# Patient Record
Sex: Female | Born: 1940
Health system: Southern US, Community
[De-identification: ages and names within clinical notes are randomized; demographics above are authoritative.]

## PROBLEM LIST (undated history)

## (undated) DIAGNOSIS — E785 Hyperlipidemia, unspecified: Secondary | ICD-10-CM

## (undated) DIAGNOSIS — K5790 Diverticulosis of intestine, part unspecified, without perforation or abscess without bleeding: Secondary | ICD-10-CM

## (undated) DIAGNOSIS — J309 Allergic rhinitis, unspecified: Secondary | ICD-10-CM

## (undated) DIAGNOSIS — Z800 Family history of malignant neoplasm of digestive organs: Secondary | ICD-10-CM

## (undated) DIAGNOSIS — M1611 Unilateral primary osteoarthritis, right hip: Secondary | ICD-10-CM

## (undated) DIAGNOSIS — K219 Gastro-esophageal reflux disease without esophagitis: Secondary | ICD-10-CM

## (undated) DIAGNOSIS — K449 Diaphragmatic hernia without obstruction or gangrene: Secondary | ICD-10-CM

## (undated) DIAGNOSIS — K52831 Collagenous colitis: Secondary | ICD-10-CM

## (undated) DIAGNOSIS — M1990 Unspecified osteoarthritis, unspecified site: Secondary | ICD-10-CM

## (undated) DIAGNOSIS — S72001A Fracture of unspecified part of neck of right femur, initial encounter for closed fracture: Secondary | ICD-10-CM

## (undated) HISTORY — PX: LEG SURGERY: SHX1003

## (undated) HISTORY — PX: TONSILLECTOMY: SUR1361

## (undated) HISTORY — DX: Diaphragmatic hernia without obstruction or gangrene: K44.9

## (undated) HISTORY — DX: Family history of malignant neoplasm of digestive organs: Z80.0

## (undated) HISTORY — PX: CHOLECYSTECTOMY: SHX55

## (undated) HISTORY — DX: Allergic rhinitis, unspecified: J30.9

## (undated) HISTORY — DX: Hyperlipidemia, unspecified: E78.5

---

## 1998-10-10 ENCOUNTER — Other Ambulatory Visit: Admission: RE | Admit: 1998-10-10 | Discharge: 1998-10-10 | Payer: Self-pay | Admitting: Gynecology

## 1999-09-04 ENCOUNTER — Ambulatory Visit (HOSPITAL_COMMUNITY): Admission: RE | Admit: 1999-09-04 | Discharge: 1999-09-04 | Payer: Self-pay | Admitting: Gastroenterology

## 1999-10-22 ENCOUNTER — Other Ambulatory Visit: Admission: RE | Admit: 1999-10-22 | Discharge: 1999-10-22 | Payer: Self-pay | Admitting: Gynecology

## 2001-09-30 ENCOUNTER — Other Ambulatory Visit: Admission: RE | Admit: 2001-09-30 | Discharge: 2001-09-30 | Payer: Self-pay | Admitting: Gynecology

## 2002-01-19 ENCOUNTER — Encounter: Payer: Self-pay | Admitting: Orthopedic Surgery

## 2002-01-19 ENCOUNTER — Ambulatory Visit (HOSPITAL_COMMUNITY): Admission: RE | Admit: 2002-01-19 | Discharge: 2002-01-20 | Payer: Self-pay | Admitting: Orthopedic Surgery

## 2004-08-14 ENCOUNTER — Other Ambulatory Visit: Admission: RE | Admit: 2004-08-14 | Discharge: 2004-08-14 | Payer: Self-pay | Admitting: Family Medicine

## 2005-11-24 ENCOUNTER — Encounter: Admission: RE | Admit: 2005-11-24 | Discharge: 2005-11-24 | Payer: Self-pay | Admitting: Orthopedic Surgery

## 2007-08-20 ENCOUNTER — Ambulatory Visit: Payer: Self-pay | Admitting: Vascular Surgery

## 2007-10-05 ENCOUNTER — Other Ambulatory Visit: Admission: RE | Admit: 2007-10-05 | Discharge: 2007-10-05 | Payer: Self-pay | Admitting: Family Medicine

## 2010-06-24 ENCOUNTER — Encounter: Admission: RE | Admit: 2010-06-24 | Discharge: 2010-06-24 | Payer: Self-pay | Admitting: Gastroenterology

## 2010-12-24 NOTE — Procedures (Signed)
CAROTID DUPLEX EXAM   INDICATION:  Left-sided numbness in face 4 days ago as well as about 4  weeks ago.   HISTORY:  Diabetes:  No.  Cardiac:  No.  Hypertension:  No.  Smoking:  Quit.  Previous Surgery:  No.  CV History:  Asymptomatic now.  Amaurosis Fugax No, Paresthesias No, Hemiparesis No                                       RIGHT             LEFT  Brachial systolic pressure:         112               110  Brachial Doppler waveforms:         Triphasic         Triphasic  Vertebral direction of flow:        Antegrade         Antegrade  DUPLEX VELOCITIES (cm/sec)  CCA peak systolic                   110               124  ECA peak systolic                   94                80  ICA peak systolic                   99                89  ICA end diastolic                   34                36  PLAQUE MORPHOLOGY:                  Mixed             None  PLAQUE AMOUNT:                      Mild              None  PLAQUE LOCATION:                    Bifurcation       none   IMPRESSION:  1. The right ICA shows evidence of 1-19% stenosis.  2. Left ICA shows no evidence of stenosis.   ___________________________________________  Quita Skye Hart [place], M.D.   AS/MEDQ  D:  08/20/2007  T:  08/20/2007  Job:  [id number]   cc:   Otilio Connors. Gerri Spore, M.D.

## 2010-12-27 NOTE — Op Note (Signed)
[place]. [facility]  Patient:    Nicole Keith, Nicole Keith Visit Number: [id number] MRN: [id number]          Service Type: DSU Location: [id number] Attending Physician:  Georgena Spurling Dictated by:   Georgena Spurling, M.D. Proc. Date: 01/19/02 Admit Date:  01/19/2002 Discharge Date: 01/20/2002                             Operative Report  4PREOPERATIVE DIAGNOSIS:  Left tibial plateau fracture.  POSTOPERATIVE DIAGNOSIS:  Left tibial plateau fracture.  OPERATION:  Left tibia open reduction and internal fixation.  SURGEON:  Norton Pastel, M.D.  ASSISTANT:  Jamelle Rushing, P.A.  ANESTHESIA:  General  INDICATION FOR PROCEDURE:  The patient is four days status post a fall and she was diagnosed with a tibial plateau fracture in [facility] ER and sent to me in the office yesterday.  She was placed on the elective schedule for today after informed consent was obtained.  DESCRIPTION OF PROCEDURE:  The patient was placed supine, administered general endotracheal anesthesia.  The left lower extremity was prepped and draped in the usual sterile fashion.  A Hockey stick incision was made approaching the lateral proximal tibia.  We developed a thick soft tissue flap going down through the anterior compartment musculature and dissecting posteriorly.  We then laid a six hole plate that was precontoured plate from [employer] along the lateral aspect of the tibial plateau and used a bone reduction clamp to clamp and give [place] compression.  We then placed three screws approximately 1.5 cm below the articular surface after verifying anatomic reduction in the AP and lateral planes.  We then placed a single metaphyseal screw and three screws in the shaft.  We had anatomic reduction.  We were very pleased with our x-rays. We then irrigated the wound and closed with interrupted 0 Vicryl and interrupted 2-0 Vicryl and skin staples.  I then aspirated the knee joint and filled it  with 15 cc of 0.25% Marcaine mixture and then dressed with Adaptic 4 x 4s, sterile Webril and Ace wrap.  The patient tolerated the procedure well. Complications were none.  Drains were none. Dictated by:   Georgena Spurling, M.D. Attending Physician:  Georgena Spurling DD:  01/19/02 TD:  01/21/02 Job: [id number]

## 2013-01-09 ENCOUNTER — Inpatient Hospital Stay (HOSPITAL_COMMUNITY)
Admission: EM | Admit: 2013-01-09 | Discharge: 2013-01-12 | DRG: 470 | Disposition: A | Discharge status: Skilled Nursing Facility | Payer: Medicare Other | Attending: Internal Medicine | Admitting: Internal Medicine

## 2013-01-09 ENCOUNTER — Emergency Department (HOSPITAL_COMMUNITY): Payer: Medicare Other

## 2013-01-09 ENCOUNTER — Encounter (HOSPITAL_COMMUNITY): Payer: Self-pay

## 2013-01-09 DIAGNOSIS — J309 Allergic rhinitis, unspecified: Secondary | ICD-10-CM | POA: Diagnosis present

## 2013-01-09 DIAGNOSIS — E871 Hypo-osmolality and hyponatremia: Secondary | ICD-10-CM | POA: Diagnosis not present

## 2013-01-09 DIAGNOSIS — Z881 Allergy status to other antibiotic agents status: Secondary | ICD-10-CM

## 2013-01-09 DIAGNOSIS — S72001F Fracture of unspecified part of neck of right femur, subsequent encounter for open fracture type IIIA, IIIB, or IIIC with routine healing: Secondary | ICD-10-CM

## 2013-01-09 DIAGNOSIS — Z8249 Family history of ischemic heart disease and other diseases of the circulatory system: Secondary | ICD-10-CM

## 2013-01-09 DIAGNOSIS — D62 Acute posthemorrhagic anemia: Secondary | ICD-10-CM | POA: Diagnosis not present

## 2013-01-09 DIAGNOSIS — Z888 Allergy status to other drugs, medicaments and biological substances status: Secondary | ICD-10-CM

## 2013-01-09 DIAGNOSIS — M1611 Unilateral primary osteoarthritis, right hip: Secondary | ICD-10-CM | POA: Diagnosis present

## 2013-01-09 DIAGNOSIS — D72829 Elevated white blood cell count, unspecified: Secondary | ICD-10-CM | POA: Diagnosis not present

## 2013-01-09 DIAGNOSIS — M1610 Unilateral primary osteoarthritis, unspecified hip: Secondary | ICD-10-CM | POA: Diagnosis present

## 2013-01-09 DIAGNOSIS — S72033A Displaced midcervical fracture of unspecified femur, initial encounter for closed fracture: Principal | ICD-10-CM | POA: Diagnosis present

## 2013-01-09 DIAGNOSIS — W010XXA Fall on same level from slipping, tripping and stumbling without subsequent striking against object, initial encounter: Secondary | ICD-10-CM | POA: Diagnosis present

## 2013-01-09 DIAGNOSIS — S72001A Fracture of unspecified part of neck of right femur, initial encounter for closed fracture: Secondary | ICD-10-CM

## 2013-01-09 DIAGNOSIS — Z880 Allergy status to penicillin: Secondary | ICD-10-CM

## 2013-01-09 DIAGNOSIS — Z01810 Encounter for preprocedural cardiovascular examination: Secondary | ICD-10-CM

## 2013-01-09 DIAGNOSIS — K5900 Constipation, unspecified: Secondary | ICD-10-CM | POA: Diagnosis not present

## 2013-01-09 DIAGNOSIS — Z7901 Long term (current) use of anticoagulants: Secondary | ICD-10-CM

## 2013-01-09 DIAGNOSIS — Z87891 Personal history of nicotine dependence: Secondary | ICD-10-CM

## 2013-01-09 DIAGNOSIS — M169 Osteoarthritis of hip, unspecified: Secondary | ICD-10-CM | POA: Diagnosis present

## 2013-01-09 DIAGNOSIS — Z7982 Long term (current) use of aspirin: Secondary | ICD-10-CM

## 2013-01-09 HISTORY — DX: Fracture of unspecified part of neck of right femur, initial encounter for closed fracture: S72.001A

## 2013-01-09 HISTORY — DX: Collagenous colitis: K52.831

## 2013-01-09 HISTORY — DX: Diverticulosis of intestine, part unspecified, without perforation or abscess without bleeding: K57.90

## 2013-01-09 HISTORY — DX: Gastro-esophageal reflux disease without esophagitis: K21.9

## 2013-01-09 HISTORY — DX: Unspecified osteoarthritis, unspecified site: M19.90

## 2013-01-09 HISTORY — DX: Unilateral primary osteoarthritis, right hip: M16.11

## 2013-01-09 LAB — URINALYSIS, ROUTINE W REFLEX MICROSCOPIC
Bilirubin Urine: NEGATIVE
Glucose, UA: NEGATIVE mg/dL
Hgb urine dipstick: NEGATIVE
Ketones, ur: NEGATIVE mg/dL
Leukocytes, UA: NEGATIVE
Nitrite: NEGATIVE
Protein, ur: NEGATIVE mg/dL
Specific Gravity, Urine: 1.012 (ref 1.005–1.030)
Urobilinogen, UA: 0.2 mg/dL (ref 0.0–1.0)
pH: 7 (ref 5.0–8.0)

## 2013-01-09 LAB — CBC
HCT: 38.8 % (ref 36.0–46.0)
Hemoglobin: 13.1 g/dL (ref 12.0–15.0)
MCH: 31.3 pg (ref 26.0–34.0)
MCHC: 33.8 g/dL (ref 30.0–36.0)
MCV: 92.8 fL (ref 78.0–100.0)
Platelets: 318 10*3/uL (ref 150–400)
RBC: 4.18 MIL/uL (ref 3.87–5.11)
RDW: 13.5 % (ref 11.5–15.5)
WBC: 15.8 10*3/uL — ABNORMAL HIGH (ref 4.0–10.5)

## 2013-01-09 LAB — BASIC METABOLIC PANEL
BUN: 12 mg/dL (ref 6–23)
CO2: 24 mEq/L (ref 19–32)
Calcium: 9 mg/dL (ref 8.4–10.5)
Chloride: 98 mEq/L (ref 96–112)
Creatinine, Ser: 0.63 mg/dL (ref 0.50–1.10)
GFR calc Af Amer: >90 mL/min (ref >90)
GFR calc non Af Amer: 87 mL/min — ABNORMAL LOW (ref >90)
Glucose, Bld: 177 mg/dL — ABNORMAL HIGH (ref 70–99)
Potassium: 4.2 mEq/L (ref 3.5–5.1)
Sodium: 134 mEq/L — ABNORMAL LOW (ref 135–145)

## 2013-01-09 LAB — PROTIME-INR
INR: 0.94 (ref 0.00–1.49)
Prothrombin Time: 12.5 seconds (ref 11.6–15.2)

## 2013-01-09 LAB — TYPE AND SCREEN
ABO/RH(D): A POS
Antibody Screen: NEGATIVE

## 2013-01-09 LAB — TROPONIN I: Troponin I: <0.3 ng/mL (ref <0.30)

## 2013-01-09 LAB — ABO/RH: ABO/RH(D): A POS

## 2013-01-09 MED ORDER — ONDANSETRON HCL 4 MG PO TABS: 4.0000 mg | ORAL_TABLET | Freq: Four times a day (QID) | ORAL | Status: DC | PRN

## 2013-01-09 MED ORDER — ENOXAPARIN SODIUM 40 MG/0.4ML ~~LOC~~ SOLN
40.0000 mg | SUBCUTANEOUS | Status: DC
  Administered 2013-01-09: 40 mg via SUBCUTANEOUS
  Filled 2013-01-09 (×2): qty 0.4

## 2013-01-09 MED ORDER — DOCUSATE SODIUM 100 MG PO CAPS
100.0000 mg | ORAL_CAPSULE | Freq: Two times a day (BID) | ORAL | Status: DC
  Administered 2013-01-09 – 2013-01-12 (×4): 100 mg via ORAL
  Filled 2013-01-09 (×7): qty 1

## 2013-01-09 MED ORDER — SODIUM CHLORIDE 0.9 % IJ SOLN: 3.0000 mL | Freq: Two times a day (BID) | INTRAMUSCULAR | Status: DC

## 2013-01-09 MED ORDER — SODIUM CHLORIDE 0.9 % IV SOLN: 250.0000 mL | INTRAVENOUS | Status: DC | PRN

## 2013-01-09 MED ORDER — ACETAMINOPHEN 650 MG RE SUPP
650.0000 mg | Freq: Four times a day (QID) | RECTAL | Status: DC | PRN
  Filled 2013-01-09: qty 1

## 2013-01-09 MED ORDER — FENTANYL CITRATE 0.05 MG/ML IJ SOLN
25.0000 ug | Freq: Once | INTRAMUSCULAR | Status: AC
  Administered 2013-01-09: 25 ug via INTRAVENOUS
  Filled 2013-01-09: qty 2

## 2013-01-09 MED ORDER — SODIUM CHLORIDE 0.9 % IV SOLN
INTRAVENOUS | Status: AC
  Administered 2013-01-10: 04:00:00 via INTRAVENOUS

## 2013-01-09 MED ORDER — ONDANSETRON HCL 4 MG/2ML IJ SOLN: 4.0000 mg | Freq: Four times a day (QID) | INTRAMUSCULAR | Status: DC | PRN

## 2013-01-09 MED ORDER — ACETAMINOPHEN 325 MG PO TABS: 650.0000 mg | ORAL_TABLET | Freq: Four times a day (QID) | ORAL | Status: DC | PRN

## 2013-01-09 MED ORDER — SODIUM CHLORIDE 0.9 % IJ SOLN: 3.0000 mL | INTRAMUSCULAR | Status: DC | PRN

## 2013-01-09 MED ORDER — HYDROCODONE-ACETAMINOPHEN 5-325 MG PO TABS
1.0000 | ORAL_TABLET | ORAL | Status: DC | PRN
  Administered 2013-01-09 (×2): 1 via ORAL
  Administered 2013-01-10 (×2): 2 via ORAL
  Filled 2013-01-09: qty 2
  Filled 2013-01-09: qty 1
  Filled 2013-01-09: qty 2
  Filled 2013-01-09: qty 1

## 2013-01-09 NOTE — ED Provider Notes (Signed)
History     CSN: [id number]  Arrival date & time 01/09/13  1554   First MD Initiated Contact with Patient 01/09/13 1652      Chief Complaint  Patient presents with  . hip fracture     (Consider location/radiation/quality/duration/timing/severity/associated sxs/prior treatment) The history is provided by the patient.  Nicole Keith is a 72 y.o. female hx of arthritis here with R hip pain s/p fall. She was walking out of her car and then tripped on the sidewalk and landed on the right hip. Denies any syncope or loss of consciousness or head injury. She was unable to walk afterwards and went to urgent care and had an x-ray that showed femoral neck fracture. She says she has arthritis in the hip before. She saw Dr. Sherlean Keith (ortho) previously.    Past Medical History  Diagnosis Date  . Colitis, collagenous     Past Surgical History  Procedure Laterality Date  . Leg surgery Left   . Tonsillectomy    . Cholecystectomy      History reviewed. No pertinent family history.  History  Substance Use Topics  . Smoking status: Former Smoker    Quit date: 01/10/1972  . Smokeless tobacco: Not on file  . Alcohol Use: No    OB History   Grav Para Term Preterm Abortions TAB SAB Ect Mult Living                  Review of Systems  Musculoskeletal:       R hip pain   All other systems reviewed and are negative.    Allergies  Valium; Betadine; Doxycycline; and Penicillins  Home Medications   Current Outpatient Rx  Name  Route  Sig  Dispense  Refill  . aspirin (ASPIR-81) 81 MG EC tablet   Oral   Take 81 mg by mouth daily. Swallow whole.         . Cholecalciferol (VITAMIN D) 2000 UNITS CAPS   Oral   Take 2,000 Units by mouth daily.         [name] Kitchen ibuprofen (ADVIL,MOTRIN) 200 MG tablet   Oral   Take 400 mg by mouth every 6 (six) hours as needed for pain.         . Loratadine (CLARITIN PO)   Oral   Take 10 mg by mouth daily.           BP 158/76  Pulse 82   Temp(Src) 98.6 F (37 C) (Oral)  Resp 18  Ht 5\' 7"  (1.702 m)  Wt 142 lb (64.411 kg)  BMI 22.24 kg/m2  SpO2 97%  Physical Exam  Nursing note and vitals reviewed. Constitutional: She is oriented to person, place, and time. She appears well-developed and well-nourished.  Uncomfortable   HENT:  Head: Normocephalic.  Mouth/Throat: Oropharynx is clear and moist.  Eyes: Conjunctivae are normal. Pupils are equal, round, and reactive to light.  Neck: Normal range of motion. Neck supple.  Cardiovascular: Normal rate, regular rhythm and normal heart sounds.   Pulmonary/Chest: Effort normal and breath sounds normal. No respiratory distress. She has no wheezes. She has no rales.  Abdominal: Soft. Bowel sounds are normal. She exhibits no distension. There is no tenderness. There is no rebound and no guarding.  Musculoskeletal:  Pelvis stable. R leg shortened and externally rotated. Good pulses. Able to plantar flex.   Neurological: She is alert and oriented to person, place, and time.  Skin: Skin is warm and dry.  Psychiatric: She  has a normal mood and affect. Her behavior is normal. Judgment and thought content normal.    ED Course  Procedures (including critical care time)  Labs Reviewed  CBC - Abnormal; Notable for the following:    WBC 15.8 (*)    All other components within normal limits  BASIC METABOLIC PANEL - Abnormal; Notable for the following:    Sodium 134 (*)    Glucose, Bld 177 (*)    GFR calc non Af Amer 87 (*)    All other components within normal limits  PROTIME-INR  URINALYSIS, ROUTINE W REFLEX MICROSCOPIC  TYPE AND SCREEN   Dg Chest 1 View  01/09/2013   *RADIOLOGY REPORT*  Clinical Data: Right hip pain status post fall.  History of osteoarthritis.  CHEST - 1 VIEW  Comparison: None.  Findings: The heart size and mediastinal contours are normal.  The lungs appear clear.  There is a probable EKG snap overlying the upper right chest.  No pleural effusion, pneumothorax or  acute fracture is identified.  Cholecystectomy clips are noted.  IMPRESSION: No active cardiopulmonary process.   Original Report Authenticated By: Carey Bullocks, M.D.   Dg Hip Complete Right  01/09/2013   *RADIOLOGY REPORT*  Clinical Data: Right hip pain and deformity status post fall. History of osteoarthritis.  RIGHT HIP - COMPLETE 2+ VIEW  Comparison: Radiographs 01/09/2013 and MRI 01/27/2006.  Findings: Again demonstrated is a nondisplaced subcapital fracture of the right femoral neck. There is asymmetric right hip osteoarthritis with joint space loss and osteophytes.  Mild left hip degenerative changes are noted.  There is no evidence of acute pelvic fracture or dislocation.  IMPRESSION: Unchanged subcapital fracture of the right femoral neck.   Original Report Authenticated By: Carey Bullocks, M.D.     No diagnosis found.   Date: 01/09/2013  Rate: 83  Rhythm: normal sinus rhythm  QRS Axis: normal  Intervals: normal  ST/T Wave abnormalities: normal  Conduction Disutrbances:none  Narrative Interpretation:   Old EKG Reviewed: unchanged    MDM  Nicole Keith is a 72 y.o. female here with R hip fracture. Xray confirmed R femoral neck fracture. Labs showed WBC 16. CXR nl, will get UA. I called Dr. Pecolia Ades, who wants patient NPO. Will admit to triad under Dr. Susie Cassette.          Richardean Canal, MD 01/09/13 [phone]

## 2013-01-09 NOTE — ED Notes (Addendum)
Pt was getting out of car at a restaurant stepping off a curb and fell onto RT hip.  Also has abrasion to RT palm and soreness to RT shoulder.  Went to [employer] and dx'd w/non-displaced RT neck hip fx.  + CMST to RLE.  Pt states she took 400mg  ibuprofen about 1:30pm today (approx after it happened)

## 2013-01-09 NOTE — H&P (Addendum)
[facility] History and Physical  Nicole Keith ZOX:[id number] DOB: Dec 28, 1940 DOA: 01/09/2013  Referring physician: ED PCP: No primary provider on file.   Chief Complaint: hip fracture    HPI:  72 y.o. female hx of arthritis here with R hip pain s/p fall. She was walking out of her car and then tripped on the sidewalk and landed on the right hip. Denies any syncope or loss of consciousness or head injury. She was unable to walk afterwards and went to urgent care and had an x-ray that showed femoral neck fracture. She says she has arthritis in the hip before. Pt states she took 400mg  ibuprofen about 1:30pm today (approx after it happened) She denies any hx of CAD ,chest pain,chf ,a fib. She has a history of collagenous colitis, which is currently quiencet. She has never had a stress test. She states that she is fairly active and occasionally goes hiking      Review of Systems: negative for the following  Constitutional: Denies fever, chills, diaphoresis, appetite change and fatigue.  HEENT: Denies photophobia, eye pain, redness, hearing loss, ear pain, congestion, sore throat, rhinorrhea, sneezing, mouth sores, trouble swallowing, neck pain, neck stiffness and tinnitus.  Respiratory: Denies SOB, DOE, cough, chest tightness, and wheezing.  Cardiovascular: Denies chest pain, palpitations and leg swelling.  Gastrointestinal: Denies nausea, vomiting, abdominal pain, diarrhea, constipation, blood in stool and abdominal distention.  Genitourinary: Denies dysuria, urgency, frequency, hematuria, flank pain and difficulty urinating.  Musculoskeletal: R hip pain  Denies myalgias, back pain, joint swelling, arthralgias and gait problem.  Skin: Denies pallor, rash and wound.  Neurological: Denies dizziness, seizures, syncope, weakness, light-headedness, numbness and headaches.  Hematological: Denies adenopathy. Easy bruising, personal or family bleeding history   Psychiatric/Behavioral: Denies suicidal ideation, mood changes, confusion, nervousness, sleep disturbance and agitation       Past Medical History  Diagnosis Date  . Colitis, collagenous      Past Surgical History  Procedure Laterality Date  . Leg surgery Left   . Tonsillectomy    . Cholecystectomy        Social History:  reports that she quit smoking about 41 years ago. She does not have any smokeless tobacco history on file. She reports that she does not drink alcohol or use illicit drugs.    Allergies  Allergen Reactions  . Valium (Diazepam) Other (See Comments)  . Betadine (Povidone Iodine) Rash  . Doxycycline Rash  . Penicillins Rash    History reviewed. No pertinent family history.   Prior to Admission medications   Medication Sig Start Date End Date Taking? Authorizing Provider  aspirin (ASPIR-81) 81 MG EC tablet Take 81 mg by mouth daily. Swallow whole.   Yes Historical Provider, MD  Cholecalciferol (VITAMIN D) 2000 UNITS CAPS Take 2,000 Units by mouth daily.   Yes Historical Provider, MD  ibuprofen (ADVIL,MOTRIN) 200 MG tablet Take 400 mg by mouth every 6 (six) hours as needed for pain.   Yes Historical Provider, MD  Loratadine (CLARITIN PO) Take 10 mg by mouth daily.   Yes Historical Provider, MD     Physical Exam: Filed Vitals:   01/09/13 1600  BP: 158/76  Pulse: 82  Temp: 98.6 F (37 C)  TempSrc: Oral  Resp: 18  Height: 5\' 7"  (1.702 m)  Weight: 64.411 kg (142 lb)  SpO2: 97%     Constitutional: Vital signs reviewed. Patient is a well-developed and well-nourished in no acute distress and cooperative with exam. Alert and oriented  x3.  Head: Normocephalic and atraumatic  Ear: TM normal bilaterally  Mouth: no erythema or exudates, MMM  Eyes: PERRL, EOMI, conjunctivae normal, No scleral icterus.  Neck: Supple, Trachea midline normal ROM, No JVD, mass, thyromegaly, or carotid bruit present.  Cardiovascular: RRR, S1 normal, S2 normal, no MRG,  pulses symmetric and intact bilaterally  Pulmonary/Chest: CTAB, no wheezes, rales, or rhonchi  Abdominal: Soft. Non-tender, non-distended, bowel sounds are normal, no masses, organomegaly, or guarding present.  GU: no CVA tenderness Musculoskeletal: No joint deformities, erythema, or stiffness, ROM full and no nontender Pelvis stable. R leg shortened and externally rotated. Good pulses. Able to plantar flex Ext: no edema and no cyanosis, pulses palpable bilaterally (DP and PT)  Hematology: no cervical, inginal, or axillary adenopathy.  Neurological: A&O x3, Strenght is normal and symmetric bilaterally, cranial nerve II-XII are grossly intact, no focal motor deficit, sensory intact to light touch bilaterally.  Skin: Warm, dry and intact. No rash, cyanosis, or clubbing.  Psychiatric: Normal mood and affect. speech and behavior is normal. Judgment and thought content normal. Cognition and memory are normal.       Labs on Admission:    Basic Metabolic Panel:  Recent Labs Lab 01/09/13 1630  NA 134*  K 4.2  CL 98  CO2 24  GLUCOSE 177*  BUN 12  CREATININE 0.63  CALCIUM 9.0   Liver Function Tests: No results found for this basename: AST, ALT, ALKPHOS, BILITOT, PROT, ALBUMIN,  in the last 168 hours No results found for this basename: LIPASE, AMYLASE,  in the last 168 hours No results found for this basename: AMMONIA,  in the last 168 hours CBC:  Recent Labs Lab 01/09/13 1630  WBC 15.8*  HGB 13.1  HCT 38.8  MCV 92.8  PLT 318   Cardiac Enzymes: No results found for this basename: CKTOTAL, CKMB, CKMBINDEX, TROPONINI,  in the last 168 hours  BNP (last 3 results) No results found for this basename: PROBNP,  in the last 8760 hours    CBG: No results found for this basename: GLUCAP,  in the last 168 hours  Radiological Exams on Admission: Dg Chest 1 View  01/09/2013   *RADIOLOGY REPORT*  Clinical Data: Right hip pain status post fall.  History of osteoarthritis.  CHEST - 1  VIEW  Comparison: None.  Findings: The heart size and mediastinal contours are normal.  The lungs appear clear.  There is a probable EKG snap overlying the upper right chest.  No pleural effusion, pneumothorax or acute fracture is identified.  Cholecystectomy clips are noted.  IMPRESSION: No active cardiopulmonary process.   Original Report Authenticated By: Carey Bullocks, M.D.   Dg Hip Complete Right  01/09/2013   *RADIOLOGY REPORT*  Clinical Data: Right hip pain and deformity status post fall. History of osteoarthritis.  RIGHT HIP - COMPLETE 2+ VIEW  Comparison: Radiographs 01/09/2013 and MRI 01/27/2006.  Findings: Again demonstrated is a nondisplaced subcapital fracture of the right femoral neck. There is asymmetric right hip osteoarthritis with joint space loss and osteophytes.  Mild left hip degenerative changes are noted.  There is no evidence of acute pelvic fracture or dislocation.  IMPRESSION: Unchanged subcapital fracture of the right femoral neck.   Original Report Authenticated By: Carey Bullocks, M.D.    EKG: Independently reviewed. Date: 01/09/2013  Rate: 83  Rhythm: normal sinus rhythm  QRS Axis: normal  Intervals: normal  ST/T Wave abnormalities: normal  Conduction Disutrbances:none  Narrative Interpretation:  Old EKG Reviewed: unchanged  Assessment/Plan Principal Problem:   Hip fracture Dr Madelon Lips aware  No cardiopulmonary sx to suggest any contraindications for surgery However given her age she is still at moderate risk of cardiac/ neurologic complications  She understands the risk and wants to proceed Will do a 2 d echo in am to ensure stability at baseline She needs to be transferred to [facility] in the morning per Dr. Candise Bowens request. She wants surgery to be done by Dr. Valentina Gu   Leukocytosis Negative workup in the ER Likely stress margination   Code Status:   full Family Communication: bedside Disposition Plan: admit   Time spent: 70 mins    [facility] Pager [phone]  If 7PM-7AM, please contact night-coverage [email or web address] Password [facility] 01/09/2013, 7:12 PM

## 2013-01-09 NOTE — ED Notes (Signed)
Pt has been voiding in bedpan and would like to hold off on foley catheter insertion until it is absolutely necessary.

## 2013-01-10 ENCOUNTER — Encounter (HOSPITAL_COMMUNITY)
Admission: EM | Disposition: A | Discharge status: Skilled Nursing Facility | Payer: Self-pay | Source: Home / Self Care | Attending: Internal Medicine

## 2013-01-10 ENCOUNTER — Inpatient Hospital Stay (HOSPITAL_COMMUNITY): Payer: Medicare Other

## 2013-01-10 ENCOUNTER — Encounter (HOSPITAL_COMMUNITY): Payer: Self-pay | Admitting: *Deleted

## 2013-01-10 ENCOUNTER — Inpatient Hospital Stay (HOSPITAL_COMMUNITY): Payer: Medicare Other | Admitting: Anesthesiology

## 2013-01-10 ENCOUNTER — Encounter (HOSPITAL_COMMUNITY): Payer: Self-pay | Admitting: Anesthesiology

## 2013-01-10 DIAGNOSIS — M1611 Unilateral primary osteoarthritis, right hip: Secondary | ICD-10-CM

## 2013-01-10 DIAGNOSIS — S72009A Fracture of unspecified part of neck of unspecified femur, initial encounter for closed fracture: Secondary | ICD-10-CM

## 2013-01-10 HISTORY — PX: TOTAL HIP ARTHROPLASTY: SHX124

## 2013-01-10 HISTORY — DX: Unilateral primary osteoarthritis, right hip: M16.11

## 2013-01-10 LAB — CREATININE, SERUM
Creatinine, Ser: 0.54 mg/dL (ref 0.50–1.10)
GFR calc Af Amer: >90 mL/min (ref >90)
GFR calc non Af Amer: >90 mL/min (ref >90)

## 2013-01-10 LAB — SURGICAL PCR SCREEN
MRSA, PCR: NEGATIVE
Staphylococcus aureus: POSITIVE — AB

## 2013-01-10 LAB — COMPREHENSIVE METABOLIC PANEL
ALT: 13 U/L (ref 0–35)
AST: 20 U/L (ref 0–37)
Albumin: 2.8 g/dL — ABNORMAL LOW (ref 3.5–5.2)
Alkaline Phosphatase: 60 U/L (ref 39–117)
BUN: 10 mg/dL (ref 6–23)
CO2: 25 mEq/L (ref 19–32)
Calcium: 8.3 mg/dL — ABNORMAL LOW (ref 8.4–10.5)
Chloride: 103 mEq/L (ref 96–112)
Creatinine, Ser: 0.56 mg/dL (ref 0.50–1.10)
GFR calc Af Amer: >90 mL/min (ref >90)
GFR calc non Af Amer: >90 mL/min (ref >90)
Glucose, Bld: 116 mg/dL — ABNORMAL HIGH (ref 70–99)
Potassium: 3.6 mEq/L (ref 3.5–5.1)
Sodium: 135 mEq/L (ref 135–145)
Total Bilirubin: 0.7 mg/dL (ref 0.3–1.2)
Total Protein: 7.9 g/dL (ref 6.0–8.3)

## 2013-01-10 LAB — CBC
HCT: 36.4 % (ref 36.0–46.0)
HCT: 31.8 % — ABNORMAL LOW (ref 36.0–46.0)
Hemoglobin: 12.1 g/dL (ref 12.0–15.0)
Hemoglobin: 10.7 g/dL — ABNORMAL LOW (ref 12.0–15.0)
MCH: 30.9 pg (ref 26.0–34.0)
MCH: 31.6 pg (ref 26.0–34.0)
MCHC: 33.2 g/dL (ref 30.0–36.0)
MCHC: 33.6 g/dL (ref 30.0–36.0)
MCV: 93.1 fL (ref 78.0–100.0)
MCV: 93.8 fL (ref 78.0–100.0)
Platelets: 258 10*3/uL (ref 150–400)
Platelets: 230 10*3/uL (ref 150–400)
RBC: 3.91 MIL/uL (ref 3.87–5.11)
RBC: 3.39 MIL/uL — ABNORMAL LOW (ref 3.87–5.11)
RDW: 13.6 % (ref 11.5–15.5)
RDW: 13.5 % (ref 11.5–15.5)
WBC: 11.3 10*3/uL — ABNORMAL HIGH (ref 4.0–10.5)
WBC: 14.2 10*3/uL — ABNORMAL HIGH (ref 4.0–10.5)

## 2013-01-10 LAB — TROPONIN I
Troponin I: <0.3 ng/mL (ref <0.30)
Troponin I: <0.3 ng/mL (ref <0.30)

## 2013-01-10 LAB — TYPE AND SCREEN
ABO/RH(D): A POS
Antibody Screen: NEGATIVE

## 2013-01-10 LAB — ABO/RH: ABO/RH(D): A POS

## 2013-01-10 LAB — HEMOGLOBIN A1C
Hgb A1c MFr Bld: 5.6 % (ref <5.7)
Mean Plasma Glucose: 114 mg/dL (ref <117)

## 2013-01-10 SURGERY — HEMIARTHROPLASTY, HIP, DIRECT ANTERIOR APPROACH, FOR FRACTURE
Anesthesia: General | Laterality: Right

## 2013-01-10 SURGERY — ARTHROPLASTY, HIP, TOTAL,POSTERIOR APPROACH
Anesthesia: General | Site: Hip | Laterality: Right | Wound class: Clean

## 2013-01-10 MED ORDER — ONDANSETRON HCL 4 MG/2ML IJ SOLN
INTRAMUSCULAR | Status: AC
  Filled 2013-01-10: qty 2

## 2013-01-10 MED ORDER — LACTATED RINGERS IV SOLN
INTRAVENOUS | Status: DC
  Administered 2013-01-10: 15:00:00 via INTRAVENOUS

## 2013-01-10 MED ORDER — GLYCOPYRROLATE 0.2 MG/ML IJ SOLN
INTRAMUSCULAR | Status: DC | PRN
  Administered 2013-01-10: 0.6 mg via INTRAVENOUS

## 2013-01-10 MED ORDER — ENOXAPARIN SODIUM 40 MG/0.4ML ~~LOC~~ SOLN
40.0000 mg | SUBCUTANEOUS | Status: DC
  Administered 2013-01-11 – 2013-01-12 (×2): 40 mg via SUBCUTANEOUS
  Filled 2013-01-10 (×3): qty 0.4

## 2013-01-10 MED ORDER — PHENYLEPHRINE HCL 10 MG/ML IJ SOLN
INTRAMUSCULAR | Status: DC | PRN
  Administered 2013-01-10 (×4): 80 ug via INTRAVENOUS

## 2013-01-10 MED ORDER — MORPHINE SULFATE 2 MG/ML IJ SOLN
1.0000 mg | INTRAMUSCULAR | Status: DC | PRN
  Administered 2013-01-10 (×2): 1 mg via INTRAVENOUS
  Filled 2013-01-10 (×2): qty 1

## 2013-01-10 MED ORDER — SENNA 8.6 MG PO TABS
1.0000 | ORAL_TABLET | Freq: Two times a day (BID) | ORAL | Status: DC
  Administered 2013-01-11 – 2013-01-12 (×3): 8.6 mg via ORAL
  Filled 2013-01-10 (×4): qty 1

## 2013-01-10 MED ORDER — BISACODYL 10 MG RE SUPP: 10.0000 mg | Freq: Every day | RECTAL | Status: DC | PRN

## 2013-01-10 MED ORDER — ONDANSETRON HCL 4 MG/2ML IJ SOLN
4.0000 mg | Freq: Four times a day (QID) | INTRAMUSCULAR | Status: DC | PRN
  Administered 2013-01-11: 4 mg via INTRAVENOUS
  Filled 2013-01-10: qty 2

## 2013-01-10 MED ORDER — ONDANSETRON HCL 4 MG PO TABS: 4.0000 mg | ORAL_TABLET | Freq: Four times a day (QID) | ORAL | Status: DC | PRN

## 2013-01-10 MED ORDER — LIDOCAINE HCL (CARDIAC) 20 MG/ML IV SOLN
INTRAVENOUS | Status: DC | PRN
  Administered 2013-01-10: 80 mg via INTRAVENOUS

## 2013-01-10 MED ORDER — ONDANSETRON HCL 4 MG/2ML IJ SOLN
4.0000 mg | Freq: Once | INTRAMUSCULAR | Status: AC | PRN
  Administered 2013-01-10: 4 mg via INTRAVENOUS

## 2013-01-10 MED ORDER — MUPIROCIN 2 % EX OINT
TOPICAL_OINTMENT | Freq: Two times a day (BID) | CUTANEOUS | Status: DC
  Administered 2013-01-11: 1 via NASAL
  Administered 2013-01-11 – 2013-01-12 (×2): via NASAL

## 2013-01-10 MED ORDER — SODIUM CHLORIDE 0.9 % IR SOLN
Status: DC | PRN
  Administered 2013-01-10: 2000 mL

## 2013-01-10 MED ORDER — ONDANSETRON HCL 4 MG/2ML IJ SOLN
INTRAMUSCULAR | Status: DC | PRN
  Administered 2013-01-10: 4 mg via INTRAVENOUS

## 2013-01-10 MED ORDER — LACTATED RINGERS IV SOLN
INTRAVENOUS | Status: DC | PRN
  Administered 2013-01-10 (×3): via INTRAVENOUS

## 2013-01-10 MED ORDER — PROPOFOL 10 MG/ML IV BOLUS
INTRAVENOUS | Status: DC | PRN
  Administered 2013-01-10: 100 mg via INTRAVENOUS

## 2013-01-10 MED ORDER — HYDROCODONE-ACETAMINOPHEN 10-325 MG PO TABS: 1.0000 | ORAL_TABLET | Freq: Four times a day (QID) | ORAL | Status: DC | PRN

## 2013-01-10 MED ORDER — HYDROMORPHONE HCL PF 1 MG/ML IJ SOLN
INTRAMUSCULAR | Status: AC
  Filled 2013-01-10: qty 1

## 2013-01-10 MED ORDER — NEOSTIGMINE METHYLSULFATE 1 MG/ML IJ SOLN
INTRAMUSCULAR | Status: DC | PRN
  Administered 2013-01-10: 4 mg via INTRAVENOUS

## 2013-01-10 MED ORDER — POLYETHYLENE GLYCOL 3350 17 G PO PACK: 17.0000 g | PACK | Freq: Every day | ORAL | Status: DC | PRN

## 2013-01-10 MED ORDER — FENTANYL CITRATE 0.05 MG/ML IJ SOLN
100.0000 ug | Freq: Once | INTRAMUSCULAR | Status: AC
  Administered 2013-01-10: 50 ug via INTRAVENOUS

## 2013-01-10 MED ORDER — HYDROCODONE-ACETAMINOPHEN 5-325 MG PO TABS
1.0000 | ORAL_TABLET | Freq: Four times a day (QID) | ORAL | Status: DC | PRN
  Administered 2013-01-11 – 2013-01-12 (×6): 2 via ORAL
  Filled 2013-01-10 (×7): qty 2

## 2013-01-10 MED ORDER — PHENOL 1.4 % MT LIQD: 1.0000 | OROMUCOSAL | Status: DC | PRN

## 2013-01-10 MED ORDER — MORPHINE SULFATE 2 MG/ML IJ SOLN: 0.5000 mg | INTRAMUSCULAR | Status: DC | PRN

## 2013-01-10 MED ORDER — ACETAMINOPHEN 650 MG RE SUPP: 650.0000 mg | Freq: Four times a day (QID) | RECTAL | Status: DC | PRN

## 2013-01-10 MED ORDER — ALUM & MAG HYDROXIDE-SIMETH 200-200-20 MG/5ML PO SUSP: 30.0000 mL | ORAL | Status: DC | PRN

## 2013-01-10 MED ORDER — FENTANYL CITRATE 0.05 MG/ML IJ SOLN
INTRAMUSCULAR | Status: AC
  Filled 2013-01-10: qty 2

## 2013-01-10 MED ORDER — METOCLOPRAMIDE HCL 5 MG/ML IJ SOLN: 5.0000 mg | Freq: Three times a day (TID) | INTRAMUSCULAR | Status: DC | PRN

## 2013-01-10 MED ORDER — MUPIROCIN 2 % EX OINT
TOPICAL_OINTMENT | CUTANEOUS | Status: AC
  Filled 2013-01-10: qty 22

## 2013-01-10 MED ORDER — ACETAMINOPHEN 10 MG/ML IV SOLN
1000.0000 mg | Freq: Once | INTRAVENOUS | Status: AC | PRN
  Administered 2013-01-10: 1000 mg via INTRAVENOUS

## 2013-01-10 MED ORDER — METOCLOPRAMIDE HCL 5 MG/ML IJ SOLN
5.0000 mg | Freq: Once | INTRAMUSCULAR | Status: AC
  Administered 2013-01-10: 5 mg via INTRAVENOUS

## 2013-01-10 MED ORDER — CEFAZOLIN SODIUM-DEXTROSE 2-3 GM-% IV SOLR
2.0000 g | INTRAVENOUS | Status: AC
  Administered 2013-01-10: 2 g via INTRAVENOUS

## 2013-01-10 MED ORDER — ACETAMINOPHEN 325 MG PO TABS: 650.0000 mg | ORAL_TABLET | Freq: Four times a day (QID) | ORAL | Status: DC | PRN

## 2013-01-10 MED ORDER — ROCURONIUM BROMIDE 100 MG/10ML IV SOLN
INTRAVENOUS | Status: DC | PRN
  Administered 2013-01-10: 40 mg via INTRAVENOUS
  Administered 2013-01-10: 10 mg via INTRAVENOUS

## 2013-01-10 MED ORDER — CEFAZOLIN SODIUM-DEXTROSE 2-3 GM-% IV SOLR
2.0000 g | Freq: Four times a day (QID) | INTRAVENOUS | Status: AC
  Administered 2013-01-11 (×2): 2 g via INTRAVENOUS
  Filled 2013-01-10 (×2): qty 50

## 2013-01-10 MED ORDER — MENTHOL 3 MG MT LOZG
1.0000 | LOZENGE | OROMUCOSAL | Status: DC | PRN
  Filled 2013-01-10: qty 9

## 2013-01-10 MED ORDER — SENNA-DOCUSATE SODIUM 8.6-50 MG PO TABS: 2.0000 | ORAL_TABLET | Freq: Every day | ORAL | Status: DC

## 2013-01-10 MED ORDER — FENTANYL CITRATE 0.05 MG/ML IJ SOLN
INTRAMUSCULAR | Status: DC | PRN
  Administered 2013-01-10: 50 ug via INTRAVENOUS
  Administered 2013-01-10 (×2): 100 ug via INTRAVENOUS

## 2013-01-10 MED ORDER — METOCLOPRAMIDE HCL 10 MG PO TABS: 5.0000 mg | ORAL_TABLET | Freq: Three times a day (TID) | ORAL | Status: DC | PRN

## 2013-01-10 MED ORDER — METOCLOPRAMIDE HCL 5 MG/ML IJ SOLN
INTRAMUSCULAR | Status: AC
  Filled 2013-01-10: qty 2

## 2013-01-10 MED ORDER — FLEET ENEMA 7-19 GM/118ML RE ENEM: 1.0000 | ENEMA | Freq: Once | RECTAL | Status: AC | PRN

## 2013-01-10 MED ORDER — ENOXAPARIN SODIUM 40 MG/0.4ML ~~LOC~~ SOLN: 40.0000 mg | SUBCUTANEOUS | Status: DC

## 2013-01-10 MED ORDER — CEFAZOLIN SODIUM 1-5 GM-% IV SOLN
INTRAVENOUS | Status: AC
  Filled 2013-01-10: qty 100

## 2013-01-10 MED ORDER — ACETAMINOPHEN 10 MG/ML IV SOLN
INTRAVENOUS | Status: AC
  Filled 2013-01-10: qty 100

## 2013-01-10 MED ORDER — HYDROMORPHONE HCL PF 1 MG/ML IJ SOLN
0.2500 mg | INTRAMUSCULAR | Status: DC | PRN
Start: 2013-01-10 — End: 2013-01-10
  Administered 2013-01-10: 0.5 mg via INTRAVENOUS

## 2013-01-10 MED ORDER — POTASSIUM CHLORIDE IN NACL 20-0.45 MEQ/L-% IV SOLN
INTRAVENOUS | Status: DC
  Administered 2013-01-11 (×2): via INTRAVENOUS
  Filled 2013-01-10 (×4): qty 1000

## 2013-01-10 MED ORDER — VECURONIUM BROMIDE 10 MG IV SOLR
INTRAVENOUS | Status: DC | PRN
  Administered 2013-01-10: 1 mg via INTRAVENOUS

## 2013-01-10 SURGICAL SUPPLY — 67 items
APL SKNCLS STERI-STRIP NONHPOA (GAUZE/BANDAGES/DRESSINGS)
BENZOIN TINCTURE PRP APPL 2/3 (GAUZE/BANDAGES/DRESSINGS) IMPLANT
BLADE SAW SAG 73X25 THK (BLADE) ×1
BLADE SAW SGTL 73X25 THK (BLADE) ×1 IMPLANT
BRUSH FEMORAL CANAL (MISCELLANEOUS) IMPLANT
CHLORAPREP W/TINT 26ML (MISCELLANEOUS) ×1 IMPLANT
CLOTH BEACON ORANGE TIMEOUT ST (SAFETY) ×2 IMPLANT
CLSR STERI-STRIP ANTIMIC 1/2X4 (GAUZE/BANDAGES/DRESSINGS) ×1 IMPLANT
COVER BACK TABLE 24X17X13 BIG (DRAPES) IMPLANT
COVER SURGICAL LIGHT HANDLE (MISCELLANEOUS) ×2 IMPLANT
DRAPE INCISE 23X17 IOBAN STRL (DRAPES) ×2
DRAPE INCISE 23X17 STRL (DRAPES) IMPLANT
DRAPE INCISE IOBAN 23X17 STRL (DRAPES) ×2 IMPLANT
DRAPE INCISE IOBAN 66X45 STRL (DRAPES) IMPLANT
DRAPE ORTHO SPLIT 77X108 STRL (DRAPES) ×4
DRAPE PROXIMA HALF (DRAPES) ×2 IMPLANT
DRAPE SURG ORHT 6 SPLT 77X108 (DRAPES) ×2 IMPLANT
DRAPE U-SHAPE 47X51 STRL (DRAPES) ×2 IMPLANT
DRILL BIT 5/64 (BIT) ×2 IMPLANT
DRSG MEPILEX BORDER 4X12 (GAUZE/BANDAGES/DRESSINGS) ×1 IMPLANT
DRSG MEPILEX BORDER 4X4 (GAUZE/BANDAGES/DRESSINGS) ×1 IMPLANT
DRSG MEPILEX BORDER 4X8 (GAUZE/BANDAGES/DRESSINGS) IMPLANT
DRSG PAD ABDOMINAL 8X10 ST (GAUZE/BANDAGES/DRESSINGS) ×4 IMPLANT
DURAPREP 26ML APPLICATOR (WOUND CARE) IMPLANT
ELECT CAUTERY BLADE 6.4 (BLADE) ×2 IMPLANT
ELECT REM PT RETURN 9FT ADLT (ELECTROSURGICAL) ×2
ELECTRODE REM PT RTRN 9FT ADLT (ELECTROSURGICAL) ×1 IMPLANT
GLOVE BIOGEL PI ORTHO PRO SZ8 (GLOVE) ×1
GLOVE ORTHO TXT STRL SZ7.5 (GLOVE) ×2 IMPLANT
GLOVE PI ORTHO PRO STRL SZ8 (GLOVE) ×1 IMPLANT
GLOVE SURG ORTHO 8.0 STRL STRW (GLOVE) ×4 IMPLANT
GLOVE SURG SS PI 7.0 STRL IVOR (GLOVE) ×2 IMPLANT
GOWN STRL NON-REIN LRG LVL3 (GOWN DISPOSABLE) IMPLANT
HANDPIECE INTERPULSE COAX TIP (DISPOSABLE)
HOOD PEEL AWAY FACE SHEILD DIS (HOOD) ×4 IMPLANT
KIT BASIN OR (CUSTOM PROCEDURE TRAY) ×2 IMPLANT
KIT ROOM TURNOVER OR (KITS) ×2 IMPLANT
MANIFOLD NEPTUNE II (INSTRUMENTS) ×2 IMPLANT
NDL HYPO 25GX1X1/2 BEV (NEEDLE) ×1 IMPLANT
NDL MAYO TROCAR (NEEDLE) IMPLANT
NEEDLE HYPO 25GX1X1/2 BEV (NEEDLE) ×2 IMPLANT
NEEDLE MAYO TROCAR (NEEDLE) ×2 IMPLANT
NS IRRIG 1000ML POUR BTL (IV SOLUTION) ×2 IMPLANT
PACK TOTAL JOINT (CUSTOM PROCEDURE TRAY) ×2 IMPLANT
PAD ARMBOARD 7.5X6 YLW CONV (MISCELLANEOUS) ×4 IMPLANT
PILLOW ABDUCTION HIP (SOFTGOODS) ×2 IMPLANT
PRESSURIZER FEMORAL UNIV (MISCELLANEOUS) IMPLANT
RETRIEVER SUT HEWSON (MISCELLANEOUS) ×2 IMPLANT
SET HNDPC FAN SPRY TIP SCT (DISPOSABLE) IMPLANT
SPONGE GAUZE 4X4 12PLY (GAUZE/BANDAGES/DRESSINGS) ×2 IMPLANT
SPONGE LAP 4X18 X RAY DECT (DISPOSABLE) IMPLANT
STRIP CLOSURE SKIN 1/2X4 (GAUZE/BANDAGES/DRESSINGS) ×4 IMPLANT
SUCTION FRAZIER TIP 10 FR DISP (SUCTIONS) ×2 IMPLANT
SUT FIBERWIRE #2 38 REV NDL BL (SUTURE) ×10
SUT MNCRL AB 4-0 PS2 18 (SUTURE) IMPLANT
SUT VIC AB 0 CT1 27 (SUTURE) ×2
SUT VIC AB 0 CT1 27XBRD ANBCTR (SUTURE) ×1 IMPLANT
SUT VIC AB 2-0 CT1 27 (SUTURE) ×2
SUT VIC AB 2-0 CT1 TAPERPNT 27 (SUTURE) ×1 IMPLANT
SUT VIC AB 3-0 SH 18 (SUTURE) ×2 IMPLANT
SUTURE FIBERWR#2 38 REV NDL BL (SUTURE) ×3 IMPLANT
SYR CONTROL 10ML LL (SYRINGE) ×2 IMPLANT
TOWEL OR 17X24 6PK STRL BLUE (TOWEL DISPOSABLE) ×2 IMPLANT
TOWEL OR 17X26 10 PK STRL BLUE (TOWEL DISPOSABLE) ×2 IMPLANT
TOWER CARTRIDGE SMART MIX (DISPOSABLE) IMPLANT
TRAY FOLEY CATH 14FR (SET/KITS/TRAYS/PACK) ×2 IMPLANT
WATER STERILE IRR 1000ML POUR (IV SOLUTION) ×8 IMPLANT

## 2013-01-10 NOTE — Op Note (Signed)
01/09/2013 - 01/10/2013  8:29 PM  PATIENT:  Nicole Keith   MRN: [id number]  PRE-OPERATIVE DIAGNOSIS:  right subcapital femoral neck hip fracture with pre-existing osteoarthritis  POST-OPERATIVE DIAGNOSIS:  right subcapital femoral neck hip fracture with pre-existing osteoarthritis  PROCEDURE:  Procedure(s): TOTAL HIP ARTHROPLASTY  PREOPERATIVE INDICATIONS:    [name] is an 72 y.o. female who has a diagnosis of Fracture of femoral neck, right and elected for surgical management.  She had preoperative osteoarthritis, and was told long ago that she would ultimately need hip replacement surgery, but unfortunately fell, and had a valgus femoral neck fracture that was slightly displaced.    The risks benefits and alternatives were discussed with the patient including but not limited to the risks of nonoperative treatment, versus surgical intervention including infection, bleeding, nerve injury, periprosthetic fracture, the need for revision surgery, dislocation, leg length discrepancy, blood clots, cardiopulmonary complications, morbidity, mortality, among others, and they were willing to proceed.     OPERATIVE REPORT     SURGEON:  Teryl Lucy, MD    ASSISTANT:  Janace Litten, OPA-C  (Present throughout the entire procedure,  necessary for completion of procedure in a timely manner, assisting with retraction, instrumentation, and closure)     ANESTHESIA:  General    COMPLICATIONS:  None.     COMPONENTS:  [employer] fit femur size 6 with a 36 mm +5 head ball and a gription acetabular shell size 54 with a 10 degree lipped polyethylene liner    PROCEDURE IN DETAIL:   The patient was met in the holding area and  identified.  The appropriate hip was identified and marked at the operative site.  The patient was then transported to the OR  and  placed under general anesthesia.  At that point, the patient was  placed in the lateral decubitus position with the operative side up  and  secured to the operating room table and all bony prominences padded.     The operative lower extremity was prepped from the iliac crest to the distal leg.  Sterile draping was performed.  Time out was performed prior to incision.      A routine posterolateral approach was utilized via sharp dissection  carried down to the subcutaneous tissue.  Gross bleeders were Bovie coagulated.  The iliotibial band was identified and incised along the length of the skin incision.  Self-retaining retractors were  inserted.  With the hip internally rotated, the short external rotators  were identified. The piriformis and capsule was tagged with FiberWire, and the hip capsule released in a T-type fashion.  The femoral neck was exposed, and I resected the femoral neck using the appropriate jig. This was performed at approximately a thumb's breadth above the lesser trochanter.    I then exposed the deep acetabulum, cleared out any tissue including the ligamentum teres.  A wing retractor was placed.  After adequate visualization, I excised the labrum, and then sequentially reamed.  A portion of the superior capsule was also released/excised to improve visualization. I placed the trial acetabulum, which seated nicely, and then impacted the real cup into place.  Appropriate version and inclination was confirmed clinically matching their bony anatomy, and also with the use of the jig. I did medialize slightly, although not all the way to the medial wall.  A trial polyethylene liner was placed and the wing retractor removed.    I then prepared the proximal femur using the cookie-cutter, the lateralizing reamer, and  then sequentially reamed and broached.  A trial broach, neck, and head was utilized, and I reduced the hip and it was found to have excellent stability with functional range of motion. The trial components were then removed, and the real polyethylene liner was placed with the lip directed posteriorly.  I  then impacted the real femoral prosthesis into place into the appropriate version, slightly anteverted to the normal anatomy, and I impacted the real head ball into place. The hip was then reduced and taken through functional range of motion and found to have excellent stability. Leg lengths were restored.  I then used a 2 mm drill bits to pass the FiberWire suture from the capsule and piriformis through the greater trochanter, and secured this. Excellent posterior capsular repair was achieved. I also closed the T in the capsule.  I then irrigated the hip copiously again with pulse lavage, and repaired the fascia with Vicryl, followed by Vicryl for the subcutaneous tissue, Monocryl for the skin, Steri-Strips and sterile gauze. The wounds were injected. The patient was then awakened and returned to PACU in stable and satisfactory condition. There were no complications.  She will be weightbearing as tolerated, on Lovenox for a period of 3 weeks, using hydrocodone for pain.  Teryl Lucy, MD Orthopedic Surgeon [phone]   01/10/2013 8:29 PM

## 2013-01-10 NOTE — Preoperative (Signed)
Beta Blockers   Reason not to administer Beta Blockers:Not Applicable 

## 2013-01-10 NOTE — Progress Notes (Signed)
PT Cancellation Note  Patient Details Name: Nicole Keith MRN: [id number] DOB: 08/01/1941   Cancelled Treatment:    Reason Eval/Treat Not Completed: Patient not medically ready Pt to have hip surgery [facility] at [facility].  Please re-order therapy with WB status and any precautions after surgery. Thanks!   Andria Head,KATHrine E 01/10/2013, 12:02 PM Zenovia Jarred, PT, DPT 01/10/2013 Pager: [phone]

## 2013-01-10 NOTE — Progress Notes (Signed)
Echocardiogram 2D Echocardiogram has been performed.  Nicole Keith 01/10/2013, 8:54 AM

## 2013-01-10 NOTE — Anesthesia Preprocedure Evaluation (Signed)
Anesthesia Evaluation  Patient identified by MRN, date of birth, ID band Patient awake    Reviewed: Allergy & Precautions, H&P , NPO status , Patient's Chart, lab work & pertinent test results  Airway Mallampati: II      Dental  (+) Teeth Intact and Dental Advisory Given   Pulmonary  breath sounds clear to auscultation        Cardiovascular Rhythm:Regular Rate:Normal     Neuro/Psych    GI/Hepatic   Endo/Other    Renal/GU      Musculoskeletal   Abdominal   Peds  Hematology   Anesthesia Other Findings   Reproductive/Obstetrics                           Anesthesia Physical Anesthesia Plan  ASA: II  Anesthesia Plan: General   Post-op Pain Management:    Induction: Intravenous  Airway Management Planned: Oral ETT  Additional Equipment:   Intra-op Plan:   Post-operative Plan: Extubation in OR  Informed Consent: I have reviewed the patients History and Physical, chart, labs and discussed the procedure including the risks, benefits and alternatives for the proposed anesthesia with the patient or authorized representative who has indicated his/her understanding and acceptance.   Dental advisory given  Plan Discussed with: CRNA, Surgeon and Anesthesiologist  Anesthesia Plan Comments: (R. Femoral Neck Fracture Chronic colitis  Kipp Brood, MD)        Anesthesia Quick Evaluation

## 2013-01-10 NOTE — Consult Note (Signed)
ORTHOPAEDIC CONSULTATION  REQUESTING PHYSICIAN: Marinda Elk, MD  Chief Complaint: Right hip pain  HPI: Nicole Keith is a 72 y.o. female who complains of  right hip pain after a mechanical fall onto concrete. She was getting out of a car, and tripped on the curb. The acute onset right hip pain, difficulty walking, and she was initially admitted [facility] long and then transferred to [facility] cone for surgical management. She was previously able to ambulate without any assistive devices, and lives with her husband. She does have a past history of a left leg fracture, treated surgically in the remote past by Dr. Sherlean Foot. Pain medications improve her symptoms, and activity and walking make it worse.  Past Medical History  Diagnosis Date  . Colitis, collagenous    Past Surgical History  Procedure Laterality Date  . Leg surgery Left   . Tonsillectomy    . Cholecystectomy     History   Social History  . Marital Status: Married    Spouse Name: N/A    Number of Children: N/A  . Years of Education: N/A   Social History Main Topics  . Smoking status: Former Smoker    Quit date: 01/10/1972  . Smokeless tobacco: None  . Alcohol Use: No  . Drug Use: No  . Sexually Active: None   Other Topics Concern  . None   Social History Narrative  . None   History reviewed. No pertinent family history. both mother and father died with heart disease, father died of an myocardial infarction at age 54. Mother had congestive heart failure.   Allergies  Allergen Reactions  . Valium (Diazepam) Other (See Comments)  . Betadine (Povidone Iodine) Rash  . Doxycycline Rash  . Penicillins Rash   Prior to Admission medications   Medication Sig Start Date End Date Taking? Authorizing Provider  aspirin (ASPIR-81) 81 MG EC tablet Take 81 mg by mouth daily. Swallow whole.   Yes Historical Provider, MD  Cholecalciferol (VITAMIN D) 2000 UNITS CAPS Take 2,000 Units by mouth daily.   Yes Historical  Provider, MD  ibuprofen (ADVIL,MOTRIN) 200 MG tablet Take 400 mg by mouth every 6 (six) hours as needed for pain.   Yes Historical Provider, MD  Loratadine (CLARITIN PO) Take 10 mg by mouth daily.   Yes Historical Provider, MD   Dg Chest 1 View  01/09/2013   *RADIOLOGY REPORT*  Clinical Data: Right hip pain status post fall.  History of osteoarthritis.  CHEST - 1 VIEW  Comparison: None.  Findings: The heart size and mediastinal contours are normal.  The lungs appear clear.  There is a probable EKG snap overlying the upper right chest.  No pleural effusion, pneumothorax or acute fracture is identified.  Cholecystectomy clips are noted.  IMPRESSION: No active cardiopulmonary process.   Original Report Authenticated By: Carey Bullocks, M.D.   Dg Hip Complete Right  01/09/2013   *RADIOLOGY REPORT*  Clinical Data: Right hip pain and deformity status post fall. History of osteoarthritis.  RIGHT HIP - COMPLETE 2+ VIEW  Comparison: Radiographs 01/09/2013 and MRI 01/27/2006.  Findings: Again demonstrated is a nondisplaced subcapital fracture of the right femoral neck. There is asymmetric right hip osteoarthritis with joint space loss and osteophytes.  Mild left hip degenerative changes are noted.  There is no evidence of acute pelvic fracture or dislocation.  IMPRESSION: Unchanged subcapital fracture of the right femoral neck.   Original Report Authenticated By: Carey Bullocks, M.D.    Positive ROS: All  other systems have been reviewed and were otherwise negative with the exception of those mentioned in the HPI and as above.  Physical Exam: General: Alert, no acute distress Cardiovascular: No pedal edema Respiratory: No cyanosis, no use of accessory musculature GI: No organomegaly, abdomen is soft and non-tender Skin: No lesions in the area of chief complaint Neurologic: Sensation intact distally Psychiatric: Patient is competent for consent with normal mood and affect Lymphatic: No axillary or cervical  lymphadenopathy  MUSCULOSKELETAL: Right hip has extreme pain with both active and passive motion. I did not test range of motion beyond any significant active function. EHL and FHL are firing.  Assessment: Right hip osteoarthritis with acute femoral neck fracture  Plan: This is an acute severe injury, and has risks both from a morbidity and mortality standpoint. I recommended surgical intervention, and I have recommended total hip arthroplasty due to the pre-existing osteoarthritis.  The risks benefits and alternatives were discussed with the patient including but not limited to the risks of nonoperative treatment, versus surgical intervention including infection, bleeding, nerve injury, periprosthetic fracture, the need for revision surgery, dislocation, leg length discrepancy, blood clots, cardiopulmonary complications, morbidity, mortality, among others, and they were willing to proceed.       Eulas Post, MD Cell [phone]   01/10/2013 2:09 PM

## 2013-01-10 NOTE — Progress Notes (Signed)
[facility] PROGRESS NOTE  Assessment/Plan: Hip fracture: - moderate risk of cardiac/ neurologic complications, She understands the risk and wants to proceed  - per Dr. Candise Bowens request transfer to [facility] cone.. - SW consult for SNF. - PT consult after surgery.     Code Status: full  Family Communication: bedside  Disposition Plan: admit     Consultants:  ortho  Procedures:  Hip arthroplasty  Antibiotics:  none (indicate start date, and stop date if known)  HPI/Subjective: No complains except for Hip pain.  Objective: Filed Vitals:   01/09/13 1600 01/09/13 2017 01/09/13 2208 01/10/13 0514  BP: 158/76 135/62 153/88 111/68  Pulse: 82 91 88 82  Temp: 98.6 F (37 C)  99.2 F (37.3 C) 99.8 F (37.7 C)  TempSrc: Oral  Oral Oral  Resp: 18 16 18 18   Height: 5\' 7"  (1.702 m)     Weight: 64.411 kg (142 lb)     SpO2: 97% 95% 96% 92%   No intake or output data in the 24 hours ending 01/10/13 0852 Filed Weights   01/09/13 1600  Weight: 64.411 kg (142 lb)    Exam:  General: Alert, awake, oriented x3, in no acute distress.  HEENT: No bruits, no goiter.  Heart: Regular rate and rhythm, without murmurs, rubs, gallops.  Lungs: Good air movement, bilateral air movement.  Abdomen: Soft, nontender, nondistended, positive bowel sounds.  Neuro: Grossly intact, nonfocal.   Data Reviewed: Basic Metabolic Panel:  Recent Labs Lab 01/09/13 1630 01/10/13 0629  NA 134* 135  K 4.2 3.6  CL 98 103  CO2 24 25  GLUCOSE 177* 116*  BUN 12 10  CREATININE 0.63 0.56  CALCIUM 9.0 8.3*   Liver Function Tests:  Recent Labs Lab 01/10/13 0629  AST 20  ALT 13  ALKPHOS 60  BILITOT 0.7  PROT 7.9  ALBUMIN 2.8*   No results found for this basename: LIPASE, AMYLASE,  in the last 168 hours No results found for this basename: AMMONIA,  in the last 168 hours CBC:  Recent Labs Lab 01/09/13 1630 01/10/13 0629  WBC 15.8* 11.3*  HGB 13.1 12.1  HCT 38.8 36.4  MCV  92.8 93.1  PLT 318 258   Cardiac Enzymes:  Recent Labs Lab 01/09/13 1950 01/10/13 0050 01/10/13 0629  TROPONINI <0.30 <0.30 <0.30   BNP (last 3 results) No results found for this basename: PROBNP,  in the last 8760 hours CBG: No results found for this basename: GLUCAP,  in the last 168 hours  Recent Results (from the past 240 hour(s))  SURGICAL PCR SCREEN     Status: Abnormal   Collection Time    01/10/13  4:26 AM      Result Value Range Status   MRSA, PCR NEGATIVE  NEGATIVE Final   Staphylococcus aureus POSITIVE (*) NEGATIVE Final   Comment:            The Xpert SA Assay (FDA     approved for NASAL specimens     in patients over 51 years of age),     is one component of     a comprehensive surveillance     program.  Test performance has     been validated by [employer] for patients greater     than or equal to 22 year old.     It is not intended     to diagnose infection nor to     guide or monitor treatment.  Studies: Dg Chest 1 View  01/09/2013   *RADIOLOGY REPORT*  Clinical Data: Right hip pain status post fall.  History of osteoarthritis.  CHEST - 1 VIEW  Comparison: None.  Findings: The heart size and mediastinal contours are normal.  The lungs appear clear.  There is a probable EKG snap overlying the upper right chest.  No pleural effusion, pneumothorax or acute fracture is identified.  Cholecystectomy clips are noted.  IMPRESSION: No active cardiopulmonary process.   Original Report Authenticated By: Carey Bullocks, M.D.   Dg Hip Complete Right  01/09/2013   *RADIOLOGY REPORT*  Clinical Data: Right hip pain and deformity status post fall. History of osteoarthritis.  RIGHT HIP - COMPLETE 2+ VIEW  Comparison: Radiographs 01/09/2013 and MRI 01/27/2006.  Findings: Again demonstrated is a nondisplaced subcapital fracture of the right femoral neck. There is asymmetric right hip osteoarthritis with joint space loss and osteophytes.  Mild left hip degenerative  changes are noted.  There is no evidence of acute pelvic fracture or dislocation.  IMPRESSION: Unchanged subcapital fracture of the right femoral neck.   Original Report Authenticated By: Carey Bullocks, M.D.    Scheduled Meds: . sodium chloride   Intravenous STAT  . docusate sodium  100 mg Oral BID  . enoxaparin (LOVENOX) injection  40 mg Subcutaneous Q24H  . sodium chloride  3 mL Intravenous Q12H  . sodium chloride  3 mL Intravenous Q12H   Continuous Infusions:    Marinda Elk  [facility] Pager [phone]. If 8PM-8AM, please contact night-coverage at [email or web address], password [facility] 01/10/2013, 8:52 AM  LOS: 1 day

## 2013-01-10 NOTE — Anesthesia Postprocedure Evaluation (Signed)
  Anesthesia Post-op Note  Patient: Nicole Keith  Procedure(s) Performed: Procedure(s): TOTAL HIP ARTHROPLASTY (Right)  Patient Location: PACU  Anesthesia Type:General  Level of Consciousness: awake  Airway and Oxygen Therapy: Patient Spontanous Breathing  Post-op Pain: mild  Post-op Assessment: Post-op Vital signs reviewed  Post-op Vital Signs: Reviewed  Complications: No apparent anesthesia complications

## 2013-01-10 NOTE — Progress Notes (Signed)
Dr. Madelon Lips has requested my assistance in the care of Nicole Keith.  She has requested Dr. Sherlean Foot, and I have spoken with him, but he is not available to assist with operative intervention based on scheduling constraints.  I have spoken with Nicole Keith, and recommended total hip arthroplasty, planned for later to day to be performed by myself at [facility], as long as she has been optimized medically.  Will review the R/B/A with her in more detail when she arrives here.  She has again requested that if Dr. Sherlean Foot could possibly do a tomorrow, then this would be her preference, and I will leave this to Dr. Sherlean Foot to discuss with her.  Possibly surgery later today with me, or on a delayed basis with Dr. Sherlean Foot.   Full consult to follow.  Eulas Post, MD Cell: [phone]

## 2013-01-10 NOTE — Transfer of Care (Signed)
Immediate Anesthesia Transfer of Care Note  Patient: Nicole Keith  Procedure(s) Performed: Procedure(s): TOTAL HIP ARTHROPLASTY (Right)  Patient Location: PACU  Anesthesia Type:General  Level of Consciousness: awake and patient cooperative  Airway & Oxygen Therapy: Patient Spontanous Breathing and Patient connected to nasal cannula oxygen  Post-op Assessment: Report given to PACU RN and Post -op Vital signs reviewed and stable  Post vital signs: Reviewed and stable  Complications: No apparent anesthesia complications

## 2013-01-11 ENCOUNTER — Encounter (HOSPITAL_COMMUNITY): Payer: Self-pay | Admitting: Orthopedic Surgery

## 2013-01-11 DIAGNOSIS — E871 Hypo-osmolality and hyponatremia: Secondary | ICD-10-CM

## 2013-01-11 LAB — BASIC METABOLIC PANEL
BUN: 9 mg/dL (ref 6–23)
CO2: 24 mEq/L (ref 19–32)
Calcium: 7.7 mg/dL — ABNORMAL LOW (ref 8.4–10.5)
Chloride: 101 mEq/L (ref 96–112)
Creatinine, Ser: 0.56 mg/dL (ref 0.50–1.10)
GFR calc Af Amer: >90 mL/min (ref >90)
GFR calc non Af Amer: >90 mL/min (ref >90)
Glucose, Bld: 147 mg/dL — ABNORMAL HIGH (ref 70–99)
Potassium: 4.4 mEq/L (ref 3.5–5.1)
Sodium: 132 mEq/L — ABNORMAL LOW (ref 135–145)

## 2013-01-11 LAB — CBC
HCT: 30.3 % — ABNORMAL LOW (ref 36.0–46.0)
Hemoglobin: 10.1 g/dL — ABNORMAL LOW (ref 12.0–15.0)
MCH: 31.2 pg (ref 26.0–34.0)
MCHC: 33.3 g/dL (ref 30.0–36.0)
MCV: 93.5 fL (ref 78.0–100.0)
Platelets: 235 10*3/uL (ref 150–400)
RBC: 3.24 MIL/uL — ABNORMAL LOW (ref 3.87–5.11)
RDW: 13.5 % (ref 11.5–15.5)
WBC: 10 10*3/uL (ref 4.0–10.5)

## 2013-01-11 MED ORDER — SODIUM CHLORIDE 0.9 % IV SOLN
INTRAVENOUS | Status: DC
  Administered 2013-01-12: 02:00:00 via INTRAVENOUS

## 2013-01-11 NOTE — Clinical Social Work Placement (Deleted)
Clinical Social Work Department  CLINICAL SOCIAL WORK PLACEMENT NOTE  01/11/2013  Patient: Nicole Keith Account Number: [id number]  Admit date: 01/11/2013  Clinical Social Worker: Sabino Niemann, MSW Date/time: 01/11/2013 3:30 AM  Clinical Social Work is seeking post-discharge placement for this patient at the following level of care: SKILLED NURSING (*CSW will update this form in Epic as items are completed)  01/11/2013 Patient/family provided with [facility] Department of Clinical Social Work's list of facilities offering this level of care within the geographic area requested by the patient (or if unable, by the patient's family).  01/11/2013 Patient/family informed of their freedom to choose among providers that offer the needed level of care, that participate in Medicare, Medicaid or managed care program needed by the patient, have an available bed and are willing to accept the patient.  6/3/2014Patient/family informed of [facility]' ownership interest in [facility], as well as of the fact that they are under no obligation to receive care at this facility.  PASARR submitted to EDS on   PASARR number received from EDS on  FL2 transmitted to all facilities in geographic area requested by pt/family on 01/11/2013  FL2 transmitted to all facilities within larger geographic area on  Patient informed that his/her managed care company has contracts with or will negotiate with certain facilities, including the following:  Patient/family informed of bed offers received:  Patient chooses bed at   Physician recommends and patient chooses bed at  Patient to be transferred to on  Patient to be transferred to facility by   The following physician request were entered in Epic:  Additional Comments:

## 2013-01-11 NOTE — Progress Notes (Signed)
     Subjective:  Patient reports pain as moderate.  No complaints, doing well otherwise.  Objective:   VITALS:   Filed Vitals:   01/10/13 2229 01/11/13 0000 01/11/13 0225 01/11/13 0636  BP: 132/59  118/75 106/57  Pulse: 79   74  Temp: 98.8 F (37.1 C)  97.8 F (36.6 C) 98.7 F (37.1 C)  TempSrc:   Oral Oral  Resp: 16 18 16 16   Height:      Weight:      SpO2: 95% 96% 98% 98%    Neurologically intact Sensation intact distally Incision: scant drainage   Lab Results  Component Value Date   WBC 10.0 01/11/2013   HGB 10.1* 01/11/2013   HCT 30.3* 01/11/2013   MCV 93.5 01/11/2013   PLT 235 01/11/2013     Assessment/Plan: 1 Day Post-Op   Principal Problem:   Fracture of femoral neck, right Active Problems:   Osteoarthritis of right hip   Advance diet Up with therapy Discharge to SNF  Mild acute blood loss anemia, observe.   Nicole Keith P 01/11/2013, 10:36 AM   Teryl Lucy, MD Cell [phone]

## 2013-01-11 NOTE — Progress Notes (Addendum)
[facility] PROGRESS NOTE  Assessment/Plan: Hip fracture:  - pt was admitted to [facility] on 6/1 s/p fall with hip fracture and per Dr. Candise Bowens request transfer to [facility] 6/2 pm. - moderate risk of cardiac/ neurologic complications per rounding hospitalist on 6/2, She voiced understanding of risk and wanted to proceed  -she was taken to OR on 6/2 per Dr Angelita Ingles - she is awaiting SNF. - PT following. Hyponatremia -change ivf to NS    Code Status: full  Family Communication: bedside  Disposition Plan: admit     Consultants:  ortho  Procedures:  Hip arthroplasty  Antibiotics:  none (indicate start date, and stop date if known)  HPI/Subjective: doin well today, states she was up with PT  Objective: Filed Vitals:   01/10/13 2229 01/11/13 0000 01/11/13 0225 01/11/13 0636  BP: 132/59  118/75 106/57  Pulse: 79   74  Temp: 98.8 F (37.1 C)  97.8 F (36.6 C) 98.7 F (37.1 C)  TempSrc:   Oral Oral  Resp: 16 18 16 16   Height:      Weight:      SpO2: 95% 96% 98% 98%    Intake/Output Summary (Last 24 hours) at 01/11/13 1129 Last data filed at 01/11/13 0900  Gross per 24 hour  Intake   3395 ml  Output   1350 ml  Net   2045 ml   Filed Weights   01/09/13 1600  Weight: 64.411 kg (142 lb)    Exam:  General: Alert, awake, oriented x3, in no acute distress.  HEENT: No bruits, no goiter.  Heart: Regular rate and rhythm, without murmurs, rubs, gallops.  Lungs: Good air movement, bilateral air movement.  Abdomen: Soft, nontender, nondistended, positive bowel sounds.  Neuro: Grossly intact, nonfocal. Extremities: No cyanosis, no edema  Data Reviewed: Basic Metabolic Panel:  Recent Labs Lab 01/09/13 1630 01/10/13 0629 01/10/13 2253 01/11/13 0440  NA 134* 135  --  132*  K 4.2 3.6  --  4.4  CL 98 103  --  101  CO2 24 25  --  24  GLUCOSE 177* 116*  --  147*  BUN 12 10  --  9  CREATININE 0.63 0.56 0.54 0.56  CALCIUM 9.0 8.3*  --  7.7*   Liver  Function Tests:  Recent Labs Lab 01/10/13 0629  AST 20  ALT 13  ALKPHOS 60  BILITOT 0.7  PROT 7.9  ALBUMIN 2.8*   No results found for this basename: LIPASE, AMYLASE,  in the last 168 hours No results found for this basename: AMMONIA,  in the last 168 hours CBC:  Recent Labs Lab 01/09/13 1630 01/10/13 0629 01/10/13 2253 01/11/13 0440  WBC 15.8* 11.3* 14.2* 10.0  HGB 13.1 12.1 10.7* 10.1*  HCT 38.8 36.4 31.8* 30.3*  MCV 92.8 93.1 93.8 93.5  PLT 318 258 230 235   Cardiac Enzymes:  Recent Labs Lab 01/09/13 1950 01/10/13 0050 01/10/13 0629  TROPONINI <0.30 <0.30 <0.30   BNP (last 3 results) No results found for this basename: PROBNP,  in the last 8760 hours CBG: No results found for this basename: GLUCAP,  in the last 168 hours  Recent Results (from the past 240 hour(s))  SURGICAL PCR SCREEN     Status: Abnormal   Collection Time    01/10/13  4:26 AM      Result Value Range Status   MRSA, PCR NEGATIVE  NEGATIVE Final   Staphylococcus aureus POSITIVE (*) NEGATIVE Final   Comment:  The Xpert SA Assay (FDA     approved for NASAL specimens     in patients over 56 years of age),     is one component of     a comprehensive surveillance     program.  Test performance has     been validated by [employer] for patients greater     than or equal to 71 year old.     It is not intended     to diagnose infection nor to     guide or monitor treatment.     Studies: Dg Chest 1 View  01/09/2013   *RADIOLOGY REPORT*  Clinical Data: Right hip pain status post fall.  History of osteoarthritis.  CHEST - 1 VIEW  Comparison: None.  Findings: The heart size and mediastinal contours are normal.  The lungs appear clear.  There is a probable EKG snap overlying the upper right chest.  No pleural effusion, pneumothorax or acute fracture is identified.  Cholecystectomy clips are noted.  IMPRESSION: No active cardiopulmonary process.   Original Report Authenticated By:  Carey Bullocks, M.D.   Dg Hip Complete Right  01/09/2013   *RADIOLOGY REPORT*  Clinical Data: Right hip pain and deformity status post fall. History of osteoarthritis.  RIGHT HIP - COMPLETE 2+ VIEW  Comparison: Radiographs 01/09/2013 and MRI 01/27/2006.  Findings: Again demonstrated is a nondisplaced subcapital fracture of the right femoral neck. There is asymmetric right hip osteoarthritis with joint space loss and osteophytes.  Mild left hip degenerative changes are noted.  There is no evidence of acute pelvic fracture or dislocation.  IMPRESSION: Unchanged subcapital fracture of the right femoral neck.   Original Report Authenticated By: Carey Bullocks, M.D.   Dg Pelvis Portable  01/11/2013   *RADIOLOGY REPORT*  Clinical Data: Post hip arthroplasty  PORTABLE PELVIS,PORTABLE RIGHT HIP - 1 VIEW  Comparison: Preoperative imaging 01/09/2013  Findings: Interval placement of right total hip arthroplasty noted. No evidence for hardware failure.  No displaced pelvic fracture identified.  Normal visualized bowel gas pattern.  IMPRESSION: Expected postoperative appearance after right total hip arthroplasty.   Original Report Authenticated By: Christiana Pellant, M.D.   Dg Hip Portable 1 View Right  01/11/2013   *RADIOLOGY REPORT*  Clinical Data: Post hip arthroplasty  PORTABLE PELVIS,PORTABLE RIGHT HIP - 1 VIEW  Comparison: Preoperative imaging 01/09/2013  Findings: Interval placement of right total hip arthroplasty noted. No evidence for hardware failure.  No displaced pelvic fracture identified.  Normal visualized bowel gas pattern.  IMPRESSION: Expected postoperative appearance after right total hip arthroplasty.   Original Report Authenticated By: Christiana Pellant, M.D.    Scheduled Meds: . docusate sodium  100 mg Oral BID  . enoxaparin (LOVENOX) injection  40 mg Subcutaneous Q24H  . mupirocin ointment   Nasal BID  . senna  1 tablet Oral BID   Continuous Infusions: . 0.45 % NaCl with KCl 20 mEq / L 75 mL/hr  at 01/11/13 0343     [facility]  [facility] Pager [phone]. If 8PM-8AM, please contact night-coverage at [email or web address], password [facility] 01/11/2013, 11:29 AM  LOS: 2 days

## 2013-01-11 NOTE — Clinical Social Work Psychosocial (Signed)
Clinical Social Work Department  BRIEF PSYCHOSOCIAL ASSESSMENT  Patient: Nicole Keith  Account Number: [id number]  Admit date: 01/09/2013 Clinical Social Worker Nicole Keith, MSW Date/Time: 01/11/2013 3:30 PM Referred by: Physician Date Referred: 01/09/2013 Referred for   SNF Placement   Other Referral:  Interview type: Patient and [name]  Other interview type: PSYCHOSOCIAL DATA  Living Status:Husband Admitted from facility:  Level of care:  Primary support name: Nicole Keith,Nicole Keith Primary support relationship to patient: Husband Degree of support available:  Strong and vested  CURRENT CONCERNS  Current Concerns   Post-Acute Placement   Other Concerns:  SOCIAL WORK ASSESSMENT / PLAN  CSW met with pt and [name] re: PT recommendation for SNF.   Pt lives with her husband  CSW explained placement process and answered questions.   Pt reports [facility]  as her preference    CSW completed FL2 and initiated SNF search.     Assessment/plan status: Information/Referral to [facility]  Other assessment/ plan:  Information/referral to community resources:  SNF   PTAR  [name]/FAMILY'Keith RESPONSE TO PLAN OF CARE:  Pt  reports she is agreeable to ST SNF in order to increase strength and independence with mobility prior to returning home  Pt verbalized understanding of placement process and appreciation for CSW assist.   Nicole Keith, MSW [phone]

## 2013-01-11 NOTE — Progress Notes (Signed)
Orthopedic Tech Progress Note Patient Details:  [name] 1941/03/12 [id number] Patient already has OHF Patient ID: Nicole Keith, female   DOB: 11/08/1940, 72 y.o.   MRN: [id number]   Nicole Keith 01/11/2013, 1:08 PM

## 2013-01-11 NOTE — Evaluation (Signed)
Physical Therapy Evaluation Patient Details Name: Nicole Keith MRN: [id number] DOB: [birth year] Today's Date: 01/11/2013 Time: 1131-1210 PT Time Calculation (min): 39 min  PT Assessment / Plan / Recommendation Clinical Impression  Pt s/p R THR presents with decreased R LE strength/ROM, post op pain and post THP limiting functional mobility    PT Assessment  Patient needs continued PT services    Follow Up Recommendations  SNF    Does the patient have the potential to tolerate intense rehabilitation      Barriers to Discharge        Equipment Recommendations  None recommended by PT    Recommendations for Other Services OT consult   Frequency 7X/week    Precautions / Restrictions Precautions Precautions: Posterior Hip;Fall Precaution Booklet Issued: Yes (comment) Restrictions Weight Bearing Restrictions: No RLE Weight Bearing: Weight bearing as tolerated   Pertinent Vitals/Pain 5/10 with activity, premed, ice pack provided      Mobility  Bed Mobility Bed Mobility: Supine to Sit Supine to Sit: 3: Mod assist Details for Bed Mobility Assistance: cues for sequence and use of L LE to self assist Transfers Transfers: Sit to Stand;Stand to Sit Sit to Stand: 4: Min assist;3: Mod assist Stand to Sit: 4: Min assist;3: Mod assist Details for Transfer Assistance: cues for LE management and use of UEs to self assist Ambulation/Gait Ambulation/Gait Assistance: 4: Min assist;3: Mod assist Ambulation Distance (Feet): 8 Feet Assistive device: Rolling walker Ambulation/Gait Assistance Details: cues for sequence, posture, stride length, and position from RW Gait Pattern: Step-to pattern;Decreased stride length;Decreased stance time - right Gait velocity: slow General Gait Details: ltd by onset nausea Stairs: No    Exercises Total Joint Exercises Ankle Circles/Pumps: AROM;15 reps;Both;Supine Quad Sets: AROM;10 reps;Supine;Both Heel Slides: AAROM;15 reps;Supine;Right Hip  ABduction/ADduction: AAROM;Right;10 reps;Supine   PT Diagnosis: Difficulty walking  PT Problem List: Decreased strength;Decreased range of motion;Decreased activity tolerance;Decreased mobility;Pain;Decreased knowledge of use of DME;Decreased knowledge of precautions PT Treatment Interventions: DME instruction;Gait training;Stair training;Functional mobility training;Therapeutic activities;Therapeutic exercise;Patient/family education   PT Goals Acute Rehab PT Goals PT Goal Formulation: With patient Time For Goal Achievement: 01/18/13 Potential to Achieve Goals: Good Pt will go Supine/Side to Sit: with supervision PT Goal: Supine/Side to Sit - Progress: Goal set today Pt will go Sit to Supine/Side: with supervision PT Goal: Sit to Supine/Side - Progress: Goal set today Pt will go Sit to Stand: with supervision PT Goal: Sit to Stand - Progress: Goal set today Pt will go Stand to Sit: with supervision PT Goal: Stand to Sit - Progress: Goal set today Pt will Ambulate: 51 - 150 feet;with supervision;with rolling walker PT Goal: Ambulate - Progress: Goal set today Pt will Go Up / Down Stairs: 1-2 stairs;with min assist;with least restrictive assistive device PT Goal: Up/Down Stairs - Progress: Goal set today  Visit Information  Last PT Received On: 01/11/13 Assistance Needed: +1    Subjective Data  Subjective: I'm glad your here so I can get up Patient Stated Goal: Rehab at [facility] and home with spouse   Prior Functioning  Home Living Lives With: Spouse Available Help at Discharge: Family Type of Home: House Home Access: Stairs to enter Secretary/administrator of Steps: 2 Entrance Stairs-Rails: None Home Layout: One level Home Adaptive Equipment: Walker - rolling Prior Function Level of Independence: Independent Able to Take Stairs?: Yes Driving: Yes Vocation: Retired Musician: No difficulties    Copywriter, advertising Arousal/Alertness:  Awake/alert Behavior During Therapy: WFL for tasks assessed/performed Overall  Cognitive Status: Within Functional Limits for tasks assessed    Extremity/Trunk Assessment Right Upper Extremity Assessment RUE ROM/Strength/Tone: [facility] for tasks assessed Left Upper Extremity Assessment LUE ROM/Strength/Tone: WFL for tasks assessed Right Lower Extremity Assessment RLE ROM/Strength/Tone: Deficits RLE ROM/Strength/Tone Deficits: hip strength 2/5 with AAROM to 70 hip flex and 20 abd Left Lower Extremity Assessment LLE ROM/Strength/Tone: WFL for tasks assessed Trunk Assessment Trunk Assessment: Normal   Balance Balance Balance Assessed: Yes Static Sitting Balance Static Sitting - Balance Support: Feet supported Static Sitting - Level of Assistance: 5: Stand by assistance Static Sitting - Comment/# of Minutes: 2  End of Session PT - End of Session Equipment Utilized During Treatment: Gait belt Activity Tolerance: Patient tolerated treatment well;Other (comment) (ltd by nausea) Patient left: in chair;with call bell/phone within reach;with family/visitor present Nurse Communication: Mobility status  GP     [name] 01/11/2013, 1:12 PM

## 2013-01-11 NOTE — Progress Notes (Signed)
Utilization review complete. Desman Polak RN CCM Case Mgmt phone [phone] 

## 2013-01-11 NOTE — Progress Notes (Signed)
Physical Therapy Treatment Patient Details Name: Nicole Keith MRN: [id number] DOB: Dec 02, 1940 Today's Date: 01/11/2013 Time: 1191-4782 PT Time Calculation (min): 33 min  PT Assessment / Plan / Recommendation Comments on Treatment Session       Follow Up Recommendations  SNF     Does the patient have the potential to tolerate intense rehabilitation     Barriers to Discharge        Equipment Recommendations  None recommended by PT    Recommendations for Other Services OT consult  Frequency 7X/week   Plan Discharge plan remains appropriate    Precautions / Restrictions Precautions Precautions: Posterior Hip;Fall Precaution Booklet Issued: Yes (comment) Precaution Comments: Pt recalls all THP without cues Restrictions Weight Bearing Restrictions: No RLE Weight Bearing: Weight bearing as tolerated   Pertinent Vitals/Pain 3/10; premed    Mobility  Bed Mobility Bed Mobility: Supine to Sit;Sit to Supine Supine to Sit: 4: Min assist Sit to Supine: 4: Min assist Details for Bed Mobility Assistance: cues for sequence and use of L LE to self assist Transfers Transfers: Sit to Stand;Stand to Sit Sit to Stand: 4: Min assist;With upper extremity assist;With armrests;From elevated surface;From bed;From chair/3-in-1 Stand to Sit: 4: Min assist;With upper extremity assist;To elevated surface;To bed;To chair/3-in-1 Details for Transfer Assistance: cues for LE management and use of UEs to self assist Ambulation/Gait Ambulation/Gait Assistance: 4: Min assist Ambulation Distance (Feet): 55 Feet (55' twice, and 20') Assistive device: Rolling walker Ambulation/Gait Assistance Details: cues for posture, sequence, position from RW, stride length and ER on R Gait Pattern: Step-to pattern;Decreased stride length;Decreased stance time - right;Step-through pattern Gait velocity: slow    Exercises     PT Diagnosis:    PT Problem List:   PT Treatment Interventions:     PT Goals Acute  Rehab PT Goals PT Goal Formulation: With patient Time For Goal Achievement: 01/18/13 Potential to Achieve Goals: Good Pt will go Supine/Side to Sit: with supervision PT Goal: Supine/Side to Sit - Progress: Progressing toward goal Pt will go Sit to Supine/Side: with supervision PT Goal: Sit to Supine/Side - Progress: Progressing toward goal Pt will go Sit to Stand: with supervision PT Goal: Sit to Stand - Progress: Progressing toward goal Pt will go Stand to Sit: with supervision PT Goal: Stand to Sit - Progress: Progressing toward goal Pt will Ambulate: 51 - 150 feet;with supervision;with rolling walker PT Goal: Ambulate - Progress: Progressing toward goal Pt will Go Up / Down Stairs: 1-2 stairs;with min assist;with least restrictive assistive device PT Goal: Up/Down Stairs - Progress: Goal set today  Visit Information  Last PT Received On: 01/11/13 Assistance Needed: +1    Subjective Data  Subjective: I am doing so much better than this morning Patient Stated Goal: Rehab at [facility] and home with spouse   Cognition  Cognition Arousal/Alertness: Awake/alert Behavior During Therapy: WFL for tasks assessed/performed Overall Cognitive Status: Within Functional Limits for tasks assessed    Balance     End of Session PT - End of Session Equipment Utilized During Treatment: Gait belt Activity Tolerance: Patient tolerated treatment well Patient left: in bed;with call bell/phone within reach;with family/visitor present Nurse Communication: Mobility status   GP     Kester Stimpson 01/11/2013, 5:16 PM

## 2013-01-11 NOTE — Clinical Social Work Placement (Addendum)
Clinical Social Work Department  CLINICAL SOCIAL WORK PLACEMENT NOTE  01/11/2013  Patient: Nicole Keith Account Number: [id number]  Admit date: 01/11/2013  Clinical Social Worker: Sabino Niemann, MSW Date/time: 01/11/2013 3:30 PM Clinical Social Work is seeking post-discharge placement for this patient at the following level of care: SKILLED NURSING (*CSW will update this form in Epic as items are completed)  01/11/2013 Patient/family provided with [facility] Department of Clinical Social Work's list of facilities offering this level of care within the geographic area requested by the patient (or if unable, by the patient's family).  01/11/2013 Patient/family informed of their freedom to choose among providers that offer the needed level of care, that participate in Medicare, Medicaid or managed care program needed by the patient, have an available bed and are willing to accept the patient.  6/3/2014Patient/family informed of [facility]' ownership interest in [facility], as well as of the fact that they are under no obligation to receive care at this facility.  PASARR submitted to EDS on 01/12/2013 PASARR number received from EDS on 01/12/2013 FL2 transmitted to all facilities in geographic area requested by pt/family on 01/11/2013  FL2 transmitted to all facilities within larger geographic area on  Patient informed that his/her managed care company has contracts with or will negotiate with certain facilities, including the following:  Patient/family informed of bed offers received:  Patient chooses bed at [facility] Physician recommends and patient chooses bed at  Patient to be transferred to on 01/12/2013 Patient to be transferred to facility by [facility] The following physician request were entered in Epic:  Additional Comments:

## 2013-01-12 DIAGNOSIS — D62 Acute posthemorrhagic anemia: Secondary | ICD-10-CM

## 2013-01-12 LAB — CBC
HCT: 26.2 % — ABNORMAL LOW (ref 36.0–46.0)
Hemoglobin: 8.8 g/dL — ABNORMAL LOW (ref 12.0–15.0)
MCH: 31.7 pg (ref 26.0–34.0)
MCHC: 33.6 g/dL (ref 30.0–36.0)
MCV: 94.2 fL (ref 78.0–100.0)
Platelets: 227 10*3/uL (ref 150–400)
RBC: 2.78 MIL/uL — ABNORMAL LOW (ref 3.87–5.11)
RDW: 13.8 % (ref 11.5–15.5)
WBC: 9.1 10*3/uL (ref 4.0–10.5)

## 2013-01-12 LAB — BASIC METABOLIC PANEL
BUN: 6 mg/dL (ref 6–23)
CO2: 27 mEq/L (ref 19–32)
Calcium: 8 mg/dL — ABNORMAL LOW (ref 8.4–10.5)
Chloride: 107 mEq/L (ref 96–112)
Creatinine, Ser: 0.61 mg/dL (ref 0.50–1.10)
GFR calc Af Amer: >90 mL/min (ref >90)
GFR calc non Af Amer: 88 mL/min — ABNORMAL LOW (ref >90)
Glucose, Bld: 116 mg/dL — ABNORMAL HIGH (ref 70–99)
Potassium: 4.1 mEq/L (ref 3.5–5.1)
Sodium: 137 mEq/L (ref 135–145)

## 2013-01-12 MED ORDER — WHITE PETROLATUM GEL
Status: AC
  Administered 2013-01-12: 03:00:00
  Filled 2013-01-12: qty 5

## 2013-01-12 MED ORDER — FERROUS FUMARATE 325 (106 FE) MG PO TABS: 1.0000 | ORAL_TABLET | Freq: Two times a day (BID) | ORAL | Status: DC

## 2013-01-12 MED ORDER — POLYETHYLENE GLYCOL 3350 17 G PO PACK: 17.0000 g | PACK | Freq: Every day | ORAL | Status: DC | PRN

## 2013-01-12 MED ORDER — DSS 100 MG PO CAPS: 100.0000 mg | ORAL_CAPSULE | Freq: Two times a day (BID) | ORAL | Status: DC

## 2013-01-12 NOTE — Discharge Summary (Signed)
Physician Discharge Summary  Nicole Keith EAV:[id number] DOB: February 24, 1941 DOA: 01/09/2013  PCP: No primary provider on file.  Admit date: 01/09/2013 Discharge date: 01/12/2013  Time spent: >30  minutes  Recommendations for Outpatient Follow-up:  CBC in 5-7 days to follow Hgb level  Discharge Diagnoses:  Principal Problem:   Fracture of femoral neck, right Active Problems:   Osteoarthritis of right hip ABLA Constipation Leukocytosis  Discharge Condition: stable and improved. Will be discharge to [facility] for physical rehab.  Diet recommendation: regular diet  Filed Weights   01/09/13 1600  Weight: 64.411 kg (142 lb)    History of present illness:  72 y.o. female hx of arthritis here with R hip pain s/p fall. She was walking out of her car and then tripped on the sidewalk and landed on the right hip. Denies any syncope or loss of consciousness or head injury. She was unable to walk afterwards and went to urgent care and had an x-ray that showed femoral neck fracture. She says she has arthritis in the hip before. Pt states she took 400mg  ibuprofen about 1:30pm today (approx after it happened)  She denies any hx of CAD ,chest pain,chf ,a fib. She has a history of collagenous colitis, which is currently quiencet.   Hospital Course:  1-Femoral neck fracture and right hip osteoarthritis: s/p right hip arthroplasty. Doing well and with moderate pain (controlled with current pain medication regimen) -will be discharge to SNF for physical rehab -Orthopedic service to follow as an outpatient -Weight bearing as tolerated -Lovenox for DVT prophylaxis  2-ABLA: acute blood loss anemia. Patient Hgb level at discharge 8.8; asymptomatic. -will start ferrous sulfate BID -CBC in 5-7 days to follow Hgb level  3-Leukocytosis: due to stress demargination with fracture. Resolved and back to normal at discharge  4-Hyponatremia: resolved with IVF's  5-Constipation: continue bowel  regimen; due to narcotics medications  6-allergic rhinitis: continue claritin   Procedures:  Right total hip arthroplasty  Consultations:  Orthopedic service  Discharge Exam: Filed Vitals:   01/11/13 2000 01/11/13 2013 01/12/13 0000 01/12/13 0400  BP:  110/55    Pulse:  88    Temp:  97.5 F (36.4 C)    TempSrc:      Resp: 16 18 18 18   Height:      Weight:      SpO2: 94% 98%      General: AAOX3, no acute distress, afebrile Cardiovascular: S1 and S2, no rubs or gallops Respiratory: CTA bilaterally Abdomen: Soft, NT, ND; positive BS Extremities:intact sensation, no cyanosis; dressing clean/dry and intact; no drainage  Discharge Instructions  Discharge Orders   Future Orders Complete By Expires     Discharge instructions  As directed     Comments:      Take medications as prescribed Follow with PCP in 15 days Follow rehabilitation and wound instructions as specified by orthopedic service Follow with orthopedic service as instructed    Posterior total hip precautions  As directed     Weight bearing as tolerated  As directed         Medication List    STOP taking these medications       ibuprofen 200 MG tablet  Commonly known as:  ADVIL,MOTRIN      TAKE these medications       ASPIR-81 81 MG EC tablet  Generic drug:  aspirin  Take 81 mg by mouth daily. Swallow whole.     CLARITIN PO  Take 10 mg  by mouth daily.     DSS 100 MG Caps  Take 100 mg by mouth 2 (two) times daily.     enoxaparin 40 MG/0.4ML injection  Commonly known as:  LOVENOX  Inject 0.4 mLs (40 mg total) into the skin daily.     ferrous fumarate 325 (106 FE) MG Tabs  Commonly known as:  HEMOCYTE - 106 mg FE  Take 1 tablet (106 mg of iron total) by mouth 2 (two) times daily.     HYDROcodone-acetaminophen 10-325 MG per tablet  Commonly known as:  NORCO  Take 1-2 tablets by mouth every 6 (six) hours as needed for pain.     polyethylene glycol packet  Commonly known as:  MIRALAX /  GLYCOLAX  Take 17 g by mouth daily as needed (constipation; hold for diarrhea).     sennosides-docusate sodium 8.6-50 MG tablet  Commonly known as:  SENOKOT-S  Take 2 tablets by mouth daily.     Vitamin D 2000 UNITS Caps  Take 2,000 Units by mouth daily.       Allergies  Allergen Reactions  . Valium (Diazepam) Other (See Comments)  . Betadine (Povidone Iodine) Rash  . Doxycycline Rash  . Penicillins Rash    Ancef was OK.       Follow-up Information   Follow up with Eulas Post, MD. Schedule an appointment as soon as possible for a visit in 2 weeks.   Contact information:   [address] [place] [address] [phone]        The results of significant diagnostics from this hospitalization (including imaging, microbiology, ancillary and laboratory) are listed below for reference.    Significant Diagnostic Studies: Dg Chest 1 View  01/09/2013   *RADIOLOGY REPORT*  Clinical Data: Right hip pain status post fall.  History of osteoarthritis.  CHEST - 1 VIEW  Comparison: None.  Findings: The heart size and mediastinal contours are normal.  The lungs appear clear.  There is a probable EKG snap overlying the upper right chest.  No pleural effusion, pneumothorax or acute fracture is identified.  Cholecystectomy clips are noted.  IMPRESSION: No active cardiopulmonary process.   Original Report Authenticated By: Carey Bullocks, M.D.   Dg Hip Complete Right  01/09/2013   *RADIOLOGY REPORT*  Clinical Data: Right hip pain and deformity status post fall. History of osteoarthritis.  RIGHT HIP - COMPLETE 2+ VIEW  Comparison: Radiographs 01/09/2013 and MRI 01/27/2006.  Findings: Again demonstrated is a nondisplaced subcapital fracture of the right femoral neck. There is asymmetric right hip osteoarthritis with joint space loss and osteophytes.  Mild left hip degenerative changes are noted.  There is no evidence of acute pelvic fracture or dislocation.  IMPRESSION:  Unchanged subcapital fracture of the right femoral neck.   Original Report Authenticated By: Carey Bullocks, M.D.   Dg Pelvis Portable  01/11/2013   *RADIOLOGY REPORT*  Clinical Data: Post hip arthroplasty  PORTABLE PELVIS,PORTABLE RIGHT HIP - 1 VIEW  Comparison: Preoperative imaging 01/09/2013  Findings: Interval placement of right total hip arthroplasty noted. No evidence for hardware failure.  No displaced pelvic fracture identified.  Normal visualized bowel gas pattern.  IMPRESSION: Expected postoperative appearance after right total hip arthroplasty.   Original Report Authenticated By: Christiana Pellant, M.D.   Dg Hip Portable 1 View Right  01/11/2013   *RADIOLOGY REPORT*  Clinical Data: Post hip arthroplasty  PORTABLE PELVIS,PORTABLE RIGHT HIP - 1 VIEW  Comparison: Preoperative imaging 01/09/2013  Findings: Interval placement of right  total hip arthroplasty noted. No evidence for hardware failure.  No displaced pelvic fracture identified.  Normal visualized bowel gas pattern.  IMPRESSION: Expected postoperative appearance after right total hip arthroplasty.   Original Report Authenticated By: Christiana Pellant, M.D.    Microbiology: Recent Results (from the past 240 hour(s))  SURGICAL PCR SCREEN     Status: Abnormal   Collection Time    01/10/13  4:26 AM      Result Value Range Status   MRSA, PCR NEGATIVE  NEGATIVE Final   Staphylococcus aureus POSITIVE (*) NEGATIVE Final   Comment:            The Xpert SA Assay (FDA     approved for NASAL specimens     in patients over 51 years of age),     is one component of     a comprehensive surveillance     program.  Test performance has     been validated by [employer] for patients greater     than or equal to 73 year old.     It is not intended     to diagnose infection nor to     guide or monitor treatment.     Labs: Basic Metabolic Panel:  Recent Labs Lab 01/09/13 1630 01/10/13 0629 01/10/13 2253 01/11/13 0440 01/12/13 0454  NA  134* 135  --  132* 137  K 4.2 3.6  --  4.4 4.1  CL 98 103  --  101 107  CO2 24 25  --  24 27  GLUCOSE 177* 116*  --  147* 116*  BUN 12 10  --  9 6  CREATININE 0.63 0.56 0.54 0.56 0.61  CALCIUM 9.0 8.3*  --  7.7* 8.0*   Liver Function Tests:  Recent Labs Lab 01/10/13 0629  AST 20  ALT 13  ALKPHOS 60  BILITOT 0.7  PROT 7.9  ALBUMIN 2.8*   CBC:  Recent Labs Lab 01/09/13 1630 01/10/13 0629 01/10/13 2253 01/11/13 0440 01/12/13 0454  WBC 15.8* 11.3* 14.2* 10.0 9.1  HGB 13.1 12.1 10.7* 10.1* 8.8*  HCT 38.8 36.4 31.8* 30.3* 26.2*  MCV 92.8 93.1 93.8 93.5 94.2  PLT 318 258 230 235 227   Cardiac Enzymes:  Recent Labs Lab 01/09/13 1950 01/10/13 0050 01/10/13 0629  TROPONINI <0.30 <0.30 <0.30    Signed:  Wanda Rideout  [facility] 01/12/2013, 12:51 PM

## 2013-01-12 NOTE — Progress Notes (Signed)
Clinical social worker assisted with patient discharge to skilled nursing facility, [facility].  CSW addressed all family questions and concerns. CSW copied chart and added all important documents. CSW also set up patient transportation with [facility]. Clinical Social Worker will sign off for now as social work intervention is no longer needed.  Ivelise Castillo, MSW, [phone] 

## 2013-01-12 NOTE — Progress Notes (Signed)
Physical Therapy Treatment Patient Details Name: Nicole Keith MRN: [id number] DOB: November 08, 1940 Today's Date: 01/12/2013 Time: 4098-1191 PT Time Calculation (min): 59 min  PT Assessment / Plan / Recommendation Comments on Treatment Session  Pt progressing with ambulation distance, however she has very slow gait speed and tends to look down when ambulating.     Follow Up Recommendations  SNF     Does the patient have the potential to tolerate intense rehabilitation     Barriers to Discharge        Equipment Recommendations  None recommended by PT    Recommendations for Other Services    Frequency 7X/week   Plan Discharge plan remains appropriate    Precautions / Restrictions Precautions Precautions: Posterior Hip;Fall Precaution Booklet Issued: Yes (comment) Precaution Comments: Pt able to recall all but no IR when asked about THP.  Restrictions Weight Bearing Restrictions: Yes RLE Weight Bearing: Weight bearing as tolerated   Pertinent Vitals/Pain 4/10 pain, ice pack applied and end of session.     Mobility  Bed Mobility Bed Mobility: Sit to Supine Sit to Supine: 4: Min assist Details for Bed Mobility Assistance: Assist for RLE into bed with cues for technique and adjusting hips once in bed.  Transfers Transfers: Sit to Stand;Stand to Sit Sit to Stand: 4: Min guard;With upper extremity assist;With armrests;From chair/3-in-1 Stand to Sit: 4: Min guard;With upper extremity assist;To bed Details for Transfer Assistance: Min/guard for safety with cues for hand placement and LE management to maintain THP when sitting/standing.  Ambulation/Gait Ambulation/Gait Assistance: 4: Min guard;4: Min Environmental consultant (Feet): 155 Feet Assistive device: Rolling walker Ambulation/Gait Assistance Details: Cues for sequencing/technique with RW and MAX cues for maintaining upright posture throughout as pt tends to look down at feet when ambulating.  Gait Pattern: Step-to  pattern;Decreased stride length;Decreased stance time - right;Step-through pattern Gait velocity: pt with very slow gait speed.  General Gait Details: ltd by onset nausea    Exercises Total Joint Exercises Ankle Circles/Pumps: AROM;Both;20 reps Quad Sets: AROM;10 reps;Supine;Both Short Arc Quad: AROM;Right;10 reps Heel Slides: AAROM;Supine;Right;10 reps Hip ABduction/ADduction: AAROM;Right;10 reps;Supine   PT Diagnosis:    PT Problem List:   PT Treatment Interventions:     PT Goals Acute Rehab PT Goals PT Goal Formulation: With patient Time For Goal Achievement: 01/18/13 Potential to Achieve Goals: Good Pt will go Sit to Supine/Side: with supervision PT Goal: Sit to Supine/Side - Progress: Progressing toward goal Pt will go Sit to Stand: with supervision PT Goal: Sit to Stand - Progress: Progressing toward goal Pt will go Stand to Sit: with supervision PT Goal: Stand to Sit - Progress: Progressing toward goal Pt will Ambulate: 51 - 150 feet;with supervision;with rolling walker PT Goal: Ambulate - Progress: Progressing toward goal  Visit Information  Last PT Received On: 01/12/13 Assistance Needed: +1    Subjective Data  Subjective: Am I doing alright? Patient Stated Goal: Rehab at [facility] and home with spouse   Cognition  Cognition Arousal/Alertness: Awake/alert Behavior During Therapy: WFL for tasks assessed/performed Overall Cognitive Status: Within Functional Limits for tasks assessed    Balance     End of Session PT - End of Session Activity Tolerance: Patient tolerated treatment well Patient left: in bed;with call bell/phone within reach;with family/visitor present Nurse Communication: Mobility status   GP     Vista Deck 01/12/2013, 12:52 PM

## 2013-01-12 NOTE — Progress Notes (Signed)
Patient ID: MANUEL LAWHEAD, female   DOB: 10-24-1940, 72 y.o.   MRN: [id number]     Subjective:  Patient reports pain as mild.  Patient states that she is doing much better.  Objective:   VITALS:   Filed Vitals:   01/11/13 2000 01/11/13 2013 01/12/13 0000 01/12/13 0400  BP:  110/55    Pulse:  88    Temp:  97.5 F (36.4 C)    TempSrc:      Resp: 16 18 18 18   Height:      Weight:      SpO2: 94% 98%      ABD soft Sensation intact distally Dorsiflexion/Plantar flexion intact Incision: dressing C/D/I and no drainage   Lab Results  Component Value Date   WBC 9.1 01/12/2013   HGB 8.8* 01/12/2013   HCT 26.2* 01/12/2013   MCV 94.2 01/12/2013   PLT 227 01/12/2013     Assessment/Plan: 2 Days Post-Op   Principal Problem:   Fracture of femoral neck, right Active Problems:   Osteoarthritis of right hip   Advance diet Up with therapy Discharge to SNF WBAT Dry dressing PRN Follow up in 2 weeks with Dr Jenell Milliner, [facility] 01/12/2013, 11:57 AM   Teryl Lucy, MD Cell [phone]

## 2013-01-13 ENCOUNTER — Non-Acute Institutional Stay (SKILLED_NURSING_FACILITY): Payer: Medicare Other | Admitting: Internal Medicine

## 2013-01-13 DIAGNOSIS — J309 Allergic rhinitis, unspecified: Secondary | ICD-10-CM

## 2013-01-13 DIAGNOSIS — S72001S Fracture of unspecified part of neck of right femur, sequela: Secondary | ICD-10-CM

## 2013-01-13 DIAGNOSIS — K5900 Constipation, unspecified: Secondary | ICD-10-CM

## 2013-01-13 DIAGNOSIS — D62 Acute posthemorrhagic anemia: Secondary | ICD-10-CM

## 2013-01-13 DIAGNOSIS — S72009S Fracture of unspecified part of neck of unspecified femur, sequela: Secondary | ICD-10-CM

## 2013-01-24 ENCOUNTER — Non-Acute Institutional Stay (SKILLED_NURSING_FACILITY): Payer: Medicare Other | Admitting: Adult Health

## 2013-01-24 DIAGNOSIS — D62 Acute posthemorrhagic anemia: Secondary | ICD-10-CM

## 2013-01-24 DIAGNOSIS — K5900 Constipation, unspecified: Secondary | ICD-10-CM

## 2013-01-24 DIAGNOSIS — S72001D Fracture of unspecified part of neck of right femur, subsequent encounter for closed fracture with routine healing: Secondary | ICD-10-CM

## 2013-01-24 DIAGNOSIS — S72009D Fracture of unspecified part of neck of unspecified femur, subsequent encounter for closed fracture with routine healing: Secondary | ICD-10-CM

## 2013-01-24 DIAGNOSIS — R21 Rash and other nonspecific skin eruption: Secondary | ICD-10-CM

## 2013-01-24 DIAGNOSIS — J309 Allergic rhinitis, unspecified: Secondary | ICD-10-CM

## 2013-02-03 DIAGNOSIS — J309 Allergic rhinitis, unspecified: Secondary | ICD-10-CM | POA: Insufficient documentation

## 2013-02-03 DIAGNOSIS — D62 Acute posthemorrhagic anemia: Secondary | ICD-10-CM | POA: Insufficient documentation

## 2013-02-03 DIAGNOSIS — K5900 Constipation, unspecified: Secondary | ICD-10-CM | POA: Insufficient documentation

## 2013-02-03 NOTE — Progress Notes (Signed)
Patient ID: Nicole Keith, female   DOB: 11-06-1940, 72 y.o.   MRN: [id number]        HISTORY & PHYSICAL  DATE: 01/13/2013   FACILITY: [facility]  LEVEL OF CARE: SNF (31)  ALLERGIES:  Allergies  Allergen Reactions  . Valium (Diazepam) Other (See Comments)  . Betadine (Povidone Iodine) Rash  . Doxycycline Rash  . Penicillins Rash    Ancef was OK.    CHIEF COMPLAINT:  Manage right femoral neck fracture, acute blood loss anemia, and constipation.    HISTORY OF PRESENT ILLNESS:  The patient is a 72 year-old, Caucasian female.    HIP FRACTURE: The patient had a mechanical fall and sustained a femur fracture.  Patient subsequently underwent surgical repair and tolerated the procedure well. Patient is admitted to this facility for short-term rehabilitation. Patient denies hip pain currently. No complications reported from the pain medications currently being used.    ANEMIA: Postoperatively, patient suffered acute blood loss anemia.   The anemia is unstable. The patient denies fatigue, melena or hematochezia. No complications from the medications currently being used.  Last hemoglobins are:  8.8, 10.1, 10.7.  CONSTIPATION: The constipation remains stable. No complications from the medications presently being used. Patient denies ongoing constipation, abdominal pain, nausea or vomiting.   PAST MEDICAL HISTORY :  Past Medical History  Diagnosis Date  . Colitis, collagenous   . Arthritis   . GERD (gastroesophageal reflux disease)   . Diverticular disease   . Fracture of femoral neck, right 01/09/2013  . Osteoarthritis of right hip 01/10/2013    PAST SURGICAL HISTORY: Past Surgical History  Procedure Laterality Date  . Leg surgery Left   . Tonsillectomy    . Cholecystectomy    . Total hip arthroplasty Right 01/10/2013    Procedure: TOTAL HIP ARTHROPLASTY;  Surgeon: Eulas Post, MD;  Location: [facility] OR;  Service: Orthopedics;  Laterality: Right;    SOCIAL  HISTORY:  reports that she quit smoking about 41 years ago. She does not have any smokeless tobacco history on file. She reports that she does not drink alcohol or use illicit drugs.  FAMILY HISTORY:  Family History  Problem Relation Age of Onset  . Congestive Heart Failure Mother   . Arthritis-Osteo Mother   . Colon cancer Mother   . Heart attack Father   . Rheum arthritis Father   . Colon cancer Maternal Aunt   . Colon cancer Maternal Aunt   . Hypothyroidism Sister     CURRENT MEDICATIONS: Reviewed per [facility]  REVIEW OF SYSTEMS:  See HPI otherwise 14 point ROS is negative.  PHYSICAL EXAMINATION  VS:  T 99.3       P 95      RR 20      BP 135/79      POX 98% room air        WT (Lb)  GENERAL: no acute distress, normal body habitus EYES: conjunctivae normal, sclerae normal, normal eye lids MOUTH/THROAT: lips without lesions,no lesions in the mouth,tongue is without lesions,uvula elevates in midline NECK: supple, trachea midline, no neck masses, no thyroid tenderness, no thyromegaly LYMPHATICS: no LAN in the neck, no supraclavicular LAN RESPIRATORY: breathing is even & unlabored, BS CTAB CARDIAC: RRR, no murmur,no extra heart sounds, no edema GI:  ABDOMEN: abdomen soft, normal BS, no masses, no tenderness  LIVER/SPLEEN: no hepatomegaly, no splenomegaly MUSCULOSKELETAL: HEAD: normal to inspection & palpation BACK: no kyphosis, scoliosis or spinal processes tenderness  EXTREMITIES: LEFT UPPER EXTREMITY: full range of motion, normal strength & tone RIGHT UPPER EXTREMITY:  full range of motion, normal strength & tone LEFT LOWER EXTREMITY:  full range of motion, normal strength & tone RIGHT LOWER EXTREMITY: strength intact, range of motion not tested due to surgery  PSYCHIATRIC: the patient is alert & oriented to person, affect & behavior appropriate  LABS/RADIOLOGY: Chest x-ray:  No acute disease.    Right hip x-ray, pelvic x-ray postoperatively showed expected postoperative  appearance after right total hip arthroplasty.    MRSA by PCR negative.    Staph aureus by PCR positive.    Glucose 116, otherwise BMP normal.    Albumin 2.8, otherwise liver profile normal.    Hemoglobin 8.8, MCV 94.2, otherwise CBC normal.    ASSESSMENT/PLAN:  Right femoral neck fracture.  Status post right hip arthroplasty.  Continue rehabilitation.   Acute blood loss anemia.  Continue iron.  Reassess hemoglobin level.   Constipation.  Adequately controlled.  Allergic rhinitis.  Continue Claritin.  Denies ongoing symptoms.   Check CBC and BMP.   I have reviewed patient's medical records received at admission/from hospitalization.  CPT CODE: 40981

## 2013-02-06 ENCOUNTER — Encounter: Payer: Self-pay | Admitting: Adult Health

## 2013-02-06 DIAGNOSIS — R21 Rash and other nonspecific skin eruption: Secondary | ICD-10-CM | POA: Insufficient documentation

## 2013-02-06 DIAGNOSIS — L539 Erythematous condition, unspecified: Secondary | ICD-10-CM | POA: Insufficient documentation

## 2013-02-06 NOTE — Progress Notes (Signed)
  Subjective:    Patient ID: Nicole Keith, female    DOB: Jan 06, 1941, 72 y.o.   MRN: [id number]  HPI This is a 72 year old female who is for discharge home with Home health PT. She has been admitted to [facility] on 01/12/13 from [facility] with Fracture of right femoral neck S/P Right Hip Arthroplasty. She has completed SNF rehabilitation and therapy has cleared the patient for discharge. Noted to have erythematous rashes on bilateral axilla. She reports using a new brand of deodorant recently.   Review of Systems  Constitutional: Negative.  Negative for activity change.  HENT: Negative.   Eyes: Negative.   Respiratory: Negative for chest tightness and shortness of breath.   Cardiovascular: Negative for leg swelling.  Gastrointestinal: Negative.   Endocrine: Negative.   Genitourinary: Negative.   Skin: Positive for rash.  Neurological: Negative.   Hematological: Negative for adenopathy. Does not bruise/bleed easily.  Psychiatric/Behavioral: Negative.        Objective:   Physical Exam  Nursing note and vitals reviewed. Constitutional: She is oriented to person, place, and time. She appears well-developed and well-nourished.  HENT:  Head: Normocephalic and atraumatic.  Right Ear: External ear normal.  Left Ear: External ear normal.  Nose: Nose normal.  Mouth/Throat: Oropharynx is clear and moist.  Eyes: Conjunctivae are normal. Pupils are equal, round, and reactive to light.  Neck: Normal range of motion. Neck supple. No thyromegaly present.  Cardiovascular: Normal rate, regular rhythm, normal heart sounds and intact distal pulses.   Pulmonary/Chest: Effort normal and breath sounds normal. No respiratory distress.  Abdominal: Soft. Bowel sounds are normal. She exhibits no distension. There is no tenderness.  Musculoskeletal: Normal range of motion. She exhibits no edema and no tenderness.  Neurological: She is alert and oriented to person, place, and time.  Skin:  Skin is warm and dry. Rash noted.  Erythematous rashes on bilateral armpits  Psychiatric: She has a normal mood and affect. Her behavior is normal. Judgment and thought content normal.    LABS: 01/13/13  Wbc 10.1  hgb 9.2  hct 27.3  NA 135  K 4.0  Glucose 169  BUN 7  Creatinine 0.55  Calcium 7.6    Medications reviewed per [facility]      Assessment & Plan:   Rash and other nonspecific skin eruption - Apply Hydrocortisone 1% cream to rashes on bilateral armpit BID x 2 weeks  Acute posthemorrhagic anemia - stable  Unspecified constipation - stable  Allergic rhinitis, cause unspecified - stable  Fracture of femoral neck, right S/P Right Total Hip Arthroplasty - for Home health PT   I have filled out discharge papers and written prescriptions. Patient will have Home health PT.    Total discharge time:  >30 minutes Discharge time involved coordination of the discharge process with social worker, nursing staff and therapy department. Medical justification for Home health services verified.    CPT CODE:  40981

## 2013-12-05 ENCOUNTER — Ambulatory Visit
Admission: RE | Admit: 2013-12-05 | Discharge: 2013-12-05 | Disposition: A | Discharge status: Home / Self Care | Payer: Medicare Other | Source: Ambulatory Visit | Attending: Family Medicine | Admitting: Family Medicine

## 2013-12-05 ENCOUNTER — Other Ambulatory Visit: Payer: Self-pay | Admitting: Family Medicine

## 2013-12-05 DIAGNOSIS — M542 Cervicalgia: Secondary | ICD-10-CM

## 2013-12-08 ENCOUNTER — Ambulatory Visit: Payer: Medicare Other | Attending: Family Medicine

## 2013-12-08 DIAGNOSIS — R5381 Other malaise: Secondary | ICD-10-CM | POA: Insufficient documentation

## 2013-12-08 DIAGNOSIS — M542 Cervicalgia: Secondary | ICD-10-CM | POA: Insufficient documentation

## 2013-12-08 DIAGNOSIS — IMO0001 Reserved for inherently not codable concepts without codable children: Secondary | ICD-10-CM | POA: Insufficient documentation

## 2013-12-12 ENCOUNTER — Ambulatory Visit: Payer: Medicare Other | Attending: Family Medicine | Admitting: Physical Therapy

## 2013-12-12 DIAGNOSIS — R5381 Other malaise: Secondary | ICD-10-CM | POA: Insufficient documentation

## 2013-12-12 DIAGNOSIS — M542 Cervicalgia: Secondary | ICD-10-CM | POA: Insufficient documentation

## 2013-12-12 DIAGNOSIS — IMO0001 Reserved for inherently not codable concepts without codable children: Secondary | ICD-10-CM | POA: Insufficient documentation

## 2013-12-13 ENCOUNTER — Ambulatory Visit: Payer: Medicare Other | Admitting: Physical Therapy

## 2014-02-24 ENCOUNTER — Encounter: Payer: Self-pay | Admitting: *Deleted

## 2014-06-29 ENCOUNTER — Other Ambulatory Visit: Payer: Self-pay | Admitting: Family Medicine

## 2014-06-29 DIAGNOSIS — M542 Cervicalgia: Secondary | ICD-10-CM

## 2014-07-04 ENCOUNTER — Ambulatory Visit
Admission: RE | Admit: 2014-07-04 | Discharge: 2014-07-04 | Disposition: A | Discharge status: Home / Self Care | Payer: Medicare Other | Source: Ambulatory Visit | Attending: Family Medicine | Admitting: Family Medicine

## 2014-07-04 DIAGNOSIS — M542 Cervicalgia: Secondary | ICD-10-CM

## 2014-08-01 ENCOUNTER — Ambulatory Visit: Payer: Medicare Other | Attending: Neurosurgery

## 2014-08-01 DIAGNOSIS — M542 Cervicalgia: Secondary | ICD-10-CM | POA: Diagnosis not present

## 2014-08-15 ENCOUNTER — Ambulatory Visit: Payer: Medicare Other | Attending: Neurosurgery

## 2014-08-15 DIAGNOSIS — M542 Cervicalgia: Secondary | ICD-10-CM | POA: Insufficient documentation

## 2014-08-17 ENCOUNTER — Ambulatory Visit: Payer: Medicare Other | Admitting: Physical Therapy

## 2014-08-17 DIAGNOSIS — M542 Cervicalgia: Secondary | ICD-10-CM | POA: Diagnosis not present

## 2014-08-22 ENCOUNTER — Ambulatory Visit: Payer: Medicare Other | Admitting: Physical Therapy

## 2014-08-22 DIAGNOSIS — M542 Cervicalgia: Secondary | ICD-10-CM | POA: Diagnosis not present

## 2014-08-24 ENCOUNTER — Ambulatory Visit: Payer: Medicare Other | Admitting: Physical Therapy

## 2014-08-24 DIAGNOSIS — M542 Cervicalgia: Secondary | ICD-10-CM | POA: Diagnosis not present

## 2014-08-29 ENCOUNTER — Ambulatory Visit: Payer: Medicare Other | Admitting: Physical Therapy

## 2014-08-29 DIAGNOSIS — M542 Cervicalgia: Secondary | ICD-10-CM | POA: Diagnosis not present

## 2014-08-31 ENCOUNTER — Encounter: Payer: Medicare Other | Admitting: Physical Therapy

## 2014-09-05 ENCOUNTER — Ambulatory Visit: Payer: Medicare Other

## 2014-09-05 DIAGNOSIS — M542 Cervicalgia: Secondary | ICD-10-CM | POA: Diagnosis not present

## 2014-09-07 ENCOUNTER — Ambulatory Visit: Payer: Medicare Other

## 2014-09-07 DIAGNOSIS — M542 Cervicalgia: Secondary | ICD-10-CM | POA: Diagnosis not present

## 2014-09-11 HISTORY — PX: EYE SURGERY: SHX253

## 2014-10-16 ENCOUNTER — Telehealth: Payer: Self-pay | Admitting: Oncology

## 2014-10-16 NOTE — Telephone Encounter (Signed)
Chart delivered on 10/16/14.  TG

## 2014-11-03 ENCOUNTER — Telehealth: Payer: Self-pay | Admitting: Oncology

## 2014-11-03 NOTE — Telephone Encounter (Signed)
Called Bobbi at referring office to request SPEP lab result.  left message.

## 2014-11-10 ENCOUNTER — Other Ambulatory Visit (HOSPITAL_BASED_OUTPATIENT_CLINIC_OR_DEPARTMENT_OTHER): Payer: Medicare Other

## 2014-11-10 ENCOUNTER — Telehealth: Payer: Self-pay | Admitting: Oncology

## 2014-11-10 ENCOUNTER — Ambulatory Visit (HOSPITAL_BASED_OUTPATIENT_CLINIC_OR_DEPARTMENT_OTHER): Payer: Medicare Other | Admitting: Oncology

## 2014-11-10 ENCOUNTER — Ambulatory Visit: Payer: Medicare Other

## 2014-11-10 ENCOUNTER — Ambulatory Visit (HOSPITAL_COMMUNITY)
Admission: RE | Admit: 2014-11-10 | Discharge: 2014-11-10 | Disposition: A | Discharge status: Home / Self Care | Payer: Medicare Other | Source: Ambulatory Visit | Attending: Oncology | Admitting: Oncology

## 2014-11-10 VITALS — BP 139/60 | HR 75 | Temp 97.7°F | Resp 19 | Ht 67.0 in | Wt 151.3 lb

## 2014-11-10 DIAGNOSIS — D472 Monoclonal gammopathy: Secondary | ICD-10-CM | POA: Diagnosis not present

## 2014-11-10 DIAGNOSIS — D729 Disorder of white blood cells, unspecified: Secondary | ICD-10-CM

## 2014-11-10 DIAGNOSIS — M5022 Other cervical disc displacement, mid-cervical region: Secondary | ICD-10-CM | POA: Diagnosis not present

## 2014-11-10 DIAGNOSIS — M899 Disorder of bone, unspecified: Secondary | ICD-10-CM | POA: Diagnosis not present

## 2014-11-10 DIAGNOSIS — D691 Qualitative platelet defects: Secondary | ICD-10-CM | POA: Diagnosis present

## 2014-11-10 NOTE — Progress Notes (Signed)
Spoke with patient, gave results of bone survey, per dr Alen Blew.

## 2014-11-10 NOTE — Progress Notes (Signed)
Please see consult note.  

## 2014-11-10 NOTE — Progress Notes (Signed)
Left patient message on am to call me re x-rays.

## 2014-11-10 NOTE — Telephone Encounter (Signed)
Gave avs & calendar for April. °

## 2014-11-10 NOTE — Consult Note (Signed)
Reason for Referral: Plasma cell disorder.   HPI: Nicole Keith is a pleasant 74 year old woman currently of [place] where she lived most of her life. She does have history of osteoarthritis and mild osteopenia. She had a fracture of her femoral neck and had a hip replacement back in June 2014. She also has a diagnosis of a herniated disc on C6 and C7 and currently receiving physical therapy. She has reported some neck pain and what she describes as nerve pain radiating into her right arm. She was noted on a routine laboratory testing by Dr. Orland Mustard to have elevated total protein on 09/19/2014 at 9.5. On January 2015 her total protein was mildly elevated at 8.7. Her chemistries was otherwise normal with a creatinine of 0.73, calcium of 9.4 and a normal albumin of 3.8. She had normal liver function tests as well. She had a normal CBC with white cell count of 9.7, hemoglobin 13.4 and a platelet count of 316. She had a serum protein electrophoresis done on 09/27/2014 and showed an M spike of 3.2 g/dL. Quantitative immunoglobulins showed a elevated IgG level of 4685. IgA and IgM were within normal range. Her free lambda light chain was elevated at 186 and At the lambda ratio was diminished at 0.07. Based on these findings patient referred to me for evaluation of her plasma cell disorder.  Clinically, she has had bone pain as described above predominantly in her neck and her hip. Her hip pain has improved since her operation. She has not reported any osteoporosis but did have osteopenia. She did not have any pathological fractures. She did not report any peripheral neuropathy or recurrent infections. She does not report any headaches, blurry vision, double vision, syncope or seizures. She does not report any fevers, chills, sweats, weight loss or appetite changes. She continues to be very active and has excellent performance status. She does not report any chest pain, palpitation, orthopnea or leg edema. She does  not report any cough, wheezing, hemoptysis or shortness of breath. She does not report any nausea, vomiting, abdominal pain, satiety. She does not report any constipation, diarrhea. She does not report any frequency, urgency, hesitancy. She does not report any lymphadenopathy or petechiae. Remaining review of systems unremarkable.   Past Medical History  Diagnosis Date  . Colitis, collagenous   . Arthritis   . GERD (gastroesophageal reflux disease)     exacerbation of reflux-like symptoms 04/2011  . Diverticular disease     severe sig tics/fixation 2012  . Fracture of femoral neck, right 01/09/2013  . Osteoarthritis of right hip 01/10/2013  . Allergic rhinitis   . Hiatal hernia   . Hyperlipidemia   . Family hx of colon cancer   :  Past Surgical History  Procedure Laterality Date  . Leg surgery Left   . Tonsillectomy    . Cholecystectomy    . Total hip arthroplasty Right 01/10/2013    Procedure: TOTAL HIP ARTHROPLASTY;  Surgeon: Johnny Bridge, MD;  Location: [place];  Service: Orthopedics;  Laterality: Right;  :   Current outpatient prescriptions:  .  Cholecalciferol (VITAMIN D-3) 1000 UNITS CAPS, Take 1 capsule by mouth daily., Disp: , Rfl:  .  famotidine-calcium carbonate-magnesium hydroxide (PEPCID COMPLETE) 10-800-165 MG CHEW chewable tablet, Chew 1 tablet by mouth daily as needed., Disp: , Rfl:  .  aspirin (ASPIR-81) 81 MG EC tablet, Take 81 mg by mouth daily. Swallow whole., Disp: , Rfl: :  Allergies  Allergen Reactions  . Actonel [Risedronate Sodium]  GI distress  . Boniva [Ibandronic Acid]     GI distress  . Flagyl [Metronidazole]     Diarrhea at normal dose  . Fosamax [Alendronate Sodium]     GI distress  . Valium [Diazepam] Other (See Comments)  . Betadine [Povidone Iodine] Rash  . Doxycycline Rash  . Penicillins Rash    Ancef was OK.  :  Family History  Problem Relation Age of Onset  . Congestive Heart Failure Mother   . Arthritis-Osteo Mother   . Colon  cancer Mother   . Heart attack Father   . Rheum arthritis Father   . Colon cancer Maternal Aunt   . Colon cancer Maternal Aunt   . Hypothyroidism Sister   :  History   Social History  . Marital Status: Married    Spouse Name: N/A  . Number of Children: N/A  . Years of Education: N/A   Occupational History  . Not on file.   Social History Main Topics  . Smoking status: Former Smoker    Quit date: 01/10/1972  . Smokeless tobacco: Not on file     Comment: quit 1973  . Alcohol Use: No  . Drug Use: No  . Sexual Activity: Not on file   Other Topics Concern  . Not on file   Social History Narrative  . No narrative on file  :  Pertinent items are noted in HPI.  Exam: ECOG 0 Blood pressure 139/60, pulse 75, temperature 97.7 F (36.5 C), temperature source Oral, resp. rate 19, height '5\' 7"'  (1.702 m), weight 151 lb 4.8 oz (68.629 kg), SpO2 99 %. General appearance: alert and cooperative Head: Normocephalic, without obvious abnormality Throat: lips, mucosa, and tongue normal; teeth and gums normal Neck: no adenopathy Back: negative Resp: clear to auscultation bilaterally Chest wall: no tenderness Cardio: regular rate and rhythm, S1, S2 normal, no murmur, click, rub or gallop GI: soft, non-tender; bowel sounds normal; no masses,  no organomegaly Extremities: extremities normal, atraumatic, no cyanosis or edema Pulses: 2+ and symmetric Skin: Skin color, texture, turgor normal. No rashes or lesions Lymph nodes: Cervical, supraclavicular, and axillary nodes normal.  CBC    Component Value Date/Time   WBC 9.1 01/12/2013 0454   RBC 2.78* 01/12/2013 0454   HGB 8.8* 01/12/2013 0454   HCT 26.2* 01/12/2013 0454   PLT 227 01/12/2013 0454   MCV 94.2 01/12/2013 0454   MCH 31.7 01/12/2013 0454   MCHC 33.6 01/12/2013 0454   RDW 13.8 01/12/2013 0454      Chemistry      Component Value Date/Time   NA 137 01/12/2013 0454   K 4.1 01/12/2013 0454   CL 107 01/12/2013 0454    CO2 27 01/12/2013 0454   BUN 6 01/12/2013 0454   CREATININE 0.61 01/12/2013 0454      Component Value Date/Time   CALCIUM 8.0* 01/12/2013 0454   ALKPHOS 60 01/10/2013 0629   AST 20 01/10/2013 0629   ALT 13 01/10/2013 0629   BILITOT 0.7 01/10/2013 0629     IMPRESSION: 1. Soft disc protrusion into the right neural foramen with right foraminal stenosis at C6-7 which could affect the right C7 nerve. Moderate left C6-7 foraminal stenosis. 2. 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 Adjacent moderate right facet arthritis at C4-5.   Assessment and Plan:   74 year old woman with the following issues:  1. Monoclonal gammopathy presenting with IgG lambda monoclonal protein with an M spike of 3.2 g/dL. Her quantitative immunoglobulin showed  an IgG level of 4685 and a free lambda light chain of 186. She does not have other abnormalities including a normal hemoglobin, normal calcium and clinically normal immune function.  The differential diagnosis was discussed today including condition such as monoclonal gammopathy of undetermined significance (MGUS), smoldering myeloma, multiple myeloma, amyloidosis and reactive findings. To work this up completely, she will need a skeletal survey to rule out any lytic bony lesions and a bone marrow biopsy to quantify her plasma cell percentage in the bone marrow.  She does not appear to be symptomatic or has an end organ damage at this time. Clearly, she has skeletal involvement then she meets the criteria of multiple myeloma. She has no end organ damage it is very possible that we are dealing with smoldering multiple myeloma which put her at a high risk of evolving into active myeloma.   Before we make this determinati will have to wait for the bone marrow biopsy results as well as the skeletal survey.  The risks and benefits of bone marrow biopsy were discussed. Complications include pain, thrombosis, bleeding, infection were discussed and she is  ready to proceed.  2. C6 and C7 herniation and status post hip replacement: I do not think these are related to her plasma cell disorder but certainly chronic inflammatory conditions can cause elevation in her serum protein electrophoresis but I doubt there is a correlation.  3. Neck pain: Likely related to herniated disc rather than plasma cell disorder or involvement of the bone.  4. Bone directed therapy: Depending on her skeletal survey and multiple myeloma status she might benefit from Zometa down the line.  All questions were answered today to her satisfaction. Her follow-up will be determined after her bone marrow biopsy has been completed.

## 2014-11-15 ENCOUNTER — Other Ambulatory Visit: Payer: Self-pay | Admitting: Radiology

## 2014-11-16 ENCOUNTER — Encounter (HOSPITAL_COMMUNITY): Payer: Self-pay

## 2014-11-16 ENCOUNTER — Ambulatory Visit (HOSPITAL_COMMUNITY)
Admission: RE | Admit: 2014-11-16 | Discharge: 2014-11-16 | Disposition: A | Discharge status: Home / Self Care | Payer: Medicare Other | Source: Ambulatory Visit | Attending: Oncology | Admitting: Oncology

## 2014-11-16 ENCOUNTER — Ambulatory Visit (HOSPITAL_COMMUNITY)
Admission: RE | Admit: 2014-11-16 | Discharge: 2014-11-16 | Disposition: A | Discharge status: Home / Self Care | Payer: Medicare Other | Source: Ambulatory Visit | Attending: Interventional Radiology | Admitting: Interventional Radiology

## 2014-11-16 DIAGNOSIS — E785 Hyperlipidemia, unspecified: Secondary | ICD-10-CM | POA: Diagnosis not present

## 2014-11-16 DIAGNOSIS — Z9049 Acquired absence of other specified parts of digestive tract: Secondary | ICD-10-CM | POA: Insufficient documentation

## 2014-11-16 DIAGNOSIS — Z79899 Other long term (current) drug therapy: Secondary | ICD-10-CM | POA: Diagnosis not present

## 2014-11-16 DIAGNOSIS — Z87891 Personal history of nicotine dependence: Secondary | ICD-10-CM | POA: Diagnosis not present

## 2014-11-16 DIAGNOSIS — K5790 Diverticulosis of intestine, part unspecified, without perforation or abscess without bleeding: Secondary | ICD-10-CM | POA: Insufficient documentation

## 2014-11-16 DIAGNOSIS — K449 Diaphragmatic hernia without obstruction or gangrene: Secondary | ICD-10-CM | POA: Insufficient documentation

## 2014-11-16 DIAGNOSIS — Z7982 Long term (current) use of aspirin: Secondary | ICD-10-CM | POA: Diagnosis not present

## 2014-11-16 DIAGNOSIS — D472 Monoclonal gammopathy: Secondary | ICD-10-CM | POA: Insufficient documentation

## 2014-11-16 DIAGNOSIS — Z96641 Presence of right artificial hip joint: Secondary | ICD-10-CM | POA: Diagnosis not present

## 2014-11-16 DIAGNOSIS — D729 Disorder of white blood cells, unspecified: Secondary | ICD-10-CM | POA: Insufficient documentation

## 2014-11-16 DIAGNOSIS — K219 Gastro-esophageal reflux disease without esophagitis: Secondary | ICD-10-CM | POA: Insufficient documentation

## 2014-11-16 LAB — CBC WITH DIFFERENTIAL/PLATELET
Basophils Absolute: 0.1 10*3/uL (ref 0.0–0.1)
Basophils Relative: 1 % (ref 0–1)
Eosinophils Absolute: 0.1 10*3/uL (ref 0.0–0.7)
Eosinophils Relative: 1 % (ref 0–5)
HCT: 40.9 % (ref 36.0–46.0)
Hemoglobin: 13.4 g/dL (ref 12.0–15.0)
Lymphocytes Relative: 27 % (ref 12–46)
Lymphs Abs: 2.3 10*3/uL (ref 0.7–4.0)
MCH: 31.5 pg (ref 26.0–34.0)
MCHC: 32.8 g/dL (ref 30.0–36.0)
MCV: 96.2 fL (ref 78.0–100.0)
Monocytes Absolute: 0.6 10*3/uL (ref 0.1–1.0)
Monocytes Relative: 7 % (ref 3–12)
Neutro Abs: 5.6 10*3/uL (ref 1.7–7.7)
Neutrophils Relative %: 64 % (ref 43–77)
Platelets: 340 10*3/uL (ref 150–400)
RBC: 4.25 MIL/uL (ref 3.87–5.11)
RDW: 13.8 % (ref 11.5–15.5)
WBC: 8.7 10*3/uL (ref 4.0–10.5)

## 2014-11-16 LAB — BONE MARROW EXAM

## 2014-11-16 LAB — PROTIME-INR
INR: 0.97 (ref 0.00–1.49)
Prothrombin Time: 13 seconds (ref 11.6–15.2)

## 2014-11-16 LAB — APTT: aPTT: 26 seconds (ref 24–37)

## 2014-11-16 MED ORDER — MIDAZOLAM HCL 2 MG/2ML IJ SOLN
INTRAMUSCULAR | Status: AC | PRN
  Administered 2014-11-16 (×2): 1 mg via INTRAVENOUS
  Administered 2014-11-16: 0.5 mg via INTRAVENOUS

## 2014-11-16 MED ORDER — SODIUM CHLORIDE 0.9 % IV SOLN
INTRAVENOUS | Status: DC
  Administered 2014-11-16: 08:00:00 via INTRAVENOUS

## 2014-11-16 MED ORDER — FENTANYL CITRATE 0.05 MG/ML IJ SOLN
INTRAMUSCULAR | Status: AC | PRN
  Administered 2014-11-16: 50 ug via INTRAVENOUS
  Administered 2014-11-16: 25 ug via INTRAVENOUS

## 2014-11-16 MED ORDER — DIPHENHYDRAMINE HCL 50 MG/ML IJ SOLN
INTRAMUSCULAR | Status: AC
  Filled 2014-11-16: qty 1

## 2014-11-16 MED ORDER — FENTANYL CITRATE 0.05 MG/ML IJ SOLN
INTRAMUSCULAR | Status: AC
  Filled 2014-11-16: qty 4

## 2014-11-16 MED ORDER — MIDAZOLAM HCL 2 MG/2ML IJ SOLN
INTRAMUSCULAR | Status: AC
  Filled 2014-11-16: qty 6

## 2014-11-16 NOTE — Procedures (Signed)
Interventional Radiology Procedure Note  Procedure: CT guided aspirate and core biopsy of right iliac bone Complications: None [id number]  1 hour recovery.  Venetia Night. Kathlene Cote, M.D. Pager:  [phone]

## 2014-11-16 NOTE — H&P (Signed)
Chief Complaint: "I'm here for a bone marrow biopsy"  Referring Physician(s): Shadad,Firas N  History of Present Illness: Nicole Keith is a 74 y.o. female with history of monoclonal gammopathy and M spike on recent serum protein electrophoresis who presents today for CT guided bone marrow biopsy for further evaluation.  Past Medical History  Diagnosis Date  . Colitis, collagenous   . Arthritis   . GERD (gastroesophageal reflux disease)     exacerbation of reflux-like symptoms 04/2011  . Diverticular disease     severe sig tics/fixation 2012  . Fracture of femoral neck, right 01/09/2013  . Osteoarthritis of right hip 01/10/2013  . Allergic rhinitis   . Hiatal hernia   . Hyperlipidemia   . Family hx of colon cancer     Past Surgical History  Procedure Laterality Date  . Leg surgery Left   . Tonsillectomy    . Cholecystectomy    . Total hip arthroplasty Right 01/10/2013    Procedure: TOTAL HIP ARTHROPLASTY;  Surgeon: Johnny Bridge, MD;  Location: [place];  Service: Orthopedics;  Laterality: Right;  . Eye surgery  feb 2016    cataract right eye    Allergies: Actonel; Boniva; Flagyl; Fosamax; Valium; Betadine; Doxycycline; and Penicillins  Medications: Prior to Admission medications   Medication Sig Start Date End Date Taking? Authorizing Provider  famotidine-calcium carbonate-magnesium hydroxide (PEPCID COMPLETE) 10-800-165 MG CHEW chewable tablet Chew 1 tablet by mouth daily as needed (GERD).    Yes Historical Provider, MD  Polyethyl Glycol-Propyl Glycol (SYSTANE OP) Apply 1-2 drops to eye 2 (two) times daily.   Yes Historical Provider, MD  aspirin (ASPIR-81) 81 MG EC tablet Take 81 mg by mouth every morning. Swallow whole.    Historical Provider, MD  Cholecalciferol (VITAMIN D-3) 1000 UNITS CAPS Take 1 capsule by mouth every morning.     Historical Provider, MD  cyclobenzaprine (FLEXERIL) 10 MG tablet Take 10 mg by mouth at bedtime as needed for muscle spasms.     Historical Provider, MD  ibuprofen (ADVIL,MOTRIN) 200 MG tablet Take 400 mg by mouth at bedtime.    Historical Provider, MD    Family History  Problem Relation Age of Onset  . Congestive Heart Failure Mother   . Arthritis-Osteo Mother   . Colon cancer Mother   . Heart attack Father   . Rheum arthritis Father   . Colon cancer Maternal Aunt   . Colon cancer Maternal Aunt   . Hypothyroidism Sister     History   Social History  . Marital Status: Married    Spouse Name: N/A  . Number of Children: N/A  . Years of Education: N/A   Social History Main Topics  . Smoking status: Former Smoker    Quit date: 01/10/1972  . Smokeless tobacco: Not on file     Comment: quit 1973  . Alcohol Use: No  . Drug Use: No  . Sexual Activity: Not on file   Other Topics Concern  . Not on file   Social History Narrative      Review of Systems  Constitutional: Negative for fever and chills.  Respiratory: Negative for cough and shortness of breath.   Cardiovascular: Negative for chest pain.  Gastrointestinal: Negative for nausea, vomiting and abdominal pain.  Genitourinary: Negative for dysuria and hematuria.  Musculoskeletal: Positive for arthralgias and neck pain.  Neurological: Negative for headaches.  Hematological: Does not bruise/bleed easily.  Psychiatric/Behavioral: The patient is nervous/anxious.     Vital  Signs: BP 149/84 mmHg  Pulse 87  Temp(Src) 98 F (36.7 C) (Oral)  Resp 18  Ht _0  (1.702 m)  Wt 151 lb (68.493 kg)  BMI 23.64 kg/m2  SpO2 98%  Physical Exam  Constitutional: She is oriented to person, place, and time. She appears well-developed and well-nourished.  Cardiovascular: Normal rate and regular rhythm.   Pulmonary/Chest: Effort normal and breath sounds normal.  Abdominal: Soft. Bowel sounds are normal. There is no tenderness.  Musculoskeletal: Normal range of motion. She exhibits no edema.  Neurological: She is alert and oriented to person, place, and  time.    Imaging: Dg Bone Survey Met  11/10/2014   CLINICAL DATA:  Plasma cell disorder.  EXAM: METASTATIC BONE SURVEY  COMPARISON:  None.  FINDINGS: No focal lytic lesions are noted to suggest myeloma or metastatic disease. Senescent changes are present including prominent cervical thoracic and lumbar degenerative change. Prior total right hip replacement. Postsurgical changes proximal left tibia. Sclerotic density noted of proximal right tibia most likely represents a healed benign bone lesion. If the patient is symptomatic in this region MRI of the tib-fib can be obtained.  IMPRESSION: 1. No lytic lesions noted to suggest myeloma. 2. Well-circumscribed density noted in the proximal right tibia, most likely a healed benign bone lesion. If the patient is symptomatic in this region, MRI of the right tib-fib can be obtained to further evaluate.   Electronically Signed   By: Marcello Moores  Register   On: 11/10/2014 12:44    Labs:  CBC:  Recent Labs  11/16/14 0730  WBC 8.7  HGB 13.4  HCT 40.9  PLT 340    COAGS:  Recent Labs  11/16/14 0730  INR 0.97  APTT 26    BMP: No results for input(s): NA, K, CL, CO2, GLUCOSE, BUN, CALCIUM, CREATININE, GFRNONAA, GFRAA in the last 8760 hours.  Invalid input(s): CMP  LIVER FUNCTION TESTS: No results for input(s): BILITOT, AST, ALT, ALKPHOS, PROT, ALBUMIN in the last 8760 hours.  TUMOR MARKERS: No results for input(s): AFPTM, CEA, CA199, CHROMGRNA in the last 8760 hours.  Assessment and Plan: Nicole Keith is a 74 y.o. female with history of monoclonal gammopathy and M spike on recent serum protein electrophoresis who presents today for CT guided bone marrow biopsy for further evaluation.Risks and benefits discussed with the patient including, but not limited to bleeding, infection, damage to adjacent structures or low yield requiring additional tests. All of the patient's questions were answered, patient is agreeable to proceed. Consent signed  and in chart.       Signed: Autumn Messing 11/16/2014, 8:46 AM   I spent a total of 20 minutes face to face in clinical consultation, greater than 50% of which was counseling/coordinating care for CT guided bone marrow biopsy

## 2014-11-16 NOTE — Discharge Instructions (Signed)
Bone Marrow Aspiration, Bone Marrow Biopsy Care After Read the instructions outlined below and refer to this sheet in the next few weeks. These discharge instructions provide you with general information on caring for yourself after you leave the hospital. Your caregiver may also give you specific instructions. While your treatment has been planned according to the most current medical practices available, unavoidable complications occasionally occur. If you have any problems or questions after discharge, call your caregiver. FINDING OUT THE RESULTS OF YOUR TEST Not all test results are available during your visit. If your test results are not back during the visit, make an appointment with your caregiver to find out the results. Do not assume everything is normal if you have not heard from your caregiver or the medical facility. It is important for you to follow up on all of your test results.  HOME CARE INSTRUCTIONS  You have had sedation and may be sleepy or dizzy. Your thinking may not be as clear as usual. For the next 24 hours:  Only take over-the-counter or prescription medicines for pain, discomfort, and or fever as directed by your caregiver.  Do not drink alcohol.  Do not smoke.  Do not drive.  Do not make important legal decisions.  Do not operate heavy machinery.  Do not care for small children by yourself.  Keep your dressing clean and dry. You may replace dressing with a bandage after 24 hours.  You may take a bath or shower after 24 hours.  Use an ice pack for 20 minutes every 2 hours while awake for pain as needed. SEEK MEDICAL CARE IF:   There is redness, swelling, or increasing pain at the biopsy site.  There is pus coming from the biopsy site.  There is drainage from a biopsy site lasting longer than one day.  An unexplained oral temperature above 102 F (38.9 C) develops. SEEK IMMEDIATE MEDICAL CARE IF:   You develop a rash.  You have difficulty  breathing.  You develop any reaction or side effects to medications given. Document Released: 02/14/2005 Document Revised: 10/20/2011 Document Reviewed: 07/25/2008 [facility] Patient Information 2015 [place], [place]. This information is not intended to replace advice given to you by your health care provider. Make sure you discuss any questions you have with your health care provider.  May remove dressing and shower 24 hours post procedure. Keep site clean and dry. Report any signs of infection.  Conscious Sedation Sedation is the use of medicines to promote relaxation and relieve discomfort and anxiety. Conscious sedation is a type of sedation. Under conscious sedation you are less alert than normal but are still able to respond to instructions or stimulation. Conscious sedation is used during short medical and dental procedures. It is milder than deep sedation or general anesthesia and allows you to return to your regular activities sooner.  LET [facility] CARE PROVIDER KNOW ABOUT:   Any allergies you have.  All medicines you are taking, including vitamins, herbs, eye drops, creams, and over-the-counter medicines.  Use of steroids (by mouth or creams).  Previous problems you or members of your family have had with the use of anesthetics.  Any blood disorders you have.  Previous surgeries you have had.  Medical conditions you have.  Possibility of pregnancy, if this applies.  Use of cigarettes, alcohol, or illegal drugs. RISKS AND COMPLICATIONS Generally, this is a safe procedure. However, as with any procedure, problems can occur. Possible problems include:  Oversedation.  Trouble breathing on your  own. You may need to have a breathing tube until you are awake and breathing on your own.  Allergic reaction to any of the medicines used for the procedure. BEFORE THE PROCEDURE  You may have blood tests done. These tests can help show how well your kidneys and liver are working.  They can also show how well your blood clots.  A physical exam will be done.  Only take medicines as directed by your health care provider. You may need to stop taking medicines (such as blood thinners, aspirin, or nonsteroidal anti-inflammatory drugs) before the procedure.   Do not eat or drink at least 6 hours before the procedure or as directed by your health care provider.  Arrange for a responsible adult, family member, or friend to take you home after the procedure. He or she should stay with you for at least 24 hours after the procedure, until the medicine has worn off. PROCEDURE   An intravenous (IV) catheter will be inserted into one of your veins. Medicine will be able to flow directly into your body through this catheter. You may be given medicine through this tube to help prevent pain and help you relax.  The medical or dental procedure will be done. AFTER THE PROCEDURE  You will stay in a recovery area until the medicine has worn off. Your blood pressure and pulse will be checked.   Depending on the procedure you had, you may be allowed to go home when you can tolerate liquids and your pain is under control. Document Released: 04/22/2001 Document Revised: 08/02/2013 Document Reviewed: 04/04/2013 [facility] Patient Information 2015 [place], [place]. This information is not intended to replace advice given to you by your health care provider. Make sure you discuss any questions you have with your health care provider. Conscious Sedation, Adult, Care After Refer to this sheet in the next few weeks. These instructions provide you with information on caring for yourself after your procedure. Your health care provider may also give you more specific instructions. Your treatment has been planned according to current medical practices, but problems sometimes occur. Call your health care provider if you have any problems or questions after your procedure. WHAT TO EXPECT AFTER THE  PROCEDURE  After your procedure:  You may feel sleepy, clumsy, and have poor balance for several hours.  Vomiting may occur if you eat too soon after the procedure. HOME CARE INSTRUCTIONS  Do not participate in any activities where you could become injured for at least 24 hours. Do not:  Drive.  Swim.  Ride a bicycle.  Operate heavy machinery.  Cook.  Use power tools.  Climb ladders.  Work from a high place.  Do not make important decisions or sign legal documents until you are improved.  If you vomit, drink water, juice, or soup when you can drink without vomiting. Make sure you have little or no nausea before eating solid foods.  Only take over-the-counter or prescription medicines for pain, discomfort, or fever as directed by your health care provider.  Make sure you and your family fully understand everything about the medicines given to you, including what side effects may occur.  You should not drink alcohol, take sleeping pills, or take medicines that cause drowsiness for at least 24 hours.  If you smoke, do not smoke without supervision.  If you are feeling better, you may resume normal activities 24 hours after you were sedated.  Keep all appointments with your health care provider. [place]  IF:  Your skin is pale or bluish in color.  You continue to feel nauseous or vomit.  Your pain is getting worse and is not helped by medicine.  You have bleeding or swelling.  You are still sleepy or feeling clumsy after 24 hours. SEEK IMMEDIATE MEDICAL CARE IF:  You develop a rash.  You have difficulty breathing.  You develop any type of allergic problem.  You have a fever. MAKE SURE YOU:  Understand these instructions.  Will watch your condition.  Will get help right away if you are not doing well or get worse. Document Released: 05/18/2013 Document Reviewed: 05/18/2013 [facility] Patient Information 2015 [place], [place]. This information is  not intended to replace advice given to you by your health care provider. Make sure you discuss any questions you have with your health care provider.

## 2014-11-27 LAB — CHROMOSOME ANALYSIS, BONE MARROW

## 2014-11-27 LAB — TISSUE HYBRIDIZATION (BONE MARROW)-NCBH

## 2014-11-28 ENCOUNTER — Telehealth: Payer: Self-pay | Admitting: Oncology

## 2014-11-28 ENCOUNTER — Other Ambulatory Visit: Payer: Medicare Other

## 2014-11-28 ENCOUNTER — Ambulatory Visit (HOSPITAL_BASED_OUTPATIENT_CLINIC_OR_DEPARTMENT_OTHER): Payer: Medicare Other | Admitting: Oncology

## 2014-11-28 VITALS — BP 134/70 | HR 82 | Temp 98.1°F | Resp 20 | Ht 67.0 in | Wt 152.1 lb

## 2014-11-28 DIAGNOSIS — D472 Monoclonal gammopathy: Secondary | ICD-10-CM | POA: Diagnosis not present

## 2014-11-28 DIAGNOSIS — D729 Disorder of white blood cells, unspecified: Secondary | ICD-10-CM

## 2014-11-28 NOTE — Telephone Encounter (Signed)
Error

## 2014-11-28 NOTE — Telephone Encounter (Signed)
Gave avs & calendar for July °

## 2014-11-28 NOTE — Progress Notes (Signed)
Hematology and Oncology Follow Up Visit  Nicole Keith [id number] 14-Apr-1941 74 y.o. 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 She has no active and organ damage or symptomatology. She presented with an M spike of 3.2 g/dL and IgG level of 4685 diagnosed in April 2016. She has 29% plasma cell infiltration in the bone marrow.    Current therapy: Close observation and consideration to start treatment if she develops symptomatic disease.  Interim History:  Nicole Keith presents today for a follow-up visit. Since her last visit, she underwent a skeletal survey that did not show any bone lesions. She also had a bone marrow biopsy that showed increased plasma cell infiltration close to 29%. She continues to be completely asymptomatic at this time. She did not have any bone pain from the the bone marrow biopsy. She still have a bit of anxiety associated with these findings and her recent diagnosis. She still have chronic neck pain and back pain which is have not really changed.   She did not report any peripheral neuropathy or recurrent infections. She does not report any headaches, blurry vision, double vision, syncope or seizures. She does not report any fevers, chills, sweats, weight loss or appetite changes. She continues to be very active and has excellent performance status. She does not report any chest pain, palpitation, orthopnea or leg edema. She does not report any cough, wheezing, hemoptysis or shortness of breath. She does not report any nausea, vomiting, abdominal pain, satiety. She does not report any constipation, diarrhea. She does not report any frequency, urgency, hesitancy. She does not report any lymphadenopathy or petechiae. Remaining review of systems unremarkable.   Medications: I have reviewed the patient's current medications.  Current Outpatient  Prescriptions  Medication Sig Dispense Refill  . aspirin (ASPIR-81) 81 MG EC tablet Take 81 mg by mouth every morning. Swallow whole.    . Cholecalciferol (VITAMIN D-3) 1000 UNITS CAPS Take 1 capsule by mouth every morning.     . cyclobenzaprine (FLEXERIL) 10 MG tablet Take 10 mg by mouth at bedtime as needed for muscle spasms.    . famotidine-calcium carbonate-magnesium hydroxide (PEPCID COMPLETE) 10-800-165 MG CHEW chewable tablet Chew 1 tablet by mouth daily as needed (GERD).     [name] Kitchen ibuprofen (ADVIL,MOTRIN) 200 MG tablet Take 400 mg by mouth at bedtime.    Nicole Keith Glycol-Propyl Glycol (SYSTANE OP) Apply 1-2 drops to eye 2 (two) times daily.     No current facility-administered medications for this visit.     Allergies:  Allergies  Allergen Reactions  . Actonel [Risedronate Sodium]     GI distress  . Boniva [Ibandronic Acid]     GI distress  . Flagyl [Metronidazole]     Diarrhea at normal dose  . Fosamax [Alendronate Sodium]     GI distress  . Valium [Diazepam] Other (See Comments)  . Betadine [Povidone Iodine] Rash  . Doxycycline Rash  . Penicillins Rash    Ancef was OK.    Past Medical History, Surgical history, Social history, and Family History were reviewed and updated.   Physical Exam: Blood pressure 134/70, pulse 82, temperature 98.1 F (36.7 C), temperature source Oral, resp. rate 20, height _0  (1.702 m), weight 152 lb 1.6 oz (68.992 kg), SpO2 97 %. ECOG: 0 General appearance: alert and cooperative Head: Normocephalic, without obvious abnormality Neck: no adenopathy Lymph nodes: Cervical, supraclavicular, and axillary nodes  normal. Heart:regular rate and rhythm, S1, S2 normal, no murmur, click, rub or gallop Lung:chest clear, no wheezing, rales, normal symmetric air entry Abdomin: soft, non-tender, without masses or organomegaly EXT:no erythema, induration, or nodules   Lab Results: Lab Results  Component Value Date   WBC 8.7 11/16/2014   HGB 13.4  11/16/2014   HCT 40.9 11/16/2014   MCV 96.2 11/16/2014   PLT 340 11/16/2014     Chemistry      Component Value Date/Time   NA 137 01/12/2013 0454   K 4.1 01/12/2013 0454   CL 107 01/12/2013 0454   CO2 27 01/12/2013 0454   BUN 6 01/12/2013 0454   CREATININE 0.61 01/12/2013 0454      Component Value Date/Time   CALCIUM 8.0* 01/12/2013 0454   ALKPHOS 60 01/10/2013 0629   AST 20 01/10/2013 0629   ALT 13 01/10/2013 0629   BILITOT 0.7 01/10/2013 0629       Radiological Studies:  EXAM: METASTATIC BONE SURVEY  COMPARISON: None.  FINDINGS: No focal lytic lesions are noted to suggest myeloma or metastatic disease. Senescent changes are present including prominent cervical thoracic and lumbar degenerative change. Prior total right hip replacement. Postsurgical changes proximal left tibia. Sclerotic density noted of proximal right tibia most likely represents a healed benign bone lesion. If the patient is symptomatic in this region MRI of the tib-fib can be obtained.  IMPRESSION: 1. No lytic lesions noted to suggest myeloma. 2. Well-circumscribed density noted in the proximal right tibia, most likely a healed benign bone lesion. If the patient is symptomatic in this region, MRI of the right tib-fib can be obtained to further evaluate.  Impression and Plan:  74 year old woman with the following issues:  1. Monoclonal gammopathy presenting with IgG lambda monoclonal protein with an M spike of 3.2 g/dL. Her quantitative immunoglobulin showed an IgG level of 4685 and a free lambda light chain of 186. She does not have other abnormalities including a normal hemoglobin, normal calcium and clinically normal immune function. Her skeletal survey did not show any lytic bone lesions and her bone marrow biopsy showed 29% plasma cell infiltration.  These findings suggest smoldering myeloma without any evidence of symptomatology or and organ damage. I explained to her that she is a  high risk of developing symptomatic multiple myeloma in the future. That risk could be close to 5-10% a year. I see no indication for starting treatment at this time to the fact that she is completely asymptomatic and does not have and organ damage as I mentioned.  I have recommended close observation and surveillance and repeat protein studies as well as chemistries in 3 months. If she develops signs of symptoms of active disease, we'll initiate therapy at that time.  2. Follow-up: Will be in 3 months to repeat laboratory testing. We'll repeat x-rays in 6-12 months or as needed.   Zola Button, MD 4/19/20163:57 PM

## 2015-02-27 ENCOUNTER — Other Ambulatory Visit (HOSPITAL_BASED_OUTPATIENT_CLINIC_OR_DEPARTMENT_OTHER): Payer: Medicare Other

## 2015-02-27 ENCOUNTER — Telehealth: Payer: Self-pay | Admitting: Oncology

## 2015-02-27 ENCOUNTER — Ambulatory Visit (HOSPITAL_BASED_OUTPATIENT_CLINIC_OR_DEPARTMENT_OTHER): Payer: Medicare Other | Admitting: Oncology

## 2015-02-27 VITALS — BP 147/65 | HR 70 | Temp 98.1°F | Resp 18 | Ht 67.0 in | Wt 149.8 lb

## 2015-02-27 DIAGNOSIS — D472 Monoclonal gammopathy: Secondary | ICD-10-CM

## 2015-02-27 DIAGNOSIS — D729 Disorder of white blood cells, unspecified: Secondary | ICD-10-CM

## 2015-02-27 LAB — CBC WITH DIFFERENTIAL/PLATELET
BASO%: 1 % (ref 0.0–2.0)
Basophils Absolute: 0.1 10*3/uL (ref 0.0–0.1)
EOS%: 1.2 % (ref 0.0–7.0)
Eosinophils Absolute: 0.1 10*3/uL (ref 0.0–0.5)
HCT: 39.4 % (ref 34.8–46.6)
HGB: 13.2 g/dL (ref 11.6–15.9)
LYMPH%: 36.5 % (ref 14.0–49.7)
MCH: 31.6 pg (ref 25.1–34.0)
MCHC: 33.6 g/dL (ref 31.5–36.0)
MCV: 94.1 fL (ref 79.5–101.0)
MONO#: 0.7 10*3/uL (ref 0.1–0.9)
MONO%: 8.3 % (ref 0.0–14.0)
NEUT#: 4.4 10*3/uL (ref 1.5–6.5)
NEUT%: 53 % (ref 38.4–76.8)
Platelets: 348 10*3/uL (ref 145–400)
RBC: 4.19 10*6/uL (ref 3.70–5.45)
RDW: 13.7 % (ref 11.2–14.5)
WBC: 8.4 10*3/uL (ref 3.9–10.3)
lymph#: 3.1 10*3/uL (ref 0.9–3.3)

## 2015-02-27 LAB — COMPREHENSIVE METABOLIC PANEL (CC13)
ALT: 10 U/L (ref 0–55)
AST: 19 U/L (ref 5–34)
Albumin: 3.4 g/dL — ABNORMAL LOW (ref 3.5–5.0)
Alkaline Phosphatase: 75 U/L (ref 40–150)
Anion Gap: 6 mEq/L (ref 3–11)
BUN: 11.6 mg/dL (ref 7.0–26.0)
CO2: 27 mEq/L (ref 22–29)
Calcium: 9.3 mg/dL (ref 8.4–10.4)
Chloride: 103 mEq/L (ref 98–109)
Creatinine: 0.8 mg/dL (ref 0.6–1.1)
EGFR: 76 mL/min/{1.73_m2} — ABNORMAL LOW (ref >90)
Glucose: 93 mg/dl (ref 70–140)
Potassium: 4.2 mEq/L (ref 3.5–5.1)
Sodium: 136 mEq/L (ref 136–145)
Total Bilirubin: 0.34 mg/dL (ref 0.20–1.20)
Total Protein: 9.5 g/dL — ABNORMAL HIGH (ref 6.4–8.3)

## 2015-02-27 NOTE — Progress Notes (Signed)
Hematology and Oncology Follow Up Visit  Nicole Keith [id number] [birth year] 74 y.o. 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 She has no active and organ damage or symptomatology. She presented with an M spike of 3.2 g/dL and IgG level of 4685 diagnosed in April 2016. She has 29% plasma cell infiltration in the bone marrow. Skeletal survey did not any bone lesions in 11/2014.    Current therapy: Close observation and consideration to start treatment if she develops symptomatic disease.  Interim History:  Nicole Keith presents today for a follow-up visit. Since her last visit, she reports no new complaints. She continues to be completely asymptomatic at this time. She still have chronic neck pain and back pain which is have not really changed. She has not reported any fractures or dislocations. She does report occasional headaches associated with her radiculopathy in the neck. She does have cataract that needs to be removed from her eye.  She did not report any peripheral neuropathy or recurrent infections. She does not report any  blurry vision, double vision, syncope or seizures. She does not report any fevers, chills, sweats, weight loss or appetite changes. She continues to be very active and has excellent performance status. She does not report any chest pain, palpitation, orthopnea or leg edema. She does not report any cough, wheezing, hemoptysis or shortness of breath. She does not report any nausea, vomiting, abdominal pain, satiety. She does not report any constipation, diarrhea. She does not report any frequency, urgency, hesitancy. She does not report any lymphadenopathy or petechiae. Remaining review of systems unremarkable.   Medications: I have reviewed the patient's current medications.  Current Outpatient Prescriptions  Medication Sig Dispense Refill  .  famotidine-calcium carbonate-magnesium hydroxide (PEPCID COMPLETE) 10-800-165 MG CHEW chewable tablet Chew 1 tablet by mouth daily as needed (GERD).     [name] Kitchen ibuprofen (ADVIL,MOTRIN) 200 MG tablet Take 400 mg by mouth at bedtime.     No current facility-administered medications for this visit.     Allergies:  Allergies  Allergen Reactions  . Betadine [Povidone Iodine] Rash  . Flagyl [Metronidazole] Diarrhea  . Fosamax [Alendronate Sodium] Other (See Comments)    GI distress  . Valium [Diazepam] Other (See Comments)  . Actonel [Risedronate Sodium] Other (See Comments)    GI distress  . Boniva [Ibandronic Acid] Other (See Comments)    GI distress  . Doxycycline Rash  . Penicillins Rash    Ancef was OK.    Past Medical History, Surgical history, Social history, and Family History were reviewed and updated.   Physical Exam: Blood pressure 147/65, pulse 70, temperature 98.1 F (36.7 C), temperature source Oral, resp. rate 18, height '5\' 7"'  (1.702 m), weight 149 lb 12.8 oz (67.949 kg), SpO2 98 %. ECOG: 0 General appearance: alert and cooperative. Not in any distress.  Head: Normocephalic, without obvious abnormality Neck: no adenopathy Lymph nodes: Cervical, supraclavicular, and axillary nodes normal. Heart:regular rate and rhythm, S1, S2 normal, no murmur, click, rub or gallop Lung:chest clear, no wheezing, rales, normal symmetric air entry Abdomin: soft, non-tender, without masses or organomegaly EXT:no erythema, induration, or nodules   Lab Results: Lab Results  Component Value Date   WBC 8.4 02/27/2015   HGB 13.2 02/27/2015   HCT 39.4 02/27/2015   MCV 94.1 02/27/2015   PLT 348 02/27/2015     Chemistry      Component Value Date/Time  NA 136 02/27/2015 1421   NA 137 01/12/2013 0454   K 4.2 02/27/2015 1421   K 4.1 01/12/2013 0454   CL 107 01/12/2013 0454   CO2 27 02/27/2015 1421   CO2 27 01/12/2013 0454   BUN 11.6 02/27/2015 1421   BUN 6 01/12/2013 0454    CREATININE 0.8 02/27/2015 1421   CREATININE 0.61 01/12/2013 0454      Component Value Date/Time   CALCIUM 9.3 02/27/2015 1421   CALCIUM 8.0* 01/12/2013 0454   ALKPHOS 75 02/27/2015 1421   ALKPHOS 60 01/10/2013 0629   AST 19 02/27/2015 1421   AST 20 01/10/2013 0629   ALT 10 02/27/2015 1421   ALT 13 01/10/2013 0629   BILITOT 0.34 02/27/2015 1421   BILITOT 0.7 01/10/2013 0629       Radiological Studies:  EXAM: METASTATIC BONE SURVEY  COMPARISON: None.  FINDINGS: No focal lytic lesions are noted to suggest myeloma or metastatic disease. Senescent changes are present including prominent cervical thoracic and lumbar degenerative change. Prior total right hip replacement. Postsurgical changes proximal left tibia. Sclerotic density noted of proximal right tibia most likely represents a healed benign bone lesion. If the patient is symptomatic in this region MRI of the tib-fib can be obtained.  IMPRESSION: 1. No lytic lesions noted to suggest myeloma. 2. Well-circumscribed density noted in the proximal right tibia, most likely a healed benign bone lesion. If the patient is symptomatic in this region, MRI of the right tib-fib can be obtained to further evaluate.  Impression and Plan:  74 year old woman with the following issues:  1. Monoclonal gammopathy presenting with IgG lambda monoclonal protein with an M spike of 3.2 g/dL. Her quantitative immunoglobulin showed an IgG level of 4685 and a free lambda light chain of 186. She does not have other abnormalities including a normal hemoglobin, normal calcium and clinically normal immune function. Her skeletal survey did not show any lytic bone lesions and her bone marrow biopsy showed 29% plasma cell infiltration.  These findings suggest smoldering myeloma without any evidence of symptomatology or and organ damage. The plan is to continue active surveillance and institute therapy if she develops symptomatology or signs of end  organ damage.  2. Follow-up: Will be in 4 months to repeat laboratory testing. We willl repeat x-rays in 6 months.   [facility], MD 7/19/20163:03 PM

## 2015-02-27 NOTE — Telephone Encounter (Signed)
Gave and printed appt sched and avs fo rpt; for NOV  °

## 2015-03-01 LAB — SPEP & IFE WITH QIG
Abnormal Protein Band1: 3.5 g/dL
Albumin ELP: 4.2 g/dL (ref 3.8–4.8)
Alpha-1-Globulin: 0.3 g/dL (ref 0.2–0.3)
Alpha-2-Globulin: 0.7 g/dL (ref 0.5–0.9)
Beta 2: 0.3 g/dL (ref 0.2–0.5)
Beta Globulin: 0.4 g/dL (ref 0.4–0.6)
Gamma Globulin: 3.7 g/dL — ABNORMAL HIGH (ref 0.8–1.7)
IgA: 113 mg/dL (ref 69–380)
IgG (Immunoglobin G), Serum: 3570 mg/dL — ABNORMAL HIGH (ref 690–1700)
IgM, Serum: 55 mg/dL (ref 52–322)
Total Protein, Serum Electrophoresis: 9.6 g/dL — ABNORMAL HIGH (ref 6.1–8.1)

## 2015-03-01 LAB — KAPPA/LAMBDA LIGHT CHAINS
Kappa free light chain: 1.03 mg/dL (ref 0.33–1.94)
Kappa:Lambda Ratio: 0.06 — ABNORMAL LOW (ref 0.26–1.65)
Lambda Free Lght Chn: 17.7 mg/dL — ABNORMAL HIGH (ref 0.57–2.63)

## 2015-06-19 ENCOUNTER — Other Ambulatory Visit (HOSPITAL_BASED_OUTPATIENT_CLINIC_OR_DEPARTMENT_OTHER): Payer: Medicare Other

## 2015-06-19 ENCOUNTER — Telehealth: Payer: Self-pay | Admitting: Oncology

## 2015-06-19 ENCOUNTER — Ambulatory Visit (HOSPITAL_BASED_OUTPATIENT_CLINIC_OR_DEPARTMENT_OTHER): Payer: Medicare Other | Admitting: Oncology

## 2015-06-19 VITALS — BP 148/71 | HR 72 | Temp 97.7°F | Resp 16 | Wt 152.2 lb

## 2015-06-19 DIAGNOSIS — D729 Disorder of white blood cells, unspecified: Secondary | ICD-10-CM

## 2015-06-19 DIAGNOSIS — D472 Monoclonal gammopathy: Secondary | ICD-10-CM

## 2015-06-19 LAB — COMPREHENSIVE METABOLIC PANEL (CC13)
ALT: 19 U/L (ref 0–55)
AST: 23 U/L (ref 5–34)
Albumin: 3.2 g/dL — ABNORMAL LOW (ref 3.5–5.0)
Alkaline Phosphatase: 75 U/L (ref 40–150)
Anion Gap: 7 mEq/L (ref 3–11)
BUN: 11.5 mg/dL (ref 7.0–26.0)
CO2: 26 mEq/L (ref 22–29)
Calcium: 9.3 mg/dL (ref 8.4–10.4)
Chloride: 105 mEq/L (ref 98–109)
Creatinine: 0.8 mg/dL (ref 0.6–1.1)
EGFR: 77 mL/min/{1.73_m2} — ABNORMAL LOW (ref >90)
Glucose: 107 mg/dl (ref 70–140)
Potassium: 4.3 mEq/L (ref 3.5–5.1)
Sodium: 137 mEq/L (ref 136–145)
Total Bilirubin: 0.43 mg/dL (ref 0.20–1.20)
Total Protein: 9.4 g/dL — ABNORMAL HIGH (ref 6.4–8.3)

## 2015-06-19 LAB — CBC WITH DIFFERENTIAL/PLATELET
BASO%: 1.3 % (ref 0.0–2.0)
Basophils Absolute: 0.1 10*3/uL (ref 0.0–0.1)
EOS%: 2.9 % (ref 0.0–7.0)
Eosinophils Absolute: 0.2 10*3/uL (ref 0.0–0.5)
HCT: 38.1 % (ref 34.8–46.6)
HGB: 12.8 g/dL (ref 11.6–15.9)
LYMPH%: 34.8 % (ref 14.0–49.7)
MCH: 31.6 pg (ref 25.1–34.0)
MCHC: 33.5 g/dL (ref 31.5–36.0)
MCV: 94.3 fL (ref 79.5–101.0)
MONO#: 0.6 10*3/uL (ref 0.1–0.9)
MONO%: 7.9 % (ref 0.0–14.0)
NEUT#: 4 10*3/uL (ref 1.5–6.5)
NEUT%: 53.1 % (ref 38.4–76.8)
Platelets: 351 10*3/uL (ref 145–400)
RBC: 4.04 10*6/uL (ref 3.70–5.45)
RDW: 13.9 % (ref 11.2–14.5)
WBC: 7.6 10*3/uL (ref 3.9–10.3)
lymph#: 2.6 10*3/uL (ref 0.9–3.3)

## 2015-06-19 NOTE — Telephone Encounter (Signed)
Gave and printed appt sched and avs for pt for March 2017 °

## 2015-06-19 NOTE — Progress Notes (Signed)
Hematology and Oncology Follow Up Visit  Nicole Keith [id number] 1941-01-30 74 y.o. 06/19/2015 10:31 AM [name], [name], [name], MD   Principle Diagnosis: 74 year old woman with IgG lambda plasma cell disorder likely smoldering multiple myeloma. She has no end organ damage or symptomatology. She presented with an M spike of 3.2 g/dL and IgG level of 4685 diagnosed in April 2016. She has 29% plasma cell infiltration in the bone marrow. Skeletal survey did not any bone lesions in 11/2014.    Current therapy: Close observation and consideration to start treatment if she develops symptomatic disease.  Interim History:  Nicole Keith presents today for a follow-up visit. Since her last visit, she reports no new complaints. She continues to have episodic neck pain and tremors associated with a herniated disc. These are not changed since the last evaluation. She still have chronic  back pain which is have not really changed. She has not reported any fractures or dislocations. She does report occasional headaches associated with her radiculopathy in the neck. She has not reported any pathological fractures. Has not reported any peripheral neuropathy. Has not reported any recent hospitalization or illnesses.   She does not report any  blurry vision, double vision, syncope or seizures. She does not report any fevers, chills, sweats, weight loss or appetite changes. She continues to be very active and has excellent performance status. She does not report any chest pain, palpitation, orthopnea or leg edema. She does not report any cough, wheezing, hemoptysis or shortness of breath. She does not report any nausea, vomiting, abdominal pain, satiety. She does not report any constipation, diarrhea. She does not report any frequency, urgency, hesitancy. She does not report any lymphadenopathy or petechiae. Remaining review of systems unremarkable.   Medications: I have reviewed the patient's current  medications.  Current Outpatient Prescriptions  Medication Sig Dispense Refill  . famotidine (PEPCID) 20 MG tablet Take 20 mg by mouth daily.    [name] Kitchen ibuprofen (ADVIL,MOTRIN) 200 MG tablet Take 200 mg by mouth.     No current facility-administered medications for this visit.     Allergies:  Allergies  Allergen Reactions  . Betadine [Povidone Iodine] Rash  . Flagyl [Metronidazole] Diarrhea  . Fosamax [Alendronate Sodium] Other (See Comments)    GI distress  . Valium [Diazepam] Other (See Comments)  . Actonel [Risedronate Sodium] Other (See Comments)    GI distress  . Boniva [Ibandronic Acid] Other (See Comments)    GI distress  . Doxycycline Rash  . Penicillins Rash    Ancef was OK.    Past Medical History, Surgical history, Social history, and Family History were reviewed and updated.   Physical Exam: Her vitals were personally reviewed with a blood pressure 148/71. Pulse 72 with respirations of 16. Temo of 97.7. Her weight is 152.4 which is increased from previous visit. ECOG: 0 General appearance: alert and cooperative. Well-appearing woman without distress. Head: Normocephalic, without obvious abnormality no oral ulcers or lesions. Neck: no adenopathy Lymph nodes: Cervical, supraclavicular, and axillary nodes normal. Heart:regular rate and rhythm, S1, S2 normal, no murmur, click, rub or gallop Lung:chest clear, no wheezing, rales, normal symmetric air entry Abdomin: soft, non-tender, without masses or organomegaly EXT:no erythema, induration, or nodules   Lab Results: Lab Results  Component Value Date   WBC 7.6 06/19/2015   HGB 12.8 06/19/2015   HCT 38.1 06/19/2015   MCV 94.3 06/19/2015   PLT 351 06/19/2015     Chemistry      Component Value  Date/Time   NA 137 06/19/2015 0949   NA 137 01/12/2013 0454   K 4.3 06/19/2015 0949   K 4.1 01/12/2013 0454   CL 107 01/12/2013 0454   CO2 26 06/19/2015 0949   CO2 27 01/12/2013 0454   BUN 11.5 06/19/2015 0949   BUN  6 01/12/2013 0454   CREATININE 0.8 06/19/2015 0949   CREATININE 0.61 01/12/2013 0454      Component Value Date/Time   CALCIUM 9.3 06/19/2015 0949   CALCIUM 8.0* 01/12/2013 0454   ALKPHOS 75 06/19/2015 0949   ALKPHOS 60 01/10/2013 0629   AST 23 06/19/2015 0949   AST 20 01/10/2013 0629   ALT 19 06/19/2015 0949   ALT 13 01/10/2013 0629   BILITOT 0.43 06/19/2015 0949   BILITOT 0.7 01/10/2013 0629     Results for Nicole, Keith (MRN [id number]) as of 06/19/2015 10:34  Ref. Range 02/27/2015 14:21  IgG (Immunoglobin G), Serum Latest Ref Range: [phone] mg/dL 3570 (H)  IgA Latest Ref Range: 69-380 mg/dL 113  IgM, Serum Latest Ref Range: 52-322 mg/dL 55  Total Protein, Serum Electrophoresis Latest Ref Range: 6.1-8.1 g/dL 9.6 (H)  Kappa free light chain Latest Ref Range: 0.33-1.94 mg/dL 1.03  Lambda Free Lght Chn Latest Ref Range: 0.57-2.63 mg/dL 17.70 (H)  Kappa:Lambda Ratio Latest Ref Range: 0.26-1.65  0.06 (L)     Impression and Plan:  74 year old woman with the following issues:  1. Monoclonal gammopathy presenting with IgG lambda monoclonal protein with an M spike of 3.2 g/dL. Her quantitative immunoglobulin showed an IgG level of 4685 and a free lambda light chain of 186. She does not have other abnormalities including a normal hemoglobin, normal calcium and clinically normal immune function. Her skeletal survey did not show any lytic bone lesions and her bone marrow biopsy showed 29% plasma cell infiltration.  Since the last evaluation, she has no clinical signs of symptoms to suggest progression of disease. The plan is to continue with observation and surveillance for presumed smoldering multiple myeloma. Indications for active symptomatic myeloma treatment was reviewed with the patient today. She develops rapid increase in her protein studies, bone lesions, renal dysfunction or constitutional symptoms that active treatment will commence. The plan is to repeat her protein studies  and skeletal survey in 4 months.  2. Follow-up: Will be in 4 months to discuss the results of her testing.   [email or web address],RPZPS, MD 11/8/201610:31 AM

## 2015-06-21 LAB — SPEP & IFE WITH QIG
Abnormal Protein Band1: 3.3 g/dL
Albumin ELP: 4 g/dL (ref 3.8–4.8)
Alpha-1-Globulin: 0.3 g/dL (ref 0.2–0.3)
Alpha-2-Globulin: 0.7 g/dL (ref 0.5–0.9)
Beta 2: 0.3 g/dL (ref 0.2–0.5)
Beta Globulin: 0.4 g/dL (ref 0.4–0.6)
Gamma Globulin: 3.6 g/dL — ABNORMAL HIGH (ref 0.8–1.7)
IgA: 105 mg/dL (ref 69–380)
IgG (Immunoglobin G), Serum: 4030 mg/dL — ABNORMAL HIGH (ref 690–1700)
IgM, Serum: 49 mg/dL — ABNORMAL LOW (ref 52–322)
Total Protein, Serum Electrophoresis: 9.3 g/dL — ABNORMAL HIGH (ref 6.1–8.1)

## 2015-06-21 LAB — KAPPA/LAMBDA LIGHT CHAINS
Kappa free light chain: 0.78 mg/dL (ref 0.33–1.94)
Kappa:Lambda Ratio: 0.04 — ABNORMAL LOW (ref 0.26–1.65)
Lambda Free Lght Chn: 18.7 mg/dL — ABNORMAL HIGH (ref 0.57–2.63)

## 2015-06-27 ENCOUNTER — Telehealth: Payer: Self-pay | Admitting: *Deleted

## 2015-06-27 NOTE — Telephone Encounter (Signed)
Patient called and wanted lab results from 06/19/15. Message to be given to MD.

## 2015-10-17 ENCOUNTER — Ambulatory Visit (HOSPITAL_COMMUNITY)
Admission: RE | Admit: 2015-10-17 | Discharge: 2015-10-17 | Disposition: A | Discharge status: Home / Self Care | Payer: Medicare Other | Source: Ambulatory Visit | Attending: Oncology | Admitting: Oncology

## 2015-10-17 ENCOUNTER — Other Ambulatory Visit (HOSPITAL_BASED_OUTPATIENT_CLINIC_OR_DEPARTMENT_OTHER): Payer: Medicare Other

## 2015-10-17 DIAGNOSIS — D472 Monoclonal gammopathy: Secondary | ICD-10-CM | POA: Diagnosis not present

## 2015-10-17 DIAGNOSIS — D729 Disorder of white blood cells, unspecified: Secondary | ICD-10-CM | POA: Insufficient documentation

## 2015-10-17 DIAGNOSIS — M899 Disorder of bone, unspecified: Secondary | ICD-10-CM | POA: Insufficient documentation

## 2015-10-17 LAB — CBC WITH DIFFERENTIAL/PLATELET
BASO%: 0.2 % (ref 0.0–2.0)
Basophils Absolute: 0 10*3/uL (ref 0.0–0.1)
EOS%: 1.2 % (ref 0.0–7.0)
Eosinophils Absolute: 0.1 10*3/uL (ref 0.0–0.5)
HCT: 38.7 % (ref 34.8–46.6)
HGB: 12.9 g/dL (ref 11.6–15.9)
LYMPH%: 25.3 % (ref 14.0–49.7)
MCH: 31.4 pg (ref 25.1–34.0)
MCHC: 33.4 g/dL (ref 31.5–36.0)
MCV: 94.2 fL (ref 79.5–101.0)
MONO#: 0.7 10*3/uL (ref 0.1–0.9)
MONO%: 8.1 % (ref 0.0–14.0)
NEUT#: 6 10*3/uL (ref 1.5–6.5)
NEUT%: 65.2 % (ref 38.4–76.8)
Platelets: 339 10*3/uL (ref 145–400)
RBC: 4.11 10*6/uL (ref 3.70–5.45)
RDW: 14.3 % (ref 11.2–14.5)
WBC: 9.2 10*3/uL (ref 3.9–10.3)
lymph#: 2.3 10*3/uL (ref 0.9–3.3)

## 2015-10-17 LAB — COMPREHENSIVE METABOLIC PANEL
ALT: 13 U/L (ref 0–55)
AST: 18 U/L (ref 5–34)
Albumin: 3.2 g/dL — ABNORMAL LOW (ref 3.5–5.0)
Alkaline Phosphatase: 68 U/L (ref 40–150)
Anion Gap: 7 mEq/L (ref 3–11)
BUN: 12 mg/dL (ref 7.0–26.0)
CO2: 26 mEq/L (ref 22–29)
Calcium: 8.8 mg/dL (ref 8.4–10.4)
Chloride: 104 mEq/L (ref 98–109)
Creatinine: 0.8 mg/dL (ref 0.6–1.1)
EGFR: 71 mL/min/{1.73_m2} — ABNORMAL LOW (ref >90)
Glucose: 106 mg/dl (ref 70–140)
Potassium: 4.2 mEq/L (ref 3.5–5.1)
Sodium: 136 mEq/L (ref 136–145)
Total Bilirubin: 0.39 mg/dL (ref 0.20–1.20)
Total Protein: 10.2 g/dL — ABNORMAL HIGH (ref 6.4–8.3)

## 2015-10-18 LAB — KAPPA/LAMBDA LIGHT CHAINS
Ig Kappa Free Light Chain: 11.83 mg/L (ref 3.30–19.40)
Ig Lambda Free Light Chain: 228.34 mg/L — ABNORMAL HIGH (ref 5.71–26.30)
Kappa/Lambda FluidC Ratio: 0.05 — ABNORMAL LOW (ref 0.26–1.65)

## 2015-10-18 LAB — MULTIPLE MYELOMA PANEL, SERUM
Albumin SerPl Elph-Mcnc: 3.5 g/dL (ref 2.9–4.4)
Albumin/Glob SerPl: 0.6 — ABNORMAL LOW (ref 0.7–1.7)
Alpha 1: 0.2 g/dL (ref 0.0–0.4)
Alpha2 Glob SerPl Elph-Mcnc: 0.8 g/dL (ref 0.4–1.0)
B-Globulin SerPl Elph-Mcnc: 1 g/dL (ref 0.7–1.3)
Gamma Glob SerPl Elph-Mcnc: 3.9 g/dL — ABNORMAL HIGH (ref 0.4–1.8)
Globulin, Total: 5.9 g/dL — ABNORMAL HIGH (ref 2.2–3.9)
IgA, Qn, Serum: 103 mg/dL (ref 64–422)
IgG, Qn, Serum: 4537 mg/dL — ABNORMAL HIGH (ref 700–1600)
IgM, Qn, Serum: 50 mg/dL (ref 26–217)
M Protein SerPl Elph-Mcnc: 3.7 g/dL — ABNORMAL HIGH
Total Protein: 9.4 g/dL — ABNORMAL HIGH (ref 6.0–8.5)

## 2015-10-24 ENCOUNTER — Ambulatory Visit (HOSPITAL_BASED_OUTPATIENT_CLINIC_OR_DEPARTMENT_OTHER): Payer: Medicare Other | Admitting: Oncology

## 2015-10-24 ENCOUNTER — Telehealth: Payer: Self-pay | Admitting: Oncology

## 2015-10-24 VITALS — BP 138/67 | HR 84 | Temp 98.7°F | Resp 18 | Wt 154.4 lb

## 2015-10-24 DIAGNOSIS — R21 Rash and other nonspecific skin eruption: Secondary | ICD-10-CM

## 2015-10-24 DIAGNOSIS — D472 Monoclonal gammopathy: Secondary | ICD-10-CM | POA: Diagnosis not present

## 2015-10-24 NOTE — Telephone Encounter (Signed)
per pof to sch pt appt-gave pt copy of avs °

## 2015-10-24 NOTE — Progress Notes (Signed)
Hematology and Oncology Follow Up Visit  Nicole Keith [id number] [birth year] 76 y.o. 10/24/2015 10:35 AM [name], [name], [name], MD   Principle Diagnosis: 76 year old woman with IgG lambda plasma cell disorder likely smoldering multiple myeloma. She has no end organ damage or symptomatology. She presented with an M spike of 3.2 g/dL and IgG level of 4685 diagnosed in April 2016. She has 29% plasma cell infiltration in the bone marrow. Skeletal survey did not any bone lesions in 11/2014.    Current therapy: Close observation and consideration to start treatment if she develops symptomatic disease.  Interim History:  Nicole Keith presents today for a follow-up visit. Since her last visit, she continues to do very well. She did report a rash that was evaluated by dermatology and was treated for contact dermatitis. She has not reported any constitutional symptoms. She denied any fevers or chills or weight loss. She has not reported any fractures or dislocations. She does report occasional headaches associated with her radiculopathy in the neck. She has not reported any pathological fractures. Has not reported any peripheral neuropathy.   She continues to have excellent quality of life and performance status. She is able to attend activities of daily living without any decline. She is planning to spend most of the summer camping.   She does not report any  blurry vision, double vision, syncope or seizures. She does not report any fevers, chills, sweats, weight loss or appetite changes. She continues to be very active and has excellent performance status. She does not report any chest pain, palpitation, orthopnea or leg edema. She does not report any cough, wheezing, hemoptysis or shortness of breath. She does not report any nausea, vomiting, abdominal pain, satiety. She does not report any constipation, diarrhea. She does not report any frequency, urgency, hesitancy. She does not report any  lymphadenopathy or petechiae. Remaining review of systems unremarkable.   Medications: I have reviewed the patient's current medications.  Current Outpatient Prescriptions  Medication Sig Dispense Refill  . famotidine (PEPCID) 20 MG tablet Take 20 mg by mouth daily.    [name] Kitchen ibuprofen (ADVIL,MOTRIN) 200 MG tablet Take 200 mg by mouth.     No current facility-administered medications for this visit.     Allergies:  Allergies  Allergen Reactions  . Betadine [Povidone Iodine] Rash  . Flagyl [Metronidazole] Diarrhea  . Fosamax [Alendronate Sodium] Other (See Comments)    GI distress  . Valium [Diazepam] Other (See Comments)  . Actonel [Risedronate Sodium] Other (See Comments)    GI distress  . Boniva [Ibandronic Acid] Other (See Comments)    GI distress  . Doxycycline Rash  . Penicillins Rash    Ancef was OK.    Past Medical History, Surgical history, Social history, and Family History were reviewed and updated.   Physical Exam: Her vitals were personally reviewed with a blood pressure 148/71. Pulse 72 with respirations of 16. Temo of 97.7. Her weight is 152.4 which is increased from previous visit. ECOG: 0 General appearance: Well-appearing woman without distress. Head: Normocephalic, without obvious abnormality no oral thrush noted. Neck: no adenopathy Lymph nodes: Cervical, supraclavicular, and axillary nodes normal. Heart:regular rate and rhythm, S1, S2 normal, no murmur, click, rub or gallop Lung:chest clear, no wheezing, rales, normal symmetric air entry Abdomin: soft, non-tender, without masses or organomegaly no rebound or guarding. EXT:no erythema, induration, or nodules Skin: Slight erythema noted under her right axilla. Raised lesions noted on her elbows.  Lab Results: Lab Results  Component Value  Date   WBC 9.2 10/17/2015   HGB 12.9 10/17/2015   HCT 38.7 10/17/2015   MCV 94.2 10/17/2015   PLT 339 10/17/2015     Chemistry      Component Value Date/Time   NA  136 10/17/2015 0901   NA 137 01/12/2013 0454   K 4.2 10/17/2015 0901   K 4.1 01/12/2013 0454   CL 107 01/12/2013 0454   CO2 26 10/17/2015 0901   CO2 27 01/12/2013 0454   BUN 12.0 10/17/2015 0901   BUN 6 01/12/2013 0454   CREATININE 0.8 10/17/2015 0901   CREATININE 0.61 01/12/2013 0454      Component Value Date/Time   CALCIUM 8.8 10/17/2015 0901   CALCIUM 8.0* 01/12/2013 0454   ALKPHOS 68 10/17/2015 0901   ALKPHOS 60 01/10/2013 0629   AST 18 10/17/2015 0901   AST 20 01/10/2013 0629   ALT 13 10/17/2015 0901   ALT 13 01/10/2013 0629   BILITOT 0.39 10/17/2015 0901   BILITOT 0.7 01/10/2013 0629     Results for Nicole Keith, Nicole Keith (MRN [id number]) as of 10/24/2015 09:43  Ref. Range 02/27/2015 14:21 06/19/2015 09:49 10/17/2015 09:01  IgG (Immunoglobin G), Serum Latest Ref Range: [phone] mg/dL 3570 (H) 4030 (H) 4,537 (H)    Results for Nicole Keith, Nicole Keith (MRN [id number]) as of 10/24/2015 09:43  Ref. Range 10/17/2015 09:01  M Protein SerPl Elph-Mcnc Latest Ref Range: Not Observed g/dL 3.7 (H)     EXAM: METASTATIC BONE SURVEY  COMPARISON: November 10, 2014.  FINDINGS: There is noted elliptical sclerotic density seen in the proximal right tibial shaft which is stable compared to prior exam. Status post surgical internal fixation of old proximal left tibial fracture. Degenerative changes seen involving both shoulder joints and the spine. Status post right hip arthroplasty. No definite lytic defects are noted throughout the visualized skeleton.  IMPRESSION: No significant change compared to prior exam. Stable sclerotic density seen in proximal right tibial shaft most consistent with benign bone lesion. As noted previously, if patient is symptomatic in this area, MRI would be recommended for further evaluation. No definite evidence of lytic destruction is noted.   Impression and Plan:  75 year old woman with the following issues:  1. Monoclonal gammopathy presenting with IgG  lambda monoclonal protein presented in April 2016 with an M spike of 3.2 g/dL. Her quantitative immunoglobulin showed an IgG level of 4685 and a free lambda light chain of 186. She does not have other abnormalities including a normal hemoglobin, normal calcium and clinically normal immune function. Her skeletal survey did not show any lytic bone lesions and her bone marrow biopsy showed 29% plasma cell infiltration.  Repeat protein studies and skeletal survey from October 26, 2015 was reviewed today. Her M spike is slightly up to 3.7 and her IgG level slightly elevated up to 4500. She continues to have no evidence of end organ damage including normal hemoglobin, kidney function, electrolytes and calcium. Her skeletal survey continue show no evidence of bone lesions. These findings support the diagnosis of smoldering myeloma although she had increase risk of active myeloma. I see no indication to treat her at this time given the fact that she is completely asymptomatic.  I have recommended continued active surveillance and repeat protein studies in 3-6 months. She prefers to do this in November as she is very active this summer and would like to defer that to that time. I counseled her about signs or symptoms of disease progression and certainly will be happy to see  her sooner than scheduled appointment if the symptoms recur.  2. Rash: Likely related to a plasma cell disorder. I recommended follow up with dermatology regarding this issue.  3. Follow-up: Will be in November 1761 and certainly sooner if she needed to.   [facility], MD 3/15/201710:35 AM

## 2016-06-11 ENCOUNTER — Other Ambulatory Visit (HOSPITAL_BASED_OUTPATIENT_CLINIC_OR_DEPARTMENT_OTHER): Payer: Medicare Other

## 2016-06-11 DIAGNOSIS — D472 Monoclonal gammopathy: Secondary | ICD-10-CM

## 2016-06-11 LAB — CBC WITH DIFFERENTIAL/PLATELET
BASO%: 1.1 % (ref 0.0–2.0)
Basophils Absolute: 0.1 10*3/uL (ref 0.0–0.1)
EOS%: 1.4 % (ref 0.0–7.0)
Eosinophils Absolute: 0.1 10*3/uL (ref 0.0–0.5)
HCT: 36.5 % (ref 34.8–46.6)
HGB: 12.1 g/dL (ref 11.6–15.9)
LYMPH%: 33.9 % (ref 14.0–49.7)
MCH: 31.3 pg (ref 25.1–34.0)
MCHC: 33.2 g/dL (ref 31.5–36.0)
MCV: 94.1 fL (ref 79.5–101.0)
MONO#: 0.9 10*3/uL (ref 0.1–0.9)
MONO%: 9.3 % (ref 0.0–14.0)
NEUT#: 5 10*3/uL (ref 1.5–6.5)
NEUT%: 54.3 % (ref 38.4–76.8)
Platelets: 332 10*3/uL (ref 145–400)
RBC: 3.87 10*6/uL (ref 3.70–5.45)
RDW: 14.6 % — ABNORMAL HIGH (ref 11.2–14.5)
WBC: 9.2 10*3/uL (ref 3.9–10.3)
lymph#: 3.1 10*3/uL (ref 0.9–3.3)

## 2016-06-11 LAB — COMPREHENSIVE METABOLIC PANEL
ALT: 19 U/L (ref 0–55)
AST: 23 U/L (ref 5–34)
Albumin: 3.2 g/dL — ABNORMAL LOW (ref 3.5–5.0)
Alkaline Phosphatase: 74 U/L (ref 40–150)
Anion Gap: 5 mEq/L (ref 3–11)
BUN: 10.6 mg/dL (ref 7.0–26.0)
CO2: 27 mEq/L (ref 22–29)
Calcium: 8.8 mg/dL (ref 8.4–10.4)
Chloride: 102 mEq/L (ref 98–109)
Creatinine: 0.8 mg/dL (ref 0.6–1.1)
EGFR: 76 mL/min/{1.73_m2} — ABNORMAL LOW (ref >90)
Glucose: 105 mg/dl (ref 70–140)
Potassium: 3.9 mEq/L (ref 3.5–5.1)
Sodium: 134 mEq/L — ABNORMAL LOW (ref 136–145)
Total Bilirubin: 0.52 mg/dL (ref 0.20–1.20)
Total Protein: 9.9 g/dL — ABNORMAL HIGH (ref 6.4–8.3)

## 2016-06-12 LAB — KAPPA/LAMBDA LIGHT CHAINS
Ig Kappa Free Light Chain: 7.9 mg/L (ref 3.3–19.4)
Ig Lambda Free Light Chain: 193.7 mg/L — ABNORMAL HIGH (ref 5.7–26.3)
Kappa/Lambda FluidC Ratio: 0.04 — ABNORMAL LOW (ref 0.26–1.65)

## 2016-06-13 LAB — MULTIPLE MYELOMA PANEL, SERUM
Albumin SerPl Elph-Mcnc: 3.9 g/dL (ref 2.9–4.4)
Albumin/Glob SerPl: 0.7 (ref 0.7–1.7)
Alpha 1: 0.2 g/dL (ref 0.0–0.4)
Alpha2 Glob SerPl Elph-Mcnc: 0.7 g/dL (ref 0.4–1.0)
B-Globulin SerPl Elph-Mcnc: 1 g/dL (ref 0.7–1.3)
Gamma Glob SerPl Elph-Mcnc: 4.2 g/dL — ABNORMAL HIGH (ref 0.4–1.8)
Globulin, Total: 6.2 g/dL — ABNORMAL HIGH (ref 2.2–3.9)
IgA, Qn, Serum: 79 mg/dL (ref 64–422)
IgG, Qn, Serum: 4903 mg/dL — ABNORMAL HIGH (ref 700–1600)
IgM, Qn, Serum: 48 mg/dL (ref 26–217)
M Protein SerPl Elph-Mcnc: 3.8 g/dL — ABNORMAL HIGH
Total Protein: 10.1 g/dL — ABNORMAL HIGH (ref 6.0–8.5)

## 2016-06-18 ENCOUNTER — Telehealth: Payer: Self-pay | Admitting: Oncology

## 2016-06-18 ENCOUNTER — Ambulatory Visit (HOSPITAL_BASED_OUTPATIENT_CLINIC_OR_DEPARTMENT_OTHER): Payer: Medicare Other | Admitting: Oncology

## 2016-06-18 VITALS — BP 147/68 | HR 87 | Temp 97.8°F | Resp 18 | Ht 67.0 in | Wt 155.6 lb

## 2016-06-18 DIAGNOSIS — D472 Monoclonal gammopathy: Secondary | ICD-10-CM

## 2016-06-18 NOTE — Telephone Encounter (Signed)
Appointments scheduled per 06/18/16 los. AVS report and appointment schedule was given to patient, per 06/18/16 los. °

## 2016-06-18 NOTE — Progress Notes (Signed)
Hematology and Oncology Follow Up Visit  Nicole Keith [id number] [birth year] 75 y.o. 06/18/2016 10:41 AM [name], [name], [name], MD   Principle Diagnosis: 75 year old woman with IgG lambda plasma cell disorder representing smoldering multiple myeloma. She has no end organ damage or symptomatology. She presented with an M spike of 3.2 g/dL and IgG level of 4685 diagnosed in April 2016. She has 29% plasma cell infiltration in the bone marrow. Skeletal survey did not any bone lesions in 11/2014.    Current therapy: Close observation and consideration to start treatment if she develops symptomatic disease.  Interim History:  Nicole Keith presents today for a follow-up visit. Since her last visit, she continues to be asymptomatic at this time. She did have an injury to her right ribs while she is cleaning the bathroom and imaging studies did not show any fractures. This injury is healed at this time and does not report any other bone pain.  She has not reported any constitutional symptoms. She denied any fevers or chills or weight loss. She has not reported any fractures or dislocations.  She continues to have excellent quality of life and performance status. She is able to attend activities of daily living without any decline. She continues to golf regularly.   She does not report any  blurry vision, double vision, syncope or seizures. She does not report any fevers, chills, sweats, weight loss or appetite changes. She does not report any chest pain, palpitation, orthopnea or leg edema. She does not report any cough, wheezing, hemoptysis or shortness of breath. She does not report any nausea, vomiting, abdominal pain, satiety. She does not report any constipation, diarrhea. She does not report any frequency, urgency, hesitancy. She does not report any lymphadenopathy or petechiae. Remaining review of systems unremarkable.   Medications: I have reviewed the patient's current medications.   Current Outpatient Prescriptions  Medication Sig Dispense Refill  . calcium acetate (PHOSLO) 667 MG capsule Take 600 mg by mouth daily.    . famotidine (PEPCID) 20 MG tablet Take 20 mg by mouth daily.    [name] Kitchen ibuprofen (ADVIL,MOTRIN) 200 MG tablet Take 200 mg by mouth.     No current facility-administered medications for this visit.      Allergies:  Allergies  Allergen Reactions  . Betadine [Povidone Iodine] Rash  . Flagyl [Metronidazole] Diarrhea  . Fosamax [Alendronate Sodium] Other (See Comments)    GI distress  . Valium [Diazepam] Other (See Comments)  . Actonel [Risedronate Sodium] Other (See Comments)    GI distress  . Boniva [Ibandronic Acid] Other (See Comments)    GI distress  . Doxycycline Rash  . Penicillins Rash    Ancef was OK.    Past Medical History, Surgical history, Social history, and Family History were reviewed and updated.   Physical Exam: Blood pressure (!) 147/68, pulse 87, temperature 97.8 F (36.6 C), temperature source Oral, resp. rate 18, height _0  (1.702 m), weight 155 lb 9.6 oz (70.6 kg), SpO2 98 %.   ECOG: 0 General appearance: Alert, awake woman without distress. Head: Normocephalic, without obvious abnormality no oral ulcers or lesions. Neck: no adenopathy Lymph nodes: Cervical, supraclavicular, and axillary nodes normal. Heart:regular rate and rhythm, S1, S2 normal, no murmur, click, rub or gallop Lung:chest clear, no wheezing, rales, normal symmetric air entry Abdomin: soft, non-tender, without masses or organomegaly no rebound or guarding. EXT:no erythema, induration, or nodules Skin: No erythema or induration. No rashes or ecchymosis.  Lab Results: Lab Results  Component Value Date   WBC 9.2 06/11/2016   HGB 12.1 06/11/2016   HCT 36.5 06/11/2016   MCV 94.1 06/11/2016   PLT 332 06/11/2016     Chemistry      Component Value Date/Time   NA 134 (L) 06/11/2016 1006   K 3.9 06/11/2016 1006   CL 107 01/12/2013 0454   CO2 27  06/11/2016 1006   BUN 10.6 06/11/2016 1006   CREATININE 0.8 06/11/2016 1006      Component Value Date/Time   CALCIUM 8.8 06/11/2016 1006   ALKPHOS 74 06/11/2016 1006   AST 23 06/11/2016 1006   ALT 19 06/11/2016 1006   BILITOT 0.52 06/11/2016 1006      Results for TEMICA, RIGHETTI (MRN [id number]) as of 06/18/2016 10:21  Ref. Range 10/17/2015 09:01 06/11/2016 10:06  IgG (Immunoglobin G), Serum Latest Ref Range: 700 - 1600 mg/dL 4,537 (H) 4,903 (H)   Results for YAFFA, SECKMAN (MRN [id number]) as of 06/18/2016 10:21  Ref. Range 10/17/2015 09:01 06/11/2016 10:06  M Protein SerPl Elph-Mcnc Latest Ref Range: Not Observed g/dL 3.7 (H) 3.8 (H)    Impression and Plan:  75 year old woman with the following issues:  1. Monoclonal gammopathy presenting with IgG lambda monoclonal protein presented in April 2016 with an M spike of 3.2 g/dL. Her quantitative immunoglobulin showed an IgG level of 4685 and a free lambda light chain of 186. Her initial staging workup did not reveal any end organ damage and 29% plasma cell infiltration. This appears to be smoldering multiple myeloma and currently on observation and surveillance.  Repeat protein studies obtained on 06/11/2016 were reviewed today and showed no major changes at this time. Her M spike is around 3.8 g/dL and IgG level continues to be about the same.  The natural course of this disease was discussed with the patient again today. She has high risk of developing symptomatic multiple myeloma although this has not happened at this time. Active treatment will be warranted if she develops any evidence of end organ damage, symptomatology or bone lesions.  The plan is to continue with close monitoring and repeat protein studies and skeletal survey in April 2018.   2. Rib injury: Appears to be healing at this time.  3. Follow-up: Will be in April 2018.  AKLTYV,DPBAQ, MD 11/8/201710:41 AM

## 2016-09-15 ENCOUNTER — Ambulatory Visit (INDEPENDENT_AMBULATORY_CARE_PROVIDER_SITE_OTHER): Payer: Medicare Other | Admitting: Internal Medicine

## 2016-09-15 VITALS — BP 122/80 | HR 81 | Ht 66.5 in | Wt 158.0 lb

## 2016-09-15 DIAGNOSIS — M810 Age-related osteoporosis without current pathological fracture: Secondary | ICD-10-CM | POA: Insufficient documentation

## 2016-09-15 DIAGNOSIS — M8000XD Age-related osteoporosis with current pathological fracture, unspecified site, subsequent encounter for fracture with routine healing: Secondary | ICD-10-CM | POA: Diagnosis not present

## 2016-09-15 DIAGNOSIS — E559 Vitamin D deficiency, unspecified: Secondary | ICD-10-CM

## 2016-09-15 LAB — BASIC METABOLIC PANEL WITH GFR
BUN: 12 mg/dL (ref 7–25)
CO2: 31 mmol/L (ref 20–31)
Calcium: 9.1 mg/dL (ref 8.6–10.4)
Chloride: 100 mmol/L (ref 98–110)
Creat: 0.75 mg/dL (ref 0.60–0.93)
GFR, Est African American: >89 mL/min (ref >=60)
GFR, Est Non African American: 78 mL/min (ref >=60)
Glucose, Bld: 102 mg/dL — ABNORMAL HIGH (ref 65–99)
Potassium: 5.1 mmol/L (ref 3.5–5.3)
Sodium: 134 mmol/L — ABNORMAL LOW (ref 135–146)

## 2016-09-15 LAB — VITAMIN D 25 HYDROXY (VIT D DEFICIENCY, FRACTURES): VITD: 19.79 ng/mL — ABNORMAL LOW (ref 30.00–100.00)

## 2016-09-15 NOTE — Progress Notes (Signed)
Patient ID: Nicole Keith, female   DOB: October 28, 1940, 76 y.o.   MRN: [id number]    HPI  Nicole Keith is a 76 y.o.-year-old female, referred by her PCP, Dr. Orland Mustard, for management of osteoporosis.  Pt was dx with OP in 2017.  I reviewed pt's DEXA scans: Date L1-L4 T score FN T score R radius VFA  07/21/2016 ([facility]) -2.5 (-2.5%)  LFN: -2.4 (-5.2%*) UD: -3.0 33% D: -2.1 Neg. For fx.  10/18/2012 (same) -2.3  LFN: -2.0 RFN: -1.5 UD: -3.9 33% D: -1.9   10/02/2008 (same)  -2.0       No dizziness/vertigo/orthostasis/poor vision. No recent falls.  Fractures: - L tibial plateau 2004 - fell off deck - R hip >> s/p THR 01/2013 - fell off curb in parking lot - L wrist 2014 - fell in driveway  She lost 1 in from 5.7'.  Previous OP treatments: - Bisphosphonates (Fosamax, Boniva, Actonel)  in the 1990s >> GI intolerance - Evista - long time ago  No h/o vitamin D deficiency. No available vit D levels.  Pt is on [place] calcium 600 mg, vitamin D 1200 a day. She also eats dairy and green, leafy, vegetables.   No weight bearing exercises. She walks 4 times a week 1-2 miles, weather permitting. She also does bed exercises.  She does not take high vitamin A doses.  Menopause was at 77 y/o.   Pt does have a FH of osteoporosis (sister with wrist fracture, also mother).  No h/o hyper/hypocalcemia or hyperparathyroidism. No h/o kidney stones. Lab Results  Component Value Date   CALCIUM 8.8 06/11/2016   CALCIUM 8.8 10/17/2015   CALCIUM 9.3 06/19/2015   CALCIUM 9.3 02/27/2015   CALCIUM 8.0 (L) 01/12/2013   CALCIUM 7.7 (L) 01/11/2013   CALCIUM 8.3 (L) 01/10/2013   CALCIUM 9.0 01/09/2013   No h/o thyrotoxicosis. Reviewed TSH recent levels:  09/24/2015: TSH 1.7 No results found for: TSH  No h/o CKD. Last BUN/Cr: Lab Results  Component Value Date   BUN 10.6 06/11/2016   CREATININE 0.8 06/11/2016  11/27/2015: Aphos 68 (39-117).   Of note, pt has a h/o Es reflux,  hiatal hernia, Schatzki ring. She also has a history of diverticulosis and microscopic colitis. She also has a h/o MGUS. She has yearly bone scans. She has HL, anxiety, basal skin CA>  ROS: Constitutional: no weight gain/loss, no fatigue, no subjective hyperthermia/hypothermia Eyes: no blurry vision, no xerophthalmia ENT: no sore throat, no nodules palpated in throat, no dysphagia/odynophagia, no hoarseness Cardiovascular: no CP/SOB/palpitations/leg swelling Respiratory: no cough/SOB Gastrointestinal: no N/V/D/C Musculoskeletal: no muscle/+ joint aches Skin: no rashes Neurological: no tremors/numbness/tingling/dizziness Psychiatric: no depression/+ anxiety  Past Medical History:  Diagnosis Date  . Allergic rhinitis   . Arthritis   . Colitis, collagenous   . Diverticular disease    severe sig tics/fixation 2012  . Family hx of colon cancer   . Fracture of femoral neck, right 01/09/2013  . GERD (gastroesophageal reflux disease)    exacerbation of reflux-like symptoms 04/2011  . Hiatal hernia   . Hyperlipidemia   . Osteoarthritis of right hip 01/10/2013   Past Surgical History:  Procedure Laterality Date  . CHOLECYSTECTOMY    . EYE SURGERY  feb 2016   cataract right eye  . LEG SURGERY Left   . TONSILLECTOMY    . TOTAL HIP ARTHROPLASTY Right 01/10/2013   Procedure: TOTAL HIP ARTHROPLASTY;  Surgeon: Johnny Bridge, MD;  Location: [facility];  Service: Orthopedics;  Laterality: Right;   Social History   Social History  . Marital status: Married    Spouse name: N/A  . Number of children: 0   Occupational History  . retired   Social History Main Topics  . Smoking status: Former Smoker    Quit date: 01/10/1972  . Smokeless tobacco: Not on file     Comment: quit 1973  . Alcohol use No  . Drug use: No   Current Outpatient Prescriptions on File Prior to Visit  Medication Sig Dispense Refill  . famotidine (PEPCID) 20 MG tablet Take 20 mg by mouth daily.    [name] Kitchen ibuprofen  (ADVIL,MOTRIN) 200 MG tablet Take 200 mg by mouth.     No current facility-administered medications on file prior to visit.    Allergies  Allergen Reactions  . Betadine [Povidone Iodine] Rash  . Flagyl [Metronidazole] Diarrhea  . Fosamax [Alendronate Sodium] Other (See Comments)    GI distress  . Valium [Diazepam] Other (See Comments)  . Actonel [Risedronate Sodium] Other (See Comments)    GI distress  . Boniva [Ibandronic Acid] Other (See Comments)    GI distress  . Doxycycline Rash  . Penicillins Rash    Ancef was OK.   Family History  Problem Relation Age of Onset  . Congestive Heart Failure Mother   . Arthritis-Osteo Mother   . Colon cancer Mother   . Heart attack Father   . Rheum arthritis Father   . Colon cancer Maternal Aunt   . Colon cancer Maternal Aunt   . Hypothyroidism Sister    PE: BP 122/80 (BP Location: Left Arm, Patient Position: Sitting)   Pulse 81   Ht 5' 6.5" (1.689 m)   Wt 158 lb (71.7 kg)   SpO2 94%   BMI 25.12 kg/m  Wt Readings from Last 3 Encounters:  09/15/16 158 lb (71.7 kg)  06/18/16 155 lb 9.6 oz (70.6 kg)  10/24/15 154 lb 6.4 oz (70 kg)   Constitutional: normal weight, in NAD. + kyphosis. Eyes: PERRLA, EOMI, no exophthalmos ENT: moist mucous membranes, no thyromegaly, no cervical lymphadenopathy Cardiovascular: RRR, No MRG Respiratory: CTA B Gastrointestinal: abdomen soft, NT, ND, BS+ Musculoskeletal: no deformities, strength intact in all 4, no pain at percussion of spine Skin: moist, warm, no rashes Neurological: no tremor with outstretched hands, DTR normal in all 4  Assessment: 1. Osteoporosis  Plan: 1. Osteoporosis - likely postmenopausal, she has FH of OP - Discussed about increased risk of fracture, depending on the T score, greatly increased when the T score is lower than -2.5, but it is actually a continuum and -2.5 should not be regarded as an absolute threshold. We reviewed her DEXA scan report together, and I explained  that based on the T scores, she has an increased risk for fractures. I pinpointed that her T score at R hip was actually well in the osteopenia range when she had her hp fx. We also reviewed her radius scores and I explained that her UD radius score is a surrogate T score for the spine (trabecular bone) as the spine BMDs can be affected by OA with subsequent Calcium deposits >> her BMD of 3.0 at the UD radius reflects a much worse BMD situation that inferred from the spine T scores. - we reviewed her dietary and supplemental calcium and vitamin D intake. I recommended to make sure she gets 1000-1200 mg of calcium daily (ideally most from diet) and I will check vit D today  to see if she needs supplementation - given her specific instructions about food sources for Calcium and Vitamin D - see pt instructions  - discussed fall precautions   - given handout from [facility]: weight bearing exercises - advised to do this every day or at least 5/7 days - we discussed about maintaining a good amount of protein in her diet. The recommended daily protein intake is ~0.8 g per kilogram per day (~60 g per day for her). I advised her to try to aim for this amount, since a diet low in proteins can exacerbate osteoporosis. She eats out [facility]) every day for lunch >> salmon alternating with chicken. She does not smoke or drink >2 drinks of alcohol a day. - We discussed about the different medication classes, benefits and side effects (including atypical fractures and ONJ)  - I explained that, since she has GERD, hiatal hernia, microscopic colitis, I would not use oral bisphosphonates, so my first choice would be sq denosumab (Prolia) for 3-6 years, then zoledronic acid (iv Reclast) for 1-2 years. I would use Teriparatide as a last resort. Pt was given reading information about Prolia, and I explained the mechanism of action and expected benefits. She is reticent to start any meds, but I tried to  explain that the SEs that she heard about on TV are in fact very rare, and the risk-benefit ratio is very favorable. - today we will check: Orders Placed This Encounter  Procedures  . BASIC METABOLIC PANEL WITH GFR  . VITAMIN D 25 Hydroxy (Vit-D Deficiency, Fractures)  - will check a new DEXA scan in 2 years after starting Prolia or in 1 year if she decides against it-  I explained that the first indication that the treatment is working is her not having anymore fractures. DEXA scan changes are secondary: unchanged or slightly higher T-scores are desirable - will see pt back in a year  - time spent with the patient: 1 hour, of which >50% was spent in obtaining information about her symptoms, reviewing her previous labs, evaluations, and treatments, counseling her about her condition (please see the discussed topics above), and developing a plan to further investigate and treat it; she had a number of questions which I addressed.  Component     Latest Ref Rng & Units 09/15/2016          Sodium     135 - 146 mmol/L 134 (L)  Potassium     3.5 - 5.3 mmol/L 5.1  Chloride     98 - 110 mmol/L 100  CO2     20 - 31 mmol/L 31  Glucose     65 - 99 mg/dL 102 (H)  BUN     7 - 25 mg/dL 12  Creatinine     0.60 - 0.93 mg/dL 0.75  Calcium     8.6 - 10.4 mg/dL 9.1  GFR, Est African American     >=60 mL/min >89  GFR, Est Non African American     >=60 mL/min 78  VITD     30.00 - 100.00 ng/mL 19.79 (L)   Slightly low sodium and slightly high glucose (nonfasting labs). Her vitamin D is very low. I will suggest to increase her vitamin D supplement to 5000 units daily. We'll need another repeat a vitamin D level in 2-3 months.  Patient called [place] after the visit that she would like to forego Prolia for now.  Philemon Kingdom, MD PhD [facility] Endocrinology

## 2016-09-15 NOTE — Patient Instructions (Signed)
Please stop at the lab.  Decrease calcium to 600 mg daily.  Please think about Prolia.  Please return in 1 year.  How Can I Prevent Falls? Men and women with osteoporosis need to take care not to fall down. Falls can break bones. Some reasons people fall are: Poor vision  Poor balance  Certain diseases that affect how you walk  Some types of medicine, such as sleeping pills.  Some tips to help prevent falls outdoors are: Use a cane or walker  Wear rubber-soled shoes so you don't slip  Walk on grass when sidewalks are slippery  In winter, put salt or kitty litter on icy sidewalks.  Some ways to help prevent falls indoors are: Keep rooms free of clutter, especially on floors  Use plastic or carpet runners on slippery floors  Wear low-heeled shoes that provide good support  Do not walk in socks, stockings, or slippers  Be sure carpets and area rugs have skid-proof backs or are tacked to the floor  Be sure stairs are well lit and have rails on both sides  Put grab bars on bathroom walls near tub, shower, and toilet  Use a rubber bath mat in the shower or tub  Keep a flashlight next to your bed  Use a sturdy step stool with a handrail and wide steps  Add more lights in rooms (and night lights) Buy a cordless phone to keep with you so that you don't have to rush to the phone       when it rings and so that you can call for help if you fall.   (adapted from http://www.niams.NightlifePreviews.se)  Dietary sources of calcium and vitamin D:  Calcium content (mg) - http://www.niams.MoviePins.co.za  Fortified oatmeal, 1 packet 350  Sardines, canned in oil, with edible bones, 3 oz. 324  Cheddar cheese, 1 oz. shredded 306  Milk, nonfat, 1 cup 302  Milkshake, 1 cup 300  Yogurt, plain, low-fat, 1 cup 300  Soybeans, cooked, 1 cup 261  Tofu, firm, with calcium,  cup 204  Orange juice, fortified with calcium, 6 oz. 200-260  (varies)  Salmon, canned, with edible bones, 3 oz. 181  Pudding, instant, made with 2% milk,  cup 153  Baked beans, 1 cup Ronald, 1% milk fat, 1 cup 138  Spaghetti, lasagna, 1 cup 125  Frozen yogurt, vanilla, soft-serve,  cup 103  Ready-to-eat cereal, fortified with calcium, 1 cup 100-1,000 (varies)  Cheese pizza, 1 slice 009  Fortified waffles, 2 100  Turnip greens, boiled,  cup 99  Broccoli, raw, 1 cup 90  Ice cream, vanilla,  cup 85  Soy or rice milk, fortified with calcium, 1 cup 80-500 (varies)   Vitamin D content (International Units, IU) - https://www.ars.usda.gov Cod liver oil, 1 tablespoon 1,360  Swordfish, cooked, 3 oz 566  Salmon (sockeye), cooked, 3 oz 447  Tuna fish, canned in water, drained, 3 oz 154  Orange juice fortified with vitamin D, 1 cup (check product labels, as amount of added vitamin D varies) 137  Milk, nonfat, reduced fat, and whole, vitamin D-fortified, 1 cup 115-124  Yogurt, fortified with 20% of the daily value for vitamin D, 6 oz 80  Margarine, fortified, 1 tablespoon 60  Sardines, canned in oil, drained, 2 sardines 46  Liver, beef, cooked, 3 oz 42  Egg, 1 large (vitamin D is found in yolk) 41  Ready-to-eat cereal, fortified with 10% of the daily value for vitamin D, 0.75-1 cup  40  Cheese, Swiss, 1 oz 6    Exercise for Strong Bones (from [place]) There are two types of exercises that are important for building and maintaining bone density:  weight-bearing and muscle-strengthening exercises. Weight-bearing Exercises These exercises include activities that make you move against gravity while staying upright. Weight-bearing exercises can be high-impact or low-impact. High-impact weight-bearing exercises help build bones and keep them strong. If you have broken a bone due to osteoporosis or are at risk of breaking a bone, you may need to avoid high-impact exercises. If youre not sure, you should check with your  healthcare provider. Examples of high-impact weight-bearing exercises are:  Dancing  Doing high-impact aerobics  Hiking  Jogging/running  Jumping Rope  Stair climbing  Tennis Low-impact weight-bearing exercises can also help keep bones strong and are a safe alternative if you cannot do high-impact exercises. Examples of low-impact weight-bearing exercises are:  Using elliptical training machines  Doing low-impact aerobics  Using stair-step machines  Fast walking on a treadmill or outside Muscle-Strengthening Exercises These exercises include activities where you move your body, a weight or some other resistance against gravity. They are also known as resistance exercises and include:  Lifting weights  Using elastic exercise bands  Using weight machines  Lifting your own body weight  Functional movements, such as standing and rising up on your toes Yoga and Pilates can also improve strength, balance and flexibility. However, certain positions may not be safe for people with osteoporosis or those at increased risk of broken bones. For example, exercises that have you bend forward may increase the chance of breaking a bone in the spine. A physical therapist should be able to help you learn which exercises are safe and appropriate for you. Non-Impact Exercises Non-impact exercises can help you to improve balance, posture and how well you move in everyday activities. These exercises can also help to increase muscle strength and decrease the risk of falls and broken bones. Some of these exercises include:  Balance exercises that strengthen your legs and test your balance, such as Tai Chi, can decrease your risk of falls.  Posture exercises that improve your posture and reduce rounded or sloping shoulders can help you decrease the chance of breaking a bone, especially in the spine.  Functional exercises that improve how well you move can help you with everyday activities and  decrease your chance of falling and breaking a bone. For example, if you have trouble getting up from a chair or climbing stairs, you should do these activities as exercises. A physical therapist can teach you balance, posture and functional exercises. Starting a New Exercise Program If you havent exercised regularly for a while, check with your healthcare provider before beginning a new exercise program--particularly if you have health problems such as heart disease, diabetes or high blood pressure. If youre at high risk of breaking a bone, you should work with a physical therapist to develop a safe exercise program. Once you have your healthcare providers approval, start slowly. If youve already broken bones in the spine because of osteoporosis, be very careful to avoid activities that require reaching down, bending forward, rapid twisting motions, heavy lifting and those that increase your chance of a fall. As you get started, your muscles may feel sore for a day or two after you exercise. If soreness lasts longer, you may be working too hard and need to ease up. Exercises should be done in a pain-free range of motion. How Much Exercise Do You  Need? Weight-bearing exercises 30 minutes on most days of the week. Do a 30-minutesession or multiple sessions spread out throughout the day. The benefits to your bones are the same.   Muscle-strengthening exercises Two to three days per week. If you dont have much time for strengthening/resistance training, do small amounts at a time. You can do just one body part each day. For example do arms one day, legs the next and trunk the next. You can also spread these exercises out during your normal day.  Balance, posture and functional exercises Every day or as often as needed. You may want to focus on one area more than the others. If you have fallen or lose your balance, spend time doing balance exercises. If you are getting rounded shoulders, work more on  posture exercises. If you have trouble climbing stairs or getting up from the couch, do more functional exercises. You can also perform these exercises at one time or spread them during your day. Work with a phyiscal therapist to learn the right exercises for you.    Denosumab: Patient drug information (Up-to-date) Copyright [phone] [place] rights reserved.  Brand Names: [place]  ProliaDelton See What is this drug used for?  It is used to treat soft, brittle bones (osteoporosis).  It is used for bone growth.  It is used when treating some cancers.  It may be given to you for other reasons. Talk with the doctor. What do I need to tell my doctor BEFORE I take this drug?  All products:  If you have an allergy to denosumab or any other part of this drug.  If you are allergic to any drugs like this one, any other drugs, foods, or other substances. Tell your doctor about the allergy and what signs you had, like rash; hives; itching; shortness of breath; wheezing; cough; swelling of face, lips, tongue, or throat; or any other signs.  If you have low calcium levels.  Prolia:  If you are pregnant or may be pregnant. Do not take this drug if you are pregnant.  This is not a list of all drugs or health problems that interact with this drug.  Tell your doctor and pharmacist about all of your drugs (prescription or OTC, natural products, vitamins) and health problems. You must check to make sure that it is safe for you to take this drug with all of your drugs and health problems. Do not start, stop, or change the dose of any drug without checking with your doctor. What are some things I need to know or do while I take this drug?  All products:  Tell dentists, surgeons, and other doctors that you use this drug.  This drug may raise the chance of a broken leg. Talk with your doctor.  Have your blood work checked. Talk with your doctor.  Have a bone density test. Talk with your  doctor.  Take calcium and vitamin D as you were told by your doctor.  Have a dental exam before starting this drug.  Take good care of your teeth. See a dentist often.  If you smoke, talk with your doctor.  Do not give to a child. Talk with your doctor.  Tell your doctor if you are breast-feeding. You will need to talk about any risks to your baby.  Delton See:  This drug may cause harm to the unborn baby if you take it while you are pregnant. If you get pregnant while taking this drug, call your  doctor right away.  Prolia:  Very bad infections have been reported with use of this drug. If you have any infection, are taking antibiotics now or in the recent past, or have many infections, talk with your doctor.  You may have more chance of getting an infection. Wash hands often. Stay away from people with infections, colds, or flu.  Use birth control that you can trust to prevent pregnancy while taking this drug.  If you are a man and your sex partner is pregnant or gets pregnant at any time while you are being treated, talk with your doctor. What are some side effects that I need to call my doctor about right away?  WARNING/CAUTION: Even though it may be rare, some people may have very bad and sometimes deadly side effects when taking a drug. Tell your doctor or get medical help right away if you have any of the following signs or symptoms that may be related to a very bad side effect:  All products:  Signs of an allergic reaction, like rash; hives; itching; red, swollen, blistered, or peeling skin with or without fever; wheezing; tightness in the chest or throat; trouble breathing or talking; unusual hoarseness; or swelling of the mouth, face, lips, tongue, or throat.  Signs of low calcium levels like muscle cramps or spasms, numbness and tingling, or seizures.  Mouth sores.  Any new or strange groin, hip, or thigh pain.  This drug may cause jawbone problems. The chance may be higher  the longer you take this drug. The chance may be higher if you have cancer, dental problems, dentures that do not fit well, anemia, blood clotting problems, or an infection. The chance may also be higher if you are having dental work or if you are getting chemo, some steroid drugs, or radiation. Call your doctor right away if you have jaw swelling or pain.  Xgeva:  Not hungry.  Muscle pain or weakness.  Seizures.  Shortness of breath.  Prolia:  Signs of infection. These include a fever of 100.1F (38C) or higher, chills, very bad sore throat, ear or sinus pain, cough, more sputum or change in color of sputum, pain with passing urine, mouth sores, wound that will not heal, or anal itching or pain.  Signs of a pancreas problem (pancreatitis) like very bad stomach pain, very bad back pain, or very bad upset stomach or throwing up.  Chest pain.  A heartbeat that does not feel normal.  Very bad skin irritation.  Feeling very tired or weak.  Bladder pain or pain when passing urine or change in how much urine is passed.  Passing urine often.  Swelling in the arms or legs. What are some other side effects of this drug?  All drugs may cause side effects. However, many people have no side effects or only have minor side effects. Call your doctor or get medical help if any of these side effects or any other side effects bother you or do not go away:  Xgeva:  Feeling tired or weak.  Headache.  Upset stomach or throwing up.  Loose stools (diarrhea).  Cough.  Prolia:  Back pain.  Muscle or joint pain.  Sore throat.  Runny nose.  Pain in arms or legs.  These are not all of the side effects that may occur. If you have questions about side effects, call your doctor. Call your doctor for medical advice about side effects.  You may report side effects to your national health agency.  How is this drug best taken?  Use this drug as ordered by your doctor. Read and follow the  dosing on the label closely.  It is given as a shot into the fatty part of the skin. What do I do if I miss a dose?  Call the doctor to find out what to do. How do I store and/or throw out this drug?  This drug will be given to you in a hospital or doctor's office. You will not store it at home.  Keep all drugs out of the reach of children and pets.  Check with your pharmacist about how to throw out unused drugs.  General drug facts  If your symptoms or health problems do not get better or if they become worse, call your doctor.  Do not share your drugs with others and do not take anyone else's drugs.  Keep a list of all your drugs (prescription, natural products, vitamins, OTC) with you. Give this list to your doctor.  Talk with the doctor before starting any new drug, including prescription or OTC, natural products, or vitamins.  Some drugs may have another patient information leaflet. If you have any questions about this drug, please talk with your doctor, pharmacist, or other health care provider.  If you think there has been an overdose, call your poison control center or get medical care right away. Be ready to tell or show what was taken, how much, and when it happened.

## 2016-09-16 ENCOUNTER — Encounter: Payer: Self-pay | Admitting: Internal Medicine

## 2016-09-16 ENCOUNTER — Telehealth: Payer: Self-pay | Admitting: Internal Medicine

## 2016-09-16 ENCOUNTER — Telehealth: Payer: Self-pay

## 2016-09-16 NOTE — Telephone Encounter (Signed)
Called and LVM advising patient to call back to receive her lab work, advised her to make lab appointment when she calls back and gave call back number.

## 2016-09-16 NOTE — Telephone Encounter (Signed)
Patient is calling with question about vit d and Calcium test. She stated she is not interested in taking the Prolia shot at this time. Please advise

## 2016-09-16 NOTE — Telephone Encounter (Signed)
-----   Message from Philemon Kingdom, MD sent at 09/16/2016 11:42 AM EST ----- Almyra Free, can you please call pt: Her vitamin D is very low. I will suggest to increase her vitamin D supplement to 5000 units daily. We'll need another repeat a vitamin D level in 2-3 months. Lab is in. Calcium and kidney tests are normal.

## 2016-09-17 NOTE — Telephone Encounter (Signed)
Yes, noted.

## 2016-09-17 NOTE — Telephone Encounter (Signed)
Patient called [facility] stated she will not be taking the prolia shot today.

## 2016-09-19 NOTE — Telephone Encounter (Signed)
Patient called on 09/16/2016 and stated she would not be receiving the prolia injections.

## 2016-09-19 NOTE — Telephone Encounter (Signed)
Benefits received regarding pts prolia injection. Should pt change her mind; Verification of benefits have been processed and an approval has been received for pts prolia injection. Pts estimated cost are appx $260. This is only an estimate and cannot be confirmed until benefits are paid. Please advise pt and schedule if needed. If scheduled, once the injection is received, pls contact me back with the date it was received so that I am able to update prolia folder. thanks

## 2016-11-10 ENCOUNTER — Other Ambulatory Visit: Payer: Medicare Other

## 2016-11-18 ENCOUNTER — Ambulatory Visit (HOSPITAL_COMMUNITY)
Admission: RE | Admit: 2016-11-18 | Discharge: 2016-11-18 | Disposition: A | Discharge status: Home / Self Care | Payer: Medicare Other | Source: Ambulatory Visit | Attending: Oncology | Admitting: Oncology

## 2016-11-18 ENCOUNTER — Other Ambulatory Visit (HOSPITAL_BASED_OUTPATIENT_CLINIC_OR_DEPARTMENT_OTHER): Payer: Medicare Other

## 2016-11-18 DIAGNOSIS — M5135 Other intervertebral disc degeneration, thoracolumbar region: Secondary | ICD-10-CM | POA: Diagnosis not present

## 2016-11-18 DIAGNOSIS — Z967 Presence of other bone and tendon implants: Secondary | ICD-10-CM | POA: Insufficient documentation

## 2016-11-18 DIAGNOSIS — D472 Monoclonal gammopathy: Secondary | ICD-10-CM | POA: Insufficient documentation

## 2016-11-18 LAB — CBC WITH DIFFERENTIAL/PLATELET
BASO%: 0.5 % (ref 0.0–2.0)
Basophils Absolute: 0 10*3/uL (ref 0.0–0.1)
EOS%: 0.8 % (ref 0.0–7.0)
Eosinophils Absolute: 0.1 10*3/uL (ref 0.0–0.5)
HCT: 36.1 % (ref 34.8–46.6)
HGB: 11.8 g/dL (ref 11.6–15.9)
LYMPH%: 32.9 % (ref 14.0–49.7)
MCH: 31.1 pg (ref 25.1–34.0)
MCHC: 32.7 g/dL (ref 31.5–36.0)
MCV: 95 fL (ref 79.5–101.0)
MONO#: 0.6 10*3/uL (ref 0.1–0.9)
MONO%: 8.1 % (ref 0.0–14.0)
NEUT#: 4.3 10*3/uL (ref 1.5–6.5)
NEUT%: 57.7 % (ref 38.4–76.8)
Platelets: 297 10*3/uL (ref 145–400)
RBC: 3.8 10*6/uL (ref 3.70–5.45)
RDW: 15.2 % — ABNORMAL HIGH (ref 11.2–14.5)
WBC: 7.4 10*3/uL (ref 3.9–10.3)
lymph#: 2.4 10*3/uL (ref 0.9–3.3)

## 2016-11-18 LAB — COMPREHENSIVE METABOLIC PANEL
ALT: 13 U/L (ref 0–55)
AST: 19 U/L (ref 5–34)
Albumin: 3.3 g/dL — ABNORMAL LOW (ref 3.5–5.0)
Alkaline Phosphatase: 60 U/L (ref 40–150)
Anion Gap: 8 mEq/L (ref 3–11)
BUN: 9.9 mg/dL (ref 7.0–26.0)
CO2: 25 mEq/L (ref 22–29)
Calcium: 9.3 mg/dL (ref 8.4–10.4)
Chloride: 102 mEq/L (ref 98–109)
Creatinine: 0.9 mg/dL (ref 0.6–1.1)
EGFR: 66 mL/min/{1.73_m2} — ABNORMAL LOW (ref >90)
Glucose: 111 mg/dl (ref 70–140)
Potassium: 4.3 mEq/L (ref 3.5–5.1)
Sodium: 136 mEq/L (ref 136–145)
Total Bilirubin: 0.54 mg/dL (ref 0.20–1.20)
Total Protein: 10.9 g/dL — ABNORMAL HIGH (ref 6.4–8.3)

## 2016-11-19 LAB — KAPPA/LAMBDA LIGHT CHAINS
Ig Kappa Free Light Chain: 8.5 mg/L (ref 3.3–19.4)
Ig Lambda Free Light Chain: 163.5 mg/L — ABNORMAL HIGH (ref 5.7–26.3)
Kappa/Lambda FluidC Ratio: 0.05 — ABNORMAL LOW (ref 0.26–1.65)

## 2016-11-20 LAB — MULTIPLE MYELOMA PANEL, SERUM
Albumin SerPl Elph-Mcnc: 3.8 g/dL (ref 2.9–4.4)
Albumin/Glob SerPl: 0.7 (ref 0.7–1.7)
Alpha 1: 0.3 g/dL (ref 0.0–0.4)
Alpha2 Glob SerPl Elph-Mcnc: 0.8 g/dL (ref 0.4–1.0)
B-Globulin SerPl Elph-Mcnc: 0.9 g/dL (ref 0.7–1.3)
Gamma Glob SerPl Elph-Mcnc: 4.3 g/dL — ABNORMAL HIGH (ref 0.4–1.8)
Globulin, Total: 6.2 g/dL — ABNORMAL HIGH (ref 2.2–3.9)
IgA, Qn, Serum: 60 mg/dL — ABNORMAL LOW (ref 64–422)
IgG, Qn, Serum: 5507 mg/dL — ABNORMAL HIGH (ref 700–1600)
IgM, Qn, Serum: 38 mg/dL (ref 26–217)
M Protein SerPl Elph-Mcnc: 4.1 g/dL — ABNORMAL HIGH
Total Protein: 10 g/dL — ABNORMAL HIGH (ref 6.0–8.5)

## 2016-11-25 ENCOUNTER — Telehealth: Payer: Self-pay | Admitting: Oncology

## 2016-11-25 ENCOUNTER — Ambulatory Visit (HOSPITAL_BASED_OUTPATIENT_CLINIC_OR_DEPARTMENT_OTHER): Payer: Medicare Other | Admitting: Oncology

## 2016-11-25 VITALS — BP 142/62 | HR 79 | Temp 98.4°F | Resp 18 | Ht 66.5 in | Wt 150.5 lb

## 2016-11-25 DIAGNOSIS — D472 Monoclonal gammopathy: Secondary | ICD-10-CM

## 2016-11-25 DIAGNOSIS — M810 Age-related osteoporosis without current pathological fracture: Secondary | ICD-10-CM

## 2016-11-25 NOTE — Progress Notes (Signed)
Hematology and Oncology Follow Up Visit  Nicole Keith [id number] [birth year] 76 y.o. 11/25/2016 11:07 AM Nicole Keith, [name], [name], MD   Principle Diagnosis: 76 year old woman with IgG lambda plasma cell disorder representing smoldering multiple myeloma. She has no end organ damage or symptomatology. She presented with an M spike of 3.2 g/dL and IgG level of 4685 diagnosed in April 2016. She has 29% plasma cell infiltration in the bone marrow. Skeletal survey did not any bone lesions in 11/2014.    Current therapy: Close observation and consideration to start treatment if she develops symptomatic disease.  Interim History:  Nicole Keith presents today for a follow-up visit. Since her last visit, she reports no major changes in her health. She remains in excellent shape and has not had any recent hospitalization or illnesses. She remains active including playing golf and attending to her activities of daily living.  She has not reported any constitutional symptoms. She denied any fevers or chills or weight loss. She has not reported any fractures or dislocations.    She does not report any  blurry vision, double vision, syncope or seizures. She does not report any fevers, chills, sweats, weight loss or appetite changes. She does not report any chest pain, palpitation, orthopnea or leg edema. She does not report any cough, wheezing, hemoptysis or shortness of breath. She does not report any nausea, vomiting, abdominal pain, satiety. She does not report any constipation, diarrhea. She does not report any frequency, urgency, hesitancy. She does not report any lymphadenopathy or petechiae. Remaining review of systems unremarkable.   Medications: I have reviewed the patient's current medications.  Current Outpatient Prescriptions  Medication Sig Dispense Refill  . Acetaminophen (TYLENOL PO) Take by mouth as needed.    [name] Kitchen CALCIUM CARBONATE-VITAMIN D PO Take by mouth. Calcium- 667m; vitamin d3- 800  units.    . famotidine (PEPCID) 20 MG tablet Take 20 mg by mouth daily.    .[name] Kitchenibuprofen (ADVIL,MOTRIN) 200 MG tablet Take 200 mg by mouth.    . loratadine (CLARITIN) 10 MG tablet Take 10 mg by mouth daily.     No current facility-administered medications for this visit.      Allergies:  Allergies  Allergen Reactions  . Betadine [Povidone Iodine] Rash  . Flagyl [Metronidazole] Diarrhea  . Fosamax [Alendronate Sodium] Other (See Comments)    GI distress  . Valium [Diazepam] Other (See Comments)  . Actonel [Risedronate Sodium] Other (See Comments)    GI distress  . Boniva [Ibandronic Acid] Other (See Comments)    GI distress  . Doxycycline Rash  . Penicillins Rash    Ancef was OK.    Past Medical History, Surgical history, Social history, and Family History were reviewed and updated.   Physical Exam: Blood pressure (!) 142/62, pulse 79, temperature 98.4 F (36.9 C), temperature source Oral, resp. rate 18, height 5' 6.5" (1.689 m), weight 150 lb 8 oz (68.3 kg), SpO2 97 %.   ECOG: 0 General appearance: Well-appearing woman without distress. Head: Normocephalic, without obvious abnormality no oral thrush or ulcers. Neck: no adenopathy Lymph nodes: Cervical, supraclavicular, and axillary nodes normal. Heart:regular rate and rhythm, S1, S2 normal, no murmur, click, rub or gallop Lung:chest clear, no wheezing, rales, normal symmetric air entry Abdomin: soft, non-tender, without masses or organomegaly no shifting dullness or ascites. EXT:no erythema, induration, or nodules Skin: No erythema or induration. No rashes or ecchymosis.  Lab Results: Lab Results  Component Value Date   WBC 7.4 11/18/2016  HGB 11.8 11/18/2016   HCT 36.1 11/18/2016   MCV 95.0 11/18/2016   PLT 297 11/18/2016     Chemistry      Component Value Date/Time   NA 136 11/18/2016 0947   K 4.3 11/18/2016 0947   CL 100 09/15/2016 1155   CO2 25 11/18/2016 0947   BUN 9.9 11/18/2016 0947   CREATININE 0.9  11/18/2016 0947      Component Value Date/Time   CALCIUM 9.3 11/18/2016 0947   ALKPHOS 60 11/18/2016 0947   AST 19 11/18/2016 0947   ALT 13 11/18/2016 0947   BILITOT 0.54 11/18/2016 0947      Results for Nicole Keith, Nicole Keith (MRN [id number]) as of 11/25/2016 10:39  Ref. Range 10/17/2015 09:01 06/11/2016 10:06 11/18/2016 09:47  M Protein SerPl Elph-Mcnc Latest Ref Range: Not Observed g/dL 3.7 (H) 3.8 (H) 4.1 (H)   EXAM: METASTATIC BONE SURVEY  COMPARISON:  10/17/2015  FINDINGS: Frontal view of the chest shows no lytic or blastic lesions. Bilateral forearm and bilateral shoulder shows no lytic or blastic lesions. There is mild spurring of right humeral head. Bilateral humerus shows no lytic or blastic lesions. Lateral view of the scalp demonstrates no lytic or blastic lesions. Mild degenerative changes cervical spine. No lytic or blastic lesions. Lateral view of the thoracic spine shows no lytic or blastic lesions. Diffuse osteopenia. Degenerative changes mid and lower thoracic spine. Lateral view of the lumbar spine shows no lytic or blastic lesions. There is disc space flattening with anterior spurring at L4-L5 level. Mild disc space flattening with anterior spurring at L1-L2 and L3-L4 level. Facet degenerative changes L4 and L5 level. Frontal view of the lumbar spine shows mild upper lumbar levoscoliosis. Frontal view of the pelvis shows no lytic or blastic lesions. There is right hip prosthesis with anatomic alignment. Mild degenerative changes left hip joint. Frontal view bilateral femur shows no lytic or blastic lesions. Again noted metallic fixation plate in proximal left tibia with alignment preserved. No lytic or blastic lesions. Stable benign sclerosis in proximal right tibia. No lytic or blastic lesions.  IMPRESSION: 1. No evidence of lytic or blastic lesions. No significant change. Multilevel degenerative changes as described above. Right hip prosthesis with anatomic  alignment. Stable fixation material proximal left tibia with anatomic alignment. Stable probable benign sclerosis proximal right tibia.   Impression and Plan:  76 year old woman with the following issues:  1. Monoclonal gammopathy presenting with IgG lambda monoclonal protein presented in April 2016 with an M spike of 3.2 g/dL. Her quantitative immunoglobulin showed an IgG level of 4685 and a free lambda light chain of 186. Her initial staging workup did not reveal any end organ damage and 29% plasma cell infiltration. This appears to be smoldering multiple myeloma and currently on observation and surveillance.  Repeat protein studies obtained on 11/18/2016 showed no dramatic changes. Her M spike remains close to 5 g/dL without end organ damage. Skeletal survey did not show any lytic bone lesions.  The natural course of this disease was discussed today and indication for treatment were reviewed. She does have early indication of osteoporosis and possible peripheral neuropathy which may be considered as symptoms related to a plasma cell disorder. These could also be totally unrelated findings. After discussing the risks and benefits of and time myeloma treatment, we have opted to continue active surveillance and institute treatment or if she develops symptomatic disease. At this time we do not feel the symptoms are related to her plasma cell disorder.  If she develops anemia, renal insufficiency, hypercalcemia or lytic bone lesions we will proceed with active treatment.  2. Osteoporosis: She is following with her primary care provider regarding this issue.  3. Follow-up: Will be in November 2018.  [facility], MD 4/17/201811:07 AM

## 2016-11-25 NOTE — Telephone Encounter (Signed)
Gave patient AVS and calender per 4/17 los. Per Dr. Alen Blew said okay for November appt- per patient.

## 2017-03-05 ENCOUNTER — Telehealth: Payer: Self-pay | Admitting: Internal Medicine

## 2017-03-05 NOTE — Telephone Encounter (Signed)
Prolia authorization.  Please call back.  -LL

## 2017-03-06 NOTE — Telephone Encounter (Signed)
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 Thanks!

## 2017-03-17 NOTE — Telephone Encounter (Signed)
Left a vm requesting patient call back to schedule Prolia injection

## 2017-03-17 NOTE — Telephone Encounter (Signed)
Patient scheduled 03/18/17 for Prolia injection- she will be contacting [place] to get assistance with co-pay I gave her the number

## 2017-03-23 NOTE — Telephone Encounter (Signed)
This has been noted thank you

## 2017-03-23 NOTE — Telephone Encounter (Signed)
Patient calling to inform that she is not open to taking Prolia injection. She wants to be "taken off the list" for Prolia.  Thank you,  -LL

## 2017-03-23 NOTE — Telephone Encounter (Signed)
Just an FYI patient is not interested in receiving Prolia

## 2017-06-11 ENCOUNTER — Other Ambulatory Visit (HOSPITAL_BASED_OUTPATIENT_CLINIC_OR_DEPARTMENT_OTHER): Payer: Medicare Other

## 2017-06-11 DIAGNOSIS — D472 Monoclonal gammopathy: Secondary | ICD-10-CM | POA: Diagnosis not present

## 2017-06-11 LAB — CBC WITH DIFFERENTIAL/PLATELET
BASO%: 1 % (ref 0.0–2.0)
Basophils Absolute: 0.1 10*3/uL (ref 0.0–0.1)
EOS%: 1.7 % (ref 0.0–7.0)
Eosinophils Absolute: 0.1 10*3/uL (ref 0.0–0.5)
HCT: 32.2 % — ABNORMAL LOW (ref 34.8–46.6)
HGB: 10.8 g/dL — ABNORMAL LOW (ref 11.6–15.9)
LYMPH%: 28.7 % (ref 14.0–49.7)
MCH: 32.1 pg (ref 25.1–34.0)
MCHC: 33.6 g/dL (ref 31.5–36.0)
MCV: 95.4 fL (ref 79.5–101.0)
MONO#: 0.5 10*3/uL (ref 0.1–0.9)
MONO%: 6.5 % (ref 0.0–14.0)
NEUT#: 4.4 10*3/uL (ref 1.5–6.5)
NEUT%: 62.1 % (ref 38.4–76.8)
Platelets: 321 10*3/uL (ref 145–400)
RBC: 3.37 10*6/uL — ABNORMAL LOW (ref 3.70–5.45)
RDW: 15.6 % — ABNORMAL HIGH (ref 11.2–14.5)
WBC: 7.2 10*3/uL (ref 3.9–10.3)
lymph#: 2.1 10*3/uL (ref 0.9–3.3)

## 2017-06-11 LAB — COMPREHENSIVE METABOLIC PANEL
ALT: 13 U/L (ref 0–55)
AST: 21 U/L (ref 5–34)
Albumin: 2.9 g/dL — ABNORMAL LOW (ref 3.5–5.0)
Alkaline Phosphatase: 72 U/L (ref 40–150)
Anion Gap: 5 mEq/L (ref 3–11)
BUN: 12.8 mg/dL (ref 7.0–26.0)
CO2: 26 mEq/L (ref 22–29)
Calcium: 8.8 mg/dL (ref 8.4–10.4)
Chloride: 104 mEq/L (ref 98–109)
Creatinine: 0.8 mg/dL (ref 0.6–1.1)
EGFR: >60 mL/min/{1.73_m2} (ref >60)
Glucose: 103 mg/dl (ref 70–140)
Potassium: 4 mEq/L (ref 3.5–5.1)
Sodium: 135 mEq/L — ABNORMAL LOW (ref 136–145)
Total Bilirubin: 0.35 mg/dL (ref 0.20–1.20)
Total Protein: 11.2 g/dL — ABNORMAL HIGH (ref 6.4–8.3)

## 2017-06-12 LAB — KAPPA/LAMBDA LIGHT CHAINS
Ig Kappa Free Light Chain: 6.8 mg/L (ref 3.3–19.4)
Ig Lambda Free Light Chain: 299.2 mg/L — ABNORMAL HIGH (ref 5.7–26.3)
Kappa/Lambda FluidC Ratio: 0.02 — ABNORMAL LOW (ref 0.26–1.65)

## 2017-06-15 LAB — MULTIPLE MYELOMA PANEL, SERUM
Albumin SerPl Elph-Mcnc: 3.6 g/dL (ref 2.9–4.4)
Albumin/Glob SerPl: 0.6 — ABNORMAL LOW (ref 0.7–1.7)
Alpha 1: 0.2 g/dL (ref 0.0–0.4)
Alpha2 Glob SerPl Elph-Mcnc: 0.7 g/dL (ref 0.4–1.0)
B-Globulin SerPl Elph-Mcnc: 0.9 g/dL (ref 0.7–1.3)
Gamma Glob SerPl Elph-Mcnc: 5.1 g/dL — ABNORMAL HIGH (ref 0.4–1.8)
Globulin, Total: 6.9 g/dL — ABNORMAL HIGH (ref 2.2–3.9)
IgA, Qn, Serum: 53 mg/dL — ABNORMAL LOW (ref 64–422)
IgG, Qn, Serum: 6342 mg/dL — ABNORMAL HIGH (ref 700–1600)
IgM, Qn, Serum: 31 mg/dL (ref 26–217)
M Protein SerPl Elph-Mcnc: 4.7 g/dL — ABNORMAL HIGH
Total Protein: 10.5 g/dL — ABNORMAL HIGH (ref 6.0–8.5)

## 2017-06-18 ENCOUNTER — Ambulatory Visit (HOSPITAL_BASED_OUTPATIENT_CLINIC_OR_DEPARTMENT_OTHER): Payer: Medicare Other | Admitting: Oncology

## 2017-06-18 ENCOUNTER — Telehealth: Payer: Self-pay

## 2017-06-18 VITALS — BP 147/70 | HR 77 | Temp 98.7°F | Resp 16 | Wt 143.0 lb

## 2017-06-18 DIAGNOSIS — D472 Monoclonal gammopathy: Secondary | ICD-10-CM

## 2017-06-18 DIAGNOSIS — D729 Disorder of white blood cells, unspecified: Secondary | ICD-10-CM

## 2017-06-18 DIAGNOSIS — F419 Anxiety disorder, unspecified: Secondary | ICD-10-CM

## 2017-06-18 DIAGNOSIS — M810 Age-related osteoporosis without current pathological fracture: Secondary | ICD-10-CM

## 2017-06-18 NOTE — Progress Notes (Signed)
Hematology and Oncology Follow Up Visit  Nicole Keith [id number] 22-Jun-1941 76 y.o. 06/18/2017 8:46 AM [name], [name], [name], MD   Principle Diagnosis: 76 year old woman with IgG lambda plasma cell disorder representing smoldering multiple myeloma. She has no end organ damage or symptomatology. She presented with an M spike of 3.2 g/dL and IgG level of 4685 diagnosed in April 2016. She has 29% plasma cell infiltration in the bone marrow. Skeletal survey did not any bone lesions in 11/2014.    Current therapy: Close observation and consideration to start treatment if she develops symptomatic disease.  Interim History:  Nicole Keith presents today for a follow-up visit. Since her last visit, she reports no recent complaints.  He remains reasonably active and golfs regularly with her husband.  She denies any pathological fractures or bone pain.  She does report periodic chest discomfort when swinging her golf club.  She does report anxiety issues which has not changed. She has not had any recent hospitalization or illnesses. She has not reported any constitutional symptoms. She denied any fevers or chills or weight loss. She has not reported any fractures or dislocations.  She continues to have excellent appetite and weight is stable.   She does not report any  blurry vision, double vision, syncope or seizures. She does not report any fevers, chills, sweats, weight loss or appetite changes. She does not report any chest pain, palpitation, orthopnea or leg edema. She does not report any cough, wheezing, hemoptysis or shortness of breath. She does not report any nausea, vomiting, abdominal pain, satiety. She does not report any constipation, diarrhea. She does not report any frequency, urgency, hesitancy. She does not report any lymphadenopathy or petechiae. Remaining review of systems unremarkable.   Medications: I have reviewed the patient's current medications.  Current Outpatient  Medications  Medication Sig Dispense Refill  . famotidine (PEPCID) 20 MG tablet Take 20 mg by mouth daily.    [name] Kitchen ibuprofen (ADVIL,MOTRIN) 200 MG tablet Take 400 mg 3 (three) times daily by mouth.      No current facility-administered medications for this visit.      Allergies:  Allergies  Allergen Reactions  . Betadine [Povidone Iodine] Rash  . Flagyl [Metronidazole] Diarrhea  . Fosamax [Alendronate Sodium] Other (See Comments)    GI distress  . Valium [Diazepam] Other (See Comments)  . Actonel [Risedronate Sodium] Other (See Comments)    GI distress  . Boniva [Ibandronic Acid] Other (See Comments)    GI distress  . Doxycycline Rash  . Penicillins Rash    Ancef was OK.    Past Medical History, Surgical history, Social history, and Family History were reviewed and updated.   Physical Exam: Blood pressure (!) 147/70, pulse 77, temperature 98.7 F (37.1 C), temperature source Oral, resp. rate 16, weight 143 lb (64.9 kg), SpO2 97 %.   ECOG: 0 General appearance: Alert, awake, and without distress. Head: Normocephalic, without obvious abnormality no oral ulcers or lesions. Neck: no adenopathy Lymph nodes: Cervical, supraclavicular, and axillary nodes normal. Heart:regular rate and rhythm, S1, S2 normal, no murmur, click, rub or gallop Lung:chest clear, no wheezing, rales, normal symmetric air entry Abdomin: soft, non-tender, without masses or organomegaly no rebound or guarding. EXT:no erythema, induration, or nodules Skin: No erythema or induration. No rashes or ecchymosis.  Lab Results: Lab Results  Component Value Date   WBC 7.2 06/11/2017   HGB 10.8 (L) 06/11/2017   HCT 32.2 (L) 06/11/2017   MCV 95.4 06/11/2017  PLT 321 06/11/2017     Chemistry      Component Value Date/Time   NA 135 (L) 06/11/2017 0753   K 4.0 06/11/2017 0753   CL 100 09/15/2016 1155   CO2 26 06/11/2017 0753   BUN 12.8 06/11/2017 0753   CREATININE 0.8 06/11/2017 0753      Component  Value Date/Time   CALCIUM 8.8 06/11/2017 0753   ALKPHOS 72 06/11/2017 0753   AST 21 06/11/2017 0753   ALT 13 06/11/2017 0753   BILITOT 0.35 06/11/2017 0753     Results for Nicole Keith, Nicole Keith (MRN [id number]) as of 06/18/2017 08:05  Ref. Range 06/11/2016 10:06 11/18/2016 09:47 06/11/2017 07:53  M Protein SerPl Elph-Mcnc Latest Ref Range: Not Observed g/dL 3.8 (H) 4.1 (H) 4.7 (H)     Impression and Plan:  76 year old woman with the following issues:  1. Monoclonal gammopathy presenting with IgG lambda monoclonal protein presented in April 2016 with an M spike of 3.2 g/dL. Her quantitative immunoglobulin showed an IgG level of 4685 and a free lambda light chain of 186. Her initial staging workup did not reveal any end organ damage and 29% plasma cell infiltration. This appears to be smoldering multiple myeloma and currently on observation and surveillance.  Repeat protein studies obtained on June 11, 2017 were reviewed and showed slight increase in her protein studies.  She does not have any evidence of endorgan damage or symptoms at this time.  The natural course of this disease was discussed today and indication for treatment were reviewed.  She does report neurological symptoms that could be attributed to a plasma cell disorder as well as mild osteoporosis.  Her hemoglobin has drifted slightly but remains close to baseline.  Risks and benefits of starting antibiotic, therapy was reviewed again and we opted to defer that option and continue to observe for the time being.  She understands she is she has high risk disease and she will likely require active myeloma therapy.  We will continue to follow her closely and repeat staging workup in 4-5 months.    2. Osteoporosis: She is following with her primary care provider regarding this issue.  3. Follow-up: Will be April 2018.  Zola Button, MD 11/8/20188:46 AM

## 2017-06-18 NOTE — Telephone Encounter (Signed)
Printed avs and calender for upcoming appointment. Per 11/8 los 

## 2017-06-30 ENCOUNTER — Other Ambulatory Visit: Payer: Self-pay | Admitting: Family Medicine

## 2017-06-30 DIAGNOSIS — R0989 Other specified symptoms and signs involving the circulatory and respiratory systems: Secondary | ICD-10-CM

## 2017-07-07 ENCOUNTER — Encounter: Payer: Self-pay | Admitting: Neurology

## 2017-07-07 ENCOUNTER — Ambulatory Visit: Payer: Medicare Other | Admitting: Neurology

## 2017-07-07 VITALS — BP 130/64 | HR 82 | Ht 66.5 in | Wt 147.2 lb

## 2017-07-07 DIAGNOSIS — R202 Paresthesia of skin: Secondary | ICD-10-CM | POA: Diagnosis not present

## 2017-07-07 DIAGNOSIS — R200 Anesthesia of skin: Secondary | ICD-10-CM | POA: Diagnosis not present

## 2017-07-07 DIAGNOSIS — D472 Monoclonal gammopathy: Secondary | ICD-10-CM

## 2017-07-07 NOTE — Progress Notes (Signed)
NEUROLOGY CONSULTATION NOTE  CANDI PROFIT MRN: [id number] DOB: 09-08-1940  Referring provider: Dr. Orland Mustard Primary care provider: Dr. Orland Mustard  Reason for consult:  Numbness and tingling  HISTORY OF PRESENT ILLNESS: Nicole Keith is a 76 year old right-handed female with MGUS who presents with numbness and tingling.  History supplemented by PCP note.  She was diagnosed with MGUS about 2 years ago.  She has been monitored by Heme/Onc and has thus far been negative for multiple myeloma.  About 8 months ago, she began experiencing an "internal tremor" in her chest.  She also reports sensation of numbness and tingling down both arms, particularly involving the right hand.  It involves all fingers. She only really notices it at night in bed when laying on her right side or on her back.  She denies neck pain but has history of bulging discs at C6 and C7.  She denies radicular pain down the arms or weakness.  She denies symptoms in the legs and feet.  She also has a pruritic rash on her arms and legs, treated by topic Kenalog but her dermatologist.  She reports associated anxiety.  06/11/17 CMP: Na 135, K 4, Cl 104, CO2 26, glucose 103, BUN 12.8, Cr 0.8, t bili 0.35, ALP 72, AST 21 and ALT 13.  PAST MEDICAL HISTORY: Past Medical History:  Diagnosis Date  . Allergic rhinitis   . Arthritis   . Colitis, collagenous   . Diverticular disease    severe sig tics/fixation 2012  . Family hx of colon cancer   . Fracture of femoral neck, right ([place]) 01/09/2013  . GERD (gastroesophageal reflux disease)    exacerbation of reflux-like symptoms 04/2011  . Hiatal hernia   . Hyperlipidemia   . Osteoarthritis of right hip 01/10/2013    PAST SURGICAL HISTORY: Past Surgical History:  Procedure Laterality Date  . CHOLECYSTECTOMY    . EYE SURGERY  feb 2016   cataract right eye  . LEG SURGERY Left   . TONSILLECTOMY    . TOTAL HIP ARTHROPLASTY Right 01/10/2013   Procedure: TOTAL HIP ARTHROPLASTY;  Surgeon:  Johnny Bridge, MD;  Location: [place];  Service: Orthopedics;  Laterality: Right;    MEDICATIONS: Current Outpatient Medications on File Prior to Visit  Medication Sig Dispense Refill  . calcium acetate (PHOSLO) 667 MG capsule Take by mouth 3 (three) times daily with meals.    . cholecalciferol (VITAMIN D) 1000 units tablet Take 1,000 Units by mouth daily.    . Probiotic Product (ALIGN PO) Take by mouth.    . famotidine (PEPCID) 20 MG tablet Take 20 mg by mouth daily.    [name] Kitchen ibuprofen (ADVIL,MOTRIN) 200 MG tablet Take 400 mg 3 (three) times daily by mouth.      No current facility-administered medications on file prior to visit.     ALLERGIES: Allergies  Allergen Reactions  . Betadine [Povidone Iodine] Rash  . Flagyl [Metronidazole] Diarrhea  . Fosamax [Alendronate Sodium] Other (See Comments)    GI distress  . Bactrim [Sulfamethoxazole-Trimethoprim]     nausea  . Doxepin Hcl     Irritable, hallucinations  . Hydroxyzine Hcl     hallucinations  . Valium [Diazepam] Other (See Comments)  . Actonel [Risedronate Sodium] Other (See Comments)    GI distress  . Boniva [Ibandronic Acid] Other (See Comments)    GI distress  . Doxycycline Rash  . Penicillins Rash    Ancef was OK.    FAMILY HISTORY: Family History  Problem Relation Age of Onset  . Congestive Heart Failure Mother   . Arthritis-Osteo Mother   . Colon cancer Mother   . Heart attack Father   . Rheum arthritis Father   . Colon cancer Maternal Aunt   . Colon cancer Maternal Aunt   . Hypothyroidism Sister     SOCIAL HISTORY: Social History   Socioeconomic History  . Marital status: Married    Spouse name: Not on file  . Number of children: 0  . Years of education: Not on file  . Highest education level: Some college, no degree  Social Needs  . Financial resource strain: Not on file  . Food insecurity - worry: Not on file  . Food insecurity - inability: Not on file  . Transportation needs - medical: Not on  file  . Transportation needs - non-medical: Not on file  Occupational History  . Occupation: retired  Tobacco Use  . Smoking status: Former Smoker    Last attempt to quit: 01/10/1972    Years since quitting: 45.5  . Smokeless tobacco: Never Used  . Tobacco comment: quit 1973  Substance and Sexual Activity  . Alcohol use: No  . Drug use: No  . Sexual activity: Not on file  Other Topics Concern  . Not on file  Social History Narrative   Married, no children. Drinks 2 cups decaf coffee a day. Walks daily. Is active, plays golf, she and her husband go hiking frequently. 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    REVIEW OF SYSTEMS: Constitutional: No fevers, chills, or sweats, no generalized fatigue, change in appetite Eyes: No visual changes, double vision, eye pain Ear, nose and throat: No hearing loss, ear pain, nasal congestion, sore throat Cardiovascular: No chest pain, palpitations Respiratory:  No shortness of breath at rest or with exertion, wheezes GastrointestinaI: No nausea, vomiting, diarrhea, abdominal pain, fecal incontinence Genitourinary:  No dysuria, urinary retention or frequency Musculoskeletal:  No neck pain, back pain Integumentary: No rash, pruritus, skin lesions Neurological: as above Psychiatric: anxiety Endocrine: No palpitations, fatigue, diaphoresis, mood swings, change in appetite, change in weight, increased thirst Hematologic/Lymphatic:  No purpura, petechiae. Allergic/Immunologic: no itchy/runny eyes, nasal congestion, recent allergic reactions, rashes  PHYSICAL EXAM: Vitals:   07/07/17 0954  BP: 130/64  Pulse: 82  SpO2: 96%   General: No acute distress.  Patient appears well-groomed.  Head:  Normocephalic/atraumatic Eyes:  fundi examined but not visualized Neck: supple, no paraspinal tenderness, full range of motion Back: No paraspinal tenderness Heart: regular rate and rhythm Lungs: Clear to auscultation  bilaterally. Vascular: No carotid bruits. Neurological Exam: Mental status: alert and oriented to person, place, and time, recent and remote memory intact, fund of knowledge intact, attention and concentration intact, speech fluent and not dysarthric, language intact. Cranial nerves: CN I: not tested CN II: pupils equal, round and reactive to light, visual fields intact CN III, IV, VI:  full range of motion, no nystagmus, no ptosis CN V: facial sensation intact CN VII: upper and lower face symmetric CN VIII: hearing intact CN IX, X: gag intact, uvula midline CN XI: sternocleidomastoid and trapezius muscles intact CN XII: tongue midline Bulk & Tone: normal, no fasciculations. Motor:  5/5 throughout  Sensation:  Pinprick and vibration sensation intact. Deep Tendon Reflexes:  2+ throughout, toes downgoing.  Finger to nose testing:  Without dysmetria.  Heel to shin:  Without dysmetria.  Gait:  Normal station and stride.  Able to turn and tandem walk. Romberg  negative. Positive Tinel's sign on right wrist.  IMPRESSION: 1.  Upper extremity numbness in arms and hands may be carpal tunnel syndrome 2.  The "internal tremor" in her chest and upper arms is not likely neurologic  PLAN: 1.  We will check NCV-EMG of upper extremities 2.  We will also check B12 level 3.  In meantime, recommend wearing wrist splints at night to see if it helps with numbness in hands. 4.  Further recommendations pending results.  Thank you for allowing me to take part in the care of this patient.  Metta Clines, DO  CC:  [name], MD

## 2017-07-07 NOTE — Patient Instructions (Signed)
1.  We will check B12 level 2.  We will order a nerve test of the upper extremities 3.  In the meantime, try wearing wrist splints on each arm at night time (you can buy them at the pharmacy) 4.  Further recommendations pending results.

## 2017-07-08 ENCOUNTER — Telehealth: Payer: Self-pay | Admitting: Neurology

## 2017-07-08 NOTE — Telephone Encounter (Signed)
Pt called and said the blood work Dr Tomi Likens ordered she said she has already had it done by Dr Orland Mustard on 06/29/2017 and pt said it can be pulled up, pt would like a call back regarding this

## 2017-07-08 NOTE — Telephone Encounter (Signed)
Pt called to question if we were able to access labs from PCP at [facility], advsd her we are unable to access that group unfortunately. Pt will have labs faxed to my attention from PCP.

## 2017-07-09 ENCOUNTER — Ambulatory Visit
Admission: RE | Admit: 2017-07-09 | Discharge: 2017-07-09 | Disposition: A | Discharge status: Home / Self Care | Payer: Medicare Other | Source: Ambulatory Visit | Attending: Family Medicine | Admitting: Family Medicine

## 2017-07-09 DIAGNOSIS — R0989 Other specified symptoms and signs involving the circulatory and respiratory systems: Secondary | ICD-10-CM

## 2017-07-09 NOTE — Telephone Encounter (Signed)
Rcvd labs from PCP

## 2017-07-16 ENCOUNTER — Other Ambulatory Visit: Payer: Self-pay | Admitting: Family Medicine

## 2017-07-16 ENCOUNTER — Ambulatory Visit
Admission: RE | Admit: 2017-07-16 | Discharge: 2017-07-16 | Disposition: A | Discharge status: Home / Self Care | Payer: Medicare Other | Source: Ambulatory Visit | Attending: Family Medicine | Admitting: Family Medicine

## 2017-07-16 DIAGNOSIS — R0781 Pleurodynia: Secondary | ICD-10-CM

## 2017-07-23 ENCOUNTER — Encounter: Payer: Medicare Other | Admitting: Neurology

## 2017-07-28 ENCOUNTER — Ambulatory Visit: Payer: Medicare Other | Admitting: Neurology

## 2017-07-28 DIAGNOSIS — G5601 Carpal tunnel syndrome, right upper limb: Secondary | ICD-10-CM

## 2017-07-28 NOTE — Procedures (Signed)
[facility]  [address], [address]  [place], [place] [address] Tel: [phone] Fax:  [phone] Test Date:  07/28/2017  Patient: Nicole Keith DOB: Oct 29, 1940 Physician: Narda Amber, DO  Sex: Female Height: 5\' 6"  Ref Phys: Metta Clines, D.O.  ID#: [id number] Temp: 36.0C Technician:    Patient Complaints: This is a 76 year-old female with history of MGUS referred for evaluation of bilateral hand numbness.  NCV & EMG Findings: Extensive electrodiagnostic testing of the right upper extremity and additional studies of the left shows:  1. Right median sensory response shows prolonged distal peak latency (3.9 ms) and reduced amplitude (8.4 V).  Left median, mixed palmer, and bilateral ulnar sensory responses are within normal limits.  2. Bilateral median and right ulnar motor responses are within normal limits. The left ulnar motor response shows an absolute conduction velocity slowing across the elbow.  3. There is no evidence of active or chronic motor axon loss changes affecting any of the tested muscles. Motor unit configuration and recruitment pattern is within normal limits.   Impression: 1. Right median neuropathy at or distal to the wrist, consistent with the clinical diagnosis of carpal tunnel syndrome. Overall, these findings are moderate in degree electrically. 2. Left ulnar neuropathy with slowing across the elbow, purely demyelinating in type and mild in degree electrically.     ___________________________ Narda Amber, DO    Nerve Conduction Studies Anti Sensory Summary Table   Stim Site NR Peak (ms) Norm Peak (ms) P-T Amp (V) Norm P-T Amp  Left Median Anti Sensory (2nd Digit)  Wrist    3.1 <3.8 23.0 >10  Right Median Anti Sensory (2nd Digit)  37C  Wrist    3.9 <3.8 8.4 >10  Left Ulnar Anti Sensory (5th Digit)  37C  Wrist    2.8 <3.2 16.6 >5  Right Ulnar Anti Sensory (5th Digit)  37C  Wrist    2.5 <3.2 22.0 >5   Motor Summary Table   Stim Site NR Onset (ms) Norm Onset (ms) O-P Amp (mV) Norm O-P Amp Site1 Site2 Delta-0 (ms) Dist (cm) Vel (m/s) Norm Vel (m/s)  Left Median Motor (Abd Poll Brev)  37C  Wrist    3.0 <4.0 8.3 >5 Elbow Wrist 4.5 28.0 62 >50  Elbow    7.5  8.0         Right Median Motor (Abd Poll Brev)  37C  Wrist    3.6 <4.0 5.1 >5 Elbow Wrist 4.8 30.0 62 >50  Elbow    8.4  4.5         Left Ulnar Motor (Abd Dig Minimi)  37C  Wrist    2.2 <3.1 8.6 >7 B Elbow Wrist 3.3 23.0 70 >50  B Elbow    5.5  8.6  A Elbow B Elbow 1.9 10.0 53 >50  A Elbow    7.4  8.5         Right Ulnar Motor (Abd Dig Minimi)  37C  Wrist    2.3 <3.1 8.4 >7 B Elbow Wrist 3.4 22.0 65 >50  B Elbow    5.7  8.0  A Elbow B Elbow 1.7 10.0 59 >50  A Elbow    7.4  7.5          Comparison Summary Table   Stim Site NR Peak (ms) Norm Peak (ms) P-T Amp (V) Site1 Site2 Delta-P (ms) Norm Delta (ms)  Left Median/Ulnar Palm Comparison (Wrist - 8cm)  37C  Median [employer]  1.7 <2.2 60.8 Median Palm Ulnar Palm 0.1   Ulnar Palm    1.6 <2.2 10.0       EMG   Side Muscle Ins Act Fibs Psw Fasc Number Recrt Dur Dur. Amp Amp. Poly Poly. Comment  Left 1stDorInt Nml Nml Nml Nml Nml Nml Nml Nml Nml Nml Nml Nml N/A  Left PronatorTeres Nml Nml Nml Nml Nml Nml Nml Nml Nml Nml Nml Nml N/A  Left Biceps Nml Nml Nml Nml Nml Nml Nml Nml Nml Nml Nml Nml N/A  Left Triceps Nml Nml Nml Nml Nml Nml Nml Nml Nml Nml Nml Nml N/A  Left Deltoid Nml Nml Nml Nml Nml Nml Nml Nml Nml Nml Nml Nml N/A  Left FlexCarpiUln Nml Nml Nml Nml Nml Nml Nml Nml Nml Nml Nml Nml N/A  Right 1stDorInt Nml Nml Nml Nml Nml Nml Nml Nml Nml Nml Nml Nml N/A  Right Abd Poll Brev Nml Nml Nml Nml Nml Nml Nml Nml Nml Nml Nml Nml N/A  Right PronatorTeres Nml Nml Nml Nml Nml Nml Nml Nml Nml Nml Nml Nml N/A  Right Biceps Nml Nml Nml Nml Nml Nml Nml Nml Nml Nml Nml Nml N/A  Right Triceps Nml Nml Nml Nml Nml Nml Nml Nml Nml Nml Nml Nml N/A  Right Deltoid Nml Nml Nml Nml Nml Nml Nml Nml Nml Nml Nml Nml N/A       Waveforms:

## 2017-07-29 ENCOUNTER — Telehealth: Payer: Self-pay | Admitting: Neurology

## 2017-07-29 ENCOUNTER — Telehealth: Payer: Self-pay

## 2017-07-29 NOTE — Telephone Encounter (Signed)
Patient lmom returning your call. Thanks  °

## 2017-07-29 NOTE — Telephone Encounter (Signed)
-----   Message from Pieter Partridge, DO sent at 07/29/2017 11:44 AM EST ----- Nerve test does confirm carpal tunnel syndrome in the right hand.  The left hand reveals some mild pinching of the ulnar nerve across the elbow, but I don't think that is the cause of the numbness.  She probably has very mild carpal tunnel syndrome in the left hand which is not picked up by the test.  I would continue using wrist splints at night for both hands.  If that is ineffective, then I would refer to a hand surgeon/specialist for further evaluation and treatment.

## 2017-07-29 NOTE — Telephone Encounter (Signed)
LM for Pt to rtrn call concerning EMG results

## 2017-07-30 NOTE — Telephone Encounter (Signed)
Patient called back

## 2017-07-30 NOTE — Telephone Encounter (Signed)
Spoke with Pt, advsd of EMG results. Pt will call back if wants surgical referral

## 2017-11-11 ENCOUNTER — Ambulatory Visit (HOSPITAL_COMMUNITY)
Admission: RE | Admit: 2017-11-11 | Discharge: 2017-11-11 | Disposition: A | Discharge status: Home / Self Care | Payer: Medicare Other | Source: Ambulatory Visit | Attending: Oncology | Admitting: Oncology

## 2017-11-11 ENCOUNTER — Inpatient Hospital Stay: Payer: Medicare Other | Attending: Oncology

## 2017-11-11 DIAGNOSIS — M810 Age-related osteoporosis without current pathological fracture: Secondary | ICD-10-CM | POA: Diagnosis not present

## 2017-11-11 DIAGNOSIS — D649 Anemia, unspecified: Secondary | ICD-10-CM | POA: Diagnosis not present

## 2017-11-11 DIAGNOSIS — D472 Monoclonal gammopathy: Secondary | ICD-10-CM | POA: Insufficient documentation

## 2017-11-11 DIAGNOSIS — D729 Disorder of white blood cells, unspecified: Secondary | ICD-10-CM

## 2017-11-11 DIAGNOSIS — M8588 Other specified disorders of bone density and structure, other site: Secondary | ICD-10-CM | POA: Diagnosis not present

## 2017-11-11 LAB — COMPREHENSIVE METABOLIC PANEL
ALT: 18 U/L (ref 0–55)
AST: 21 U/L (ref 5–34)
Albumin: 2.9 g/dL — ABNORMAL LOW (ref 3.5–5.0)
Alkaline Phosphatase: 64 U/L (ref 40–150)
Anion gap: 6 (ref 3–11)
BUN: 12 mg/dL (ref 7–26)
CO2: 25 mmol/L (ref 22–29)
Calcium: 8.9 mg/dL (ref 8.4–10.4)
Chloride: 104 mmol/L (ref 98–109)
Creatinine, Ser: 0.87 mg/dL (ref 0.60–1.10)
GFR calc Af Amer: >60 mL/min (ref >60)
GFR calc non Af Amer: >60 mL/min (ref >60)
Glucose, Bld: 98 mg/dL (ref 70–140)
Potassium: 3.9 mmol/L (ref 3.5–5.1)
Sodium: 135 mmol/L — ABNORMAL LOW (ref 136–145)
Total Bilirubin: 0.4 mg/dL (ref 0.2–1.2)
Total Protein: 12.1 g/dL — ABNORMAL HIGH (ref 6.4–8.3)

## 2017-11-11 LAB — CBC WITH DIFFERENTIAL/PLATELET
Basophils Absolute: 0 10*3/uL (ref 0.0–0.1)
Basophils Relative: 0 %
Eosinophils Absolute: 0.1 10*3/uL (ref 0.0–0.5)
Eosinophils Relative: 1 %
HCT: 32.8 % — ABNORMAL LOW (ref 34.8–46.6)
Hemoglobin: 10.3 g/dL — ABNORMAL LOW (ref 11.6–15.9)
Lymphocytes Relative: 24 %
Lymphs Abs: 1.9 10*3/uL (ref 0.9–3.3)
MCH: 30.6 pg (ref 25.1–34.0)
MCHC: 31.4 g/dL — ABNORMAL LOW (ref 31.5–36.0)
MCV: 97.3 fL (ref 79.5–101.0)
Monocytes Absolute: 0.7 10*3/uL (ref 0.1–0.9)
Monocytes Relative: 9 %
Neutro Abs: 5.2 10*3/uL (ref 1.5–6.5)
Neutrophils Relative %: 66 %
Platelets: 381 10*3/uL (ref 145–400)
RBC: 3.37 MIL/uL — ABNORMAL LOW (ref 3.70–5.45)
RDW: 17.2 % — ABNORMAL HIGH (ref 11.2–14.5)
WBC: 7.9 10*3/uL (ref 3.9–10.3)

## 2017-11-12 LAB — KAPPA/LAMBDA LIGHT CHAINS
Kappa free light chain: 10.3 mg/L (ref 3.3–19.4)
Kappa, lambda light chain ratio: 0.04 — ABNORMAL LOW (ref 0.26–1.65)
Lambda free light chains: 287.2 mg/L — ABNORMAL HIGH (ref 5.7–26.3)

## 2017-11-16 LAB — MULTIPLE MYELOMA PANEL, SERUM
Albumin SerPl Elph-Mcnc: 4 g/dL (ref 2.9–4.4)
Albumin/Glob SerPl: 0.6 — ABNORMAL LOW (ref 0.7–1.7)
Alpha 1: 0.4 g/dL (ref 0.0–0.4)
Alpha2 Glob SerPl Elph-Mcnc: 0.8 g/dL (ref 0.4–1.0)
B-Globulin SerPl Elph-Mcnc: 1 g/dL (ref 0.7–1.3)
Gamma Glob SerPl Elph-Mcnc: 5 g/dL — ABNORMAL HIGH (ref 0.4–1.8)
Globulin, Total: 7.2 g/dL — ABNORMAL HIGH (ref 2.2–3.9)
IgA: 36 mg/dL — ABNORMAL LOW (ref 64–422)
IgG (Immunoglobin G), Serum: 7575 mg/dL — ABNORMAL HIGH (ref 700–1600)
IgM (Immunoglobulin M), Srm: 30 mg/dL (ref 26–217)
M Protein SerPl Elph-Mcnc: 4.7 g/dL — ABNORMAL HIGH
Total Protein ELP: 11.2 g/dL — ABNORMAL HIGH (ref 6.0–8.5)

## 2017-11-18 ENCOUNTER — Inpatient Hospital Stay: Payer: Medicare Other

## 2017-11-18 ENCOUNTER — Telehealth: Payer: Self-pay

## 2017-11-18 ENCOUNTER — Inpatient Hospital Stay (HOSPITAL_BASED_OUTPATIENT_CLINIC_OR_DEPARTMENT_OTHER): Payer: Medicare Other | Admitting: Oncology

## 2017-11-18 VITALS — BP 147/66 | HR 73 | Temp 98.8°F | Resp 16 | Ht 66.5 in | Wt 140.1 lb

## 2017-11-18 DIAGNOSIS — M810 Age-related osteoporosis without current pathological fracture: Secondary | ICD-10-CM

## 2017-11-18 DIAGNOSIS — D649 Anemia, unspecified: Secondary | ICD-10-CM

## 2017-11-18 DIAGNOSIS — D472 Monoclonal gammopathy: Secondary | ICD-10-CM

## 2017-11-18 LAB — IRON AND TIBC
Iron: 46 ug/dL (ref 41–142)
Saturation Ratios: 22 % (ref 21–57)
TIBC: 211 ug/dL — ABNORMAL LOW (ref 236–444)
UIBC: 165 ug/dL

## 2017-11-18 LAB — FERRITIN: Ferritin: 68 ng/mL (ref 9–269)

## 2017-11-18 LAB — VITAMIN B12: Vitamin B-12: 425 pg/mL (ref 180–914)

## 2017-11-18 MED ORDER — FERROUS SULFATE 325 (65 FE) MG PO TBEC: 325.0000 mg | DELAYED_RELEASE_TABLET | Freq: Every day | ORAL | 3 refills | Status: DC

## 2017-11-18 NOTE — Progress Notes (Signed)
Hematology and Oncology Follow Up Visit  Nicole Keith [id number] 08-07-1941 77 y.o. 11/18/2017 9:00 AM Nicole Keith, [name], Nicole Lies, MD   Principle Diagnosis: 77 year old woman with smoldering multiple myeloma, IgG lambda subtype diagnosed in April 2016. She presented with an M spike of 3.2 g/dL and IgG level of 4685. She has 29% plasma cell infiltration in the bone marrow without end organ damage.   Current therapy: Active surveillance and consideration to start treatment.  Interim History:  Nicole Keith is here for a follow-up.  Since her last visit, she developed a flare of her colitis and developed episodic diarrhea.  She took Imodium which helped her symptoms at this time.  Her diarrhea has subsided.  She denies any currently or complaints including excessive fatigue, tiredness or neuropathy.  Her appetite and performance status remain excellent.  She was diagnosed with carpal tunnel syndrome but still manageable without intervention.  She denies any bone pain or pathological fractures.   She does not report any  blurry vision, double vision, syncope or seizures. She does not report any fevers, chills, sweats, weight loss or appetite changes. She does not report any chest pain, palpitation, orthopnea or leg edema. She does not report any cough, wheezing, hemoptysis or shortness of breath. She does not report any nausea, vomiting, abdominal pain, satiety.  She does not report any frequency, urgency, hesitancy. She does not report any lymphadenopathy or petechiae.  She denies any skin rashes or lesions.  She denies any anxiety or depression.  Remaining review of systems is negative.  Medications: I have reviewed the patient's current medications.  Current Outpatient Medications  Medication Sig Dispense Refill  . calcium acetate (PHOSLO) 667 MG capsule Take by mouth 3 (three) times daily with meals.    . cholecalciferol (VITAMIN D) 1000 units tablet Take 1,000 Units by mouth daily.    .  famotidine (PEPCID) 20 MG tablet Take 20 mg by mouth daily.    . ferrous sulfate 325 (65 FE) MG EC tablet Take 1 tablet (325 mg total) by mouth daily with breakfast. 90 tablet 3  . ibuprofen (ADVIL,MOTRIN) 200 MG tablet Take 400 mg 3 (three) times daily by mouth.     . Probiotic Product (ALIGN PO) Take by mouth.     No current facility-administered medications for this visit.      Allergies:  Allergies  Allergen Reactions  . Betadine [Povidone Iodine] Rash  . Flagyl [Metronidazole] Diarrhea  . Fosamax [Alendronate Sodium] Other (See Comments)    GI distress  . Bactrim [Sulfamethoxazole-Trimethoprim]     nausea  . Doxepin Hcl     Irritable, hallucinations  . Hydroxyzine Hcl     hallucinations  . Valium [Diazepam] Other (See Comments)  . Actonel [Risedronate Sodium] Other (See Comments)    GI distress  . Boniva [Ibandronic Acid] Other (See Comments)    GI distress  . Doxycycline Rash  . Penicillins Rash    Ancef was OK.    Past Medical History, Surgical history, Social history, and Family History were reviewed and updated.   Physical Exam: Blood pressure (!) 147/66, pulse 73, temperature 98.8 F (37.1 C), temperature source Oral, resp. rate 16, height 5' 6.5" (1.689 m), weight 140 lb 1.6 oz (63.5 kg), SpO2 97 %.   ECOG: 0 General appearance: Well-appearing woman without distress. Head: Atraumatic without abnormalities. Eyes: No scleral icterus. Lymph nodes: Cervical, supraclavicular, and axillary nodes normal. Heart:regular rate and rhythm, S1, S2 normal, no murmur, click, rub or gallop Lung:  Clear to auscultation without any rhonchi, wheezes or dullness to percussion. Abdomin: Soft, nontender without any rebound or guarding. Musculoskeletal: No joint deformity or effusion. Skin: No petechia or ecchymosis. Neurological: No motor, sensory deficits.  Lab Results: Lab Results  Component Value Date   WBC 7.9 11/11/2017   HGB 10.3 (L) 11/11/2017   HCT 32.8 (L)  11/11/2017   MCV 97.3 11/11/2017   PLT 381 11/11/2017     Chemistry      Component Value Date/Time   NA 135 (L) 11/11/2017 0949   NA 135 (L) 06/11/2017 0753   K 3.9 11/11/2017 0949   K 4.0 06/11/2017 0753   CL 104 11/11/2017 0949   CO2 25 11/11/2017 0949   CO2 26 06/11/2017 0753   BUN 12 11/11/2017 0949   BUN 12.8 06/11/2017 0753   CREATININE 0.87 11/11/2017 0949   CREATININE 0.8 06/11/2017 0753      Component Value Date/Time   CALCIUM 8.9 11/11/2017 0949   CALCIUM 8.8 06/11/2017 0753   ALKPHOS 64 11/11/2017 0949   ALKPHOS 72 06/11/2017 0753   AST 21 11/11/2017 0949   AST 21 06/11/2017 0753   ALT 18 11/11/2017 0949   ALT 13 06/11/2017 0753   BILITOT 0.4 11/11/2017 0949   BILITOT 0.35 06/11/2017 0753     Results for Nicole Keith (MRN [id number]) as of 11/18/2017 09:03  Ref. Range 11/18/2016 09:47 06/11/2017 07:53 11/11/2017 09:48  M Protein SerPl Elph-Mcnc Latest Ref Range: Not Observed g/dL 4.1 (H) 4.7 (H) 4.7 (H)      Impression and Plan:  77 year old woman with the following issues:  1.  Smoldering multiple myeloma, IgG subtype diagnosed in April 2016.  She is currently on active surveillance with consideration to start treatment she developed symptoms.  Protein studies obtained on November 11, 2017 were personally reviewed and did not show any dramatic changes compared to previous labs.  Her M spike remains at 4.7 g/dL with an elevated IgG level.  She does not have any signs or symptoms of endorgan damage except for mild anemia.  Risks and benefits of starting therapy was reviewed again along with the natural course of this disease.  She would like to defer anti-myeloma therapy as long as possible.  She remains asymptomatic at this time and will continue to follow her closely.  We will repeat protein studies in 3 months.  2.  Anemia: We will obtain iron studies as well as B12 levels to ensure that her anemia is not related to other findings.  Iron supplements will be  prescribed to the patient accordingly.  Her RDW is elevated which could indicate iron deficiency.  3. Osteoporosis: Likely unrelated to plasma cell disorder.  No pathological fractures or bone pain.  She remains on calcium and vitamin D supplements.  4. Follow-up: Will be July 2019.  15  minutes was spent with the patient face-to-face today.  More than 50% of time was dedicated to patient counseling, education and coordination of her care.   Zola Button, MD 4/10/20199:00 AM

## 2017-11-18 NOTE — Telephone Encounter (Signed)
Printed avs and calender of upcoming appointments, and labs today. Per 4/10 los

## 2017-12-22 ENCOUNTER — Ambulatory Visit: Payer: Medicare Other | Admitting: Neurology

## 2017-12-22 ENCOUNTER — Encounter: Payer: Self-pay | Admitting: Neurology

## 2017-12-22 VITALS — BP 126/64 | HR 83 | Ht 66.5 in | Wt 137.0 lb

## 2017-12-22 DIAGNOSIS — G5601 Carpal tunnel syndrome, right upper limb: Secondary | ICD-10-CM | POA: Diagnosis not present

## 2017-12-22 NOTE — Progress Notes (Signed)
NEUROLOGY FOLLOW UP OFFICE NOTE  Nicole Keith [id number]  HISTORY OF PRESENT ILLNESS: Nicole Keith is a 77 year old right-handed female with MGUS who follows up for carpal tunnel syndrome.    UPDATE: NCV-EMG from 07/28/17 demonstrated right median neuropathy at the wrist consistent with moderate carpal tunnel syndrome, as well as mild ulnar neuropathy with slowing across the elbow.  She was advised to wear wrist splints.  The left hand doesn't really bother her.  The wrist splint helps with the right hand, but symptoms have gotten worse.  She reports increased numbness in the first 3 digits with associated pain.  She has increased trouble with grip.     HISTORY: She was diagnosed with MGUS about 2 years ago.  She has been monitored by Heme/Onc and has thus far been negative for multiple myeloma.  About 8 months ago, she began experiencing an "internal tremor" in her chest.  She also reports sensation of numbness and tingling down both arms, particularly involving the right hand.  It involves all fingers. She only really notices it at night in bed when laying on her right side or on her back.  She denies neck pain but has history of bulging discs at C6 and C7.  She denies radicular pain down the arms or weakness.  She denies symptoms in the legs and feet.  She also has a pruritic rash on her arms and legs, treated by topic [facility] but her dermatologist.  She plays rhythm guitar.  PAST MEDICAL HISTORY: Past Medical History:  Diagnosis Date  . Allergic rhinitis   . Arthritis   . Colitis, collagenous   . Diverticular disease    severe sig tics/fixation 2012  . Family hx of colon cancer   . Fracture of femoral neck, right ([place]) 01/09/2013  . GERD (gastroesophageal reflux disease)    exacerbation of reflux-like symptoms 04/2011  . Hiatal hernia   . Hyperlipidemia   . Osteoarthritis of right hip 01/10/2013    MEDICATIONS: Current Outpatient Medications on File Prior to Visit  Medication  Sig Dispense Refill  . acetaminophen (TYLENOL) 500 MG tablet Take 500 mg by mouth every 6 (six) hours as needed.    . diphenhydrAMINE (BENADRYL) 25 mg capsule Take 25 mg by mouth at bedtime as needed.    . loratadine (CLARITIN) 10 MG tablet Take 10 mg by mouth daily.    . calcium acetate (PHOSLO) 667 MG capsule Take by mouth 3 (three) times daily with meals.    . cholecalciferol (VITAMIN D) 1000 units tablet Take 1,000 Units by mouth daily.    . famotidine (PEPCID) 20 MG tablet Take 20 mg by mouth daily.    . ferrous sulfate 325 (65 FE) MG EC tablet Take 1 tablet (325 mg total) by mouth daily with breakfast. 90 tablet 3  . ibuprofen (ADVIL,MOTRIN) 200 MG tablet Take 400 mg 3 (three) times daily by mouth.     . Probiotic Product (ALIGN PO) Take by mouth.     No current facility-administered medications on file prior to visit.     ALLERGIES: Allergies  Allergen Reactions  . Betadine [Povidone Iodine] Rash  . Flagyl [Metronidazole] Diarrhea  . Fosamax [Alendronate Sodium] Other (See Comments)    GI distress  . Bactrim [Sulfamethoxazole-Trimethoprim]     nausea  . Doxepin Hcl     Irritable, hallucinations  . Hydroxyzine Hcl     hallucinations  . Valium [Diazepam] Other (See Comments)  . Actonel [Risedronate Sodium] Other (  See Comments)    GI distress  . Boniva [Ibandronic Acid] Other (See Comments)    GI distress  . Doxycycline Rash  . Penicillins Rash    Ancef was OK.    FAMILY HISTORY: Family History  Problem Relation Age of Onset  . Congestive Heart Failure Mother   . Arthritis-Osteo Mother   . Colon cancer Mother   . Heart attack Father   . Rheum arthritis Father   . Colon cancer Maternal Aunt   . Colon cancer Maternal Aunt   . Hypothyroidism Sister     SOCIAL HISTORY: Social History   Socioeconomic History  . Marital status: Married    Spouse name: Not on file  . Number of children: 0  . Years of education: Not on file  . Highest education level: Some  college, no degree  Occupational History  . Occupation: retired  Scientific laboratory technician  . Financial resource strain: Not on file  . Food insecurity:    Worry: Not on file    Inability: Not on file  . Transportation needs:    Medical: Not on file    Non-medical: Not on file  Tobacco Use  . Smoking status: Former Smoker    Last attempt to quit: 01/10/1972    Years since quitting: 45.9  . Smokeless tobacco: Never Used  . Tobacco comment: quit 1973  Substance and Sexual Activity  . Alcohol use: No  . Drug use: No  . Sexual activity: Not on file  Lifestyle  . Physical activity:    Days per week: Not on file    Minutes per session: Not on file  . Stress: Not on file  Relationships  . Social connections:    Talks on phone: Not on file    Gets together: Not on file    Attends religious service: Not on file    Active member of club or organization: Not on file    Attends meetings of clubs or organizations: Not on file    Relationship status: Not on file  . Intimate partner violence:    Fear of current or ex partner: Not on file    Emotionally abused: Not on file    Physically abused: Not on file    Forced sexual activity: Not on file  Other Topics Concern  . Not on file  Social History Narrative   Married, no children. Drinks 2 cups decaf coffee a day. Walks daily. Is active, plays golf, she and her husband go hiking frequently. 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    REVIEW OF SYSTEMS: Constitutional: No fevers, chills, or sweats, no generalized fatigue, change in appetite Eyes: No visual changes, double vision, eye pain Ear, nose and throat: No hearing loss, ear pain, nasal congestion, sore throat Cardiovascular: No chest pain, palpitations Respiratory:  No shortness of breath at rest or with exertion, wheezes GastrointestinaI: No nausea, vomiting, diarrhea, abdominal pain, fecal incontinence Genitourinary:  No dysuria, urinary retention or  frequency Musculoskeletal:  No neck pain, back pain Integumentary: No rash, pruritus, skin lesions Neurological: as above Psychiatric: No depression, insomnia, anxiety Endocrine: No palpitations, fatigue, diaphoresis, mood swings, change in appetite, change in weight, increased thirst Hematologic/Lymphatic:  No purpura, petechiae. Allergic/Immunologic: no itchy/runny eyes, nasal congestion, recent allergic reactions, rashes  PHYSICAL EXAM: Vitals:   12/22/17 1057  BP: 126/64  Pulse: 83  SpO2: 97%   General: No acute distress.  Patient appears well-groomed.  normal body habitus. Head:  Normocephalic/atraumatic Eyes:  Fundi  examined but not visualized Neck: supple, no paraspinal tenderness, full range of motion Heart:  Regular rate and rhythm Lungs:  Clear to auscultation bilaterally Back: No paraspinal tenderness Neurological Exam: alert and oriented to person, place, and time. Attention span and concentration intact, recent and remote memory intact, fund of knowledge intact.  Speech fluent and not dysarthric, language intact.  CN II-XII intact. Bulk and tone normal, muscle strength 4+/5 right APB.  Otherwise, 5/5 throughout.  Sensation to pinprick and vibration intact.  Deep tendon reflexes 2+ throughout, toes downgoing.  Finger to nose testing intact.  Gait normal.  Positive Tinel's sign.  IMPRESSION: Right carpal tunnel syndrome  PLAN: I will refer her to a hand specialist.  At this point, she is not interested in surgery but is open to steroid injections.  She will hear from the specialist for his opinion.  In the meantime, she will continue with the wrist splint and applying heat and cold.  15 minutes spent face to face with patient, over 50% spent discussing management.  Metta Clines, DO  CC: Dr. Orland Mustard

## 2017-12-22 NOTE — Patient Instructions (Signed)
We will send you to a hand specialist at [facility], Dr. Amedeo Plenty

## 2017-12-31 ENCOUNTER — Telehealth: Payer: Self-pay | Admitting: Neurology

## 2017-12-31 NOTE — Telephone Encounter (Signed)
Patient wanted you to have both her contact #'s [phone] Home and cell [phone] for the Referral. Thanks

## 2018-01-06 NOTE — Telephone Encounter (Signed)
Pt is scheduled for 01/25/18 at 1:45

## 2018-02-15 ENCOUNTER — Inpatient Hospital Stay: Payer: Medicare Other | Attending: Oncology

## 2018-02-15 DIAGNOSIS — D649 Anemia, unspecified: Secondary | ICD-10-CM | POA: Diagnosis not present

## 2018-02-15 DIAGNOSIS — M810 Age-related osteoporosis without current pathological fracture: Secondary | ICD-10-CM | POA: Insufficient documentation

## 2018-02-15 DIAGNOSIS — D472 Monoclonal gammopathy: Secondary | ICD-10-CM | POA: Insufficient documentation

## 2018-02-15 LAB — CBC WITH DIFFERENTIAL (CANCER CENTER ONLY)
Basophils Absolute: 0.1 10*3/uL (ref 0.0–0.1)
Basophils Relative: 1 %
Eosinophils Absolute: 0.1 10*3/uL (ref 0.0–0.5)
Eosinophils Relative: 2 %
HCT: 31 % — ABNORMAL LOW (ref 34.8–46.6)
Hemoglobin: 10.4 g/dL — ABNORMAL LOW (ref 11.6–15.9)
Lymphocytes Relative: 30 %
Lymphs Abs: 2 10*3/uL (ref 0.9–3.3)
MCH: 31.4 pg (ref 25.1–34.0)
MCHC: 33.6 g/dL (ref 31.5–36.0)
MCV: 93.3 fL (ref 79.5–101.0)
Monocytes Absolute: 0.6 10*3/uL (ref 0.1–0.9)
Monocytes Relative: 9 %
Neutro Abs: 4 10*3/uL (ref 1.5–6.5)
Neutrophils Relative %: 58 %
Platelet Count: 276 10*3/uL (ref 145–400)
RBC: 3.32 MIL/uL — ABNORMAL LOW (ref 3.70–5.45)
RDW: 17.9 % — ABNORMAL HIGH (ref 11.2–14.5)
WBC Count: 6.8 10*3/uL (ref 3.9–10.3)

## 2018-02-15 LAB — IRON AND TIBC
Iron: 68 ug/dL (ref 41–142)
Saturation Ratios: 28 % (ref 21–57)
TIBC: 243 ug/dL (ref 236–444)
UIBC: 174 ug/dL

## 2018-02-15 LAB — CMP (CANCER CENTER ONLY)
ALT: 16 U/L (ref 0–44)
AST: 19 U/L (ref 15–41)
Albumin: 2.8 g/dL — ABNORMAL LOW (ref 3.5–5.0)
Alkaline Phosphatase: 68 U/L (ref 38–126)
Anion gap: 1 — ABNORMAL LOW (ref 5–15)
BUN: 10 mg/dL (ref 8–23)
CO2: 31 mmol/L (ref 22–32)
Calcium: 9 mg/dL (ref 8.9–10.3)
Chloride: 101 mmol/L (ref 98–111)
Creatinine: 0.89 mg/dL (ref 0.44–1.00)
GFR, Est AFR Am: >60 mL/min (ref >60)
GFR, Estimated: >60 mL/min (ref >60)
Glucose, Bld: 110 mg/dL — ABNORMAL HIGH (ref 70–99)
Potassium: 4 mmol/L (ref 3.5–5.1)
Sodium: 133 mmol/L — ABNORMAL LOW (ref 135–145)
Total Bilirubin: 0.3 mg/dL (ref 0.3–1.2)
Total Protein: 12.2 g/dL — ABNORMAL HIGH (ref 6.5–8.1)

## 2018-02-15 LAB — FERRITIN: Ferritin: 85 ng/mL (ref 11–307)

## 2018-02-16 LAB — MULTIPLE MYELOMA PANEL, SERUM
Albumin SerPl Elph-Mcnc: 3.6 g/dL (ref 2.9–4.4)
Albumin/Glob SerPl: 0.5 — ABNORMAL LOW (ref 0.7–1.7)
Alpha 1: 0.3 g/dL (ref 0.0–0.4)
Alpha2 Glob SerPl Elph-Mcnc: 0.7 g/dL (ref 0.4–1.0)
B-Globulin SerPl Elph-Mcnc: 1 g/dL (ref 0.7–1.3)
Gamma Glob SerPl Elph-Mcnc: 5.5 g/dL — ABNORMAL HIGH (ref 0.4–1.8)
Globulin, Total: 7.5 g/dL — ABNORMAL HIGH (ref 2.2–3.9)
IgA: 31 mg/dL — ABNORMAL LOW (ref 64–422)
IgG (Immunoglobin G), Serum: 7247 mg/dL — ABNORMAL HIGH (ref 700–1600)
IgM (Immunoglobulin M), Srm: 30 mg/dL (ref 26–217)
M Protein SerPl Elph-Mcnc: 5.3 g/dL — ABNORMAL HIGH
Total Protein ELP: 11.1 g/dL — ABNORMAL HIGH (ref 6.0–8.5)

## 2018-02-16 LAB — KAPPA/LAMBDA LIGHT CHAINS
Kappa free light chain: 8.5 mg/L (ref 3.3–19.4)
Kappa, lambda light chain ratio: 0.03 — ABNORMAL LOW (ref 0.26–1.65)
Lambda free light chains: 277.9 mg/L — ABNORMAL HIGH (ref 5.7–26.3)

## 2018-02-17 ENCOUNTER — Inpatient Hospital Stay (HOSPITAL_BASED_OUTPATIENT_CLINIC_OR_DEPARTMENT_OTHER): Payer: Medicare Other | Admitting: Oncology

## 2018-02-17 ENCOUNTER — Telehealth: Payer: Self-pay | Admitting: Oncology

## 2018-02-17 ENCOUNTER — Encounter: Payer: Self-pay | Admitting: Oncology

## 2018-02-17 VITALS — BP 144/71 | HR 78 | Temp 98.7°F | Resp 18 | Ht 66.5 in | Wt 138.3 lb

## 2018-02-17 DIAGNOSIS — D472 Monoclonal gammopathy: Secondary | ICD-10-CM

## 2018-02-17 DIAGNOSIS — D649 Anemia, unspecified: Secondary | ICD-10-CM | POA: Diagnosis not present

## 2018-02-17 DIAGNOSIS — M810 Age-related osteoporosis without current pathological fracture: Secondary | ICD-10-CM | POA: Diagnosis not present

## 2018-02-17 NOTE — Progress Notes (Signed)
Hematology and Oncology Follow Up Visit  Nicole Keith [id number] 07-16-1941 77 y.o. 02/17/2018 8:29 AM [name], [name], [name], MD   Principle Diagnosis: 77 year old woman with IgG lambda plasma cell disorder diagnosed in April 2016. She presented with an M spike of 3.2 g/dL and IgG level of 4685. She has 29% plasma cell infiltration which represents smoldering multiple myeloma.   Current therapy: Active surveillance   Interim History:  Nicole Keith is today for a follow-up visit.  Since last visit, she reports no major changes in her health.  She continues to be asymptomatic without any excessive fatigue or tiredness.  She does report mild rash that she follows with dermatology regarding this issue.  She denies any bone pain or pathological fractures.  She denies any excessive fatigue or tiredness.  She denies any recurrent infections or neurological deficits.  She does not report any  blurry vision, double vision, syncope or seizures.  She denies any alteration in mental status or confusion.  She does not report any fevers, chills, sweats, or excessive fatigue. She does not report any chest pain, palpitation, orthopnea or leg edema. She does not report any cough, wheezing, hemoptysis or shortness of breath. She does not report any nausea, vomiting, abdominal pain, satiety.  She denies any constipation or diarrhea.  She does not report any frequency, urgency, hesitancy. She does not report any lymphadenopathy or petechiae.  She denies any skin rashes or lesions.  She denies any changes in her mood.  She denies any bleeding or clotting tendencies.  Remaining review of systems is negative.  Medications: I have reviewed the patient's current medications.  Current Outpatient Medications  Medication Sig Dispense Refill  . acetaminophen (TYLENOL) 500 MG tablet Take 500 mg by mouth every 6 (six) hours as needed.    . calcium acetate (PHOSLO) 667 MG capsule Take by mouth 3 (three) times daily  with meals.    . cholecalciferol (VITAMIN D) 1000 units tablet Take 1,000 Units by mouth daily.    . diphenhydrAMINE (BENADRYL) 25 mg capsule Take 25 mg by mouth at bedtime as needed.    . famotidine (PEPCID) 20 MG tablet Take 20 mg by mouth daily.    . ferrous sulfate 325 (65 FE) MG EC tablet Take 1 tablet (325 mg total) by mouth daily with breakfast. 90 tablet 3  . ibuprofen (ADVIL,MOTRIN) 200 MG tablet Take 400 mg 3 (three) times daily by mouth.     . loratadine (CLARITIN) 10 MG tablet Take 10 mg by mouth daily.    . Probiotic Product (ALIGN PO) Take by mouth.     No current facility-administered medications for this visit.      Allergies:  Allergies  Allergen Reactions  . Betadine [Povidone Iodine] Rash  . Flagyl [Metronidazole] Diarrhea  . Fosamax [Alendronate Sodium] Other (See Comments)    GI distress  . Bactrim [Sulfamethoxazole-Trimethoprim]     nausea  . Doxepin Hcl     Irritable, hallucinations  . Hydroxyzine Hcl     hallucinations  . Valium [Diazepam] Other (See Comments)  . Actonel [Risedronate Sodium] Other (See Comments)    GI distress  . Boniva [Ibandronic Acid] Other (See Comments)    GI distress  . Doxycycline Rash  . Penicillins Rash    Ancef was OK.    Past Medical History, Surgical history, Social history, and Family History were reviewed and updated.   Physical Exam:  Blood pressure (!) 144/71, pulse 78, temperature 98.7 F (37.1 C), temperature  source Oral, resp. rate 18, height 5' 6.5" (1.689 m), weight 138 lb 4.8 oz (62.7 kg), SpO2 98 %.   ECOG: 0 General appearance: Alert, awake woman without distress. Head: Normocephalic without abnormalities. Eyes: Pupils are equal and round reactive to light. Lymph nodes: No lymphadenopathy noted in the cervical, supraclavicular, or axillary nodes  Heart:regular rate and rhythm, without murmurs or gallops.  No leg edema. Lung: Clear all lung fields without any rhonchi, wheezes or dullness to  percussion. Abdomin: Soft, nontender, nondistended with good bowel sounds. Musculoskeletal: No clubbing or cyanosis. Skin: No ecchymosis or petechiae. Neurological: No deficits noted and her exam.  Intact motor sensory and deep tendon reflexes.  Lab Results: Lab Results  Component Value Date   WBC 6.8 02/15/2018   HGB 10.4 (L) 02/15/2018   HCT 31.0 (L) 02/15/2018   MCV 93.3 02/15/2018   PLT 276 02/15/2018     Chemistry      Component Value Date/Time   NA 133 (L) 02/15/2018 1107   NA 135 (L) 06/11/2017 0753   K 4.0 02/15/2018 1107   K 4.0 06/11/2017 0753   CL 101 02/15/2018 1107   CO2 31 02/15/2018 1107   CO2 26 06/11/2017 0753   BUN 10 02/15/2018 1107   BUN 12.8 06/11/2017 0753   CREATININE 0.89 02/15/2018 1107   CREATININE 0.8 06/11/2017 0753      Component Value Date/Time   CALCIUM 9.0 02/15/2018 1107   CALCIUM 8.8 06/11/2017 0753   ALKPHOS 68 02/15/2018 1107   ALKPHOS 72 06/11/2017 0753   AST 19 02/15/2018 1107   AST 21 06/11/2017 0753   ALT 16 02/15/2018 1107   ALT 13 06/11/2017 0753   BILITOT 0.3 02/15/2018 1107   BILITOT 0.35 06/11/2017 0753      Results for Nicole, Keith (MRN [id number]) as of 02/17/2018 07:51  Ref. Range 06/11/2017 07:53 11/11/2017 09:48 02/15/2018 11:08  M Protein SerPl Elph-Mcnc Latest Ref Range: Not Observed g/dL 4.7 (H) 4.7 (H) 5.3 (H)    Results for Nicole, Keith (MRN [id number]) as of 02/17/2018 07:51  Ref. Range 06/11/2017 07:53 11/11/2017 09:48 02/15/2018 11:08  IgG (Immunoglobin G), Serum Latest Ref Range: 700 - 1,600 mg/dL 6,342 (H) 7,575 (H) 7,247 (H)    Impression and Plan:  77 year old woman with:  1.  IgG lambda smoldering myeloma diagnosed in April 2016.  She has been on active surveillance since the time of diagnosis and she remains asymptomatic.   Protein studies obtained on 02/15/2018 were personally reviewed and they are not dramatically change from previous testing.  Her IgG level has decreased to 7200 and her M spike  around 5 g/dL.  The natural course of this disease was reviewed again including treatment options.  Her choices include continued active surveillance versus proceeding with anti-myeloma therapy.  These options would include Velcade-based chemotherapy, Revlimid based therapy along other regimens.  After discussion today, she elected to defer anticancer treatment until she is symptomatic.  I have recommended close observation and surveillance at this time.  2.  Anemia: Iron studies obtained in July 2019 were reviewed and all within normal range.  Her anemia is likely related to plasma cell disorder and her hemoglobin remains stable.  3. Osteoporosis: Currently on calcium and vitamin D.  Consideration for bone directed therapy form of Zometa will be considered.  4. Follow-up: Will be in December 2019.  15  minutes was spent with the patient face-to-face today.  More than 50% of time was dedicated to  patient counseling, education and answering questions regarding future plan of care.  Zola Button, MD 7/10/20198:29 AM

## 2018-02-17 NOTE — Telephone Encounter (Signed)
Scheduled appt per 7/10 los - gave patient AVS and calender per los.  

## 2018-07-05 ENCOUNTER — Other Ambulatory Visit: Payer: Self-pay | Admitting: Oncology

## 2018-07-05 ENCOUNTER — Telehealth: Payer: Self-pay

## 2018-07-05 DIAGNOSIS — D472 Monoclonal gammopathy: Secondary | ICD-10-CM

## 2018-07-05 NOTE — Telephone Encounter (Signed)
Contacted patient for follow up and left message for bone survey to be scheduled prior to MD follow up appointment on 12/10, and to call back with any questions or concerns.

## 2018-07-05 NOTE — Telephone Encounter (Signed)
-----   Message from Wyatt Portela, MD sent at 07/05/2018  9:20 AM EST ----- Regarding: RE: follow up Yes. Will add xray to her lab appointment.  ----- Message ----- From: Scot Dock, RN Sent: 07/05/2018   9:18 AM EST To: Wyatt Portela, MD Subject: follow up                                      Patient stated that she has been having increasing bone pain along her rib cage and her back. She wears a brace while up during the day and takes Ibuprofen. She has lab appt 12/3 and F/u 12/10 and is wondering if she needs a scan at this time as well. Please advise.

## 2018-07-06 ENCOUNTER — Telehealth: Payer: Self-pay

## 2018-07-06 NOTE — Telephone Encounter (Signed)
Return call to patient to state that Dr. Alen Blew will discuss pain management interventions during the next scheduled appointment. Patient stated that she is taking Ibuprofen 3-4 times a day and takes frequent breaks from activity to rest for pain management. She was appreciative of the return call and had no other questions or concerns at this time.

## 2018-07-12 ENCOUNTER — Ambulatory Visit (HOSPITAL_COMMUNITY)
Admission: RE | Admit: 2018-07-12 | Discharge: 2018-07-12 | Disposition: A | Discharge status: Home / Self Care | Payer: Medicare Other | Source: Ambulatory Visit | Attending: Oncology | Admitting: Oncology

## 2018-07-12 ENCOUNTER — Inpatient Hospital Stay: Payer: Medicare Other | Attending: Oncology

## 2018-07-12 ENCOUNTER — Telehealth: Payer: Self-pay

## 2018-07-12 ENCOUNTER — Telehealth: Payer: Self-pay | Admitting: *Deleted

## 2018-07-12 DIAGNOSIS — C9000 Multiple myeloma not having achieved remission: Secondary | ICD-10-CM | POA: Insufficient documentation

## 2018-07-12 DIAGNOSIS — D630 Anemia in neoplastic disease: Secondary | ICD-10-CM | POA: Insufficient documentation

## 2018-07-12 DIAGNOSIS — Z5112 Encounter for antineoplastic immunotherapy: Secondary | ICD-10-CM | POA: Insufficient documentation

## 2018-07-12 DIAGNOSIS — M549 Dorsalgia, unspecified: Secondary | ICD-10-CM | POA: Insufficient documentation

## 2018-07-12 DIAGNOSIS — R0789 Other chest pain: Secondary | ICD-10-CM | POA: Diagnosis not present

## 2018-07-12 DIAGNOSIS — D472 Monoclonal gammopathy: Secondary | ICD-10-CM

## 2018-07-12 LAB — CBC WITH DIFFERENTIAL (CANCER CENTER ONLY)
Abs Immature Granulocytes: 0.05 10*3/uL (ref 0.00–0.07)
Basophils Absolute: 0 10*3/uL (ref 0.0–0.1)
Basophils Relative: 1 %
Eosinophils Absolute: 0.1 10*3/uL (ref 0.0–0.5)
Eosinophils Relative: 1 %
HCT: 26.9 % — ABNORMAL LOW (ref 36.0–46.0)
Hemoglobin: 8.7 g/dL — ABNORMAL LOW (ref 12.0–15.0)
Immature Granulocytes: 1 %
Lymphocytes Relative: 27 %
Lymphs Abs: 1.7 10*3/uL (ref 0.7–4.0)
MCH: 30.4 pg (ref 26.0–34.0)
MCHC: 32.3 g/dL (ref 30.0–36.0)
MCV: 94.1 fL (ref 80.0–100.0)
Monocytes Absolute: 0.7 10*3/uL (ref 0.1–1.0)
Monocytes Relative: 10 %
Neutro Abs: 3.9 10*3/uL (ref 1.7–7.7)
Neutrophils Relative %: 60 %
Platelet Count: 269 10*3/uL (ref 150–400)
RBC: 2.86 MIL/uL — ABNORMAL LOW (ref 3.87–5.11)
RDW: 17.1 % — ABNORMAL HIGH (ref 11.5–15.5)
WBC Count: 6.5 10*3/uL (ref 4.0–10.5)
nRBC: 0 % (ref 0.0–0.2)

## 2018-07-12 LAB — CMP (CANCER CENTER ONLY)
ALT: 33 U/L (ref 0–44)
AST: 29 U/L (ref 15–41)
Albumin: 2.7 g/dL — ABNORMAL LOW (ref 3.5–5.0)
Alkaline Phosphatase: 60 U/L (ref 38–126)
Anion gap: 5 (ref 5–15)
BUN: 14 mg/dL (ref 8–23)
CO2: 27 mmol/L (ref 22–32)
Calcium: 9 mg/dL (ref 8.9–10.3)
Chloride: 100 mmol/L (ref 98–111)
Creatinine: 0.89 mg/dL (ref 0.44–1.00)
GFR, Est AFR Am: >60 mL/min (ref >60)
GFR, Estimated: >60 mL/min (ref >60)
Glucose, Bld: 94 mg/dL (ref 70–99)
Potassium: 4.1 mmol/L (ref 3.5–5.1)
Sodium: 132 mmol/L — ABNORMAL LOW (ref 135–145)
Total Bilirubin: 0.5 mg/dL (ref 0.3–1.2)
Total Protein: 12.9 g/dL — ABNORMAL HIGH (ref 6.5–8.1)

## 2018-07-12 NOTE — Telephone Encounter (Signed)
Patient requested lab be changed to labs today. [facility] RN.) spoke with patient at sch. Desk. and patient will be going to registration to sign in for lab. Per 12/2 walk in

## 2018-07-12 NOTE — Telephone Encounter (Signed)
Called to speak with pt in scheduling. Pt states she is having increased rib pain over the last 4 weeks and asking for a refill on her pain meds. Original pain meds were prescribed by her hand surgeon. Informed pt that Dr. Alen Blew would not refill these meds. She has an appt to see him next Tuesday scheduled appt.. Pt states that she will call the hand surgeon for refill. Pt states that her rib pain makes it difficult to get in and out of bed but that she has managed to figure out a way to do that. Pt had a bone scan today and will have labs done today in preparation for next week's appt with Dr. Alen Blew.   Advised pt that if her pain gets worse before next Tuesday to call here and perhaps be seen in [facility] or go to ED. Pt voiced understanding.

## 2018-07-13 ENCOUNTER — Inpatient Hospital Stay: Payer: Medicare Other

## 2018-07-13 LAB — KAPPA/LAMBDA LIGHT CHAINS
Kappa free light chain: 6.9 mg/L (ref 3.3–19.4)
Kappa, lambda light chain ratio: 0.02 — ABNORMAL LOW (ref 0.26–1.65)
Lambda free light chains: 427.9 mg/L — ABNORMAL HIGH (ref 5.7–26.3)

## 2018-07-14 LAB — MULTIPLE MYELOMA PANEL, SERUM
Albumin SerPl Elph-Mcnc: 3.9 g/dL (ref 2.9–4.4)
Albumin/Glob SerPl: 0.5 — ABNORMAL LOW (ref 0.7–1.7)
Alpha 1: 0.4 g/dL (ref 0.0–0.4)
Alpha2 Glob SerPl Elph-Mcnc: 0.9 g/dL (ref 0.4–1.0)
B-Globulin SerPl Elph-Mcnc: 1.1 g/dL (ref 0.7–1.3)
Gamma Glob SerPl Elph-Mcnc: 6.1 g/dL — ABNORMAL HIGH (ref 0.4–1.8)
Globulin, Total: 8.6 g/dL — ABNORMAL HIGH (ref 2.2–3.9)
IgA: 25 mg/dL — ABNORMAL LOW (ref 64–422)
IgG (Immunoglobin G), Serum: 8977 mg/dL — ABNORMAL HIGH (ref 700–1600)
IgM (Immunoglobulin M), Srm: 26 mg/dL (ref 26–217)
M Protein SerPl Elph-Mcnc: 5.9 g/dL — ABNORMAL HIGH
Total Protein ELP: 12.5 g/dL — ABNORMAL HIGH (ref 6.0–8.5)

## 2018-07-20 ENCOUNTER — Telehealth: Payer: Self-pay | Admitting: Oncology

## 2018-07-20 ENCOUNTER — Other Ambulatory Visit: Payer: Self-pay

## 2018-07-20 ENCOUNTER — Inpatient Hospital Stay: Payer: Medicare Other | Admitting: Oncology

## 2018-07-20 ENCOUNTER — Other Ambulatory Visit: Payer: Self-pay | Admitting: Oncology

## 2018-07-20 VITALS — BP 158/68 | HR 81 | Temp 98.0°F | Resp 17 | Ht 66.0 in | Wt 145.4 lb

## 2018-07-20 DIAGNOSIS — M549 Dorsalgia, unspecified: Secondary | ICD-10-CM

## 2018-07-20 DIAGNOSIS — R0781 Pleurodynia: Secondary | ICD-10-CM | POA: Diagnosis not present

## 2018-07-20 DIAGNOSIS — C9000 Multiple myeloma not having achieved remission: Secondary | ICD-10-CM | POA: Insufficient documentation

## 2018-07-20 DIAGNOSIS — Z7189 Other specified counseling: Secondary | ICD-10-CM

## 2018-07-20 DIAGNOSIS — D630 Anemia in neoplastic disease: Secondary | ICD-10-CM

## 2018-07-20 DIAGNOSIS — D472 Monoclonal gammopathy: Secondary | ICD-10-CM

## 2018-07-20 DIAGNOSIS — Z5112 Encounter for antineoplastic immunotherapy: Secondary | ICD-10-CM | POA: Diagnosis not present

## 2018-07-20 MED ORDER — PROCHLORPERAZINE MALEATE 10 MG PO TABS: 10.0000 mg | ORAL_TABLET | Freq: Four times a day (QID) | ORAL | 0 refills | Status: DC | PRN

## 2018-07-20 MED ORDER — DEXAMETHASONE 4 MG PO TABS: ORAL_TABLET | ORAL | 3 refills | Status: DC

## 2018-07-20 MED ORDER — OXYCODONE HCL 5 MG PO TABS: 5.0000 mg | ORAL_TABLET | ORAL | 0 refills | Status: DC | PRN

## 2018-07-20 MED ORDER — ACYCLOVIR 400 MG PO TABS: 400.0000 mg | ORAL_TABLET | Freq: Two times a day (BID) | ORAL | 3 refills | Status: DC

## 2018-07-20 NOTE — Progress Notes (Signed)
START ON PATHWAY REGIMEN - Multiple Myeloma and Other Plasma Cell Dyscrasias     A cycle is every 21 days:     Bortezomib      Lenalidomide      Dexamethasone   **Always confirm dose/schedule in your pharmacy ordering system**  Patient Characteristics: Newly Diagnosed, Transplant Ineligible or Refused, Standard Risk R-ISS Staging: III Disease Classification: Newly Diagnosed Is Patient Eligible for Transplant<= Transplant Ineligible or Refused Risk Status: Standard Risk Intent of Therapy: Non-Curative / Palliative Intent, Discussed with Patient

## 2018-07-20 NOTE — Progress Notes (Addendum)
Hematology and Oncology Follow Up Visit  Nicole Keith [id number] [birth year] 77 y.o. 07/20/2018 10:18 AM Nicole Keith, [name], [name], MD   Principle Diagnosis: 77 year old woman with IgG lambda multiple myeloma initially presented with plasma cell disorder diagnosed in April 2016.  She had asymptomatic smoldering myeloma at that time but has evolved into symptomatic anemia indicating active myeloma.   Current therapy: Under consideration to start therapy.  Interim History:  Nicole Keith returns today for a repeat evaluation.  Since her last visit, she has few complaints predominantly back and rib cage pain.  Her pain has been lingering for the last few months and despite using hydrocodone has persisted.  Her pain is described as nagging dull pain without any neurological deficits.  She has had difficulty taking a deep breath but no chest pain or palpitation.  She does report some occasional pruritus although no documented skin rash.  She does report some fatigue and tiredness but no dyspnea on exertion.  She denies any recurrent infections or peripheral neuropathy.  She does not report any  blurry vision, double vision, syncope or seizures.  She denies any confusion or lethargy.  She does not report any fevers, chills, sweats, or excessive fatigue. She does not report any chest pain, palpitation, orthopnea or leg edema. She does not report any cough, wheezing, hemoptysis or dyspnea on exertion.  She does not report any nausea, vomiting, or distention.  She denies any change in bowel habits.  She does not report any frequency, urgency, hesitancy. She does not report any ecchymosis or petechiae.  Does report some occasional anxiety.  She denies any heat or cold intolerance.  Remaining review of systems is negative.  Medications: I have reviewed the patient's current medications.  Current Outpatient Medications  Medication Sig Dispense Refill  . diphenhydrAMINE (BENADRYL) 25 mg capsule Take 25 mg  by mouth at bedtime as needed.    . famotidine (PEPCID) 20 MG tablet Take 20 mg by mouth daily.    . ferrous sulfate 325 (65 FE) MG EC tablet Take 1 tablet (325 mg total) by mouth daily with breakfast. 90 tablet 3  . ibuprofen (ADVIL,MOTRIN) 200 MG tablet Take 400 mg 3 (three) times daily by mouth.     . loratadine (CLARITIN) 10 MG tablet Take 10 mg by mouth daily.    . Probiotic Product (ALIGN PO) Take by mouth.     No current facility-administered medications for this visit.      Allergies:  Allergies  Allergen Reactions  . Betadine [Povidone Iodine] Rash  . Flagyl [Metronidazole] Diarrhea  . Fosamax [Alendronate Sodium] Other (See Comments)    GI distress  . Bactrim [Sulfamethoxazole-Trimethoprim]     nausea  . Doxepin Hcl     Irritable, hallucinations  . Hydroxyzine Hcl     hallucinations  . Valium [Diazepam] Other (See Comments)  . Actonel [Risedronate Sodium] Other (See Comments)    GI distress  . Boniva [Ibandronic Acid] Other (See Comments)    GI distress  . Doxycycline Rash  . Penicillins Rash    Ancef was OK.    Past Medical History, Surgical history, Social history, and Family History were reviewed and updated.   Physical Exam:  Blood pressure (!) 158/68, pulse 81, temperature 98 F (36.7 C), temperature source Oral, resp. rate 17, height '5\' 6"'  (1.676 m), weight 145 lb 6.4 oz (66 kg), SpO2 96 %.    ECOG: 1     General appearance: Comfortable appearing without any discomfort Head:  Normocephalic without any trauma Oropharynx: Mucous membranes are moist and pink without any thrush or ulcers. Eyes: Pupils are equal and round reactive to light. Lymph nodes: No cervical, supraclavicular, inguinal or axillary lymphadenopathy.   Heart:regular rate and rhythm.  S1 and S2 without leg edema. Lung: Clear without any rhonchi or wheezes.  No dullness to percussion. Abdomin: Soft, nontender, nondistended with good bowel sounds.  No  hepatosplenomegaly. Musculoskeletal: No joint deformity or effusion.  Full range of motion noted. Neurological: No deficits noted on motor, sensory and deep tendon reflex exam. Skin: No petechial rash or dryness.  Appeared moist.   Lab Results: Lab Results  Component Value Date   WBC 6.5 07/12/2018   HGB 8.7 (L) 07/12/2018   HCT 26.9 (L) 07/12/2018   MCV 94.1 07/12/2018   PLT 269 07/12/2018     Chemistry      Component Value Date/Time   NA 132 (L) 07/12/2018 1203   NA 135 (L) 06/11/2017 0753   K 4.1 07/12/2018 1203   K 4.0 06/11/2017 0753   CL 100 07/12/2018 1203   CO2 27 07/12/2018 1203   CO2 26 06/11/2017 0753   BUN 14 07/12/2018 1203   BUN 12.8 06/11/2017 0753   CREATININE 0.89 07/12/2018 1203   CREATININE 0.8 06/11/2017 0753      Component Value Date/Time   CALCIUM 9.0 07/12/2018 1203   CALCIUM 8.8 06/11/2017 0753   ALKPHOS 60 07/12/2018 1203   ALKPHOS 72 06/11/2017 0753   AST 29 07/12/2018 1203   AST 21 06/11/2017 0753   ALT 33 07/12/2018 1203   ALT 13 06/11/2017 0753   BILITOT 0.5 07/12/2018 1203   BILITOT 0.35 06/11/2017 0753      Results for Nicole Keith (MRN [id number]) as of 07/20/2018 10:18  Ref. Range 11/18/2016 09:47 06/11/2017 07:53 11/11/2017 09:48 02/15/2018 11:08 07/12/2018 12:04  IgG (Immunoglobin G), Serum Latest Ref Range: 700 - 1,600 mg/dL 5,507 (H) 6,342 (H) 7,575 (H) 7,247 (H) 8,977 (H)   Results for Nicole Keith (MRN [id number]) as of 07/20/2018 10:18  Ref. Range 11/11/2017 09:48 02/15/2018 11:08 07/12/2018 12:04  M Protein SerPl Elph-Mcnc Latest Ref Range: Not Observed g/dL 4.7 (H) 5.3 (H) 5.9 (H)   EXAM: METASTATIC BONE SURVEY  COMPARISON:  [place]seous survey 11/11/2017 and 11/18/2016.  FINDINGS: No lytic or sclerotic lesion is identified in the axial or appendicular skeleton. No acute bony or joint abnormality is seen. Scattered degenerative disease throughout the spine and mild thoracolumbar scoliosis are noted. There is also  bilateral glenohumeral osteoarthritis and osteoarthritis about the left hip and lateral compartment of the left knee. The patient is status post fixation of a remote healed left tibial plateau fracture and replacement of the right hip. Soft tissue structures demonstrate no acute or focal abnormality. Atherosclerosis noted.  IMPRESSION: Negative for lytic or sclerotic lesion.  No change in spondylosis scattered throughout the spine.  No change in degenerative disease about the left hip, glenohumeral joints and left knee.  Atherosclerosis.     Impression and Plan:  77 year old woman with:  1.  IgG lambda plasma cell disorder diagnosed in April 2016.  She initially presented with smoldering myeloma and currently evolving into symptomatic myeloma.  Laboratory data obtained on July 12, 2018 were personally reviewed and continues to show a rise in her M spike that is currently at 5.9 g/dL.  Her IgG level is 8977.  She is also reporting worsening anemia with hemoglobin has decreased despite normal iron studies.  The  ramification of these findings were reviewed today in detail with the patient and treatment options were discussed.  My recommendation is to proceed with anti-myeloma therapy and she would be a good candidate for Velcade, Cytoxan and dexamethasone based regimen.  Complication associated with this therapy to include nausea, vomiting, myelosuppression, neutropenia and neutropenic sepsis and zoster reactivation were reviewed.  After discussion today she is agreeable to proceed with Velcade and dexamethasone.  Cytoxan will be added at a later date depending on her tolerance.  2.  Anemia: Related to plasma cell disorder.  She does not require transfusion and treating her plasma cell disorder should her help hemoglobin at this time.  Packed red cell transfusion may be needed.  3. Osteoporosis: Skeletal survey obtained in December 2019 did not show any bone lesions.  Will defer  Zometa option for the time being.  4.  Herpes zoster reactivation: She will require acyclovir prophylaxis if she did start on Velcade.  5.  Back\Rib cage pain: Skeletal survey did not show any pathological fractures or malignancy.  I will obtain MRI of the lumbar and thoracic spine urgently given these complaints.  We have talked about radiation therapy as a possible modality if myeloma is detected.  Also give her prescription of oxycodone to use as needed.  6.  Antiemetics: Prescription for Compazine was given to the patient to use as needed.  7. Follow-up: Will be in 1 week to start therapy after chemo education class.  30  minutes was spent with the patient face-to-face today.  More than 50% of time was dedicated to reviewing laboratory data, differential diagnosis of these findings, treatment options and addressing complications related to therapy.  Zola Button, MD 12/10/201910:18 AM

## 2018-07-20 NOTE — Telephone Encounter (Signed)
Printed calendar and avs. °

## 2018-07-21 ENCOUNTER — Encounter: Payer: Self-pay | Admitting: Oncology

## 2018-07-21 ENCOUNTER — Inpatient Hospital Stay: Payer: Medicare Other

## 2018-07-21 NOTE — Progress Notes (Signed)
Met with patient and family to introduce myself as Arboriculturist and to offer available resources.  Discussed one-time $700 Engineer, drilling. Patient and spouse states they are financially stable and declined assistance. Also advised of copay assistance through PAF if needed. They thanked me for the information and declined as well.  Gave them my card for any additional financial questions or concerns.

## 2018-07-24 ENCOUNTER — Ambulatory Visit (HOSPITAL_COMMUNITY)
Admission: RE | Admit: 2018-07-24 | Discharge: 2018-07-24 | Disposition: A | Discharge status: Home / Self Care | Payer: Medicare Other | Source: Ambulatory Visit | Attending: Oncology | Admitting: Oncology

## 2018-07-24 ENCOUNTER — Other Ambulatory Visit: Payer: Self-pay | Admitting: Oncology

## 2018-07-24 DIAGNOSIS — M549 Dorsalgia, unspecified: Secondary | ICD-10-CM | POA: Diagnosis present

## 2018-07-24 DIAGNOSIS — C9000 Multiple myeloma not having achieved remission: Secondary | ICD-10-CM | POA: Diagnosis not present

## 2018-07-24 DIAGNOSIS — J90 Pleural effusion, not elsewhere classified: Secondary | ICD-10-CM | POA: Diagnosis not present

## 2018-07-24 DIAGNOSIS — M4855XA Collapsed vertebra, not elsewhere classified, thoracolumbar region, initial encounter for fracture: Secondary | ICD-10-CM | POA: Insufficient documentation

## 2018-07-24 MED ORDER — GADOBUTROL 1 MMOL/ML IV SOLN
7.0000 mL | Freq: Once | INTRAVENOUS | Status: AC | PRN
  Administered 2018-07-24: 7 mL via INTRAVENOUS

## 2018-07-26 ENCOUNTER — Other Ambulatory Visit: Payer: Self-pay

## 2018-07-26 MED ORDER — OXYCODONE HCL 5 MG PO TABS: 5.0000 mg | ORAL_TABLET | ORAL | 0 refills | Status: DC | PRN

## 2018-07-27 ENCOUNTER — Inpatient Hospital Stay: Payer: Medicare Other

## 2018-07-27 VITALS — BP 153/71 | Temp 97.8°F | Resp 16

## 2018-07-27 DIAGNOSIS — C9000 Multiple myeloma not having achieved remission: Secondary | ICD-10-CM

## 2018-07-27 DIAGNOSIS — Z5112 Encounter for antineoplastic immunotherapy: Secondary | ICD-10-CM | POA: Diagnosis not present

## 2018-07-27 DIAGNOSIS — D472 Monoclonal gammopathy: Secondary | ICD-10-CM

## 2018-07-27 LAB — CBC WITH DIFFERENTIAL (CANCER CENTER ONLY)
Abs Immature Granulocytes: 0.12 10*3/uL — ABNORMAL HIGH (ref 0.00–0.07)
Basophils Absolute: 0 10*3/uL (ref 0.0–0.1)
Basophils Relative: 1 %
Eosinophils Absolute: 0.1 10*3/uL (ref 0.0–0.5)
Eosinophils Relative: 1 %
HCT: 26.9 % — ABNORMAL LOW (ref 36.0–46.0)
Hemoglobin: 8.6 g/dL — ABNORMAL LOW (ref 12.0–15.0)
Immature Granulocytes: 2 %
Lymphocytes Relative: 15 %
Lymphs Abs: 1.2 10*3/uL (ref 0.7–4.0)
MCH: 30.6 pg (ref 26.0–34.0)
MCHC: 32 g/dL (ref 30.0–36.0)
MCV: 95.7 fL (ref 80.0–100.0)
Monocytes Absolute: 0.3 10*3/uL (ref 0.1–1.0)
Monocytes Relative: 3 %
Neutro Abs: 6.5 10*3/uL (ref 1.7–7.7)
Neutrophils Relative %: 78 %
Platelet Count: 353 10*3/uL (ref 150–400)
RBC: 2.81 MIL/uL — ABNORMAL LOW (ref 3.87–5.11)
RDW: 18.1 % — ABNORMAL HIGH (ref 11.5–15.5)
WBC Count: 8.3 10*3/uL (ref 4.0–10.5)
nRBC: 0.4 % — ABNORMAL HIGH (ref 0.0–0.2)

## 2018-07-27 LAB — CMP (CANCER CENTER ONLY)
ALT: 31 U/L (ref 0–44)
AST: 36 U/L (ref 15–41)
Albumin: 2.6 g/dL — ABNORMAL LOW (ref 3.5–5.0)
Alkaline Phosphatase: 72 U/L (ref 38–126)
Anion gap: 7 (ref 5–15)
BUN: 14 mg/dL (ref 8–23)
CO2: 23 mmol/L (ref 22–32)
Calcium: 8.7 mg/dL — ABNORMAL LOW (ref 8.9–10.3)
Chloride: 101 mmol/L (ref 98–111)
Creatinine: 0.83 mg/dL (ref 0.44–1.00)
GFR, Est AFR Am: >60 mL/min (ref >60)
GFR, Estimated: >60 mL/min (ref >60)
Glucose, Bld: 163 mg/dL — ABNORMAL HIGH (ref 70–99)
Potassium: 4.3 mmol/L (ref 3.5–5.1)
Sodium: 131 mmol/L — ABNORMAL LOW (ref 135–145)
Total Bilirubin: 0.3 mg/dL (ref 0.3–1.2)
Total Protein: 13.2 g/dL — ABNORMAL HIGH (ref 6.5–8.1)

## 2018-07-27 MED ORDER — PROCHLORPERAZINE MALEATE 10 MG PO TABS
ORAL_TABLET | ORAL | Status: AC
  Filled 2018-07-27: qty 1

## 2018-07-27 MED ORDER — PROCHLORPERAZINE MALEATE 10 MG PO TABS
10.0000 mg | ORAL_TABLET | Freq: Once | ORAL | Status: AC
  Administered 2018-07-27: 10 mg via ORAL

## 2018-07-27 MED ORDER — BORTEZOMIB CHEMO SQ INJECTION 3.5 MG (2.5MG/ML)
1.5000 mg/m2 | Freq: Once | INTRAMUSCULAR | Status: AC
  Administered 2018-07-27: 2.75 mg via SUBCUTANEOUS
  Filled 2018-07-27: qty 1.1

## 2018-07-27 NOTE — Patient Instructions (Addendum)
[place] Discharge Instructions for Patients Receiving Chemotherapy  Today you received the following chemotherapy agents velcade  To help prevent nausea and vomiting after your treatment, we encourage you to take your nausea medication   If you develop nausea and vomiting that is not controlled by your nausea medication, call the clinic.   BELOW ARE SYMPTOMS THAT SHOULD BE REPORTED IMMEDIATELY:  *FEVER GREATER THAN 100.5 F  *CHILLS WITH OR WITHOUT FEVER  NAUSEA AND VOMITING THAT IS NOT CONTROLLED WITH YOUR NAUSEA MEDICATION  *UNUSUAL SHORTNESS OF BREATH  *UNUSUAL BRUISING OR BLEEDING  TENDERNESS IN MOUTH AND THROAT WITH OR WITHOUT PRESENCE OF ULCERS  *URINARY PROBLEMS  *BOWEL PROBLEMS  UNUSUAL RASH Items with * indicate a potential emergency and should be followed up as soon as possible.  Feel free to call the clinic should you have any questions or concerns. The clinic phone number is [phone].  Please show the [facility] at check-in to the Emergency Department and triage nurse.    Bortezomib injection What is this medicine? BORTEZOMIB (bor TEZ oh mib) is a medicine that targets proteins in cancer cells and stops the cancer cells from growing. It is used to treat multiple myeloma and mantle-cell lymphoma. This medicine may be used for other purposes; ask your health care provider or pharmacist if you have questions. COMMON BRAND NAME(S): Velcade What should I tell my health care provider before I take this medicine? They need to know if you have any of these conditions: -diabetes -heart disease -irregular heartbeat -liver disease -on hemodialysis -low blood counts, like low white blood cells, platelets, or hemoglobin -peripheral neuropathy -taking medicine for blood pressure -an unusual or allergic reaction to bortezomib, mannitol, boron, other medicines, foods, dyes, or preservatives -pregnant or trying to get  pregnant -breast-feeding How should I use this medicine? This medicine is for injection into a vein or for injection under the skin. It is given by a health care professional in a hospital or clinic setting. Talk to your pediatrician regarding the use of this medicine in children. Special care may be needed. Overdosage: If you think you have taken too much of this medicine contact a poison control center or emergency room at once. NOTE: This medicine is only for you. Do not share this medicine with others. What if I miss a dose? It is important not to miss your dose. Call your doctor or health care professional if you are unable to keep an appointment. What may interact with this medicine? This medicine may interact with the following medications: -ketoconazole -rifampin -ritonavir -St. John's Wort This list may not describe all possible interactions. Give your health care provider a list of all the medicines, herbs, non-prescription drugs, or dietary supplements you use. Also tell them if you smoke, drink alcohol, or use illegal drugs. Some items may interact with your medicine. What should I watch for while using this medicine? You may get drowsy or dizzy. Do not drive, use machinery, or do anything that needs mental alertness until you know how this medicine affects you. Do not stand or sit up quickly, especially if you are an older patient. This reduces the risk of dizzy or fainting spells. In some cases, you may be given additional medicines to help with side effects. Follow all directions for their use. Call your doctor or health care professional for advice if you get a fever, chills or sore throat, or other symptoms of a cold or flu. Do not treat  treat yourself. This drug decreases your body's ability to fight infections. Try to avoid being around people who are sick. This medicine may increase your risk to bruise or bleed. Call your doctor or health care professional if you notice any unusual  bleeding. You may need blood work done while you are taking this medicine. In some patients, this medicine may cause a serious brain infection that may cause death. If you have any problems seeing, thinking, speaking, walking, or standing, tell your doctor right away. If you cannot reach your doctor, urgently seek other source of medical care. Check with your doctor or health care professional if you get an attack of severe diarrhea, nausea and vomiting, or if you sweat a lot. The loss of too much body fluid can make it dangerous for you to take this medicine. Do not become pregnant while taking this medicine or for at least 2 months after stopping it. Women should inform their doctor if they wish to become pregnant or think they might be pregnant. Men should not father a child while taking this medicine and for at least 2 months after stopping it. There is a potential for serious side effects to an unborn child. Talk to your health care professional or pharmacist for more information. Do not breast-feed an infant while taking this medicine or for 2 months after stopping it. This medicine may interfere with the ability to have a child. You should talk with your doctor or health care professional if you are concerned about your fertility. What side effects may I notice from receiving this medicine? Side effects that you should report to your doctor or health care professional as soon as possible: -allergic reactions like skin rash, itching or hives, swelling of the face, lips, or tongue -breathing problems -changes in hearing -changes in vision -fast, irregular heartbeat -feeling faint or lightheaded, falls -pain, tingling, numbness in the hands or feet -right upper belly pain -seizures -swelling of the ankles, feet, hands -unusual bleeding or bruising -unusually weak or tired -vomiting -yellowing of the eyes or skin Side effects that usually do not require medical attention (report to your  doctor or health care professional if they continue or are bothersome): -changes in emotions or moods -constipation -diarrhea -loss of appetite -headache -irritation at site where injected -nausea This list may not describe all possible side effects. Call your doctor for medical advice about side effects. You may report side effects to FDA at 1-800-FDA-1088. Where should I keep my medicine? This drug is given in a hospital or clinic and will not be stored at home. NOTE: This sheet is a summary. It may not cover all possible information. If you have questions about this medicine, talk to your doctor, pharmacist, or health care provider.  2018 Elsevier/Gold Standard (2016-06-26 15:53:51)   

## 2018-07-28 ENCOUNTER — Telehealth: Payer: Self-pay | Admitting: *Deleted

## 2018-07-28 NOTE — Telephone Encounter (Signed)
Spoke with patient. States she is eating, drinking and sleeping well. Denies n/v or diarrhea. Knows to call this office if any issues arise.

## 2018-08-03 ENCOUNTER — Inpatient Hospital Stay (HOSPITAL_BASED_OUTPATIENT_CLINIC_OR_DEPARTMENT_OTHER): Payer: Medicare Other | Admitting: Oncology

## 2018-08-03 ENCOUNTER — Telehealth: Payer: Self-pay

## 2018-08-03 ENCOUNTER — Inpatient Hospital Stay: Payer: Medicare Other

## 2018-08-03 VITALS — BP 143/71 | HR 102 | Temp 98.3°F | Resp 18 | Ht 66.0 in | Wt 138.1 lb

## 2018-08-03 DIAGNOSIS — D472 Monoclonal gammopathy: Secondary | ICD-10-CM

## 2018-08-03 DIAGNOSIS — M810 Age-related osteoporosis without current pathological fracture: Secondary | ICD-10-CM

## 2018-08-03 DIAGNOSIS — M8448XA Pathological fracture, other site, initial encounter for fracture: Secondary | ICD-10-CM | POA: Diagnosis not present

## 2018-08-03 DIAGNOSIS — C9000 Multiple myeloma not having achieved remission: Secondary | ICD-10-CM

## 2018-08-03 DIAGNOSIS — J90 Pleural effusion, not elsewhere classified: Secondary | ICD-10-CM

## 2018-08-03 DIAGNOSIS — Z5112 Encounter for antineoplastic immunotherapy: Secondary | ICD-10-CM | POA: Diagnosis not present

## 2018-08-03 DIAGNOSIS — M549 Dorsalgia, unspecified: Secondary | ICD-10-CM

## 2018-08-03 LAB — CBC WITH DIFFERENTIAL (CANCER CENTER ONLY)
Abs Immature Granulocytes: 0.13 10*3/uL — ABNORMAL HIGH (ref 0.00–0.07)
Basophils Absolute: 0 10*3/uL (ref 0.0–0.1)
Basophils Relative: 1 %
Eosinophils Absolute: 0.1 10*3/uL (ref 0.0–0.5)
Eosinophils Relative: 1 %
HCT: 27.9 % — ABNORMAL LOW (ref 36.0–46.0)
Hemoglobin: 8.9 g/dL — ABNORMAL LOW (ref 12.0–15.0)
Immature Granulocytes: 2 %
Lymphocytes Relative: 16 %
Lymphs Abs: 1.3 10*3/uL (ref 0.7–4.0)
MCH: 30.7 pg (ref 26.0–34.0)
MCHC: 31.9 g/dL (ref 30.0–36.0)
MCV: 96.2 fL (ref 80.0–100.0)
Monocytes Absolute: 0.4 10*3/uL (ref 0.1–1.0)
Monocytes Relative: 5 %
Neutro Abs: 6 10*3/uL (ref 1.7–7.7)
Neutrophils Relative %: 75 %
Platelet Count: 321 10*3/uL (ref 150–400)
RBC: 2.9 MIL/uL — ABNORMAL LOW (ref 3.87–5.11)
RDW: 18.2 % — ABNORMAL HIGH (ref 11.5–15.5)
WBC Count: 7.9 10*3/uL (ref 4.0–10.5)
nRBC: 0 % (ref 0.0–0.2)

## 2018-08-03 LAB — CMP (CANCER CENTER ONLY)
ALT: 22 U/L (ref 0–44)
AST: 22 U/L (ref 15–41)
Albumin: 2.6 g/dL — ABNORMAL LOW (ref 3.5–5.0)
Alkaline Phosphatase: 62 U/L (ref 38–126)
Anion gap: 4 — ABNORMAL LOW (ref 5–15)
BUN: 13 mg/dL (ref 8–23)
CO2: 24 mmol/L (ref 22–32)
Calcium: 8.6 mg/dL — ABNORMAL LOW (ref 8.9–10.3)
Chloride: 100 mmol/L (ref 98–111)
Creatinine: 0.97 mg/dL (ref 0.44–1.00)
GFR, Est AFR Am: >60 mL/min (ref >60)
GFR, Estimated: 56 mL/min — ABNORMAL LOW (ref >60)
Glucose, Bld: 191 mg/dL — ABNORMAL HIGH (ref 70–99)
Potassium: 4.2 mmol/L (ref 3.5–5.1)
Sodium: 128 mmol/L — ABNORMAL LOW (ref 135–145)
Total Bilirubin: 0.2 mg/dL — ABNORMAL LOW (ref 0.3–1.2)
Total Protein: 13.1 g/dL — ABNORMAL HIGH (ref 6.5–8.1)

## 2018-08-03 MED ORDER — PROCHLORPERAZINE MALEATE 10 MG PO TABS
10.0000 mg | ORAL_TABLET | Freq: Once | ORAL | Status: AC
  Administered 2018-08-03: 10 mg via ORAL

## 2018-08-03 MED ORDER — FENTANYL 25 MCG/HR TD PT72: 25.0000 ug | MEDICATED_PATCH | TRANSDERMAL | 0 refills | Status: DC

## 2018-08-03 MED ORDER — OXYCODONE HCL 5 MG PO TABS: 5.0000 mg | ORAL_TABLET | ORAL | 0 refills | Status: DC | PRN

## 2018-08-03 MED ORDER — BORTEZOMIB CHEMO SQ INJECTION 3.5 MG (2.5MG/ML)
1.5000 mg/m2 | Freq: Once | INTRAMUSCULAR | Status: AC
  Administered 2018-08-03: 2.75 mg via SUBCUTANEOUS
  Filled 2018-08-03: qty 1.1

## 2018-08-03 MED ORDER — PROCHLORPERAZINE MALEATE 10 MG PO TABS
ORAL_TABLET | ORAL | Status: AC
  Filled 2018-08-03: qty 1

## 2018-08-03 NOTE — Telephone Encounter (Signed)
Per 12/24 added inf for 12/31 in the Infusion aproval BOOK. Printed avs and calender of upcoming appointment.

## 2018-08-03 NOTE — Patient Instructions (Signed)
[facility] Discharge Instructions for Patients Receiving Chemotherapy  Today you received the following chemotherapy agents Bortezomib (VELCADE).  To help prevent nausea and vomiting after your treatment, we encourage you to take your nausea medication as prescribed.   If you develop nausea and vomiting that is not controlled by your nausea medication, call the clinic.   BELOW ARE SYMPTOMS THAT SHOULD BE REPORTED IMMEDIATELY:  *FEVER GREATER THAN 100.5 F  *CHILLS WITH OR WITHOUT FEVER  NAUSEA AND VOMITING THAT IS NOT CONTROLLED WITH YOUR NAUSEA MEDICATION  *UNUSUAL SHORTNESS OF BREATH  *UNUSUAL BRUISING OR BLEEDING  TENDERNESS IN MOUTH AND THROAT WITH OR WITHOUT PRESENCE OF ULCERS  *URINARY PROBLEMS  *BOWEL PROBLEMS  UNUSUAL RASH Items with * indicate a potential emergency and should be followed up as soon as possible.  Feel free to call the clinic should you have any questions or concerns. The clinic phone number is [phone].  Please show the CHEMO ALERT CARD at check-in to the Emergency Department and triage nurse.   

## 2018-08-03 NOTE — Progress Notes (Signed)
Hematology and Oncology Follow Up Visit  Nicole Keith [id number] 03/11/1941 77 y.o. 08/03/2018 12:02 PM Nicole Keith, [name], Nicole Lies, MD   Principle Diagnosis: 77 year old woman with IgG lambda multiple myeloma became symptomatic in December 2019 after diagnosed with MGUS in 2016.  Initially presented with plasma cell disorder diagnosed in April 2016.  .   Current therapy: Velcade and dexamethasone started weekly on 07/27/2018.  Interim History:  [name] is here for a follow-up visit.  Since the last visit, she received the first day weekly Velcade and dexamethasone without any complications.  She denies any nausea, fatigue or neuropathy.  She continues to have issues with back pain associated with her compression fracture and oxycodone has helped her at nighttime but she has to use it every 4 hours around-the-clock.  She does report decline in her appetite but no other complications.  She remains ambulatory without any recent falls or syncope.  She denies any neurological deficits.  She does not report any  blurry vision, double vision, syncope or seizures.  She denies any alteration mental status or dizziness.  She does not report any fevers, chills, sweats. She does not report any chest pain, palpitation, orthopnea or leg edema. She does not report any cough, wheezing, hemoptysis or dyspnea on exertion.  She does not report any nausea, vomiting, or early satiety.  She denies any constipation or diarrhea.  She does not report any frequency, urgency, hesitancy. She does not report any bleeding tendency.  Denies any changes in her mood.  Remaining review of systems is negative.  Medications: I have reviewed the patient's current medications.  Current Outpatient Medications  Medication Sig Dispense Refill  . acyclovir (ZOVIRAX) 400 MG tablet Take 1 tablet (400 mg total) by mouth 2 (two) times daily. 90 tablet 3  . dexamethasone (DECADRON) 4 MG tablet 5 tablets weekly with chemotherapy 100  tablet 3  . diphenhydrAMINE (BENADRYL) 25 mg capsule Take 25 mg by mouth at bedtime as needed.    . famotidine (PEPCID) 20 MG tablet Take 20 mg by mouth daily.    . fentaNYL (DURAGESIC - DOSED MCG/HR) 25 MCG/HR patch Place 1 patch (25 mcg total) onto the skin every 3 (three) days. 10 patch 0  . ferrous sulfate 325 (65 FE) MG EC tablet Take 1 tablet (325 mg total) by mouth daily with breakfast. 90 tablet 3  . ibuprofen (ADVIL,MOTRIN) 200 MG tablet Take 400 mg 3 (three) times daily by mouth.     . loratadine (CLARITIN) 10 MG tablet Take 10 mg by mouth daily.    [name] Kitchen oxyCODONE (OXY IR/ROXICODONE) 5 MG immediate release tablet Take 1 tablet (5 mg total) by mouth every 4 (four) hours as needed for severe pain. 20 tablet 0  . oxyCODONE (OXY IR/ROXICODONE) 5 MG immediate release tablet Take 1 tablet (5 mg total) by mouth every 4 (four) hours as needed for severe pain. 90 tablet 0  . Probiotic Product (ALIGN PO) Take by mouth.    . prochlorperazine (COMPAZINE) 10 MG tablet Take 1 tablet (10 mg total) by mouth every 6 (six) hours as needed for nausea or vomiting. 30 tablet 0   No current facility-administered medications for this visit.      Allergies:  Allergies  Allergen Reactions  . Betadine [Povidone Iodine] Rash  . Flagyl [Metronidazole] Diarrhea  . Fosamax [Alendronate Sodium] Other (See Comments)    GI distress  . Bactrim [Sulfamethoxazole-Trimethoprim]     nausea  . Doxepin Hcl  Irritable, hallucinations  . Hydroxyzine Hcl     hallucinations  . Valium [Diazepam] Other (See Comments)  . Actonel [Risedronate Sodium] Other (See Comments)    GI distress  . Boniva [Ibandronic Acid] Other (See Comments)    GI distress  . Doxycycline Rash  . Penicillins Rash    Ancef was OK.    Past Medical History, Surgical history, Social history, and Family History were reviewed and updated.   Physical Exam:  Blood pressure (!) 143/71, pulse (!) 102, temperature 98.3 F (36.8 C), temperature  source Oral, resp. rate 18, height '5\' 6"'  (1.676 m), weight 138 lb 1.6 oz (62.6 kg), SpO2 97 %.    ECOG: 1     General appearance: Alert, awake without any distress. Head: Atraumatic without abnormalities Oropharynx: Without any thrush or ulcers. Eyes: No scleral icterus. Lymph nodes: No lymphadenopathy noted in the cervical, supraclavicular, or axillary nodes Heart:regular rate and rhythm, without any murmurs or gallops.   Lung: Clear to auscultation without any rhonchi, wheezes or dullness to percussion. Abdomin: Soft, nontender without any shifting dullness or ascites. Musculoskeletal: No clubbing or cyanosis. Neurological: No motor or sensory deficits. Skin: No rashes or lesions. Psychiatric: Mood and affect appeared normal.  Lab Results: Lab Results  Component Value Date   WBC 7.9 08/03/2018   HGB 8.9 (L) 08/03/2018   HCT 27.9 (L) 08/03/2018   MCV 96.2 08/03/2018   PLT 321 08/03/2018     Chemistry      Component Value Date/Time   NA 128 (L) 08/03/2018 1106   NA 135 (L) 06/11/2017 0753   K 4.2 08/03/2018 1106   K 4.0 06/11/2017 0753   CL 100 08/03/2018 1106   CO2 24 08/03/2018 1106   CO2 26 06/11/2017 0753   BUN 13 08/03/2018 1106   BUN 12.8 06/11/2017 0753   CREATININE 0.97 08/03/2018 1106   CREATININE 0.8 06/11/2017 0753      Component Value Date/Time   CALCIUM 8.6 (L) 08/03/2018 1106   CALCIUM 8.8 06/11/2017 0753   ALKPHOS 62 08/03/2018 1106   ALKPHOS 72 06/11/2017 0753   AST 22 08/03/2018 1106   AST 21 06/11/2017 0753   ALT 22 08/03/2018 1106   ALT 13 06/11/2017 0753   BILITOT 0.2 (L) 08/03/2018 1106   BILITOT 0.35 06/11/2017 0753      IMPRESSION: 1. Widespread bone marrow lesions in a pattern consistent with multiple myeloma. The largest lesions are located at T7, T8, T9, L4 and S1. 2. Pathologic compression deformities of indeterminate age at T8, T9 and L4. L4 height loss appears slightly worsened from the 07/12/2018 radiograph. The T8 and  T9 lesions are unchanged. 3. No high-grade spinal canal stenosis or contrast enhancing epidural disease. 4. Bilateral pleural effusions.    Impression and Plan:  77 year old woman with:  1.  IgG lambda multiple myeloma that became symptomatic in December 2019.    She started Velcade and dexamethasone without any complications at this time.  Consideration to add Cytoxan or Revlimid in the future depending on her tolerance.  Risks and benefits of continuing this therapy for the time being were assessed and she is agreeable to continue.  2.  Anemia: Hemoglobin relatively stable at this time without any need for any transfusion.  Her anemia is related to plasma cell disorder.  3. Osteoporosis and compression fracture: Risks and benefits of adding Zometa to her treatment were reviewed today.  Complications including osteonecrosis of the jaw among others were discussed.  Plan to  start Zometa in the next week or so.  This will be repeated every 4 to 6 weeks.  4.  Herpes zoster reactivation: She is on acyclovir without any reactivation at this time.  5.  Back pain: Related to pathological fracture new compression fracture noted on her MRI.  MRI of the spine thoracic and lumbar obtained on 07/24/2018 was personally reviewed and confirm these findings.  I will start her on long acting pain medication at this time in the form of fentanyl with instructions how to use it.  She will continue to use oxycodone for breakthrough.  We have also discussed the possibility for interventional radiology referral for possible kyphoplasty.  6.  Antiemetics: No nausea or vomiting reported at this time.  It is available to her.  7. Follow-up: Will be weekly for Velcade and dexamethasone and in 3 weeks for an MD follow-up.  25  minutes was spent with the patient face-to-face today.  More than 50% of time was dedicated to discussing imaging studies, managing complications related to her diagnosis and  treatment.   Zola Button, MD 12/24/201912:02 PM

## 2018-08-06 ENCOUNTER — Telehealth: Payer: Self-pay

## 2018-08-06 NOTE — Telephone Encounter (Signed)
Per 12/24 los already scheduled waiting for infusion for 12/31. Patient aware of approval needed

## 2018-08-09 NOTE — Telephone Encounter (Signed)
Lab/infusion added for 12/27 (infusion log). Spoke with patient confirming lab/infusion 12/27 @ 8 am. Patient will get updated schedule tomorrow ([facility]).

## 2018-08-10 ENCOUNTER — Inpatient Hospital Stay: Payer: Medicare Other

## 2018-08-10 ENCOUNTER — Other Ambulatory Visit: Payer: Self-pay | Admitting: Oncology

## 2018-08-10 ENCOUNTER — Other Ambulatory Visit: Payer: Medicare Other

## 2018-08-10 VITALS — BP 146/73 | HR 94 | Temp 98.9°F | Resp 16

## 2018-08-10 DIAGNOSIS — Z5112 Encounter for antineoplastic immunotherapy: Secondary | ICD-10-CM | POA: Diagnosis not present

## 2018-08-10 DIAGNOSIS — M8000XD Age-related osteoporosis with current pathological fracture, unspecified site, subsequent encounter for fracture with routine healing: Secondary | ICD-10-CM

## 2018-08-10 DIAGNOSIS — C9000 Multiple myeloma not having achieved remission: Secondary | ICD-10-CM

## 2018-08-10 DIAGNOSIS — D472 Monoclonal gammopathy: Secondary | ICD-10-CM

## 2018-08-10 LAB — CBC WITH DIFFERENTIAL (CANCER CENTER ONLY)
Abs Immature Granulocytes: 0.06 10*3/uL (ref 0.00–0.07)
Basophils Absolute: 0.1 10*3/uL (ref 0.0–0.1)
Basophils Relative: 1 %
Eosinophils Absolute: 0.2 10*3/uL (ref 0.0–0.5)
Eosinophils Relative: 2 %
HCT: 27.5 % — ABNORMAL LOW (ref 36.0–46.0)
Hemoglobin: 8.5 g/dL — ABNORMAL LOW (ref 12.0–15.0)
Immature Granulocytes: 1 %
Lymphocytes Relative: 17 %
Lymphs Abs: 1.4 10*3/uL (ref 0.7–4.0)
MCH: 30.5 pg (ref 26.0–34.0)
MCHC: 30.9 g/dL (ref 30.0–36.0)
MCV: 98.6 fL (ref 80.0–100.0)
Monocytes Absolute: 0.6 10*3/uL (ref 0.1–1.0)
Monocytes Relative: 7 %
Neutro Abs: 6.2 10*3/uL (ref 1.7–7.7)
Neutrophils Relative %: 72 %
Platelet Count: 262 10*3/uL (ref 150–400)
RBC: 2.79 MIL/uL — ABNORMAL LOW (ref 3.87–5.11)
RDW: 18.4 % — ABNORMAL HIGH (ref 11.5–15.5)
WBC Count: 8.5 10*3/uL (ref 4.0–10.5)
nRBC: 0 % (ref 0.0–0.2)

## 2018-08-10 LAB — CMP (CANCER CENTER ONLY)
ALT: 25 U/L (ref 0–44)
AST: 26 U/L (ref 15–41)
Albumin: 2.5 g/dL — ABNORMAL LOW (ref 3.5–5.0)
Alkaline Phosphatase: 66 U/L (ref 38–126)
Anion gap: 7 (ref 5–15)
BUN: 12 mg/dL (ref 8–23)
CO2: 24 mmol/L (ref 22–32)
Calcium: 8.7 mg/dL — ABNORMAL LOW (ref 8.9–10.3)
Chloride: 99 mmol/L (ref 98–111)
Creatinine: 0.87 mg/dL (ref 0.44–1.00)
GFR, Est AFR Am: >60 mL/min (ref >60)
GFR, Estimated: >60 mL/min (ref >60)
Glucose, Bld: 183 mg/dL — ABNORMAL HIGH (ref 70–99)
Potassium: 4.3 mmol/L (ref 3.5–5.1)
Sodium: 130 mmol/L — ABNORMAL LOW (ref 135–145)
Total Bilirubin: 0.4 mg/dL (ref 0.3–1.2)
Total Protein: 12.4 g/dL — ABNORMAL HIGH (ref 6.5–8.1)

## 2018-08-10 MED ORDER — PROCHLORPERAZINE MALEATE 10 MG PO TABS
10.0000 mg | ORAL_TABLET | Freq: Once | ORAL | Status: AC
  Administered 2018-08-10: 10 mg via ORAL

## 2018-08-10 MED ORDER — ZOLEDRONIC ACID 4 MG/100ML IV SOLN
4.0000 mg | Freq: Once | INTRAVENOUS | Status: AC
  Administered 2018-08-10: 4 mg via INTRAVENOUS
  Filled 2018-08-10: qty 100

## 2018-08-10 MED ORDER — PROCHLORPERAZINE MALEATE 10 MG PO TABS
ORAL_TABLET | ORAL | Status: AC
  Filled 2018-08-10: qty 1

## 2018-08-10 MED ORDER — SODIUM CHLORIDE 0.9 % IV SOLN
Freq: Once | INTRAVENOUS | Status: DC
  Filled 2018-08-10: qty 250

## 2018-08-10 MED ORDER — BORTEZOMIB CHEMO SQ INJECTION 3.5 MG (2.5MG/ML)
1.5000 mg/m2 | Freq: Once | INTRAMUSCULAR | Status: AC
  Administered 2018-08-10: 2.75 mg via SUBCUTANEOUS
  Filled 2018-08-10: qty 1.1

## 2018-08-10 NOTE — Patient Instructions (Signed)
[facility] Discharge Instructions for Patients Receiving Chemotherapy  Today you received the following chemotherapy agents Bortezomib (VELCADE).  To help prevent nausea and vomiting after your treatment, we encourage you to take your nausea medication as prescribed.   If you develop nausea and vomiting that is not controlled by your nausea medication, call the clinic.   BELOW ARE SYMPTOMS THAT SHOULD BE REPORTED IMMEDIATELY:  *FEVER GREATER THAN 100.5 F  *CHILLS WITH OR WITHOUT FEVER  NAUSEA AND VOMITING THAT IS NOT CONTROLLED WITH YOUR NAUSEA MEDICATION  *UNUSUAL SHORTNESS OF BREATH  *UNUSUAL BRUISING OR BLEEDING  TENDERNESS IN MOUTH AND THROAT WITH OR WITHOUT PRESENCE OF ULCERS  *URINARY PROBLEMS  *BOWEL PROBLEMS  UNUSUAL RASH Items with * indicate a potential emergency and should be followed up as soon as possible.  Feel free to call the clinic should you have any questions or concerns. The clinic phone number is [phone].  Please show the CHEMO ALERT CARD at check-in to the Emergency Department and triage nurse.   

## 2018-08-11 DIAGNOSIS — C801 Malignant (primary) neoplasm, unspecified: Secondary | ICD-10-CM

## 2018-08-11 HISTORY — DX: Malignant (primary) neoplasm, unspecified: C80.1

## 2018-08-17 ENCOUNTER — Inpatient Hospital Stay: Payer: Medicare Other

## 2018-08-17 ENCOUNTER — Inpatient Hospital Stay: Payer: Medicare Other | Attending: Oncology

## 2018-08-17 VITALS — BP 138/70 | HR 100 | Temp 98.7°F | Resp 18 | Wt 140.8 lb

## 2018-08-17 DIAGNOSIS — Z5112 Encounter for antineoplastic immunotherapy: Secondary | ICD-10-CM | POA: Insufficient documentation

## 2018-08-17 DIAGNOSIS — C9000 Multiple myeloma not having achieved remission: Secondary | ICD-10-CM | POA: Insufficient documentation

## 2018-08-17 DIAGNOSIS — D472 Monoclonal gammopathy: Secondary | ICD-10-CM

## 2018-08-17 LAB — CBC WITH DIFFERENTIAL (CANCER CENTER ONLY)
Abs Immature Granulocytes: 0.07 10*3/uL (ref 0.00–0.07)
Basophils Absolute: 0 10*3/uL (ref 0.0–0.1)
Basophils Relative: 1 %
Eosinophils Absolute: 0.1 10*3/uL (ref 0.0–0.5)
Eosinophils Relative: 2 %
HCT: 26 % — ABNORMAL LOW (ref 36.0–46.0)
Hemoglobin: 8 g/dL — ABNORMAL LOW (ref 12.0–15.0)
Immature Granulocytes: 1 %
Lymphocytes Relative: 13 %
Lymphs Abs: 1 10*3/uL (ref 0.7–4.0)
MCH: 30.5 pg (ref 26.0–34.0)
MCHC: 30.8 g/dL (ref 30.0–36.0)
MCV: 99.2 fL (ref 80.0–100.0)
Monocytes Absolute: 0.4 10*3/uL (ref 0.1–1.0)
Monocytes Relative: 5 %
Neutro Abs: 6.4 10*3/uL (ref 1.7–7.7)
Neutrophils Relative %: 78 %
Platelet Count: 303 10*3/uL (ref 150–400)
RBC: 2.62 MIL/uL — ABNORMAL LOW (ref 3.87–5.11)
RDW: 18.8 % — ABNORMAL HIGH (ref 11.5–15.5)
WBC Count: 8.1 10*3/uL (ref 4.0–10.5)
nRBC: 0.2 % (ref 0.0–0.2)

## 2018-08-17 LAB — CMP (CANCER CENTER ONLY)
ALT: 40 U/L (ref 0–44)
AST: 27 U/L (ref 15–41)
Albumin: 2.5 g/dL — ABNORMAL LOW (ref 3.5–5.0)
Alkaline Phosphatase: 72 U/L (ref 38–126)
Anion gap: 5 (ref 5–15)
BUN: 10 mg/dL (ref 8–23)
CO2: 21 mmol/L — ABNORMAL LOW (ref 22–32)
Calcium: 8 mg/dL — ABNORMAL LOW (ref 8.9–10.3)
Chloride: 103 mmol/L (ref 98–111)
Creatinine: 0.82 mg/dL (ref 0.44–1.00)
GFR, Est AFR Am: >60 mL/min (ref >60)
GFR, Estimated: >60 mL/min (ref >60)
Glucose, Bld: 188 mg/dL — ABNORMAL HIGH (ref 70–99)
Potassium: 4.7 mmol/L (ref 3.5–5.1)
Sodium: 129 mmol/L — ABNORMAL LOW (ref 135–145)
Total Bilirubin: 0.4 mg/dL (ref 0.3–1.2)
Total Protein: 12 g/dL — ABNORMAL HIGH (ref 6.5–8.1)

## 2018-08-17 MED ORDER — PROCHLORPERAZINE MALEATE 10 MG PO TABS
10.0000 mg | ORAL_TABLET | Freq: Once | ORAL | Status: AC
  Administered 2018-08-17: 10 mg via ORAL

## 2018-08-17 MED ORDER — BORTEZOMIB CHEMO SQ INJECTION 3.5 MG (2.5MG/ML)
1.5000 mg/m2 | Freq: Once | INTRAMUSCULAR | Status: AC
  Administered 2018-08-17: 2.75 mg via SUBCUTANEOUS
  Filled 2018-08-17: qty 1.1

## 2018-08-17 MED ORDER — PROCHLORPERAZINE MALEATE 10 MG PO TABS
ORAL_TABLET | ORAL | Status: AC
  Filled 2018-08-17: qty 1

## 2018-08-17 NOTE — Patient Instructions (Signed)
[facility] Discharge Instructions for Patients Receiving Chemotherapy  Today you received the following chemotherapy agents Velcade.  To help prevent nausea and vomiting after your treatment, we encourage you to take your nausea medication as directed.  If you develop nausea and vomiting that is not controlled by your nausea medication, call the clinic.   BELOW ARE SYMPTOMS THAT SHOULD BE REPORTED IMMEDIATELY:  *FEVER GREATER THAN 100.5 F  *CHILLS WITH OR WITHOUT FEVER  NAUSEA AND VOMITING THAT IS NOT CONTROLLED WITH YOUR NAUSEA MEDICATION  *UNUSUAL SHORTNESS OF BREATH  *UNUSUAL BRUISING OR BLEEDING  TENDERNESS IN MOUTH AND THROAT WITH OR WITHOUT PRESENCE OF ULCERS  *URINARY PROBLEMS  *BOWEL PROBLEMS  UNUSUAL RASH Items with * indicate a potential emergency and should be followed up as soon as possible.  Feel free to call the clinic should you have any questions or concerns. The clinic phone number is [phone].  Please show the CHEMO ALERT CARD at check-in to the Emergency Department and triage nurse.   

## 2018-08-18 LAB — KAPPA/LAMBDA LIGHT CHAINS
Kappa free light chain: 6.9 mg/L (ref 3.3–19.4)
Kappa, lambda light chain ratio: 0.03 — ABNORMAL LOW (ref 0.26–1.65)
Lambda free light chains: 255.2 mg/L — ABNORMAL HIGH (ref 5.7–26.3)

## 2018-08-19 LAB — MULTIPLE MYELOMA PANEL, SERUM
Albumin SerPl Elph-Mcnc: 3.6 g/dL (ref 2.9–4.4)
Albumin/Glob SerPl: 0.5 — ABNORMAL LOW (ref 0.7–1.7)
Alpha 1: 0.3 g/dL (ref 0.0–0.4)
Alpha2 Glob SerPl Elph-Mcnc: 0.8 g/dL (ref 0.4–1.0)
B-Globulin SerPl Elph-Mcnc: 0.8 g/dL (ref 0.7–1.3)
Gamma Glob SerPl Elph-Mcnc: 5.8 g/dL — ABNORMAL HIGH (ref 0.4–1.8)
Globulin, Total: 7.7 g/dL — ABNORMAL HIGH (ref 2.2–3.9)
IgA: 21 mg/dL — ABNORMAL LOW (ref 64–422)
IgG (Immunoglobin G), Serum: 8129 mg/dL — ABNORMAL HIGH (ref 700–1600)
IgM (Immunoglobulin M), Srm: 26 mg/dL (ref 26–217)
M Protein SerPl Elph-Mcnc: 5.6 g/dL — ABNORMAL HIGH
Total Protein ELP: 11.3 g/dL — ABNORMAL HIGH (ref 6.0–8.5)

## 2018-08-23 ENCOUNTER — Telehealth: Payer: Self-pay

## 2018-08-23 NOTE — Telephone Encounter (Signed)
Received a call from the patient stating that she feels like she may need iron infusions for the low hemoglobin. Provided education regarding iron vs hemoglobin and that Dr. Alen Blew is monitoring her labs for any necessary interventions. Patient verbalized understanding and stated that she did not feel that she needed the infusions but a friend told her to ask. She stated that she feels great except for the continued back pain but the pain medications seem to be managing it for right now. She verbalized understanding and had no other questions or concerns.

## 2018-08-24 ENCOUNTER — Inpatient Hospital Stay: Payer: Medicare Other

## 2018-08-24 ENCOUNTER — Other Ambulatory Visit: Payer: Self-pay | Admitting: Oncology

## 2018-08-24 VITALS — BP 135/69 | HR 100 | Temp 98.3°F | Resp 16

## 2018-08-24 DIAGNOSIS — D472 Monoclonal gammopathy: Secondary | ICD-10-CM

## 2018-08-24 DIAGNOSIS — Z5112 Encounter for antineoplastic immunotherapy: Secondary | ICD-10-CM | POA: Diagnosis not present

## 2018-08-24 DIAGNOSIS — C9000 Multiple myeloma not having achieved remission: Secondary | ICD-10-CM

## 2018-08-24 LAB — CMP (CANCER CENTER ONLY)
ALT: 27 U/L (ref 0–44)
AST: 26 U/L (ref 15–41)
Albumin: 2.5 g/dL — ABNORMAL LOW (ref 3.5–5.0)
Alkaline Phosphatase: 72 U/L (ref 38–126)
Anion gap: 4 — ABNORMAL LOW (ref 5–15)
BUN: 13 mg/dL (ref 8–23)
CO2: 23 mmol/L (ref 22–32)
Calcium: 8.1 mg/dL — ABNORMAL LOW (ref 8.9–10.3)
Chloride: 102 mmol/L (ref 98–111)
Creatinine: 0.78 mg/dL (ref 0.44–1.00)
GFR, Est AFR Am: >60 mL/min (ref >60)
GFR, Estimated: >60 mL/min (ref >60)
Glucose, Bld: 163 mg/dL — ABNORMAL HIGH (ref 70–99)
Potassium: 4.6 mmol/L (ref 3.5–5.1)
Sodium: 129 mmol/L — ABNORMAL LOW (ref 135–145)
Total Bilirubin: 0.4 mg/dL (ref 0.3–1.2)
Total Protein: 11.8 g/dL — ABNORMAL HIGH (ref 6.5–8.1)

## 2018-08-24 LAB — CBC WITH DIFFERENTIAL (CANCER CENTER ONLY)
Abs Immature Granulocytes: 0.05 10*3/uL (ref 0.00–0.07)
Basophils Absolute: 0 10*3/uL (ref 0.0–0.1)
Basophils Relative: 0 %
Eosinophils Absolute: 0.1 10*3/uL (ref 0.0–0.5)
Eosinophils Relative: 1 %
HCT: 26.8 % — ABNORMAL LOW (ref 36.0–46.0)
Hemoglobin: 8.3 g/dL — ABNORMAL LOW (ref 12.0–15.0)
Immature Granulocytes: 1 %
Lymphocytes Relative: 14 %
Lymphs Abs: 1 10*3/uL (ref 0.7–4.0)
MCH: 30.7 pg (ref 26.0–34.0)
MCHC: 31 g/dL (ref 30.0–36.0)
MCV: 99.3 fL (ref 80.0–100.0)
Monocytes Absolute: 0.3 10*3/uL (ref 0.1–1.0)
Monocytes Relative: 4 %
Neutro Abs: 5.8 10*3/uL (ref 1.7–7.7)
Neutrophils Relative %: 80 %
Platelet Count: 282 10*3/uL (ref 150–400)
RBC: 2.7 MIL/uL — ABNORMAL LOW (ref 3.87–5.11)
RDW: 19.1 % — ABNORMAL HIGH (ref 11.5–15.5)
WBC Count: 7.1 10*3/uL (ref 4.0–10.5)
nRBC: 0 % (ref 0.0–0.2)

## 2018-08-24 MED ORDER — OXYCODONE HCL 5 MG PO TABS: 5.0000 mg | ORAL_TABLET | ORAL | 0 refills | Status: DC | PRN

## 2018-08-24 MED ORDER — FENTANYL 25 MCG/HR TD PT72: 25.0000 ug | MEDICATED_PATCH | TRANSDERMAL | 0 refills | Status: DC

## 2018-08-24 MED ORDER — PROCHLORPERAZINE MALEATE 10 MG PO TABS
10.0000 mg | ORAL_TABLET | Freq: Once | ORAL | Status: AC
  Administered 2018-08-24: 10 mg via ORAL

## 2018-08-24 MED ORDER — BORTEZOMIB CHEMO SQ INJECTION 3.5 MG (2.5MG/ML)
1.5000 mg/m2 | Freq: Once | INTRAMUSCULAR | Status: AC
  Administered 2018-08-24: 2.75 mg via SUBCUTANEOUS
  Filled 2018-08-24: qty 1.1

## 2018-08-24 MED ORDER — PROCHLORPERAZINE MALEATE 10 MG PO TABS
ORAL_TABLET | ORAL | Status: AC
  Filled 2018-08-24: qty 1

## 2018-08-24 NOTE — Patient Instructions (Signed)

## 2018-08-31 ENCOUNTER — Inpatient Hospital Stay: Payer: Medicare Other

## 2018-08-31 VITALS — BP 134/65 | HR 83 | Temp 98.4°F | Resp 18

## 2018-08-31 DIAGNOSIS — Z5112 Encounter for antineoplastic immunotherapy: Secondary | ICD-10-CM | POA: Diagnosis not present

## 2018-08-31 DIAGNOSIS — C9000 Multiple myeloma not having achieved remission: Secondary | ICD-10-CM

## 2018-08-31 DIAGNOSIS — D472 Monoclonal gammopathy: Secondary | ICD-10-CM

## 2018-08-31 LAB — CMP (CANCER CENTER ONLY)
ALT: 20 U/L (ref 0–44)
AST: 25 U/L (ref 15–41)
Albumin: 2.7 g/dL — ABNORMAL LOW (ref 3.5–5.0)
Alkaline Phosphatase: 70 U/L (ref 38–126)
Anion gap: 4 — ABNORMAL LOW (ref 5–15)
BUN: 10 mg/dL (ref 8–23)
CO2: 24 mmol/L (ref 22–32)
Calcium: 8.4 mg/dL — ABNORMAL LOW (ref 8.9–10.3)
Chloride: 100 mmol/L (ref 98–111)
Creatinine: 0.77 mg/dL (ref 0.44–1.00)
GFR, Est AFR Am: >60 mL/min (ref >60)
GFR, Estimated: >60 mL/min (ref >60)
Glucose, Bld: 132 mg/dL — ABNORMAL HIGH (ref 70–99)
Potassium: 5 mmol/L (ref 3.5–5.1)
Sodium: 128 mmol/L — ABNORMAL LOW (ref 135–145)
Total Bilirubin: 0.4 mg/dL (ref 0.3–1.2)
Total Protein: 11.9 g/dL — ABNORMAL HIGH (ref 6.5–8.1)

## 2018-08-31 LAB — CBC WITH DIFFERENTIAL (CANCER CENTER ONLY)
Abs Immature Granulocytes: 0.03 10*3/uL (ref 0.00–0.07)
Basophils Absolute: 0 10*3/uL (ref 0.0–0.1)
Basophils Relative: 0 %
Eosinophils Absolute: 0 10*3/uL (ref 0.0–0.5)
Eosinophils Relative: 0 %
HCT: 26.9 % — ABNORMAL LOW (ref 36.0–46.0)
Hemoglobin: 8.3 g/dL — ABNORMAL LOW (ref 12.0–15.0)
Immature Granulocytes: 0 %
Lymphocytes Relative: 16 %
Lymphs Abs: 1.1 10*3/uL (ref 0.7–4.0)
MCH: 30.5 pg (ref 26.0–34.0)
MCHC: 30.9 g/dL (ref 30.0–36.0)
MCV: 98.9 fL (ref 80.0–100.0)
Monocytes Absolute: 0.2 10*3/uL (ref 0.1–1.0)
Monocytes Relative: 3 %
Neutro Abs: 5.5 10*3/uL (ref 1.7–7.7)
Neutrophils Relative %: 81 %
Platelet Count: 276 10*3/uL (ref 150–400)
RBC: 2.72 MIL/uL — ABNORMAL LOW (ref 3.87–5.11)
RDW: 19 % — ABNORMAL HIGH (ref 11.5–15.5)
WBC Count: 6.9 10*3/uL (ref 4.0–10.5)
nRBC: 0 % (ref 0.0–0.2)

## 2018-08-31 MED ORDER — BORTEZOMIB CHEMO SQ INJECTION 3.5 MG (2.5MG/ML)
1.5000 mg/m2 | Freq: Once | INTRAMUSCULAR | Status: AC
  Administered 2018-08-31: 2.75 mg via SUBCUTANEOUS
  Filled 2018-08-31: qty 1.1

## 2018-08-31 MED ORDER — PROCHLORPERAZINE MALEATE 10 MG PO TABS
10.0000 mg | ORAL_TABLET | Freq: Once | ORAL | Status: AC
  Administered 2018-08-31: 10 mg via ORAL

## 2018-08-31 MED ORDER — PROCHLORPERAZINE MALEATE 10 MG PO TABS
ORAL_TABLET | ORAL | Status: AC
  Filled 2018-08-31: qty 1

## 2018-08-31 NOTE — Patient Instructions (Signed)
[facility] Discharge Instructions for Patients Receiving Chemotherapy  Today you received the following chemotherapy agents Velcade.  To help prevent nausea and vomiting after your treatment, we encourage you to take your nausea medication as directed.  If you develop nausea and vomiting that is not controlled by your nausea medication, call the clinic.   BELOW ARE SYMPTOMS THAT SHOULD BE REPORTED IMMEDIATELY:  *FEVER GREATER THAN 100.5 F  *CHILLS WITH OR WITHOUT FEVER  NAUSEA AND VOMITING THAT IS NOT CONTROLLED WITH YOUR NAUSEA MEDICATION  *UNUSUAL SHORTNESS OF BREATH  *UNUSUAL BRUISING OR BLEEDING  TENDERNESS IN MOUTH AND THROAT WITH OR WITHOUT PRESENCE OF ULCERS  *URINARY PROBLEMS  *BOWEL PROBLEMS  UNUSUAL RASH Items with * indicate a potential emergency and should be followed up as soon as possible.  Feel free to call the clinic should you have any questions or concerns. The clinic phone number is [phone].  Please show the CHEMO ALERT CARD at check-in to the Emergency Department and triage nurse.   

## 2018-09-07 ENCOUNTER — Inpatient Hospital Stay: Payer: Medicare Other

## 2018-09-07 VITALS — BP 121/64 | HR 84 | Temp 98.4°F | Resp 16

## 2018-09-07 DIAGNOSIS — D472 Monoclonal gammopathy: Secondary | ICD-10-CM

## 2018-09-07 DIAGNOSIS — C9000 Multiple myeloma not having achieved remission: Secondary | ICD-10-CM

## 2018-09-07 DIAGNOSIS — Z5112 Encounter for antineoplastic immunotherapy: Secondary | ICD-10-CM | POA: Diagnosis not present

## 2018-09-07 LAB — CMP (CANCER CENTER ONLY)
ALT: 22 U/L (ref 0–44)
AST: 27 U/L (ref 15–41)
Albumin: 2.7 g/dL — ABNORMAL LOW (ref 3.5–5.0)
Alkaline Phosphatase: 65 U/L (ref 38–126)
Anion gap: 4 — ABNORMAL LOW (ref 5–15)
BUN: 9 mg/dL (ref 8–23)
CO2: 25 mmol/L (ref 22–32)
Calcium: 8.1 mg/dL — ABNORMAL LOW (ref 8.9–10.3)
Chloride: 102 mmol/L (ref 98–111)
Creatinine: 0.8 mg/dL (ref 0.44–1.00)
GFR, Est AFR Am: >60 mL/min (ref >60)
GFR, Estimated: >60 mL/min (ref >60)
Glucose, Bld: 154 mg/dL — ABNORMAL HIGH (ref 70–99)
Potassium: 4.4 mmol/L (ref 3.5–5.1)
Sodium: 131 mmol/L — ABNORMAL LOW (ref 135–145)
Total Bilirubin: 0.3 mg/dL (ref 0.3–1.2)
Total Protein: 11.5 g/dL — ABNORMAL HIGH (ref 6.5–8.1)

## 2018-09-07 LAB — CBC WITH DIFFERENTIAL (CANCER CENTER ONLY)
Abs Immature Granulocytes: 0.04 10*3/uL (ref 0.00–0.07)
Basophils Absolute: 0 10*3/uL (ref 0.0–0.1)
Basophils Relative: 0 %
Eosinophils Absolute: 0.1 10*3/uL (ref 0.0–0.5)
Eosinophils Relative: 2 %
HCT: 26.6 % — ABNORMAL LOW (ref 36.0–46.0)
Hemoglobin: 8.3 g/dL — ABNORMAL LOW (ref 12.0–15.0)
Immature Granulocytes: 1 %
Lymphocytes Relative: 23 %
Lymphs Abs: 1.6 10*3/uL (ref 0.7–4.0)
MCH: 30.9 pg (ref 26.0–34.0)
MCHC: 31.2 g/dL (ref 30.0–36.0)
MCV: 98.9 fL (ref 80.0–100.0)
Monocytes Absolute: 0.4 10*3/uL (ref 0.1–1.0)
Monocytes Relative: 6 %
Neutro Abs: 4.6 10*3/uL (ref 1.7–7.7)
Neutrophils Relative %: 68 %
Platelet Count: 274 10*3/uL (ref 150–400)
RBC: 2.69 MIL/uL — ABNORMAL LOW (ref 3.87–5.11)
RDW: 19.1 % — ABNORMAL HIGH (ref 11.5–15.5)
WBC Count: 6.8 10*3/uL (ref 4.0–10.5)
nRBC: 0 % (ref 0.0–0.2)

## 2018-09-07 MED ORDER — PROCHLORPERAZINE MALEATE 10 MG PO TABS
ORAL_TABLET | ORAL | Status: AC
  Filled 2018-09-07: qty 1

## 2018-09-07 MED ORDER — PROCHLORPERAZINE MALEATE 10 MG PO TABS
10.0000 mg | ORAL_TABLET | Freq: Once | ORAL | Status: AC
  Administered 2018-09-07: 10 mg via ORAL

## 2018-09-07 MED ORDER — BORTEZOMIB CHEMO SQ INJECTION 3.5 MG (2.5MG/ML)
1.5000 mg/m2 | Freq: Once | INTRAMUSCULAR | Status: AC
  Administered 2018-09-07: 2.75 mg via SUBCUTANEOUS
  Filled 2018-09-07: qty 1.1

## 2018-09-07 NOTE — Patient Instructions (Signed)
[facility] Discharge Instructions for Patients Receiving Chemotherapy  Today you received the following chemotherapy agents Velcade.  To help prevent nausea and vomiting after your treatment, we encourage you to take your nausea medication as directed.  If you develop nausea and vomiting that is not controlled by your nausea medication, call the clinic.   BELOW ARE SYMPTOMS THAT SHOULD BE REPORTED IMMEDIATELY:  *FEVER GREATER THAN 100.5 F  *CHILLS WITH OR WITHOUT FEVER  NAUSEA AND VOMITING THAT IS NOT CONTROLLED WITH YOUR NAUSEA MEDICATION  *UNUSUAL SHORTNESS OF BREATH  *UNUSUAL BRUISING OR BLEEDING  TENDERNESS IN MOUTH AND THROAT WITH OR WITHOUT PRESENCE OF ULCERS  *URINARY PROBLEMS  *BOWEL PROBLEMS  UNUSUAL RASH Items with * indicate a potential emergency and should be followed up as soon as possible.  Feel free to call the clinic should you have any questions or concerns. The clinic phone number is [phone].  Please show the CHEMO ALERT CARD at check-in to the Emergency Department and triage nurse.   

## 2018-09-15 ENCOUNTER — Inpatient Hospital Stay: Payer: Medicare Other | Admitting: Oncology

## 2018-09-15 ENCOUNTER — Inpatient Hospital Stay: Payer: Medicare Other

## 2018-09-15 ENCOUNTER — Inpatient Hospital Stay: Payer: Medicare Other | Attending: Oncology

## 2018-09-15 ENCOUNTER — Telehealth: Payer: Self-pay | Admitting: Oncology

## 2018-09-15 VITALS — BP 133/66 | HR 87 | Temp 98.4°F | Resp 18 | Ht 66.0 in | Wt 140.1 lb

## 2018-09-15 DIAGNOSIS — M810 Age-related osteoporosis without current pathological fracture: Secondary | ICD-10-CM | POA: Diagnosis not present

## 2018-09-15 DIAGNOSIS — Z791 Long term (current) use of non-steroidal anti-inflammatories (NSAID): Secondary | ICD-10-CM | POA: Insufficient documentation

## 2018-09-15 DIAGNOSIS — Z79899 Other long term (current) drug therapy: Secondary | ICD-10-CM | POA: Diagnosis not present

## 2018-09-15 DIAGNOSIS — C9000 Multiple myeloma not having achieved remission: Secondary | ICD-10-CM | POA: Insufficient documentation

## 2018-09-15 DIAGNOSIS — B029 Zoster without complications: Secondary | ICD-10-CM | POA: Insufficient documentation

## 2018-09-15 DIAGNOSIS — M549 Dorsalgia, unspecified: Secondary | ICD-10-CM | POA: Insufficient documentation

## 2018-09-15 DIAGNOSIS — D630 Anemia in neoplastic disease: Secondary | ICD-10-CM

## 2018-09-15 DIAGNOSIS — D649 Anemia, unspecified: Secondary | ICD-10-CM | POA: Diagnosis not present

## 2018-09-15 DIAGNOSIS — Z5112 Encounter for antineoplastic immunotherapy: Secondary | ICD-10-CM | POA: Diagnosis present

## 2018-09-15 DIAGNOSIS — M8000XD Age-related osteoporosis with current pathological fracture, unspecified site, subsequent encounter for fracture with routine healing: Secondary | ICD-10-CM

## 2018-09-15 DIAGNOSIS — D472 Monoclonal gammopathy: Secondary | ICD-10-CM

## 2018-09-15 LAB — CBC WITH DIFFERENTIAL (CANCER CENTER ONLY)
Abs Immature Granulocytes: 0.03 10*3/uL (ref 0.00–0.07)
Basophils Absolute: 0 10*3/uL (ref 0.0–0.1)
Basophils Relative: 1 %
Eosinophils Absolute: 0.1 10*3/uL (ref 0.0–0.5)
Eosinophils Relative: 1 %
HCT: 27.2 % — ABNORMAL LOW (ref 36.0–46.0)
Hemoglobin: 8.4 g/dL — ABNORMAL LOW (ref 12.0–15.0)
Immature Granulocytes: 1 %
Lymphocytes Relative: 16 %
Lymphs Abs: 1 10*3/uL (ref 0.7–4.0)
MCH: 30.3 pg (ref 26.0–34.0)
MCHC: 30.9 g/dL (ref 30.0–36.0)
MCV: 98.2 fL (ref 80.0–100.0)
Monocytes Absolute: 0.3 10*3/uL (ref 0.1–1.0)
Monocytes Relative: 4 %
Neutro Abs: 5.2 10*3/uL (ref 1.7–7.7)
Neutrophils Relative %: 77 %
Platelet Count: 304 10*3/uL (ref 150–400)
RBC: 2.77 MIL/uL — ABNORMAL LOW (ref 3.87–5.11)
RDW: 18.6 % — ABNORMAL HIGH (ref 11.5–15.5)
WBC Count: 6.6 10*3/uL (ref 4.0–10.5)
nRBC: 0 % (ref 0.0–0.2)

## 2018-09-15 LAB — CMP (CANCER CENTER ONLY)
ALT: 22 U/L (ref 0–44)
AST: 25 U/L (ref 15–41)
Albumin: 2.6 g/dL — ABNORMAL LOW (ref 3.5–5.0)
Alkaline Phosphatase: 63 U/L (ref 38–126)
Anion gap: 3 — ABNORMAL LOW (ref 5–15)
BUN: 7 mg/dL — ABNORMAL LOW (ref 8–23)
CO2: 24 mmol/L (ref 22–32)
Calcium: 8.2 mg/dL — ABNORMAL LOW (ref 8.9–10.3)
Chloride: 104 mmol/L (ref 98–111)
Creatinine: 0.77 mg/dL (ref 0.44–1.00)
GFR, Est AFR Am: >60 mL/min (ref >60)
GFR, Estimated: >60 mL/min (ref >60)
Glucose, Bld: 162 mg/dL — ABNORMAL HIGH (ref 70–99)
Potassium: 4.5 mmol/L (ref 3.5–5.1)
Sodium: 131 mmol/L — ABNORMAL LOW (ref 135–145)
Total Bilirubin: 0.3 mg/dL (ref 0.3–1.2)
Total Protein: 10.9 g/dL — ABNORMAL HIGH (ref 6.5–8.1)

## 2018-09-15 MED ORDER — PROCHLORPERAZINE MALEATE 10 MG PO TABS
ORAL_TABLET | ORAL | Status: AC
  Filled 2018-09-15: qty 1

## 2018-09-15 MED ORDER — ZOLEDRONIC ACID 4 MG/100ML IV SOLN
4.0000 mg | Freq: Once | INTRAVENOUS | Status: AC
  Administered 2018-09-15: 4 mg via INTRAVENOUS
  Filled 2018-09-15: qty 100

## 2018-09-15 MED ORDER — SODIUM CHLORIDE 0.9 % IV SOLN
Freq: Once | INTRAVENOUS | Status: AC
  Administered 2018-09-15: 11:00:00 via INTRAVENOUS
  Filled 2018-09-15: qty 250

## 2018-09-15 MED ORDER — PROCHLORPERAZINE MALEATE 10 MG PO TABS
10.0000 mg | ORAL_TABLET | Freq: Once | ORAL | Status: AC
  Administered 2018-09-15: 10 mg via ORAL

## 2018-09-15 MED ORDER — BORTEZOMIB CHEMO SQ INJECTION 3.5 MG (2.5MG/ML)
1.5000 mg/m2 | Freq: Once | INTRAMUSCULAR | Status: AC
  Administered 2018-09-15: 2.75 mg via SUBCUTANEOUS
  Filled 2018-09-15: qty 1.1

## 2018-09-15 MED ORDER — LENALIDOMIDE 25 MG PO CAPS: 25.0000 mg | ORAL_CAPSULE | Freq: Every day | ORAL | 0 refills | Status: DC

## 2018-09-15 NOTE — Progress Notes (Signed)
Hematology and Oncology Follow Up Visit  Nicole Keith [id number] 12/21/1940 77 y.o. 09/15/2018 9:52 AM [name], Aaron, [name], Aaron, MD   Principle Diagnosis: 77-year-old woman with multiple myeloma diagnosed in December 2019.  She was found to have IgG lambda MGUS diagnosed in 2016.    Current therapy: Velcade and dexamethasone started weekly on 07/27/2018.  She is under consideration to start different salvage therapy.  Interim History:  Nicole Keith returns today for a follow-up visit.  Since last visit, she noted slight improvement in her overall health and reports ambulating short distances and was able to be on a golf course although not able to play.  He is ambulating short distances without any falls or syncope.  She tolerated Velcade without any complications.  She denies any nausea or injection related issues.  Her performance status and quality of life remained reasonable.  She continues to have pain issues in her ribs although overall improved and currently on fentanyl patch and uses oxycodone infrequently.  Patient denied any alteration mental status, neuropathy, confusion or dizziness.  Denies any headaches or lethargy.  Denies any night sweats, weight loss or changes in appetite.  Denied orthopnea, dyspnea on exertion or chest discomfort.  Denies shortness of breath, difficulty breathing hemoptysis or cough.  Denies any abdominal distention, nausea, early satiety or dyspepsia.  Denies any hematuria, frequency, dysuria or nocturia.  Denies any skin irritation, dryness or rash.  Denies any ecchymosis or petechiae.  Denies any lymphadenopathy or clotting.  Denies any heat or cold intolerance.  Denies any anxiety or depression.  Remaining review of system is negative.     Medications: I have reviewed the patient's current medications.  Current Outpatient Medications  Medication Sig Dispense Refill  . acyclovir (ZOVIRAX) 400 MG tablet Take 1 tablet (400 mg total) by mouth 2 (two)  times daily. 90 tablet 3  . dexamethasone (DECADRON) 4 MG tablet 5 tablets weekly with chemotherapy 100 tablet 3  . diphenhydrAMINE (BENADRYL) 25 mg capsule Take 25 mg by mouth at bedtime as needed.    . famotidine (PEPCID) 20 MG tablet Take 20 mg by mouth daily.    . fentaNYL (DURAGESIC - DOSED MCG/HR) 25 MCG/HR patch Place 1 patch (25 mcg total) onto the skin every 3 (three) days. 10 patch 0  . ferrous sulfate 325 (65 FE) MG EC tablet Take 1 tablet (325 mg total) by mouth daily with breakfast. 90 tablet 3  . ibuprofen (ADVIL,MOTRIN) 200 MG tablet Take 400 mg 3 (three) times daily by mouth.     . lenalidomide (REVLIMID) 25 MG capsule Take 1 capsule (25 mg total) by mouth daily. 21 capsule 0  . loratadine (CLARITIN) 10 MG tablet Take 10 mg by mouth daily.    . oxyCODONE (OXY IR/ROXICODONE) 5 MG immediate release tablet Take 1 tablet (5 mg total) by mouth every 4 (four) hours as needed for severe pain. 20 tablet 0  . oxyCODONE (OXY IR/ROXICODONE) 5 MG immediate release tablet Take 1 tablet (5 mg total) by mouth every 4 (four) hours as needed for severe pain. 90 tablet 0  . Probiotic Product (ALIGN PO) Take by mouth.    . prochlorperazine (COMPAZINE) 10 MG tablet Take 1 tablet (10 mg total) by mouth every 6 (six) hours as needed for nausea or vomiting. 30 tablet 0   No current facility-administered medications for this visit.      Allergies:  Allergies  Allergen Reactions  . Betadine [Povidone Iodine] Rash  . Flagyl [Metronidazole]   Diarrhea  . Fosamax [Alendronate Sodium] Other (See Comments)    GI distress  . Bactrim [Sulfamethoxazole-Trimethoprim]     nausea  . Doxepin Hcl     Irritable, hallucinations  . Hydroxyzine Hcl     hallucinations  . Valium [Diazepam] Other (See Comments)  . Actonel [Risedronate Sodium] Other (See Comments)    GI distress  . Boniva [Ibandronic Acid] Other (See Comments)    GI distress  . Doxycycline Rash  . Penicillins Rash    Ancef was OK.    Past  Medical History, Surgical history, Social history, and Family History were reviewed and updated.   Physical Exam:  Blood pressure 133/66, pulse 87, temperature 98.4 F (36.9 C), temperature source Oral, resp. rate 18, height 5' 6" (1.676 m), weight 140 lb 1.6 oz (63.5 kg), SpO2 100 %.    ECOG: 1     General appearance: Comfortable appearing without any discomfort Head: Normocephalic without any trauma Oropharynx: Mucous membranes are moist and pink without any thrush or ulcers. Eyes: Pupils are equal and round reactive to light. Lymph nodes: No cervical, supraclavicular, inguinal or axillary lymphadenopathy.   Heart:regular rate and rhythm.  S1 and S2 1+ edema noted at the ankles. Lung: Clear without any rhonchi or wheezes.  No dullness to percussion. Abdomin: Soft, nontender, nondistended with good bowel sounds.  No hepatosplenomegaly. Musculoskeletal: No joint deformity or effusion.  Full range of motion noted. Neurological: No deficits noted on motor, sensory and deep tendon reflex exam. Skin: No petechial rash or dryness.  Appeared moist.    Lab Results: Lab Results  Component Value Date   WBC 6.6 09/15/2018   HGB 8.4 (L) 09/15/2018   HCT 27.2 (L) 09/15/2018   MCV 98.2 09/15/2018   PLT 304 09/15/2018     Chemistry      Component Value Date/Time   NA 131 (L) 09/15/2018 0909   NA 135 (L) 06/11/2017 0753   K 4.5 09/15/2018 0909   K 4.0 06/11/2017 0753   CL 104 09/15/2018 0909   CO2 24 09/15/2018 0909   CO2 26 06/11/2017 0753   BUN 7 (L) 09/15/2018 0909   BUN 12.8 06/11/2017 0753   CREATININE 0.77 09/15/2018 0909   CREATININE 0.8 06/11/2017 0753      Component Value Date/Time   CALCIUM 8.2 (L) 09/15/2018 0909   CALCIUM 8.8 06/11/2017 0753   ALKPHOS 63 09/15/2018 0909   ALKPHOS 72 06/11/2017 0753   AST 25 09/15/2018 0909   AST 21 06/11/2017 0753   ALT 22 09/15/2018 0909   ALT 13 06/11/2017 0753   BILITOT 0.3 09/15/2018 0909   BILITOT 0.35 06/11/2017 0753        Results for ONEKA, PARADA (MRN [id number]) as of 09/15/2018 09:51  Ref. Range 02/15/2018 11:08 07/12/2018 12:04 08/17/2018 10:33  M Protein SerPl Elph-Mcnc Latest Ref Range: Not Observed g/dL 5.3 (H) 5.9 (H) 5.6 (H)   Results for ALICYA, BENA (MRN [id number]) as of 09/15/2018 09:51  Ref. Range 02/15/2018 11:08 07/12/2018 12:04 08/17/2018 10:33  IgG (Immunoglobin G), Serum Latest Ref Range: 700 - 1,600 mg/dL 7,247 (H) 8,977 (H) 8,129 (H)    Impression and Plan:  78 year old woman with:  1.  Multiple myeloma diagnosed in December 2019.  She was found to have IgG lambda subtype.   She has been receiving Velcade and dexamethasone with less than adequate response at this time.  Her IgG level did decrease slightly although M spike has not dramatically changed despite 2  months of therapy.  Risks and benefits of continuing this approach versus switching to a different regimen was discussed today.  After discussion today, I am in favor of switching her to a Revlimid based regimen.  Complication associated with this medication include nausea, fatigue, myelosuppression and thrombosis.  She will start on 25 mg daily for 21 days out of a 28-day cycle.  She will continue on dexamethasone 20 mg weekly.  We will continue to monitor her protein studies at the time.  2.  Anemia: Anemia is related due to plasma cell disorder.  Hemoglobin remains stable at this time.  No need for transfusion  3. Osteoporosis and compression fracture: She will continue to receive Zometa on a monthly basis given her hypercalcemia and aggressive disease.  4.  Herpes zoster reactivation: She remains on acyclovir at this time.  5.  Back pain: Manageable at this time with fentanyl patch and hydrocodone.  He remains ambulatory that any worsening bony fractures.  6.  Antiemetics: No issues reported with nausea and vomiting.  7. Follow-up: In 3 to 4 weeks to follow her progress.  25  minutes was spent with the patient  face-to-face today.  More than 50% of time was dedicated to reviewing laboratory data, updating her disease status and discussing treatment options.   Zola Button, MD 2/5/20209:52 AM

## 2018-09-15 NOTE — Telephone Encounter (Signed)
Scheduled appt oer 02/05 los.  Printed calendar and avs.

## 2018-09-15 NOTE — Patient Instructions (Signed)
[facility] Discharge Instructions for Patients Receiving Chemotherapy  Today you received the following chemotherapy agents Velcade  To help prevent nausea and vomiting after your treatment, we encourage you to take your nausea medication as directed   If you develop nausea and vomiting that is not controlled by your nausea medication, call the clinic.   BELOW ARE SYMPTOMS THAT SHOULD BE REPORTED IMMEDIATELY:  *FEVER GREATER THAN 100.5 F  *CHILLS WITH OR WITHOUT FEVER  NAUSEA AND VOMITING THAT IS NOT CONTROLLED WITH YOUR NAUSEA MEDICATION  *UNUSUAL SHORTNESS OF BREATH  *UNUSUAL BRUISING OR BLEEDING  TENDERNESS IN MOUTH AND THROAT WITH OR WITHOUT PRESENCE OF ULCERS  *URINARY PROBLEMS  *BOWEL PROBLEMS  UNUSUAL RASH Items with * indicate a potential emergency and should be followed up as soon as possible.  Feel free to call the clinic should you have any questions or concerns. The clinic phone number is [phone].  Please show the [facility] at check-in to the Emergency Department and triage nurse.  Zoledronic Acid injection (Hypercalcemia, Oncology) What is this medicine? ZOLEDRONIC ACID (ZOE le dron ik AS id) lowers the amount of calcium loss from bone. It is used to treat too much calcium in your blood from cancer. It is also used to prevent complications of cancer that has spread to the bone. This medicine may be used for other purposes; ask your health care provider or pharmacist if you have questions. COMMON BRAND NAME(S): Zometa What should I tell my health care provider before I take this medicine? They need to know if you have any of these conditions: -aspirin-sensitive asthma -cancer, especially if you are receiving medicines used to treat cancer -dental disease or wear dentures -infection -kidney disease -receiving corticosteroids like dexamethasone or prednisone -an unusual or allergic reaction to zoledronic acid, other medicines,  foods, dyes, or preservatives -pregnant or trying to get pregnant -breast-feeding How should I use this medicine? This medicine is for infusion into a vein. It is given by a health care professional in a hospital or clinic setting. Talk to your pediatrician regarding the use of this medicine in children. Special care may be needed. Overdosage: If you think you have taken too much of this medicine contact a poison control center or emergency room at once. NOTE: This medicine is only for you. Do not share this medicine with others. What if I miss a dose? It is important not to miss your dose. Call your doctor or health care professional if you are unable to keep an appointment. What may interact with this medicine? -certain antibiotics given by injection -NSAIDs, medicines for pain and inflammation, like ibuprofen or naproxen -some diuretics like bumetanide, furosemide -teriparatide -thalidomide This list may not describe all possible interactions. Give your health care provider a list of all the medicines, herbs, non-prescription drugs, or dietary supplements you use. Also tell them if you smoke, drink alcohol, or use illegal drugs. Some items may interact with your medicine. What should I watch for while using this medicine? Visit your doctor or health care professional for regular checkups. It may be some time before you see the benefit from this medicine. Do not stop taking your medicine unless your doctor tells you to. Your doctor may order blood tests or other tests to see how you are doing. Women should inform their doctor if they wish to become pregnant or think they might be pregnant. There is a potential for serious side effects to an unborn child. Talk to  your health care professional or pharmacist for more information. You should make sure that you get enough calcium and vitamin D while you are taking this medicine. Discuss the foods you eat and the vitamins you take with your health  care professional. Some people who take this medicine have severe bone, joint, and/or muscle pain. This medicine may also increase your risk for jaw problems or a broken thigh bone. Tell your doctor right away if you have severe pain in your jaw, bones, joints, or muscles. Tell your doctor if you have any pain that does not go away or that gets worse. Tell your dentist and dental surgeon that you are taking this medicine. You should not have major dental surgery while on this medicine. See your dentist to have a dental exam and fix any dental problems before starting this medicine. Take good care of your teeth while on this medicine. Make sure you see your dentist for regular follow-up appointments. What side effects may I notice from receiving this medicine? Side effects that you should report to your doctor or health care professional as soon as possible: -allergic reactions like skin rash, itching or hives, swelling of the face, lips, or tongue -anxiety, confusion, or depression -breathing problems -changes in vision -eye pain -feeling faint or lightheaded, falls -jaw pain, especially after dental work -mouth sores -muscle cramps, stiffness, or weakness -redness, blistering, peeling or loosening of the skin, including inside the mouth -trouble passing urine or change in the amount of urine Side effects that usually do not require medical attention (report to your doctor or health care professional if they continue or are bothersome): -bone, joint, or muscle pain -constipation -diarrhea -fever -hair loss -irritation at site where injected -loss of appetite -nausea, vomiting -stomach upset -trouble sleeping -trouble swallowing -weak or tired This list may not describe all possible side effects. Call your doctor for medical advice about side effects. You may report side effects to FDA at 1-800-FDA-1088. Where should I keep my medicine? This drug is given in a hospital or clinic and  will not be stored at home. NOTE: This sheet is a summary. It may not cover all possible information. If you have questions about this medicine, talk to your doctor, pharmacist, or health care provider.  2019 Elsevier/Gold Standard (2013-12-24 14:19:39)

## 2018-09-16 ENCOUNTER — Telehealth: Payer: Self-pay | Admitting: Pharmacist

## 2018-09-16 DIAGNOSIS — C9000 Multiple myeloma not having achieved remission: Secondary | ICD-10-CM

## 2018-09-16 NOTE — Telephone Encounter (Signed)
Oral Oncology Pharmacist Encounter  Received new prescription for Revlimid (lenalidomide) for the treatment of multiple myeloma not having achieved remission in conjunction with Revlimid, planned duration until disease progression or unacceptable toxicity.  Labs from 09/15/2018 assessed, OK for treatment. SCr=0.77, round to 1.0 for age > 39, est CrCl ~ 45 mL/min  Revlimid has been prescribed at 25 mg once daily for 21 days on, 7 days off, repeat every 28 days Manufacturer recommends initial reduced dosage of Revlimid for CrCl 30-60 mL/min to 10 mg once daily with a possible increase to 15 mg once daily after 2 cycles if patient is tolerating treatment well We will discuss dose adjustment with MD  Current medication list in Epic reviewed, no DDIs with Revlimid identified.  Did not note an agent for thromboprophylaxis on patient's medication list. We will discuss addition of aspirin 81 mg once daily with MD, and alert patient to start this agent as soon as possible if approved by MD.  Revlimid prescription will be E scribed to appropriate specialty pharmacy for dispensing once insurance authorization is approved. Revlimid is a limited distribution medication and is not available for dispensing from the [facility] long outpatient pharmacy.  Oral Oncology Clinic will continue to follow for insurance authorization, copayment issues, initial counseling and start date.  [name], PharmD, BCPS, BCOP  09/16/2018 11:56 AM Oral Oncology Clinic [phone]

## 2018-09-17 ENCOUNTER — Telehealth: Payer: Self-pay | Admitting: Pharmacist

## 2018-09-17 NOTE — Telephone Encounter (Signed)
Oral Oncology Pharmacist Encounter  Discussed Revlimid dosing with MD. Revlimid dose will be decreased to 15 mg once daily for 21 days on, 7 days off, repeat every 21 days, for cycle 1. Dose to be increased in the future based on toleration and CBC.  Discussed above with Visual merchandiser. She will obtain new [employer] authorization number for dose change of prescription and alert this pharmacist when new [employer] authorization number is obtained.  Will inform patient to start on aspirin 81 mg once daily when I speak to her for initial counseling.  [name], PharmD, BCPS, BCOP  09/17/2018 8:53 AM Oral Oncology Clinic [phone]

## 2018-09-17 NOTE — Telephone Encounter (Signed)
Oral Chemotherapy Pharmacist Encounter   I spoke with patient for overview of: Revlimid (lenalidomide) for the treatment of multiple myeloma not having achieved remission in conjunction with Revlimid, planned duration until disease progression or unacceptable toxicity.   Counseled patient on administration, dosing, side effects, monitoring, drug-food interactions, safe handling, storage, and disposal.  Patient will take Revlimid 50m capsules, 1 capsule by mouth once daily, without regard to food, with a full glass of water.  Revlimid will be given 21 days on, 7 days off, repeat every 28 days.  Patient will take dexamethasone 411mtablets, 5 tablets (2022mby mouth once weekly with breakfast.  Revlimid start date: TBD, pending medication acquisition  Adverse effects of Revlimid include but are not limited to: nausea, constipation, diarrhea, abdominal pain, rash, fatigue, drug fever, and decreased blood counts.    Reviewed with patient importance of keeping a medication schedule and plan for any missed doses.  Mrs. Kincannon voiced understanding and appreciation.   All questions answered. Medication reconciliation performed and medication/allergy list updated.  Patient remains on acyclovir. Patient counseled on importance of daily aspirin 28m3mr VTE prophylaxis and will start on day 1 of Revlimid.  [place] [facility] [place] [facility] once I am able to secure copayment grant to cover out-of-pocket expenses for Revlimid.  Patient knows to call the office with questions or concerns. Oral Oncology Clinic will continue to follow.  JessJohny DrillingarmD, BCPS, BCOP  09/17/2018    4:23 PM Oral Oncology Clinic [phone]

## 2018-09-17 NOTE — Telephone Encounter (Signed)
Oral Oncology Pharmacist Encounter  Insurance authorization for Revlimid has been submitted to [employer] through [email or web address]  Key: [id number] Status is pending  This encounter will continue to be updated until final determination.  [name], PharmD, BCPS, BCOP  09/17/2018 9:02 AM Oral Oncology Clinic [phone]

## 2018-09-20 ENCOUNTER — Telehealth: Payer: Self-pay

## 2018-09-20 MED ORDER — LENALIDOMIDE 15 MG PO CAPS: 15.0000 mg | ORAL_CAPSULE | Freq: Every day | ORAL | 0 refills | Status: DC

## 2018-09-20 NOTE — Telephone Encounter (Signed)
Oral Oncology Pharmacist Encounter  Revlimid e-scribed to [facility] in [place], [place]. Faxed supporting information including demographics, insurance information, prior authorization, and grant information to [employer] at [phone]. Pharmacy contact at [facility] has been emailed to alert of incoming prescription.  [name], PharmD, BCPS, BCOP  09/20/2018 8:44 AM Oral Oncology Clinic [phone]

## 2018-09-20 NOTE — Telephone Encounter (Addendum)
Oral Oncology Patient Advocate Encounter  I was successful at securing a grant with [facility] for $10,000. This will keep the out of pocket expense for Revlimid at $0. The grant information is as follows and will be shared with dispensing pharmacy.  Approval dates: 08/21/18-08/21/19 ID: [id number] Group: [id number] BIN: [id number] PCN: [id number]  I called the patient to give her the good news and had to leave a [facility] Patient [place] Phone [phone] Fax [phone]

## 2018-09-20 NOTE — Telephone Encounter (Signed)
Oral Oncology Pharmacist Encounter  Insurance authorization for Revlimid has been approved by [employer]. [id number] Effective dates: 09/17/2018-08/11/2019  [name], PharmD, BCPS, BCOP  09/20/2018 8:06 AM Oral Oncology Clinic [phone]

## 2018-09-21 NOTE — Addendum Note (Signed)
Addended by: Jolayne Haines on: 09/21/2018 09:07 AM   Modules accepted: Orders

## 2018-09-23 MED ORDER — LENALIDOMIDE 15 MG PO CAPS: 15.0000 mg | ORAL_CAPSULE | Freq: Every day | ORAL | 0 refills | Status: DC

## 2018-09-23 NOTE — Telephone Encounter (Signed)
Oral Oncology Pharmacist Encounter  Received notification from [facility] that they are unable to ship Revlimid prescriptions to a PO Box since signature is required by the manufacturer guidelines for delivery of Revlimid. Discussed with patient, she does not want medication to be delivered to her home.  Revlimid prescription has now been E scribed to [facility] in [place], [place].  I have also faxed supporting information such as demographics, insurance card, insurance authorization, medication list, and health well [name] information to dispensing pharmacy at [phone].  I will follow-up with dispensing pharmacy later today to ensure that they have prescription and will be contacting patient in a timely manner.  I discussed with patient again administration of her Revlimid, dexamethasone, acyclovir, and aspirin. Patient informed that she should be receiving [facility] to schedule [facility] of her first fill of the Revlimid by early next week. Patient informed that [facility] her [facility] a local [facility] for pickup so that it does not have to be delivered to her house.  All questions answered. Patient request to be transferred to Dr. Hazeline Junker collaborative practice RN so that she can request refills on her pain medication prescriptions.  Patient knows to call the office with any additional questions or concerns.  [name], PharmD, BCPS, BCOP  09/23/2018 10:50 AM Oral Oncology Clinic [phone]

## 2018-09-23 NOTE — Telephone Encounter (Signed)
Oral Oncology Pharmacist Encounter  I called [facility] ([facility] located in [place], [place], to follow-up on the status of patient's Revlimid prescription. Prescription has been processed and they have already contacted patient to schedule delivery. They are using [name] information that was faxed over earlier today to cover any out-of-pocket cost for patient's Revlimid. Representative stated that patient will call dispensing pharmacy back later this evening or tomorrow to instruct pharmacy which local [facility] she would like her Revlimid to be shipped to.  I called patient and verified above information. She states she plans to call [facility] back tomorrow morning with local [facility] location.  She plans to start her Revlimid and dexamethasone on 09/28/2018  All questions answered. Patient expressed understanding and is in agreement with above plan. She knows to call the office with any additional questions or concerns.  [name], PharmD, BCPS, BCOP  09/23/2018 3:08 PM Oral Oncology Clinic [phone]

## 2018-09-27 ENCOUNTER — Telehealth: Payer: Self-pay

## 2018-09-27 NOTE — Telephone Encounter (Signed)
Patient left message that she received the Revlemid today and will start taking tomorrow along with the dexamethasone, acyclovir, and ASA.

## 2018-09-27 NOTE — Telephone Encounter (Signed)
Oral Oncology Patient Advocate Encounter  I followed up with [facility] on the status of her Revlimid prescription, it is currently at the local [facility] waiting for the patient to pick up.  [facility] Patient [place] Phone [phone] Fax [phone]

## 2018-09-28 ENCOUNTER — Encounter: Payer: Self-pay | Admitting: Oncology

## 2018-09-28 ENCOUNTER — Other Ambulatory Visit: Payer: Self-pay | Admitting: Oncology

## 2018-09-28 MED ORDER — OXYCODONE HCL 5 MG PO TABS: 5.0000 mg | ORAL_TABLET | ORAL | 0 refills | Status: DC | PRN

## 2018-09-28 MED ORDER — FENTANYL 25 MCG/HR TD PT72: 1.0000 | MEDICATED_PATCH | TRANSDERMAL | 0 refills | Status: DC

## 2018-09-28 NOTE — Progress Notes (Signed)
Patient reached out referred by pharmacy for possible copay assistance.  Reviewed my notes and patient previously declined assistance. She states at the time she didn't need it. Advised I would review assistance and give her a call back to advise if anything is available and what is needed from her. She verbalized understanding.  LLS and PAF both have assistance available for her diagnosis and annual gross household income is needed. Called patient and left voicemail on what is needed as she gave me permission to do so.  Left my contact name and number for any additional questions or concerns and she could leave the information on my confidential voicemail.

## 2018-09-28 NOTE — Progress Notes (Signed)
Patient returned my call with information to apply for copay assistance. Based on verbal amount, she was over the income for PAF. Patient will call me back when she has exact income for the most recent year to apply.   She has my name and number for any additional financial questions or concerns.

## 2018-09-28 NOTE — Telephone Encounter (Signed)
Oral Oncology Pharmacist Encounter  Received call from patient with a few additional questions about prescribed medication regimen. She states she started her dexamethasone once weekly this morning. She plans to take her first dose of Revlimid 15 mg capsules this evening before bed with a glass of water. She states she also continues on her acyclovir daily and will add aspirin with her Revlimid dose this evening.  Patient states that she is also in need of refills for her acyclovir, dexamethasone, and fentanyl patches. She states she would like these medications to be refilled at her local [facility] on [address] in [place].  I transferred patient to Dr. Hazeline Junker collaborative practice RN for refills on these medications.  Patient also with questions about a bill that she had received from [facility] and whether her foundation copayment grant secured for Revlimid would be able to cover these cost. Patient instructed to save that grant for all of her cost of Revlimid so that it does not become depleted too quickly. Provided phone number to Stefanie Libel, financial advocate, to help patient understand her bill from [facility] and also discuss alternate grants that may be available to cover expenses of the patient from the clinic.  Prior to transfer all questions answered. Patient knows to call the office with any additional questions or concerns.  [name], PharmD, BCPS, BCOP  09/28/2018 2:24 PM Oral Oncology Clinic [phone]

## 2018-10-05 ENCOUNTER — Encounter: Payer: Self-pay | Admitting: Oncology

## 2018-10-05 NOTE — Progress Notes (Signed)
Patient called me back yesterday with specific income amount to apply for other copay assistance.  Applied for copay assistance via [employer] online. Obtained physician signature. Uploaded to portal and faxed to [employer]. Both were successful.  Will check the portal to monitor status.

## 2018-10-07 ENCOUNTER — Encounter: Payer: Self-pay | Admitting: Oncology

## 2018-10-07 NOTE — Progress Notes (Signed)
Patient approved for copay assistance through [employer] for up to $11,000 to assist with insurance premiums and qualifying expenses.  Copy of award letter will be mailed to patient for her records only. Notifying her today of approval.  Copy of award letter and POE given to [facility] for billing/copay monitoring.

## 2018-10-20 ENCOUNTER — Inpatient Hospital Stay: Payer: Medicare Other

## 2018-10-20 ENCOUNTER — Other Ambulatory Visit: Payer: Self-pay

## 2018-10-20 ENCOUNTER — Inpatient Hospital Stay: Payer: Medicare Other | Attending: Oncology

## 2018-10-20 ENCOUNTER — Telehealth: Payer: Self-pay | Admitting: Oncology

## 2018-10-20 ENCOUNTER — Other Ambulatory Visit: Payer: Self-pay | Admitting: *Deleted

## 2018-10-20 ENCOUNTER — Inpatient Hospital Stay: Payer: Medicare Other | Admitting: Oncology

## 2018-10-20 VITALS — BP 136/64 | HR 66 | Temp 98.1°F | Resp 17 | Ht 66.0 in | Wt 132.6 lb

## 2018-10-20 DIAGNOSIS — B029 Zoster without complications: Secondary | ICD-10-CM

## 2018-10-20 DIAGNOSIS — D472 Monoclonal gammopathy: Secondary | ICD-10-CM

## 2018-10-20 DIAGNOSIS — M810 Age-related osteoporosis without current pathological fracture: Secondary | ICD-10-CM | POA: Insufficient documentation

## 2018-10-20 DIAGNOSIS — C9000 Multiple myeloma not having achieved remission: Secondary | ICD-10-CM

## 2018-10-20 DIAGNOSIS — D649 Anemia, unspecified: Secondary | ICD-10-CM | POA: Diagnosis not present

## 2018-10-20 DIAGNOSIS — M8000XD Age-related osteoporosis with current pathological fracture, unspecified site, subsequent encounter for fracture with routine healing: Secondary | ICD-10-CM

## 2018-10-20 LAB — CBC WITH DIFFERENTIAL (CANCER CENTER ONLY)
Abs Immature Granulocytes: 0.04 10*3/uL (ref 0.00–0.07)
Basophils Absolute: 0 10*3/uL (ref 0.0–0.1)
Basophils Relative: 0 %
Eosinophils Absolute: 0.1 10*3/uL (ref 0.0–0.5)
Eosinophils Relative: 1 %
HCT: 30.2 % — ABNORMAL LOW (ref 36.0–46.0)
Hemoglobin: 9.5 g/dL — ABNORMAL LOW (ref 12.0–15.0)
Immature Granulocytes: 0 %
Lymphocytes Relative: 17 %
Lymphs Abs: 1.5 10*3/uL (ref 0.7–4.0)
MCH: 30.5 pg (ref 26.0–34.0)
MCHC: 31.5 g/dL (ref 30.0–36.0)
MCV: 97.1 fL (ref 80.0–100.0)
Monocytes Absolute: 1 10*3/uL (ref 0.1–1.0)
Monocytes Relative: 11 %
Neutro Abs: 6.3 10*3/uL (ref 1.7–7.7)
Neutrophils Relative %: 71 %
Platelet Count: 285 10*3/uL (ref 150–400)
RBC: 3.11 MIL/uL — ABNORMAL LOW (ref 3.87–5.11)
RDW: 16.4 % — ABNORMAL HIGH (ref 11.5–15.5)
WBC Count: 9 10*3/uL (ref 4.0–10.5)
nRBC: 0 % (ref 0.0–0.2)

## 2018-10-20 LAB — CMP (CANCER CENTER ONLY)
ALT: 21 U/L (ref 0–44)
AST: 21 U/L (ref 15–41)
Albumin: 2.8 g/dL — ABNORMAL LOW (ref 3.5–5.0)
Alkaline Phosphatase: 54 U/L (ref 38–126)
Anion gap: 6 (ref 5–15)
BUN: 17 mg/dL (ref 8–23)
CO2: 24 mmol/L (ref 22–32)
Calcium: 7.8 mg/dL — ABNORMAL LOW (ref 8.9–10.3)
Chloride: 102 mmol/L (ref 98–111)
Creatinine: 0.76 mg/dL (ref 0.44–1.00)
GFR, Est AFR Am: >60 mL/min (ref >60)
GFR, Estimated: >60 mL/min (ref >60)
Glucose, Bld: 119 mg/dL — ABNORMAL HIGH (ref 70–99)
Potassium: 3.8 mmol/L (ref 3.5–5.1)
Sodium: 132 mmol/L — ABNORMAL LOW (ref 135–145)
Total Bilirubin: 0.4 mg/dL (ref 0.3–1.2)
Total Protein: 10 g/dL — ABNORMAL HIGH (ref 6.5–8.1)

## 2018-10-20 MED ORDER — SODIUM CHLORIDE 0.9 % IV SOLN
Freq: Once | INTRAVENOUS | Status: AC
  Administered 2018-10-20: 12:00:00 via INTRAVENOUS
  Filled 2018-10-20: qty 250

## 2018-10-20 MED ORDER — ZOLEDRONIC ACID 4 MG/100ML IV SOLN
4.0000 mg | Freq: Once | INTRAVENOUS | Status: AC
  Administered 2018-10-20: 4 mg via INTRAVENOUS
  Filled 2018-10-20: qty 100

## 2018-10-20 MED ORDER — LENALIDOMIDE 15 MG PO CAPS: 15.0000 mg | ORAL_CAPSULE | Freq: Every day | ORAL | 0 refills | Status: DC

## 2018-10-20 NOTE — Patient Instructions (Signed)

## 2018-10-20 NOTE — Telephone Encounter (Signed)
Gave avs and calendar ° °

## 2018-10-20 NOTE — Progress Notes (Signed)
Hematology and Oncology Follow Up Visit  Nicole Keith [id number] Jul 01, 1941 78 y.o. 10/20/2018 11:30 AM Nicole Keith, Nicole Keith, Nicole Keith, Nicole Keith   Principle Diagnosis: 78 year old woman with IgG lambda multiple myeloma diagnosed in December 2019.  She presented with plasma cell disorder in 2016 that evolved into multiple myeloma.  Past therapy:  Velcade and dexamethasone started weekly on 07/27/2018.  Therapy changed in February 2020.  Current therapy: Revlimid 25 mg for 21 days out of a 28-day cycle with dexamethasone started in February 2020.  Interim History:  Nicole Keith is here for a follow-up visit.  Since the last visit, she started Revlimid and completed the first cycle of therapy without any complaints.  She denies any nausea, fatigue or excessive tiredness.  She denies any worsening neuropathy or constipation.  Her performance status is slightly improving and overall quality of life continues to improve.  She still ambulating with the help of a cane without any falls or syncope.  She denies any worsening pain at this time and has not required any breakthrough pain medication.   Patient denied headaches, blurry vision, syncope or seizures.  Denies any fevers, chills or sweats.  Denied chest pain, palpitation, orthopnea or leg edema.  Denied cough, wheezing or hemoptysis.  Denied nausea, vomiting or abdominal pain.  Denies any constipation or diarrhea.  Denies any frequency urgency or hesitancy.  Denies any arthralgias or myalgias.  Denies any skin rashes or lesions.  Denies any bleeding or clotting tendency.  Denies any easy bruising.  Denies any hair or nail changes.  Denies any anxiety or depression.  Remaining review of system is negative.       Medications: I have reviewed the patient's current medications.  Current Outpatient Medications  Medication Sig Dispense Refill  . acetaminophen (TYLENOL) 500 MG tablet Take 500 mg by mouth every 6 (six) hours as needed for moderate pain.     [name] Kitchen acyclovir (ZOVIRAX) 400 MG tablet Take 1 tablet (400 mg total) by mouth 2 (two) times daily. 90 tablet 3  . clobetasol cream (TEMOVATE) 1.95 % Apply 1 application topically as needed (for itching).    [name] Kitchen dexamethasone (DECADRON) 4 MG tablet 5 tablets weekly with chemotherapy 100 tablet 3  . fentaNYL (DURAGESIC) 25 MCG/HR Place 1 patch onto the skin every 3 (three) days. 10 patch 0  . lenalidomide (REVLIMID) 15 MG capsule Take 1 capsule (15 mg total) by mouth daily. Take for 21 days on, 7 days off. Repeat every 28 days 21 capsule 0  . oxyCODONE (OXY IR/ROXICODONE) 5 MG immediate release tablet Take 1 tablet (5 mg total) by mouth every 4 (four) hours as needed for severe pain. 90 tablet 0  . sodium chloride (OCEAN) 0.65 % SOLN nasal spray Place 1 spray into both nostrils as needed for congestion.    . triamcinolone cream (KENALOG) 0.5 % Apply 1 application topically as needed (for itching).     No current facility-administered medications for this visit.      Allergies:  Allergies  Allergen Reactions  . Betadine [Povidone Iodine] Rash  . Flagyl [Metronidazole] Diarrhea  . Fosamax [Alendronate Sodium] Other (See Comments)    GI distress  . Bactrim [Sulfamethoxazole-Trimethoprim]     nausea  . Doxepin Hcl     Irritable, hallucinations  . Hydroxyzine Hcl     hallucinations  . Valium [Diazepam] Other (See Comments)  . Actonel [Risedronate Sodium] Other (See Comments)    GI distress  . Boniva [Ibandronic Acid] Other (See Comments)  GI distress  . Doxycycline Rash  . Penicillins Rash    Ancef was OK.    Past Medical History, Surgical history, Social history, and Family History were reviewed and updated.   Physical Exam:  Blood pressure 136/64, pulse 66, temperature 98.1 F (36.7 C), temperature source Oral, resp. rate 17, height '5\' 6"'  (1.676 m), weight 132 lb 9.6 oz (60.1 kg), SpO2 99 %.     ECOG: 1     General appearance: Alert, awake without any distress. Head:  Atraumatic without abnormalities Oropharynx: Without any thrush or ulcers. Eyes: No scleral icterus. Lymph nodes: No lymphadenopathy noted in the cervical, supraclavicular, or axillary nodes Heart:regular rate and rhythm, without any murmurs or gallops.   Lung: Clear to auscultation without any rhonchi, wheezes or dullness to percussion. Abdomin: Soft, nontender without any shifting dullness or ascites. Musculoskeletal: No clubbing or cyanosis. Neurological: No motor or sensory deficits. Skin: No rashes or lesions. Psychiatric: Mood and affect appeared normal.     Lab Results: Lab Results  Component Value Date   WBC 6.6 09/15/2018   HGB 8.4 (L) 09/15/2018   HCT 27.2 (L) 09/15/2018   MCV 98.2 09/15/2018   PLT 304 09/15/2018     Chemistry      Component Value Date/Time   NA 131 (L) 09/15/2018 0909   NA 135 (L) 06/11/2017 0753   K 4.5 09/15/2018 0909   K 4.0 06/11/2017 0753   CL 104 09/15/2018 0909   CO2 24 09/15/2018 0909   CO2 26 06/11/2017 0753   BUN 7 (L) 09/15/2018 0909   BUN 12.8 06/11/2017 0753   CREATININE 0.77 09/15/2018 0909   CREATININE 0.8 06/11/2017 0753      Component Value Date/Time   CALCIUM 8.2 (L) 09/15/2018 0909   CALCIUM 8.8 06/11/2017 0753   ALKPHOS 63 09/15/2018 0909   ALKPHOS 72 06/11/2017 0753   AST 25 09/15/2018 0909   AST 21 06/11/2017 0753   ALT 22 09/15/2018 0909   ALT 13 06/11/2017 0753   BILITOT 0.3 09/15/2018 0909   BILITOT 0.35 06/11/2017 0753      Results for Nicole Keith (MRN [id number]) as of 10/20/2018 11:32  Ref. Range 11/11/2017 09:48 02/15/2018 11:08 07/12/2018 12:04 08/17/2018 10:33  IgG (Immunoglobin G), Serum Latest Ref Range: 700 - 1,600 mg/dL 7,575 (H) 7,247 (H) 8,977 (H) 8,129 (H)      Impression and Plan:  78 year old woman with:  1.  IgG lambda multiple myeloma multiple myeloma diagnosed in December 2019.    She was treated with Velcade and dexamethasone without any adequate response.  She was switched to  Revlimid and dexamethasone started in February 2020.  She has tolerated the first cycle without any complications.  Risks and benefits of continuing this treatment was discussed today and she is agreeable to continue.  We will continue to monitor her protein studies on a monthly basis.  2.  Anemia: Hemoglobin improved at this time.  Her anemia is related to plasma cell disorder without any need for transfusion.  3. Osteoporosis and compression fracture: She will continue to receive Zometa on a ongoing basis.  She will receive it today and will be repeated every 3 months moving forward.  4.  Herpes zoster reactivation: No reactivation noted at this time.  I recommended continuing acyclovir.  5.  Back pain: Manageable at this time and has not required any breakthrough pain medication.  I have recommended titrating down her pain need and stopping her fentanyl patch to see  whether she needs it moving forward.  Recommended using oxycodone only for a period of time to assess whether she needs to continue fentanyl patch.  I also gave her clear instructions to resume the patch if she is requiring oxycodone multiple times a day.  6.  Antiemetics: No nausea or vomiting reported at this time.  Demadex are available to her.  7. Follow-up: In 4 weeks to assess her tolerance of the second cycle of Revlimid.  25  minutes was spent with the patient face-to-face today.  More than 50% of time was spent on discussing the natural course of her disease, treatment options, dealing with complications related to her cancer and cancer therapy.   Zola Button, Nicole Keith 3/11/202011:30 AM

## 2018-10-20 NOTE — Progress Notes (Signed)
10/20/18  Proceed with Zometa today with corrected calcium 8.76   V.Jenetta Downer Dr Creta Levin, PharmD

## 2018-10-21 ENCOUNTER — Telehealth: Payer: Self-pay | Admitting: *Deleted

## 2018-10-21 LAB — MULTIPLE MYELOMA PANEL, SERUM
Albumin SerPl Elph-Mcnc: 3.4 g/dL (ref 2.9–4.4)
Albumin/Glob SerPl: 0.6 — ABNORMAL LOW (ref 0.7–1.7)
Alpha 1: 0.3 g/dL (ref 0.0–0.4)
Alpha2 Glob SerPl Elph-Mcnc: 0.8 g/dL (ref 0.4–1.0)
B-Globulin SerPl Elph-Mcnc: 0.7 g/dL (ref 0.7–1.3)
Gamma Glob SerPl Elph-Mcnc: 4 g/dL — ABNORMAL HIGH (ref 0.4–1.8)
Globulin, Total: 5.8 g/dL — ABNORMAL HIGH (ref 2.2–3.9)
IgA: 26 mg/dL — ABNORMAL LOW (ref 64–422)
IgG (Immunoglobin G), Serum: 5751 mg/dL — ABNORMAL HIGH (ref 700–1600)
IgM (Immunoglobulin M), Srm: 33 mg/dL (ref 26–217)
M Protein SerPl Elph-Mcnc: 3.9 g/dL — ABNORMAL HIGH
Total Protein ELP: 9.2 g/dL — ABNORMAL HIGH (ref 6.0–8.5)

## 2018-10-21 LAB — KAPPA/LAMBDA LIGHT CHAINS
Kappa free light chain: 9.1 mg/L (ref 3.3–19.4)
Kappa, lambda light chain ratio: 0.24 — ABNORMAL LOW (ref 0.26–1.65)
Lambda free light chains: 37.6 mg/L — ABNORMAL HIGH (ref 5.7–26.3)

## 2018-10-21 NOTE — Telephone Encounter (Signed)
Spoke with patient. Her revlimid script is always sent to [facility] in [place] and they fill it and [facility] [address] [facility] p[facility]u.

## 2018-10-26 ENCOUNTER — Other Ambulatory Visit: Payer: Self-pay | Admitting: Oncology

## 2018-10-26 MED ORDER — FENTANYL 25 MCG/HR TD PT72: 1.0000 | MEDICATED_PATCH | TRANSDERMAL | 0 refills | Status: DC

## 2018-11-01 ENCOUNTER — Encounter: Payer: Self-pay | Admitting: Oncology

## 2018-11-01 NOTE — Progress Notes (Signed)
Patient called about approval letter received from [facility].  Advised her this was for Revlimid and was applied for by [place] in the pharamcy and this is a copy for her records only and she does not need to do anything. She verbalized understanding.  She has my contact name and number for any additional financial questions or concerns.

## 2018-11-04 ENCOUNTER — Telehealth: Payer: Self-pay | Admitting: *Deleted

## 2018-11-04 NOTE — Telephone Encounter (Signed)
Per dr Alen Blew, rash is d/t revlimid. She is to apply hydrocortisone cream twice daily and take benadryl for itching.

## 2018-11-04 NOTE — Telephone Encounter (Signed)
This is related to Revlimid. She needs to apply hydrocortisone cream (over the counter) twice a day and use benadryl for itching.

## 2018-11-04 NOTE — Telephone Encounter (Signed)
Patient calling to say she has a red, itchy, measles type rash all over her body. She is in her first week of second 21 day cycle of revlimid. States she had some itching with the first round, but not as bad, now it is all over. Denies fever, c/o mental lag in the afternoon, her mind is not as sharpe. Her next appt with dr Alen Blew is 11/10/2018

## 2018-11-05 ENCOUNTER — Telehealth: Payer: Self-pay | Admitting: *Deleted

## 2018-11-05 NOTE — Telephone Encounter (Signed)
spoke with patient. She is to report fever over 100.0, take imodium for diarrhea, compazine for nausea. Patient verbalized understanding.

## 2018-11-05 NOTE — Telephone Encounter (Signed)
Patient and husband roger calling. States patient's rash has eased off, but she is running a fever of 99.3, nauseated, took compazine, and has diarrhea. Denies cough.

## 2018-11-05 NOTE — Telephone Encounter (Signed)
Nothing to do at this point. Just watch her temperature and report over 100.0.

## 2018-11-10 ENCOUNTER — Inpatient Hospital Stay: Payer: Medicare Other | Attending: Oncology | Admitting: Oncology

## 2018-11-10 ENCOUNTER — Telehealth: Payer: Self-pay

## 2018-11-10 ENCOUNTER — Telehealth: Payer: Self-pay | Admitting: Oncology

## 2018-11-10 ENCOUNTER — Inpatient Hospital Stay: Payer: Medicare Other

## 2018-11-10 ENCOUNTER — Other Ambulatory Visit: Payer: Self-pay

## 2018-11-10 VITALS — BP 132/67 | HR 69 | Temp 98.2°F | Resp 17 | Ht 66.0 in | Wt 128.9 lb

## 2018-11-10 DIAGNOSIS — Z79899 Other long term (current) drug therapy: Secondary | ICD-10-CM | POA: Insufficient documentation

## 2018-11-10 DIAGNOSIS — Z800 Family history of malignant neoplasm of digestive organs: Secondary | ICD-10-CM | POA: Diagnosis not present

## 2018-11-10 DIAGNOSIS — C9000 Multiple myeloma not having achieved remission: Secondary | ICD-10-CM

## 2018-11-10 DIAGNOSIS — M810 Age-related osteoporosis without current pathological fracture: Secondary | ICD-10-CM | POA: Diagnosis not present

## 2018-11-10 DIAGNOSIS — B029 Zoster without complications: Secondary | ICD-10-CM | POA: Diagnosis not present

## 2018-11-10 DIAGNOSIS — D649 Anemia, unspecified: Secondary | ICD-10-CM | POA: Insufficient documentation

## 2018-11-10 DIAGNOSIS — L270 Generalized skin eruption due to drugs and medicaments taken internally: Secondary | ICD-10-CM | POA: Diagnosis not present

## 2018-11-10 DIAGNOSIS — D472 Monoclonal gammopathy: Secondary | ICD-10-CM

## 2018-11-10 LAB — CBC WITH DIFFERENTIAL (CANCER CENTER ONLY)
Abs Immature Granulocytes: 0.15 10*3/uL — ABNORMAL HIGH (ref 0.00–0.07)
Basophils Absolute: 0.1 10*3/uL (ref 0.0–0.1)
Basophils Relative: 0 %
Eosinophils Absolute: 1.5 10*3/uL — ABNORMAL HIGH (ref 0.0–0.5)
Eosinophils Relative: 9 %
HCT: 35.6 % — ABNORMAL LOW (ref 36.0–46.0)
Hemoglobin: 11.7 g/dL — ABNORMAL LOW (ref 12.0–15.0)
Immature Granulocytes: 1 %
Lymphocytes Relative: 12 %
Lymphs Abs: 2 10*3/uL (ref 0.7–4.0)
MCH: 31 pg (ref 26.0–34.0)
MCHC: 32.9 g/dL (ref 30.0–36.0)
MCV: 94.4 fL (ref 80.0–100.0)
Monocytes Absolute: 1.3 10*3/uL — ABNORMAL HIGH (ref 0.1–1.0)
Monocytes Relative: 8 %
Neutro Abs: 10.9 10*3/uL — ABNORMAL HIGH (ref 1.7–7.7)
Neutrophils Relative %: 70 %
Platelet Count: 323 10*3/uL (ref 150–400)
RBC: 3.77 MIL/uL — ABNORMAL LOW (ref 3.87–5.11)
RDW: 15.6 % — ABNORMAL HIGH (ref 11.5–15.5)
WBC Count: 15.8 10*3/uL — ABNORMAL HIGH (ref 4.0–10.5)
nRBC: 0 % (ref 0.0–0.2)

## 2018-11-10 LAB — CMP (CANCER CENTER ONLY)
ALT: 22 U/L (ref 0–44)
AST: 14 U/L — ABNORMAL LOW (ref 15–41)
Albumin: 2.6 g/dL — ABNORMAL LOW (ref 3.5–5.0)
Alkaline Phosphatase: 61 U/L (ref 38–126)
Anion gap: 9 (ref 5–15)
BUN: 17 mg/dL (ref 8–23)
CO2: 25 mmol/L (ref 22–32)
Calcium: 8.1 mg/dL — ABNORMAL LOW (ref 8.9–10.3)
Chloride: 97 mmol/L — ABNORMAL LOW (ref 98–111)
Creatinine: 0.83 mg/dL (ref 0.44–1.00)
GFR, Est AFR Am: >60 mL/min (ref >60)
GFR, Estimated: >60 mL/min (ref >60)
Glucose, Bld: 123 mg/dL — ABNORMAL HIGH (ref 70–99)
Potassium: 4.1 mmol/L (ref 3.5–5.1)
Sodium: 131 mmol/L — ABNORMAL LOW (ref 135–145)
Total Bilirubin: 0.4 mg/dL (ref 0.3–1.2)
Total Protein: 7.6 g/dL (ref 6.5–8.1)

## 2018-11-10 NOTE — Telephone Encounter (Signed)
Contacted patient and reviewed COVID 19 screening questions and patient answered no to all questions. She does have an intermittent cough that she has had for a while and is not new. She stated that she has a rash that has worsened and is red and angry from head to toe and painful. She stated that her husband will attend the appointment with her. Made her aware that we are not allowing any visitors and out staff will assist her in the wheelchair. Patient verbalized understanding.

## 2018-11-10 NOTE — Progress Notes (Signed)
Hematology and Oncology Follow Up Visit  Nicole Keith [id number] 01/21/1941 78 y.o. 11/10/2018 11:29 AM [name], [name], [name], MD   Principle Diagnosis: 78 year old woman with multiple myeloma after presenting with MGUS in 2016.  She developed IgG lambda multiple myeloma in December 2019.    Past therapy:  Velcade and dexamethasone started weekly on 07/27/2018.  Therapy changed in February 2020.  Current therapy: Revlimid 25 mg for 21 days out of a 28-day cycle with dexamethasone started in February 2020.  Interim History:  [name] returns today for a repeat evaluation.  Since her last visit, she has been taking Revlimid and developed diffuse rash noted in the last few weeks.  Started as localized area and evolved into diffuse rash involving more than 50% of her body surface.  She denies any other issues at this time including no nausea, vomiting or other complications.  She is no longer reporting any pain or discomfort.  Her appetite has been reasonable and has not had any hospitalization or illnesses.  Patient denied any alteration mental status, neuropathy, confusion or dizziness.  Denies any headaches or lethargy.  Denies any night sweats, weight loss or changes in appetite.  Denied orthopnea, dyspnea on exertion or chest discomfort.  Denies shortness of breath, difficulty breathing hemoptysis or cough.  Denies any abdominal distention, nausea, early satiety or dyspepsia.  Denies any hematuria, frequency, dysuria or nocturia.  Denies any ecchymosis or petechiae.  Denies any lymphadenopathy or clotting.  Denies any heat or cold intolerance.  Denies any anxiety or depression.  Remaining review of system is negative.           Medications: I have reviewed the patient's current medications.  Current Outpatient Medications  Medication Sig Dispense Refill  . acetaminophen (TYLENOL) 500 MG tablet Take 500 mg by mouth every 6 (six) hours as needed for moderate pain.    [name] Kitchen  acyclovir (ZOVIRAX) 400 MG tablet Take 1 tablet (400 mg total) by mouth 2 (two) times daily. 90 tablet 3  . clobetasol cream (TEMOVATE) 3.26 % Apply 1 application topically as needed (for itching).    [name] Kitchen dexamethasone (DECADRON) 4 MG tablet 5 tablets weekly with chemotherapy 100 tablet 3  . fentaNYL (DURAGESIC) 25 MCG/HR Place 1 patch onto the skin every 3 (three) days. 10 patch 0  . lenalidomide (REVLIMID) 15 MG capsule Take 1 capsule (15 mg total) by mouth daily. Take for 21 days on, 7 days off. Repeat every 28 days  auth # [id number] 21 capsule 0  . Olopatadine HCl 0.2 % SOLN INSTILL 1 DROP INTO EACH EYE ONCE DAILY    . oxyCODONE (OXY IR/ROXICODONE) 5 MG immediate release tablet Take 1 tablet (5 mg total) by mouth every 4 (four) hours as needed for severe pain. 90 tablet 0  . sodium chloride (OCEAN) 0.65 % SOLN nasal spray Place 1 spray into both nostrils as needed for congestion.    . triamcinolone cream (KENALOG) 0.5 % Apply 1 application topically as needed (for itching).     No current facility-administered medications for this visit.      Allergies:  Allergies  Allergen Reactions  . Betadine [Povidone Iodine] Rash  . Flagyl [Metronidazole] Diarrhea  . Fosamax [Alendronate Sodium] Other (See Comments)    GI distress  . Bactrim [Sulfamethoxazole-Trimethoprim]     nausea  . Doxepin Hcl     Irritable, hallucinations  . Hydroxyzine Hcl     hallucinations  . Valium [Diazepam] Other (See Comments)  . Actonel [  Risedronate Sodium] Other (See Comments)    GI distress  . Boniva [Ibandronic Acid] Other (See Comments)    GI distress  . Doxycycline Rash  . Penicillins Rash    Ancef was OK.    Past Medical History, Surgical history, Social history, and Family History were reviewed and updated.   Physical Exam:  Blood pressure 132/67, pulse 69, temperature 98.2 F (36.8 C), temperature source Oral, resp. rate 17, height '5\' 6"'  (1.676 m), weight 128 lb 14.4 oz (58.5 kg), SpO2 100  %.     ECOG: 1     General appearance: Comfortable appearing without any discomfort Head: Normocephalic without any trauma Oropharynx: Mucous membranes are moist and pink without any thrush or ulcers. Eyes: Pupils are equal and round reactive to light. Lymph nodes: No cervical, supraclavicular, inguinal or axillary lymphadenopathy.   Heart:regular rate and rhythm.  S1 and S2 without leg edema. Lung: Clear without any rhonchi or wheezes.  No dullness to percussion. Abdomin: Soft, nontender, nondistended with good bowel sounds.  No hepatosplenomegaly. Musculoskeletal: No joint deformity or effusion.  Full range of motion noted. Neurological: No deficits noted on motor, sensory and deep tendon reflex exam. Skin: Diffuse erythroderma involving the chest, back arms and lower extremities.     Lab Results: Lab Results  Component Value Date   WBC 15.8 (H) 11/10/2018   HGB 11.7 (L) 11/10/2018   HCT 35.6 (L) 11/10/2018   MCV 94.4 11/10/2018   PLT 323 11/10/2018     Chemistry      Component Value Date/Time   NA 132 (L) 10/20/2018 1115   NA 135 (L) 06/11/2017 0753   K 3.8 10/20/2018 1115   K 4.0 06/11/2017 0753   CL 102 10/20/2018 1115   CO2 24 10/20/2018 1115   CO2 26 06/11/2017 0753   BUN 17 10/20/2018 1115   BUN 12.8 06/11/2017 0753   CREATININE 0.76 10/20/2018 1115   CREATININE 0.8 06/11/2017 0753      Component Value Date/Time   CALCIUM 7.8 (L) 10/20/2018 1115   CALCIUM 8.8 06/11/2017 0753   ALKPHOS 54 10/20/2018 1115   ALKPHOS 72 06/11/2017 0753   AST 21 10/20/2018 1115   AST 21 06/11/2017 0753   ALT 21 10/20/2018 1115   ALT 13 06/11/2017 0753   BILITOT 0.4 10/20/2018 1115   BILITOT 0.35 06/11/2017 0753      Results for Nicole Keith (MRN [id number]) as of 11/10/2018 10:36  Ref. Range 08/17/2018 10:33 10/20/2018 11:16  IgG (Immunoglobin G), Serum Latest Ref Range: 700 - 1,600 mg/dL 8,129 (H) 5,751 (H)       Impression and Plan:  78 year old woman  with:  1.  Multiple myeloma diagnosed in December 2019.  She was found to have IgG lambda with increased plasma cell infiltration of the bone marrow.  She has been on Revlimid and completed post 2 cycles of therapy with excellent response in her protein studies.  Her IgG level decreased to 5751.  Her M spike is down to 3.9 from 5.6.  She developed a diffuse erythematous rash related to Revlimid.  I have instructed her to hold Revlimid treatment for 1 week and report to me any improvement or decline.  Anticipate restarting Revlimid in 1 to 2 weeks and potentially reduce the dose if needed.  She is agreeable to proceed with this plan.  2.  Anemia: Hemoglobin continues to improve at this time.  Her anemia is related to plasma cell disorder and appears to be improving.  3. Osteoporosis and compression fracture: She is currently on Zometa which she will receive every 3 months.  Last Zometa given in March 2020.  4.  Herpes zoster reactivation: I recommended continuing acyclovir for the time being.  5.  Back pain: Resolved at this time and is no longer taking any pain medication.  6.  Antiemetics: Compazine is available to her at this time.  7.  Rash: Related to Revlimid.  I have asked her to discontinue Revlimid for the time being and potentially resume it after rash has resolved with potential dose reduction and delays anticipated.  In the meantime she continue to use hydrocortisone cream as well as antihistamine.  8. Follow-up: In 3 weeks to follow her progress.  25  minutes was spent with the patient face-to-face today.  More than 50% of time was spent on reviewing her disease status, laboratory data, assessing complications related to therapy and addressing future plan of care.   Zola Button, MD 4/1/202011:29 AM

## 2018-11-10 NOTE — Telephone Encounter (Signed)
Scheduled appt per 4/1 sch message.  Patient aware of appt date and time.

## 2018-11-11 LAB — MULTIPLE MYELOMA PANEL, SERUM
Albumin SerPl Elph-Mcnc: 2.9 g/dL (ref 2.9–4.4)
Albumin/Glob SerPl: 0.7 (ref 0.7–1.7)
Alpha 1: 0.4 g/dL (ref 0.0–0.4)
Alpha2 Glob SerPl Elph-Mcnc: 0.8 g/dL (ref 0.4–1.0)
B-Globulin SerPl Elph-Mcnc: 0.6 g/dL — ABNORMAL LOW (ref 0.7–1.3)
Gamma Glob SerPl Elph-Mcnc: 2.5 g/dL — ABNORMAL HIGH (ref 0.4–1.8)
Globulin, Total: 4.2 g/dL — ABNORMAL HIGH (ref 2.2–3.9)
IgA: 31 mg/dL — ABNORMAL LOW (ref 64–422)
IgG (Immunoglobin G), Serum: 3183 mg/dL — ABNORMAL HIGH (ref 586–1602)
IgM (Immunoglobulin M), Srm: 30 mg/dL (ref 26–217)
M Protein SerPl Elph-Mcnc: 2.2 g/dL — ABNORMAL HIGH
Total Protein ELP: 7.1 g/dL (ref 6.0–8.5)

## 2018-11-11 LAB — KAPPA/LAMBDA LIGHT CHAINS
Kappa free light chain: 13.1 mg/L (ref 3.3–19.4)
Kappa, lambda light chain ratio: 0.56 (ref 0.26–1.65)
Lambda free light chains: 23.3 mg/L (ref 5.7–26.3)

## 2018-11-16 ENCOUNTER — Inpatient Hospital Stay (HOSPITAL_BASED_OUTPATIENT_CLINIC_OR_DEPARTMENT_OTHER): Payer: Medicare Other | Admitting: Medical

## 2018-11-16 ENCOUNTER — Other Ambulatory Visit: Payer: Self-pay

## 2018-11-16 ENCOUNTER — Telehealth: Payer: Self-pay

## 2018-11-16 VITALS — BP 114/72 | HR 66 | Temp 98.5°F | Resp 18 | Ht 66.0 in | Wt 126.3 lb

## 2018-11-16 DIAGNOSIS — C9000 Multiple myeloma not having achieved remission: Secondary | ICD-10-CM | POA: Diagnosis not present

## 2018-11-16 DIAGNOSIS — L270 Generalized skin eruption due to drugs and medicaments taken internally: Secondary | ICD-10-CM

## 2018-11-16 DIAGNOSIS — Z800 Family history of malignant neoplasm of digestive organs: Secondary | ICD-10-CM | POA: Diagnosis not present

## 2018-11-16 DIAGNOSIS — Z79899 Other long term (current) drug therapy: Secondary | ICD-10-CM

## 2018-11-16 NOTE — Telephone Encounter (Signed)
Spoke with patient and she is agreeable to be seen in [facility] today. She understands that she will receive a call from schedulers for the specific appointment time for this afternoon. No other questions or concerns at this time.

## 2018-11-16 NOTE — Telephone Encounter (Signed)
-----   Message from Wyatt Portela, MD sent at 11/16/2018  9:47 AM EDT ----- Regarding: RE: Follow up report since off Revlimid 4/1 Please let her know this:  1. We need to see her at symptom management clinic today if she can come. We need to see her rash.  2. Hold dexamethasone for now.  3. Her labs continue to show improvement in cancer and approaching remission. She will NOT go back on revlimid.   Thanks.     ----- Message ----- From: Scot Dock, RN Sent: 11/16/2018   9:29 AM EDT To: Wyatt Portela, MD Subject: Follow up report since off Revlimid 4/1        Stated that she has been using hydrocortisone cream and taking Benadryl daily since 4/1. The rash has turned into blisters from head to toe and her skin has been peeling as well. -States has generalized tremors since this has all occurred. -ankles, head, and neck swollen every morning and then resolves during day -constipation in which she is taking Miralax and drinking plenty of fluids. -Am and PM nausea and she is taking prn compazine which is effective  She would like to know if she can take 3, 4mg , Dex tabs today and then 2 on Friday. She would also like to know if her lab work showed any improvement in her cancer as she is unwilling to go back on revlimid. Next appt  4/22 lab and MD.

## 2018-11-16 NOTE — Progress Notes (Signed)
Pt presents with full body red spotted rash that has begun to peel large amounts of skin.  No pus or drainage or bleeding found.  Afebrile.  Denies CP or SOB or trouble swallowing.  Hx Revlimid use, taken off per MD [facility] once rash started and decadron prescribed.  Pt advised to stop taking decadron per [facility] MD recently.  Reports aching and tenderness on skin.

## 2018-11-16 NOTE — Patient Instructions (Signed)
Constipation Management  Magnesium Citrate, drink 1/2 bottle, drink remainder if no bowel movement with 30 to 60 minutes  Or  30 mg (1 tablespoon) of Milk of Magnesia in 8 ounces of prune juice, warm in microwave for 20 seconds    Begin the following after you have had a bowel movement:  Senna-S, 1 to 2 tablets twice daily  MiraLAX 17 grams in 8 ounces of liquids 1 to 2 times daily as needed   Remember to remain well hydrated. Drink, Drink, Drink non-caffeinated beverages.   Adjust these medications based on your response. If your bowel movements become too loose then decrease the amount of Senna-S and/or MiraLAX that you are using. If your bowel movements become too firm or are difficult to pass, the increase the amount of Senna-S and/or MiraLAX that you are using and increase your intake of water.  

## 2018-11-18 ENCOUNTER — Telehealth: Payer: Self-pay | Admitting: *Deleted

## 2018-11-18 NOTE — Progress Notes (Signed)
Symptoms Management Clinic Progress Note   [name] [id number] [birth year] 78 y.o.  Nicole Keith is managed by Dr. Zola Button  Actively treated with chemotherapy/immunotherapy/hormonal therapy: yes  Current therapy: Revlimid  Next scheduled appointment with provider: 12/01/2018  Assessment: Plan:    Dermatitis due to drug  Multiple myeloma, remission status unspecified ([place])   Dermatitis due to Revlimid: The patient continues off of Revlimid.  She was instructed to continue using hydrocortisone cream, Benadryl as needed, and moisturizing creams.  Multiple myeloma: The patient continues to be followed by Dr. Zola Button.  She has most recently been treated with Revlimid.  She will follow-up with Dr. Alen Blew as scheduled on 12/01/2018 and will remain off of Revlimid.  Please see After Visit Summary for patient specific instructions.  Future Appointments  Date Time Provider [place]  12/01/2018 11:15 AM [facility] 2 [facility] None  12/01/2018 11:45 AM Shadad, Mathis Dad, MD [facility] None    No orders of the defined types were placed in this encounter.      Subjective:   Patient ID:  Nicole Keith is a 78 y.o. (DOB 11-Oct-1940) female.  Chief Complaint:  Chief Complaint  Patient presents with   Rash    HPI Nicole Keith   is a 78 year old female with a history of multiple myeloma who is managed by Dr. Alen Blew and is recently been treated with Revlimid.  She reports that she had erythema of her skin when she saw Dr. Alen Blew on 11/10/2018.  She states that she developed blisters on her skin which coalesced into one large blister and then peeled.  She has had itching.  She has been using hydrocortisone cream and oral Benadryl.  She had stopped her steroids but had taken them this morning.  She reports that her rash was painful but is now improving.  She has extensive scaling of her skin.  She denies fevers, chills, or sweats.  She is scheduled  to see Dr. Alen Blew in follow-up on 12/01/2018.  Medications: I have reviewed the patient's current medications.  Allergies:  Allergies  Allergen Reactions   Revlimid [Lenalidomide] Rash   Betadine [Povidone Iodine] Rash   Flagyl [Metronidazole] Diarrhea   Fosamax [Alendronate Sodium] Other (See Comments)    GI distress   Bactrim [Sulfamethoxazole-Trimethoprim]     nausea   Doxepin Hcl     Irritable, hallucinations   Hydroxyzine Hcl     hallucinations   Valium [Diazepam] Other (See Comments)   Actonel [Risedronate Sodium] Other (See Comments)    GI distress   Boniva [Ibandronic Acid] Other (See Comments)    GI distress   Doxycycline Rash   Penicillins Rash    Ancef was OK.    Past Medical History:  Diagnosis Date   Allergic rhinitis    Arthritis    Colitis, collagenous    Diverticular disease    severe sig tics/fixation 2012   Family hx of colon cancer    Fracture of femoral neck, right ([place]) 01/09/2013   GERD (gastroesophageal reflux disease)    exacerbation of reflux-like symptoms 04/2011   Hiatal hernia    Hyperlipidemia    Osteoarthritis of right hip 01/10/2013    Past Surgical History:  Procedure Laterality Date   CHOLECYSTECTOMY     EYE SURGERY  feb 2016   cataract right eye   LEG SURGERY Left    TONSILLECTOMY     TOTAL HIP ARTHROPLASTY Right 01/10/2013   Procedure: TOTAL HIP ARTHROPLASTY;  Surgeon: Johnny Bridge, MD;  Location: [facility];  Service: Orthopedics;  Laterality: Right;    Family History  Problem Relation Age of Onset   Congestive Heart Failure Mother    Arthritis-Osteo Mother    Colon cancer Mother    Heart attack Father    Rheum arthritis Father    Colon cancer Maternal Aunt    Colon cancer Maternal Aunt    Hypothyroidism Sister     Social History   Socioeconomic History   Marital status: Married    Spouse name: Not on file   Number of children: 0   Years of education: Not on file   Highest  education level: Some college, no degree  Occupational History   Occupation: retired  Scientist, product/process development strain: Not on file   Food insecurity:    Worry: Not on file    Inability: Not on file   Transportation needs:    Medical: Not on file    Non-medical: Not on file  Tobacco Use   Smoking status: Former Smoker    Last attempt to quit: 01/10/1972    Years since quitting: 46.8   Smokeless tobacco: Never Used   Tobacco comment: quit 1973  Substance and Sexual Activity   Alcohol use: No   Drug use: No   Sexual activity: Not on file  Lifestyle   Physical activity:    Days per week: Not on file    Minutes per session: Not on file   Stress: Not on file  Relationships   Social connections:    Talks on phone: Not on file    Gets together: Not on file    Attends religious service: Not on file    Active member of club or organization: Not on file    Attends meetings of clubs or organizations: Not on file    Relationship status: Not on file   Intimate partner violence:    Fear of current or ex partner: Not on file    Emotionally abused: Not on file    Physically abused: Not on file    Forced sexual activity: Not on file  Other Topics Concern   Not on file  Social History Narrative   Married, no children. Drinks 2 cups decaf coffee a day. Walks daily. Is active, plays golf, she and her husband go hiking frequently. 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    Past Medical History, Surgical history, Social history, and Family history were reviewed and updated as appropriate.   Please see review of systems for further details on the patient's review from today.   Review of Systems:  Review of Systems  Constitutional: Negative for chills, diaphoresis and fever.  HENT: Negative for facial swelling and trouble swallowing.   Respiratory: Negative for cough, chest tightness and shortness of breath.   Cardiovascular: Negative for chest pain.   Skin: Positive for rash.    Objective:   Physical Exam:  BP 114/72 (BP Location: Left Arm, Patient Position: Sitting)    Pulse 66    Temp 98.5 F (36.9 C) (Oral)    Resp 18    Ht 5' 6" (1.676 m)    Wt 126 lb 4.8 oz (57.3 kg)    SpO2 100%    BMI 20.39 kg/m  ECOG: 1  Physical Exam Constitutional:      General: She is not in acute distress.    Appearance: She is not ill-appearing or diaphoretic.  HENT:  Head: Normocephalic.  Eyes:     General: No scleral icterus.       Right eye: No discharge.        Left eye: No discharge.     Conjunctiva/sclera: Conjunctivae normal.  Skin:    General: Skin is warm and dry.     Coloration: Skin is not jaundiced.     Findings: Erythema and rash present.     Comments: Extensive skin scaling of the trunk, abdomen, and upper and lower extremities.  The lower extremity showed multiple erythematous macular lesions.  Neurological:     Gait: Gait normal.  Psychiatric:        Mood and Affect: Mood normal.        Behavior: Behavior normal.        Thought Content: Thought content normal.        Judgment: Judgment normal.     Lab Review:     Component Value Date/Time   NA 131 (L) 11/10/2018 1054   NA 135 (L) 06/11/2017 0753   K 4.1 11/10/2018 1054   K 4.0 06/11/2017 0753   CL 97 (L) 11/10/2018 1054   CO2 25 11/10/2018 1054   CO2 26 06/11/2017 0753   GLUCOSE 123 (H) 11/10/2018 1054   GLUCOSE 103 06/11/2017 0753   BUN 17 11/10/2018 1054   BUN 12.8 06/11/2017 0753   CREATININE 0.83 11/10/2018 1054   CREATININE 0.8 06/11/2017 0753   CALCIUM 8.1 (L) 11/10/2018 1054   CALCIUM 8.8 06/11/2017 0753   PROT 7.6 11/10/2018 1054   PROT 11.2 (H) 06/11/2017 0753   PROT 10.5 (H) 06/11/2017 0753   ALBUMIN 2.6 (L) 11/10/2018 1054   ALBUMIN 2.9 (L) 06/11/2017 0753   AST 14 (L) 11/10/2018 1054   AST 21 06/11/2017 0753   ALT 22 11/10/2018 1054   ALT 13 06/11/2017 0753   ALKPHOS 61 11/10/2018 1054   ALKPHOS 72 06/11/2017 0753   BILITOT 0.4  11/10/2018 1054   BILITOT 0.35 06/11/2017 0753   GFRNONAA >60 11/10/2018 1054   GFRNONAA 78 09/15/2016 1155   GFRAA >60 11/10/2018 1054   GFRAA >89 09/15/2016 1155       Component Value Date/Time   WBC 15.8 (H) 11/10/2018 1054   WBC 7.9 11/11/2017 0949   RBC 3.77 (L) 11/10/2018 1054   HGB 11.7 (L) 11/10/2018 1054   HGB 10.8 (L) 06/11/2017 0753   HCT 35.6 (L) 11/10/2018 1054   HCT 32.2 (L) 06/11/2017 0753   PLT 323 11/10/2018 1054   PLT 321 06/11/2017 0753   MCV 94.4 11/10/2018 1054   MCV 95.4 06/11/2017 0753   MCH 31.0 11/10/2018 1054   MCHC 32.9 11/10/2018 1054   RDW 15.6 (H) 11/10/2018 1054   RDW 15.6 (H) 06/11/2017 0753   LYMPHSABS 2.0 11/10/2018 1054   LYMPHSABS 2.1 06/11/2017 0753   MONOABS 1.3 (H) 11/10/2018 1054   MONOABS 0.5 06/11/2017 0753   EOSABS 1.5 (H) 11/10/2018 1054   EOSABS 0.1 06/11/2017 0753   BASOSABS 0.1 11/10/2018 1054   BASOSABS 0.1 06/11/2017 0753   -------------------------------  Imaging from last 24 hours (if applicable):  Radiology interpretation: No results found.      This patient was seen with Dr. Alen Blew with my treatment plan reviewed with him. He expressed agreement with my medical management of this patient.  Attending addendum:  Patient seen and examined personally today and agree with the assessment and plan outlined above.  Patient reports diffuse erythematous rash that has improved and currently peeling.  On her exam I do not see any evidence of blisters or any residual erythema.  No evidence of any desquamation or bullae.  Remaining exam did not show any evidence of abnormalities.  No worsening edema.  Her appearance appeared comfortable and nontoxic.  I recommended continued supportive management and continue to hold Revlimid for likely to be discontinued permanently given the severity of her dermatological manifestation.   Zola Button MD 11/18/18

## 2018-11-18 NOTE — Telephone Encounter (Signed)
Patient called to give update in regards to post Revelmid reaction.  She has stopped all medications except for Acyclovir and has been taking Benadryl 50 mg BID and Tylenol PM.  She reports that the blistering rash and peeling has improved but her scalp and neck is still swollen and she is experiencing a jitteriness/anxiety.  She wants to know if she can stop the Acyclovir and if Motrin PM would be better since the Tylenol PM isn't very effective or if you had a better recommendation for the jitteriness.

## 2018-11-18 NOTE — Telephone Encounter (Signed)
Relayed instructions to patient.

## 2018-11-18 NOTE — Telephone Encounter (Signed)
Ok to use Motrin. She needs to cut down on Benadryl (25 twice a day) which can contribute to jitteriness.She needs to keep Acyclovir for shingles prophylaxis.

## 2018-12-01 ENCOUNTER — Telehealth: Payer: Self-pay

## 2018-12-01 ENCOUNTER — Inpatient Hospital Stay (HOSPITAL_BASED_OUTPATIENT_CLINIC_OR_DEPARTMENT_OTHER): Payer: Medicare Other | Admitting: Oncology

## 2018-12-01 ENCOUNTER — Other Ambulatory Visit: Payer: Self-pay

## 2018-12-01 ENCOUNTER — Inpatient Hospital Stay: Payer: Medicare Other

## 2018-12-01 ENCOUNTER — Telehealth: Payer: Self-pay | Admitting: Pharmacist

## 2018-12-01 VITALS — BP 138/82 | HR 79 | Temp 98.2°F | Resp 18 | Ht 66.0 in | Wt 130.1 lb

## 2018-12-01 DIAGNOSIS — C9000 Multiple myeloma not having achieved remission: Secondary | ICD-10-CM

## 2018-12-01 DIAGNOSIS — L270 Generalized skin eruption due to drugs and medicaments taken internally: Secondary | ICD-10-CM

## 2018-12-01 DIAGNOSIS — B029 Zoster without complications: Secondary | ICD-10-CM

## 2018-12-01 DIAGNOSIS — M810 Age-related osteoporosis without current pathological fracture: Secondary | ICD-10-CM | POA: Diagnosis not present

## 2018-12-01 DIAGNOSIS — D472 Monoclonal gammopathy: Secondary | ICD-10-CM

## 2018-12-01 DIAGNOSIS — D649 Anemia, unspecified: Secondary | ICD-10-CM

## 2018-12-01 DIAGNOSIS — M4850XA Collapsed vertebra, not elsewhere classified, site unspecified, initial encounter for fracture: Secondary | ICD-10-CM

## 2018-12-01 LAB — CBC WITH DIFFERENTIAL (CANCER CENTER ONLY)
Abs Immature Granulocytes: 0.02 10*3/uL (ref 0.00–0.07)
Basophils Absolute: 0.1 10*3/uL (ref 0.0–0.1)
Basophils Relative: 2 %
Eosinophils Absolute: 0.7 10*3/uL — ABNORMAL HIGH (ref 0.0–0.5)
Eosinophils Relative: 8 %
HCT: 36.8 % (ref 36.0–46.0)
Hemoglobin: 11.6 g/dL — ABNORMAL LOW (ref 12.0–15.0)
Immature Granulocytes: 0 %
Lymphocytes Relative: 28 %
Lymphs Abs: 2.3 10*3/uL (ref 0.7–4.0)
MCH: 30.7 pg (ref 26.0–34.0)
MCHC: 31.5 g/dL (ref 30.0–36.0)
MCV: 97.4 fL (ref 80.0–100.0)
Monocytes Absolute: 0.7 10*3/uL (ref 0.1–1.0)
Monocytes Relative: 9 %
Neutro Abs: 4.4 10*3/uL (ref 1.7–7.7)
Neutrophils Relative %: 53 %
Platelet Count: 396 10*3/uL (ref 150–400)
RBC: 3.78 MIL/uL — ABNORMAL LOW (ref 3.87–5.11)
RDW: 16.1 % — ABNORMAL HIGH (ref 11.5–15.5)
WBC Count: 8.2 10*3/uL (ref 4.0–10.5)
nRBC: 0 % (ref 0.0–0.2)

## 2018-12-01 LAB — CMP (CANCER CENTER ONLY)
ALT: 28 U/L (ref 0–44)
AST: 26 U/L (ref 15–41)
Albumin: 2.9 g/dL — ABNORMAL LOW (ref 3.5–5.0)
Alkaline Phosphatase: 60 U/L (ref 38–126)
Anion gap: 6 (ref 5–15)
BUN: 13 mg/dL (ref 8–23)
CO2: 27 mmol/L (ref 22–32)
Calcium: 8.1 mg/dL — ABNORMAL LOW (ref 8.9–10.3)
Chloride: 104 mmol/L (ref 98–111)
Creatinine: 0.79 mg/dL (ref 0.44–1.00)
GFR, Est AFR Am: >60 mL/min (ref >60)
GFR, Estimated: >60 mL/min (ref >60)
Glucose, Bld: 106 mg/dL — ABNORMAL HIGH (ref 70–99)
Potassium: 4.1 mmol/L (ref 3.5–5.1)
Sodium: 137 mmol/L (ref 135–145)
Total Bilirubin: 0.4 mg/dL (ref 0.3–1.2)
Total Protein: 7.6 g/dL (ref 6.5–8.1)

## 2018-12-01 MED ORDER — POMALIDOMIDE 1 MG PO CAPS: 1.0000 mg | ORAL_CAPSULE | Freq: Every day | ORAL | 0 refills | Status: DC

## 2018-12-01 NOTE — Progress Notes (Signed)
Hematology and Oncology Follow Up Visit  Nicole Keith [id number] [birth year] 78 y.o. 12/01/2018 11:32 AM Nicole Keith, [name], [name], MD   Principle Diagnosis: 78 year old woman with IgG lambda multiple myeloma diagnosed in December 2019.  She had MGUS in 2016.    Past therapy:    Velcade and dexamethasone started weekly on 07/27/2018.  Therapy changed in February 2020.  Revlimid 25 mg for 21 days out of a 28-day cycle with dexamethasone started in February 2020.  Therapy was held after 2 cycles because of severe dermatological toxicity.  Current therapy: Under consideration for restarting of systemic therapy.  Interim History:  Nicole Keith is here for a follow-up.  Since last visit, she reports much improved skin rash that has nearly resolved although she still has some itching.  She denies any blisters or desquamation although she does have some erythema predominantly on her chest and back.  She has been using hydrocortisone cream as well as Benadryl as needed.  She has reported very little pain at this time and overall appetite quality of life is improving.  She denied headaches, blurry vision, syncope or seizures.  Denies any fevers, chills or sweats.  Denied chest pain, palpitation, orthopnea or leg edema.  Denied cough, wheezing or hemoptysis.  Denied nausea, vomiting or abdominal pain.  Denies any constipation or diarrhea.  Denies any frequency urgency or hesitancy.  Denies any arthralgias or myalgias.   Denies any bleeding or clotting tendency.  Denies any easy bruising.  Denies any hair or nail changes.  Denies any anxiety or depression.  Remaining review of system is negative.            Medications: I have reviewed the patient's current medications.  Current Outpatient Medications  Medication Sig Dispense Refill  . acetaminophen (TYLENOL) 500 MG tablet Take 500 mg by mouth every 6 (six) hours as needed for moderate pain.    [name] Kitchen acyclovir (ZOVIRAX) 400 MG tablet Take 1  tablet (400 mg total) by mouth 2 (two) times daily. 90 tablet 3  . dexamethasone (DECADRON) 4 MG tablet 5 tablets weekly with chemotherapy (Patient not taking: Reported on 11/10/2018) 100 tablet 3  . fentaNYL (DURAGESIC) 25 MCG/HR Place 1 patch onto the skin every 3 (three) days. (Patient not taking: Reported on 11/16/2018) 10 patch 0  . Olopatadine HCl 0.2 % SOLN INSTILL 1 DROP INTO EACH EYE ONCE DAILY    . oxyCODONE (OXY IR/ROXICODONE) 5 MG immediate release tablet Take 1 tablet (5 mg total) by mouth every 4 (four) hours as needed for severe pain. (Patient not taking: Reported on 11/10/2018) 90 tablet 0  . sodium chloride (OCEAN) 0.65 % SOLN nasal spray Place 1 spray into both nostrils as needed for congestion.     No current facility-administered medications for this visit.      Allergies:  Allergies  Allergen Reactions  . Revlimid [Lenalidomide] Rash  . Betadine [Povidone Iodine] Rash  . Flagyl [Metronidazole] Diarrhea  . Fosamax [Alendronate Sodium] Other (See Comments)    GI distress  . Bactrim [Sulfamethoxazole-Trimethoprim]     nausea  . Doxepin Hcl     Irritable, hallucinations  . Hydroxyzine Hcl     hallucinations  . Valium [Diazepam] Other (See Comments)  . Actonel [Risedronate Sodium] Other (See Comments)    GI distress  . Boniva [Ibandronic Acid] Other (See Comments)    GI distress  . Doxycycline Rash  . Penicillins Rash    Ancef was OK.    Past Medical History,  Surgical history, Social history, and Family History were reviewed and updated.   Physical Exam:   Blood pressure 138/82, pulse 79, temperature 98.2 F (36.8 C), temperature source Oral, resp. rate 18, height _0  (1.676 m), weight 130 lb 1.6 oz (59 kg), SpO2 100 %.     ECOG: 1     General appearance: Alert, awake without any distress. Head: Atraumatic without abnormalities Oropharynx: Without any thrush or ulcers. Eyes: No scleral icterus. Lymph nodes: No lymphadenopathy noted in the cervical,  supraclavicular, or axillary nodes Heart:regular rate and rhythm, without any murmurs or gallops.   Lung: Clear to auscultation without any rhonchi, wheezes or dullness to percussion. Abdomin: Soft, nontender without any shifting dullness or ascites. Musculoskeletal: No clubbing or cyanosis. Neurological: No motor or sensory deficits. Skin: Mild erythema still noted on chest and back.     Lab Results: Lab Results  Component Value Date   WBC 15.8 (H) 11/10/2018   HGB 11.7 (L) 11/10/2018   HCT 35.6 (L) 11/10/2018   MCV 94.4 11/10/2018   PLT 323 11/10/2018     Chemistry      Component Value Date/Time   NA 131 (L) 11/10/2018 1054   NA 135 (L) 06/11/2017 0753   K 4.1 11/10/2018 1054   K 4.0 06/11/2017 0753   CL 97 (L) 11/10/2018 1054   CO2 25 11/10/2018 1054   CO2 26 06/11/2017 0753   BUN 17 11/10/2018 1054   BUN 12.8 06/11/2017 0753   CREATININE 0.83 11/10/2018 1054   CREATININE 0.8 06/11/2017 0753      Component Value Date/Time   CALCIUM 8.1 (L) 11/10/2018 1054   CALCIUM 8.8 06/11/2017 0753   ALKPHOS 61 11/10/2018 1054   ALKPHOS 72 06/11/2017 0753   AST 14 (L) 11/10/2018 1054   AST 21 06/11/2017 0753   ALT 22 11/10/2018 1054   ALT 13 06/11/2017 0753   BILITOT 0.4 11/10/2018 1054   BILITOT 0.35 06/11/2017 0753      Results for Nicole Keith (MRN [id number]) as of 12/01/2018 11:34  Ref. Range 02/15/2018 11:08 07/12/2018 12:04 08/17/2018 10:33 10/20/2018 11:16 11/10/2018 10:55  IgG (Immunoglobin G), Serum Latest Ref Range: 586 - 1,602 mg/dL 7,247 (H) 8,977 (H) 8,129 (H) 5,751 (H) 3,183 (H)   Results for Nicole Keith (MRN [id number]) as of 12/01/2018 11:34  Ref. Range 07/12/2018 12:04 08/17/2018 10:33 10/20/2018 11:16 11/10/2018 10:55  M Protein SerPl Elph-Mcnc Latest Ref Range: Not Observed g/dL 5.9 (H) 5.6 (H) 3.9 (H) 2.2 (H)     Impression and Plan:  78 year old woman with:  1.  IgG lambda multiple myeloma diagnosed in December 2019.    She completed 2 cycles of  Revlimid with therapy discontinued because of severe dermatological toxicity.  Protein studies obtained after 2 cycles showed excellent response with M spike down to 2.2 g/dL and IgG level decreased down to 3100 approaching 50% response on both fronts.  The natural course of this disease and risks and benefits of resuming Revlimid at a lower dose was discussed.  Alternative therapy including Pomalyst was also discussed.  After discussion, she is adamant not to take Revlimid and would prefer to proceed with Pomalyst.  Complication associated with the stroke was discussed today which include nausea, fatigue, dermatological toxicity as well as myelosuppression.  We will start a lower dose at 1 mg daily for 21 days out of a 28-day cycle.  Anticipate the start of treatment a week from Friday.  2.  Anemia: His hemoglobin continues  to improve with treating her plasma cell disorder.  3. Osteoporosis and compression fracture: She remains on Zometa now and she will receive it every 3 months.  4.  Herpes zoster reactivation: She is currently on acyclovir will be discontinued in the future.  5.  Back pain: Resolved at this time.  6.  Antiemetics: Antiemetics are available to her without any issue of nausea and vomiting.  7.  Rash: Improving and related to Revlimid.  I have recommended continue hydrocortisone cream and Benadryl.  8. Follow-up: In 4 weeks to follow her progress.  25  minutes was spent with the patient face-to-face today.  More than 50% of time was dedicated to reviewing her disease status, treatment options and managing complications related to therapy.  Zola Button, MD 4/22/202011:32 AM

## 2018-12-01 NOTE — Telephone Encounter (Signed)
Pomalyst prescription and patient-physician agreement form faxed to [facility] at [phone]. Confirmation of fax received.

## 2018-12-01 NOTE — Telephone Encounter (Signed)
Fax received from [facility] that patient has successfully been enrolled in the [facility] program. Pomalyst auth# [id number]. Copy of prescription with auth# placed in oral chemo folder.

## 2018-12-01 NOTE — Telephone Encounter (Signed)
Oral Oncology Pharmacist Encounter  Received new prescription for Pomalyst (pomalidomide) for the treatment of multiple myeloma not having achieved remission in conjunction with dexamethasone, planned duration until disease progression or unacceptable toxicity.  Patient received treatment with Velcade and dexamethasone Dec 2019-Feb 2020 without adequate response. Treatment was changed to Revlimid plus dexamethasone in Feb 2020, which was continued until March 2020 when Revlimid was discontinued due to severe dermatologic reaction and GI adverse events. Rash is now healing well and patient is under evaluation to initiate therapy with low dose Pomlayst (26m once daily for 21 days on, 7 days off, repeat every 28 days) and dexamethasone  There is ~25% cross-reactivity for hypersensitivity reactions with Revlimid and Pomalyst. Patient will be counseled to continue hydrocortisone cream and systemic diphenhydramine.  Labs from 12/01/2018 assessed, OK for treatment initiation. Eosiniphils remain elevated  Current medication list in Epic reviewed, no significant DDIs with Pomalyst identified.  Prescription for Pomalyst will be e-scribed to appropriate specialty pharmacy for dispensing once insurance authorization is approved. [facility].  [facility] [place]  Oral Oncology Clinic will continue to follow for insurance authorization, copayment issues, initial counseling and start date.  JJohny Drilling PharmD, BCPS, BCOP  12/01/2018 4:09 PM Oral Oncology Clinic [phone]

## 2018-12-02 ENCOUNTER — Telehealth: Payer: Self-pay

## 2018-12-02 ENCOUNTER — Telehealth: Payer: Self-pay | Admitting: Oncology

## 2018-12-02 LAB — KAPPA/LAMBDA LIGHT CHAINS
Kappa free light chain: 8.9 mg/L (ref 3.3–19.4)
Kappa, lambda light chain ratio: 0.1 — ABNORMAL LOW (ref 0.26–1.65)
Lambda free light chains: 91.8 mg/L — ABNORMAL HIGH (ref 5.7–26.3)

## 2018-12-02 NOTE — Telephone Encounter (Signed)
Scheduled 5/26 appt per sch msg. Called and spoke with patient. Patient does not want early appt. Will advise MD to see if there is another specified time he would like due to the date and time originally being requested by the MD.

## 2018-12-02 NOTE — Telephone Encounter (Signed)
Oral Oncology Patient Advocate Encounter  Received notification from [employer] that prior authorization for Pomalyst is required.  PA submitted on CoverMyMeds Key [id number] Status is pending  Oral Oncology Clinic will continue to follow.  [place] Patient [place] Phone [phone] Fax [phone] 12/02/2018    10:15 AM

## 2018-12-02 NOTE — Telephone Encounter (Signed)
Oral Oncology Patient Advocate Encounter  Prior Authorization for Pomalyst has been approved.    PA# [id number] Effective dates: 12/02/18 through 08/11/19  Oral Oncology Clinic will continue to follow.   [place] Patient [place] Phone [phone] Fax [phone] 12/02/2018    11:00 AM

## 2018-12-03 MED ORDER — POMALIDOMIDE 1 MG PO CAPS: 1.0000 mg | ORAL_CAPSULE | Freq: Every day | ORAL | 0 refills | Status: DC

## 2018-12-03 NOTE — Telephone Encounter (Signed)
Oral Oncology Pharmacist Encounter  Received notification the insurance authorization for Pomalyst has been approved. Pomalyst prescription has been e-scribed to [facility] in [place], [place] as this is where patient had been receiving her Revlimid. I will follow-up with pharmacy later today to conform reciept of prescription. I will then plan to update patient and perform initial counseling. Planned Pomalyst start date 12/10/18.  [name], PharmD, BCPS, BCOP  12/03/2018 10:33 AM Oral Oncology Clinic [phone]

## 2018-12-03 NOTE — Telephone Encounter (Signed)
Oral Chemotherapy Pharmacist Encounter  I called [facility] to follow-up on new Pomalyst prescription. They have received the Rx. I clarified this was a therapy change with planned start date on 12/10/18. I informed them insurance authorization and new [employer] authorizations have been obtained. I reminded them that they have foundation copayment grant information on file. They will reach out to oral oncology clinic if they have any issues processing patient's prescription.  I spoke with patient for overview of: Pomalyst (pomalidomide) for the treatment of multiple myeloma not having achieved remission in conjunction with dexamethasone, planned duration until disease progression or unacceptable toxicity.   Counseled patient on administration, dosing, side effects, monitoring, drug-food interactions, safe handling, storage, and disposal.  Patient will take Pomalyst 45m capsules, 1 capsule by mouth once daily, without regard to food, with a full glass of water.  Pomalyst will be given 21 days on, 7 days off, repeat every 28 days.  Patient will take dexamethasone 437mtablets, 5 tablets (2049mby mouth once weekly with breakfast. This is currently on hold, but will be restarted once toleration to Pomlayst is confirmed.  Pomalyst start date: 12/10/18  Adverse effects of Pomalyst include but are not limited to: nausea, constipation, diarrhea, abdominal pain, rash, fatigue, drug fever, peripheral edema, and decreased blood counts.    Patient states her previous skin reactions still persists, although it is improving slowly every day. Her blisters are healing. Patient informed that there is some cross-reactivity for rash between Revlimid and Pomalyst. She knows to stop Pomalyst and call the office at the first sign of dermatologic toxicity.  Reviewed with patient importance of keeping a medication schedule and plan for any missed doses.  Mrs. Otten voiced understanding and appreciation.    All questions answered. Medication reconciliation performed and medication/allergy list updated.  Patient counseled on importance of daily aspirin 31m23mr VTE prophylaxis. This medication also remains on hold but will be restarted when she restarts her dexamethasone in conjunction with Pomalyst.  She will be on the lookout for call from [facility] her 1st fill of Pomalyst.  Patient knows to call the office with questions or concerns. Oral Oncology Clinic will continue to follow.  JessJohny DrillingarmD, BCPS, BCOP  12/03/2018   1:18 PM Oral Oncology Clinic [phone]

## 2018-12-06 LAB — MULTIPLE MYELOMA PANEL, SERUM
Albumin SerPl Elph-Mcnc: 3.1 g/dL (ref 2.9–4.4)
Albumin/Glob SerPl: 0.8 (ref 0.7–1.7)
Alpha 1: 0.3 g/dL (ref 0.0–0.4)
Alpha2 Glob SerPl Elph-Mcnc: 0.6 g/dL (ref 0.4–1.0)
B-Globulin SerPl Elph-Mcnc: 0.8 g/dL (ref 0.7–1.3)
Gamma Glob SerPl Elph-Mcnc: 2.3 g/dL — ABNORMAL HIGH (ref 0.4–1.8)
Globulin, Total: 4 g/dL — ABNORMAL HIGH (ref 2.2–3.9)
IgA: 37 mg/dL — ABNORMAL LOW (ref 64–422)
IgG (Immunoglobin G), Serum: 2893 mg/dL — ABNORMAL HIGH (ref 586–1602)
IgM (Immunoglobulin M), Srm: 37 mg/dL (ref 26–217)
M Protein SerPl Elph-Mcnc: 2.1 g/dL — ABNORMAL HIGH
Total Protein ELP: 7.1 g/dL (ref 6.0–8.5)

## 2018-12-06 NOTE — Telephone Encounter (Signed)
Oral Oncology Patient Advocate Encounter  I followed up with [facility] on the Pomalyst prescription. The copay is $0 and they will soon be contacting the patient to schedule shipment.  Will continue to update.  [place] Patient [place] Phone [phone] Fax [phone] 12/06/2018   4:03 PM

## 2018-12-08 NOTE — Telephone Encounter (Signed)
Oral Oncology Patient Advocate Encounter  Confirmed with [facility] that [facility] the [facility] on [address] today for the patient to pick up. The Pomlayst copay is $0.  [place] Patient [place] Phone [phone] Fax [phone] 12/08/2018   11:15 AM

## 2018-12-09 ENCOUNTER — Telehealth: Payer: Self-pay

## 2018-12-09 NOTE — Telephone Encounter (Signed)
Contacted patient and made her aware that she can start the Pomalyst per Dr. Alen Blew. Patient verbalized understanding and had no other questions or concerns.

## 2018-12-09 NOTE — Telephone Encounter (Signed)
-----   Message from Wyatt Portela, MD sent at 12/09/2018 11:28 AM EDT ----- Regarding: RE: patient update and question She can start Pomalyst.  ----- Message ----- From: Scot Dock, RN Sent: 12/09/2018  11:22 AM EDT To: Wyatt Portela, MD Subject: patient update and question                    -She is still having tremors Q am that resolve during the day. -Minor rashes that will pop up, itch, and w/ use of Curel will go away. -Up at night Q 2hrs to urinate  The only med she is taking at this time is prn Ibuprofen. She would like to know if she should start the Pomalyst or wait a few days?

## 2018-12-23 ENCOUNTER — Other Ambulatory Visit: Payer: Self-pay

## 2018-12-23 DIAGNOSIS — C9000 Multiple myeloma not having achieved remission: Secondary | ICD-10-CM

## 2018-12-23 MED ORDER — POMALIDOMIDE 1 MG PO CAPS: 1.0000 mg | ORAL_CAPSULE | Freq: Every day | ORAL | 0 refills | Status: DC

## 2019-01-04 ENCOUNTER — Ambulatory Visit: Payer: Medicare Other | Admitting: Oncology

## 2019-01-04 ENCOUNTER — Other Ambulatory Visit: Payer: Medicare Other

## 2019-01-05 ENCOUNTER — Inpatient Hospital Stay: Payer: Medicare Other | Attending: Oncology

## 2019-01-05 ENCOUNTER — Inpatient Hospital Stay (HOSPITAL_BASED_OUTPATIENT_CLINIC_OR_DEPARTMENT_OTHER): Payer: Medicare Other | Admitting: Oncology

## 2019-01-05 ENCOUNTER — Other Ambulatory Visit: Payer: Self-pay

## 2019-01-05 VITALS — BP 151/83 | HR 72 | Temp 99.1°F | Resp 17 | Ht 66.0 in | Wt 127.2 lb

## 2019-01-05 DIAGNOSIS — D649 Anemia, unspecified: Secondary | ICD-10-CM

## 2019-01-05 DIAGNOSIS — M818 Other osteoporosis without current pathological fracture: Secondary | ICD-10-CM | POA: Diagnosis not present

## 2019-01-05 DIAGNOSIS — C9000 Multiple myeloma not having achieved remission: Secondary | ICD-10-CM

## 2019-01-05 DIAGNOSIS — B029 Zoster without complications: Secondary | ICD-10-CM

## 2019-01-05 DIAGNOSIS — D472 Monoclonal gammopathy: Secondary | ICD-10-CM

## 2019-01-05 LAB — CMP (CANCER CENTER ONLY)
ALT: 24 U/L (ref 0–44)
AST: 24 U/L (ref 15–41)
Albumin: 3.5 g/dL (ref 3.5–5.0)
Alkaline Phosphatase: 48 U/L (ref 38–126)
Anion gap: 5 (ref 5–15)
BUN: 10 mg/dL (ref 8–23)
CO2: 29 mmol/L (ref 22–32)
Calcium: 8.6 mg/dL — ABNORMAL LOW (ref 8.9–10.3)
Chloride: 102 mmol/L (ref 98–111)
Creatinine: 0.8 mg/dL (ref 0.44–1.00)
GFR, Est AFR Am: >60 mL/min (ref >60)
GFR, Estimated: >60 mL/min (ref >60)
Glucose, Bld: 89 mg/dL (ref 70–99)
Potassium: 4.1 mmol/L (ref 3.5–5.1)
Sodium: 136 mmol/L (ref 135–145)
Total Bilirubin: 0.4 mg/dL (ref 0.3–1.2)
Total Protein: 8.8 g/dL — ABNORMAL HIGH (ref 6.5–8.1)

## 2019-01-05 LAB — CBC WITH DIFFERENTIAL (CANCER CENTER ONLY)
Abs Immature Granulocytes: 0.02 10*3/uL (ref 0.00–0.07)
Basophils Absolute: 0.2 10*3/uL — ABNORMAL HIGH (ref 0.0–0.1)
Basophils Relative: 3 %
Eosinophils Absolute: 0.2 10*3/uL (ref 0.0–0.5)
Eosinophils Relative: 3 %
HCT: 38.2 % (ref 36.0–46.0)
Hemoglobin: 12.1 g/dL (ref 12.0–15.0)
Immature Granulocytes: 0 %
Lymphocytes Relative: 36 %
Lymphs Abs: 2.7 10*3/uL (ref 0.7–4.0)
MCH: 30.6 pg (ref 26.0–34.0)
MCHC: 31.7 g/dL (ref 30.0–36.0)
MCV: 96.7 fL (ref 80.0–100.0)
Monocytes Absolute: 0.9 10*3/uL (ref 0.1–1.0)
Monocytes Relative: 12 %
Neutro Abs: 3.5 10*3/uL (ref 1.7–7.7)
Neutrophils Relative %: 46 %
Platelet Count: 380 10*3/uL (ref 150–400)
RBC: 3.95 MIL/uL (ref 3.87–5.11)
RDW: 15.3 % (ref 11.5–15.5)
WBC Count: 7.5 10*3/uL (ref 4.0–10.5)
nRBC: 0 % (ref 0.0–0.2)

## 2019-01-05 NOTE — Progress Notes (Signed)
Hematology and Oncology Follow Up Visit  Nicole Nicole Keith [id number] 1941-03-19 78 y.o. 01/05/2019 12:40 PM Nicole Nicole Keith, [name], [name], MD   Principle Diagnosis: Nicole Nicole Keith with multiple myeloma arising from MGUS diagnosed in December 2019.  He was found to have IgG lambda with anemia.  Past therapy:    Velcade and dexamethasone started weekly on 07/27/2018.  Therapy changed in February 2020.  Revlimid 25 mg for 21 days out of a 28-day cycle with dexamethasone started in February 2020.  Therapy was held after 2 cycles because of severe dermatological toxicity.  Current therapy: Pomalyst 1 mg daily for 21 days started on May 1 of 2020.  She completed her first cycle of therapy.  Interim History:  Nicole Nicole Keith is here for a repeat evaluation.  Since the last visit she started the first cycle of Pomalyst without dexamethasone without any major complications.  She denies any nausea, fatigue or any worsening rash.  She has reported slight decline in her appetite but overall feels reasonably well.  She denies any recent hospitalization or illnesses.   Patient denied any alteration mental status, neuropathy, confusion or dizziness.  Denies any headaches or lethargy.  Denies any night sweats, weight loss or changes in appetite.  Denied orthopnea, dyspnea on exertion or chest discomfort.  Denies shortness of breath, difficulty breathing hemoptysis or cough.  Denies any abdominal distention, nausea, early satiety or dyspepsia.  Denies any hematuria, frequency, dysuria or nocturia.  Denies any skin irritation, dryness or rash.  Denies any ecchymosis or petechiae.  Denies any lymphadenopathy or clotting.  Denies any heat or cold intolerance.  Denies any anxiety or depression.  Remaining review of system is negative.              Medications: I have reviewed the patient's current medications.  Current Outpatient Medications  Medication Sig Dispense Refill  . acetaminophen (TYLENOL)  500 MG tablet Take 500 mg by mouth every 6 (six) hours as needed for moderate pain.    [name] Kitchen acyclovir (ZOVIRAX) 400 MG tablet Take 1 tablet (400 mg total) by mouth 2 (two) times daily. 90 tablet 3  . dexamethasone (DECADRON) 4 MG tablet 5 tablets weekly with chemotherapy (Patient not taking: Reported on 11/10/2018) 100 tablet 3  . fentaNYL (DURAGESIC) 25 MCG/HR Place 1 patch onto the skin every 3 (three) days. (Patient not taking: Reported on 11/16/2018) 10 patch 0  . Olopatadine HCl 0.2 % SOLN INSTILL 1 DROP INTO EACH EYE ONCE DAILY    . oxyCODONE (OXY IR/ROXICODONE) 5 MG immediate release tablet Take 1 tablet (5 mg total) by mouth every 4 (four) hours as needed for severe pain. (Patient not taking: Reported on 11/10/2018) 90 tablet 0  . pomalidomide (POMALYST) 1 MG capsule Take 1 capsule (1 mg total) by mouth daily. Take with water on days 1-21. Repeat every 28 days. 21 capsule 0  . sodium chloride (OCEAN) 0.65 % SOLN nasal spray Place 1 spray into both nostrils as needed for congestion.     No current facility-administered medications for this visit.      Allergies:  Allergies  Allergen Reactions  . Revlimid [Lenalidomide] Rash  . Betadine [Povidone Iodine] Rash  . Flagyl [Metronidazole] Diarrhea  . Fosamax [Alendronate Sodium] Other (See Comments)    GI distress  . Bactrim [Sulfamethoxazole-Trimethoprim]     nausea  . Doxepin Hcl     Irritable, hallucinations  . Hydroxyzine Hcl     hallucinations  . Valium [Diazepam] Other (See Comments)  .  Actonel [Risedronate Sodium] Other (See Comments)    GI distress  . Boniva [Ibandronic Acid] Other (See Comments)    GI distress  . Doxycycline Rash  . Penicillins Rash    Ancef was OK.    Past Medical History, Surgical history, Social history, and Family History were reviewed and updated.   Physical Exam:   Blood pressure (!) 151/83, pulse 72, temperature 99.1 F (37.3 C), temperature source Oral, resp. rate 17, height '5\' 6"'  (1.676 m),  weight 127 lb 3.2 oz (57.7 kg), SpO2 97 %.      ECOG: 1    General appearance: Comfortable appearing without any discomfort Head: Normocephalic without any trauma Oropharynx: Mucous membranes are moist and pink without any thrush or ulcers. Eyes: Pupils are equal and round reactive to light. Lymph nodes: No cervical, supraclavicular, inguinal or axillary lymphadenopathy.   Heart:regular rate and rhythm.  S1 and S2 without leg edema. Lung: Clear without any rhonchi or wheezes.  No dullness to percussion. Abdomin: Soft, nontender, nondistended with good bowel sounds.  No hepatosplenomegaly. Musculoskeletal: No joint deformity or effusion.  Full range of motion noted. Neurological: No deficits noted on motor, sensory and deep tendon reflex exam. Skin: Mild erythema noted on her back and arms.  A large mole noted on right mid axillary line.      Lab Results: Lab Results  Component Value Date   WBC 8.2 12/01/2018   HGB 11.6 (L) 12/01/2018   HCT 36.8 12/01/2018   MCV 97.4 12/01/2018   PLT 396 12/01/2018     Chemistry      Component Value Date/Time   NA 137 12/01/2018 1123   NA 135 (L) 06/11/2017 0753   K 4.1 12/01/2018 1123   K 4.0 06/11/2017 0753   CL 104 12/01/2018 1123   CO2 27 12/01/2018 1123   CO2 26 06/11/2017 0753   BUN 13 12/01/2018 1123   BUN 12.8 06/11/2017 0753   CREATININE 0.79 12/01/2018 1123   CREATININE 0.8 06/11/2017 0753      Component Value Date/Time   CALCIUM 8.1 (L) 12/01/2018 1123   CALCIUM 8.8 06/11/2017 0753   ALKPHOS 60 12/01/2018 1123   ALKPHOS 72 06/11/2017 0753   AST 26 12/01/2018 1123   AST 21 06/11/2017 0753   ALT 28 12/01/2018 1123   ALT 13 06/11/2017 0753   BILITOT 0.4 12/01/2018 1123   BILITOT 0.35 06/11/2017 0753      Results for Nicole, Keith (MRN [id number]) as of 01/05/2019 12:25  Ref. Range 11/10/2018 10:55 12/01/2018 11:23  IgG (Immunoglobin G), Serum Latest Ref Range: 586 - 1,602 mg/dL 3,183 (H) 2,893 (H)   Results  for Nicole, Nicole Keith (MRN [id number]) as of 01/05/2019 12:25  Ref. Range 11/10/2018 10:55 12/01/2018 11:23  M Protein SerPl Elph-Mcnc Latest Ref Range: Not Observed g/dL 2.2 (H) 2.1 (H)     Impression and Plan:  79 year old Nicole Keith with:  1.  Multiple myeloma diagnosed in December 2019.  She was found to have IgG lambda arising from MGUS.  She is currently on Pomalyst June 2 Revlimid rash despite excellent response.  Protein studies in April 2020 were reviewed and showed excellent response to previous treatment with Revlimid with M spike down to 2.1 g/dL and IgG level of 2893.  Risks and benefits of continuing this therapy was discussed today and future complications were reiterated.  2.  Anemia: Related to plasma cell disorder.  Her hemoglobin is back to normal at this time after treatment.  3.  Osteoporosis and compression fracture: She is currently on Zometa which she has been receiving every 3 months.  Next infusion will be in June 2020.  This will be discussed at that time.  She is hesitant to receive it as she suspect that could be contributing to her rash.  4.  Herpes zoster reactivation: I recommended continuing acyclovir at this time.  5.  Back pain: Manageable at this time without any major pain medication.  6.  Antiemetics: No issues with nausea or vomiting reported at this time.  Antiemetics are available to her..  7.  Rash: Resolving at this time.  Likely related to Revlimid.  I recommended evaluation by dermatology for an enlarging mole.  8. Follow-up: On June 17 of 2020 for repeat evaluation.  25  minutes was spent with the patient face-to-face today.  More than 50% of time was spent on reviewing her disease status, treatment options and complications related therapy.  Zola Button, MD 5/27/202012:40 PM

## 2019-01-06 ENCOUNTER — Telehealth: Payer: Self-pay | Admitting: Oncology

## 2019-01-06 LAB — KAPPA/LAMBDA LIGHT CHAINS
Kappa free light chain: 13.2 mg/L (ref 3.3–19.4)
Kappa, lambda light chain ratio: 0.16 — ABNORMAL LOW (ref 0.26–1.65)
Lambda free light chains: 81.7 mg/L — ABNORMAL HIGH (ref 5.7–26.3)

## 2019-01-06 NOTE — Telephone Encounter (Signed)
Tried to reach regarding schedule °

## 2019-01-07 LAB — MULTIPLE MYELOMA PANEL, SERUM
Albumin SerPl Elph-Mcnc: 3.5 g/dL (ref 2.9–4.4)
Albumin/Glob SerPl: 0.8 (ref 0.7–1.7)
Alpha 1: 0.3 g/dL (ref 0.0–0.4)
Alpha2 Glob SerPl Elph-Mcnc: 0.7 g/dL (ref 0.4–1.0)
B-Globulin SerPl Elph-Mcnc: 0.9 g/dL (ref 0.7–1.3)
Gamma Glob SerPl Elph-Mcnc: 2.9 g/dL — ABNORMAL HIGH (ref 0.4–1.8)
Globulin, Total: 4.8 g/dL — ABNORMAL HIGH (ref 2.2–3.9)
IgA: 51 mg/dL — ABNORMAL LOW (ref 64–422)
IgG (Immunoglobin G), Serum: 3358 mg/dL — ABNORMAL HIGH (ref 586–1602)
IgM (Immunoglobulin M), Srm: 53 mg/dL (ref 26–217)
M Protein SerPl Elph-Mcnc: 2.3 g/dL — ABNORMAL HIGH
Total Protein ELP: 8.3 g/dL (ref 6.0–8.5)

## 2019-01-12 ENCOUNTER — Ambulatory Visit: Payer: Medicare Other

## 2019-01-12 ENCOUNTER — Other Ambulatory Visit: Payer: Medicare Other

## 2019-01-18 ENCOUNTER — Other Ambulatory Visit: Payer: Self-pay

## 2019-01-18 DIAGNOSIS — C9000 Multiple myeloma not having achieved remission: Secondary | ICD-10-CM

## 2019-01-18 MED ORDER — POMALIDOMIDE 1 MG PO CAPS: 1.0000 mg | ORAL_CAPSULE | Freq: Every day | ORAL | 0 refills | Status: DC

## 2019-01-26 ENCOUNTER — Other Ambulatory Visit: Payer: Self-pay

## 2019-01-26 ENCOUNTER — Inpatient Hospital Stay (HOSPITAL_BASED_OUTPATIENT_CLINIC_OR_DEPARTMENT_OTHER): Payer: Medicare Other | Admitting: Oncology

## 2019-01-26 ENCOUNTER — Inpatient Hospital Stay: Payer: Medicare Other | Attending: Oncology

## 2019-01-26 ENCOUNTER — Inpatient Hospital Stay: Payer: Medicare Other

## 2019-01-26 VITALS — BP 141/73 | HR 77 | Temp 98.7°F | Resp 17 | Ht 66.0 in | Wt 130.3 lb

## 2019-01-26 DIAGNOSIS — M4850XA Collapsed vertebra, not elsewhere classified, site unspecified, initial encounter for fracture: Secondary | ICD-10-CM | POA: Diagnosis not present

## 2019-01-26 DIAGNOSIS — Z79899 Other long term (current) drug therapy: Secondary | ICD-10-CM | POA: Diagnosis not present

## 2019-01-26 DIAGNOSIS — D649 Anemia, unspecified: Secondary | ICD-10-CM | POA: Insufficient documentation

## 2019-01-26 DIAGNOSIS — Z85820 Personal history of malignant melanoma of skin: Secondary | ICD-10-CM | POA: Insufficient documentation

## 2019-01-26 DIAGNOSIS — M549 Dorsalgia, unspecified: Secondary | ICD-10-CM

## 2019-01-26 DIAGNOSIS — C9000 Multiple myeloma not having achieved remission: Secondary | ICD-10-CM

## 2019-01-26 DIAGNOSIS — M810 Age-related osteoporosis without current pathological fracture: Secondary | ICD-10-CM

## 2019-01-26 DIAGNOSIS — D472 Monoclonal gammopathy: Secondary | ICD-10-CM

## 2019-01-26 LAB — CMP (CANCER CENTER ONLY)
ALT: 14 U/L (ref 0–44)
AST: 17 U/L (ref 15–41)
Albumin: 3.3 g/dL — ABNORMAL LOW (ref 3.5–5.0)
Alkaline Phosphatase: 49 U/L (ref 38–126)
Anion gap: 8 (ref 5–15)
BUN: 14 mg/dL (ref 8–23)
CO2: 24 mmol/L (ref 22–32)
Calcium: 8.4 mg/dL — ABNORMAL LOW (ref 8.9–10.3)
Chloride: 105 mmol/L (ref 98–111)
Creatinine: 0.84 mg/dL (ref 0.44–1.00)
GFR, Est AFR Am: >60 mL/min (ref >60)
GFR, Estimated: >60 mL/min (ref >60)
Glucose, Bld: 153 mg/dL — ABNORMAL HIGH (ref 70–99)
Potassium: 4 mmol/L (ref 3.5–5.1)
Sodium: 137 mmol/L (ref 135–145)
Total Bilirubin: 0.5 mg/dL (ref 0.3–1.2)
Total Protein: 8.4 g/dL — ABNORMAL HIGH (ref 6.5–8.1)

## 2019-01-26 LAB — CBC WITH DIFFERENTIAL (CANCER CENTER ONLY)
Abs Immature Granulocytes: 0.06 10*3/uL (ref 0.00–0.07)
Basophils Absolute: 0.2 10*3/uL — ABNORMAL HIGH (ref 0.0–0.1)
Basophils Relative: 2 %
Eosinophils Absolute: 0.4 10*3/uL (ref 0.0–0.5)
Eosinophils Relative: 5 %
HCT: 36.8 % (ref 36.0–46.0)
Hemoglobin: 11.7 g/dL — ABNORMAL LOW (ref 12.0–15.0)
Immature Granulocytes: 1 %
Lymphocytes Relative: 23 %
Lymphs Abs: 1.9 10*3/uL (ref 0.7–4.0)
MCH: 30.7 pg (ref 26.0–34.0)
MCHC: 31.8 g/dL (ref 30.0–36.0)
MCV: 96.6 fL (ref 80.0–100.0)
Monocytes Absolute: 0.9 10*3/uL (ref 0.1–1.0)
Monocytes Relative: 11 %
Neutro Abs: 5 10*3/uL (ref 1.7–7.7)
Neutrophils Relative %: 58 %
Platelet Count: 257 10*3/uL (ref 150–400)
RBC: 3.81 MIL/uL — ABNORMAL LOW (ref 3.87–5.11)
RDW: 15.2 % (ref 11.5–15.5)
WBC Count: 8.5 10*3/uL (ref 4.0–10.5)
nRBC: 0 % (ref 0.0–0.2)

## 2019-01-26 NOTE — Progress Notes (Signed)
Hematology and Oncology Follow Up Visit  Nicole Keith [id number] [birth year] 78 y.o. 01/26/2019 11:32 AM Nicole Keith, [name], Nicole Lies, MD   Principle Diagnosis: 78 year old woman with IgG lambda multiple myeloma diagnosed in December 2019.  She was found to have MGUS since 2016.  Past therapy:    Velcade and dexamethasone started weekly on 07/27/2018.  Therapy changed in February 2020.  Revlimid 25 mg for 21 days out of a 28-day cycle with dexamethasone started in February 2020.  Therapy was held after 2 cycles because of severe dermatological toxicity.  Current therapy: Pomalyst 1 mg daily for 21 days started on May 1 of 2020.  She is status post 2 cycles of therapy.  Interim History:  Nicole Keith returns today for a repeat evaluation.  Since the last visit, she completed the second cycle of Pomalyst without complications.  She denies any nausea, fatigue or tiredness.  She denies any skin rashes or irritation.  She did have a squamous cell carcinoma removed from her right mid axillary line.  She denies any recent hospitalization or illnesses.  Her pain has been very limited without any recent exacerbation.   She denied headaches, blurry vision, syncope or seizures.  Denies any fevers, chills or sweats.  Denied chest pain, palpitation, orthopnea or leg edema.  Denied cough, wheezing or hemoptysis.  Denied nausea, vomiting or abdominal pain.  Denies any constipation or diarrhea.  Denies any frequency urgency or hesitancy.  Denies any arthralgias or myalgias.  Denies any skin rashes or lesions.  Denies any bleeding or clotting tendency.  Denies any easy bruising.  Denies any hair or nail changes.  Denies any anxiety or depression.  Remaining review of system is negative.                Medications: I have reviewed the patient's current medications.  Current Outpatient Medications  Medication Sig Dispense Refill  . acetaminophen (TYLENOL) 500 MG tablet Take 500 mg by mouth  every 6 (six) hours as needed for moderate pain.    [name] Kitchen acyclovir (ZOVIRAX) 400 MG tablet Take 1 tablet (400 mg total) by mouth 2 (two) times daily. 90 tablet 3  . dexamethasone (DECADRON) 4 MG tablet 5 tablets weekly with chemotherapy (Patient not taking: Reported on 11/10/2018) 100 tablet 3  . fentaNYL (DURAGESIC) 25 MCG/HR Place 1 patch onto the skin every 3 (three) days. (Patient not taking: Reported on 11/16/2018) 10 patch 0  . Olopatadine HCl 0.2 % SOLN INSTILL 1 DROP INTO EACH EYE ONCE DAILY    . oxyCODONE (OXY IR/ROXICODONE) 5 MG immediate release tablet Take 1 tablet (5 mg total) by mouth every 4 (four) hours as needed for severe pain. (Patient not taking: Reported on 11/10/2018) 90 tablet 0  . pomalidomide (POMALYST) 1 MG capsule Take 1 capsule (1 mg total) by mouth daily. Take with water on days 1-21. Repeat every 28 days. 21 capsule 0  . sodium chloride (OCEAN) 0.65 % SOLN nasal spray Place 1 spray into both nostrils as needed for congestion.     No current facility-administered medications for this visit.      Allergies:  Allergies  Allergen Reactions  . Revlimid [Lenalidomide] Rash  . Betadine [Povidone Iodine] Rash  . Flagyl [Metronidazole] Diarrhea  . Fosamax [Alendronate Sodium] Other (See Comments)    GI distress  . Bactrim [Sulfamethoxazole-Trimethoprim]     nausea  . Doxepin Hcl     Irritable, hallucinations  . Hydroxyzine Hcl     hallucinations  .  Valium [Diazepam] Other (See Comments)  . Actonel [Risedronate Sodium] Other (See Comments)    GI distress  . Boniva [Ibandronic Acid] Other (See Comments)    GI distress  . Doxycycline Rash  . Penicillins Rash    Ancef was OK.    Past Medical History, Surgical history, Social history, and Family History were reviewed and updated.   Physical Exam:   Blood pressure (!) 141/73, pulse 77, temperature 98.7 F (37.1 C), temperature source Oral, resp. rate 17, height _0  (1.676 m), weight 130 lb 4.8 oz (59.1 kg), SpO2 99  %.      ECOG: 1    General appearance: Alert, awake without any distress. Head: Atraumatic without abnormalities Oropharynx: Without any thrush or ulcers. Eyes: No scleral icterus. Lymph nodes: No lymphadenopathy noted in the cervical, supraclavicular, or axillary nodes Heart:regular rate and rhythm, without any murmurs or gallops.   Lung: Clear to auscultation without any rhonchi, wheezes or dullness to percussion. Abdomin: Soft, nontender without any shifting dullness or ascites. Musculoskeletal: No clubbing or cyanosis. Neurological: No motor or sensory deficits. Skin: Mild erythema noted on her back.  Well-healed lesion noted from her dermatological surgery in the right mid axillary line.       Lab Results: Lab Results  Component Value Date   WBC 8.5 01/26/2019   HGB 11.7 (L) 01/26/2019   HCT 36.8 01/26/2019   MCV 96.6 01/26/2019   PLT 257 01/26/2019     Chemistry      Component Value Date/Time   NA 136 01/05/2019 1227   NA 135 (L) 06/11/2017 0753   K 4.1 01/05/2019 1227   K 4.0 06/11/2017 0753   CL 102 01/05/2019 1227   CO2 29 01/05/2019 1227   CO2 26 06/11/2017 0753   BUN 10 01/05/2019 1227   BUN 12.8 06/11/2017 0753   CREATININE 0.80 01/05/2019 1227   CREATININE 0.8 06/11/2017 0753      Component Value Date/Time   CALCIUM 8.6 (L) 01/05/2019 1227   CALCIUM 8.8 06/11/2017 0753   ALKPHOS 48 01/05/2019 1227   ALKPHOS 72 06/11/2017 0753   AST 24 01/05/2019 1227   AST 21 06/11/2017 0753   ALT 24 01/05/2019 1227   ALT 13 06/11/2017 0753   BILITOT 0.4 01/05/2019 1227   BILITOT 0.35 06/11/2017 0753      Results for [name], [name] (MRN [id number]) as of 01/26/2019 11:20  Ref. Range 12/01/2018 11:23 01/05/2019 12:28  M Protein SerPl Elph-Mcnc Latest Ref Range: Not Observed g/dL 2.1 (H) 2.3 (H)      Impression and Plan:  78 year old woman with:  1.  IgG lambda multiple myeloma diagnosed in December 2019 arising from MGUS.  She continues to  tolerate Pomalyst at 1 mg daily without any complications.  Risks and benefits of continuing this treatment versus escalating the dose and adding dexamethasone was discussed.  For the time being she would like to keep it at a lower dose for better tolerance.  Her M protein on Jan 05, 2019 showed overall stable disease.  For the time being we will continue to keep her in the same dose and schedule and monitor protein studies periodically.  2.  Anemia: Hemoglobin close to normal range.  Her anemia is related to plasma cell disorder.  3. Osteoporosis and compression fracture: She has been on Zometa in the past however this therapy will be on hold based on her recommendation.  4.  Herpes zoster reactivation: Continues to be on acyclovir prophylaxis.  No evidence of reactivation noted.  5.  Back pain: Very little pain noted at this time.  6.  Antiemetics: No nausea or vomiting reported on Pomalyst.  7.  Rash: Resolved at this time related to Revlimid.  8.  Squamous cell carcinoma of the skin: Status post surgical resection without any additional treatment needed.  She has follow-up with dermatology.  9. Follow-up: We will be in 4 weeks for repeat evaluation.  25  minutes was spent with the patient face-to-face today.  More than 50% of time was spent on discussing her disease status, treatment options and complications related therapy.  Zola Button, MD 6/17/202011:32 AM

## 2019-01-27 ENCOUNTER — Telehealth: Payer: Self-pay | Admitting: Oncology

## 2019-01-27 LAB — KAPPA/LAMBDA LIGHT CHAINS
Kappa free light chain: 16.8 mg/L (ref 3.3–19.4)
Kappa, lambda light chain ratio: 0.24 — ABNORMAL LOW (ref 0.26–1.65)
Lambda free light chains: 69.5 mg/L — ABNORMAL HIGH (ref 5.7–26.3)

## 2019-01-27 NOTE — Telephone Encounter (Signed)
No 6/17 los  °

## 2019-01-28 ENCOUNTER — Telehealth: Payer: Self-pay

## 2019-01-28 LAB — MULTIPLE MYELOMA PANEL, SERUM
Albumin SerPl Elph-Mcnc: 3.1 g/dL (ref 2.9–4.4)
Albumin/Glob SerPl: 0.8 (ref 0.7–1.7)
Alpha 1: 0.3 g/dL (ref 0.0–0.4)
Alpha2 Glob SerPl Elph-Mcnc: 0.7 g/dL (ref 0.4–1.0)
B-Globulin SerPl Elph-Mcnc: 0.9 g/dL (ref 0.7–1.3)
Gamma Glob SerPl Elph-Mcnc: 2.5 g/dL — ABNORMAL HIGH (ref 0.4–1.8)
Globulin, Total: 4.4 g/dL — ABNORMAL HIGH (ref 2.2–3.9)
IgA: 56 mg/dL — ABNORMAL LOW (ref 64–422)
IgG (Immunoglobin G), Serum: 3003 mg/dL — ABNORMAL HIGH (ref 586–1602)
IgM (Immunoglobulin M), Srm: 51 mg/dL (ref 26–217)
M Protein SerPl Elph-Mcnc: 2.2 g/dL — ABNORMAL HIGH
Total Protein ELP: 7.5 g/dL (ref 6.0–8.5)

## 2019-01-28 NOTE — Telephone Encounter (Signed)
-----   Message from Wyatt Portela, MD sent at 01/28/2019  9:52 AM EDT ----- Please let her know her protein studies are improving. IgG level and M protein are examples.

## 2019-01-28 NOTE — Telephone Encounter (Signed)
Contacted patient and left message with results and to call back with any questions or concerns. 

## 2019-02-16 ENCOUNTER — Other Ambulatory Visit: Payer: Self-pay

## 2019-02-16 DIAGNOSIS — C9000 Multiple myeloma not having achieved remission: Secondary | ICD-10-CM

## 2019-02-16 MED ORDER — POMALIDOMIDE 1 MG PO CAPS: 1.0000 mg | ORAL_CAPSULE | Freq: Every day | ORAL | 0 refills | Status: DC

## 2019-02-24 ENCOUNTER — Inpatient Hospital Stay (HOSPITAL_BASED_OUTPATIENT_CLINIC_OR_DEPARTMENT_OTHER): Payer: Medicare Other | Admitting: Oncology

## 2019-02-24 ENCOUNTER — Other Ambulatory Visit: Payer: Self-pay

## 2019-02-24 ENCOUNTER — Inpatient Hospital Stay: Payer: Medicare Other | Attending: Oncology

## 2019-02-24 VITALS — BP 140/70 | HR 71 | Temp 98.7°F | Resp 17 | Ht 66.0 in | Wt 132.3 lb

## 2019-02-24 DIAGNOSIS — R21 Rash and other nonspecific skin eruption: Secondary | ICD-10-CM | POA: Diagnosis not present

## 2019-02-24 DIAGNOSIS — C9000 Multiple myeloma not having achieved remission: Secondary | ICD-10-CM | POA: Insufficient documentation

## 2019-02-24 DIAGNOSIS — Z85820 Personal history of malignant melanoma of skin: Secondary | ICD-10-CM | POA: Diagnosis not present

## 2019-02-24 DIAGNOSIS — Z79899 Other long term (current) drug therapy: Secondary | ICD-10-CM

## 2019-02-24 DIAGNOSIS — D472 Monoclonal gammopathy: Secondary | ICD-10-CM

## 2019-02-24 DIAGNOSIS — D649 Anemia, unspecified: Secondary | ICD-10-CM

## 2019-02-24 DIAGNOSIS — M4850XA Collapsed vertebra, not elsewhere classified, site unspecified, initial encounter for fracture: Secondary | ICD-10-CM | POA: Diagnosis not present

## 2019-02-24 DIAGNOSIS — M810 Age-related osteoporosis without current pathological fracture: Secondary | ICD-10-CM | POA: Diagnosis not present

## 2019-02-24 LAB — CBC WITH DIFFERENTIAL (CANCER CENTER ONLY)
Abs Immature Granulocytes: 0.03 K/uL (ref 0.00–0.07)
Basophils Absolute: 0.2 K/uL — ABNORMAL HIGH (ref 0.0–0.1)
Basophils Relative: 2 %
Eosinophils Absolute: 0.5 K/uL (ref 0.0–0.5)
Eosinophils Relative: 8 %
HCT: 36.4 % (ref 36.0–46.0)
Hemoglobin: 11.9 g/dL — ABNORMAL LOW (ref 12.0–15.0)
Immature Granulocytes: 0 %
Lymphocytes Relative: 28 %
Lymphs Abs: 1.9 K/uL (ref 0.7–4.0)
MCH: 31.2 pg (ref 26.0–34.0)
MCHC: 32.7 g/dL (ref 30.0–36.0)
MCV: 95.3 fL (ref 80.0–100.0)
Monocytes Absolute: 0.9 K/uL (ref 0.1–1.0)
Monocytes Relative: 13 %
Neutro Abs: 3.4 K/uL (ref 1.7–7.7)
Neutrophils Relative %: 49 %
Platelet Count: 214 K/uL (ref 150–400)
RBC: 3.82 MIL/uL — ABNORMAL LOW (ref 3.87–5.11)
RDW: 14.6 % (ref 11.5–15.5)
WBC Count: 6.8 K/uL (ref 4.0–10.5)
nRBC: 0 % (ref 0.0–0.2)

## 2019-02-24 LAB — CMP (CANCER CENTER ONLY)
ALT: 16 U/L (ref 0–44)
AST: 19 U/L (ref 15–41)
Albumin: 3.2 g/dL — ABNORMAL LOW (ref 3.5–5.0)
Alkaline Phosphatase: 44 U/L (ref 38–126)
Anion gap: 8 (ref 5–15)
BUN: 12 mg/dL (ref 8–23)
CO2: 23 mmol/L (ref 22–32)
Calcium: 8.2 mg/dL — ABNORMAL LOW (ref 8.9–10.3)
Chloride: 106 mmol/L (ref 98–111)
Creatinine: 0.77 mg/dL (ref 0.44–1.00)
GFR, Est AFR Am: >60 mL/min (ref >60)
GFR, Estimated: >60 mL/min (ref >60)
Glucose, Bld: 115 mg/dL — ABNORMAL HIGH (ref 70–99)
Potassium: 4.3 mmol/L (ref 3.5–5.1)
Sodium: 137 mmol/L (ref 135–145)
Total Bilirubin: 0.5 mg/dL (ref 0.3–1.2)
Total Protein: 8.1 g/dL (ref 6.5–8.1)

## 2019-02-24 NOTE — Progress Notes (Signed)
Hematology and Oncology Follow Up Visit  Nicole Keith [id number] 03-25-1941 78 y.o. 02/24/2019 12:14 PM [name], [name], [name], MD   Principle Diagnosis: 78 year old woman with multiple myeloma diagnosed in December 2019.  She was found to have IgG lambda subtype arising from MGUS that was diagnosed in 2016.  Past therapy:    Velcade and dexamethasone started weekly on 07/27/2018.  Therapy changed in February 2020.  Revlimid 25 mg for 21 days out of a 28-day cycle with dexamethasone started in February 2020.  Therapy was held after 2 cycles because of severe dermatological toxicity.  Current therapy: Pomalyst 1 mg daily for 21 days started on May 1 of 2020.  She is status post 3 cycles of therapy.  Interim History:  Mrs. Nicole Keith is here for a repeat evaluation.  Since the last visit, she continues to notice improvement in her overall health and no complications related to Pomalyst.  She is eating better and has gained weight and her rash has improved at this time.  She denies any recent bone pain or pathological fractures.  She denies any recent hospitalization or illnesses.  Her mobility continues to improve slowly at this time.    She denied any alteration mental status, neuropathy, confusion or dizziness.  Denies any headaches or lethargy.  Denies any night sweats, weight loss or changes in appetite.  Denied orthopnea, dyspnea on exertion or chest discomfort.  Denies shortness of breath, difficulty breathing hemoptysis or cough.  Denies any abdominal distention, nausea, early satiety or dyspepsia.  Denies any hematuria, frequency, dysuria or nocturia.  Denies any skin irritation, dryness or rash.  Denies any ecchymosis or petechiae.  Denies any lymphadenopathy or clotting.  Denies any heat or cold intolerance.  Denies any anxiety or depression.  Remaining review of system is negative.                     Medications: Unchanged by my review. Current Outpatient  Medications  Medication Sig Dispense Refill  . acetaminophen (TYLENOL) 500 MG tablet Take 500 mg by mouth every 6 (six) hours as needed for moderate pain.    [name] Kitchen acyclovir (ZOVIRAX) 400 MG tablet Take 1 tablet (400 mg total) by mouth 2 (two) times daily. 90 tablet 3  . dexamethasone (DECADRON) 4 MG tablet 5 tablets weekly with chemotherapy (Patient not taking: Reported on 11/10/2018) 100 tablet 3  . fentaNYL (DURAGESIC) 25 MCG/HR Place 1 patch onto the skin every 3 (three) days. (Patient not taking: Reported on 11/16/2018) 10 patch 0  . Olopatadine HCl 0.2 % SOLN INSTILL 1 DROP INTO EACH EYE ONCE DAILY    . oxyCODONE (OXY IR/ROXICODONE) 5 MG immediate release tablet Take 1 tablet (5 mg total) by mouth every 4 (four) hours as needed for severe pain. (Patient not taking: Reported on 11/10/2018) 90 tablet 0  . pomalidomide (POMALYST) 1 MG capsule Take 1 capsule (1 mg total) by mouth daily. Take with water on days 1-21. Repeat every 28 days. 21 capsule 0  . sodium chloride (OCEAN) 0.65 % SOLN nasal spray Place 1 spray into both nostrils as needed for congestion.     No current facility-administered medications for this visit.      Allergies:  Allergies  Allergen Reactions  . Revlimid [Lenalidomide] Rash  . Betadine [Povidone Iodine] Rash  . Flagyl [Metronidazole] Diarrhea  . Fosamax [Alendronate Sodium] Other (See Comments)    GI distress  . Bactrim [Sulfamethoxazole-Trimethoprim]     nausea  . Doxepin  Hcl     Irritable, hallucinations  . Hydroxyzine Hcl     hallucinations  . Valium [Diazepam] Other (See Comments)  . Actonel [Risedronate Sodium] Other (See Comments)    GI distress  . Boniva [Ibandronic Acid] Other (See Comments)    GI distress  . Doxycycline Rash  . Penicillins Rash    Ancef was OK.    Past Medical History, Surgical history, Social history, and Family History remains unchanged by my review.   Physical Exam:   Blood pressure 140/70, pulse 71, temperature 98.7 F (37.1  C), temperature source Oral, resp. rate 17, height '5\' 6"'  (1.676 m), weight 132 lb 4.8 oz (60 kg), SpO2 99 %.      ECOG: 1    General appearance: Comfortable appearing without any discomfort Head: Normocephalic without any trauma Oropharynx: Mucous membranes are moist and pink without any thrush or ulcers. Eyes: Pupils are equal and round reactive to light. Lymph nodes: No cervical, supraclavicular, inguinal or axillary lymphadenopathy.   Heart:regular rate and rhythm.  S1 and S2 without leg edema. Lung: Clear without any rhonchi or wheezes.  No dullness to percussion. Abdomin: Soft, nontender, nondistended with good bowel sounds.  No hepatosplenomegaly. Musculoskeletal: No joint deformity or effusion.  Full range of motion noted. Neurological: No deficits noted on motor, sensory and deep tendon reflex exam. Skin: Mild erythema noted on her arms and back.      Lab Results: Lab Results  Component Value Date   WBC 6.8 02/24/2019   HGB 11.9 (L) 02/24/2019   HCT 36.4 02/24/2019   MCV 95.3 02/24/2019   PLT 214 02/24/2019     Chemistry      Component Value Date/Time   NA 137 02/24/2019 1107   NA 135 (L) 06/11/2017 0753   K 4.3 02/24/2019 1107   K 4.0 06/11/2017 0753   CL 106 02/24/2019 1107   CO2 23 02/24/2019 1107   CO2 26 06/11/2017 0753   BUN 12 02/24/2019 1107   BUN 12.8 06/11/2017 0753   CREATININE 0.77 02/24/2019 1107   CREATININE 0.8 06/11/2017 0753      Component Value Date/Time   CALCIUM 8.2 (L) 02/24/2019 1107   CALCIUM 8.8 06/11/2017 0753   ALKPHOS 44 02/24/2019 1107   ALKPHOS 72 06/11/2017 0753   AST 19 02/24/2019 1107   AST 21 06/11/2017 0753   ALT 16 02/24/2019 1107   ALT 13 06/11/2017 0753   BILITOT 0.5 02/24/2019 1107   BILITOT 0.35 06/11/2017 0753       Results for KAMBER, VIGNOLA (MRN [id number]) as of 02/24/2019 12:17  Ref. Range 12/01/2018 11:23 01/05/2019 12:28 01/26/2019 10:57  M Protein SerPl Elph-Mcnc Latest Ref Range: Not Observed  g/dL 2.1 (H) 2.3 (H) 2.2 (H)   Results for RHAELYN, GIRON (MRN [id number]) as of 02/24/2019 12:17  Ref. Range 12/01/2018 11:23 01/05/2019 12:28 01/26/2019 10:57  IgG (Immunoglobin G), Serum Latest Ref Range: 586 - 1,602 mg/dL 2,893 (H) 3,358 (H) 3,003 (H)     Impression and Plan:  78 year old woman with:  1.  Multiple myeloma diagnosed in 2019 arising from IgG lambda MGUS.    She has tolerated Pomalyst at this time without any major complications.  Protein studies on 01/26/2019 were reviewed and continues to show positive response to therapy with decline in her IgG level and M spike.  Risks and benefits of continuing this approach was discussed.  Potential complication associated with this medication as well as increased dose was also reviewed.  At this time she is willing to continue at the current dose without any dose escalation.  We will continue to monitor protein studies accordingly.  2.  Anemia: Related to plasma cell disorder and hemoglobin continues to improve at this time.  3. Osteoporosis and compression fracture: She has received Zometa although declines to repeated at this time.  We will continue to address that with her in the future.  4.  Herpes zoster reactivation: He remains on acyclovir for prophylaxis.  This will be discontinued future.  5.  Back pain: Resolved at this time without any issues.  6.  Rash: Continues to improve after stopping Revlimid.  7.  Squamous cell carcinoma of the skin: She is status post resection by dermatology.  I urged her to continue to follow with dermatology regularly.  8. Follow-up: In 1 month after completing the next cycle of therapy for repeat evaluation.  25  minutes was spent with the patient face-to-face today.  More than 50% of time was dedicated to reviewing laboratory data, discussing her disease status, treatment options and answering questions regarding future plan of care. Zola Button, MD 7/16/202012:14 PM

## 2019-02-25 LAB — KAPPA/LAMBDA LIGHT CHAINS
Kappa free light chain: 15.2 mg/L (ref 3.3–19.4)
Kappa, lambda light chain ratio: 0.25 — ABNORMAL LOW (ref 0.26–1.65)
Lambda free light chains: 60 mg/L — ABNORMAL HIGH (ref 5.7–26.3)

## 2019-02-27 LAB — MULTIPLE MYELOMA PANEL, SERUM
Albumin SerPl Elph-Mcnc: 3.4 g/dL (ref 2.9–4.4)
Albumin/Glob SerPl: 0.9 (ref 0.7–1.7)
Alpha 1: 0.2 g/dL (ref 0.0–0.4)
Alpha2 Glob SerPl Elph-Mcnc: 0.5 g/dL (ref 0.4–1.0)
B-Globulin SerPl Elph-Mcnc: 0.8 g/dL (ref 0.7–1.3)
Gamma Glob SerPl Elph-Mcnc: 2.6 g/dL — ABNORMAL HIGH (ref 0.4–1.8)
Globulin, Total: 4.2 g/dL — ABNORMAL HIGH (ref 2.2–3.9)
IgA: 62 mg/dL — ABNORMAL LOW (ref 64–422)
IgG (Immunoglobin G), Serum: 3253 mg/dL — ABNORMAL HIGH (ref 586–1602)
IgM (Immunoglobulin M), Srm: 52 mg/dL (ref 26–217)
M Protein SerPl Elph-Mcnc: 2.3 g/dL — ABNORMAL HIGH
Total Protein ELP: 7.6 g/dL (ref 6.0–8.5)

## 2019-02-28 ENCOUNTER — Telehealth: Payer: Self-pay | Admitting: Oncology

## 2019-02-28 NOTE — Telephone Encounter (Signed)
Called and left msg. Mailed printout  °

## 2019-03-23 ENCOUNTER — Other Ambulatory Visit: Payer: Self-pay

## 2019-03-23 ENCOUNTER — Inpatient Hospital Stay: Payer: Medicare Other | Attending: Oncology | Admitting: Oncology

## 2019-03-23 ENCOUNTER — Inpatient Hospital Stay: Payer: Medicare Other

## 2019-03-23 VITALS — BP 139/72 | HR 80 | Temp 98.2°F | Resp 18 | Ht 66.0 in | Wt 132.4 lb

## 2019-03-23 DIAGNOSIS — Z79899 Other long term (current) drug therapy: Secondary | ICD-10-CM | POA: Insufficient documentation

## 2019-03-23 DIAGNOSIS — D649 Anemia, unspecified: Secondary | ICD-10-CM | POA: Insufficient documentation

## 2019-03-23 DIAGNOSIS — C9000 Multiple myeloma not having achieved remission: Secondary | ICD-10-CM

## 2019-03-23 DIAGNOSIS — D472 Monoclonal gammopathy: Secondary | ICD-10-CM

## 2019-03-23 LAB — CBC WITH DIFFERENTIAL (CANCER CENTER ONLY)
Abs Immature Granulocytes: 0.05 10*3/uL (ref 0.00–0.07)
Basophils Absolute: 0.2 10*3/uL — ABNORMAL HIGH (ref 0.0–0.1)
Basophils Relative: 2 %
Eosinophils Absolute: 0.5 10*3/uL (ref 0.0–0.5)
Eosinophils Relative: 6 %
HCT: 37.5 % (ref 36.0–46.0)
Hemoglobin: 12.2 g/dL (ref 12.0–15.0)
Immature Granulocytes: 1 %
Lymphocytes Relative: 22 %
Lymphs Abs: 1.8 10*3/uL (ref 0.7–4.0)
MCH: 31.4 pg (ref 26.0–34.0)
MCHC: 32.5 g/dL (ref 30.0–36.0)
MCV: 96.6 fL (ref 80.0–100.0)
Monocytes Absolute: 1.1 10*3/uL — ABNORMAL HIGH (ref 0.1–1.0)
Monocytes Relative: 14 %
Neutro Abs: 4.4 10*3/uL (ref 1.7–7.7)
Neutrophils Relative %: 55 %
Platelet Count: 251 10*3/uL (ref 150–400)
RBC: 3.88 MIL/uL (ref 3.87–5.11)
RDW: 14.4 % (ref 11.5–15.5)
WBC Count: 8 10*3/uL (ref 4.0–10.5)
nRBC: 0 % (ref 0.0–0.2)

## 2019-03-23 LAB — CMP (CANCER CENTER ONLY)
ALT: 20 U/L (ref 0–44)
AST: 23 U/L (ref 15–41)
Albumin: 3.3 g/dL — ABNORMAL LOW (ref 3.5–5.0)
Alkaline Phosphatase: 50 U/L (ref 38–126)
Anion gap: 7 (ref 5–15)
BUN: 13 mg/dL (ref 8–23)
CO2: 27 mmol/L (ref 22–32)
Calcium: 8.9 mg/dL (ref 8.9–10.3)
Chloride: 102 mmol/L (ref 98–111)
Creatinine: 0.84 mg/dL (ref 0.44–1.00)
GFR, Est AFR Am: >60 mL/min (ref >60)
GFR, Estimated: >60 mL/min (ref >60)
Glucose, Bld: 111 mg/dL — ABNORMAL HIGH (ref 70–99)
Potassium: 4.2 mmol/L (ref 3.5–5.1)
Sodium: 136 mmol/L (ref 135–145)
Total Bilirubin: 0.4 mg/dL (ref 0.3–1.2)
Total Protein: 8.2 g/dL — ABNORMAL HIGH (ref 6.5–8.1)

## 2019-03-23 MED ORDER — METHYLPREDNISOLONE 4 MG PO TBPK: ORAL_TABLET | ORAL | 0 refills | Status: DC

## 2019-03-23 NOTE — Progress Notes (Signed)
Hematology and Oncology Follow Up Visit  Nicole Keith [id number] October 03, 1940 78 y.o. 03/23/2019 11:52 AM [name], [name], [name], MD   Principle Diagnosis: 78 year old woman with IgG lambda multiple myeloma diagnosed in December 2019.  She was diagnosed initially MGUS.  Past therapy:    Velcade and dexamethasone started weekly on 07/27/2018.  Therapy changed in February 2020.  Revlimid 25 mg for 21 days out of a 28-day cycle with dexamethasone started in February 2020.  Therapy was held after 2 cycles because of severe dermatological toxicity.  Current therapy: Pomalyst 1 mg daily for 21 days started on May 1 of 2020.  She completed 4 cycles of therapy.  Interim History:  Mrs. Nicole Keith presents today for a follow-up.  Since the last visit, she has continues to have issues with worsening skin rash.  She describes a diffuse erythematous rash on her back and arms without any blisters.  This has not improved after stopping Revlimid but appears to be recurring with Pomalyst.  She does report itching despite using topical steroid creams as well as oral antihistamines.  She denies any constitutional symptoms of weight loss.  She denies any bone pain or pathological fractures.  Performance status and quality of life remains unchanged.   Patient denied headaches, blurry vision, syncope or seizures.  Denies any fevers, chills or sweats.  Denied chest pain, palpitation, orthopnea or leg edema.  Denied cough, wheezing or hemoptysis.  Denied nausea, vomiting or abdominal pain.  Denies any constipation or diarrhea.  Denies any frequency urgency or hesitancy.  Denies any arthralgias or myalgias.  Denies any skin rashes or lesions.  Denies any bleeding or clotting tendency.  Denies any easy bruising.  Denies any hair or nail changes.  Denies any anxiety or depression.  Remaining review of system is negative.                         Medications: Remain without any changes on  review. Current Outpatient Medications  Medication Sig Dispense Refill  . acetaminophen (TYLENOL) 500 MG tablet Take 500 mg by mouth every 6 (six) hours as needed for moderate pain.    [name] Kitchen acyclovir (ZOVIRAX) 400 MG tablet Take 1 tablet (400 mg total) by mouth 2 (two) times daily. 90 tablet 3  . dexamethasone (DECADRON) 4 MG tablet 5 tablets weekly with chemotherapy (Patient not taking: Reported on 11/10/2018) 100 tablet 3  . fentaNYL (DURAGESIC) 25 MCG/HR Place 1 patch onto the skin every 3 (three) days. (Patient not taking: Reported on 11/16/2018) 10 patch 0  . methylPREDNISolone (MEDROL DOSEPAK) 4 MG TBPK tablet Take 6 tablets for a day. Take 5 tablets for a day Take 4 tablets for a day Take 3 tablets for a day Take 3 tablets for a day Take 2 tablets for a day Take one tablet for day then stop. 21 tablet 0  . Olopatadine HCl 0.2 % SOLN INSTILL 1 DROP INTO EACH EYE ONCE DAILY    . oxyCODONE (OXY IR/ROXICODONE) 5 MG immediate release tablet Take 1 tablet (5 mg total) by mouth every 4 (four) hours as needed for severe pain. (Patient not taking: Reported on 11/10/2018) 90 tablet 0  . pomalidomide (POMALYST) 1 MG capsule Take 1 capsule (1 mg total) by mouth daily. Take with water on days 1-21. Repeat every 28 days. 21 capsule 0  . sodium chloride (OCEAN) 0.65 % SOLN nasal spray Place 1 spray into both nostrils as needed for congestion.  No current facility-administered medications for this visit.      Allergies:  Allergies  Allergen Reactions  . Revlimid [Lenalidomide] Rash  . Betadine [Povidone Iodine] Rash  . Flagyl [Metronidazole] Diarrhea  . Fosamax [Alendronate Sodium] Other (See Comments)    GI distress  . Bactrim [Sulfamethoxazole-Trimethoprim]     nausea  . Doxepin Hcl     Irritable, hallucinations  . Hydroxyzine Hcl     hallucinations  . Valium [Diazepam] Other (See Comments)  . Actonel [Risedronate Sodium] Other (See Comments)    GI distress  . Boniva [Ibandronic Acid] Other (See  Comments)    GI distress  . Doxycycline Rash  . Penicillins Rash    Ancef was OK.    Past Medical History, Surgical history, Social history, and Family History remains unchanged by my review.   Physical Exam:   Blood pressure 139/72, pulse 80, temperature 98.2 F (36.8 C), temperature source Oral, resp. rate 18, height _0  (1.676 m), weight 132 lb 6.4 oz (60.1 kg), SpO2 100 %.      ECOG: 1   General appearance: Alert, awake without any distress. Head: Atraumatic without abnormalities Oropharynx: Without any thrush or ulcers. Eyes: No scleral icterus. Lymph nodes: No lymphadenopathy noted in the cervical, supraclavicular, or axillary nodes Heart:regular rate and rhythm, without any murmurs or gallops.   Lung: Clear to auscultation without any rhonchi, wheezes or dullness to percussion. Abdomin: Soft, nontender without any shifting dullness or ascites. Musculoskeletal: No clubbing or cyanosis. Neurological: No motor or sensory deficits. Skin: Diffuse erythematous rash noted on her chest back and arms.  Dry skin noted without any blistering or desquamation.       Lab Results: Lab Results  Component Value Date   WBC 8.0 03/23/2019   HGB 12.2 03/23/2019   HCT 37.5 03/23/2019   MCV 96.6 03/23/2019   PLT 251 03/23/2019     Chemistry      Component Value Date/Time   NA 136 03/23/2019 1055   NA 135 (L) 06/11/2017 0753   K 4.2 03/23/2019 1055   K 4.0 06/11/2017 0753   CL 102 03/23/2019 1055   CO2 27 03/23/2019 1055   CO2 26 06/11/2017 0753   BUN 13 03/23/2019 1055   BUN 12.8 06/11/2017 0753   CREATININE 0.84 03/23/2019 1055   CREATININE 0.8 06/11/2017 0753      Component Value Date/Time   CALCIUM 8.9 03/23/2019 1055   CALCIUM 8.8 06/11/2017 0753   ALKPHOS 50 03/23/2019 1055   ALKPHOS 72 06/11/2017 0753   AST 23 03/23/2019 1055   AST 21 06/11/2017 0753   ALT 20 03/23/2019 1055   ALT 13 06/11/2017 0753   BILITOT 0.4 03/23/2019 1055   BILITOT 0.35  06/11/2017 0753     Results for FAYETTA, SORENSON (MRN [id number]) as of 03/23/2019 11:55  Ref. Range 12/01/2018 11:23 01/05/2019 12:28 01/26/2019 10:57 02/24/2019 11:07  M Protein SerPl Elph-Mcnc Latest Ref Range: Not Observed g/dL 2.1 (H) 2.3 (H) 2.2 (H) 2.3 (H)        Impression and Plan:  78 year old woman with:  1.  IgG lambda multiple myeloma diagnosed in 2019.  She had presented initially with MGUS.    She is status post 4 cycles of Pomalyst with overall stable disease and M spike indicating about partial response.  She is experiencing dermatological toxicity including diffuse rash without any blistering.  These findings are most likely consistent with drug rash and given the modest response associated with Pomalyst compared  to Revlimid I recommended discontinuation of this therapy.  The natural course of this disease and different salvage therapy options were discussed.  I anticipate using different salvage therapy in any future after her dermatological toxicity resolved.  Daratumumab based therapy may be an option given her intolerance to Revlimid on Pomalyst and lack of response to Velcade.  Keiper list could also be an option as well.  2.  Anemia: Resolved at this time indicating positive response to multiple myeloma.  3. Osteoporosis and compression fracture: He is status post Zometa infusion and has declined further therapy for the time being.  4.  Herpes zoster reactivation: He is off acyclovir with any recent activation.  5.  Rash: Likely related to drug rash although other etiologies to be considered.  She has follow-up with dermatology and I have recommended a potential biopsy if needed.  In the meantime I will prescribe Medrol Dosepak to help with alleviating her symptoms.  6. Follow-up: Will be in 1 month for repeat evaluation.  25  minutes was spent with the patient face-to-face today.  More than 50% of time was spent on reviewing her disease status, treatment options  and managing complications related to therapy. Zola Button, MD 8/12/202011:52 AM

## 2019-03-24 ENCOUNTER — Telehealth: Payer: Self-pay | Admitting: Oncology

## 2019-03-24 LAB — KAPPA/LAMBDA LIGHT CHAINS
Kappa free light chain: 14.9 mg/L (ref 3.3–19.4)
Kappa, lambda light chain ratio: 0.23 — ABNORMAL LOW (ref 0.26–1.65)
Lambda free light chains: 65.8 mg/L — ABNORMAL HIGH (ref 5.7–26.3)

## 2019-03-24 NOTE — Telephone Encounter (Signed)
Called and left msg. Mailed printout  °

## 2019-03-25 ENCOUNTER — Telehealth: Payer: Self-pay

## 2019-03-25 LAB — MULTIPLE MYELOMA PANEL, SERUM
Albumin SerPl Elph-Mcnc: 3.3 g/dL (ref 2.9–4.4)
Albumin/Glob SerPl: 0.8 (ref 0.7–1.7)
Alpha 1: 0.2 g/dL (ref 0.0–0.4)
Alpha2 Glob SerPl Elph-Mcnc: 0.6 g/dL (ref 0.4–1.0)
B-Globulin SerPl Elph-Mcnc: 0.8 g/dL (ref 0.7–1.3)
Gamma Glob SerPl Elph-Mcnc: 2.5 g/dL — ABNORMAL HIGH (ref 0.4–1.8)
Globulin, Total: 4.2 g/dL — ABNORMAL HIGH (ref 2.2–3.9)
IgA: 60 mg/dL — ABNORMAL LOW (ref 64–422)
IgG (Immunoglobin G), Serum: 2800 mg/dL — ABNORMAL HIGH (ref 586–1602)
IgM (Immunoglobulin M), Srm: 51 mg/dL (ref 26–217)
M Protein SerPl Elph-Mcnc: 2.4 g/dL — ABNORMAL HIGH
Total Protein ELP: 7.5 g/dL (ref 6.0–8.5)

## 2019-03-25 NOTE — Telephone Encounter (Signed)
Contacted patient and left message with results and to call back if a copy of labs is desired or if there are any other questions or concerns.

## 2019-03-25 NOTE — Telephone Encounter (Signed)
-----   Message from Wyatt Portela, MD sent at 03/25/2019  1:52 PM EDT ----- Please let her know that her M protein is stable. Ok to send her a copy if she wants.

## 2019-04-22 ENCOUNTER — Inpatient Hospital Stay (HOSPITAL_BASED_OUTPATIENT_CLINIC_OR_DEPARTMENT_OTHER): Payer: Medicare Other | Admitting: Oncology

## 2019-04-22 ENCOUNTER — Other Ambulatory Visit: Payer: Self-pay

## 2019-04-22 ENCOUNTER — Inpatient Hospital Stay: Payer: Medicare Other | Attending: Oncology

## 2019-04-22 VITALS — BP 140/71 | HR 84 | Temp 98.9°F | Resp 18 | Ht 66.0 in | Wt 133.2 lb

## 2019-04-22 DIAGNOSIS — C9000 Multiple myeloma not having achieved remission: Secondary | ICD-10-CM | POA: Insufficient documentation

## 2019-04-22 DIAGNOSIS — B029 Zoster without complications: Secondary | ICD-10-CM | POA: Insufficient documentation

## 2019-04-22 DIAGNOSIS — D649 Anemia, unspecified: Secondary | ICD-10-CM | POA: Insufficient documentation

## 2019-04-22 DIAGNOSIS — D472 Monoclonal gammopathy: Secondary | ICD-10-CM

## 2019-04-22 LAB — CBC WITH DIFFERENTIAL (CANCER CENTER ONLY)
Abs Immature Granulocytes: 0.03 10*3/uL (ref 0.00–0.07)
Basophils Absolute: 0.1 10*3/uL (ref 0.0–0.1)
Basophils Relative: 1 %
Eosinophils Absolute: 0.2 10*3/uL (ref 0.0–0.5)
Eosinophils Relative: 3 %
HCT: 37.9 % (ref 36.0–46.0)
Hemoglobin: 12.4 g/dL (ref 12.0–15.0)
Immature Granulocytes: 0 %
Lymphocytes Relative: 29 %
Lymphs Abs: 2 10*3/uL (ref 0.7–4.0)
MCH: 31.4 pg (ref 26.0–34.0)
MCHC: 32.7 g/dL (ref 30.0–36.0)
MCV: 95.9 fL (ref 80.0–100.0)
Monocytes Absolute: 0.5 10*3/uL (ref 0.1–1.0)
Monocytes Relative: 8 %
Neutro Abs: 3.9 10*3/uL (ref 1.7–7.7)
Neutrophils Relative %: 59 %
Platelet Count: 318 10*3/uL (ref 150–400)
RBC: 3.95 MIL/uL (ref 3.87–5.11)
RDW: 14.4 % (ref 11.5–15.5)
WBC Count: 6.7 10*3/uL (ref 4.0–10.5)
nRBC: 0 % (ref 0.0–0.2)

## 2019-04-22 LAB — CMP (CANCER CENTER ONLY)
ALT: 15 U/L (ref 0–44)
AST: 20 U/L (ref 15–41)
Albumin: 3.4 g/dL — ABNORMAL LOW (ref 3.5–5.0)
Alkaline Phosphatase: 56 U/L (ref 38–126)
Anion gap: 6 (ref 5–15)
BUN: 11 mg/dL (ref 8–23)
CO2: 28 mmol/L (ref 22–32)
Calcium: 8.8 mg/dL — ABNORMAL LOW (ref 8.9–10.3)
Chloride: 103 mmol/L (ref 98–111)
Creatinine: 0.88 mg/dL (ref 0.44–1.00)
GFR, Est AFR Am: >60 mL/min (ref >60)
GFR, Estimated: >60 mL/min (ref >60)
Glucose, Bld: 131 mg/dL — ABNORMAL HIGH (ref 70–99)
Potassium: 4.3 mmol/L (ref 3.5–5.1)
Sodium: 137 mmol/L (ref 135–145)
Total Bilirubin: 0.3 mg/dL (ref 0.3–1.2)
Total Protein: 8.5 g/dL — ABNORMAL HIGH (ref 6.5–8.1)

## 2019-04-22 NOTE — Progress Notes (Signed)
Hematology and Oncology Follow Up Visit  Nicole Keith [id number] 26-Nov-1940 78 y.o. 04/22/2019 10:38 AM Nicole Keith, [name], [name], MD   Principle Diagnosis: 78 year old woman with multiple myeloma arising from MGUS diagnosed in 2019.  She has IgG lambda subtype.  Bone marrow biopsy showed 30% plasma cell infiltration.  Past therapy:    Velcade and dexamethasone started weekly on 07/27/2018.  Therapy changed in February 2020.  Revlimid 25 mg for 21 days out of a 28-day cycle with dexamethasone started in February 2020.  Therapy was held after 2 cycles because of severe dermatological toxicity.  Pomalyst 1 mg daily for 21 days started on May 1 of 2020.  She completed 4 cycles of therapy.  Current therapy: Active surveillance for the time being.  Interim History:  Nicole Keith returns today for a follow-up.  Since the last visit, she reports no changes in her health.  She continues to feel reasonably well and ambulates with the help of a cane for the most part.  She denies any worsening back pain bone pain.  She denies any pathological fractures.  She denies any worsening fatigue.  She continues to have skin rash that has continuously improved although not completely resolved.  She denied any alteration mental status, neuropathy, confusion or dizziness.  Denies any headaches or lethargy.  Denies any night sweats, weight loss or changes in appetite.  Denied orthopnea, dyspnea on exertion or chest discomfort.  Denies shortness of breath, difficulty breathing hemoptysis or cough.  Denies any abdominal distention, nausea, early satiety or dyspepsia.  Denies any hematuria, frequency, dysuria or nocturia.  Denies any skin irritation, dryness or rash.  Denies any ecchymosis or petechiae.  Denies any lymphadenopathy or clotting.  Denies any heat or cold intolerance.  Denies any anxiety or depression.  Remaining review of system is negative.                              Medications: Without any changes on review. Current Outpatient Medications  Medication Sig Dispense Refill  . acetaminophen (TYLENOL) 500 MG tablet Take 500 mg by mouth every 6 (six) hours as needed for moderate pain.    [name] Kitchen acyclovir (ZOVIRAX) 400 MG tablet Take 1 tablet (400 mg total) by mouth 2 (two) times daily. 90 tablet 3  . dexamethasone (DECADRON) 4 MG tablet 5 tablets weekly with chemotherapy (Patient not taking: Reported on 11/10/2018) 100 tablet 3  . fentaNYL (DURAGESIC) 25 MCG/HR Place 1 patch onto the skin every 3 (three) days. (Patient not taking: Reported on 11/16/2018) 10 patch 0  . methylPREDNISolone (MEDROL DOSEPAK) 4 MG TBPK tablet Take 6 tablets for a day. Take 5 tablets for a day Take 4 tablets for a day Take 3 tablets for a day Take 3 tablets for a day Take 2 tablets for a day Take one tablet for day then stop. 21 tablet 0  . Olopatadine HCl 0.2 % SOLN INSTILL 1 DROP INTO EACH EYE ONCE DAILY    . oxyCODONE (OXY IR/ROXICODONE) 5 MG immediate release tablet Take 1 tablet (5 mg total) by mouth every 4 (four) hours as needed for severe pain. (Patient not taking: Reported on 11/10/2018) 90 tablet 0  . pomalidomide (POMALYST) 1 MG capsule Take 1 capsule (1 mg total) by mouth daily. Take with water on days 1-21. Repeat every 28 days. 21 capsule 0  . sodium chloride (OCEAN) 0.65 % SOLN nasal spray Place 1 spray into both  nostrils as needed for congestion.     No current facility-administered medications for this visit.      Allergies:  Allergies  Allergen Reactions  . Revlimid [Lenalidomide] Rash  . Betadine [Povidone Iodine] Rash  . Flagyl [Metronidazole] Diarrhea  . Fosamax [Alendronate Sodium] Other (See Comments)    GI distress  . Bactrim [Sulfamethoxazole-Trimethoprim]     nausea  . Doxepin Hcl     Irritable, hallucinations  . Hydroxyzine Hcl     hallucinations  . Valium [Diazepam] Other (See Comments)  . Actonel [Risedronate Sodium] Other (See Comments)    GI distress   . Boniva [Ibandronic Acid] Other (See Comments)    GI distress  . Doxycycline Rash  . Penicillins Rash    Ancef was OK.    Past Medical History, Surgical history, Social history, and Family History unchanged on review.   Physical Exam:   Blood pressure 140/71, pulse 84, temperature 98.9 F (37.2 C), temperature source Oral, resp. rate 18, height '5\' 6"'  (1.676 m), weight 133 lb 3.2 oz (60.4 kg), SpO2 98 %.      ECOG: 1    General appearance: Comfortable appearing without any discomfort Head: Normocephalic without any trauma Oropharynx: Mucous membranes are moist and pink without any thrush or ulcers. Eyes: Pupils are equal and round reactive to light. Lymph nodes: No cervical, supraclavicular, inguinal or axillary lymphadenopathy.   Heart:regular rate and rhythm.  S1 and S2 without leg edema. Lung: Clear without any rhonchi or wheezes.  No dullness to percussion. Abdomin: Soft, nontender, nondistended with good bowel sounds.  No hepatosplenomegaly. Musculoskeletal: No joint deformity or effusion.  Full range of motion noted. Neurological: No deficits noted on motor, sensory and deep tendon reflex exam. Skin: Mild patchy raised erythematous lesions noted on her arms and back.        Lab Results: Lab Results  Component Value Date   WBC 8.0 03/23/2019   HGB 12.2 03/23/2019   HCT 37.5 03/23/2019   MCV 96.6 03/23/2019   PLT 251 03/23/2019     Chemistry      Component Value Date/Time   NA 136 03/23/2019 1055   NA 135 (L) 06/11/2017 0753   K 4.2 03/23/2019 1055   K 4.0 06/11/2017 0753   CL 102 03/23/2019 1055   CO2 27 03/23/2019 1055   CO2 26 06/11/2017 0753   BUN 13 03/23/2019 1055   BUN 12.8 06/11/2017 0753   CREATININE 0.84 03/23/2019 1055   CREATININE 0.8 06/11/2017 0753      Component Value Date/Time   CALCIUM 8.9 03/23/2019 1055   CALCIUM 8.8 06/11/2017 0753   ALKPHOS 50 03/23/2019 1055   ALKPHOS 72 06/11/2017 0753   AST 23 03/23/2019 1055   AST  21 06/11/2017 0753   ALT 20 03/23/2019 1055   ALT 13 06/11/2017 0753   BILITOT 0.4 03/23/2019 1055   BILITOT 0.35 06/11/2017 0753      Results for Nicole Keith (MRN [id number]) as of 04/22/2019 10:14  Ref. Range 01/26/2019 10:57 02/24/2019 11:07 03/23/2019 10:55  M Protein SerPl Elph-Mcnc Latest Ref Range: Not Observed g/dL 2.2 (H) 2.3 (H) 2.4 (H)        Impression and Plan:  78 year old woman with:  1.  Multiple myeloma diagnosed in 2019 after presenting with IgG lambda MGUS in 2016.  She developed worsening anemia at that time.  She is status post therapy outlined above with poor tolerance both to Revlimid and Pomalyst with dermatological toxicity despite dose reduction  and interruption.  She has also no benefit to Velcade based therapy.  Protein studies obtained on March 23, 2019 showed an M spike of about 2.4 g/dL and IgG level at 2800.  Her hemoglobin is normal without any evidence of endorgan damage.  Treatment options were reviewed at this time including using different salvage therapy with daratumumab based therapy versus continued observation and surveillance at this time given the lack of symptoms and multiple complications related to therapy she has experienced.  After discussion today, we have opted to continue to hold treatment unless he has symptomatic progression.  2.  Anemia: Initially related to plasma cell disorder with normalization of her hemoglobin.  3. Osteoporosis and compression fracture: He is status post Zometa infusion and has declined further therapy for the time being.  4.  Herpes zoster reactivation: No zoster reactivation noted at this time.  She is off acyclovir.  5.  Rash: Improved at this time but not completely resolved.  6. Follow-up: She will continue to follow on a monthly basis for strict surveillance for the time being.  25  minutes was spent with the patient face-to-face today.  More than 50% of time was dedicated to updating her  disease status, treatment options and complications related to therapy. Zola Button, MD 9/11/202010:38 AM

## 2019-04-24 LAB — KAPPA/LAMBDA LIGHT CHAINS
Kappa free light chain: 10.3 mg/L (ref 3.3–19.4)
Kappa, lambda light chain ratio: 0.09 — ABNORMAL LOW (ref 0.26–1.65)
Lambda free light chains: 109.8 mg/L — ABNORMAL HIGH (ref 5.7–26.3)

## 2019-04-25 ENCOUNTER — Telehealth: Payer: Self-pay | Admitting: Oncology

## 2019-04-25 LAB — MULTIPLE MYELOMA PANEL, SERUM
Albumin SerPl Elph-Mcnc: 3.4 g/dL (ref 2.9–4.4)
Albumin/Glob SerPl: 0.8 (ref 0.7–1.7)
Alpha 1: 0.2 g/dL (ref 0.0–0.4)
Alpha2 Glob SerPl Elph-Mcnc: 0.8 g/dL (ref 0.4–1.0)
B-Globulin SerPl Elph-Mcnc: 0.7 g/dL (ref 0.7–1.3)
Gamma Glob SerPl Elph-Mcnc: 2.7 g/dL — ABNORMAL HIGH (ref 0.4–1.8)
Globulin, Total: 4.4 g/dL — ABNORMAL HIGH (ref 2.2–3.9)
IgA: 58 mg/dL — ABNORMAL LOW (ref 64–422)
IgG (Immunoglobin G), Serum: 3120 mg/dL — ABNORMAL HIGH (ref 586–1602)
IgM (Immunoglobulin M), Srm: 47 mg/dL (ref 26–217)
M Protein SerPl Elph-Mcnc: 2.4 g/dL — ABNORMAL HIGH
Total Protein ELP: 7.8 g/dL (ref 6.0–8.5)

## 2019-04-25 NOTE — Telephone Encounter (Signed)
Called and spoke with patient.confirmed appt  °

## 2019-05-19 ENCOUNTER — Other Ambulatory Visit: Payer: Self-pay

## 2019-05-19 ENCOUNTER — Inpatient Hospital Stay: Payer: Medicare Other

## 2019-05-19 ENCOUNTER — Inpatient Hospital Stay: Payer: Medicare Other | Attending: Oncology | Admitting: Oncology

## 2019-05-19 VITALS — BP 150/92 | HR 79 | Temp 98.7°F | Resp 18

## 2019-05-19 DIAGNOSIS — C9000 Multiple myeloma not having achieved remission: Secondary | ICD-10-CM | POA: Insufficient documentation

## 2019-05-19 DIAGNOSIS — B029 Zoster without complications: Secondary | ICD-10-CM | POA: Insufficient documentation

## 2019-05-19 DIAGNOSIS — D472 Monoclonal gammopathy: Secondary | ICD-10-CM

## 2019-05-19 LAB — CBC WITH DIFFERENTIAL (CANCER CENTER ONLY)
Abs Immature Granulocytes: 0.03 10*3/uL (ref 0.00–0.07)
Basophils Absolute: 0.1 10*3/uL (ref 0.0–0.1)
Basophils Relative: 1 %
Eosinophils Absolute: 0.1 10*3/uL (ref 0.0–0.5)
Eosinophils Relative: 1 %
HCT: 36.9 % (ref 36.0–46.0)
Hemoglobin: 12.1 g/dL (ref 12.0–15.0)
Immature Granulocytes: 0 %
Lymphocytes Relative: 29 %
Lymphs Abs: 2.4 10*3/uL (ref 0.7–4.0)
MCH: 31.6 pg (ref 26.0–34.0)
MCHC: 32.8 g/dL (ref 30.0–36.0)
MCV: 96.3 fL (ref 80.0–100.0)
Monocytes Absolute: 0.6 10*3/uL (ref 0.1–1.0)
Monocytes Relative: 8 %
Neutro Abs: 5 10*3/uL (ref 1.7–7.7)
Neutrophils Relative %: 61 %
Platelet Count: 340 10*3/uL (ref 150–400)
RBC: 3.83 MIL/uL — ABNORMAL LOW (ref 3.87–5.11)
RDW: 14.5 % (ref 11.5–15.5)
WBC Count: 8.2 10*3/uL (ref 4.0–10.5)
nRBC: 0 % (ref 0.0–0.2)

## 2019-05-19 LAB — CMP (CANCER CENTER ONLY)
ALT: 13 U/L (ref 0–44)
AST: 19 U/L (ref 15–41)
Albumin: 3.2 g/dL — ABNORMAL LOW (ref 3.5–5.0)
Alkaline Phosphatase: 51 U/L (ref 38–126)
Anion gap: 6 (ref 5–15)
BUN: 12 mg/dL (ref 8–23)
CO2: 26 mmol/L (ref 22–32)
Calcium: 8.5 mg/dL — ABNORMAL LOW (ref 8.9–10.3)
Chloride: 104 mmol/L (ref 98–111)
Creatinine: 0.81 mg/dL (ref 0.44–1.00)
GFR, Est AFR Am: >60 mL/min (ref >60)
GFR, Estimated: >60 mL/min (ref >60)
Glucose, Bld: 98 mg/dL (ref 70–99)
Potassium: 4.2 mmol/L (ref 3.5–5.1)
Sodium: 136 mmol/L (ref 135–145)
Total Bilirubin: 0.3 mg/dL (ref 0.3–1.2)
Total Protein: 8.8 g/dL — ABNORMAL HIGH (ref 6.5–8.1)

## 2019-05-19 NOTE — Progress Notes (Signed)
Hematology and Oncology Follow Up Visit  Nicole Keith [id number] 1940/10/08 78 y.o. 05/19/2019 12:42 PM Nicole Keith, [name], Nicole Lies, MD   Principle Diagnosis: 78 year old woman with IgG lambda multiple myeloma diagnosed in 2019.  She presented with MGUS and subsequently found to have 30% plasma cell infiltration in the bone marrow.  Past therapy:    Velcade and dexamethasone started weekly on 07/27/2018.  Therapy changed in February 2020.  Revlimid 25 mg for 21 days out of a 28-day cycle with dexamethasone started in February 2020.  Therapy was held after 2 cycles because of severe dermatological toxicity.  Pomalyst 1 mg daily for 21 days started on May 1 of 2020.  She completed 4 cycles of therapy.  Current therapy: Active surveillance for the time being.  Interim History:  Nicole Keith is here for repeat evaluation.  Since her last visit, she reports of feeling reasonably well without any residual complications related to Revlimid or Pomalyst.  She still has some pruritus and faint rash but for the most part feels well.  She denies any worsening bone pain or pathological fractures.  She denies any recent hospitalization or illnesses.  He is ambulating without any increased pain or discomfort.  She does require wheelchair for an extended ambulation such as clinic visits.  Patient denied headaches, blurry vision, syncope or seizures.  Denies any fevers, chills or sweats.  Denied chest pain, palpitation, orthopnea or leg edema.  Denied cough, wheezing or hemoptysis.  Denied nausea, vomiting or abdominal pain.  Denies any constipation or diarrhea.  Denies any frequency urgency or hesitancy.  Denies any arthralgias or myalgias.  Denies any skin rashes or lesions.  Denies any bleeding or clotting tendency.  Denies any easy bruising.  Denies any hair or nail changes.  Denies any anxiety or depression.  Remaining review of system is  negative.                               Medications: Updated today on review. Current Outpatient Medications  Medication Sig Dispense Refill  . acetaminophen (TYLENOL) 500 MG tablet Take 500 mg by mouth every 6 (six) hours as needed for moderate pain.    [name] Kitchen acyclovir (ZOVIRAX) 400 MG tablet Take 1 tablet (400 mg total) by mouth 2 (two) times daily. 90 tablet 3  . dexamethasone (DECADRON) 4 MG tablet 5 tablets weekly with chemotherapy (Patient not taking: Reported on 11/10/2018) 100 tablet 3  . fentaNYL (DURAGESIC) 25 MCG/HR Place 1 patch onto the skin every 3 (three) days. (Patient not taking: Reported on 11/16/2018) 10 patch 0  . methylPREDNISolone (MEDROL DOSEPAK) 4 MG TBPK tablet Take 6 tablets for a day. Take 5 tablets for a day Take 4 tablets for a day Take 3 tablets for a day Take 3 tablets for a day Take 2 tablets for a day Take one tablet for day then stop. 21 tablet 0  . Olopatadine HCl 0.2 % SOLN INSTILL 1 DROP INTO EACH EYE ONCE DAILY    . oxyCODONE (OXY IR/ROXICODONE) 5 MG immediate release tablet Take 1 tablet (5 mg total) by mouth every 4 (four) hours as needed for severe pain. (Patient not taking: Reported on 11/10/2018) 90 tablet 0  . pomalidomide (POMALYST) 1 MG capsule Take 1 capsule (1 mg total) by mouth daily. Take with water on days 1-21. Repeat every 28 days. 21 capsule 0  . sodium chloride (OCEAN) 0.65 % SOLN nasal spray Place  1 spray into both nostrils as needed for congestion.     No current facility-administered medications for this visit.      Allergies:  Allergies  Allergen Reactions  . Revlimid [Lenalidomide] Rash  . Betadine [Povidone Iodine] Rash  . Flagyl [Metronidazole] Diarrhea  . Fosamax [Alendronate Sodium] Other (See Comments)    GI distress  . Bactrim [Sulfamethoxazole-Trimethoprim]     nausea  . Doxepin Hcl     Irritable, hallucinations  . Hydroxyzine Hcl     hallucinations  . Valium [Diazepam] Other (See Comments)  .  Actonel [Risedronate Sodium] Other (See Comments)    GI distress  . Boniva [Ibandronic Acid] Other (See Comments)    GI distress  . Doxycycline Rash  . Penicillins Rash    Ancef was OK.    Past Medical History, Surgical history, Social history, and Family History without any changes on review.  Physical Exam:   Blood pressure (!) 150/92, pulse 79, temperature 98.7 F (37.1 C), temperature source Tympanic, resp. rate 18, SpO2 99 %.      ECOG: 1    General appearance: Alert, awake without any distress. Head: Atraumatic without abnormalities Oropharynx: Without any thrush or ulcers. Eyes: No scleral icterus. Lymph nodes: No lymphadenopathy noted in the cervical, supraclavicular, or axillary nodes Heart:regular rate and rhythm, without any murmurs or gallops.   Lung: Clear to auscultation without any rhonchi, wheezes or dullness to percussion. Abdomin: Soft, nontender without any shifting dullness or ascites. Musculoskeletal: No clubbing or cyanosis. Neurological: No motor or sensory deficits. Skin: No rashes or lesions. Psychiatric: Mood and affect appeared normal.         Lab Results: Lab Results  Component Value Date   WBC 8.2 05/19/2019   HGB 12.1 05/19/2019   HCT 36.9 05/19/2019   MCV 96.3 05/19/2019   PLT 340 05/19/2019     Chemistry      Component Value Date/Time   NA 137 04/22/2019 1017   NA 135 (L) 06/11/2017 0753   K 4.3 04/22/2019 1017   K 4.0 06/11/2017 0753   CL 103 04/22/2019 1017   CO2 28 04/22/2019 1017   CO2 26 06/11/2017 0753   BUN 11 04/22/2019 1017   BUN 12.8 06/11/2017 0753   CREATININE 0.88 04/22/2019 1017   CREATININE 0.8 06/11/2017 0753      Component Value Date/Time   CALCIUM 8.8 (L) 04/22/2019 1017   CALCIUM 8.8 06/11/2017 0753   ALKPHOS 56 04/22/2019 1017   ALKPHOS 72 06/11/2017 0753   AST 20 04/22/2019 1017   AST 21 06/11/2017 0753   ALT 15 04/22/2019 1017   ALT 13 06/11/2017 0753   BILITOT 0.3 04/22/2019 1017    BILITOT 0.35 06/11/2017 0753        Results for Nicole Keith (MRN [id number]) as of 05/19/2019 12:44  Ref. Range 03/23/2019 10:55 04/22/2019 10:17  M Protein SerPl Elph-Mcnc Latest Ref Range: Not Observed g/dL 2.4 (H) 2.4 (H)       Impression and Plan:  78 year old woman with:  1.  IgG lambda multiple myeloma diagnosed in 2019 after MGUS was noted in 2016.  She is currently on active surveillance after poor tolerance to Revlimid and Pomalyst after developing dermatological toxicities.  He did have an excellent response to Revlimid with M spike remains stable currently at 2.4 g/dL indicating 50% response.  Risks and benefits of restarting Revlimid at a lower dose versus different salvage therapy were reviewed.  At this time she is asymptomatic from  her disease and we will defer starting salvage therapy for the time being.  Daratumumab based therapy could be an option as well as other salvage agents.  2.  Anemia: Resolved at this time a hemoglobin appears to be normal.  Her anemia is related to plasma cell disorder.  3. Osteoporosis and compression fracture: She declined a further Zometa at this time.  4.  Herpes zoster reactivation: No recent reactivation noted.  She is off acyclovir.  5.  Rash: Continues to improve but not resolved at this time.  Suspected related to Revlimid.  6. Follow-up: In 1 month for repeat evaluation.  25  minutes was spent with the patient face-to-face today.  More than 50% of time was spent on reviewing her disease status, treatment options and answering questions regarding future plan of care.  Zola Button, MD 10/8/202012:42 PM

## 2019-05-19 NOTE — Addendum Note (Signed)
Addended by: Scot Dock on: 05/19/2019 02:17 PM   Modules accepted: Orders

## 2019-05-20 LAB — KAPPA/LAMBDA LIGHT CHAINS
Kappa free light chain: 9.6 mg/L (ref 3.3–19.4)
Kappa, lambda light chain ratio: 0.08 — ABNORMAL LOW (ref 0.26–1.65)
Lambda free light chains: 117.9 mg/L — ABNORMAL HIGH (ref 5.7–26.3)

## 2019-05-23 ENCOUNTER — Telehealth: Payer: Self-pay | Admitting: Oncology

## 2019-05-23 LAB — MULTIPLE MYELOMA PANEL, SERUM
Albumin SerPl Elph-Mcnc: 3.6 g/dL (ref 2.9–4.4)
Albumin/Glob SerPl: 0.8 (ref 0.7–1.7)
Alpha 1: 0.2 g/dL (ref 0.0–0.4)
Alpha2 Glob SerPl Elph-Mcnc: 0.6 g/dL (ref 0.4–1.0)
B-Globulin SerPl Elph-Mcnc: 0.8 g/dL (ref 0.7–1.3)
Gamma Glob SerPl Elph-Mcnc: 3.2 g/dL — ABNORMAL HIGH (ref 0.4–1.8)
Globulin, Total: 4.8 g/dL — ABNORMAL HIGH (ref 2.2–3.9)
IgA: 59 mg/dL — ABNORMAL LOW (ref 64–422)
IgG (Immunoglobin G), Serum: 3627 mg/dL — ABNORMAL HIGH (ref 586–1602)
IgM (Immunoglobulin M), Srm: 50 mg/dL (ref 26–217)
M Protein SerPl Elph-Mcnc: 2.9 g/dL — ABNORMAL HIGH
Total Protein ELP: 8.4 g/dL (ref 6.0–8.5)

## 2019-05-23 NOTE — Telephone Encounter (Signed)
Scheduled per sch msg. Called and spoke with patient. Confirmed appt  

## 2019-06-23 ENCOUNTER — Inpatient Hospital Stay: Payer: Medicare Other

## 2019-06-23 ENCOUNTER — Other Ambulatory Visit: Payer: Self-pay

## 2019-06-23 ENCOUNTER — Inpatient Hospital Stay: Payer: Medicare Other | Attending: Oncology | Admitting: Oncology

## 2019-06-23 VITALS — BP 135/77 | HR 71 | Temp 98.5°F | Resp 16 | Ht 66.0 in | Wt 136.8 lb

## 2019-06-23 DIAGNOSIS — B029 Zoster without complications: Secondary | ICD-10-CM | POA: Diagnosis not present

## 2019-06-23 DIAGNOSIS — R21 Rash and other nonspecific skin eruption: Secondary | ICD-10-CM | POA: Insufficient documentation

## 2019-06-23 DIAGNOSIS — D649 Anemia, unspecified: Secondary | ICD-10-CM | POA: Diagnosis not present

## 2019-06-23 DIAGNOSIS — M4850XA Collapsed vertebra, not elsewhere classified, site unspecified, initial encounter for fracture: Secondary | ICD-10-CM | POA: Diagnosis not present

## 2019-06-23 DIAGNOSIS — C9000 Multiple myeloma not having achieved remission: Secondary | ICD-10-CM | POA: Insufficient documentation

## 2019-06-23 DIAGNOSIS — M810 Age-related osteoporosis without current pathological fracture: Secondary | ICD-10-CM | POA: Diagnosis not present

## 2019-06-23 DIAGNOSIS — D472 Monoclonal gammopathy: Secondary | ICD-10-CM

## 2019-06-23 LAB — CBC WITH DIFFERENTIAL (CANCER CENTER ONLY)
Abs Immature Granulocytes: 0.05 10*3/uL (ref 0.00–0.07)
Basophils Absolute: 0.1 10*3/uL (ref 0.0–0.1)
Basophils Relative: 1 %
Eosinophils Absolute: 0.1 10*3/uL (ref 0.0–0.5)
Eosinophils Relative: 1 %
HCT: 36.7 % (ref 36.0–46.0)
Hemoglobin: 11.9 g/dL — ABNORMAL LOW (ref 12.0–15.0)
Immature Granulocytes: 1 %
Lymphocytes Relative: 29 %
Lymphs Abs: 2.3 10*3/uL (ref 0.7–4.0)
MCH: 31.1 pg (ref 26.0–34.0)
MCHC: 32.4 g/dL (ref 30.0–36.0)
MCV: 95.8 fL (ref 80.0–100.0)
Monocytes Absolute: 0.8 10*3/uL (ref 0.1–1.0)
Monocytes Relative: 9 %
Neutro Abs: 4.8 10*3/uL (ref 1.7–7.7)
Neutrophils Relative %: 59 %
Platelet Count: 327 10*3/uL (ref 150–400)
RBC: 3.83 MIL/uL — ABNORMAL LOW (ref 3.87–5.11)
RDW: 14 % (ref 11.5–15.5)
WBC Count: 8.1 10*3/uL (ref 4.0–10.5)
nRBC: 0 % (ref 0.0–0.2)

## 2019-06-23 LAB — CMP (CANCER CENTER ONLY)
ALT: 14 U/L (ref 0–44)
AST: 18 U/L (ref 15–41)
Albumin: 3.3 g/dL — ABNORMAL LOW (ref 3.5–5.0)
Alkaline Phosphatase: 47 U/L (ref 38–126)
Anion gap: 7 (ref 5–15)
BUN: 10 mg/dL (ref 8–23)
CO2: 28 mmol/L (ref 22–32)
Calcium: 8.8 mg/dL — ABNORMAL LOW (ref 8.9–10.3)
Chloride: 101 mmol/L (ref 98–111)
Creatinine: 0.83 mg/dL (ref 0.44–1.00)
GFR, Est AFR Am: >60 mL/min (ref >60)
GFR, Estimated: >60 mL/min (ref >60)
Glucose, Bld: 115 mg/dL — ABNORMAL HIGH (ref 70–99)
Potassium: 3.9 mmol/L (ref 3.5–5.1)
Sodium: 136 mmol/L (ref 135–145)
Total Bilirubin: 0.3 mg/dL (ref 0.3–1.2)
Total Protein: 9.5 g/dL — ABNORMAL HIGH (ref 6.5–8.1)

## 2019-06-23 NOTE — Progress Notes (Signed)
Hematology and Oncology Follow Up Visit  Nicole Keith [id number] [birth year] 78 y.o. 06/23/2019 11:46 AM [name], [name], [name], MD   Principle Diagnosis: 78 year old woman with multiple myeloma diagnosed in 2019.  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  Past therapy:    Velcade and dexamethasone started weekly on 07/27/2018.  Therapy changed in February 2020.  Revlimid 25 mg for 21 days out of a 28-day cycle with dexamethasone started in February 2020.  Therapy was held after 2 cycles because of severe dermatological toxicity.  Pomalyst 1 mg daily for 21 days started on May 1 of 2020.  She completed 4 cycles of therapy.  Current therapy: Under consideration to start salvage therapy..  Interim History:  Nicole Keith returns today for a follow-up.  Since the last visit, she reports no major changes in her health.  She continues to feel well and recover from previous treatments.  She denies any chest pain or difficulty breathing.  She denies any bone pain or pathological fractures.  She continues to have rash on her chest and back which has improved although not resolved.  She does ambulate with the help of a cane but does use a wheelchair for her doctor's visits.  He is able to enjoy reasonable quality of life at this time.   She denied any alteration mental status, neuropathy, confusion or dizziness.  Denies any headaches or lethargy.  Denies any night sweats, weight loss or changes in appetite.  Denied orthopnea, dyspnea on exertion or chest discomfort.  Denies shortness of breath, difficulty breathing hemoptysis or cough.  Denies any abdominal distention, nausea, early satiety or dyspepsia.  Denies any hematuria, frequency, dysuria or nocturia.  Denies any skin irritation, dryness or rash.  Denies any ecchymosis or petechiae.  Denies any lymphadenopathy or clotting.  Denies any heat or cold intolerance.   Denies any anxiety or depression.  Remaining review of system is negative.            Medications: Unchanged on review. Current Outpatient Medications  Medication Sig Dispense Refill  . Olopatadine HCl 0.2 % SOLN INSTILL 1 DROP INTO EACH EYE ONCE DAILY     No current facility-administered medications for this visit.      Allergies:  Allergies  Allergen Reactions  . Revlimid [Lenalidomide] Rash  . Betadine [Povidone Iodine] Rash  . Flagyl [Metronidazole] Diarrhea  . Fosamax [Alendronate Sodium] Other (See Comments)    GI distress  . Bactrim [Sulfamethoxazole-Trimethoprim]     nausea  . Doxepin Hcl     Irritable, hallucinations  . Hydroxyzine Hcl     hallucinations  . Valium [Diazepam] Other (See Comments)  . Actonel [Risedronate Sodium] Other (See Comments)    GI distress  . Boniva [Ibandronic Acid] Other (See Comments)    GI distress  . Doxycycline Rash  . Penicillins Rash    Ancef was OK.    Past Medical History, Surgical history, Social history, and Family History updated without changes.  Physical Exam:    Blood pressure 135/77, pulse 71, temperature 98.5 F (36.9 C), temperature source Temporal, resp. rate 16, height '5\' 6"'  (1.676 m), weight 136 lb 12.8 oz (62.1 kg), SpO2 98 %.     ECOG: 1    General appearance: Comfortable appearing without any discomfort Head: Normocephalic without any trauma Oropharynx: Mucous membranes are moist and pink without any thrush or ulcers. Eyes: Pupils are equal and round reactive to light.  Lymph nodes: No cervical, supraclavicular, inguinal or axillary lymphadenopathy.   Heart:regular rate and rhythm.  S1 and S2 without leg edema. Lung: Clear without any rhonchi or wheezes.  No dullness to percussion. Abdomin: Soft, nontender, nondistended with good bowel sounds.  No hepatosplenomegaly. Musculoskeletal: No joint deformity or effusion.  Full range of motion noted. Neurological: No deficits noted on motor, sensory  and deep tendon reflex exam. Skin: No petechial rash or dryness.  Appeared moist.          Lab Results: Lab Results  Component Value Date   WBC 8.1 06/23/2019   HGB 11.9 (L) 06/23/2019   HCT 36.7 06/23/2019   MCV 95.8 06/23/2019   PLT 327 06/23/2019     Chemistry      Component Value Date/Time   NA 136 05/19/2019 1158   NA 135 (L) 06/11/2017 0753   K 4.2 05/19/2019 1158   K 4.0 06/11/2017 0753   CL 104 05/19/2019 1158   CO2 26 05/19/2019 1158   CO2 26 06/11/2017 0753   BUN 12 05/19/2019 1158   BUN 12.8 06/11/2017 0753   CREATININE 0.81 05/19/2019 1158   CREATININE 0.8 06/11/2017 0753      Component Value Date/Time   CALCIUM 8.5 (L) 05/19/2019 1158   CALCIUM 8.8 06/11/2017 0753   ALKPHOS 51 05/19/2019 1158   ALKPHOS 72 06/11/2017 0753   AST 19 05/19/2019 1158   AST 21 06/11/2017 0753   ALT 13 05/19/2019 1158   ALT 13 06/11/2017 0753   BILITOT 0.3 05/19/2019 1158   BILITOT 0.35 06/11/2017 0753        Results for DOAA, KENDZIERSKI (MRN [id number]) as of 06/23/2019 11:48  Ref. Range 04/22/2019 10:17 05/19/2019 11:58  M Protein SerPl Elph-Mcnc Latest Ref Range: Not Observed g/dL 2.4 (H) 2.9 (H)     Results for THIRZA, PELLICANO (MRN [id number]) as of 06/23/2019 11:48  Ref. Range 04/22/2019 10:17 05/19/2019 11:58  IgG (Immunoglobin G), Serum Latest Ref Range: 586 - 1,602 mg/dL 3,120 (H) 3,627 (H)     Impression and Plan:  78 year old woman with:  1.  Multiple myeloma diagnosed in 2019.  She presented IgG lambda arising from MGUS previously.  She has been off treatment for the time being given dermatological toxicity she experienced from both Revlimid and Pomalyst.  Different salvage therapy options were reviewed including daratumumab based regimens as well as Ninlaro as an oral therapy as an alternative.  Risks and benefits of all these approaches were reviewed at this time.  Given the fact that she is asymptomatic from her disease with stable hematological and  electrolyte parameters I have recommended a short period of observation and restart therapy if she start developing symptomatic disease.  I anticipate starting that in the near future before she develops severe cytopenias.  Complications associated with Ninlaro at 4 mg once a week on day 1, 8 and 15 out of a 28-day cycle with dexamethasone was discussed.  Hematological toxicity including neutropenia, thrombocytopenia as well as dermatological toxicity were reviewed.    2.  Anemia: Hemoglobin close to normal range at this time and will be monitored periodically.  3. Osteoporosis and compression fracture: She has declined Zometa at this time.  Continue to monitor and reinstitute if she develops a worsening bone disease.  4.  Herpes zoster reactivation: No recent reactivation noted at this time and he is off acyclovir.   5.  Rash: Related to plasma cell disorder and previous treatment.  Appears to be  improving.   6. Follow-up: She will return in 4 weeks for repeat evaluation.  25  minutes was spent with the patient face-to-face today.  More than 50% of time was dedicated to reviewing laboratory data, disease status update as well as management options for the future.  Zola Button, MD 11/12/202011:46 AM

## 2019-06-24 ENCOUNTER — Telehealth: Payer: Self-pay | Admitting: Oncology

## 2019-06-24 LAB — KAPPA/LAMBDA LIGHT CHAINS
Kappa free light chain: 11 mg/L (ref 3.3–19.4)
Kappa, lambda light chain ratio: 0.07 — ABNORMAL LOW (ref 0.26–1.65)
Lambda free light chains: 152.3 mg/L — ABNORMAL HIGH (ref 5.7–26.3)

## 2019-06-24 NOTE — Telephone Encounter (Signed)
Scheduled appt per 11/12 los. ° °Spoke with pt and she is aware of her appt date and time. °

## 2019-06-25 LAB — MULTIPLE MYELOMA PANEL, SERUM
Albumin SerPl Elph-Mcnc: 3.7 g/dL (ref 2.9–4.4)
Albumin/Glob SerPl: 0.8 (ref 0.7–1.7)
Alpha 1: 0.2 g/dL (ref 0.0–0.4)
Alpha2 Glob SerPl Elph-Mcnc: 0.6 g/dL (ref 0.4–1.0)
B-Globulin SerPl Elph-Mcnc: 0.8 g/dL (ref 0.7–1.3)
Gamma Glob SerPl Elph-Mcnc: 3.6 g/dL — ABNORMAL HIGH (ref 0.4–1.8)
Globulin, Total: 5.2 g/dL — ABNORMAL HIGH (ref 2.2–3.9)
IgA: 49 mg/dL — ABNORMAL LOW (ref 64–422)
IgG (Immunoglobin G), Serum: 4321 mg/dL — ABNORMAL HIGH (ref 586–1602)
IgM (Immunoglobulin M), Srm: 41 mg/dL (ref 26–217)
M Protein SerPl Elph-Mcnc: 3.4 g/dL — ABNORMAL HIGH
Total Protein ELP: 8.9 g/dL — ABNORMAL HIGH (ref 6.0–8.5)

## 2019-07-20 ENCOUNTER — Other Ambulatory Visit: Payer: Self-pay

## 2019-07-20 ENCOUNTER — Inpatient Hospital Stay: Payer: Medicare Other | Attending: Oncology

## 2019-07-20 ENCOUNTER — Inpatient Hospital Stay (HOSPITAL_BASED_OUTPATIENT_CLINIC_OR_DEPARTMENT_OTHER): Payer: Medicare Other | Admitting: Oncology

## 2019-07-20 VITALS — BP 141/77 | HR 72 | Temp 98.9°F | Resp 18 | Ht 66.0 in | Wt 138.4 lb

## 2019-07-20 DIAGNOSIS — D649 Anemia, unspecified: Secondary | ICD-10-CM | POA: Insufficient documentation

## 2019-07-20 DIAGNOSIS — D472 Monoclonal gammopathy: Secondary | ICD-10-CM

## 2019-07-20 DIAGNOSIS — M4850XA Collapsed vertebra, not elsewhere classified, site unspecified, initial encounter for fracture: Secondary | ICD-10-CM | POA: Insufficient documentation

## 2019-07-20 DIAGNOSIS — R21 Rash and other nonspecific skin eruption: Secondary | ICD-10-CM | POA: Insufficient documentation

## 2019-07-20 DIAGNOSIS — Z79899 Other long term (current) drug therapy: Secondary | ICD-10-CM | POA: Insufficient documentation

## 2019-07-20 DIAGNOSIS — L299 Pruritus, unspecified: Secondary | ICD-10-CM | POA: Diagnosis not present

## 2019-07-20 DIAGNOSIS — C9000 Multiple myeloma not having achieved remission: Secondary | ICD-10-CM

## 2019-07-20 DIAGNOSIS — M810 Age-related osteoporosis without current pathological fracture: Secondary | ICD-10-CM | POA: Diagnosis not present

## 2019-07-20 LAB — CBC WITH DIFFERENTIAL (CANCER CENTER ONLY)
Abs Immature Granulocytes: 0.07 10*3/uL (ref 0.00–0.07)
Basophils Absolute: 0.1 10*3/uL (ref 0.0–0.1)
Basophils Relative: 1 %
Eosinophils Absolute: 0.1 10*3/uL (ref 0.0–0.5)
Eosinophils Relative: 1 %
HCT: 34.8 % — ABNORMAL LOW (ref 36.0–46.0)
Hemoglobin: 11.5 g/dL — ABNORMAL LOW (ref 12.0–15.0)
Immature Granulocytes: 1 %
Lymphocytes Relative: 30 %
Lymphs Abs: 2.4 10*3/uL (ref 0.7–4.0)
MCH: 31.9 pg (ref 26.0–34.0)
MCHC: 33 g/dL (ref 30.0–36.0)
MCV: 96.4 fL (ref 80.0–100.0)
Monocytes Absolute: 0.8 10*3/uL (ref 0.1–1.0)
Monocytes Relative: 11 %
Neutro Abs: 4.3 10*3/uL (ref 1.7–7.7)
Neutrophils Relative %: 56 %
Platelet Count: 359 10*3/uL (ref 150–400)
RBC: 3.61 MIL/uL — ABNORMAL LOW (ref 3.87–5.11)
RDW: 14 % (ref 11.5–15.5)
WBC Count: 7.7 10*3/uL (ref 4.0–10.5)
nRBC: 0 % (ref 0.0–0.2)

## 2019-07-20 LAB — CMP (CANCER CENTER ONLY)
ALT: 14 U/L (ref 0–44)
AST: 19 U/L (ref 15–41)
Albumin: 2.9 g/dL — ABNORMAL LOW (ref 3.5–5.0)
Alkaline Phosphatase: 47 U/L (ref 38–126)
Anion gap: 5 (ref 5–15)
BUN: 10 mg/dL (ref 8–23)
CO2: 28 mmol/L (ref 22–32)
Calcium: 8.7 mg/dL — ABNORMAL LOW (ref 8.9–10.3)
Chloride: 103 mmol/L (ref 98–111)
Creatinine: 0.82 mg/dL (ref 0.44–1.00)
GFR, Est AFR Am: >60 mL/min (ref >60)
GFR, Estimated: >60 mL/min (ref >60)
Glucose, Bld: 95 mg/dL (ref 70–99)
Potassium: 4 mmol/L (ref 3.5–5.1)
Sodium: 136 mmol/L (ref 135–145)
Total Bilirubin: 0.3 mg/dL (ref 0.3–1.2)
Total Protein: 9.5 g/dL — ABNORMAL HIGH (ref 6.5–8.1)

## 2019-07-20 MED ORDER — DEXAMETHASONE 4 MG PO TABS: ORAL_TABLET | ORAL | 3 refills | Status: DC

## 2019-07-20 MED ORDER — ACYCLOVIR 400 MG PO TABS: 400.0000 mg | ORAL_TABLET | Freq: Every day | ORAL | 0 refills | Status: DC

## 2019-07-20 MED ORDER — IXAZOMIB CITRATE 4 MG PO CAPS: ORAL_CAPSULE | ORAL | 1 refills | Status: DC

## 2019-07-20 NOTE — Progress Notes (Signed)
Hematology and Oncology Follow Up Visit  Nicole Keith [id number] May 18, 1941 78 y.o. 07/20/2019 12:28 PM Nicole Keith, [name], [name], MD   Principle Diagnosis: 78 year old woman with IgG lambda multiple myeloma diagnosed in 2019.  She was found to have a 30% plasma cell involvement after diagnosis of MGUS previously.   Past therapy:    Velcade and dexamethasone started weekly on 07/27/2018.  Therapy changed in February 2020.  Revlimid 25 mg for 21 days out of a 28-day cycle with dexamethasone started in February 2020.  Therapy was held after 2 cycles because of severe dermatological toxicity.  Pomalyst 1 mg daily for 21 days started on May 1 of 2020.  She completed 4 cycles of therapy.  Current therapy: Active surveillance with anticipated start therapy.  Interim History:  Nicole Keith is here for return evaluation.  Since the last visit, she reports no major changes in her health.  She continues to get stronger and healthier and eats better since the last visit.  She however continues to have issues with pruritus fine maculopapular rash on her arms and chest wall.  She denies any other constitutional symptoms at this time.  She denies any nausea or vomiting.  Her performance status and quality of life remains unchanged.   Patient denied headaches, blurry vision, syncope or seizures.  Denies any fevers, chills or sweats.  Denied chest pain, palpitation, orthopnea or leg edema.  Denied cough, wheezing or hemoptysis.  Denied nausea, vomiting or abdominal pain.  Denies any constipation or diarrhea.  Denies any frequency urgency or hesitancy.  Denies any arthralgias or myalgias.  Denies any skin rashes or lesions.  Denies any bleeding or clotting tendency.  Denies any easy bruising.  Denies any hair or nail changes.  Denies any anxiety or depression.  Remaining review of system is negative.            Medications: Reviewed without any changes at this time. Current Outpatient  Medications  Medication Sig Dispense Refill  . Olopatadine HCl 0.2 % SOLN INSTILL 1 DROP INTO EACH EYE ONCE DAILY     No current facility-administered medications for this visit.      Allergies:  Allergies  Allergen Reactions  . Revlimid [Lenalidomide] Rash  . Betadine [Povidone Iodine] Rash  . Flagyl [Metronidazole] Diarrhea  . Fosamax [Alendronate Sodium] Other (See Comments)    GI distress  . Bactrim [Sulfamethoxazole-Trimethoprim]     nausea  . Doxepin Hcl     Irritable, hallucinations  . Hydroxyzine Hcl     hallucinations  . Valium [Diazepam] Other (See Comments)  . Actonel [Risedronate Sodium] Other (See Comments)    GI distress  . Boniva [Ibandronic Acid] Other (See Comments)    GI distress  . Doxycycline Rash  . Penicillins Rash    Ancef was OK.    Past Medical History, Surgical history, Social history, and Family History unchanged on review.  Physical Exam:     Blood pressure (!) 141/77, pulse 72, temperature 98.9 F (37.2 C), temperature source Temporal, resp. rate 18, height _0  (1.676 m), weight 138 lb 6.4 oz (62.8 kg), SpO2 97 %.     ECOG: 1   General appearance: Alert, awake without any distress. Head: Atraumatic without abnormalities Oropharynx: Without any thrush or ulcers. Eyes: No scleral icterus. Lymph nodes: No lymphadenopathy noted in the cervical, supraclavicular, or axillary nodes Heart:regular rate and rhythm, without any murmurs or gallops.   Lung: Clear to auscultation without any rhonchi, wheezes or dullness to  percussion. Abdomin: Soft, nontender without any shifting dullness or ascites. Musculoskeletal: No clubbing or cyanosis. Neurological: No motor or sensory deficits. Skin: No rashes or lesions.          Lab Results: Lab Results  Component Value Date   WBC 8.1 06/23/2019   HGB 11.9 (L) 06/23/2019   HCT 36.7 06/23/2019   MCV 95.8 06/23/2019   PLT 327 06/23/2019     Chemistry      Component Value Date/Time    NA 136 06/23/2019 1121   NA 135 (L) 06/11/2017 0753   K 3.9 06/23/2019 1121   K 4.0 06/11/2017 0753   CL 101 06/23/2019 1121   CO2 28 06/23/2019 1121   CO2 26 06/11/2017 0753   BUN 10 06/23/2019 1121   BUN 12.8 06/11/2017 0753   CREATININE 0.83 06/23/2019 1121   CREATININE 0.8 06/11/2017 0753      Component Value Date/Time   CALCIUM 8.8 (L) 06/23/2019 1121   CALCIUM 8.8 06/11/2017 0753   ALKPHOS 47 06/23/2019 1121   ALKPHOS 72 06/11/2017 0753   AST 18 06/23/2019 1121   AST 21 06/11/2017 0753   ALT 14 06/23/2019 1121   ALT 13 06/11/2017 0753   BILITOT 0.3 06/23/2019 1121   BILITOT 0.35 06/11/2017 0753       Results for Nicole Keith (MRN [id number]) as of 07/20/2019 12:29  Ref. Range 05/19/2019 11:58 06/23/2019 11:21 06/23/2019 11:22  M Protein SerPl Elph-Mcnc Latest Ref Range: Not Observed g/dL 2.9 (H)  3.4 (H)   Results for Nicole Keith (MRN [id number]) as of 07/20/2019 12:29  Ref. Range 05/19/2019 11:58 06/23/2019 11:21 06/23/2019 11:22  IgG (Immunoglobin G), Serum Latest Ref Range: 586 - 1,602 mg/dL 3,627 (H)  4,321 (H)     Impression and Plan:  78 year old woman with:  1.  IgG lambda multiple myeloma diagnosed in 2019 arising from MGUS.  She had a poor tolerance to previous therapy as outlined above with dermatological toxicities associated with all Revlimid and Pomalyst.  She also had a poor response to Velcade initially.  Options of therapy were reiterated today again including Ninlaro alone or in combination with daratumumab.  After discussion today, we have elected to proceed with Ninlaro with dexamethasone and defer intravenous treatment for the time being based on her preferences.  Complication associated with this therapy including and neutropenia and thrombocytopenia, neuropathy, rash, edema and rarely hepatic dysfunction among others.  She is agreeable to proceed with 20 mg of weekly dexamethasone.  We will reevaluate her protein studies in 4  weeks.    2.  Anemia: Slight decline indicating possible progression of disease.  3. Osteoporosis and compression fracture: She has received Zometa in the past and has deferred for the time being.  4.  Herpes zoster reactivation: I recommend restarting acyclovir for VZV prophylaxis.   5.  Rash: Unclear etiology.  Could be related to her multiple myeloma or previous treatment.  She has a dermatology follow-up in the near future.   6. Follow-up: Will be in 4 weeks for repeat evaluation.  25  minutes was spent with the patient face-to-face today.  More than 50% of time was dedicated to reviewing laboratory data, disease status update as well as management options for the future.  Zola Button, MD 12/9/202012:28 PM

## 2019-07-20 NOTE — Addendum Note (Signed)
Addended by: Wyatt Portela on: 07/20/2019 01:02 PM   Modules accepted: Orders

## 2019-07-21 ENCOUNTER — Telehealth: Payer: Self-pay | Admitting: Oncology

## 2019-07-21 ENCOUNTER — Telehealth: Payer: Self-pay

## 2019-07-21 ENCOUNTER — Telehealth: Payer: Self-pay | Admitting: Pharmacist

## 2019-07-21 LAB — KAPPA/LAMBDA LIGHT CHAINS
Kappa free light chain: 8.8 mg/L (ref 3.3–19.4)
Kappa, lambda light chain ratio: 0.06 — ABNORMAL LOW (ref 0.26–1.65)
Lambda free light chains: 151.7 mg/L — ABNORMAL HIGH (ref 5.7–26.3)

## 2019-07-21 NOTE — Telephone Encounter (Signed)
Oral Oncology Pharmacist Encounter  Received new prescription for Ninlaro (ixazomib) for the salvage treatment of multiple myeloma in conjunction with dexamethasone, planned duration until disease progression or unacceptable drug toxicity.  CMP from 07/20/2019 assessed, no relevant lab abnormalities. Prescription dose and frequency assessed.   Current medication list in Epic reviewed, no DDIs with Ninlaro identified.  Prescription has been e-scribed to the [facility] for benefits analysis and approval.  Oral Oncology Clinic will continue to follow for insurance authorization, copayment issues, initial counseling and start date.  Darl Pikes, PharmD, BCPS, [facility] Hematology/Oncology Clinical Pharmacist [facility] [place] [facility] [phone]  07/21/2019 9:00 AM

## 2019-07-21 NOTE — Telephone Encounter (Signed)
Erroneous encounter  [place] Patient [place] Phone [phone] Fax [phone] 07/21/2019 9:43 AM

## 2019-07-21 NOTE — Telephone Encounter (Signed)
Scheduled appt per 12/9 los. ° °Spoke with pt and she is aware of the appt date and time °

## 2019-07-21 NOTE — Telephone Encounter (Signed)
Oral Oncology Patient Advocate Encounter  Received notification from [facility] that prior authorization for [name] is required.  PA submitted on CoverMyMeds Key [id number] Status is pending  Oral Oncology Clinic will continue to follow.  [place] Patient [place] Phone [phone] Fax [phone] 07/21/2019 9:45 AM

## 2019-07-21 NOTE — Telephone Encounter (Signed)
Oral Oncology Patient Advocate Encounter  Prior Authorization for Nicole Keith has been approved.    PA# [id number] Effective dates: 07/21/19 through 08/10/20  Patients co-pay is $552.84  Patient also has copay assistance through [employer], which makes copay $0  [facility] will continue to follow.   [place] Patient [place] Phone [phone] Fax [phone] 07/21/2019 10:05 AM

## 2019-07-22 MED FILL — NINLARO 4 MG CAP: 4 | 28 days supply | Qty: 3 | Fill #0

## 2019-07-25 LAB — MULTIPLE MYELOMA PANEL, SERUM
Albumin SerPl Elph-Mcnc: 3.3 g/dL (ref 2.9–4.4)
Albumin/Glob SerPl: 0.7 (ref 0.7–1.7)
Alpha 1: 0.3 g/dL (ref 0.0–0.4)
Alpha2 Glob SerPl Elph-Mcnc: 0.7 g/dL (ref 0.4–1.0)
B-Globulin SerPl Elph-Mcnc: 0.9 g/dL (ref 0.7–1.3)
Gamma Glob SerPl Elph-Mcnc: 3.6 g/dL — ABNORMAL HIGH (ref 0.4–1.8)
Globulin, Total: 5.4 g/dL — ABNORMAL HIGH (ref 2.2–3.9)
IgA: 49 mg/dL — ABNORMAL LOW (ref 64–422)
IgG (Immunoglobin G), Serum: 4715 mg/dL — ABNORMAL HIGH (ref 586–1602)
IgM (Immunoglobulin M), Srm: 42 mg/dL (ref 26–217)
M Protein SerPl Elph-Mcnc: 3.4 g/dL — ABNORMAL HIGH
Total Protein ELP: 8.7 g/dL — ABNORMAL HIGH (ref 6.0–8.5)

## 2019-07-25 NOTE — Telephone Encounter (Signed)
Oral Chemotherapy Pharmacist Encounter  Patient planned on picking up her medication Saturday 12/12 and starting her Ninlaro today 12/14.   Patient Education I spoke with patient Friday 07/22/2019 for overview of new oral chemotherapy medication: Ninlaro (ixazomib) for the treatment of multiple myeloma, planned duration until disease progression or unacceptable drug toxicity.   Counseled patient on administration, dosing, side effects, monitoring, drug-food interactions, safe handling, storage, and disposal. Patient will take Ninlaro 1 capsule (4 mg) by mouth weekly, 3 weeks on, 1 week off, repeat every 4 weeks. Take on an empty stomach 1hr before or 2hr after meals. She will also take dexamethasone 66m weekly.  Side effects include but not limited to: decreased wbc/plt, diarrhea, constipation.    Reviewed with patient importance of keeping a medication schedule and plan for any missed doses.  Ms. HRommelvoiced understanding and appreciation. All questions answered. Medication handout placed in the mail.  Provided [facility] with [facility]. Patient knows to call the office with questions or concerns. Oral Chemotherapy [facility] will continue to follow.  ADarl Pikes PharmD, BCPS, BLakeside Milam Recovery CenterHematology/Oncology Clinical Pharmacist [phone] 07/25/2019 9:47 AM

## 2019-08-04 IMAGING — MR MR THORACIC SPINE WO/W CM
8 of 18 series · 18 of 48 positions shown · IV contrast (Yes)
Comparison: None.

CLINICAL DATA: Acute back pain.  Multiple myeloma.

EXAM:
MRI THORACIC AND LUMBAR SPINE WITHOUT AND WITH CONTRAST
TECHNIQUE: Multiplanar and multiecho pulse sequences of the thoracic and lumbar
spine were obtained without and with intravenous contrast.
CONTRAST:  7 mL Gadavist

[Series 3: T1 · sagittal · 4.0mm · 0.51mm/px · 2 of 12 slices shown (1 of 2)]
[im 1/12]
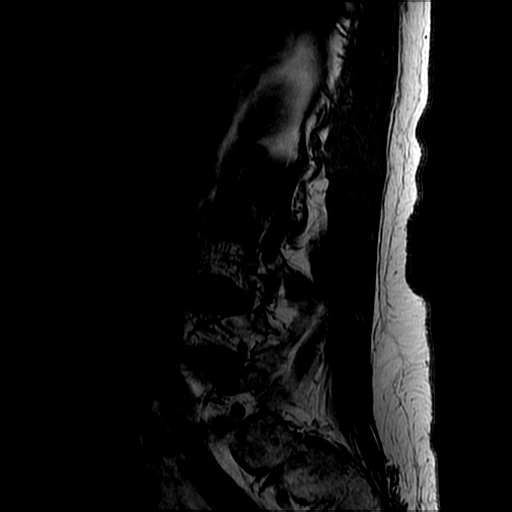
[im 12/12]
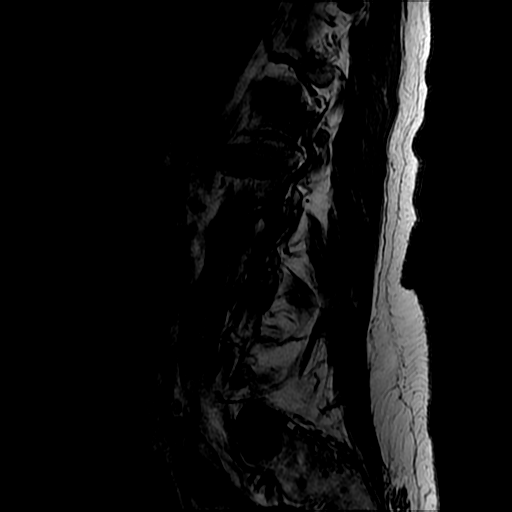

[Series 4: T2 post-contrast · sagittal · 4.0mm · 0.51mm/px · 2 of 12 slices shown (1 of 4)]
[im 1/12]
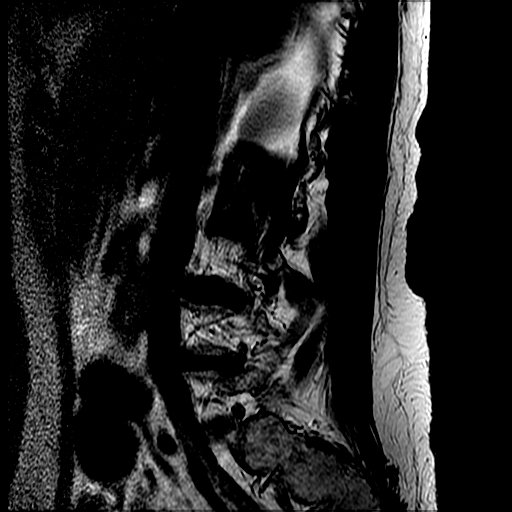
[im 12/12]
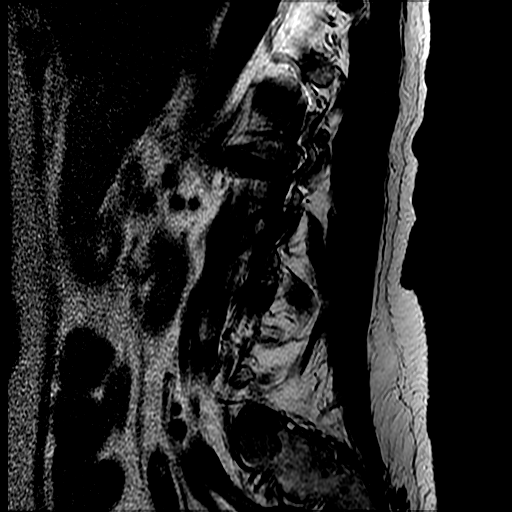

[Series 6: T2 · axial · 4.0mm · 0.39mm/px · z∈[-111,+101]mm · 5 of 38 slices shown (1 of 2)]
[im 1/38]
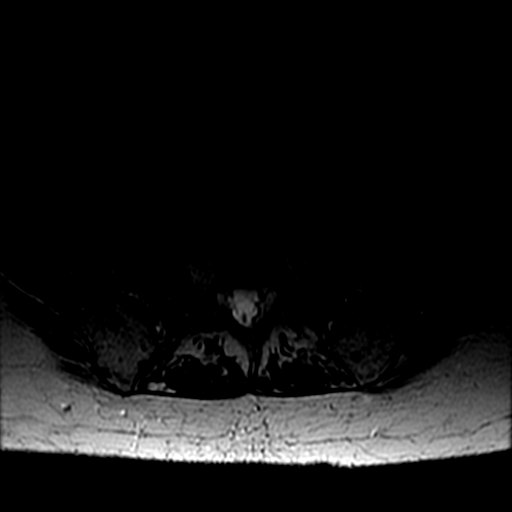
[im 10/38]
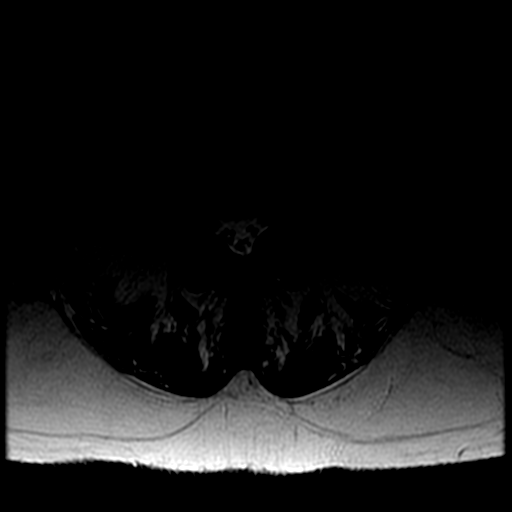
[im 19/38]
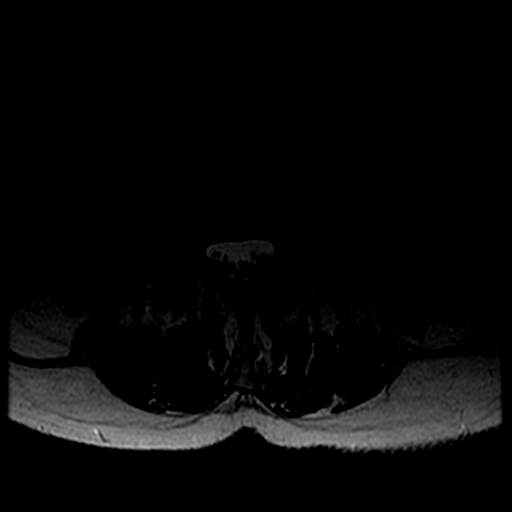
[im 28/38]
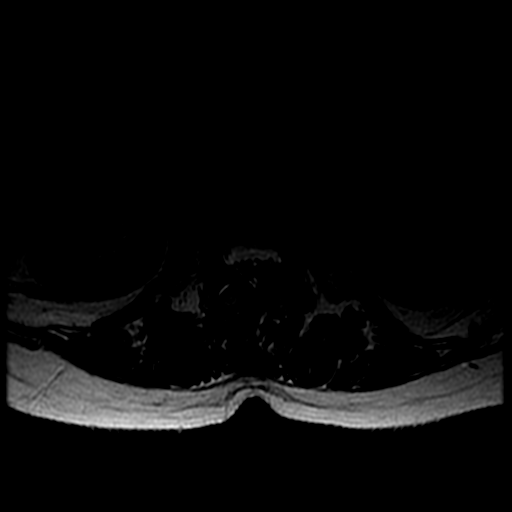
[im 38/38]
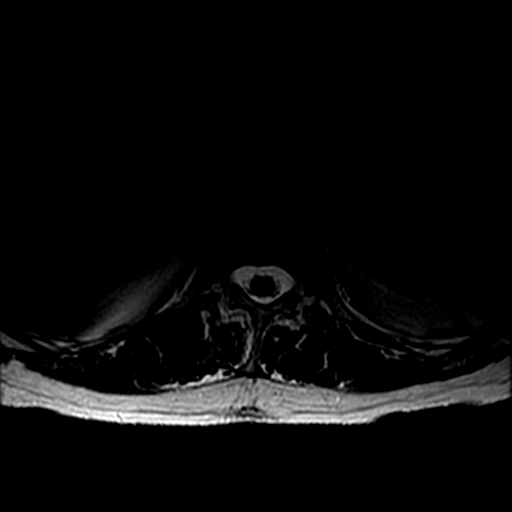

[Series 7: T1 · axial · 4.0mm · 0.39mm/px · 1 of 38 slices shown (2 of 2)]
[im 1/38]
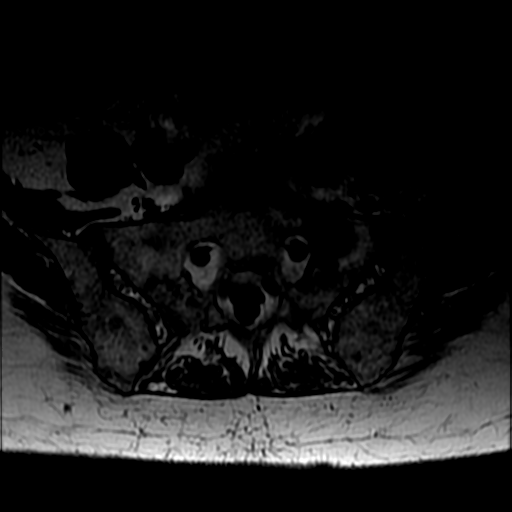

[Series 14: T2 post-contrast · sagittal · 3.0mm · 0.62mm/px · 1 of 14 slices shown (2 of 4)]
[im 1/14]
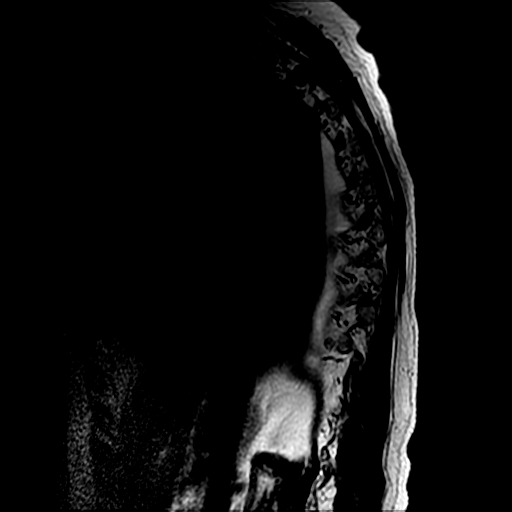

[Series 15: T2 · axial · 4.0mm · 0.39mm/px · z∈[+70,+267]mm · 5 of 51 slices shown (2 of 2)]
[im 1/51]
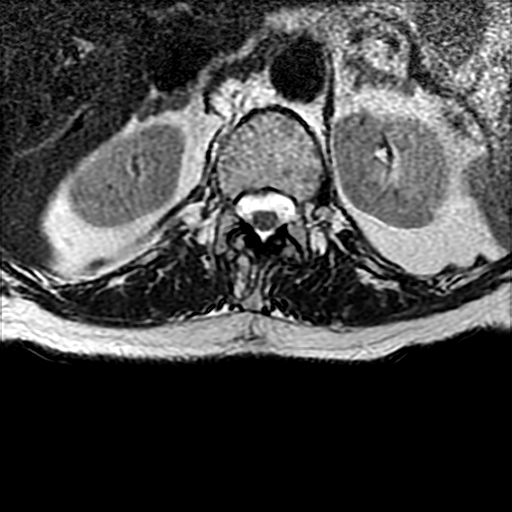
[im 13/51]
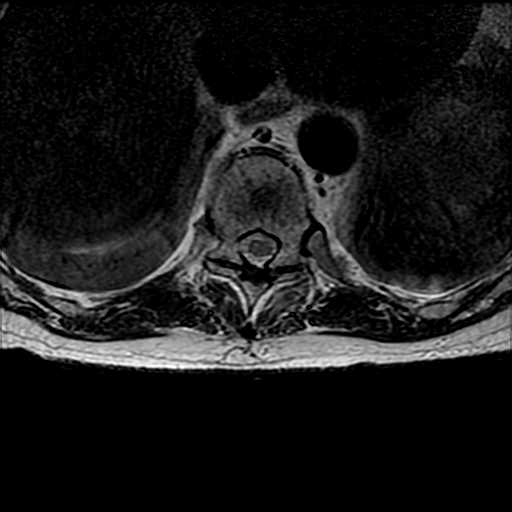
[im 26/51]
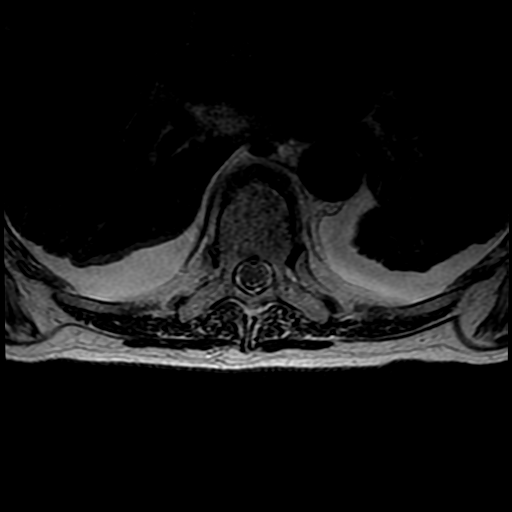
[im 38/51]
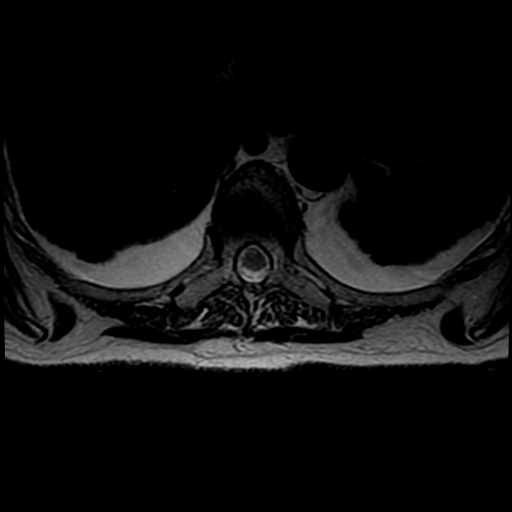
[im 51/51]
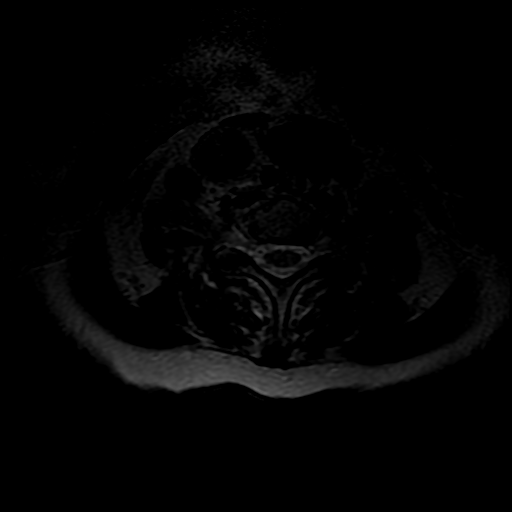

[Series 18: T2 post-contrast · sagittal · 3.0mm · 0.62mm/px · 1 of 14 slices shown (3 of 4)]
[im 1/14]
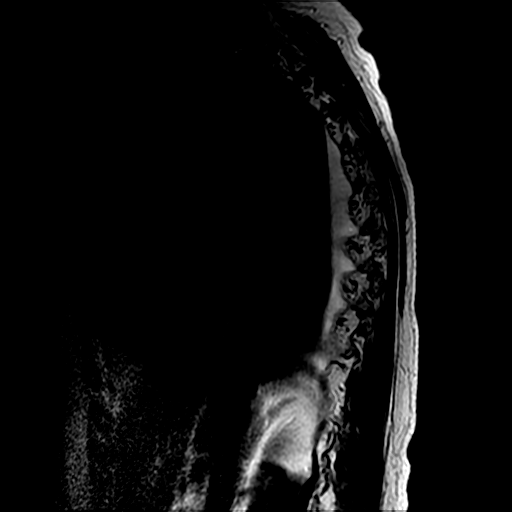

[Series 21: T2 post-contrast · sagittal · 4.0mm · 0.51mm/px · 1 of 12 slices shown (4 of 4)]
[im 1/12]
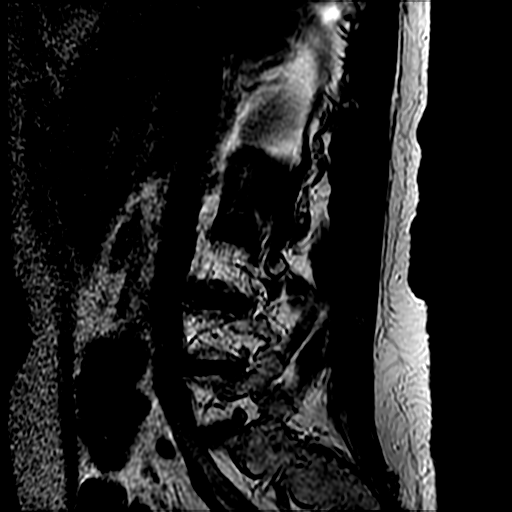

[18 of 48 positions shown; findings below may reference images not displayed]

FINDINGS: MRI THORACIC SPINE FINDINGS

Alignment:  Physiologic.

Vertebrae: There are low T1-weighted signal lesions throughout much
of the thoracic spine, including all levels at T5 and below. The
largest lesions are at T7, T8 and T9. At T8, there is approximately
50% height loss, unchanged from the radiograph of 07/12/2018. the
lesions at T8 and T9 show intermediate contrast-enhancement. There
is faint contrast enhancement at the other lesions, and mild
increase in T2-weighted signal. There is also a lesion of the left
T3 transverse process.

Cord:  Normal

Paraspinal and other soft tissues: There are intermediate sized
pleural effusions.

Disc levels:

There is no spinal canal stenosis. No contrast enhancing epidural
abnormality.

MRI LUMBAR SPINE FINDINGS

Segmentation:  Standard.

Alignment:  Physiologic.

Vertebrae: There are numerous lesions throughout the lumbar spine,
the largest of which is located at L4. There is a large superior
endplate depression at L4 with minimal marrow edema. The degree of
height loss is greater than on the radiograph of 07/12/2018. There
is moderate contrast enhancement at the level. The other, smaller
lesions, show more faint contrast enhancement. There is an
additional large lesion at the left S1 level.

Conus medullaris: Extends to the L2 level and appears normal.

Paraspinal and other soft tissues: Negative

Disc levels:

L1-2: Mild disc bulge and endplate spurring. No stenosis of the
spinal canal.

L2-3: Mild disc bulge without spinal canal stenosis. No neural
impingement.

L3-4: Mild disc bulge without spinal canal stenosis or neural
impingement.

L4-5: Mild spinal canal stenosis and narrowing of both lateral
recesses due to endplate spurring and small disc bulge. No neural
impingement.

L5-S1: Mild disc bulge without spinal canal stenosis or neural
impingement.
IMPRESSION: 1. Widespread bone marrow lesions in a pattern consistent with
multiple myeloma. The largest lesions are located at T7, T8, T9, L4
and S1.
2. Pathologic compression deformities of indeterminate age at T8, T9
and L4. L4 height loss appears slightly worsened from the 07/12/2018
radiograph. The T8 and T9 lesions are unchanged.
3. No high-grade spinal canal stenosis or contrast enhancing
epidural disease.
4. Bilateral pleural effusions.

## 2019-08-18 ENCOUNTER — Other Ambulatory Visit: Payer: Self-pay

## 2019-08-18 DIAGNOSIS — C9000 Multiple myeloma not having achieved remission: Secondary | ICD-10-CM

## 2019-08-19 ENCOUNTER — Inpatient Hospital Stay (HOSPITAL_BASED_OUTPATIENT_CLINIC_OR_DEPARTMENT_OTHER): Payer: Medicare Other | Admitting: Oncology

## 2019-08-19 ENCOUNTER — Telehealth: Payer: Self-pay

## 2019-08-19 ENCOUNTER — Inpatient Hospital Stay: Payer: Medicare Other | Attending: Oncology

## 2019-08-19 ENCOUNTER — Other Ambulatory Visit: Payer: Self-pay

## 2019-08-19 VITALS — BP 146/74 | HR 82 | Temp 98.7°F | Resp 18 | Ht 66.0 in | Wt 140.5 lb

## 2019-08-19 DIAGNOSIS — B029 Zoster without complications: Secondary | ICD-10-CM | POA: Diagnosis not present

## 2019-08-19 DIAGNOSIS — C9000 Multiple myeloma not having achieved remission: Secondary | ICD-10-CM

## 2019-08-19 LAB — CBC WITH DIFFERENTIAL (CANCER CENTER ONLY)
Abs Immature Granulocytes: 0.14 10*3/uL — ABNORMAL HIGH (ref 0.00–0.07)
Basophils Absolute: 0.1 10*3/uL (ref 0.0–0.1)
Basophils Relative: 1 %
Eosinophils Absolute: 0.2 10*3/uL (ref 0.0–0.5)
Eosinophils Relative: 2 %
HCT: 36.4 % (ref 36.0–46.0)
Hemoglobin: 12.2 g/dL (ref 12.0–15.0)
Immature Granulocytes: 2 %
Lymphocytes Relative: 25 %
Lymphs Abs: 2.4 10*3/uL (ref 0.7–4.0)
MCH: 32 pg (ref 26.0–34.0)
MCHC: 33.5 g/dL (ref 30.0–36.0)
MCV: 95.5 fL (ref 80.0–100.0)
Monocytes Absolute: 0.9 10*3/uL (ref 0.1–1.0)
Monocytes Relative: 9 %
Neutro Abs: 5.9 10*3/uL (ref 1.7–7.7)
Neutrophils Relative %: 61 %
Platelet Count: 343 10*3/uL (ref 150–400)
RBC: 3.81 MIL/uL — ABNORMAL LOW (ref 3.87–5.11)
RDW: 14.1 % (ref 11.5–15.5)
WBC Count: 9.6 10*3/uL (ref 4.0–10.5)
nRBC: 0 % (ref 0.0–0.2)

## 2019-08-19 LAB — CMP (CANCER CENTER ONLY)
ALT: 17 U/L (ref 0–44)
AST: 16 U/L (ref 15–41)
Albumin: 3.1 g/dL — ABNORMAL LOW (ref 3.5–5.0)
Alkaline Phosphatase: 48 U/L (ref 38–126)
Anion gap: 8 (ref 5–15)
BUN: 11 mg/dL (ref 8–23)
CO2: 24 mmol/L (ref 22–32)
Calcium: 8.3 mg/dL — ABNORMAL LOW (ref 8.9–10.3)
Chloride: 102 mmol/L (ref 98–111)
Creatinine: 0.89 mg/dL (ref 0.44–1.00)
GFR, Est AFR Am: >60 mL/min (ref >60)
GFR, Estimated: >60 mL/min (ref >60)
Glucose, Bld: 114 mg/dL — ABNORMAL HIGH (ref 70–99)
Potassium: 3.8 mmol/L (ref 3.5–5.1)
Sodium: 134 mmol/L — ABNORMAL LOW (ref 135–145)
Total Bilirubin: 0.3 mg/dL (ref 0.3–1.2)
Total Protein: 9.9 g/dL — ABNORMAL HIGH (ref 6.5–8.1)

## 2019-08-19 NOTE — Progress Notes (Signed)
Hematology and Oncology Follow Up Visit  Nicole Keith [id number] December 07, 1940 79 y.o. 08/19/2019 11:34 AM Nicole Keith, [name], [name], MD   Principle Diagnosis: 79 year old woman with multiple myeloma arising from MGUS diagnosed in 2019.  She was found to have IgG lambda with 30% involvement in the bone marrow. Past therapy:    Velcade and dexamethasone started weekly on 07/27/2018.  Therapy changed in February 2020.  Revlimid 25 mg for 21 days out of a 28-day cycle with dexamethasone started in February 2020.  Therapy was held after 2 cycles because of severe dermatological toxicity.  Pomalyst 1 mg daily for 21 days started on May 1 of 2020.  She completed 4 cycles of therapy.  Current therapy: Ninlaro 4 mg weekly with dexamethasone 20 mg weekly for 3 weeks on and 1 week off started in December 2020.  Interim History:  Nicole Keith is here for return evaluation.  Since the last visit, she completed the first cycle of Ninlaro with dexamethasone without any major complications.  She denies any nausea, vomiting or worsening neuropathy.  Her performance status and activity level remained excellent.  She reports improvement in her rash and has tolerated acyclovir without any issues.           Medications: Without any changes on review. Current Outpatient Medications  Medication Sig Dispense Refill  . acyclovir (ZOVIRAX) 400 MG tablet Take 1 tablet (400 mg total) by mouth daily. 60 tablet 0  . dexamethasone (DECADRON) 4 MG tablet Take 5 tabs weekly. 60 40 tablet 3  . ixazomib citrate (NINLARO) 4 MG capsule Take 1 capsule (4 mg) by mouth weekly, 3 weeks on, 1 week off, repeat every 4 weeks. Take on an empty stomach 1hr before or 2hr after meals. 3 capsule 1  . Olopatadine HCl 0.2 % SOLN INSTILL 1 DROP INTO EACH EYE ONCE DAILY     No current facility-administered medications for this visit.     Allergies:  Allergies  Allergen Reactions  . Revlimid [Lenalidomide] Rash  . Betadine  [Povidone Iodine] Rash  . Flagyl [Metronidazole] Diarrhea  . Fosamax [Alendronate Sodium] Other (See Comments)    GI distress  . Bactrim [Sulfamethoxazole-Trimethoprim]     nausea  . Doxepin Hcl     Irritable, hallucinations  . Hydroxyzine Hcl     hallucinations  . Valium [Diazepam] Other (See Comments)  . Actonel [Risedronate Sodium] Other (See Comments)    GI distress  . Boniva [Ibandronic Acid] Other (See Comments)    GI distress  . Doxycycline Rash  . Penicillins Rash    Ancef was OK.    Past Medical History, Surgical history, Social history, and Family History updated without any changes.  Physical Exam:     Blood pressure (!) 146/74, pulse 82, temperature 98.7 F (37.1 C), temperature source Temporal, resp. rate 18, height '5\' 6"'  (1.676 m), weight 140 lb 8 oz (63.7 kg), SpO2 97 %.      ECOG: 1    General appearance: Comfortable appearing without any discomfort Head: Normocephalic without any trauma Oropharynx: Mucous membranes are moist and pink without any thrush or ulcers. Eyes: Pupils are equal and round reactive to light. Lymph nodes: No cervical, supraclavicular, inguinal or axillary lymphadenopathy.   Heart:regular rate and rhythm.  S1 and S2 without leg edema. Lung: Clear without any rhonchi or wheezes.  No dullness to percussion. Abdomin: Soft, nontender, nondistended with good bowel sounds.  No hepatosplenomegaly. Musculoskeletal: No joint deformity or effusion.  Full range of motion  noted. Neurological: No deficits noted on motor, sensory and deep tendon reflex exam. Skin: No petechial rash or dryness.  Appeared moist.            Lab Results: Lab Results  Component Value Date   WBC 9.6 08/19/2019   HGB 12.2 08/19/2019   HCT 36.4 08/19/2019   MCV 95.5 08/19/2019   PLT 343 08/19/2019     Chemistry      Component Value Date/Time   NA 136 07/20/2019 1215   NA 135 (L) 06/11/2017 0753   K 4.0 07/20/2019 1215   K 4.0 06/11/2017 0753    CL 103 07/20/2019 1215   CO2 28 07/20/2019 1215   CO2 26 06/11/2017 0753   BUN 10 07/20/2019 1215   BUN 12.8 06/11/2017 0753   CREATININE 0.82 07/20/2019 1215   CREATININE 0.8 06/11/2017 0753      Component Value Date/Time   CALCIUM 8.7 (L) 07/20/2019 1215   CALCIUM 8.8 06/11/2017 0753   ALKPHOS 47 07/20/2019 1215   ALKPHOS 72 06/11/2017 0753   AST 19 07/20/2019 1215   AST 21 06/11/2017 0753   ALT 14 07/20/2019 1215   ALT 13 06/11/2017 0753   BILITOT 0.3 07/20/2019 1215   BILITOT 0.35 06/11/2017 0753           Impression and Plan:  79 year old woman with:  1.  Multiple myeloma diagnosed in 2019.  She was found to have IgG lambda with a 30% involvement in the bone marrow and worsening anemia.  She has started salvage Ninlaro and completing the first cycle of therapy.  Risks and benefits of continuing this therapy for the time being was reviewed.  Potential complications that include GI toxicity as well as cytopenias were reviewed.  At this time, she is agreeable to continue with this treatment.  We will continue to monitor her protein studies regularly.  Different salvage therapy will be used if she has less than adequate response.   2.  Anemia: Hemoglobin is normal at this time without any intervention.  3. Osteoporosis and compression fracture: She has deferred the option of resuming Zometa for the time being.  4.  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   5.  Rash: Medication related although it is currently not completely clear.  He did have an dermatological toxicity with Revlimid although this has been discontinued for a while.  Improved at this time.   6. Follow-up: In 1 month for a repeat evaluation.  30  minutes was spent on this encounter.  The time was dedicated to reviewing laboratory data, disease status update and addressing complications related to her cancer and cancer therapy.   Zola Button, MD 1/8/202111:34 AM

## 2019-08-19 NOTE — Telephone Encounter (Signed)
Oral Oncology Patient Advocate Encounter  Was successful in securing patient a $11000 grant from [employer] to provide copayment coverage for [employer].  This will keep the out of pocket expense at $0.     [employer] ID: [id number]  I have spoken with the patient.   The billing information is as follows and has been shared with [place].    RxBin: [id number] PCN: [id number] Member [id number]: [id number]  Group ID: [id number] Dates of Eligibility: 08/22/19 through 08/20/20  [place] Patient [place] Phone [phone] Fax [phone] 08/19/2019 11:10 AM

## 2019-08-22 ENCOUNTER — Telehealth: Payer: Self-pay | Admitting: Oncology

## 2019-08-22 LAB — KAPPA/LAMBDA LIGHT CHAINS
Kappa free light chain: 9.1 mg/L (ref 3.3–19.4)
Kappa, lambda light chain ratio: 0.05 — ABNORMAL LOW (ref 0.26–1.65)
Lambda free light chains: 170.5 mg/L — ABNORMAL HIGH (ref 5.7–26.3)

## 2019-08-22 MED FILL — NINLARO 4 MG CAP: 4 | 28 days supply | Qty: 3 | Fill #1

## 2019-08-22 NOTE — Telephone Encounter (Signed)
Scheduled appt per 1/8 los.  Sent a message to HIM pool to get a calendar mailed out. 

## 2019-08-24 LAB — MULTIPLE MYELOMA PANEL, SERUM
Albumin SerPl Elph-Mcnc: 3.5 g/dL (ref 2.9–4.4)
Albumin/Glob SerPl: 0.7 (ref 0.7–1.7)
Alpha 1: 0.2 g/dL (ref 0.0–0.4)
Alpha2 Glob SerPl Elph-Mcnc: 0.7 g/dL (ref 0.4–1.0)
B-Globulin SerPl Elph-Mcnc: 0.9 g/dL (ref 0.7–1.3)
Gamma Glob SerPl Elph-Mcnc: 3.9 g/dL — ABNORMAL HIGH (ref 0.4–1.8)
Globulin, Total: 5.7 g/dL — ABNORMAL HIGH (ref 2.2–3.9)
IgA: 43 mg/dL — ABNORMAL LOW (ref 64–422)
IgG (Immunoglobin G), Serum: 4982 mg/dL — ABNORMAL HIGH (ref 586–1602)
IgM (Immunoglobulin M), Srm: 39 mg/dL (ref 26–217)
M Protein SerPl Elph-Mcnc: 3.7 g/dL — ABNORMAL HIGH
Total Protein ELP: 9.2 g/dL — ABNORMAL HIGH (ref 6.0–8.5)

## 2019-09-12 ENCOUNTER — Other Ambulatory Visit: Payer: Self-pay | Admitting: Oncology

## 2019-09-16 ENCOUNTER — Telehealth: Payer: Self-pay | Admitting: Oncology

## 2019-09-16 ENCOUNTER — Other Ambulatory Visit: Payer: Self-pay

## 2019-09-16 ENCOUNTER — Inpatient Hospital Stay: Payer: Medicare Other

## 2019-09-16 ENCOUNTER — Inpatient Hospital Stay: Payer: Medicare Other | Attending: Oncology | Admitting: Oncology

## 2019-09-16 VITALS — BP 144/75 | HR 85 | Temp 98.3°F | Resp 18 | Ht 66.0 in | Wt 141.9 lb

## 2019-09-16 DIAGNOSIS — B029 Zoster without complications: Secondary | ICD-10-CM | POA: Insufficient documentation

## 2019-09-16 DIAGNOSIS — Z79899 Other long term (current) drug therapy: Secondary | ICD-10-CM | POA: Diagnosis not present

## 2019-09-16 DIAGNOSIS — C9000 Multiple myeloma not having achieved remission: Secondary | ICD-10-CM | POA: Insufficient documentation

## 2019-09-16 DIAGNOSIS — D649 Anemia, unspecified: Secondary | ICD-10-CM | POA: Insufficient documentation

## 2019-09-16 DIAGNOSIS — M810 Age-related osteoporosis without current pathological fracture: Secondary | ICD-10-CM | POA: Insufficient documentation

## 2019-09-16 DIAGNOSIS — R21 Rash and other nonspecific skin eruption: Secondary | ICD-10-CM | POA: Insufficient documentation

## 2019-09-16 LAB — CBC WITH DIFFERENTIAL (CANCER CENTER ONLY)
Abs Immature Granulocytes: 0.14 10*3/uL — ABNORMAL HIGH (ref 0.00–0.07)
Basophils Absolute: 0.1 10*3/uL (ref 0.0–0.1)
Basophils Relative: 1 %
Eosinophils Absolute: 0.2 10*3/uL (ref 0.0–0.5)
Eosinophils Relative: 2 %
HCT: 35.9 % — ABNORMAL LOW (ref 36.0–46.0)
Hemoglobin: 11.7 g/dL — ABNORMAL LOW (ref 12.0–15.0)
Immature Granulocytes: 2 %
Lymphocytes Relative: 24 %
Lymphs Abs: 2.2 10*3/uL (ref 0.7–4.0)
MCH: 31.9 pg (ref 26.0–34.0)
MCHC: 32.6 g/dL (ref 30.0–36.0)
MCV: 97.8 fL (ref 80.0–100.0)
Monocytes Absolute: 1 10*3/uL (ref 0.1–1.0)
Monocytes Relative: 11 %
Neutro Abs: 5.8 10*3/uL (ref 1.7–7.7)
Neutrophils Relative %: 60 %
Platelet Count: 306 10*3/uL (ref 150–400)
RBC: 3.67 MIL/uL — ABNORMAL LOW (ref 3.87–5.11)
RDW: 14.4 % (ref 11.5–15.5)
WBC Count: 9.4 10*3/uL (ref 4.0–10.5)
nRBC: 0 % (ref 0.0–0.2)

## 2019-09-16 LAB — CMP (CANCER CENTER ONLY)
ALT: 16 U/L (ref 0–44)
AST: 17 U/L (ref 15–41)
Albumin: 2.9 g/dL — ABNORMAL LOW (ref 3.5–5.0)
Alkaline Phosphatase: 44 U/L (ref 38–126)
Anion gap: 5 (ref 5–15)
BUN: 10 mg/dL (ref 8–23)
CO2: 26 mmol/L (ref 22–32)
Calcium: 8.4 mg/dL — ABNORMAL LOW (ref 8.9–10.3)
Chloride: 101 mmol/L (ref 98–111)
Creatinine: 0.9 mg/dL (ref 0.44–1.00)
GFR, Est AFR Am: >60 mL/min (ref >60)
GFR, Estimated: >60 mL/min (ref >60)
Glucose, Bld: 134 mg/dL — ABNORMAL HIGH (ref 70–99)
Potassium: 4.1 mmol/L (ref 3.5–5.1)
Sodium: 132 mmol/L — ABNORMAL LOW (ref 135–145)
Total Bilirubin: 0.4 mg/dL (ref 0.3–1.2)
Total Protein: 10 g/dL — ABNORMAL HIGH (ref 6.5–8.1)

## 2019-09-16 MED FILL — NINLARO 4 MG CAPS: 4 | 28 days supply | Qty: 3 | Fill #0

## 2019-09-16 NOTE — Progress Notes (Signed)
Hematology and Oncology Follow Up Visit  Nicole Keith [id number] February 20, 1941 79 y.o. 09/16/2019 11:48 AM [name], [name], [name], MD   Principle Diagnosis: 79 year old woman with IgG lambda multiple myeloma diagnosed in 2019.  She was diagnosed with MGUS and subsequently at 30% involvement of the bone marrow with plasma cells.   Past therapy:    Velcade and dexamethasone started weekly on 07/27/2018.  Therapy changed in February 2020.  Revlimid 25 mg for 21 days out of a 28-day cycle with dexamethasone started in February 2020.  Therapy was held after 2 cycles because of severe dermatological toxicity.  Pomalyst 1 mg daily for 21 days started on May 1 of 2020.  She completed 4 cycles of therapy.  Current therapy: Ninlaro 4 mg weekly with dexamethasone 20 mg weekly for 3 weeks on and 1 week off started in December 2020.  Interim History:  Mrs. Alesi returns today for a follow-up.  Since the last visit, she reports no major changes in her health.  She continues to tolerate current therapy without any complaints.  She denies nausea, vomiting or abdominal pain.  She denies any worsening rash or lesions.  Appetite remains excellent.  She is ambulating better at this point since spending more time with walks on the park.  Denies any recent falls or syncope.           Medications: Without any changes on review. Current Outpatient Medications  Medication Sig Dispense Refill  . acyclovir (ZOVIRAX) 400 MG tablet Take 1 tablet (400 mg total) by mouth daily. 60 tablet 0  . dexamethasone (DECADRON) 4 MG tablet Take 5 tabs weekly. 60 40 tablet 3  . NINLARO 4 MG capsule TAKE 1 CAPSULE (4 MG) BY MOUTH WEEKLY, 3 WEEKS ON, 1 WEEK OFF, REPEAT EVERY 4 WEEKS. TAKE ON AN EMPTY STOMACH 1HR BEFORE OR 2HR AFTER MEALS 3 capsule 1  . Olopatadine HCl 0.2 % SOLN INSTILL 1 DROP INTO EACH EYE ONCE DAILY     No current facility-administered medications for this visit.     Allergies:  Allergies   Allergen Reactions  . Revlimid [Lenalidomide] Rash  . Betadine [Povidone Iodine] Rash  . Flagyl [Metronidazole] Diarrhea  . Fosamax [Alendronate Sodium] Other (See Comments)    GI distress  . Bactrim [Sulfamethoxazole-Trimethoprim]     nausea  . Doxepin Hcl     Irritable, hallucinations  . Hydroxyzine Hcl     hallucinations  . Valium [Diazepam] Other (See Comments)  . Actonel [Risedronate Sodium] Other (See Comments)    GI distress  . Boniva [Ibandronic Acid] Other (See Comments)    GI distress  . Doxycycline Rash  . Penicillins Rash    Ancef was OK.    Past Medical History, Surgical history, Social history, and Family History updated without any changes.  Physical Exam:     Blood pressure (!) 144/75, pulse 85, temperature 98.3 F (36.8 C), temperature source Temporal, resp. rate 18, height '5\' 6"'  (1.676 m), weight 141 lb 14.4 oz (64.4 kg), SpO2 97 %.       ECOG: 1   General appearance: Alert, awake without any distress. Head: Atraumatic without abnormalities Oropharynx: Without any thrush or ulcers. Eyes: No scleral icterus. Lymph nodes: No lymphadenopathy noted in the cervical, supraclavicular, or axillary nodes Heart:regular rate and rhythm, without any murmurs or gallops.   Lung: Clear to auscultation without any rhonchi, wheezes or dullness to percussion. Abdomin: Soft, nontender without any shifting dullness or ascites. Musculoskeletal: No clubbing or cyanosis.  Neurological: No motor or sensory deficits. Skin: No rashes or lesions.            Lab Results: Lab Results  Component Value Date   WBC 9.6 08/19/2019   HGB 12.2 08/19/2019   HCT 36.4 08/19/2019   MCV 95.5 08/19/2019   PLT 343 08/19/2019     Chemistry      Component Value Date/Time   NA 134 (L) 08/19/2019 1112   NA 135 (L) 06/11/2017 0753   K 3.8 08/19/2019 1112   K 4.0 06/11/2017 0753   CL 102 08/19/2019 1112   CO2 24 08/19/2019 1112   CO2 26 06/11/2017 0753   BUN 11  08/19/2019 1112   BUN 12.8 06/11/2017 0753   CREATININE 0.89 08/19/2019 1112   CREATININE 0.8 06/11/2017 0753      Component Value Date/Time   CALCIUM 8.3 (L) 08/19/2019 1112   CALCIUM 8.8 06/11/2017 0753   ALKPHOS 48 08/19/2019 1112   ALKPHOS 72 06/11/2017 0753   AST 16 08/19/2019 1112   AST 21 06/11/2017 0753   ALT 17 08/19/2019 1112   ALT 13 06/11/2017 0753   BILITOT 0.3 08/19/2019 1112   BILITOT 0.35 06/11/2017 0753       Results for Nicole, Keith (MRN [id number]) as of 09/16/2019 11:50  Ref. Range 07/20/2019 12:14 08/19/2019 11:12  M Protein SerPl Elph-Mcnc Latest Ref Range: Not Observed g/dL 3.4 (H) 3.7 (H)   Results for Nicole, Keith (MRN [id number]) as of 09/16/2019 11:50  Ref. Range 07/20/2019 12:14 08/19/2019 11:12  IgG (Immunoglobin G), Serum Latest Ref Range: 586 - 1,602 mg/dL 4,715 (H) 4,982 (H)     Impression and Plan:  79 year old woman with:  1.  IgG lambda multiple myeloma diagnosed in 2019.  She presented with MGUS and subsequently has increased plasma cell infiltration in the bone marrow.  He status post therapy outlined above and currently on Ninlaro 4 mg weekly with dexamethasone 20 mg weekly for 3 weeks on and 1 week off without complications.  Protein studies obtained after the first cycle of therapy showed an M spike that around 3.7 g/dL IgG level at 4900.  These values do indicate slight progression but overall stable disease.  Risks and benefits of continuing this treatment was discussed today.  He is agreeable to continue at this time.    2.  Anemia: Hemoglobin is adequate without any major changes.  3. Osteoporosis and compression fracture: No evidence of bone disease on the last skeletal survey.  She has been on Zometa in the past.  This was deferred based on her request.  4.  Herpes zoster reactivation: Currently on acyclovir without any reactivation.   5.  Rash: Continues to improve.  Appears to be related to medication and previous exposure  to Revlimid and Pomalyst.   6. Follow-up: In 4 weeks for repeat evaluation.  30  minutes was dedicated to this visit.  The time was spent on reviewing laboratory data, disease status update, treatment options and addressing complications related to therapy.   Zola Button, MD 2/5/202111:48 AM

## 2019-09-16 NOTE — Telephone Encounter (Signed)
Scheduled appt per 2/5 los.  Sent a message to HIM pool to get a calendar mailed out. 

## 2019-09-19 LAB — MULTIPLE MYELOMA PANEL, SERUM
Albumin SerPl Elph-Mcnc: 3.2 g/dL (ref 2.9–4.4)
Albumin/Glob SerPl: 0.6 — ABNORMAL LOW (ref 0.7–1.7)
Alpha 1: 0.2 g/dL (ref 0.0–0.4)
Alpha2 Glob SerPl Elph-Mcnc: 0.7 g/dL (ref 0.4–1.0)
B-Globulin SerPl Elph-Mcnc: 0.9 g/dL (ref 0.7–1.3)
Gamma Glob SerPl Elph-Mcnc: 4 g/dL — ABNORMAL HIGH (ref 0.4–1.8)
Globulin, Total: 5.8 g/dL — ABNORMAL HIGH (ref 2.2–3.9)
IgA: 37 mg/dL — ABNORMAL LOW (ref 64–422)
IgG (Immunoglobin G), Serum: 4833 mg/dL — ABNORMAL HIGH (ref 586–1602)
IgM (Immunoglobulin M), Srm: 35 mg/dL (ref 26–217)
M Protein SerPl Elph-Mcnc: 3.8 g/dL — ABNORMAL HIGH
Total Protein ELP: 9 g/dL — ABNORMAL HIGH (ref 6.0–8.5)

## 2019-09-19 LAB — KAPPA/LAMBDA LIGHT CHAINS
Kappa free light chain: 7.4 mg/L (ref 3.3–19.4)
Kappa, lambda light chain ratio: 0.04 — ABNORMAL LOW (ref 0.26–1.65)
Lambda free light chains: 193.5 mg/L — ABNORMAL HIGH (ref 5.7–26.3)

## 2019-09-20 ENCOUNTER — Other Ambulatory Visit: Payer: Self-pay

## 2019-09-20 MED ORDER — ACYCLOVIR 400 MG PO TABS: 400.0000 mg | ORAL_TABLET | Freq: Every day | ORAL | 0 refills | Status: DC

## 2019-09-20 MED FILL — ACYCLOVIR 400 MG TABLET: 400 | 60 days supply | Qty: 60 | Fill #0

## 2019-10-13 MED FILL — NINLARO 4 MG CAPS: 4 | 28 days supply | Qty: 3 | Fill #1

## 2019-10-21 ENCOUNTER — Inpatient Hospital Stay (HOSPITAL_BASED_OUTPATIENT_CLINIC_OR_DEPARTMENT_OTHER): Payer: Medicare Other | Admitting: Oncology

## 2019-10-21 ENCOUNTER — Other Ambulatory Visit: Payer: Self-pay

## 2019-10-21 ENCOUNTER — Inpatient Hospital Stay: Payer: Medicare Other | Attending: Oncology

## 2019-10-21 VITALS — BP 149/70 | HR 88 | Temp 99.1°F | Resp 20 | Ht 66.0 in | Wt 139.7 lb

## 2019-10-21 DIAGNOSIS — C9000 Multiple myeloma not having achieved remission: Secondary | ICD-10-CM | POA: Insufficient documentation

## 2019-10-21 DIAGNOSIS — D638 Anemia in other chronic diseases classified elsewhere: Secondary | ICD-10-CM | POA: Diagnosis not present

## 2019-10-21 DIAGNOSIS — B029 Zoster without complications: Secondary | ICD-10-CM | POA: Diagnosis not present

## 2019-10-21 LAB — CMP (CANCER CENTER ONLY)
ALT: 10 U/L (ref 0–44)
AST: 17 U/L (ref 15–41)
Albumin: 2.7 g/dL — ABNORMAL LOW (ref 3.5–5.0)
Alkaline Phosphatase: 43 U/L (ref 38–126)
Anion gap: 6 (ref 5–15)
BUN: 12 mg/dL (ref 8–23)
CO2: 26 mmol/L (ref 22–32)
Calcium: 8.3 mg/dL — ABNORMAL LOW (ref 8.9–10.3)
Chloride: 103 mmol/L (ref 98–111)
Creatinine: 0.81 mg/dL (ref 0.44–1.00)
GFR, Est AFR Am: >60 mL/min (ref >60)
GFR, Estimated: >60 mL/min (ref >60)
Glucose, Bld: 104 mg/dL — ABNORMAL HIGH (ref 70–99)
Potassium: 3.9 mmol/L (ref 3.5–5.1)
Sodium: 135 mmol/L (ref 135–145)
Total Bilirubin: 0.4 mg/dL (ref 0.3–1.2)
Total Protein: 10.2 g/dL — ABNORMAL HIGH (ref 6.5–8.1)

## 2019-10-21 LAB — CBC WITH DIFFERENTIAL (CANCER CENTER ONLY)
Abs Immature Granulocytes: 0.14 10*3/uL — ABNORMAL HIGH (ref 0.00–0.07)
Basophils Absolute: 0.1 10*3/uL (ref 0.0–0.1)
Basophils Relative: 1 %
Eosinophils Absolute: 0.2 10*3/uL (ref 0.0–0.5)
Eosinophils Relative: 2 %
HCT: 34.6 % — ABNORMAL LOW (ref 36.0–46.0)
Hemoglobin: 11.3 g/dL — ABNORMAL LOW (ref 12.0–15.0)
Immature Granulocytes: 2 %
Lymphocytes Relative: 24 %
Lymphs Abs: 2.2 10*3/uL (ref 0.7–4.0)
MCH: 31.9 pg (ref 26.0–34.0)
MCHC: 32.7 g/dL (ref 30.0–36.0)
MCV: 97.7 fL (ref 80.0–100.0)
Monocytes Absolute: 0.9 10*3/uL (ref 0.1–1.0)
Monocytes Relative: 10 %
Neutro Abs: 5.5 10*3/uL (ref 1.7–7.7)
Neutrophils Relative %: 61 %
Platelet Count: 312 10*3/uL (ref 150–400)
RBC: 3.54 MIL/uL — ABNORMAL LOW (ref 3.87–5.11)
RDW: 14.6 % (ref 11.5–15.5)
WBC Count: 8.9 10*3/uL (ref 4.0–10.5)
nRBC: 0 % (ref 0.0–0.2)

## 2019-10-21 NOTE — Progress Notes (Signed)
Hematology and Oncology Follow Up Visit  Nicole Keith [id number] 05/17/1941 79 y.o. 10/21/2019 10:59 AM Nicole Keith, [name], [name], MD   Principle Diagnosis: 79 year old woman with multiple myeloma diagnosed in 2019.  She was found to have IgG lambda  MGUS initially and subsequently developed  30% involvement of the bone marrow with plasma cells.   Past therapy:    Velcade and dexamethasone started weekly on 07/27/2018.  Therapy changed in February 2020.  Revlimid 25 mg for 21 days out of a 28-day cycle with dexamethasone started in February 2020.  Therapy was held after 2 cycles because of severe dermatological toxicity.  Pomalyst 1 mg daily for 21 days started on May 1 of 2020.  She completed 4 cycles of therapy.  Current therapy: Ninlaro 4 mg weekly with dexamethasone 20 mg weekly for 3 weeks on and 1 week off started in December 2020.  Interim History:  Nicole Keith is here for return evaluation.  Since the last visit, she continues to tolerate current therapy without any complaints.  She denies any nausea, vomiting or abdominal pain.  She denies any bone pain or pathological fractures.  She denies any recent hospitalization or illnesses.  She continues to enjoy reasonable quality of life and no weakness or tiredness.           Medications: Without any changes on review. Current Outpatient Medications  Medication Sig Dispense Refill  . acetaminophen (TYLENOL) 325 MG tablet Take by mouth.    [name] Kitchen acyclovir (ZOVIRAX) 400 MG tablet Take 1 tablet (400 mg total) by mouth daily. 60 tablet 0  . dexamethasone (DECADRON) 4 MG tablet Take 5 tabs weekly. 60 40 tablet 3  . diphenhydramine-acetaminophen (TYLENOL PM) 25-500 MG TABS tablet Take 2 tablets by mouth at bedtime as needed.    . famotidine-calcium carbonate-magnesium hydroxide (PEPCID COMPLETE) 10-800-165 MG chewable tablet Chew by mouth.    [name] Kitchen ibuprofen (ADVIL) 400 MG tablet Take 400 mg by mouth every 6 (six) hours as needed  (Joint pain).    [name] 4 MG capsule TAKE 1 CAPSULE (4 MG) BY MOUTH WEEKLY, 3 WEEKS ON, 1 WEEK OFF, REPEAT EVERY 4 WEEKS. TAKE ON AN EMPTY STOMACH 1HR BEFORE OR 2HR AFTER MEALS 3 capsule 1  . Olopatadine HCl 0.2 % SOLN INSTILL 1 DROP INTO EACH EYE ONCE DAILY    . Polyethyl Glycol-Propyl Glycol (SYSTANE) 0.4-0.3 % SOLN Place 1 drop into both eyes in the morning and at bedtime.     No current facility-administered medications for this visit.     Allergies:  Allergies  Allergen Reactions  . Revlimid [Lenalidomide] Rash  . Betadine [Povidone Iodine] Rash  . Flagyl [Metronidazole] Diarrhea  . Fosamax [Alendronate Sodium] Other (See Comments)    GI distress  . Bactrim [Sulfamethoxazole-Trimethoprim]     nausea  . Doxepin Hcl     Irritable, hallucinations  . Hydroxyzine Hcl     hallucinations  . Valium [Diazepam] Other (See Comments)  . Actonel [Risedronate Sodium] Other (See Comments)    GI distress  . Boniva [Ibandronic Acid] Other (See Comments)    GI distress  . Doxycycline Rash  . Penicillins Rash    Ancef was OK.    Past Medical History, Surgical history, Social history, and Family History updated without any changes.  Physical Exam:     Blood pressure (!) 149/70, pulse 88, temperature 99.1 F (37.3 C), temperature source Temporal, resp. rate 20, height '5\' 6"'  (1.676 m), weight 139 lb 11.2 oz (63.4  kg), SpO2 98 %.       ECOG: 1     General appearance: Comfortable appearing without any discomfort Head: Normocephalic without any trauma Oropharynx: Mucous membranes are moist and pink without any thrush or ulcers. Eyes: Pupils are equal and round reactive to light. Lymph nodes: No cervical, supraclavicular, inguinal or axillary lymphadenopathy.   Heart:regular rate and rhythm.  S1 and S2 without leg edema. Lung: Clear without any rhonchi or wheezes.  No dullness to percussion. Abdomin: Soft, nontender, nondistended with good bowel sounds.  No  hepatosplenomegaly. Musculoskeletal: No joint deformity or effusion.  Full range of motion noted. Neurological: No deficits noted on motor, sensory and deep tendon reflex exam. Skin: No petechial rash or dryness.  Appeared moist.               Lab Results: Lab Results  Component Value Date   WBC 8.9 10/21/2019   HGB 11.3 (L) 10/21/2019   HCT 34.6 (L) 10/21/2019   MCV 97.7 10/21/2019   PLT 312 10/21/2019     Chemistry      Component Value Date/Time   NA 132 (L) 09/16/2019 1148   NA 135 (L) 06/11/2017 0753   K 4.1 09/16/2019 1148   K 4.0 06/11/2017 0753   CL 101 09/16/2019 1148   CO2 26 09/16/2019 1148   CO2 26 06/11/2017 0753   BUN 10 09/16/2019 1148   BUN 12.8 06/11/2017 0753   CREATININE 0.90 09/16/2019 1148   CREATININE 0.8 06/11/2017 0753      Component Value Date/Time   CALCIUM 8.4 (L) 09/16/2019 1148   CALCIUM 8.8 06/11/2017 0753   ALKPHOS 44 09/16/2019 1148   ALKPHOS 72 06/11/2017 0753   AST 17 09/16/2019 1148   AST 21 06/11/2017 0753   ALT 16 09/16/2019 1148   ALT 13 06/11/2017 0753   BILITOT 0.4 09/16/2019 1148   BILITOT 0.35 06/11/2017 0753       Results for Nicole Keith, Nicole Keith (MRN [id number]) as of 10/21/2019 10:46  Ref. Range 08/19/2019 11:12 09/16/2019 11:48  M Protein SerPl Elph-Mcnc Latest Ref Range: Not Observed g/dL 3.7 (H) 3.8 (H)  IFE 1 Unknown Comment (A) Comment (A)  Globulin, Total Latest Ref Range: 2.2 - 3.9 g/dL 5.7 (H) 5.8 (H)  B-Globulin SerPl Elph-Mcnc Latest Ref Range: 0.7 - 1.3 g/dL 0.9 0.9  IgG (Immunoglobin G), Serum Latest Ref Range: 586 - 1,602 mg/dL 4,982 (H) 4,833 (H)  IgM (Immunoglobulin M), Srm Latest Ref Range: 26 - 217 mg/dL 39 35  IgA Latest Ref Range: 64 - 422 mg/dL 43 (L) 37 (L)      Impression and Plan:  79 year old woman with:  1.  Multiple myeloma diagnosed in 2019.  She was found to have IgG lambda arising from MGUS.  She continues to tolerate the current therapy without any major complications.  Protein  studies obtained in February were reviewed today and continues to show slight decline in her IgG level with M spike that is overall stable.  Risks and benefits of continuing this therapy was reviewed.  The role of additional therapy such as daratumumab was discussed at this time.  The plan is to restage her before the next visit with routine laboratory data as well as skeletal survey and will assess the need of therapy escalation.  For the time being we will continue the same dose and schedule.    2.  Anemia: Hemoglobin unchanged at this time we will continue to monitor.  Anemia is related to  plasma cell disorder.  3. Osteoporosis and compression fracture: She is status post Zometa in the past.  We will repeat skeletal survey for the next visit.  4.  Herpes zoster reactivation: No reactivation noted and remains on acyclovir.   5.  Rash: Related to Pomalyst and Revlimid.  Resolved at this time.   6. Follow-up: In 1 month for follow-up.  30  minutes were spent on this encounter.  Time was spent on reviewing her labs, imaging studies and discussing treatment options for the future.   Zola Button, MD 3/12/202110:59 AM

## 2019-10-24 ENCOUNTER — Telehealth: Payer: Self-pay | Admitting: Oncology

## 2019-10-24 LAB — KAPPA/LAMBDA LIGHT CHAINS
Kappa free light chain: 7 mg/L (ref 3.3–19.4)
Kappa, lambda light chain ratio: 0.03 — ABNORMAL LOW (ref 0.26–1.65)
Lambda free light chains: 221.1 mg/L — ABNORMAL HIGH (ref 5.7–26.3)

## 2019-10-24 NOTE — Telephone Encounter (Signed)
Scheduled appt per 3/12 los.  Sent a message to HIM pool to get a calendar mailed out. 

## 2019-10-25 LAB — MULTIPLE MYELOMA PANEL, SERUM
Albumin SerPl Elph-Mcnc: 3.3 g/dL (ref 2.9–4.4)
Albumin/Glob SerPl: 0.6 — ABNORMAL LOW (ref 0.7–1.7)
Alpha 1: 0.2 g/dL (ref 0.0–0.4)
Alpha2 Glob SerPl Elph-Mcnc: 0.7 g/dL (ref 0.4–1.0)
B-Globulin SerPl Elph-Mcnc: 0.8 g/dL (ref 0.7–1.3)
Gamma Glob SerPl Elph-Mcnc: 4.4 g/dL — ABNORMAL HIGH (ref 0.4–1.8)
Globulin, Total: 6.1 g/dL — ABNORMAL HIGH (ref 2.2–3.9)
IgA: 32 mg/dL — ABNORMAL LOW (ref 64–422)
IgG (Immunoglobin G), Serum: 5829 mg/dL — ABNORMAL HIGH (ref 586–1602)
IgM (Immunoglobulin M), Srm: 32 mg/dL (ref 26–217)
M Protein SerPl Elph-Mcnc: 4.2 g/dL — ABNORMAL HIGH
Total Protein ELP: 9.4 g/dL — ABNORMAL HIGH (ref 6.0–8.5)

## 2019-10-26 ENCOUNTER — Telehealth: Payer: Self-pay

## 2019-10-26 NOTE — Telephone Encounter (Signed)
Received phone call from patient stating she had fallen on Monday (10/24/19) on her right side. Patient denies loss of consciousness before or after fall.  Patient states she "heard something pop" in her shoulder/collarbone area and is unable to move her shoulder or arm. Dr. Alen Blew made aware. Patient advise to go to Urgent Care or ED to be evaluated. Patient verbalized understanding.

## 2019-10-31 ENCOUNTER — Telehealth: Payer: Self-pay

## 2019-10-31 NOTE — Telephone Encounter (Signed)
Called patient and made her aware that Dr. Alen Blew has been given an update about her condition. Patient verbalized understanding.

## 2019-10-31 NOTE — Telephone Encounter (Signed)
-----   Message from Wyatt Portela, MD sent at 10/31/2019  9:33 AM EDT ----- Noted. Thanks ----- Message ----- From: Tami Lin, RN Sent: 10/31/2019   9:06 AM EDT To: Wyatt Portela, MD  Patient called and wanted to make you aware that she went Urgent Care on 3/17 after reporting a fall. She was sent from Urgent Care to [facility]. She wants to let you know she has a cracked collarbone and is now in a brace. Thanks, Lanelle Bal

## 2019-11-07 ENCOUNTER — Other Ambulatory Visit: Payer: Self-pay | Admitting: Oncology

## 2019-11-09 MED FILL — NINLARO 4 MG CAPS: 4 | 28 days supply | Qty: 3 | Fill #0

## 2019-11-15 ENCOUNTER — Ambulatory Visit (HOSPITAL_COMMUNITY)
Admission: RE | Admit: 2019-11-15 | Discharge: 2019-11-15 | Disposition: A | Discharge status: Home / Self Care | Payer: Medicare Other | Source: Ambulatory Visit | Attending: Oncology | Admitting: Oncology

## 2019-11-15 ENCOUNTER — Ambulatory Visit (HOSPITAL_COMMUNITY): Payer: Medicare Other

## 2019-11-15 ENCOUNTER — Other Ambulatory Visit: Payer: Self-pay

## 2019-11-15 ENCOUNTER — Inpatient Hospital Stay: Payer: Medicare Other | Attending: Oncology

## 2019-11-15 DIAGNOSIS — S42032A Displaced fracture of lateral end of left clavicle, initial encounter for closed fracture: Secondary | ICD-10-CM | POA: Diagnosis not present

## 2019-11-15 DIAGNOSIS — W19XXXA Unspecified fall, initial encounter: Secondary | ICD-10-CM | POA: Insufficient documentation

## 2019-11-15 DIAGNOSIS — B029 Zoster without complications: Secondary | ICD-10-CM | POA: Diagnosis not present

## 2019-11-15 DIAGNOSIS — Z79899 Other long term (current) drug therapy: Secondary | ICD-10-CM | POA: Diagnosis not present

## 2019-11-15 DIAGNOSIS — C9000 Multiple myeloma not having achieved remission: Secondary | ICD-10-CM | POA: Diagnosis not present

## 2019-11-15 DIAGNOSIS — D649 Anemia, unspecified: Secondary | ICD-10-CM | POA: Insufficient documentation

## 2019-11-15 DIAGNOSIS — Z5112 Encounter for antineoplastic immunotherapy: Secondary | ICD-10-CM | POA: Insufficient documentation

## 2019-11-15 LAB — CBC WITH DIFFERENTIAL (CANCER CENTER ONLY)
Abs Immature Granulocytes: 0.08 10*3/uL — ABNORMAL HIGH (ref 0.00–0.07)
Basophils Absolute: 0 10*3/uL (ref 0.0–0.1)
Basophils Relative: 0 %
Eosinophils Absolute: 0 10*3/uL (ref 0.0–0.5)
Eosinophils Relative: 0 %
HCT: 31.5 % — ABNORMAL LOW (ref 36.0–46.0)
Hemoglobin: 10.3 g/dL — ABNORMAL LOW (ref 12.0–15.0)
Immature Granulocytes: 1 %
Lymphocytes Relative: 14 %
Lymphs Abs: 1.8 10*3/uL (ref 0.7–4.0)
MCH: 32 pg (ref 26.0–34.0)
MCHC: 32.7 g/dL (ref 30.0–36.0)
MCV: 97.8 fL (ref 80.0–100.0)
Monocytes Absolute: 1.2 10*3/uL — ABNORMAL HIGH (ref 0.1–1.0)
Monocytes Relative: 9 %
Neutro Abs: 9.6 10*3/uL — ABNORMAL HIGH (ref 1.7–7.7)
Neutrophils Relative %: 76 %
Platelet Count: 365 10*3/uL (ref 150–400)
RBC: 3.22 MIL/uL — ABNORMAL LOW (ref 3.87–5.11)
RDW: 14.5 % (ref 11.5–15.5)
WBC Count: 12.7 10*3/uL — ABNORMAL HIGH (ref 4.0–10.5)
nRBC: 0 % (ref 0.0–0.2)

## 2019-11-15 LAB — CMP (CANCER CENTER ONLY)
ALT: 13 U/L (ref 0–44)
AST: 17 U/L (ref 15–41)
Albumin: 2.9 g/dL — ABNORMAL LOW (ref 3.5–5.0)
Alkaline Phosphatase: 46 U/L (ref 38–126)
Anion gap: 8 (ref 5–15)
BUN: 14 mg/dL (ref 8–23)
CO2: 24 mmol/L (ref 22–32)
Calcium: 8.7 mg/dL — ABNORMAL LOW (ref 8.9–10.3)
Chloride: 100 mmol/L (ref 98–111)
Creatinine: 0.81 mg/dL (ref 0.44–1.00)
GFR, Est AFR Am: >60 mL/min (ref >60)
GFR, Estimated: >60 mL/min (ref >60)
Glucose, Bld: 103 mg/dL — ABNORMAL HIGH (ref 70–99)
Potassium: 3.5 mmol/L (ref 3.5–5.1)
Sodium: 132 mmol/L — ABNORMAL LOW (ref 135–145)
Total Bilirubin: 0.4 mg/dL (ref 0.3–1.2)
Total Protein: 11.3 g/dL — ABNORMAL HIGH (ref 6.5–8.1)

## 2019-11-16 LAB — KAPPA/LAMBDA LIGHT CHAINS
Kappa free light chain: 7.1 mg/L (ref 3.3–19.4)
Kappa, lambda light chain ratio: 0.04 — ABNORMAL LOW (ref 0.26–1.65)
Lambda free light chains: 159.3 mg/L — ABNORMAL HIGH (ref 5.7–26.3)

## 2019-11-18 LAB — MULTIPLE MYELOMA PANEL, SERUM
Albumin SerPl Elph-Mcnc: 3.6 g/dL (ref 2.9–4.4)
Albumin/Glob SerPl: 0.6 — ABNORMAL LOW (ref 0.7–1.7)
Alpha 1: 0.3 g/dL (ref 0.0–0.4)
Alpha2 Glob SerPl Elph-Mcnc: 0.8 g/dL (ref 0.4–1.0)
B-Globulin SerPl Elph-Mcnc: 1 g/dL (ref 0.7–1.3)
Gamma Glob SerPl Elph-Mcnc: 4.7 g/dL — ABNORMAL HIGH (ref 0.4–1.8)
Globulin, Total: 6.7 g/dL — ABNORMAL HIGH (ref 2.2–3.9)
IgA: 29 mg/dL — ABNORMAL LOW (ref 64–422)
IgG (Immunoglobin G), Serum: 6029 mg/dL — ABNORMAL HIGH (ref 586–1602)
IgM (Immunoglobulin M), Srm: 28 mg/dL (ref 26–217)
M Protein SerPl Elph-Mcnc: 4.6 g/dL — ABNORMAL HIGH
Total Protein ELP: 10.3 g/dL — ABNORMAL HIGH (ref 6.0–8.5)

## 2019-11-21 ENCOUNTER — Telehealth: Payer: Self-pay | Admitting: Oncology

## 2019-11-21 ENCOUNTER — Other Ambulatory Visit: Payer: Self-pay

## 2019-11-21 ENCOUNTER — Inpatient Hospital Stay: Payer: Medicare Other | Admitting: Oncology

## 2019-11-21 VITALS — BP 141/67 | HR 87 | Temp 99.6°F | Resp 18 | Ht 66.0 in | Wt 140.4 lb

## 2019-11-21 DIAGNOSIS — D472 Monoclonal gammopathy: Secondary | ICD-10-CM

## 2019-11-21 DIAGNOSIS — C9000 Multiple myeloma not having achieved remission: Secondary | ICD-10-CM

## 2019-11-21 DIAGNOSIS — Z5112 Encounter for antineoplastic immunotherapy: Secondary | ICD-10-CM | POA: Diagnosis not present

## 2019-11-21 MED ORDER — ACYCLOVIR 400 MG PO TABS: 400.0000 mg | ORAL_TABLET | Freq: Every day | ORAL | 0 refills | Status: DC

## 2019-11-21 MED ORDER — DEXAMETHASONE 4 MG PO TABS: ORAL_TABLET | ORAL | 3 refills | Status: DC

## 2019-11-21 MED FILL — ACYCLOVIR 400 MG TABLET: 400 | 60 days supply | Qty: 60 | Fill #0

## 2019-11-21 NOTE — Telephone Encounter (Signed)
Scheduled appt per 4/12 los.  Spoke with pt and she is aware of the appt date and time

## 2019-11-21 NOTE — Progress Notes (Signed)
Hematology and Oncology Follow Up Visit  Nicole Keith [id number] [birth year] 79 y.o. 11/21/2019 9:47 AM [name], [name], [name], MD   Principle Diagnosis: 79 year old woman with IgG lambda multiple myeloma arising from MGUS diagnosed in 2019.  She has 30% involvement in the bone marrow and worsening anemia.   Past therapy:    Velcade and dexamethasone started weekly on 07/27/2018.  Therapy changed in February 2020.  Revlimid 25 mg for 21 days out of a 28-day cycle with dexamethasone started in February 2020.  Therapy was held after 2 cycles because of severe dermatological toxicity.  Pomalyst 1 mg daily for 21 days started on May 1 of 2020.  She completed 4 cycles of therapy.  Current therapy: Ninlaro 4 mg weekly with dexamethasone 20 mg weekly for 3 weeks on and 1 week off started in December 2020.  Interim History:  Mrs. Nicole Keith returns today for a follow-up.  Since the last visit, she reports feeling reasonably well without any major complaints.  She did sustain a fall and resulted in a fracture of her left clavicle despite falling on the right side.  Otherwise she does not report any major complications related to her current therapy.  She does report some fatigue and tiredness on the day of Ninlaro and dexamethasone treatment.  She denies any nausea vomiting or abdominal pain.  Her performance status and quality of life remains unchanged.           Medications: Unchanged on review. Current Outpatient Medications  Medication Sig Dispense Refill  . acetaminophen (TYLENOL) 325 MG tablet Take by mouth.    [name] Kitchen acyclovir (ZOVIRAX) 400 MG tablet Take 1 tablet (400 mg total) by mouth daily. 60 tablet 0  . dexamethasone (DECADRON) 4 MG tablet Take 5 tabs weekly. 60 40 tablet 3  . diphenhydramine-acetaminophen (TYLENOL PM) 25-500 MG TABS tablet Take 2 tablets by mouth at bedtime as needed.    . famotidine-calcium carbonate-magnesium hydroxide (PEPCID COMPLETE) 10-800-165 MG  chewable tablet Chew by mouth.    [name] Kitchen ibuprofen (ADVIL) 400 MG tablet Take 400 mg by mouth every 6 (six) hours as needed (Joint pain).    [name] 4 MG capsule TAKE 1 CAPSULE (4 MG) BY MOUTH WEEKLY, 3 WEEKS ON, 1 WEEK OFF, REPEAT EVERY 4 WEEKS. TAKE ON AN EMPTY STOMACH 1HR BEFORE OR 2HR AFTER MEALS 3 capsule 1  . Olopatadine HCl 0.2 % SOLN INSTILL 1 DROP INTO EACH EYE ONCE DAILY    . Polyethyl Glycol-Propyl Glycol (SYSTANE) 0.4-0.3 % SOLN Place 1 drop into both eyes in the morning and at bedtime.     No current facility-administered medications for this visit.     Allergies:  Allergies  Allergen Reactions  . Revlimid [Lenalidomide] Rash  . Betadine [Povidone Iodine] Rash  . Flagyl [Metronidazole] Diarrhea  . Fosamax [Alendronate Sodium] Other (See Comments)    GI distress  . Bactrim [Sulfamethoxazole-Trimethoprim]     nausea  . Doxepin Hcl     Irritable, hallucinations  . Hydroxyzine Hcl     hallucinations  . Valium [Diazepam] Other (See Comments)  . Actonel [Risedronate Sodium] Other (See Comments)    GI distress  . Boniva [Ibandronic Acid] Other (See Comments)    GI distress  . Doxycycline Rash  . Penicillins Rash    Ancef was OK.      Physical Exam:       Blood pressure (!) 141/67, pulse 87, temperature 99.6 F (37.6 C), temperature source Temporal, resp. rate 18, height  '5\' 6"'  (1.676 m), weight 140 lb 6.4 oz (63.7 kg), SpO2 97 %.      ECOG: 1   General appearance: Alert, awake without any distress. Head: Atraumatic without abnormalities Oropharynx: Without any thrush or ulcers. Eyes: No scleral icterus. Lymph nodes: No lymphadenopathy noted in the cervical, supraclavicular, or axillary nodes Heart:regular rate and rhythm, without any murmurs or gallops.   Lung: Clear to auscultation without any rhonchi, wheezes or dullness to percussion. Abdomin: Soft, nontender without any shifting dullness or ascites. Musculoskeletal: No clubbing or  cyanosis. Neurological: No motor or sensory deficits. Skin: No rashes or lesions.               Lab Results: Lab Results  Component Value Date   WBC 12.7 (H) 11/15/2019   HGB 10.3 (L) 11/15/2019   HCT 31.5 (L) 11/15/2019   MCV 97.8 11/15/2019   PLT 365 11/15/2019     Chemistry      Component Value Date/Time   NA 132 (L) 11/15/2019 1036   NA 135 (L) 06/11/2017 0753   K 3.5 11/15/2019 1036   K 4.0 06/11/2017 0753   CL 100 11/15/2019 1036   CO2 24 11/15/2019 1036   CO2 26 06/11/2017 0753   BUN 14 11/15/2019 1036   BUN 12.8 06/11/2017 0753   CREATININE 0.81 11/15/2019 1036   CREATININE 0.8 06/11/2017 0753      Component Value Date/Time   CALCIUM 8.7 (L) 11/15/2019 1036   CALCIUM 8.8 06/11/2017 0753   ALKPHOS 46 11/15/2019 1036   ALKPHOS 72 06/11/2017 0753   AST 17 11/15/2019 1036   AST 21 06/11/2017 0753   ALT 13 11/15/2019 1036   ALT 13 06/11/2017 0753   BILITOT 0.4 11/15/2019 1036   BILITOT 0.35 06/11/2017 0753       Results for KADEISHA, BETSCH (MRN [id number]) as of 11/21/2019 09:48  Ref. Range 10/21/2019 10:16 11/15/2019 10:37  M Protein SerPl Elph-Mcnc Latest Ref Range: Not Observed g/dL 4.2 (H) 4.6 (H)    Results for AUDRIELLA, BLAKELEY (MRN [id number]) as of 11/21/2019 09:48  Ref. Range 10/21/2019 10:16 11/15/2019 10:37  IgG (Immunoglobin G), Serum Latest Ref Range: 586 - 1,602 mg/dL 5,829 (H) 6,029 (H)   Results for AYSIAH, JURADO (MRN [id number]) as of 11/21/2019 09:48  Ref. Range 11/15/2019 10:36  Kappa free light chain Latest Ref Range: 3.3 - 19.4 mg/L 7.1  Lamda free light chains Latest Ref Range: 5.7 - 26.3 mg/L 159.3 (H)  Kappa, lamda light chain ratio Latest Ref Range: 0.26 - 1.65  0.04 (L)   IMPRESSION: 1. New mildly displaced and angulated fracture of the mid left clavicle. This has a subacute appearance and could be pathologic. 2. Progressive lytic lesions throughout the calvarium, suspicious for progressive multiple myeloma. 3. Minimally  progressive T8 compression fracture from 2018. No new spinal fractures.  Impression and Plan:  79 year old woman with:  1.  IgG lambda multiple myeloma diagnosed in 2019.    She is currently on Ninlaro without any major complications.  Protein studies as well as skeletal survey obtained on November 15, 2019 were reviewed today and discussed with the patient.  She has clear progression of disease with M spike currently at 4.6 g/dL despite multiple cycles of therapy.  He does have potential clear progression on her skeletal survey although this could reflect benign findings.    Treatment options at this time were reviewed and I recommended adding daratumumab in addition to her current therapy to escalate  her treatment as well as prevent any potential complications of her disease progression.  Complication associated with this treatment including infusion related issues such as arthralgias, myalgias and rarely anaphylaxis.  We will choose the subcutaneous formulation for better tolerance and avoiding IV access.  She is agreeable to proceed with this treatment.    2.  Anemia: Hemoglobin slightly declined but does not require any intervention at this time.  3. Osteoporosis and compression fracture: I am in favor of restarting Zometa at this time given her potential bone disease and osteoporosis.  This will be used in subsequent treatments after she has received daratumumab.  4.  Herpes zoster reactivation: I recommended continuing acyclovir for the time being.  No reactivation noted.   5.  Clavicular fracture: It is unclear if it is a pathological versus traumatic.   6. Follow-up: She will receive treatment weekly starting in the immediate future and have MD follow-up in 4 weeks.  30  minutes were dedicated to this visit.  Time was spent on reviewing laboratory data, imaging studies discussing treatment options and complications related to therapy.  Zola Button, MD 4/12/20219:47 AM

## 2019-11-21 NOTE — Progress Notes (Signed)
DISCONTINUE ON PATHWAY REGIMEN - Multiple Myeloma and Other Plasma Cell Dyscrasias     A cycle is every 21 days:     Bortezomib      Lenalidomide      Dexamethasone   **Always confirm dose/schedule in your pharmacy ordering system**  REASON: Disease Progression PRIOR TREATMENT: [id number]: VRd (Bortezomib 1.3 mg/m2 Subcut D1, 4, 8, 11 + Lenalidomide 25 mg + Dexamethasone 20 mg) q21 Days x 8 Cycles TREATMENT RESPONSE: Progressive Disease (PD)  START ON PATHWAY REGIMEN - Multiple Myeloma and Other Plasma Cell Dyscrasias     Cycles 1 and 2: A cycle is every 28 days:     Daratumumab    Cycles 3 through 6: A cycle is every 28 days:     Daratumumab    Cycles 7 and beyond: A cycle is every 28 days:     Daratumumab   **Always confirm dose/schedule in your pharmacy ordering system**  Patient Characteristics: Multiple Myeloma, Relapsed / Refractory, Second through Fourth Lines of Therapy Disease Classification: Multiple Myeloma R-ISS Staging: III Therapeutic Status: Relapsed Line of Therapy: Third Line Intent of Therapy: Non-Curative / Palliative Intent, Discussed with Patient

## 2019-11-22 ENCOUNTER — Encounter (HOSPITAL_COMMUNITY): Payer: Self-pay | Admitting: Emergency Medicine

## 2019-11-22 ENCOUNTER — Emergency Department (HOSPITAL_COMMUNITY): Payer: Medicare Other

## 2019-11-22 ENCOUNTER — Other Ambulatory Visit: Payer: Self-pay

## 2019-11-22 ENCOUNTER — Emergency Department (HOSPITAL_COMMUNITY)
Admission: EM | Admit: 2019-11-22 | Discharge: 2019-11-22 | Disposition: A | Discharge status: Home / Self Care | Payer: Medicare Other | Attending: Emergency Medicine | Admitting: Emergency Medicine

## 2019-11-22 DIAGNOSIS — Y92019 Unspecified place in single-family (private) house as the place of occurrence of the external cause: Secondary | ICD-10-CM | POA: Insufficient documentation

## 2019-11-22 DIAGNOSIS — Y999 Unspecified external cause status: Secondary | ICD-10-CM | POA: Insufficient documentation

## 2019-11-22 DIAGNOSIS — W010XXA Fall on same level from slipping, tripping and stumbling without subsequent striking against object, initial encounter: Secondary | ICD-10-CM

## 2019-11-22 DIAGNOSIS — W1830XA Fall on same level, unspecified, initial encounter: Secondary | ICD-10-CM | POA: Diagnosis not present

## 2019-11-22 DIAGNOSIS — Z23 Encounter for immunization: Secondary | ICD-10-CM | POA: Diagnosis not present

## 2019-11-22 DIAGNOSIS — T07XXXA Unspecified multiple injuries, initial encounter: Secondary | ICD-10-CM | POA: Diagnosis present

## 2019-11-22 DIAGNOSIS — S022XXB Fracture of nasal bones, initial encounter for open fracture: Secondary | ICD-10-CM

## 2019-11-22 DIAGNOSIS — Z87891 Personal history of nicotine dependence: Secondary | ICD-10-CM | POA: Diagnosis not present

## 2019-11-22 DIAGNOSIS — S0083XA Contusion of other part of head, initial encounter: Secondary | ICD-10-CM | POA: Diagnosis not present

## 2019-11-22 DIAGNOSIS — Y939 Activity, unspecified: Secondary | ICD-10-CM | POA: Diagnosis not present

## 2019-11-22 DIAGNOSIS — Z8579 Personal history of other malignant neoplasms of lymphoid, hematopoietic and related tissues: Secondary | ICD-10-CM

## 2019-11-22 DIAGNOSIS — R040 Epistaxis: Secondary | ICD-10-CM | POA: Insufficient documentation

## 2019-11-22 MED ORDER — ACETAMINOPHEN 325 MG PO TABS
650.0000 mg | ORAL_TABLET | Freq: Once | ORAL | Status: AC
  Administered 2019-11-22: 21:00:00 650 mg via ORAL
  Filled 2019-11-22: qty 2

## 2019-11-22 MED ORDER — TETANUS-DIPHTH-ACELL PERTUSSIS 5-2.5-18.5 LF-MCG/0.5 IM SUSP
0.5000 mL | Freq: Once | INTRAMUSCULAR | Status: AC
  Administered 2019-11-22: 0.5 mL via INTRAMUSCULAR
  Filled 2019-11-22: qty 0.5

## 2019-11-22 MED ORDER — CEPHALEXIN 250 MG PO CAPS
500.0000 mg | ORAL_CAPSULE | Freq: Once | ORAL | Status: AC
  Administered 2019-11-22: 500 mg via ORAL
  Filled 2019-11-22 (×2): qty 2

## 2019-11-22 MED ORDER — CEPHALEXIN 500 MG PO CAPS: 500.0000 mg | ORAL_CAPSULE | Freq: Three times a day (TID) | ORAL | 0 refills | Status: DC

## 2019-11-22 MED ORDER — LIDOCAINE-EPINEPHRINE (PF) 2 %-1:200000 IJ SOLN
20.0000 mL | Freq: Once | INTRAMUSCULAR | Status: AC
  Administered 2019-11-22: 20 mL
  Filled 2019-11-22: qty 20

## 2019-11-22 MED ORDER — BACITRACIN ZINC 500 UNIT/GM EX OINT
TOPICAL_OINTMENT | Freq: Two times a day (BID) | CUTANEOUS | Status: DC
  Filled 2019-11-22: qty 0.9

## 2019-11-22 NOTE — ED Triage Notes (Signed)
Pt here from home for mechanical fall via [facility]. Pt tripped walking into her front door, landed face first hitting nose, denies LOC, head/neck pain. Pt had nosebleed, now controlled, but has bleeding to lac on bridge of nose. Pt denies taking blood thinners. AOx4, VSS.

## 2019-11-22 NOTE — Discharge Instructions (Addendum)
It was our pleasure to provide your ER care today - we hope that you feel better.  Fall precautions.   Take antibiotic (keflex) as prescribed.   Cold/icepack to sore area. Elevate head. Keep wound very clean. Have sutures removed in 1 week.   For nasal fractures, follow up with ENT (ear/nose/throat) doctor in the coming week - call office tomorrow to arrange appointment.   Take acetaminophen as need for pain.  Return to ER if worse, new symptoms, new or severe pain, severe headache, infection of wound, or other concern.

## 2019-11-22 NOTE — ED Provider Notes (Signed)
[facility] EMERGENCY DEPARTMENT Provider Note   CSN: [id number] Arrival date & time: 11/22/19  1951     History Chief Complaint  Patient presents with  . Fall    Nicole Keith is a 79 y.o. female.  Patient presents s/p fall by EMS. Pt indicates she tripped over threshold of door, fell forward, but couldn't get arms/hands up in time and hit face/head/nose on floor. No loc. +dazed. Dull frontal headache post fall. Contusion and laceration to nose. +noselbeeding. No neck or back pain. No change in vision. No numbness/weakness. Denies other pain or injury. No cp or sob. No abd pain or nv. No extremity pain or injury. Last tetanus unknown. No anticoag use.   The history is provided by the patient.  Fall Pertinent negatives include no chest pain, no abdominal pain and no shortness of breath.       Past Medical History:  Diagnosis Date  . Allergic rhinitis   . Arthritis   . Colitis, collagenous   . Diverticular disease    severe sig tics/fixation 2012  . Family hx of colon cancer   . Fracture of femoral neck, right ([place]) 01/09/2013  . GERD (gastroesophageal reflux disease)    exacerbation of reflux-like symptoms 04/2011  . Hiatal hernia   . Hyperlipidemia   . Osteoarthritis of right hip 01/10/2013    Patient Active Problem List   Diagnosis Date Noted  . Goals of care, counseling/discussion 07/20/2018  . Multiple myeloma ([employer]) 07/20/2018  . Osteoporosis 09/15/2016  . Plasma cell disorder   . Redness of skin 02/06/2013  . Rash and other nonspecific skin eruption 02/06/2013  . Acute posthemorrhagic anemia 02/03/2013  . Unspecified constipation 02/03/2013  . Allergic rhinitis, cause unspecified 02/03/2013  . Osteoarthritis of right hip 01/10/2013  . Fracture of femoral neck, right ([place]) 01/09/2013    Past Surgical History:  Procedure Laterality Date  . CHOLECYSTECTOMY    . EYE SURGERY  feb 2016   cataract right eye  . LEG SURGERY Left   .  TONSILLECTOMY    . TOTAL HIP ARTHROPLASTY Right 01/10/2013   Procedure: TOTAL HIP ARTHROPLASTY;  Surgeon: Johnny Bridge, MD;  Location: [place];  Service: Orthopedics;  Laterality: Right;     OB History   No obstetric history on file.     Family History  Problem Relation Age of Onset  . Congestive Heart Failure Mother   . Arthritis-Osteo Mother   . Colon cancer Mother   . Heart attack Father   . Rheum arthritis Father   . Colon cancer Maternal Aunt   . Colon cancer Maternal Aunt   . Hypothyroidism Sister     Social History   Tobacco Use  . Smoking status: Former Smoker    Quit date: 01/10/1972    Years since quitting: 47.8  . Smokeless tobacco: Never Used  . Tobacco comment: quit 1973  Substance Use Topics  . Alcohol use: No  . Drug use: No    Home Medications Prior to Admission medications   Medication Sig Start Date End Date Taking? Authorizing Provider  acetaminophen (TYLENOL) 325 MG tablet Take by mouth.    [provider]  acyclovir (ZOVIRAX) 400 MG tablet Take 1 tablet (400 mg total) by mouth daily. 11/21/19   Wyatt Portela, MD  dexamethasone (DECADRON) 4 MG tablet Take 5 tabs weekly. 60 11/21/19   Wyatt Portela, MD  diphenhydramine-acetaminophen (TYLENOL PM) 25-500 MG TABS tablet Take 2 tablets by mouth  at bedtime as needed.    [provider]  famotidine-calcium carbonate-magnesium hydroxide (PEPCID COMPLETE) 10-800-165 MG chewable tablet Chew by mouth.    [provider]  ibuprofen (ADVIL) 400 MG tablet Take 400 mg by mouth every 6 (six) hours as needed (Joint pain).    [provider]  NINLARO 4 MG capsule TAKE 1 CAPSULE (4 MG) BY MOUTH WEEKLY, 3 WEEKS ON, 1 WEEK OFF, REPEAT EVERY 4 WEEKS. TAKE ON AN EMPTY STOMACH 1HR BEFORE OR 2HR AFTER MEALS 11/07/19   Wyatt Portela, MD  Olopatadine HCl 0.2 % SOLN INSTILL 1 DROP INTO EACH EYE ONCE DAILY 09/28/18   [provider]  Polyethyl Glycol-Propyl Glycol (SYSTANE) 0.4-0.3 %  SOLN Place 1 drop into both eyes in the morning and at bedtime.    [provider]    Allergies    Revlimid [lenalidomide], Betadine [povidone iodine], Flagyl [metronidazole], Fosamax [alendronate sodium], Bactrim [sulfamethoxazole-trimethoprim], Doxepin hcl, Hydroxyzine hcl, Valium [diazepam], Actonel [risedronate sodium], Boniva [ibandronic acid], Doxycycline, and Penicillins  Review of Systems   Review of Systems  Constitutional: Negative for fever.  HENT: Positive for nosebleeds.   Eyes: Negative for pain and visual disturbance.  Respiratory: Negative for shortness of breath.   Cardiovascular: Negative for chest pain.  Gastrointestinal: Negative for abdominal pain, nausea and vomiting.  Genitourinary: Negative for flank pain.  Musculoskeletal: Negative for back pain and neck pain.  Skin: Positive for wound.  Neurological: Negative for weakness and numbness.  Hematological: Does not bruise/bleed easily.  Psychiatric/Behavioral: Negative for confusion.    Physical Exam Updated Vital Signs Temp 98.2 F (36.8 C) (Oral)   Ht 1.676 m ('5\' 6"' )   Wt 63.7 kg   SpO2 98%   BMI 22.66 kg/m   Physical Exam Vitals and nursing note reviewed.  Constitutional:      Appearance: Normal appearance. She is well-developed.  HENT:     Head:     Comments: Contusion to forehead. Contusion with moderate sts and 2 cm laceration to bridge of nose. Dried blood bil nares, no nasal septal hematoma.     Nose:     Comments: Dried blood bil nares. No septal hematoma.     Mouth/Throat:     Mouth: Mucous membranes are moist.     Comments: Teeth intact. No malocclusion Eyes:     General: No scleral icterus.    Extraocular Movements: Extraocular movements intact.     Conjunctiva/sclera: Conjunctivae normal.     Pupils: Pupils are equal, round, and reactive to light.  Neck:     Trachea: No tracheal deviation.  Cardiovascular:     Rate and Rhythm: Normal rate and regular rhythm.     Pulses:  Normal pulses.     Heart sounds: Normal heart sounds. No murmur. No friction rub. No gallop.   Pulmonary:     Effort: Pulmonary effort is normal. No respiratory distress.     Breath sounds: Normal breath sounds.  Chest:     Chest wall: No tenderness.  Abdominal:     General: Bowel sounds are normal. There is no distension.     Palpations: Abdomen is soft.     Tenderness: There is no abdominal tenderness. There is no guarding.     Comments: No abd contusion, pain or tenderness.   Genitourinary:    Comments: No cva tenderness.  Musculoskeletal:        General: No swelling.     Cervical back: Normal range of motion and neck supple. No  rigidity or tenderness. No muscular tenderness.     Comments: CTLS spine, non tender, aligned, no step off. Good rom bil extremities without pain or focal bony tenderness.   Skin:    General: Skin is warm and dry.     Findings: No rash.  Neurological:     Mental Status: She is alert.     Comments: Alert, speech normal. GCAS 15. Motor/sens grossly intact bil.   Psychiatric:        Mood and Affect: Mood normal.     ED Results / Procedures / Treatments   Labs (all labs ordered are listed, but only abnormal results are displayed) Labs Reviewed - No data to display  EKG None  Radiology CT HEAD WO CONTRAST  Result Date: 11/22/2019 CLINICAL DATA:  Fall with facial trauma EXAM: CT HEAD WITHOUT CONTRAST CT MAXILLOFACIAL WITHOUT CONTRAST TECHNIQUE: Multidetector CT imaging of the head and maxillofacial structures were performed using the standard protocol without intravenous contrast. Multiplanar CT image reconstructions of the maxillofacial structures were also generated. COMPARISON:  None. FINDINGS: CT HEAD FINDINGS Brain: There is no mass, hemorrhage or extra-axial collection. The size and configuration of the ventricles and extra-axial CSF spaces are normal. The brain parenchyma is normal, without evidence of acute or chronic infarction. Vascular: No  hyperdense vessel or unexpected vascular calcification. Skull: No skull fracture. There are numerous lucent lesions throughout the calvarium. The largest is at the left frontal calvarium and extends nearly the full thickness of the skull. CT MAXILLOFACIAL FINDINGS Osseous: --Complex facial fracture types: No LeFort, zygomaticomaxillary complex or nasoorbitoethmoidal fracture. --Simple fracture types: Comminuted fractures of the nasal bones and anterior nasal septum. --Mandible, hard palate and teeth: No acute abnormality. Orbits: The globes and optic nerves are intact. Normal extraocular muscles and intraorbital fat. Sinuses: No acute finding. Soft tissues: Large nasal bridge hematoma. IMPRESSION: 1. No acute intracranial abnormality. 2. Comminuted fractures of the nasal bones and anterior nasal septum with large nasal bridge hematoma. 3. Numerous lucent lesions throughout the calvarium, the largest of which is at the left frontal calvarium and extends nearly the full thickness of the skull. The appearance is most consistent with multiple myeloma. Electronically Signed   By: Ulyses Jarred M.D.   On: 11/22/2019 20:55   CT Maxillofacial WO CM  Result Date: 11/22/2019 CLINICAL DATA:  Fall with facial trauma EXAM: CT HEAD WITHOUT CONTRAST CT MAXILLOFACIAL WITHOUT CONTRAST TECHNIQUE: Multidetector CT imaging of the head and maxillofacial structures were performed using the standard protocol without intravenous contrast. Multiplanar CT image reconstructions of the maxillofacial structures were also generated. COMPARISON:  None. FINDINGS: CT HEAD FINDINGS Brain: There is no mass, hemorrhage or extra-axial collection. The size and configuration of the ventricles and extra-axial CSF spaces are normal. The brain parenchyma is normal, without evidence of acute or chronic infarction. Vascular: No hyperdense vessel or unexpected vascular calcification. Skull: No skull fracture. There are numerous lucent lesions throughout  the calvarium. The largest is at the left frontal calvarium and extends nearly the full thickness of the skull. CT MAXILLOFACIAL FINDINGS Osseous: --Complex facial fracture types: No LeFort, zygomaticomaxillary complex or nasoorbitoethmoidal fracture. --Simple fracture types: Comminuted fractures of the nasal bones and anterior nasal septum. --Mandible, hard palate and teeth: No acute abnormality. Orbits: The globes and optic nerves are intact. Normal extraocular muscles and intraorbital fat. Sinuses: No acute finding. Soft tissues: Large nasal bridge hematoma. IMPRESSION: 1. No acute intracranial abnormality. 2. Comminuted fractures of the nasal bones and anterior nasal septum with  large nasal bridge hematoma. 3. Numerous lucent lesions throughout the calvarium, the largest of which is at the left frontal calvarium and extends nearly the full thickness of the skull. The appearance is most consistent with multiple myeloma. Electronically Signed   By: Ulyses Jarred M.D.   On: 11/22/2019 20:55    Procedures .[name] KitchenLaceration Repair  Date/Time: 11/22/2019 9:07 PM Performed by: Lajean Saver, MD Authorized by: Lajean Saver, MD   Consent:    Consent obtained:  Verbal   Consent given by:  Patient Anesthesia (see MAR for exact dosages):    Anesthesia method:  Local infiltration   Local anesthetic:  Lidocaine 2% WITH epi Laceration details:    Location:  Face   Face location:  Nose   Length (cm):  2 Repair type:    Repair type:  Simple Pre-procedure details:    Preparation:  Patient was prepped and draped in usual sterile fashion and imaging obtained to evaluate for foreign bodies Exploration:    Wound exploration: entire depth of wound probed and visualized     Wound extent: no foreign bodies/material noted     Contaminated: no   Treatment:    Area cleansed with:  Saline   Amount of cleaning:  Standard   Irrigation solution:  Sterile saline   Irrigation method:  Syringe   Visualized foreign  bodies/material removed: no   Skin repair:    Repair method:  Sutures   Suture size:  6-0   Suture material:  Prolene   Suture technique:  Simple interrupted   Number of sutures:  4 Approximation:    Approximation:  Close Post-procedure details:    Dressing:  Antibiotic ointment   Patient tolerance of procedure:  Tolerated well, no immediate complications   (including critical care time)  Medications Ordered in ED Medications  lidocaine-EPINEPHrine (XYLOCAINE W/EPI) 2 %-1:200000 (PF) injection 20 mL (has no administration in time range)  Tdap (BOOSTRIX) injection 0.5 mL (has no administration in time range)    ED Course  I have reviewed the triage vital signs and the nursing notes.  Pertinent labs & imaging results that were available during my care of the patient were reviewed by me and considered in my medical decision making (see chart for details).    MDM Rules/Calculators/A&P                     Imaging studies ordered.   Reviewed nursing notes and prior charts for additional history.  Recent heme/onc workup related to her myeloma.   Wound sutured. Tetanus IM.  Patient declines any medication for pain while in ED - offered x 2.   CTs reviewed/interpreted by me - +nasal bone fractures.   Recheck spine nt.   Pt now states acetaminophen is ok for her - po given. Icepack to sore area.   Patient appears stable for d/c.   ENT f/u re nasal fxs.   Return precautions provided.     Final Clinical Impression(s) / ED Diagnoses Final diagnoses:  None    Rx / DC Orders ED Discharge Orders    None       Lajean Saver, MD 11/22/19 2108

## 2019-11-23 ENCOUNTER — Telehealth: Payer: Self-pay

## 2019-11-23 MED FILL — CEPHALEXIN 500 MG CAPSULE: 500 | 5 days supply | Qty: 15 | Fill #0

## 2019-11-23 NOTE — Telephone Encounter (Signed)
Called patient and made her aware that Dr. Alen Blew has been aware. She verbalized understanding.

## 2019-11-28 NOTE — Progress Notes (Signed)
Pharmacist Chemotherapy Monitoring - Initial Assessment    Anticipated start date: 12/02/19  Regimen:  . Are orders appropriate based on the patient's diagnosis, regimen, and cycle? Yes . Does the plan date match the patient's scheduled date? Yes . Is the sequencing of drugs appropriate? Yes . Are the premedications appropriate for the patient's regimen? Yes . Prior Authorization for treatment is: Not Started o If applicable, is the correct biosimilar selected based on the patient's insurance? not applicable  Organ Function and Labs: [name] Kitchen Are dose adjustments needed based on the patient's renal function, hepatic function, or hematologic function? No . Are appropriate labs ordered prior to the start of patient's treatment? Yes . Other organ system assessment, if indicated: N/A . The following baseline labs, if indicated, have been ordered: daratumumab: blood type and screen  Dose Assessment: . Are the drug doses appropriate? Yes . Are the following correct: o Drug concentrations Yes o IV fluid compatible with drug Yes o Administration routes Yes o Timing of therapy Yes If applicable, does the patient have documented access for treatment and/or plans for port-a-cath placement? not applicable . If applicable, have lifetime cumulative doses been properly documented and assessed? not applicable Lifetime Dose Tracking  No doses have been documented on this patient for the following tracked chemicals: Doxorubicin, Epirubicin, Idarubicin, Daunorubicin, Mitoxantrone, Bleomycin, Oxaliplatin, Carboplatin, Liposomal Doxorubicin  o   Toxicity Monitoring/Prevention: . The patient has the following take home antiemetics prescribed: Dexamethasone . The patient has the following take home medications prescribed: VZV prophylaxis . Medication allergies and previous infusion related reactions, if applicable, have been reviewed and addressed. Yes . The patient's current medication list has been assessed  for drug-drug interactions with their chemotherapy regimen. no significant drug-drug interactions were identified on review.  Order Review: . Are the treatment plan orders signed? No . Is the patient scheduled to see a provider prior to their treatment? No  I verify that I have reviewed each item in the above checklist and answered each question accordingly.  Nicole Keith 11/28/2019 2:32 PM

## 2019-12-01 ENCOUNTER — Other Ambulatory Visit: Payer: Self-pay | Admitting: Oncology

## 2019-12-02 ENCOUNTER — Other Ambulatory Visit: Payer: Medicare Other

## 2019-12-02 ENCOUNTER — Other Ambulatory Visit: Payer: Self-pay

## 2019-12-02 ENCOUNTER — Inpatient Hospital Stay: Payer: Medicare Other

## 2019-12-02 ENCOUNTER — Ambulatory Visit: Payer: Medicare Other

## 2019-12-02 VITALS — BP 130/65 | HR 80 | Temp 99.1°F | Resp 17 | Ht 66.0 in | Wt 140.4 lb

## 2019-12-02 DIAGNOSIS — C9000 Multiple myeloma not having achieved remission: Secondary | ICD-10-CM

## 2019-12-02 DIAGNOSIS — Z5112 Encounter for antineoplastic immunotherapy: Secondary | ICD-10-CM | POA: Diagnosis not present

## 2019-12-02 LAB — CMP (CANCER CENTER ONLY)
ALT: 16 U/L (ref 0–44)
AST: 20 U/L (ref 15–41)
Albumin: 2.5 g/dL — ABNORMAL LOW (ref 3.5–5.0)
Alkaline Phosphatase: 48 U/L (ref 38–126)
Anion gap: 9 (ref 5–15)
BUN: 9 mg/dL (ref 8–23)
CO2: 24 mmol/L (ref 22–32)
Calcium: 9 mg/dL (ref 8.9–10.3)
Chloride: 98 mmol/L (ref 98–111)
Creatinine: 0.82 mg/dL (ref 0.44–1.00)
GFR, Est AFR Am: >60 mL/min (ref >60)
GFR, Estimated: >60 mL/min (ref >60)
Glucose, Bld: 150 mg/dL — ABNORMAL HIGH (ref 70–99)
Potassium: 4 mmol/L (ref 3.5–5.1)
Sodium: 131 mmol/L — ABNORMAL LOW (ref 135–145)
Total Bilirubin: 0.4 mg/dL (ref 0.3–1.2)
Total Protein: 10.8 g/dL — ABNORMAL HIGH (ref 6.5–8.1)

## 2019-12-02 LAB — CBC WITH DIFFERENTIAL (CANCER CENTER ONLY)
Abs Immature Granulocytes: 0.06 10*3/uL (ref 0.00–0.07)
Basophils Absolute: 0 10*3/uL (ref 0.0–0.1)
Basophils Relative: 1 %
Eosinophils Absolute: 0 10*3/uL (ref 0.0–0.5)
Eosinophils Relative: 1 %
HCT: 31.3 % — ABNORMAL LOW (ref 36.0–46.0)
Hemoglobin: 10.2 g/dL — ABNORMAL LOW (ref 12.0–15.0)
Immature Granulocytes: 1 %
Lymphocytes Relative: 23 %
Lymphs Abs: 1.6 10*3/uL (ref 0.7–4.0)
MCH: 32.2 pg (ref 26.0–34.0)
MCHC: 32.6 g/dL (ref 30.0–36.0)
MCV: 98.7 fL (ref 80.0–100.0)
Monocytes Absolute: 0.8 10*3/uL (ref 0.1–1.0)
Monocytes Relative: 12 %
Neutro Abs: 4.3 10*3/uL (ref 1.7–7.7)
Neutrophils Relative %: 62 %
Platelet Count: 251 10*3/uL (ref 150–400)
RBC: 3.17 MIL/uL — ABNORMAL LOW (ref 3.87–5.11)
RDW: 14.5 % (ref 11.5–15.5)
WBC Count: 6.7 10*3/uL (ref 4.0–10.5)
nRBC: 0 % (ref 0.0–0.2)

## 2019-12-02 LAB — ABO/RH: ABO/RH(D): A POS

## 2019-12-02 LAB — TYPE AND SCREEN
ABO/RH(D): A POS
Antibody Screen: NEGATIVE

## 2019-12-02 MED ORDER — MONTELUKAST SODIUM 10 MG PO TABS
10.0000 mg | ORAL_TABLET | Freq: Once | ORAL | Status: AC
  Administered 2019-12-02: 10 mg via ORAL

## 2019-12-02 MED ORDER — MONTELUKAST SODIUM 10 MG PO TABS
ORAL_TABLET | ORAL | Status: AC
  Filled 2019-12-02: qty 1

## 2019-12-02 MED ORDER — DEXAMETHASONE 4 MG PO TABS
20.0000 mg | ORAL_TABLET | Freq: Once | ORAL | Status: AC
  Administered 2019-12-02: 20 mg via ORAL

## 2019-12-02 MED ORDER — DIPHENHYDRAMINE HCL 25 MG PO CAPS
50.0000 mg | ORAL_CAPSULE | Freq: Once | ORAL | Status: AC
  Administered 2019-12-02: 50 mg via ORAL

## 2019-12-02 MED ORDER — DIPHENHYDRAMINE HCL 25 MG PO CAPS
ORAL_CAPSULE | ORAL | Status: AC
  Filled 2019-12-02: qty 2

## 2019-12-02 MED ORDER — DARATUMUMAB-HYALURONIDASE-FIHJ 1800-30000 MG-UT/15ML ~~LOC~~ SOLN
1800.0000 mg | Freq: Once | SUBCUTANEOUS | Status: AC
  Administered 2019-12-02: 1800 mg via SUBCUTANEOUS
  Filled 2019-12-02: qty 15

## 2019-12-02 MED ORDER — ACETAMINOPHEN 325 MG PO TABS
650.0000 mg | ORAL_TABLET | Freq: Once | ORAL | Status: AC
  Administered 2019-12-02: 650 mg via ORAL

## 2019-12-02 MED ORDER — DEXAMETHASONE 4 MG PO TABS
ORAL_TABLET | ORAL | Status: AC
  Filled 2019-12-02: qty 5

## 2019-12-02 MED ORDER — ACETAMINOPHEN 325 MG PO TABS
ORAL_TABLET | ORAL | Status: AC
  Filled 2019-12-02: qty 2

## 2019-12-02 NOTE — Patient Instructions (Addendum)
[facility] Discharge Instructions for Patients Receiving Chemotherapy  Today you received the following chemotherapy agents: Darzalex Faspro   To help prevent nausea and vomiting after your treatment, we encourage you to take your nausea medication as directed.   If you develop nausea and vomiting that is not controlled by your nausea medication, call the clinic.   BELOW ARE SYMPTOMS THAT SHOULD BE REPORTED IMMEDIATELY:  *FEVER GREATER THAN 100.5 F  *CHILLS WITH OR WITHOUT FEVER  NAUSEA AND VOMITING THAT IS NOT CONTROLLED WITH YOUR NAUSEA MEDICATION  *UNUSUAL SHORTNESS OF BREATH  *UNUSUAL BRUISING OR BLEEDING  TENDERNESS IN MOUTH AND THROAT WITH OR WITHOUT PRESENCE OF ULCERS  *URINARY PROBLEMS  *BOWEL PROBLEMS  UNUSUAL RASH Items with * indicate a potential emergency and should be followed up as soon as possible.  Feel free to call the clinic should you have any questions or concerns. The clinic phone number is [phone].  Please show the CHEMO ALERT CARD at check-in to the Emergency Department and triage nurse.  Daratumumab injection What is this medicine? DARATUMUMAB (dar a toom ue mab) is a monoclonal antibody. It is used to treat multiple myeloma. This medicine may be used for other purposes; ask your health care provider or pharmacist if you have questions. COMMON BRAND NAME(S): DARZALEX What should I tell my health care provider before I take this medicine? They need to know if you have any of these conditions:  infection (especially a virus infection such as chickenpox, herpes, or hepatitis B virus)  lung or breathing disease  an unusual or allergic reaction to daratumumab, other medicines, foods, dyes, or preservatives  pregnant or trying to get pregnant  breast-feeding How should I use this medicine? This medicine is for infusion into a vein. It is given by a health care professional in a hospital or clinic setting. Talk to your  pediatrician regarding the use of this medicine in children. Special care may be needed. Overdosage: If you think you have taken too much of this medicine contact a poison control center or emergency room at once. NOTE: This medicine is only for you. Do not share this medicine with others. What if I miss a dose? Keep appointments for follow-up doses as directed. It is important not to miss your dose. Call your doctor or health care professional if you are unable to keep an appointment. What may interact with this medicine? Interactions have not been studied. This list may not describe all possible interactions. Give your health care provider a list of all the medicines, herbs, non-prescription drugs, or dietary supplements you use. Also tell them if you smoke, drink alcohol, or use illegal drugs. Some items may interact with your medicine. What should I watch for while using this medicine? This drug may make you feel generally unwell. Report any side effects. Continue your course of treatment even though you feel ill unless your doctor tells you to stop. This medicine can cause serious allergic reactions. To reduce your risk you may need to take medicine before treatment with this medicine. Take your medicine as directed. This medicine can affect the results of blood tests to match your blood type. These changes can last for up to 6 months after the final dose. Your healthcare provider will do blood tests to match your blood type before you start treatment. Tell all of your healthcare providers that you are being treated with this medicine before receiving a blood transfusion. This medicine can affect the results of   some tests used to determine treatment response; extra tests may be needed to evaluate response. Do not become pregnant while taking this medicine or for 3 months after stopping it. Women should inform their doctor if they wish to become pregnant or think they might be pregnant. There is a  potential for serious side effects to an unborn child. Talk to your health care professional or pharmacist for more information. What side effects may I notice from receiving this medicine? Side effects that you should report to your doctor or health care professional as soon as possible:  allergic reactions like skin rash, itching or hives, swelling of the face, lips, or tongue  breathing problems  chills  cough  dizziness  feeling faint or lightheaded  headache  low blood counts - this medicine may decrease the number of white blood cells, red blood cells and platelets. You may be at increased risk for infections and bleeding.  nausea, vomiting  shortness of breath  signs of decreased platelets or bleeding - bruising, pinpoint red spots on the skin, black, tarry stools, blood in the urine  signs of decreased red blood cells - unusually weak or tired, feeling faint or lightheaded, falls  signs of infection - fever or chills, cough, sore throat, pain or difficulty passing urine  signs and symptoms of liver injury like dark yellow or brown urine; general ill feeling or flu-like symptoms; light-colored stools; loss of appetite; right upper belly pain; unusually weak or tired; yellowing of the eyes or skin Side effects that usually do not require medical attention (report to your doctor or health care professional if they continue or are bothersome):  back pain  constipation  diarrhea  joint pain  muscle cramps  pain, tingling, numbness in the hands or feet  swelling of the ankles, feet, hands  tiredness  trouble sleeping This list may not describe all possible side effects. Call your doctor for medical advice about side effects. You may report side effects to FDA at 1-800-FDA-1088. Where should I keep my medicine? This drug is given in a hospital or clinic and will not be stored at home. NOTE: This sheet is a summary. It may not cover all possible information. If  you have questions about this medicine, talk to your doctor, pharmacist, or health care provider.  2020 Elsevier/Gold Standard (2019-04-05 18:10:54)   

## 2019-12-02 NOTE — Progress Notes (Signed)
Faspro administered in RL abdomen. RN assessed 2 hours post injection. No redness, warmth, or discomfort in area of injection. Patient tolerated well.

## 2019-12-05 ENCOUNTER — Telehealth: Payer: Self-pay | Admitting: Emergency Medicine

## 2019-12-05 MED FILL — NINLARO 4 MG CAPS: 4 | 28 days supply | Qty: 3 | Fill #1

## 2019-12-05 NOTE — Telephone Encounter (Signed)
Chemo f/u call, spoke with patient.  Pt reports increasing fatigue, particularly in the afternoons after taking her medications.  Pt requesting that her next lab values be closely reviewed by MD Shadad to see if she can possibly reduce her dose or consolidate some of her medications to cut back on her fatigue.  Pt states that she is tolerating the medications now but just wanted to let MD [facility] office know.  Pt denies any other questions/concerns or side effects from recent infusion at this time and is aware to f/u as needed before her next appt.  Confirmed next lab/infusion appt date w/pt.

## 2019-12-05 NOTE — Progress Notes (Signed)

## 2019-12-05 NOTE — Telephone Encounter (Signed)
-----   Message from Wylene Men, RN sent at 12/02/2019  3:56 PM EDT ----- Regarding: Export Patient received first time dose of Darzalex Faspro. Tolerated well. No c/o or s/s of discomfort or distress.

## 2019-12-08 ENCOUNTER — Telehealth: Payer: Self-pay

## 2019-12-08 NOTE — Telephone Encounter (Signed)
Called and informed patient of information listed below. Patient verbalized understanding.  All questions were answered during phone call. Patient was advised to call office if she had any additional questions or concerns.

## 2019-12-08 NOTE — Telephone Encounter (Signed)
-----   Message from Wyatt Portela, MD sent at 12/08/2019 10:13 AM EDT ----- Regarding: RE: Patient Question It is natural when starting a new cancer medication she experience more side effects including fatigue and tiredness which should improve with time.  This is not a sign of anything serious going on.  I will address on the next visit in the meantime.  Thanks ----- Message ----- From: Teodoro Spray, RN Sent: 12/08/2019   9:27 AM EDT To: Wyatt Portela, MD Subject: Patient Question                               Patient states she had intense pain in L side ribs that radiated across her lower back last night.  Patient states she took some ibuprofen this morning and pain has subsided. Patient states she has had back pain for the last 10 days and says she feels "very tired" in the afternoon. Patient wants to know if the pain could possibly be from the Darzalex injection she had. Last injection was 11/29/19 and patient is scheduled for next injection on 12/09/19.  Patient also states she feels as if she is "taking too many medications and her body can't absorb them". Patient wants to know if the medications are the cause of her "instability and decreased function". Patient states she has been increasingly tired during the afternoon/evening and has fallen twice in the last 6 weeks.   Please advise.

## 2019-12-09 ENCOUNTER — Ambulatory Visit: Payer: Medicare Other

## 2019-12-09 ENCOUNTER — Inpatient Hospital Stay: Payer: Medicare Other

## 2019-12-09 ENCOUNTER — Other Ambulatory Visit: Payer: Medicare Other

## 2019-12-09 ENCOUNTER — Other Ambulatory Visit: Payer: Self-pay

## 2019-12-09 VITALS — BP 137/61 | HR 72 | Temp 99.1°F | Resp 18 | Wt 145.0 lb

## 2019-12-09 DIAGNOSIS — C9000 Multiple myeloma not having achieved remission: Secondary | ICD-10-CM

## 2019-12-09 DIAGNOSIS — Z5112 Encounter for antineoplastic immunotherapy: Secondary | ICD-10-CM | POA: Diagnosis not present

## 2019-12-09 DIAGNOSIS — M8000XD Age-related osteoporosis with current pathological fracture, unspecified site, subsequent encounter for fracture with routine healing: Secondary | ICD-10-CM

## 2019-12-09 LAB — CMP (CANCER CENTER ONLY)
ALT: 12 U/L (ref 0–44)
AST: 16 U/L (ref 15–41)
Albumin: 2.4 g/dL — ABNORMAL LOW (ref 3.5–5.0)
Alkaline Phosphatase: 56 U/L (ref 38–126)
Anion gap: 5 (ref 5–15)
BUN: 10 mg/dL (ref 8–23)
CO2: 25 mmol/L (ref 22–32)
Calcium: 8.8 mg/dL — ABNORMAL LOW (ref 8.9–10.3)
Chloride: 97 mmol/L — ABNORMAL LOW (ref 98–111)
Creatinine: 0.7 mg/dL (ref 0.44–1.00)
GFR, Est AFR Am: >60 mL/min (ref >60)
GFR, Estimated: >60 mL/min (ref >60)
Glucose, Bld: 99 mg/dL (ref 70–99)
Potassium: 4.2 mmol/L (ref 3.5–5.1)
Sodium: 127 mmol/L — ABNORMAL LOW (ref 135–145)
Total Bilirubin: 0.3 mg/dL (ref 0.3–1.2)
Total Protein: 9.6 g/dL — ABNORMAL HIGH (ref 6.5–8.1)

## 2019-12-09 LAB — CBC WITH DIFFERENTIAL (CANCER CENTER ONLY)
Abs Immature Granulocytes: 0.03 10*3/uL (ref 0.00–0.07)
Basophils Absolute: 0 10*3/uL (ref 0.0–0.1)
Basophils Relative: 0 %
Eosinophils Absolute: 0.1 10*3/uL (ref 0.0–0.5)
Eosinophils Relative: 1 %
HCT: 28.8 % — ABNORMAL LOW (ref 36.0–46.0)
Hemoglobin: 9.6 g/dL — ABNORMAL LOW (ref 12.0–15.0)
Immature Granulocytes: 1 %
Lymphocytes Relative: 20 %
Lymphs Abs: 1.3 10*3/uL (ref 0.7–4.0)
MCH: 32.5 pg (ref 26.0–34.0)
MCHC: 33.3 g/dL (ref 30.0–36.0)
MCV: 97.6 fL (ref 80.0–100.0)
Monocytes Absolute: 0.6 10*3/uL (ref 0.1–1.0)
Monocytes Relative: 9 %
Neutro Abs: 4.4 10*3/uL (ref 1.7–7.7)
Neutrophils Relative %: 69 %
Platelet Count: 373 10*3/uL (ref 150–400)
RBC: 2.95 MIL/uL — ABNORMAL LOW (ref 3.87–5.11)
RDW: 14.5 % (ref 11.5–15.5)
WBC Count: 6.4 10*3/uL (ref 4.0–10.5)
nRBC: 0 % (ref 0.0–0.2)

## 2019-12-09 MED ORDER — ZOLEDRONIC ACID 4 MG/100ML IV SOLN
4.0000 mg | Freq: Once | INTRAVENOUS | Status: AC
  Administered 2019-12-09: 4 mg via INTRAVENOUS

## 2019-12-09 MED ORDER — ACETAMINOPHEN 325 MG PO TABS
ORAL_TABLET | ORAL | Status: AC
Start: 2019-12-09 — End: ?
  Filled 2019-12-09: qty 2

## 2019-12-09 MED ORDER — ZOLEDRONIC ACID 4 MG/100ML IV SOLN
INTRAVENOUS | Status: AC
  Filled 2019-12-09: qty 100

## 2019-12-09 MED ORDER — MONTELUKAST SODIUM 10 MG PO TABS
10.0000 mg | ORAL_TABLET | Freq: Once | ORAL | Status: AC
  Administered 2019-12-09: 10 mg via ORAL

## 2019-12-09 MED ORDER — ACETAMINOPHEN 325 MG PO TABS
650.0000 mg | ORAL_TABLET | Freq: Once | ORAL | Status: AC
  Administered 2019-12-09: 650 mg via ORAL

## 2019-12-09 MED ORDER — DEXAMETHASONE 4 MG PO TABS
20.0000 mg | ORAL_TABLET | Freq: Once | ORAL | Status: AC
  Administered 2019-12-09: 20 mg via ORAL

## 2019-12-09 MED ORDER — SODIUM CHLORIDE 0.9 % IV SOLN
Freq: Once | INTRAVENOUS | Status: AC
  Filled 2019-12-09: qty 250

## 2019-12-09 MED ORDER — DIPHENHYDRAMINE HCL 25 MG PO CAPS
50.0000 mg | ORAL_CAPSULE | Freq: Once | ORAL | Status: AC
  Administered 2019-12-09: 50 mg via ORAL

## 2019-12-09 MED ORDER — DARATUMUMAB-HYALURONIDASE-FIHJ 1800-30000 MG-UT/15ML ~~LOC~~ SOLN
1800.0000 mg | Freq: Once | SUBCUTANEOUS | Status: AC
  Administered 2019-12-09: 1800 mg via SUBCUTANEOUS
  Filled 2019-12-09: qty 15

## 2019-12-09 MED ORDER — MONTELUKAST SODIUM 10 MG PO TABS
ORAL_TABLET | ORAL | Status: AC
  Filled 2019-12-09: qty 1

## 2019-12-09 MED ORDER — DIPHENHYDRAMINE HCL 25 MG PO CAPS
ORAL_CAPSULE | ORAL | Status: AC
  Filled 2019-12-09: qty 2

## 2019-12-09 MED ORDER — SODIUM CHLORIDE 0.9 % IV SOLN
20.0000 mg | Freq: Once | INTRAVENOUS | Status: DC
  Filled 2019-12-09: qty 2

## 2019-12-09 MED ORDER — DEXAMETHASONE 4 MG PO TABS
ORAL_TABLET | ORAL | Status: AC
  Filled 2019-12-09: qty 5

## 2019-12-09 NOTE — Patient Instructions (Signed)
[facility] Discharge Instructions for Patients Receiving Chemotherapy  Today you received the following chemotherapy agents: Darzalex Faspro  To help prevent nausea and vomiting after your treatment, we encourage you to take your nausea medication as directed.   If you develop nausea and vomiting that is not controlled by your nausea medication, call the clinic.   BELOW ARE SYMPTOMS THAT SHOULD BE REPORTED IMMEDIATELY:  *FEVER GREATER THAN 100.5 F  *CHILLS WITH OR WITHOUT FEVER  NAUSEA AND VOMITING THAT IS NOT CONTROLLED WITH YOUR NAUSEA MEDICATION  *UNUSUAL SHORTNESS OF BREATH  *UNUSUAL BRUISING OR BLEEDING  TENDERNESS IN MOUTH AND THROAT WITH OR WITHOUT PRESENCE OF ULCERS  *URINARY PROBLEMS  *BOWEL PROBLEMS  UNUSUAL RASH Items with * indicate a potential emergency and should be followed up as soon as possible.  Feel free to call the clinic should you have any questions or concerns. The clinic phone number is [phone].  Please show the CHEMO ALERT CARD at check-in to the Emergency Department and triage nurse.  Zoledronic Acid injection (Hypercalcemia, Oncology) What is this medicine? ZOLEDRONIC ACID (ZOE le dron ik AS id) lowers the amount of calcium loss from bone. It is used to treat too much calcium in your blood from cancer. It is also used to prevent complications of cancer that has spread to the bone. This medicine may be used for other purposes; ask your health care provider or pharmacist if you have questions. COMMON BRAND NAME(S): Zometa What should I tell my health care provider before I take this medicine? They need to know if you have any of these conditions:  aspirin-sensitive asthma  cancer, especially if you are receiving medicines used to treat cancer  dental disease or wear dentures  infection  kidney disease  receiving corticosteroids like dexamethasone or prednisone  an unusual or allergic reaction to zoledronic acid,  other medicines, foods, dyes, or preservatives  pregnant or trying to get pregnant  breast-feeding How should I use this medicine? This medicine is for infusion into a vein. It is given by a health care professional in a hospital or clinic setting. Talk to your pediatrician regarding the use of this medicine in children. Special care may be needed. Overdosage: If you think you have taken too much of this medicine contact a poison control center or emergency room at once. NOTE: This medicine is only for you. Do not share this medicine with others. What if I miss a dose? It is important not to miss your dose. Call your doctor or health care professional if you are unable to keep an appointment. What may interact with this medicine?  certain antibiotics given by injection  NSAIDs, medicines for pain and inflammation, like ibuprofen or naproxen  some diuretics like bumetanide, furosemide  teriparatide  thalidomide This list may not describe all possible interactions. Give your health care provider a list of all the medicines, herbs, non-prescription drugs, or dietary supplements you use. Also tell them if you smoke, drink alcohol, or use illegal drugs. Some items may interact with your medicine. What should I watch for while using this medicine? Visit your doctor or health care professional for regular checkups. It may be some time before you see the benefit from this medicine. Do not stop taking your medicine unless your doctor tells you to. Your doctor may order blood tests or other tests to see how you are doing. Women should inform their doctor if they wish to become pregnant or think they might be   be pregnant. There is a potential for serious side effects to an unborn child. Talk to your health care professional or pharmacist for more information. You should make sure that you get enough calcium and vitamin D while you are taking this medicine. Discuss the foods you eat and the vitamins you  take with your health care professional. Some people who take this medicine have severe bone, joint, and/or muscle pain. This medicine may also increase your risk for jaw problems or a broken thigh bone. Tell your doctor right away if you have severe pain in your jaw, bones, joints, or muscles. Tell your doctor if you have any pain that does not go away or that gets worse. Tell your dentist and dental surgeon that you are taking this medicine. You should not have major dental surgery while on this medicine. See your dentist to have a dental exam and fix any dental problems before starting this medicine. Take good care of your teeth while on this medicine. Make sure you see your dentist for regular follow-up appointments. What side effects may I notice from receiving this medicine? Side effects that you should report to your doctor or health care professional as soon as possible:  allergic reactions like skin rash, itching or hives, swelling of the face, lips, or tongue  anxiety, confusion, or depression  breathing problems  changes in vision  eye pain  feeling faint or lightheaded, falls  jaw pain, especially after dental work  mouth sores  muscle cramps, stiffness, or weakness  redness, blistering, peeling or loosening of the skin, including inside the mouth  trouble passing urine or change in the amount of urine Side effects that usually do not require medical attention (report to your doctor or health care professional if they continue or are bothersome):  bone, joint, or muscle pain  constipation  diarrhea  fever  hair loss  irritation at site where injected  loss of appetite  nausea, vomiting  stomach upset  trouble sleeping  trouble swallowing  weak or tired This list may not describe all possible side effects. Call your doctor for medical advice about side effects. You may report side effects to FDA at 1-800-FDA-1088. Where should I keep my medicine? This  drug is given in a hospital or clinic and will not be stored at home. NOTE: This sheet is a summary. It may not cover all possible information. If you have questions about this medicine, talk to your doctor, pharmacist, or health care provider.  2020 Elsevier/Gold Standard (2013-12-24 14:19:39)

## 2019-12-09 NOTE — Progress Notes (Signed)
Per Dr. Alen Blew, no post observation period needed post darzalex faspro injection.

## 2019-12-16 ENCOUNTER — Inpatient Hospital Stay: Payer: Medicare Other

## 2019-12-16 ENCOUNTER — Inpatient Hospital Stay: Payer: Medicare Other | Attending: Oncology

## 2019-12-16 ENCOUNTER — Ambulatory Visit: Payer: Medicare Other

## 2019-12-16 ENCOUNTER — Other Ambulatory Visit: Payer: Self-pay

## 2019-12-16 ENCOUNTER — Other Ambulatory Visit: Payer: Medicare Other

## 2019-12-16 VITALS — BP 145/72 | HR 71 | Temp 98.2°F | Resp 17

## 2019-12-16 DIAGNOSIS — Z5112 Encounter for antineoplastic immunotherapy: Secondary | ICD-10-CM | POA: Diagnosis present

## 2019-12-16 DIAGNOSIS — C9001 Multiple myeloma in remission: Secondary | ICD-10-CM | POA: Diagnosis present

## 2019-12-16 DIAGNOSIS — C9000 Multiple myeloma not having achieved remission: Secondary | ICD-10-CM

## 2019-12-16 LAB — CMP (CANCER CENTER ONLY)
ALT: 22 U/L (ref 0–44)
AST: 19 U/L (ref 15–41)
Albumin: 2.6 g/dL — ABNORMAL LOW (ref 3.5–5.0)
Alkaline Phosphatase: 72 U/L (ref 38–126)
Anion gap: 5 (ref 5–15)
BUN: 10 mg/dL (ref 8–23)
CO2: 25 mmol/L (ref 22–32)
Calcium: 8.3 mg/dL — ABNORMAL LOW (ref 8.9–10.3)
Chloride: 98 mmol/L (ref 98–111)
Creatinine: 0.72 mg/dL (ref 0.44–1.00)
GFR, Est AFR Am: >60 mL/min (ref >60)
GFR, Estimated: >60 mL/min (ref >60)
Glucose, Bld: 82 mg/dL (ref 70–99)
Potassium: 4.1 mmol/L (ref 3.5–5.1)
Sodium: 128 mmol/L — ABNORMAL LOW (ref 135–145)
Total Bilirubin: 0.3 mg/dL (ref 0.3–1.2)
Total Protein: 9.8 g/dL — ABNORMAL HIGH (ref 6.5–8.1)

## 2019-12-16 LAB — CBC WITH DIFFERENTIAL (CANCER CENTER ONLY)
Abs Immature Granulocytes: 0.02 10*3/uL (ref 0.00–0.07)
Basophils Absolute: 0 10*3/uL (ref 0.0–0.1)
Basophils Relative: 0 %
Eosinophils Absolute: 0 10*3/uL (ref 0.0–0.5)
Eosinophils Relative: 0 %
HCT: 30.3 % — ABNORMAL LOW (ref 36.0–46.0)
Hemoglobin: 10 g/dL — ABNORMAL LOW (ref 12.0–15.0)
Immature Granulocytes: 0 %
Lymphocytes Relative: 21 %
Lymphs Abs: 1.4 10*3/uL (ref 0.7–4.0)
MCH: 31.6 pg (ref 26.0–34.0)
MCHC: 33 g/dL (ref 30.0–36.0)
MCV: 95.9 fL (ref 80.0–100.0)
Monocytes Absolute: 0.6 10*3/uL (ref 0.1–1.0)
Monocytes Relative: 10 %
Neutro Abs: 4.5 10*3/uL (ref 1.7–7.7)
Neutrophils Relative %: 69 %
Platelet Count: 340 10*3/uL (ref 150–400)
RBC: 3.16 MIL/uL — ABNORMAL LOW (ref 3.87–5.11)
RDW: 14.6 % (ref 11.5–15.5)
WBC Count: 6.5 10*3/uL (ref 4.0–10.5)
nRBC: 0 % (ref 0.0–0.2)

## 2019-12-16 MED ORDER — MONTELUKAST SODIUM 10 MG PO TABS
10.0000 mg | ORAL_TABLET | Freq: Once | ORAL | Status: AC
  Administered 2019-12-16: 10 mg via ORAL

## 2019-12-16 MED ORDER — DEXAMETHASONE 4 MG PO TABS
ORAL_TABLET | ORAL | Status: AC
  Filled 2019-12-16: qty 5

## 2019-12-16 MED ORDER — DEXAMETHASONE 4 MG PO TABS
20.0000 mg | ORAL_TABLET | Freq: Once | ORAL | Status: AC
  Administered 2019-12-16: 20 mg via ORAL

## 2019-12-16 MED ORDER — ACETAMINOPHEN 325 MG PO TABS
650.0000 mg | ORAL_TABLET | Freq: Once | ORAL | Status: AC
  Administered 2019-12-16: 650 mg via ORAL

## 2019-12-16 MED ORDER — DIPHENHYDRAMINE HCL 25 MG PO CAPS
ORAL_CAPSULE | ORAL | Status: AC
  Filled 2019-12-16: qty 2

## 2019-12-16 MED ORDER — DARATUMUMAB-HYALURONIDASE-FIHJ 1800-30000 MG-UT/15ML ~~LOC~~ SOLN
1800.0000 mg | Freq: Once | SUBCUTANEOUS | Status: AC
  Administered 2019-12-16: 1800 mg via SUBCUTANEOUS
  Filled 2019-12-16: qty 15

## 2019-12-16 MED ORDER — MONTELUKAST SODIUM 10 MG PO TABS
ORAL_TABLET | ORAL | Status: AC
  Filled 2019-12-16: qty 1

## 2019-12-16 MED ORDER — DIPHENHYDRAMINE HCL 25 MG PO CAPS
50.0000 mg | ORAL_CAPSULE | Freq: Once | ORAL | Status: AC
  Administered 2019-12-16: 50 mg via ORAL

## 2019-12-16 MED ORDER — ACETAMINOPHEN 325 MG PO TABS
ORAL_TABLET | ORAL | Status: AC
  Filled 2019-12-16: qty 2

## 2019-12-16 NOTE — Patient Instructions (Signed)
[facility] Discharge Instructions for Patients Receiving Chemotherapy  Today you received the following chemotherapy agents: Darzalex Faspro  To help prevent nausea and vomiting after your treatment, we encourage you to take your nausea medication as directed.   If you develop nausea and vomiting that is not controlled by your nausea medication, call the clinic.   BELOW ARE SYMPTOMS THAT SHOULD BE REPORTED IMMEDIATELY:  *FEVER GREATER THAN 100.5 F  *CHILLS WITH OR WITHOUT FEVER  NAUSEA AND VOMITING THAT IS NOT CONTROLLED WITH YOUR NAUSEA MEDICATION  *UNUSUAL SHORTNESS OF BREATH  *UNUSUAL BRUISING OR BLEEDING  TENDERNESS IN MOUTH AND THROAT WITH OR WITHOUT PRESENCE OF ULCERS  *URINARY PROBLEMS  *BOWEL PROBLEMS  UNUSUAL RASH Items with * indicate a potential emergency and should be followed up as soon as possible.  Feel free to call the clinic should you have any questions or concerns. The clinic phone number is [phone].  Please show the CHEMO ALERT CARD at check-in to the Emergency Department and triage nurse.  Zoledronic Acid injection (Hypercalcemia, Oncology) What is this medicine? ZOLEDRONIC ACID (ZOE le dron ik AS id) lowers the amount of calcium loss from bone. It is used to treat too much calcium in your blood from cancer. It is also used to prevent complications of cancer that has spread to the bone. This medicine may be used for other purposes; ask your health care provider or pharmacist if you have questions. COMMON BRAND NAME(S): Zometa What should I tell my health care provider before I take this medicine? They need to know if you have any of these conditions:  aspirin-sensitive asthma  cancer, especially if you are receiving medicines used to treat cancer  dental disease or wear dentures  infection  kidney disease  receiving corticosteroids like dexamethasone or prednisone  an unusual or allergic reaction to zoledronic acid,  other medicines, foods, dyes, or preservatives  pregnant or trying to get pregnant  breast-feeding How should I use this medicine? This medicine is for infusion into a vein. It is given by a health care professional in a hospital or clinic setting. Talk to your pediatrician regarding the use of this medicine in children. Special care may be needed. Overdosage: If you think you have taken too much of this medicine contact a poison control center or emergency room at once. NOTE: This medicine is only for you. Do not share this medicine with others. What if I miss a dose? It is important not to miss your dose. Call your doctor or health care professional if you are unable to keep an appointment. What may interact with this medicine?  certain antibiotics given by injection  NSAIDs, medicines for pain and inflammation, like ibuprofen or naproxen  some diuretics like bumetanide, furosemide  teriparatide  thalidomide This list may not describe all possible interactions. Give your health care provider a list of all the medicines, herbs, non-prescription drugs, or dietary supplements you use. Also tell them if you smoke, drink alcohol, or use illegal drugs. Some items may interact with your medicine. What should I watch for while using this medicine? Visit your doctor or health care professional for regular checkups. It may be some time before you see the benefit from this medicine. Do not stop taking your medicine unless your doctor tells you to. Your doctor may order blood tests or other tests to see how you are doing. Women should inform their doctor if they wish to become pregnant or think they might be   be pregnant. There is a potential for serious side effects to an unborn child. Talk to your health care professional or pharmacist for more information. You should make sure that you get enough calcium and vitamin D while you are taking this medicine. Discuss the foods you eat and the vitamins you  take with your health care professional. Some people who take this medicine have severe bone, joint, and/or muscle pain. This medicine may also increase your risk for jaw problems or a broken thigh bone. Tell your doctor right away if you have severe pain in your jaw, bones, joints, or muscles. Tell your doctor if you have any pain that does not go away or that gets worse. Tell your dentist and dental surgeon that you are taking this medicine. You should not have major dental surgery while on this medicine. See your dentist to have a dental exam and fix any dental problems before starting this medicine. Take good care of your teeth while on this medicine. Make sure you see your dentist for regular follow-up appointments. What side effects may I notice from receiving this medicine? Side effects that you should report to your doctor or health care professional as soon as possible:  allergic reactions like skin rash, itching or hives, swelling of the face, lips, or tongue  anxiety, confusion, or depression  breathing problems  changes in vision  eye pain  feeling faint or lightheaded, falls  jaw pain, especially after dental work  mouth sores  muscle cramps, stiffness, or weakness  redness, blistering, peeling or loosening of the skin, including inside the mouth  trouble passing urine or change in the amount of urine Side effects that usually do not require medical attention (report to your doctor or health care professional if they continue or are bothersome):  bone, joint, or muscle pain  constipation  diarrhea  fever  hair loss  irritation at site where injected  loss of appetite  nausea, vomiting  stomach upset  trouble sleeping  trouble swallowing  weak or tired This list may not describe all possible side effects. Call your doctor for medical advice about side effects. You may report side effects to FDA at 1-800-FDA-1088. Where should I keep my medicine? This  drug is given in a hospital or clinic and will not be stored at home. NOTE: This sheet is a summary. It may not cover all possible information. If you have questions about this medicine, talk to your doctor, pharmacist, or health care provider.  2020 Elsevier/Gold Standard (2013-12-24 14:19:39)

## 2019-12-19 LAB — KAPPA/LAMBDA LIGHT CHAINS
Kappa free light chain: 3.6 mg/L (ref 3.3–19.4)
Kappa, lambda light chain ratio: 0.04 — ABNORMAL LOW (ref 0.26–1.65)
Lambda free light chains: 102.1 mg/L — ABNORMAL HIGH (ref 5.7–26.3)

## 2019-12-19 NOTE — Progress Notes (Signed)
Pharmacist Chemotherapy Monitoring - Follow Up Assessment    I verify that I have reviewed each item in the below checklist:  . Regimen for the patient is scheduled for the appropriate day and plan matches scheduled date. [name] Kitchen Appropriate non-routine labs are ordered dependent on drug ordered. . If applicable, additional medications reviewed and ordered per protocol based on lifetime cumulative doses and/or treatment regimen.   Plan for follow-up and/or issues identified: No . I-vent associated with next due treatment: No . MD and/or nursing notified: No  Javeah Loeza D 12/19/2019 10:39 AM

## 2019-12-20 LAB — MULTIPLE MYELOMA PANEL, SERUM
Albumin SerPl Elph-Mcnc: 3.2 g/dL (ref 2.9–4.4)
Albumin/Glob SerPl: 0.6 — ABNORMAL LOW (ref 0.7–1.7)
Alpha 1: 0.3 g/dL (ref 0.0–0.4)
Alpha2 Glob SerPl Elph-Mcnc: 0.8 g/dL (ref 0.4–1.0)
B-Globulin SerPl Elph-Mcnc: 0.9 g/dL (ref 0.7–1.3)
Gamma Glob SerPl Elph-Mcnc: 4 g/dL — ABNORMAL HIGH (ref 0.4–1.8)
Globulin, Total: 6 g/dL — ABNORMAL HIGH (ref 2.2–3.9)
IgA: 16 mg/dL — ABNORMAL LOW (ref 64–422)
IgG (Immunoglobin G), Serum: 5196 mg/dL — ABNORMAL HIGH (ref 586–1602)
IgM (Immunoglobulin M), Srm: 28 mg/dL (ref 26–217)
M Protein SerPl Elph-Mcnc: 3.8 g/dL — ABNORMAL HIGH
Total Protein ELP: 9.2 g/dL — ABNORMAL HIGH (ref 6.0–8.5)

## 2019-12-23 ENCOUNTER — Inpatient Hospital Stay: Payer: Medicare Other

## 2019-12-23 ENCOUNTER — Inpatient Hospital Stay: Payer: Medicare Other | Admitting: Oncology

## 2019-12-23 ENCOUNTER — Other Ambulatory Visit: Payer: Self-pay

## 2019-12-23 ENCOUNTER — Ambulatory Visit: Payer: Medicare Other

## 2019-12-23 VITALS — BP 137/70 | HR 74 | Temp 98.5°F | Resp 17 | Ht 66.0 in | Wt 137.6 lb

## 2019-12-23 DIAGNOSIS — C9000 Multiple myeloma not having achieved remission: Secondary | ICD-10-CM

## 2019-12-23 DIAGNOSIS — Z5112 Encounter for antineoplastic immunotherapy: Secondary | ICD-10-CM | POA: Diagnosis not present

## 2019-12-23 LAB — CMP (CANCER CENTER ONLY)
ALT: 13 U/L (ref 0–44)
AST: 14 U/L — ABNORMAL LOW (ref 15–41)
Albumin: 2.7 g/dL — ABNORMAL LOW (ref 3.5–5.0)
Alkaline Phosphatase: 84 U/L (ref 38–126)
Anion gap: 4 — ABNORMAL LOW (ref 5–15)
BUN: 10 mg/dL (ref 8–23)
CO2: 25 mmol/L (ref 22–32)
Calcium: 8.2 mg/dL — ABNORMAL LOW (ref 8.9–10.3)
Chloride: 99 mmol/L (ref 98–111)
Creatinine: 0.75 mg/dL (ref 0.44–1.00)
GFR, Est AFR Am: >60 mL/min (ref >60)
GFR, Estimated: >60 mL/min (ref >60)
Glucose, Bld: 138 mg/dL — ABNORMAL HIGH (ref 70–99)
Potassium: 4.1 mmol/L (ref 3.5–5.1)
Sodium: 128 mmol/L — ABNORMAL LOW (ref 135–145)
Total Bilirubin: 0.2 mg/dL — ABNORMAL LOW (ref 0.3–1.2)
Total Protein: 9.4 g/dL — ABNORMAL HIGH (ref 6.5–8.1)

## 2019-12-23 LAB — CBC WITH DIFFERENTIAL (CANCER CENTER ONLY)
Abs Immature Granulocytes: 0.02 10*3/uL (ref 0.00–0.07)
Basophils Absolute: 0 10*3/uL (ref 0.0–0.1)
Basophils Relative: 0 %
Eosinophils Absolute: 0 10*3/uL (ref 0.0–0.5)
Eosinophils Relative: 0 %
HCT: 32.1 % — ABNORMAL LOW (ref 36.0–46.0)
Hemoglobin: 10.4 g/dL — ABNORMAL LOW (ref 12.0–15.0)
Immature Granulocytes: 0 %
Lymphocytes Relative: 22 %
Lymphs Abs: 1.4 10*3/uL (ref 0.7–4.0)
MCH: 31.8 pg (ref 26.0–34.0)
MCHC: 32.4 g/dL (ref 30.0–36.0)
MCV: 98.2 fL (ref 80.0–100.0)
Monocytes Absolute: 0.6 10*3/uL (ref 0.1–1.0)
Monocytes Relative: 10 %
Neutro Abs: 4.1 10*3/uL (ref 1.7–7.7)
Neutrophils Relative %: 68 %
Platelet Count: 278 10*3/uL (ref 150–400)
RBC: 3.27 MIL/uL — ABNORMAL LOW (ref 3.87–5.11)
RDW: 14.5 % (ref 11.5–15.5)
WBC Count: 6.2 10*3/uL (ref 4.0–10.5)
nRBC: 0 % (ref 0.0–0.2)

## 2019-12-23 MED ORDER — DEXAMETHASONE 4 MG PO TABS
ORAL_TABLET | ORAL | Status: AC
  Filled 2019-12-23: qty 5

## 2019-12-23 MED ORDER — ACETAMINOPHEN 325 MG PO TABS
650.0000 mg | ORAL_TABLET | Freq: Once | ORAL | Status: AC
  Administered 2019-12-23: 650 mg via ORAL

## 2019-12-23 MED ORDER — DEXAMETHASONE 4 MG PO TABS
20.0000 mg | ORAL_TABLET | Freq: Once | ORAL | Status: AC
  Administered 2019-12-23: 20 mg via ORAL

## 2019-12-23 MED ORDER — DIPHENHYDRAMINE HCL 25 MG PO CAPS
50.0000 mg | ORAL_CAPSULE | Freq: Once | ORAL | Status: AC
  Administered 2019-12-23: 50 mg via ORAL

## 2019-12-23 MED ORDER — DIPHENHYDRAMINE HCL 25 MG PO CAPS
ORAL_CAPSULE | ORAL | Status: AC
  Filled 2019-12-23: qty 2

## 2019-12-23 MED ORDER — DARATUMUMAB-HYALURONIDASE-FIHJ 1800-30000 MG-UT/15ML ~~LOC~~ SOLN
1800.0000 mg | Freq: Once | SUBCUTANEOUS | Status: AC
  Administered 2019-12-23: 1800 mg via SUBCUTANEOUS
  Filled 2019-12-23: qty 15

## 2019-12-23 MED ORDER — ACETAMINOPHEN 325 MG PO TABS
ORAL_TABLET | ORAL | Status: AC
  Filled 2019-12-23: qty 2

## 2019-12-23 NOTE — Progress Notes (Signed)
Hematology and Oncology Follow Up Visit  Nicole Keith [id number] [birth year] 79 y.o. 12/23/2019 10:55 AM Nicole Keith, [name], [name], MD   Principle Diagnosis: Nicole Keith with relapsed multiple myeloma arising from MGUS diagnosed in 2019.  He was found to have IgG lambda with 30% involvement.   Past therapy:    Velcade and dexamethasone started weekly on 07/27/2018.  Therapy changed in February 2020.  Revlimid Nicole mg for 21 days out of a 28-day cycle with dexamethasone started in February 2020.  Therapy was held after 2 cycles because of severe dermatological toxicity.  Pomalyst 1 mg daily for 21 days started on May 1 of 2020.  She completed 4 cycles of therapy.  Current therapy: Ninlaro 4 mg weekly with dexamethasone 20 mg weekly for 3 weeks on and 1 week off started in December 2020.  Daratumumab was added on December 02, 2019.  Interim History:  Nicole Keith is here for return evaluation.  Since the last visit, she reports no major changes in her health.  She tolerated daratumumab without any major issues.  She does report increased fatigue and tiredness and occasional unsteadiness.  He did have a couple of falls after tripping but some of it is preceding adding daratumumab.  Overall her performance status and quality of life remains maintained.  Denies any nausea, vomiting or abdominal pain.  She denies any bone pain or pathological fractures.  Her quality of life remains maintained.  She denies any skin rashes or lesions.           Medications: Reviewed without any changes. Current Outpatient Medications  Medication Sig Dispense Refill  . acetaminophen (TYLENOL) 500 MG tablet Take 500 mg by mouth in the morning, at noon, and at bedtime.    [name] Kitchen acyclovir (ZOVIRAX) 400 MG tablet Take 1 tablet (400 mg total) by mouth daily. 60 tablet 0  . cephALEXin (KEFLEX) 500 MG capsule Take 1 capsule (500 mg total) by mouth 3 (three) times daily. (Patient not taking: Reported on  12/09/2019) 15 capsule 0  . dexamethasone (DECADRON) 4 MG tablet Take 5 tabs weekly. 60 (Patient taking differently: Take 20 mg by mouth every 7 (seven) days. ) 40 tablet 3  . diphenhydramine-acetaminophen (TYLENOL PM) Nicole-500 MG TABS tablet Take 2 tablets by mouth at bedtime as needed (for sleep).     . famotidine-calcium carbonate-magnesium hydroxide (PEPCID COMPLETE) 10-800-165 MG chewable tablet Chew 1 tablet by mouth daily as needed (for heartburn or indigestion).     [name] Kitchen ibuprofen (ADVIL) 400 MG tablet Take 400 mg by mouth every 6 (six) hours.     . ixazomib citrate (NINLARO) 4 MG capsule     . NINLARO 4 MG capsule TAKE 1 CAPSULE (4 MG) BY MOUTH WEEKLY, 3 WEEKS ON, 1 WEEK OFF, REPEAT EVERY 4 WEEKS. TAKE ON AN EMPTY STOMACH 1HR BEFORE OR 2HR AFTER MEALS (Patient taking differently: Take 4 mg by mouth See admin instructions. Take 4 mg by mouth once a week, 3 weeks on/1 week off, and repeat every 4 weeks. Take on an empty stomach- either 1 hour before OR 2 hours after (meals)) 3 capsule 1  . Polyethyl Glycol-Propyl Glycol (SYSTANE) 0.4-0.3 % SOLN Place 1 drop into both eyes in the morning and at bedtime.      No current facility-administered medications for this visit.     Allergies:  Allergies  Allergen Reactions  . Pomalyst [Pomalidomide] Rash and Other (See Comments)    ENTIRE BODY BROKE OUT IN A RASH  .  Revlimid [Lenalidomide] Rash and Other (See Comments)    WHOLE BODY BLISTERED  . Betadine [Povidone Iodine] Rash  . Flagyl [Metronidazole] Diarrhea  . Fosamax [Alendronate Sodium] Other (See Comments)    GI distress  . Bactrim [Sulfamethoxazole-Trimethoprim] Nausea Only  . Doxepin Hcl Other (See Comments)    Irritable, hallucinations  . Hydroxyzine Other (See Comments)    Hallucinations  . Hydroxyzine Hcl Other (See Comments)    Hallucinations   . Valium [Diazepam] Other (See Comments)    Reaction not recalled  . Actonel [Risedronate Sodium] Other (See Comments)    GI distress   . Boniva [Ibandronic Acid] Other (See Comments)    GI distress  . Doxycycline Rash  . Ninlaro [Ixazomib] Rash and Other (See Comments)    Mild rash  . Penicillins Rash    Ancef was OK      Physical Exam:        Blood pressure 137/70, pulse 74, temperature 98.5 F (36.9 C), temperature source Temporal, resp. rate 17, height _0  (1.676 m), weight 137 lb 9.6 oz (62.4 kg), SpO2 100 %.      ECOG: 1    General appearance: Comfortable appearing without any discomfort Head: Normocephalic without any trauma Oropharynx: Mucous membranes are moist and pink without any thrush or ulcers. Eyes: Pupils are equal and round reactive to light. Lymph nodes: No cervical, supraclavicular, inguinal or axillary lymphadenopathy.   Heart:regular rate and rhythm.  S1 and S2 without leg edema. Lung: Clear without any rhonchi or wheezes.  No dullness to percussion. Abdomin: Soft, nontender, nondistended with good bowel sounds.  No hepatosplenomegaly. Musculoskeletal: No joint deformity or effusion.  Full range of motion noted. Neurological: No deficits noted on motor, sensory and deep tendon reflex exam. Skin: No petechial rash or dryness.  Appeared moist.                 Lab Results: Lab Results  Component Value Date   WBC 6.5 12/16/2019   HGB 10.0 (L) 12/16/2019   HCT 30.3 (L) 12/16/2019   MCV 95.9 12/16/2019   PLT 340 12/16/2019     Chemistry      Component Value Date/Time   NA 128 (L) 12/16/2019 1111   NA 135 (L) 06/11/2017 0753   K 4.1 12/16/2019 1111   K 4.0 06/11/2017 0753   CL 98 12/16/2019 1111   CO2 Nicole 12/16/2019 1111   CO2 26 06/11/2017 0753   BUN 10 12/16/2019 1111   BUN 12.8 06/11/2017 0753   CREATININE 0.72 12/16/2019 1111   CREATININE 0.8 06/11/2017 0753      Component Value Date/Time   CALCIUM 8.3 (L) 12/16/2019 1111   CALCIUM 8.8 06/11/2017 0753   ALKPHOS 72 12/16/2019 1111   ALKPHOS 72 06/11/2017 0753   AST 19 12/16/2019 1111   AST 21  06/11/2017 0753   ALT 22 12/16/2019 1111   ALT 13 06/11/2017 0753   BILITOT 0.3 12/16/2019 1111   BILITOT 0.35 06/11/2017 0753     Results for Nicole Keith (MRN [id number]) as of 12/23/2019 10:32  Ref. Range 11/15/2019 10:37 12/16/2019 11:11  M Protein SerPl Elph-Mcnc Latest Ref Range: Not Observed g/dL 4.6 (H) 3.8 (H)  IFE 1 Unknown Comment (A) Comment (A)  Globulin, Total Latest Ref Range: 2.2 - 3.9 g/dL 6.7 (H) 6.0 (H)  B-Globulin SerPl Elph-Mcnc Latest Ref Range: 0.7 - 1.3 g/dL 1.0 0.9  IgG (Immunoglobin G), Serum Latest Ref Range: 586 - 1,602 mg/dL 6,029 (H) 5,196 (  H)  IgM (Immunoglobulin M), Srm Latest Ref Range: 26 - 217 mg/dL 28 28  IgA Latest Ref Range: 64 - 422 mg/dL 29 (L) 16 (L)     Impression and Plan:  79 year old Keith with:  1.  Relapsed multiple myeloma diagnosed in 2019.  She was found to have IgG lambda with 30% involvement in the bone marrow.   She is currently on daratumumab in addition to her original therapy of Ninlaro and dexamethasone.  After completing 3 cycles of daratumumab, protein studies is showing improvement with decline of her M protein down to 3.8, 4.6 and decrease in her IgG level.  Risks and benefits of continuing this treatment were reviewed at this time.  She will continue daratumumab weekly till June 2021 and she will proceed with every other week per protocol.     2.  Anemia: Hemoglobin is stable at this time which indicates improvement in her plasma cell disorder.  3. Osteoporosis and compression fracture: She will receive Zometa every 3 months.  Last injection was given on April 30.  4.  Herpes zoster reactivation: No reactivation noted at this time.   5.  Clavicular fracture: Healing better at this time with increased mobility in her arm.   6. Follow-up: Weekly for daratumumab and MD follow-up in 4 weeks.  30  minutes were spent on this encounter.  The time was dedicated to updating his status, reviewing laboratory data and  addressing complications noted current therapy and future plan of care discussion.  Zola Button, MD 5/14/202110:55 AM

## 2019-12-26 ENCOUNTER — Telehealth: Payer: Self-pay | Admitting: Oncology

## 2019-12-26 NOTE — Progress Notes (Signed)
Pharmacist Chemotherapy Monitoring - Follow Up Assessment    I verify that I have reviewed each item in the below checklist:  . Regimen for the patient is scheduled for the appropriate day and plan matches scheduled date. [name] Kitchen Appropriate non-routine labs are ordered dependent on drug ordered. . If applicable, additional medications reviewed and ordered per protocol based on lifetime cumulative doses and/or treatment regimen.   Plan for follow-up and/or issues identified: No . I-vent associated with next due treatment: No . MD and/or nursing notified: No  Nicole Keith D 12/26/2019 2:49 PM

## 2019-12-26 NOTE — Telephone Encounter (Signed)
Scheduled appt per 5/14 los.  Spoke with pt and she is aware of the scheduled appt date and time.

## 2019-12-28 ENCOUNTER — Other Ambulatory Visit: Payer: Self-pay | Admitting: Oncology

## 2019-12-29 MED FILL — NINLARO 4 MG CAPS: 4 | 28 days supply | Qty: 3 | Fill #0

## 2019-12-30 ENCOUNTER — Inpatient Hospital Stay: Payer: Medicare Other

## 2019-12-30 ENCOUNTER — Other Ambulatory Visit: Payer: Self-pay

## 2019-12-30 VITALS — BP 139/68 | HR 74 | Temp 98.0°F | Resp 16

## 2019-12-30 DIAGNOSIS — C9000 Multiple myeloma not having achieved remission: Secondary | ICD-10-CM

## 2019-12-30 DIAGNOSIS — Z5112 Encounter for antineoplastic immunotherapy: Secondary | ICD-10-CM | POA: Diagnosis not present

## 2019-12-30 LAB — CBC WITH DIFFERENTIAL (CANCER CENTER ONLY)
Abs Immature Granulocytes: 0.03 10*3/uL (ref 0.00–0.07)
Basophils Absolute: 0 10*3/uL (ref 0.0–0.1)
Basophils Relative: 0 %
Eosinophils Absolute: 0 10*3/uL (ref 0.0–0.5)
Eosinophils Relative: 0 %
HCT: 31.9 % — ABNORMAL LOW (ref 36.0–46.0)
Hemoglobin: 10.7 g/dL — ABNORMAL LOW (ref 12.0–15.0)
Immature Granulocytes: 0 %
Lymphocytes Relative: 23 %
Lymphs Abs: 2 10*3/uL (ref 0.7–4.0)
MCH: 32.2 pg (ref 26.0–34.0)
MCHC: 33.5 g/dL (ref 30.0–36.0)
MCV: 96.1 fL (ref 80.0–100.0)
Monocytes Absolute: 0.8 10*3/uL (ref 0.1–1.0)
Monocytes Relative: 9 %
Neutro Abs: 5.9 10*3/uL (ref 1.7–7.7)
Neutrophils Relative %: 68 %
Platelet Count: 343 10*3/uL (ref 150–400)
RBC: 3.32 MIL/uL — ABNORMAL LOW (ref 3.87–5.11)
RDW: 14.6 % (ref 11.5–15.5)
WBC Count: 8.8 10*3/uL (ref 4.0–10.5)
nRBC: 0 % (ref 0.0–0.2)

## 2019-12-30 LAB — CMP (CANCER CENTER ONLY)
ALT: 16 U/L (ref 0–44)
AST: 16 U/L (ref 15–41)
Albumin: 2.7 g/dL — ABNORMAL LOW (ref 3.5–5.0)
Alkaline Phosphatase: 97 U/L (ref 38–126)
Anion gap: 6 (ref 5–15)
BUN: 11 mg/dL (ref 8–23)
CO2: 26 mmol/L (ref 22–32)
Calcium: 8.2 mg/dL — ABNORMAL LOW (ref 8.9–10.3)
Chloride: 98 mmol/L (ref 98–111)
Creatinine: 0.71 mg/dL (ref 0.44–1.00)
GFR, Est AFR Am: >60 mL/min (ref >60)
GFR, Estimated: >60 mL/min (ref >60)
Glucose, Bld: 103 mg/dL — ABNORMAL HIGH (ref 70–99)
Potassium: 4.6 mmol/L (ref 3.5–5.1)
Sodium: 130 mmol/L — ABNORMAL LOW (ref 135–145)
Total Bilirubin: <0.2 mg/dL — ABNORMAL LOW (ref 0.3–1.2)
Total Protein: 8.8 g/dL — ABNORMAL HIGH (ref 6.5–8.1)

## 2019-12-30 MED ORDER — DEXAMETHASONE 4 MG PO TABS
ORAL_TABLET | ORAL | Status: AC
  Filled 2019-12-30: qty 5

## 2019-12-30 MED ORDER — ACETAMINOPHEN 325 MG PO TABS
ORAL_TABLET | ORAL | Status: AC
  Filled 2019-12-30: qty 2

## 2019-12-30 MED ORDER — DEXAMETHASONE 4 MG PO TABS
20.0000 mg | ORAL_TABLET | Freq: Once | ORAL | Status: AC
  Administered 2019-12-30: 20 mg via ORAL

## 2019-12-30 MED ORDER — DIPHENHYDRAMINE HCL 25 MG PO CAPS
50.0000 mg | ORAL_CAPSULE | Freq: Once | ORAL | Status: AC
  Administered 2019-12-30: 50 mg via ORAL

## 2019-12-30 MED ORDER — ACETAMINOPHEN 325 MG PO TABS
650.0000 mg | ORAL_TABLET | Freq: Once | ORAL | Status: AC
  Administered 2019-12-30: 650 mg via ORAL

## 2019-12-30 MED ORDER — DIPHENHYDRAMINE HCL 25 MG PO CAPS
ORAL_CAPSULE | ORAL | Status: AC
  Filled 2019-12-30: qty 2

## 2019-12-30 MED ORDER — DARATUMUMAB-HYALURONIDASE-FIHJ 1800-30000 MG-UT/15ML ~~LOC~~ SOLN
1800.0000 mg | Freq: Once | SUBCUTANEOUS | Status: AC
  Administered 2019-12-30: 1800 mg via SUBCUTANEOUS
  Filled 2019-12-30: qty 15

## 2019-12-30 NOTE — Patient Instructions (Signed)
[facility] Discharge Instructions for Patients Receiving Chemotherapy  Today you received the following chemotherapy agents: daratumumab.  To help prevent nausea and vomiting after your treatment, we encourage you to take your nausea medication as directed.   If you develop nausea and vomiting that is not controlled by your nausea medication, call the clinic.   BELOW ARE SYMPTOMS THAT SHOULD BE REPORTED IMMEDIATELY:  *FEVER GREATER THAN 100.5 F  *CHILLS WITH OR WITHOUT FEVER  NAUSEA AND VOMITING THAT IS NOT CONTROLLED WITH YOUR NAUSEA MEDICATION  *UNUSUAL SHORTNESS OF BREATH  *UNUSUAL BRUISING OR BLEEDING  TENDERNESS IN MOUTH AND THROAT WITH OR WITHOUT PRESENCE OF ULCERS  *URINARY PROBLEMS  *BOWEL PROBLEMS  UNUSUAL RASH Items with * indicate a potential emergency and should be followed up as soon as possible.  Feel free to call the clinic should you have any questions or concerns. The clinic phone number is [phone].  Please show the CHEMO ALERT CARD at check-in to the Emergency Department and triage nurse.   

## 2020-01-02 NOTE — Progress Notes (Signed)
Pharmacist Chemotherapy Monitoring - Follow Up Assessment    I verify that I have reviewed each item in the below checklist:  . Regimen for the patient is scheduled for the appropriate day and plan matches scheduled date. [name] Kitchen Appropriate non-routine labs are ordered dependent on drug ordered. . If applicable, additional medications reviewed and ordered per protocol based on lifetime cumulative doses and/or treatment regimen.   Plan for follow-up and/or issues identified: No . I-vent associated with next due treatment: No . MD and/or nursing notified: No  Nicole Keith D 01/02/2020 3:16 PM

## 2020-01-06 ENCOUNTER — Inpatient Hospital Stay: Payer: Medicare Other

## 2020-01-06 ENCOUNTER — Other Ambulatory Visit: Payer: Self-pay

## 2020-01-06 VITALS — BP 126/68 | HR 75 | Temp 98.3°F | Resp 16 | Wt 137.2 lb

## 2020-01-06 DIAGNOSIS — C9000 Multiple myeloma not having achieved remission: Secondary | ICD-10-CM

## 2020-01-06 DIAGNOSIS — Z5112 Encounter for antineoplastic immunotherapy: Secondary | ICD-10-CM | POA: Diagnosis not present

## 2020-01-06 LAB — CBC WITH DIFFERENTIAL (CANCER CENTER ONLY)
Abs Immature Granulocytes: 0.02 10*3/uL (ref 0.00–0.07)
Basophils Absolute: 0 10*3/uL (ref 0.0–0.1)
Basophils Relative: 0 %
Eosinophils Absolute: 0 10*3/uL (ref 0.0–0.5)
Eosinophils Relative: 0 %
HCT: 33.5 % — ABNORMAL LOW (ref 36.0–46.0)
Hemoglobin: 11 g/dL — ABNORMAL LOW (ref 12.0–15.0)
Immature Granulocytes: 0 %
Lymphocytes Relative: 24 %
Lymphs Abs: 1.6 10*3/uL (ref 0.7–4.0)
MCH: 32 pg (ref 26.0–34.0)
MCHC: 32.8 g/dL (ref 30.0–36.0)
MCV: 97.4 fL (ref 80.0–100.0)
Monocytes Absolute: 0.6 10*3/uL (ref 0.1–1.0)
Monocytes Relative: 9 %
Neutro Abs: 4.5 10*3/uL (ref 1.7–7.7)
Neutrophils Relative %: 67 %
Platelet Count: 465 10*3/uL — ABNORMAL HIGH (ref 150–400)
RBC: 3.44 MIL/uL — ABNORMAL LOW (ref 3.87–5.11)
RDW: 14.5 % (ref 11.5–15.5)
WBC Count: 6.7 10*3/uL (ref 4.0–10.5)
nRBC: 0 % (ref 0.0–0.2)

## 2020-01-06 LAB — CMP (CANCER CENTER ONLY)
ALT: 15 U/L (ref 0–44)
AST: 16 U/L (ref 15–41)
Albumin: 2.7 g/dL — ABNORMAL LOW (ref 3.5–5.0)
Alkaline Phosphatase: 107 U/L (ref 38–126)
Anion gap: 9 (ref 5–15)
BUN: 10 mg/dL (ref 8–23)
CO2: 22 mmol/L (ref 22–32)
Calcium: 8.5 mg/dL — ABNORMAL LOW (ref 8.9–10.3)
Chloride: 100 mmol/L (ref 98–111)
Creatinine: 0.77 mg/dL (ref 0.44–1.00)
GFR, Est AFR Am: >60 mL/min (ref >60)
GFR, Estimated: >60 mL/min (ref >60)
Glucose, Bld: 122 mg/dL — ABNORMAL HIGH (ref 70–99)
Potassium: 4.4 mmol/L (ref 3.5–5.1)
Sodium: 131 mmol/L — ABNORMAL LOW (ref 135–145)
Total Bilirubin: 0.3 mg/dL (ref 0.3–1.2)
Total Protein: 8.4 g/dL — ABNORMAL HIGH (ref 6.5–8.1)

## 2020-01-06 MED ORDER — DIPHENHYDRAMINE HCL 25 MG PO CAPS
50.0000 mg | ORAL_CAPSULE | Freq: Once | ORAL | Status: AC
  Administered 2020-01-06: 50 mg via ORAL

## 2020-01-06 MED ORDER — DEXAMETHASONE 4 MG PO TABS
ORAL_TABLET | ORAL | Status: AC
  Filled 2020-01-06: qty 5

## 2020-01-06 MED ORDER — ACETAMINOPHEN 325 MG PO TABS
650.0000 mg | ORAL_TABLET | Freq: Once | ORAL | Status: AC
  Administered 2020-01-06: 650 mg via ORAL

## 2020-01-06 MED ORDER — DIPHENHYDRAMINE HCL 25 MG PO CAPS
ORAL_CAPSULE | ORAL | Status: AC
  Filled 2020-01-06: qty 2

## 2020-01-06 MED ORDER — DEXAMETHASONE 4 MG PO TABS
20.0000 mg | ORAL_TABLET | Freq: Once | ORAL | Status: AC
  Administered 2020-01-06: 20 mg via ORAL

## 2020-01-06 MED ORDER — ACETAMINOPHEN 325 MG PO TABS
ORAL_TABLET | ORAL | Status: AC
  Filled 2020-01-06: qty 2

## 2020-01-06 MED ORDER — DARATUMUMAB-HYALURONIDASE-FIHJ 1800-30000 MG-UT/15ML ~~LOC~~ SOLN
1800.0000 mg | Freq: Once | SUBCUTANEOUS | Status: AC
  Administered 2020-01-06: 1800 mg via SUBCUTANEOUS
  Filled 2020-01-06: qty 15

## 2020-01-06 NOTE — Patient Instructions (Signed)
[facility] Discharge Instructions for Patients Receiving Chemotherapy  Today you received the following chemotherapy agents: Darzalex Faspro  To help prevent nausea and vomiting after your treatment, we encourage you to take your nausea medication as directed.   If you develop nausea and vomiting that is not controlled by your nausea medication, call the clinic.   BELOW ARE SYMPTOMS THAT SHOULD BE REPORTED IMMEDIATELY:  *FEVER GREATER THAN 100.5 F  *CHILLS WITH OR WITHOUT FEVER  NAUSEA AND VOMITING THAT IS NOT CONTROLLED WITH YOUR NAUSEA MEDICATION  *UNUSUAL SHORTNESS OF BREATH  *UNUSUAL BRUISING OR BLEEDING  TENDERNESS IN MOUTH AND THROAT WITH OR WITHOUT PRESENCE OF ULCERS  *URINARY PROBLEMS  *BOWEL PROBLEMS  UNUSUAL RASH Items with * indicate a potential emergency and should be followed up as soon as possible.  Feel free to call the clinic should you have any questions or concerns. The clinic phone number is [phone].  Please show the CHEMO ALERT CARD at check-in to the Emergency Department and triage nurse.  Zoledronic Acid injection (Hypercalcemia, Oncology) What is this medicine? ZOLEDRONIC ACID (ZOE le dron ik AS id) lowers the amount of calcium loss from bone. It is used to treat too much calcium in your blood from cancer. It is also used to prevent complications of cancer that has spread to the bone. This medicine may be used for other purposes; ask your health care provider or pharmacist if you have questions. COMMON BRAND NAME(S): Zometa What should I tell my health care provider before I take this medicine? They need to know if you have any of these conditions:  aspirin-sensitive asthma  cancer, especially if you are receiving medicines used to treat cancer  dental disease or wear dentures  infection  kidney disease  receiving corticosteroids like dexamethasone or prednisone  an unusual or allergic reaction to zoledronic acid,  other medicines, foods, dyes, or preservatives  pregnant or trying to get pregnant  breast-feeding How should I use this medicine? This medicine is for infusion into a vein. It is given by a health care professional in a hospital or clinic setting. Talk to your pediatrician regarding the use of this medicine in children. Special care may be needed. Overdosage: If you think you have taken too much of this medicine contact a poison control center or emergency room at once. NOTE: This medicine is only for you. Do not share this medicine with others. What if I miss a dose? It is important not to miss your dose. Call your doctor or health care professional if you are unable to keep an appointment. What may interact with this medicine?  certain antibiotics given by injection  NSAIDs, medicines for pain and inflammation, like ibuprofen or naproxen  some diuretics like bumetanide, furosemide  teriparatide  thalidomide This list may not describe all possible interactions. Give your health care provider a list of all the medicines, herbs, non-prescription drugs, or dietary supplements you use. Also tell them if you smoke, drink alcohol, or use illegal drugs. Some items may interact with your medicine. What should I watch for while using this medicine? Visit your doctor or health care professional for regular checkups. It may be some time before you see the benefit from this medicine. Do not stop taking your medicine unless your doctor tells you to. Your doctor may order blood tests or other tests to see how you are doing. Women should inform their doctor if they wish to become pregnant or think they might be   be pregnant. There is a potential for serious side effects to an unborn child. Talk to your health care professional or pharmacist for more information. You should make sure that you get enough calcium and vitamin D while you are taking this medicine. Discuss the foods you eat and the vitamins you  take with your health care professional. Some people who take this medicine have severe bone, joint, and/or muscle pain. This medicine may also increase your risk for jaw problems or a broken thigh bone. Tell your doctor right away if you have severe pain in your jaw, bones, joints, or muscles. Tell your doctor if you have any pain that does not go away or that gets worse. Tell your dentist and dental surgeon that you are taking this medicine. You should not have major dental surgery while on this medicine. See your dentist to have a dental exam and fix any dental problems before starting this medicine. Take good care of your teeth while on this medicine. Make sure you see your dentist for regular follow-up appointments. What side effects may I notice from receiving this medicine? Side effects that you should report to your doctor or health care professional as soon as possible:  allergic reactions like skin rash, itching or hives, swelling of the face, lips, or tongue  anxiety, confusion, or depression  breathing problems  changes in vision  eye pain  feeling faint or lightheaded, falls  jaw pain, especially after dental work  mouth sores  muscle cramps, stiffness, or weakness  redness, blistering, peeling or loosening of the skin, including inside the mouth  trouble passing urine or change in the amount of urine Side effects that usually do not require medical attention (report to your doctor or health care professional if they continue or are bothersome):  bone, joint, or muscle pain  constipation  diarrhea  fever  hair loss  irritation at site where injected  loss of appetite  nausea, vomiting  stomach upset  trouble sleeping  trouble swallowing  weak or tired This list may not describe all possible side effects. Call your doctor for medical advice about side effects. You may report side effects to FDA at 1-800-FDA-1088. Where should I keep my medicine? This  drug is given in a hospital or clinic and will not be stored at home. NOTE: This sheet is a summary. It may not cover all possible information. If you have questions about this medicine, talk to your doctor, pharmacist, or health care provider.  2020 Elsevier/Gold Standard (2013-12-24 14:19:39)

## 2020-01-06 NOTE — Progress Notes (Signed)
Pharmacist Chemotherapy Monitoring - Follow Up Assessment    I verify that I have reviewed each item in the below checklist:  . Regimen for the patient is scheduled for the appropriate day and plan matches scheduled date. [name] Kitchen Appropriate non-routine labs are ordered dependent on drug ordered. . If applicable, additional medications reviewed and ordered per protocol based on lifetime cumulative doses and/or treatment regimen.   Plan for follow-up and/or issues identified: No . I-vent associated with next due treatment: No . MD and/or nursing notified: No  Evolett Somarriba D 01/06/2020 3:31 PM

## 2020-01-13 ENCOUNTER — Inpatient Hospital Stay: Payer: Medicare Other | Attending: Oncology

## 2020-01-13 ENCOUNTER — Other Ambulatory Visit: Payer: Self-pay

## 2020-01-13 ENCOUNTER — Inpatient Hospital Stay: Payer: Medicare Other

## 2020-01-13 VITALS — BP 114/71 | HR 75 | Temp 98.9°F | Resp 18

## 2020-01-13 DIAGNOSIS — C9000 Multiple myeloma not having achieved remission: Secondary | ICD-10-CM | POA: Insufficient documentation

## 2020-01-13 DIAGNOSIS — Z5112 Encounter for antineoplastic immunotherapy: Secondary | ICD-10-CM | POA: Insufficient documentation

## 2020-01-13 LAB — CBC WITH DIFFERENTIAL (CANCER CENTER ONLY)
Abs Immature Granulocytes: 0.02 10*3/uL (ref 0.00–0.07)
Basophils Absolute: 0 10*3/uL (ref 0.0–0.1)
Basophils Relative: 0 %
Eosinophils Absolute: 0 10*3/uL (ref 0.0–0.5)
Eosinophils Relative: 0 %
HCT: 34.9 % — ABNORMAL LOW (ref 36.0–46.0)
Hemoglobin: 11.6 g/dL — ABNORMAL LOW (ref 12.0–15.0)
Immature Granulocytes: 0 %
Lymphocytes Relative: 27 %
Lymphs Abs: 2.2 10*3/uL (ref 0.7–4.0)
MCH: 32.3 pg (ref 26.0–34.0)
MCHC: 33.2 g/dL (ref 30.0–36.0)
MCV: 97.2 fL (ref 80.0–100.0)
Monocytes Absolute: 0.7 10*3/uL (ref 0.1–1.0)
Monocytes Relative: 9 %
Neutro Abs: 5.1 10*3/uL (ref 1.7–7.7)
Neutrophils Relative %: 64 %
Platelet Count: 441 10*3/uL — ABNORMAL HIGH (ref 150–400)
RBC: 3.59 MIL/uL — ABNORMAL LOW (ref 3.87–5.11)
RDW: 14.4 % (ref 11.5–15.5)
WBC Count: 8.1 10*3/uL (ref 4.0–10.5)
nRBC: 0 % (ref 0.0–0.2)

## 2020-01-13 LAB — CMP (CANCER CENTER ONLY)
ALT: 17 U/L (ref 0–44)
AST: 16 U/L (ref 15–41)
Albumin: 2.9 g/dL — ABNORMAL LOW (ref 3.5–5.0)
Alkaline Phosphatase: 118 U/L (ref 38–126)
Anion gap: 7 (ref 5–15)
BUN: 9 mg/dL (ref 8–23)
CO2: 23 mmol/L (ref 22–32)
Calcium: 8.5 mg/dL — ABNORMAL LOW (ref 8.9–10.3)
Chloride: 99 mmol/L (ref 98–111)
Creatinine: 0.74 mg/dL (ref 0.44–1.00)
GFR, Est AFR Am: >60 mL/min (ref >60)
GFR, Estimated: >60 mL/min (ref >60)
Glucose, Bld: 134 mg/dL — ABNORMAL HIGH (ref 70–99)
Potassium: 4.3 mmol/L (ref 3.5–5.1)
Sodium: 129 mmol/L — ABNORMAL LOW (ref 135–145)
Total Bilirubin: 0.3 mg/dL (ref 0.3–1.2)
Total Protein: 8.5 g/dL — ABNORMAL HIGH (ref 6.5–8.1)

## 2020-01-13 MED ORDER — DIPHENHYDRAMINE HCL 25 MG PO CAPS
50.0000 mg | ORAL_CAPSULE | Freq: Once | ORAL | Status: AC
  Administered 2020-01-13: 50 mg via ORAL

## 2020-01-13 MED ORDER — DARATUMUMAB-HYALURONIDASE-FIHJ 1800-30000 MG-UT/15ML ~~LOC~~ SOLN
1800.0000 mg | Freq: Once | SUBCUTANEOUS | Status: AC
  Administered 2020-01-13: 1800 mg via SUBCUTANEOUS
  Filled 2020-01-13: qty 15

## 2020-01-13 MED ORDER — ACETAMINOPHEN 325 MG PO TABS
650.0000 mg | ORAL_TABLET | Freq: Once | ORAL | Status: AC
  Administered 2020-01-13: 650 mg via ORAL

## 2020-01-13 MED ORDER — DEXAMETHASONE 4 MG PO TABS
20.0000 mg | ORAL_TABLET | Freq: Once | ORAL | Status: AC
  Administered 2020-01-13: 20 mg via ORAL

## 2020-01-13 MED ORDER — DIPHENHYDRAMINE HCL 25 MG PO CAPS
ORAL_CAPSULE | ORAL | Status: AC
  Filled 2020-01-13: qty 2

## 2020-01-13 MED ORDER — DEXAMETHASONE 4 MG PO TABS
ORAL_TABLET | ORAL | Status: AC
  Filled 2020-01-13: qty 5

## 2020-01-13 MED ORDER — ACETAMINOPHEN 325 MG PO TABS
ORAL_TABLET | ORAL | Status: AC
  Filled 2020-01-13: qty 2

## 2020-01-13 NOTE — Patient Instructions (Signed)
[facility] Discharge Instructions for Patients Receiving Chemotherapy  Today you received the following chemotherapy agents: Darzalex  To help prevent nausea and vomiting after your treatment, we encourage you to take your nausea medication as directed.    If you develop nausea and vomiting that is not controlled by your nausea medication, call the clinic.   BELOW ARE SYMPTOMS THAT SHOULD BE REPORTED IMMEDIATELY:  *FEVER GREATER THAN 100.5 F  *CHILLS WITH OR WITHOUT FEVER  NAUSEA AND VOMITING THAT IS NOT CONTROLLED WITH YOUR NAUSEA MEDICATION  *UNUSUAL SHORTNESS OF BREATH  *UNUSUAL BRUISING OR BLEEDING  TENDERNESS IN MOUTH AND THROAT WITH OR WITHOUT PRESENCE OF ULCERS  *URINARY PROBLEMS  *BOWEL PROBLEMS  UNUSUAL RASH Items with * indicate a potential emergency and should be followed up as soon as possible.  Feel free to call the clinic should you have any questions or concerns. The clinic phone number is [phone].  Please show the CHEMO ALERT CARD at check-in to the Emergency Department and triage nurse.   

## 2020-01-16 LAB — KAPPA/LAMBDA LIGHT CHAINS
Kappa free light chain: 4.7 mg/L (ref 3.3–19.4)
Kappa, lambda light chain ratio: 0.1 — ABNORMAL LOW (ref 0.26–1.65)
Lambda free light chains: 46.3 mg/L — ABNORMAL HIGH (ref 5.7–26.3)

## 2020-01-17 LAB — MULTIPLE MYELOMA PANEL, SERUM
Albumin SerPl Elph-Mcnc: 3 g/dL (ref 2.9–4.4)
Albumin/Glob SerPl: 0.7 (ref 0.7–1.7)
Alpha 1: 0.3 g/dL (ref 0.0–0.4)
Alpha2 Glob SerPl Elph-Mcnc: 0.9 g/dL (ref 0.4–1.0)
B-Globulin SerPl Elph-Mcnc: 0.9 g/dL (ref 0.7–1.3)
Gamma Glob SerPl Elph-Mcnc: 2.9 g/dL — ABNORMAL HIGH (ref 0.4–1.8)
Globulin, Total: 4.9 g/dL — ABNORMAL HIGH (ref 2.2–3.9)
IgA: 15 mg/dL — ABNORMAL LOW (ref 64–422)
IgG (Immunoglobin G), Serum: 3700 mg/dL — ABNORMAL HIGH (ref 586–1602)
IgM (Immunoglobulin M), Srm: 21 mg/dL — ABNORMAL LOW (ref 26–217)
M Protein SerPl Elph-Mcnc: 2.8 g/dL — ABNORMAL HIGH
Total Protein ELP: 7.9 g/dL (ref 6.0–8.5)

## 2020-01-20 ENCOUNTER — Inpatient Hospital Stay: Payer: Medicare Other | Admitting: Oncology

## 2020-01-20 ENCOUNTER — Inpatient Hospital Stay: Payer: Medicare Other

## 2020-01-20 ENCOUNTER — Other Ambulatory Visit: Payer: Self-pay

## 2020-01-20 VITALS — BP 131/71 | HR 80 | Temp 99.0°F | Resp 19 | Ht 66.0 in | Wt 132.2 lb

## 2020-01-20 DIAGNOSIS — C9000 Multiple myeloma not having achieved remission: Secondary | ICD-10-CM

## 2020-01-20 DIAGNOSIS — Z5112 Encounter for antineoplastic immunotherapy: Secondary | ICD-10-CM | POA: Diagnosis not present

## 2020-01-20 LAB — CBC WITH DIFFERENTIAL (CANCER CENTER ONLY)
Abs Immature Granulocytes: 0.02 10*3/uL (ref 0.00–0.07)
Basophils Absolute: 0 10*3/uL (ref 0.0–0.1)
Basophils Relative: 1 %
Eosinophils Absolute: 0 10*3/uL (ref 0.0–0.5)
Eosinophils Relative: 0 %
HCT: 36.2 % (ref 36.0–46.0)
Hemoglobin: 11.8 g/dL — ABNORMAL LOW (ref 12.0–15.0)
Immature Granulocytes: 0 %
Lymphocytes Relative: 28 %
Lymphs Abs: 2.4 10*3/uL (ref 0.7–4.0)
MCH: 31.8 pg (ref 26.0–34.0)
MCHC: 32.6 g/dL (ref 30.0–36.0)
MCV: 97.6 fL (ref 80.0–100.0)
Monocytes Absolute: 0.9 10*3/uL (ref 0.1–1.0)
Monocytes Relative: 10 %
Neutro Abs: 5.2 10*3/uL (ref 1.7–7.7)
Neutrophils Relative %: 61 %
Platelet Count: 373 10*3/uL (ref 150–400)
RBC: 3.71 MIL/uL — ABNORMAL LOW (ref 3.87–5.11)
RDW: 14.3 % (ref 11.5–15.5)
WBC Count: 8.6 10*3/uL (ref 4.0–10.5)
nRBC: 0 % (ref 0.0–0.2)

## 2020-01-20 LAB — CMP (CANCER CENTER ONLY)
ALT: 16 U/L (ref 0–44)
AST: 16 U/L (ref 15–41)
Albumin: 3 g/dL — ABNORMAL LOW (ref 3.5–5.0)
Alkaline Phosphatase: 126 U/L (ref 38–126)
Anion gap: 8 (ref 5–15)
BUN: 12 mg/dL (ref 8–23)
CO2: 24 mmol/L (ref 22–32)
Calcium: 8.6 mg/dL — ABNORMAL LOW (ref 8.9–10.3)
Chloride: 102 mmol/L (ref 98–111)
Creatinine: 0.74 mg/dL (ref 0.44–1.00)
GFR, Est AFR Am: >60 mL/min (ref >60)
GFR, Estimated: >60 mL/min (ref >60)
Glucose, Bld: 119 mg/dL — ABNORMAL HIGH (ref 70–99)
Potassium: 3.9 mmol/L (ref 3.5–5.1)
Sodium: 134 mmol/L — ABNORMAL LOW (ref 135–145)
Total Bilirubin: 0.3 mg/dL (ref 0.3–1.2)
Total Protein: 8.4 g/dL — ABNORMAL HIGH (ref 6.5–8.1)

## 2020-01-20 MED ORDER — DEXAMETHASONE 4 MG PO TABS
20.0000 mg | ORAL_TABLET | Freq: Once | ORAL | Status: AC
  Administered 2020-01-20: 20 mg via ORAL

## 2020-01-20 MED ORDER — DEXAMETHASONE 4 MG PO TABS
ORAL_TABLET | ORAL | Status: AC
  Filled 2020-01-20: qty 1

## 2020-01-20 MED ORDER — DIPHENHYDRAMINE HCL 25 MG PO CAPS
50.0000 mg | ORAL_CAPSULE | Freq: Once | ORAL | Status: AC
  Administered 2020-01-20: 50 mg via ORAL

## 2020-01-20 MED ORDER — DARATUMUMAB-HYALURONIDASE-FIHJ 1800-30000 MG-UT/15ML ~~LOC~~ SOLN
1800.0000 mg | Freq: Once | SUBCUTANEOUS | Status: AC
  Administered 2020-01-20: 1800 mg via SUBCUTANEOUS
  Filled 2020-01-20: qty 15

## 2020-01-20 MED ORDER — ACETAMINOPHEN 325 MG PO TABS
650.0000 mg | ORAL_TABLET | Freq: Once | ORAL | Status: AC
  Administered 2020-01-20: 650 mg via ORAL

## 2020-01-20 MED ORDER — DEXAMETHASONE 4 MG PO TABS
ORAL_TABLET | ORAL | Status: AC
  Filled 2020-01-20: qty 5

## 2020-01-20 MED ORDER — ACETAMINOPHEN 325 MG PO TABS
ORAL_TABLET | ORAL | Status: AC
  Filled 2020-01-20: qty 2

## 2020-01-20 MED ORDER — DIPHENHYDRAMINE HCL 25 MG PO CAPS
ORAL_CAPSULE | ORAL | Status: AC
  Filled 2020-01-20: qty 2

## 2020-01-20 MED ORDER — ACETAMINOPHEN 325 MG PO TABS
ORAL_TABLET | ORAL | Status: AC
  Filled 2020-01-20: qty 1

## 2020-01-20 NOTE — Progress Notes (Signed)
Hematology and Oncology Follow Up Visit  Nicole Keith [id number] [birth year] 79 y.o. 01/20/2020 12:02 PM [name], [name], [name], MD   Principle Diagnosis: 79 year old woman with IgG lambda multiple myeloma diagnosed in 2019.  She was diagnosed with MGUS and subsequently developed worsening anemia and 30% involvement of plasma cells in the bone marrow.   Past therapy:    Velcade and dexamethasone started weekly on 07/27/2018.  Therapy changed in February 2020.  Revlimid 25 mg for 21 days out of a 28-day cycle with dexamethasone started in February 2020.  Therapy was held after 2 cycles because of severe dermatological toxicity.  Pomalyst 1 mg daily for 21 days started on May 1 of 2020.  She completed 4 cycles of therapy.  Current therapy: Ninlaro 4 mg weekly with dexamethasone 20 mg weekly for 3 weeks on and 1 week off started in December 2020.  Daratumumab was added on December 02, 2019.  She is currently receiving daratumumab every other week per protocol starting June 2021.  Interim History:  Nicole Keith returns today for a follow-up visit.  Since her last visit, she reports no major changes in her health.  She does report some fatigue and some tiredness associated with her current treatment as well as slight decline in her appetite.  Her mobility has not changed at this time has not reported any recent falls or syncope.  Her performance status remains maintained at this time.           Medications: Unchanged on review. Current Outpatient Medications  Medication Sig Dispense Refill  . acetaminophen (TYLENOL) 500 MG tablet Take 500 mg by mouth in the morning, at noon, and at bedtime.    [name] Kitchen acyclovir (ZOVIRAX) 400 MG tablet Take 1 tablet (400 mg total) by mouth daily. 60 tablet 0  . cephALEXin (KEFLEX) 500 MG capsule Take 1 capsule (500 mg total) by mouth 3 (three) times daily. (Patient not taking: Reported on 12/09/2019) 15 capsule 0  . diphenhydramine-acetaminophen  (TYLENOL PM) 25-500 MG TABS tablet Take 2 tablets by mouth at bedtime as needed (for sleep).     . famotidine-calcium carbonate-magnesium hydroxide (PEPCID COMPLETE) 10-800-165 MG chewable tablet Chew 1 tablet by mouth daily as needed (for heartburn or indigestion).     [name] Kitchen ibuprofen (ADVIL) 400 MG tablet Take 400 mg by mouth every 6 (six) hours.     . ixazomib citrate (NINLARO) 4 MG capsule     . NINLARO 4 MG capsule TAKE 1 CAPSULE (4 MG) BY MOUTH WEEKLY, 3 WEEKS ON, 1 WEEK OFF, REPEAT EVERY 4 WEEKS. TAKE ON AN EMPTY STOMACH 1HR BEFORE OR 2HR AFTER MEALS 3 capsule 1  . Polyethyl Glycol-Propyl Glycol (SYSTANE) 0.4-0.3 % SOLN Place 1 drop into both eyes in the morning and at bedtime.      No current facility-administered medications for this visit.     Allergies:  Allergies  Allergen Reactions  . Pomalyst [Pomalidomide] Rash and Other (See Comments)    ENTIRE BODY BROKE OUT IN A RASH  . Revlimid [Lenalidomide] Rash and Other (See Comments)    WHOLE BODY BLISTERED  . Betadine [Povidone Iodine] Rash  . Flagyl [Metronidazole] Diarrhea  . Fosamax [Alendronate Sodium] Other (See Comments)    GI distress  . Bactrim [Sulfamethoxazole-Trimethoprim] Nausea Only  . Doxepin Hcl Other (See Comments)    Irritable, hallucinations  . Hydroxyzine Other (See Comments)    Hallucinations  . Hydroxyzine Hcl Other (See Comments)    Hallucinations   . Valium [  Diazepam] Other (See Comments)    Reaction not recalled  . Actonel [Risedronate Sodium] Other (See Comments)    GI distress  . Boniva [Ibandronic Acid] Other (See Comments)    GI distress  . Doxycycline Rash  . Ninlaro [Ixazomib] Rash and Other (See Comments)    Mild rash  . Penicillins Rash    Ancef was OK      Physical Exam:        Blood pressure 131/71, pulse 80, temperature 99 F (37.2 C), temperature source Temporal, resp. rate 19, height '5\' 6"'  (1.676 m), weight 132 lb 3.2 oz (60 kg), SpO2 99 %.      ECOG:  1    General appearance: Alert, awake without any distress. Head: Atraumatic without abnormalities Oropharynx: Without any thrush or ulcers. Eyes: No scleral icterus. Lymph nodes: No lymphadenopathy noted in the cervical, supraclavicular, or axillary nodes Heart:regular rate and rhythm, without any murmurs or gallops.   Lung: Clear to auscultation without any rhonchi, wheezes or dullness to percussion. Abdomin: Soft, nontender without any shifting dullness or ascites. Musculoskeletal: No clubbing or cyanosis. Neurological: No motor or sensory deficits. Skin: No rashes or lesions.                  Lab Results: Lab Results  Component Value Date   WBC 8.6 01/20/2020   HGB 11.8 (L) 01/20/2020   HCT 36.2 01/20/2020   MCV 97.6 01/20/2020   PLT 373 01/20/2020     Chemistry      Component Value Date/Time   NA 129 (L) 01/13/2020 1103   NA 135 (L) 06/11/2017 0753   K 4.3 01/13/2020 1103   K 4.0 06/11/2017 0753   CL 99 01/13/2020 1103   CO2 23 01/13/2020 1103   CO2 26 06/11/2017 0753   BUN 9 01/13/2020 1103   BUN 12.8 06/11/2017 0753   CREATININE 0.74 01/13/2020 1103   CREATININE 0.8 06/11/2017 0753      Component Value Date/Time   CALCIUM 8.5 (L) 01/13/2020 1103   CALCIUM 8.8 06/11/2017 0753   ALKPHOS 118 01/13/2020 1103   ALKPHOS 72 06/11/2017 0753   AST 16 01/13/2020 1103   AST 21 06/11/2017 0753   ALT 17 01/13/2020 1103   ALT 13 06/11/2017 0753   BILITOT 0.3 01/13/2020 1103   BILITOT 0.35 06/11/2017 0753     Results for RANIE, CHINCHILLA (MRN [id number]) as of 01/20/2020 11:47  Ref. Range 10/21/2019 10:16 11/15/2019 10:37 12/16/2019 11:11 01/13/2020 11:04  M Protein SerPl Elph-Mcnc Latest Ref Range: Not Observed g/dL 4.2 (H) 4.6 (H) 3.8 (H) 2.8 (H)  IFE 1 Unknown Comment (A) Comment (A) Comment (A) Comment (A)  Globulin, Total Latest Ref Range: 2.2 - 3.9 g/dL 6.1 (H) 6.7 (H) 6.0 (H) 4.9 (H)  B-Globulin SerPl Elph-Mcnc Latest Ref Range: 0.7 - 1.3 g/dL 0.8 1.0  0.9 0.9  IgG (Immunoglobin G), Serum Latest Ref Range: 586 - 1,602 mg/dL 5,829 (H) 6,029 (H) 5,196 (H) 3,700 (H)  IgM (Immunoglobulin M), Srm Latest Ref Range: 26 - 217 mg/dL 32 '28 28 21 ' (L)  IgA Latest Ref Range: 64 - 422 mg/dL 32 (L) 29 (L) 16 (L) 15 (L)      Impression and Plan:  79 year old woman with:  1.  IgG lambda multiple myeloma diagnosed in 2019.     She continues to tolerate the current therapy with daratumumab, Ninlaro with dexamethasone.  Protein studies on 01/13/2020 were personally reviewed and showed improvement in her IgG level  but declined by 50%.  Her M spike is also declined currently at 2.8 g/dL.  Risks and benefits of continuing this treatment moving forward were reviewed and at this time we elected to continue but will discontinue dexamethasone based on her preference.  We will continue to monitor counts closely and will continue daratumumab every other week through October 2021.  At that point I will be switched to a monthly basis.    2.  Anemia: Related to plasma cell disorder with hemoglobin improving at this time.  3. Osteoporosis and compression fracture: She will continue to receive Zometa every 3 months.  Next infusion will be given in July 2021.  4.  Herpes zoster reactivation: I recommended continuing acyclovir for the time being.  No reactivation noted.   5.  Clavicular fracture: Improving although she still has some pain related to that.   6. Follow-up: She will receive daratumumab on 01/27/2020.  She will have MD follow-up and daratumumab on July 2.  30  minutes were dedicated to this visit.  The time was spent on updating her disease status, treatment options and addressing complications related to therapy.  Zola Button, MD 6/11/202112:02 PM

## 2020-01-20 NOTE — Patient Instructions (Signed)
[facility] Discharge Instructions for Patients Receiving Chemotherapy  Today you received the following chemotherapy agents: Darzalex Faspro  To help prevent nausea and vomiting after your treatment, we encourage you to take your nausea medication  as prescribed.    If you develop nausea and vomiting that is not controlled by your nausea medication, call the clinic.   BELOW ARE SYMPTOMS THAT SHOULD BE REPORTED IMMEDIATELY:  *FEVER GREATER THAN 100.5 F  *CHILLS WITH OR WITHOUT FEVER  NAUSEA AND VOMITING THAT IS NOT CONTROLLED WITH YOUR NAUSEA MEDICATION  *UNUSUAL SHORTNESS OF BREATH  *UNUSUAL BRUISING OR BLEEDING  TENDERNESS IN MOUTH AND THROAT WITH OR WITHOUT PRESENCE OF ULCERS  *URINARY PROBLEMS  *BOWEL PROBLEMS  UNUSUAL RASH Items with * indicate a potential emergency and should be followed up as soon as possible.  Feel free to call the clinic should you have any questions or concerns. The clinic phone number is [phone].  Please show the CHEMO ALERT CARD at check-in to the Emergency Department and triage nurse.   

## 2020-01-23 ENCOUNTER — Other Ambulatory Visit: Payer: Self-pay | Admitting: Oncology

## 2020-01-23 ENCOUNTER — Telehealth: Payer: Self-pay | Admitting: Oncology

## 2020-01-23 MED FILL — NINLARO 4 MG CAPS: 4 | 28 days supply | Qty: 3 | Fill #1

## 2020-01-23 MED FILL — ACYCLOVIR 400 MG TABLET: 400 | 60 days supply | Qty: 60 | Fill #0

## 2020-01-23 NOTE — Telephone Encounter (Signed)
Scheduled appt per 6/11 los and secure chat.  Pt will get an updated appt calendar at their next scheduled appt.

## 2020-01-27 ENCOUNTER — Inpatient Hospital Stay: Payer: Medicare Other

## 2020-01-27 ENCOUNTER — Other Ambulatory Visit: Payer: Self-pay

## 2020-01-27 VITALS — BP 125/70 | HR 77 | Temp 98.7°F | Resp 18

## 2020-01-27 DIAGNOSIS — C9000 Multiple myeloma not having achieved remission: Secondary | ICD-10-CM

## 2020-01-27 DIAGNOSIS — Z5112 Encounter for antineoplastic immunotherapy: Secondary | ICD-10-CM | POA: Diagnosis not present

## 2020-01-27 LAB — CMP (CANCER CENTER ONLY)
ALT: 17 U/L (ref 0–44)
AST: 18 U/L (ref 15–41)
Albumin: 2.9 g/dL — ABNORMAL LOW (ref 3.5–5.0)
Alkaline Phosphatase: 131 U/L — ABNORMAL HIGH (ref 38–126)
Anion gap: 7 (ref 5–15)
BUN: 7 mg/dL — ABNORMAL LOW (ref 8–23)
CO2: 25 mmol/L (ref 22–32)
Calcium: 8.2 mg/dL — ABNORMAL LOW (ref 8.9–10.3)
Chloride: 103 mmol/L (ref 98–111)
Creatinine: 0.73 mg/dL (ref 0.44–1.00)
GFR, Est AFR Am: >60 mL/min (ref >60)
GFR, Estimated: >60 mL/min (ref >60)
Glucose, Bld: 158 mg/dL — ABNORMAL HIGH (ref 70–99)
Potassium: 3.8 mmol/L (ref 3.5–5.1)
Sodium: 135 mmol/L (ref 135–145)
Total Bilirubin: 0.2 mg/dL — ABNORMAL LOW (ref 0.3–1.2)
Total Protein: 7.8 g/dL (ref 6.5–8.1)

## 2020-01-27 LAB — CBC WITH DIFFERENTIAL (CANCER CENTER ONLY)
Abs Immature Granulocytes: 0.02 10*3/uL (ref 0.00–0.07)
Basophils Absolute: 0 10*3/uL (ref 0.0–0.1)
Basophils Relative: 0 %
Eosinophils Absolute: 0 10*3/uL (ref 0.0–0.5)
Eosinophils Relative: 0 %
HCT: 37.1 % (ref 36.0–46.0)
Hemoglobin: 12 g/dL (ref 12.0–15.0)
Immature Granulocytes: 0 %
Lymphocytes Relative: 25 %
Lymphs Abs: 1.7 10*3/uL (ref 0.7–4.0)
MCH: 32.2 pg (ref 26.0–34.0)
MCHC: 32.3 g/dL (ref 30.0–36.0)
MCV: 99.5 fL (ref 80.0–100.0)
Monocytes Absolute: 0.6 10*3/uL (ref 0.1–1.0)
Monocytes Relative: 9 %
Neutro Abs: 4.4 10*3/uL (ref 1.7–7.7)
Neutrophils Relative %: 66 %
Platelet Count: 362 10*3/uL (ref 150–400)
RBC: 3.73 MIL/uL — ABNORMAL LOW (ref 3.87–5.11)
RDW: 14.5 % (ref 11.5–15.5)
WBC Count: 6.8 10*3/uL (ref 4.0–10.5)
nRBC: 0 % (ref 0.0–0.2)

## 2020-01-27 MED ORDER — DEXAMETHASONE 4 MG PO TABS
ORAL_TABLET | ORAL | Status: AC
  Filled 2020-01-27: qty 5

## 2020-01-27 MED ORDER — DIPHENHYDRAMINE HCL 25 MG PO CAPS
50.0000 mg | ORAL_CAPSULE | Freq: Once | ORAL | Status: AC
  Administered 2020-01-27: 50 mg via ORAL

## 2020-01-27 MED ORDER — ACETAMINOPHEN 325 MG PO TABS
650.0000 mg | ORAL_TABLET | Freq: Once | ORAL | Status: AC
  Administered 2020-01-27: 650 mg via ORAL

## 2020-01-27 MED ORDER — DEXAMETHASONE 4 MG PO TABS
20.0000 mg | ORAL_TABLET | Freq: Once | ORAL | Status: AC
  Administered 2020-01-27: 20 mg via ORAL

## 2020-01-27 MED ORDER — DARATUMUMAB-HYALURONIDASE-FIHJ 1800-30000 MG-UT/15ML ~~LOC~~ SOLN
1800.0000 mg | Freq: Once | SUBCUTANEOUS | Status: AC
  Administered 2020-01-27: 1800 mg via SUBCUTANEOUS
  Filled 2020-01-27: qty 15

## 2020-01-27 MED ORDER — DIPHENHYDRAMINE HCL 25 MG PO CAPS
ORAL_CAPSULE | ORAL | Status: AC
  Filled 2020-01-27: qty 2

## 2020-01-27 MED ORDER — ACETAMINOPHEN 325 MG PO TABS
ORAL_TABLET | ORAL | Status: AC
  Filled 2020-01-27: qty 2

## 2020-01-27 NOTE — Progress Notes (Signed)
Pt states she did not take decadron at home this morning, decadron 20 mg po ordered per tx plan.

## 2020-01-27 NOTE — Patient Instructions (Signed)
[facility] Discharge Instructions for Patients Receiving Chemotherapy  Today you received the following chemotherapy agents: Darzalex Faspro  To help prevent nausea and vomiting after your treatment, we encourage you to take your nausea medication  as prescribed.    If you develop nausea and vomiting that is not controlled by your nausea medication, call the clinic.   BELOW ARE SYMPTOMS THAT SHOULD BE REPORTED IMMEDIATELY:  *FEVER GREATER THAN 100.5 F  *CHILLS WITH OR WITHOUT FEVER  NAUSEA AND VOMITING THAT IS NOT CONTROLLED WITH YOUR NAUSEA MEDICATION  *UNUSUAL SHORTNESS OF BREATH  *UNUSUAL BRUISING OR BLEEDING  TENDERNESS IN MOUTH AND THROAT WITH OR WITHOUT PRESENCE OF ULCERS  *URINARY PROBLEMS  *BOWEL PROBLEMS  UNUSUAL RASH Items with * indicate a potential emergency and should be followed up as soon as possible.  Feel free to call the clinic should you have any questions or concerns. The clinic phone number is [phone].  Please show the CHEMO ALERT CARD at check-in to the Emergency Department and triage nurse.   

## 2020-02-10 ENCOUNTER — Other Ambulatory Visit: Payer: Self-pay

## 2020-02-10 ENCOUNTER — Inpatient Hospital Stay: Payer: Medicare Other | Attending: Oncology

## 2020-02-10 ENCOUNTER — Inpatient Hospital Stay: Payer: Medicare Other | Admitting: Oncology

## 2020-02-10 ENCOUNTER — Inpatient Hospital Stay: Payer: Medicare Other

## 2020-02-10 VITALS — BP 128/76 | HR 81 | Temp 98.1°F | Resp 18 | Ht 66.0 in | Wt 135.5 lb

## 2020-02-10 DIAGNOSIS — Z5112 Encounter for antineoplastic immunotherapy: Secondary | ICD-10-CM | POA: Diagnosis not present

## 2020-02-10 DIAGNOSIS — C9000 Multiple myeloma not having achieved remission: Secondary | ICD-10-CM | POA: Diagnosis not present

## 2020-02-10 LAB — CMP (CANCER CENTER ONLY)
ALT: 14 U/L (ref 0–44)
AST: 20 U/L (ref 15–41)
Albumin: 3.2 g/dL — ABNORMAL LOW (ref 3.5–5.0)
Alkaline Phosphatase: 121 U/L (ref 38–126)
Anion gap: 11 (ref 5–15)
BUN: 10 mg/dL (ref 8–23)
CO2: 24 mmol/L (ref 22–32)
Calcium: 8.8 mg/dL — ABNORMAL LOW (ref 8.9–10.3)
Chloride: 103 mmol/L (ref 98–111)
Creatinine: 0.73 mg/dL (ref 0.44–1.00)
GFR, Est AFR Am: >60 mL/min (ref >60)
GFR, Estimated: >60 mL/min (ref >60)
Glucose, Bld: 120 mg/dL — ABNORMAL HIGH (ref 70–99)
Potassium: 3.8 mmol/L (ref 3.5–5.1)
Sodium: 138 mmol/L (ref 135–145)
Total Bilirubin: 0.3 mg/dL (ref 0.3–1.2)
Total Protein: 7.7 g/dL (ref 6.5–8.1)

## 2020-02-10 LAB — CBC WITH DIFFERENTIAL (CANCER CENTER ONLY)
Abs Immature Granulocytes: 0.02 10*3/uL (ref 0.00–0.07)
Basophils Absolute: 0 10*3/uL (ref 0.0–0.1)
Basophils Relative: 1 %
Eosinophils Absolute: 0 10*3/uL (ref 0.0–0.5)
Eosinophils Relative: 0 %
HCT: 38.2 % (ref 36.0–46.0)
Hemoglobin: 12.4 g/dL (ref 12.0–15.0)
Immature Granulocytes: 0 %
Lymphocytes Relative: 32 %
Lymphs Abs: 2.6 10*3/uL (ref 0.7–4.0)
MCH: 31.7 pg (ref 26.0–34.0)
MCHC: 32.5 g/dL (ref 30.0–36.0)
MCV: 97.7 fL (ref 80.0–100.0)
Monocytes Absolute: 1 10*3/uL (ref 0.1–1.0)
Monocytes Relative: 12 %
Neutro Abs: 4.5 10*3/uL (ref 1.7–7.7)
Neutrophils Relative %: 55 %
Platelet Count: 419 10*3/uL — ABNORMAL HIGH (ref 150–400)
RBC: 3.91 MIL/uL (ref 3.87–5.11)
RDW: 13.9 % (ref 11.5–15.5)
WBC Count: 8.2 10*3/uL (ref 4.0–10.5)
nRBC: 0 % (ref 0.0–0.2)

## 2020-02-10 MED ORDER — DIPHENHYDRAMINE HCL 25 MG PO CAPS
ORAL_CAPSULE | ORAL | Status: AC
  Filled 2020-02-10: qty 2

## 2020-02-10 MED ORDER — DEXAMETHASONE 4 MG PO TABS
20.0000 mg | ORAL_TABLET | Freq: Once | ORAL | Status: AC
  Administered 2020-02-10: 20 mg via ORAL

## 2020-02-10 MED ORDER — ACETAMINOPHEN 325 MG PO TABS
650.0000 mg | ORAL_TABLET | Freq: Once | ORAL | Status: AC
  Administered 2020-02-10: 650 mg via ORAL

## 2020-02-10 MED ORDER — DIPHENHYDRAMINE HCL 25 MG PO CAPS
50.0000 mg | ORAL_CAPSULE | Freq: Once | ORAL | Status: AC
  Administered 2020-02-10: 50 mg via ORAL

## 2020-02-10 MED ORDER — MAGIC MOUTHWASH
5.0000 mL | Freq: Three times a day (TID) | ORAL | 0 refills | Status: DC | PRN
Start: 2020-02-10 — End: 2022-11-20

## 2020-02-10 MED ORDER — DARATUMUMAB-HYALURONIDASE-FIHJ 1800-30000 MG-UT/15ML ~~LOC~~ SOLN
1800.0000 mg | Freq: Once | SUBCUTANEOUS | Status: AC
  Administered 2020-02-10: 1800 mg via SUBCUTANEOUS
  Filled 2020-02-10: qty 15

## 2020-02-10 MED ORDER — DEXAMETHASONE 4 MG PO TABS
ORAL_TABLET | ORAL | Status: AC
  Filled 2020-02-10: qty 5

## 2020-02-10 MED ORDER — ACETAMINOPHEN 325 MG PO TABS
ORAL_TABLET | ORAL | Status: AC
  Filled 2020-02-10: qty 2

## 2020-02-10 MED FILL — CMPD-MMW:MYLANTA,LIDO,NYST: 10 days supply | Qty: 150 | Fill #0

## 2020-02-10 NOTE — Progress Notes (Signed)
Hematology and Oncology Follow Up Visit  Nicole Keith [id number] 10-May-1941 79 y.o. 02/10/2020 11:49 AM [name], [name], [name], MD   Principle Diagnosis: 79 year old woman multiple myeloma diagnosed in 2019.  She was found to have IgG lambda with 30% involvement of plasma cells in the bone marrow arising from MGUS.   Past therapy:    Velcade and dexamethasone started weekly on 07/27/2018.  Therapy changed in February 2020.  Revlimid 25 mg for 21 days out of a 28-day cycle with dexamethasone started in February 2020.  Therapy was held after 2 cycles because of severe dermatological toxicity.  Pomalyst 1 mg daily for 21 days started on May 1 of 2020.  She completed 4 cycles of therapy.  Current therapy: Ninlaro 4 mg weekly with dexamethasone 20 mg weekly for 3 weeks on and 1 week off started in December 2020.  Daratumumab was added on December 02, 2019.  She is currently receiving daratumumab every other week per starting June 2021.  Interim History:  Nicole Keith is here for return evaluation.  The last visit, she reports no major changes in her health.  She has tolerated the combination of daratumumab every other week with Ninlaro without any new complaints.  She has reported mouth pain and tongue ulcer that has improved in the last few days.  She denies any nausea, vomiting or abdominal pain.  She denies any worsening neuropathy or excessive fatigue.  Performance status quality of life continues to be excellent.           Medications: Updated on review. Current Outpatient Medications  Medication Sig Dispense Refill   acetaminophen (TYLENOL) 500 MG tablet Take 500 mg by mouth in the morning, at noon, and at bedtime.     acyclovir (ZOVIRAX) 400 MG tablet TAKE 1 TABLET BY MOUTH ONCE DAILY 60 tablet 0   cephALEXin (KEFLEX) 500 MG capsule Take 1 capsule (500 mg total) by mouth 3 (three) times daily. (Patient not taking: Reported on 12/09/2019) 15 capsule 0    diphenhydramine-acetaminophen (TYLENOL PM) 25-500 MG TABS tablet Take 2 tablets by mouth at bedtime as needed (for sleep).      famotidine-calcium carbonate-magnesium hydroxide (PEPCID COMPLETE) 10-800-165 MG chewable tablet Chew 1 tablet by mouth daily as needed (for heartburn or indigestion).      ibuprofen (ADVIL) 400 MG tablet Take 400 mg by mouth every 6 (six) hours.      ixazomib citrate (NINLARO) 4 MG capsule      NINLARO 4 MG capsule TAKE 1 CAPSULE (4 MG) BY MOUTH WEEKLY, 3 WEEKS ON, 1 WEEK OFF, REPEAT EVERY 4 WEEKS. TAKE ON AN EMPTY STOMACH 1HR BEFORE OR 2HR AFTER MEALS 3 capsule 1   Polyethyl Glycol-Propyl Glycol (SYSTANE) 0.4-0.3 % SOLN Place 1 drop into both eyes in the morning and at bedtime.      No current facility-administered medications for this visit.     Allergies:  Allergies  Allergen Reactions   Pomalyst [Pomalidomide] Rash and Other (See Comments)    ENTIRE BODY BROKE OUT IN A RASH   Revlimid [Lenalidomide] Rash and Other (See Comments)    WHOLE BODY BLISTERED   Betadine [Povidone Iodine] Rash   Flagyl [Metronidazole] Diarrhea   Fosamax [Alendronate Sodium] Other (See Comments)    GI distress   Bactrim [Sulfamethoxazole-Trimethoprim] Nausea Only   Clindamycin/Lincomycin     Pt can't remember reaction type.   Doxepin Hcl Other (See Comments)    Irritable, hallucinations   Hydrocodone-Acetaminophen  Restlessness   Hydroxyzine Other (See Comments)    Hallucinations   Hydroxyzine Hcl Other (See Comments)    Hallucinations    Tramadol Hcl    Valium [Diazepam] Other (See Comments)    Reaction not recalled   Actonel [Risedronate Sodium] Other (See Comments)    GI distress   Boniva [Ibandronic Acid] Other (See Comments)    GI distress   Doxycycline Rash   Penicillins Rash    Ancef was OK      Physical Exam:        Blood pressure 128/76, pulse 81, temperature 98.1 F (36.7 C), resp. rate 18, height '5\' 6"'  (1.676 m), weight  135 lb 8 oz (61.5 kg), SpO2 98 %.      ECOG: 1     General appearance: Comfortable appearing without any discomfort Head: Normocephalic without any trauma Oropharynx: Mild erythema noted at the left side of her tongue well as right cheek. Eyes: Pupils are equal and round reactive to light. Lymph nodes: No cervical, supraclavicular, inguinal or axillary lymphadenopathy.   Heart:regular rate and rhythm.  S1 and S2 without leg edema. Lung: Clear without any rhonchi or wheezes.  No dullness to percussion. Abdomin: Soft, nontender, nondistended with good bowel sounds.  No hepatosplenomegaly. Musculoskeletal: No joint deformity or effusion.  Full range of motion noted. Neurological: No deficits noted on motor, sensory and deep tendon reflex exam. Skin: No petechial rash or dryness.  Appeared moist.                     Lab Results: Lab Results  Component Value Date   WBC 8.2 02/10/2020   HGB 12.4 02/10/2020   HCT 38.2 02/10/2020   MCV 97.7 02/10/2020   PLT 419 (H) 02/10/2020     Chemistry      Component Value Date/Time   NA 135 01/27/2020 0924   NA 135 (L) 06/11/2017 0753   K 3.8 01/27/2020 0924   K 4.0 06/11/2017 0753   CL 103 01/27/2020 0924   CO2 25 01/27/2020 0924   CO2 26 06/11/2017 0753   BUN 7 (L) 01/27/2020 0924   BUN 12.8 06/11/2017 0753   CREATININE 0.73 01/27/2020 0924   CREATININE 0.8 06/11/2017 0753      Component Value Date/Time   CALCIUM 8.2 (L) 01/27/2020 0924   CALCIUM 8.8 06/11/2017 0753   ALKPHOS 131 (H) 01/27/2020 0924   ALKPHOS 72 06/11/2017 0753   AST 18 01/27/2020 0924   AST 21 06/11/2017 0753   ALT 17 01/27/2020 0924   ALT 13 06/11/2017 0753   BILITOT 0.2 (L) 01/27/2020 0924   BILITOT 0.35 06/11/2017 0753      Results for ROBECCA, FULGHAM (MRN [id number]) as of 02/10/2020 11:30  Ref. Range 11/15/2019 10:37 12/16/2019 11:11 01/13/2020 11:04  M Protein SerPl Elph-Mcnc Latest Ref Range: Not Observed g/dL 4.6 (H) 3.8 (H) 2.8 (H)   IFE 1 Unknown Comment (A) Comment (A) Comment (A)  Globulin, Total Latest Ref Range: 2.2 - 3.9 g/dL 6.7 (H) 6.0 (H) 4.9 (H)  B-Globulin SerPl Elph-Mcnc Latest Ref Range: 0.7 - 1.3 g/dL 1.0 0.9 0.9  IgG (Immunoglobin G), Serum Latest Ref Range: 586 - 1,602 mg/dL 6,029 (H) 5,196 (H) 3,700 (H)  IgM (Immunoglobulin M), Srm Latest Ref Range: 26 - 217 mg/dL '28 28 21 ' (L)  IgA Latest Ref Range: 64 - 422 mg/dL 29 (L) 16 (L) 15 (L)      Impression and Plan:  79 year old woman with:  1.  Multiple myeloma diagnosed in 2019.  She was found to have IgG lambda arising from MGUS.   The natural course of her disease were reviewed laboratory data on January 13, 2020 were discussed.  She continues to have excellent response to therapy with decline in her M spike by 40% as well as IgG level of close to 50%.  Risks and benefits of continuing this treatment including daratumumab per protocol as well as Ninlaro.  He is agreeable to proceed at this time.    2.  Anemia: Resolved with continuous improvement in her hemoglobin after the treatment of her plasma cell disorder.  3. Osteoporosis and compression fracture: She will receive Zometa on July 16 and repeated in 3 months.  4.  Herpes zoster reactivation: No reactivation noted.  She will continue to be on acyclovir.   5.  Mouth pain: Likely related to Ninlaro.  Magic mouthwash will be prescribed and potential dose reduction will be considered.   6. Follow-up: In 2 weeks for her next treatment.   30  minutes were dedicated to this encounter.  The time was spent on reviewing her disease status, discussing treatment options, reviewing laboratory data and addressing complications noted to therapy.  Zola Button, MD 7/2/202111:49 AM

## 2020-02-10 NOTE — Patient Instructions (Signed)
[facility] Discharge Instructions for Patients Receiving Chemotherapy  Today you received the following chemotherapy agents: Darzalex Faspro  To help prevent nausea and vomiting after your treatment, we encourage you to take your nausea medication as directed.    If you develop nausea and vomiting that is not controlled by your nausea medication, call the clinic.   BELOW ARE SYMPTOMS THAT SHOULD BE REPORTED IMMEDIATELY:  *FEVER GREATER THAN 100.5 F  *CHILLS WITH OR WITHOUT FEVER  NAUSEA AND VOMITING THAT IS NOT CONTROLLED WITH YOUR NAUSEA MEDICATION  *UNUSUAL SHORTNESS OF BREATH  *UNUSUAL BRUISING OR BLEEDING  TENDERNESS IN MOUTH AND THROAT WITH OR WITHOUT PRESENCE OF ULCERS  *URINARY PROBLEMS  *BOWEL PROBLEMS  UNUSUAL RASH Items with * indicate a potential emergency and should be followed up as soon as possible.  Feel free to call the clinic should you have any questions or concerns. The clinic phone number is [phone].  Please show the CHEMO ALERT CARD at check-in to the Emergency Department and triage nurse.   

## 2020-02-14 ENCOUNTER — Telehealth: Payer: Self-pay | Admitting: Oncology

## 2020-02-14 NOTE — Telephone Encounter (Signed)
Scheduled per 07/02 los, patient has been called and voicemail was left.

## 2020-02-24 ENCOUNTER — Other Ambulatory Visit: Payer: Self-pay

## 2020-02-24 ENCOUNTER — Other Ambulatory Visit: Payer: Self-pay | Admitting: Oncology

## 2020-02-24 ENCOUNTER — Inpatient Hospital Stay: Payer: Medicare Other | Admitting: Oncology

## 2020-02-24 ENCOUNTER — Inpatient Hospital Stay: Payer: Medicare Other

## 2020-02-24 VITALS — BP 125/75 | HR 83 | Temp 97.9°F | Resp 17 | Ht 66.0 in | Wt 132.7 lb

## 2020-02-24 DIAGNOSIS — C9000 Multiple myeloma not having achieved remission: Secondary | ICD-10-CM

## 2020-02-24 DIAGNOSIS — Z5112 Encounter for antineoplastic immunotherapy: Secondary | ICD-10-CM | POA: Diagnosis not present

## 2020-02-24 DIAGNOSIS — M8000XD Age-related osteoporosis with current pathological fracture, unspecified site, subsequent encounter for fracture with routine healing: Secondary | ICD-10-CM

## 2020-02-24 LAB — CMP (CANCER CENTER ONLY)
ALT: 11 U/L (ref 0–44)
AST: 15 U/L (ref 15–41)
Albumin: 3.3 g/dL — ABNORMAL LOW (ref 3.5–5.0)
Alkaline Phosphatase: 116 U/L (ref 38–126)
Anion gap: 10 (ref 5–15)
BUN: 13 mg/dL (ref 8–23)
CO2: 24 mmol/L (ref 22–32)
Calcium: 9.1 mg/dL (ref 8.9–10.3)
Chloride: 105 mmol/L (ref 98–111)
Creatinine: 0.75 mg/dL (ref 0.44–1.00)
GFR, Est AFR Am: >60 mL/min (ref >60)
GFR, Estimated: >60 mL/min (ref >60)
Glucose, Bld: 134 mg/dL — ABNORMAL HIGH (ref 70–99)
Potassium: 3.9 mmol/L (ref 3.5–5.1)
Sodium: 139 mmol/L (ref 135–145)
Total Bilirubin: 0.3 mg/dL (ref 0.3–1.2)
Total Protein: 7.5 g/dL (ref 6.5–8.1)

## 2020-02-24 LAB — CBC WITH DIFFERENTIAL (CANCER CENTER ONLY)
Abs Immature Granulocytes: 0.02 10*3/uL (ref 0.00–0.07)
Basophils Absolute: 0 10*3/uL (ref 0.0–0.1)
Basophils Relative: 0 %
Eosinophils Absolute: 0.1 10*3/uL (ref 0.0–0.5)
Eosinophils Relative: 1 %
HCT: 40.3 % (ref 36.0–46.0)
Hemoglobin: 12.9 g/dL (ref 12.0–15.0)
Immature Granulocytes: 0 %
Lymphocytes Relative: 29 %
Lymphs Abs: 2.2 10*3/uL (ref 0.7–4.0)
MCH: 30.9 pg (ref 26.0–34.0)
MCHC: 32 g/dL (ref 30.0–36.0)
MCV: 96.6 fL (ref 80.0–100.0)
Monocytes Absolute: 0.7 10*3/uL (ref 0.1–1.0)
Monocytes Relative: 9 %
Neutro Abs: 4.8 10*3/uL (ref 1.7–7.7)
Neutrophils Relative %: 61 %
Platelet Count: 424 10*3/uL — ABNORMAL HIGH (ref 150–400)
RBC: 4.17 MIL/uL (ref 3.87–5.11)
RDW: 13.2 % (ref 11.5–15.5)
WBC Count: 7.8 10*3/uL (ref 4.0–10.5)
nRBC: 0 % (ref 0.0–0.2)

## 2020-02-24 MED ORDER — SODIUM CHLORIDE 0.9 % IV SOLN
Freq: Once | INTRAVENOUS | Status: AC
  Filled 2020-02-24: qty 250

## 2020-02-24 MED ORDER — ACETAMINOPHEN 325 MG PO TABS
ORAL_TABLET | ORAL | Status: AC
  Filled 2020-02-24: qty 2

## 2020-02-24 MED ORDER — ACETAMINOPHEN 325 MG PO TABS
650.0000 mg | ORAL_TABLET | Freq: Once | ORAL | Status: AC
  Administered 2020-02-24: 650 mg via ORAL

## 2020-02-24 MED ORDER — DEXAMETHASONE 4 MG PO TABS
ORAL_TABLET | ORAL | Status: AC
  Filled 2020-02-24: qty 5

## 2020-02-24 MED ORDER — DIPHENHYDRAMINE HCL 25 MG PO CAPS
50.0000 mg | ORAL_CAPSULE | Freq: Once | ORAL | Status: AC
  Administered 2020-02-24: 50 mg via ORAL

## 2020-02-24 MED ORDER — DIPHENHYDRAMINE HCL 25 MG PO CAPS
ORAL_CAPSULE | ORAL | Status: AC
  Filled 2020-02-24: qty 2

## 2020-02-24 MED ORDER — DARATUMUMAB-HYALURONIDASE-FIHJ 1800-30000 MG-UT/15ML ~~LOC~~ SOLN
1800.0000 mg | Freq: Once | SUBCUTANEOUS | Status: AC
  Administered 2020-02-24: 1800 mg via SUBCUTANEOUS
  Filled 2020-02-24: qty 15

## 2020-02-24 MED ORDER — DEXAMETHASONE 4 MG PO TABS
20.0000 mg | ORAL_TABLET | Freq: Once | ORAL | Status: AC
  Administered 2020-02-24: 20 mg via ORAL

## 2020-02-24 MED ORDER — ZOLEDRONIC ACID 4 MG/100ML IV SOLN
4.0000 mg | Freq: Once | INTRAVENOUS | Status: AC
  Administered 2020-02-24: 4 mg via INTRAVENOUS

## 2020-02-24 MED ORDER — ZOLEDRONIC ACID 4 MG/100ML IV SOLN
INTRAVENOUS | Status: AC
  Filled 2020-02-24: qty 100

## 2020-02-24 NOTE — Progress Notes (Signed)
Hematology and Oncology Follow Up Visit  Nicole Keith [id number] 1940/10/22 79 y.o. 02/24/2020 8:11 AM Nicole Keith, [name], Nicole Lies, MD   Principle Diagnosis: 79 year old woman IgG lambda multiple myeloma diagnosed in 2019.  She presented initially with MGUS in 2016 progressed with 30% plasma cell bone marrow disease.    Past therapy:    Velcade and dexamethasone started weekly on 07/27/2018.  Therapy changed in February 2020.  Revlimid 25 mg for 21 days out of a 28-day cycle with dexamethasone started in February 2020.  Therapy was held after 2 cycles because of severe dermatological toxicity.  Pomalyst 1 mg daily for 21 days started on May 1 of 2020.  She completed 4 cycles of therapy.  Current therapy: Ninlaro 4 mg weekly with dexamethasone 20 mg weekly for 3 weeks on and 1 week off started in December 2020.  Daratumumab was added on December 02, 2019.  She is currently receiving daratumumab every other week per starting June 2021.  Interim History:  Nicole Keith is here for a follow-up visit.  Since the last visit, she reports no major changes in her health.  Her mouth pain has improved with Magic mouthwash with no difficulty eating or swallowing.  She denies any nausea vomiting or abdominal pain.  She denies any worsening neuropathy.  Formal status quality of life continues to improve.          Medications: Reviewed without changes. Current Outpatient Medications  Medication Sig Dispense Refill  . acetaminophen (TYLENOL) 500 MG tablet Take 500 mg by mouth in the morning, at noon, and at bedtime.    [name] Kitchen acyclovir (ZOVIRAX) 400 MG tablet TAKE 1 TABLET BY MOUTH ONCE DAILY 60 tablet 0  . cephALEXin (KEFLEX) 500 MG capsule Take 1 capsule (500 mg total) by mouth 3 (three) times daily. (Patient not taking: Reported on 12/09/2019) 15 capsule 0  . diphenhydramine-acetaminophen (TYLENOL PM) 25-500 MG TABS tablet Take 2 tablets by mouth at bedtime as needed (for sleep).     .  famotidine-calcium carbonate-magnesium hydroxide (PEPCID COMPLETE) 10-800-165 MG chewable tablet Chew 1 tablet by mouth daily as needed (for heartburn or indigestion).     [name] Kitchen ibuprofen (ADVIL) 400 MG tablet Take 400 mg by mouth every 6 (six) hours.     . ixazomib citrate (NINLARO) 4 MG capsule     . magic mouthwash SOLN Take 5 mLs by mouth 3 (three) times daily as needed for mouth pain. 30 mL 0  . NINLARO 4 MG capsule TAKE 1 CAPSULE (4 MG) BY MOUTH WEEKLY, 3 WEEKS ON, 1 WEEK OFF, REPEAT EVERY 4 WEEKS. TAKE ON AN EMPTY STOMACH 1HR BEFORE OR 2HR AFTER MEALS 3 capsule 1  . Polyethyl Glycol-Propyl Glycol (SYSTANE) 0.4-0.3 % SOLN Place 1 drop into both eyes in the morning and at bedtime.      No current facility-administered medications for this visit.     Allergies:  Allergies  Allergen Reactions  . Pomalyst [Pomalidomide] Rash and Other (See Comments)    ENTIRE BODY BROKE OUT IN A RASH  . Revlimid [Lenalidomide] Rash and Other (See Comments)    WHOLE BODY BLISTERED  . Betadine [Povidone Iodine] Rash  . Flagyl [Metronidazole] Diarrhea  . Fosamax [Alendronate Sodium] Other (See Comments)    GI distress  . Bactrim [Sulfamethoxazole-Trimethoprim] Nausea Only  . Clindamycin/Lincomycin     Pt can't remember reaction type.  . Doxepin Hcl Other (See Comments)    Irritable, hallucinations  . Hydrocodone-Acetaminophen     Restlessness  .  Hydroxyzine Other (See Comments)    Hallucinations  . Hydroxyzine Hcl Other (See Comments)    Hallucinations   . Tramadol Hcl   . Valium [Diazepam] Other (See Comments)    Reaction not recalled  . Actonel [Risedronate Sodium] Other (See Comments)    GI distress  . Boniva [Ibandronic Acid] Other (See Comments)    GI distress  . Doxycycline Rash  . Penicillins Rash    Ancef was OK      Physical Exam:         Blood pressure 125/75, pulse 83, temperature 97.9 F (36.6 C), temperature source Temporal, resp. rate 17, height '5\' 6"'  (1.676 m),  weight 132 lb 11.2 oz (60.2 kg), SpO2 97 %.     ECOG: 1   General appearance: Alert, awake without any distress. Head: Atraumatic without abnormalities Oropharynx: Without any thrush or ulcers. Eyes: No scleral icterus. Lymph nodes: No lymphadenopathy noted in the cervical, supraclavicular, or axillary nodes Heart:regular rate and rhythm, without any murmurs or gallops.   Lung: Clear to auscultation without any rhonchi, wheezes or dullness to percussion. Abdomin: Soft, nontender without any shifting dullness or ascites. Musculoskeletal: No clubbing or cyanosis. Neurological: No motor or sensory deficits. Skin: No rashes or lesions.                    Lab Results: Lab Results  Component Value Date   WBC 8.2 02/10/2020   HGB 12.4 02/10/2020   HCT 38.2 02/10/2020   MCV 97.7 02/10/2020   PLT 419 (H) 02/10/2020     Chemistry      Component Value Date/Time   NA 138 02/10/2020 1107   NA 135 (L) 06/11/2017 0753   K 3.8 02/10/2020 1107   K 4.0 06/11/2017 0753   CL 103 02/10/2020 1107   CO2 24 02/10/2020 1107   CO2 26 06/11/2017 0753   BUN 10 02/10/2020 1107   BUN 12.8 06/11/2017 0753   CREATININE 0.73 02/10/2020 1107   CREATININE 0.8 06/11/2017 0753      Component Value Date/Time   CALCIUM 8.8 (L) 02/10/2020 1107   CALCIUM 8.8 06/11/2017 0753   ALKPHOS 121 02/10/2020 1107   ALKPHOS 72 06/11/2017 0753   AST 20 02/10/2020 1107   AST 21 06/11/2017 0753   ALT 14 02/10/2020 1107   ALT 13 06/11/2017 0753   BILITOT 0.3 02/10/2020 1107   BILITOT 0.35 06/11/2017 0753           Impression and Plan:  79 year old woman with:  1.  IgG lambda multiple myeloma arising from MGUS diagnosed in 2019.    She continues to tolerate current therapy with daratumumab, Ninlaro with dexamethasone which she has tolerated very well.  She had an excellent response based on her protein studies obtained in June 2021.  Risks and benefits of continuing this treatments  were discussed today.  Potential complications that include nausea, fatigue, injection related complications.  The plan is to continue with the current regimen currently on daratumumab every other week.  She is agreeable to continue at this time.    2.  Anemia: Her hemoglobin continues to improve as a treatment for plasma cell disorder.  Hemoglobin is back to normal at this time.  3. Osteoporosis and compression fracture: She is currently on Zometa and will receive it today and repeated in 3 months.  4.  Herpes zoster reactivation: Currently on acyclovir without any reactivation.   5.  Mouth pain: Magic mouthwash given to her at  this time manageable related to [facility].  This continues to improve at this time.   6. Follow-up: She will continue to follow-up every 2 weeks for daratumumab treatment.  30  minutes were spent on this visit.  The time was dedicated to reviewing her disease status, discussing laboratory data, treatment options and addressing complications related to therapy.  Zola Button, MD 7/16/20218:11 AM

## 2020-02-24 NOTE — Patient Instructions (Signed)
[place] Discharge Instructions for Patients Receiving Chemotherapy  Today you received the following chemotherapy agents: Darzalex Faspro, Zometa  To help prevent nausea and vomiting after your treatment, we encourage you to take your nausea medication as directed.   If you develop nausea and vomiting that is not controlled by your nausea medication, call the clinic.   BELOW ARE SYMPTOMS THAT SHOULD BE REPORTED IMMEDIATELY:  *FEVER GREATER THAN 100.5 F  *CHILLS WITH OR WITHOUT FEVER  NAUSEA AND VOMITING THAT IS NOT CONTROLLED WITH YOUR NAUSEA MEDICATION  *UNUSUAL SHORTNESS OF BREATH  *UNUSUAL BRUISING OR BLEEDING  TENDERNESS IN MOUTH AND THROAT WITH OR WITHOUT PRESENCE OF ULCERS  *URINARY PROBLEMS  *BOWEL PROBLEMS  UNUSUAL RASH Items with * indicate a potential emergency and should be followed up as soon as possible.  Feel free to call the clinic should you have any questions or concerns. The clinic phone number is [phone].  Please show the [place] at check-in to the Emergency Department and triage nurse.

## 2020-02-27 LAB — KAPPA/LAMBDA LIGHT CHAINS
Kappa free light chain: 4.5 mg/L (ref 3.3–19.4)
Kappa, lambda light chain ratio: 1.05 (ref 0.26–1.65)
Lambda free light chains: 4.3 mg/L — ABNORMAL LOW (ref 5.7–26.3)

## 2020-02-27 MED FILL — NINLARO 4 MG CAPS: 4 | 28 days supply | Qty: 3 | Fill #0

## 2020-02-28 ENCOUNTER — Telehealth: Payer: Self-pay | Admitting: Oncology

## 2020-02-28 LAB — MULTIPLE MYELOMA PANEL, SERUM
Albumin SerPl Elph-Mcnc: 3.1 g/dL (ref 2.9–4.4)
Albumin/Glob SerPl: 0.9 (ref 0.7–1.7)
Alpha 1: 0.3 g/dL (ref 0.0–0.4)
Alpha2 Glob SerPl Elph-Mcnc: 1 g/dL (ref 0.4–1.0)
B-Globulin SerPl Elph-Mcnc: 0.9 g/dL (ref 0.7–1.3)
Gamma Glob SerPl Elph-Mcnc: 1.5 g/dL (ref 0.4–1.8)
Globulin, Total: 3.6 g/dL (ref 2.2–3.9)
IgA: 22 mg/dL — ABNORMAL LOW (ref 64–422)
IgG (Immunoglobin G), Serum: 1617 mg/dL — ABNORMAL HIGH (ref 586–1602)
IgM (Immunoglobulin M), Srm: 24 mg/dL — ABNORMAL LOW (ref 26–217)
M Protein SerPl Elph-Mcnc: 1.3 g/dL — ABNORMAL HIGH
Total Protein ELP: 6.7 g/dL (ref 6.0–8.5)

## 2020-02-28 NOTE — Telephone Encounter (Signed)
Scheduled per 07/16 los, patient has been called and voicemail was left.

## 2020-03-09 ENCOUNTER — Inpatient Hospital Stay: Payer: Medicare Other

## 2020-03-09 ENCOUNTER — Inpatient Hospital Stay: Payer: Medicare Other | Admitting: Oncology

## 2020-03-09 ENCOUNTER — Other Ambulatory Visit: Payer: Self-pay

## 2020-03-09 VITALS — BP 135/76 | HR 85 | Temp 98.1°F | Resp 18 | Ht 66.0 in | Wt 136.4 lb

## 2020-03-09 DIAGNOSIS — C9000 Multiple myeloma not having achieved remission: Secondary | ICD-10-CM | POA: Diagnosis not present

## 2020-03-09 DIAGNOSIS — Z5112 Encounter for antineoplastic immunotherapy: Secondary | ICD-10-CM | POA: Diagnosis not present

## 2020-03-09 LAB — CBC WITH DIFFERENTIAL (CANCER CENTER ONLY)
Abs Immature Granulocytes: 0.02 10*3/uL (ref 0.00–0.07)
Basophils Absolute: 0.1 10*3/uL (ref 0.0–0.1)
Basophils Relative: 1 %
Eosinophils Absolute: 0.1 10*3/uL (ref 0.0–0.5)
Eosinophils Relative: 1 %
HCT: 40.5 % (ref 36.0–46.0)
Hemoglobin: 13.1 g/dL (ref 12.0–15.0)
Immature Granulocytes: 0 %
Lymphocytes Relative: 27 %
Lymphs Abs: 2.3 10*3/uL (ref 0.7–4.0)
MCH: 30.5 pg (ref 26.0–34.0)
MCHC: 32.3 g/dL (ref 30.0–36.0)
MCV: 94.2 fL (ref 80.0–100.0)
Monocytes Absolute: 0.6 10*3/uL (ref 0.1–1.0)
Monocytes Relative: 8 %
Neutro Abs: 5.4 10*3/uL (ref 1.7–7.7)
Neutrophils Relative %: 63 %
Platelet Count: 449 10*3/uL — ABNORMAL HIGH (ref 150–400)
RBC: 4.3 MIL/uL (ref 3.87–5.11)
RDW: 13.3 % (ref 11.5–15.5)
WBC Count: 8.4 10*3/uL (ref 4.0–10.5)
nRBC: 0 % (ref 0.0–0.2)

## 2020-03-09 LAB — CMP (CANCER CENTER ONLY)
ALT: 12 U/L (ref 0–44)
AST: 15 U/L (ref 15–41)
Albumin: 3.5 g/dL (ref 3.5–5.0)
Alkaline Phosphatase: 95 U/L (ref 38–126)
Anion gap: 9 (ref 5–15)
BUN: 11 mg/dL (ref 8–23)
CO2: 25 mmol/L (ref 22–32)
Calcium: 9.7 mg/dL (ref 8.9–10.3)
Chloride: 104 mmol/L (ref 98–111)
Creatinine: 0.77 mg/dL (ref 0.44–1.00)
GFR, Est AFR Am: >60 mL/min (ref >60)
GFR, Estimated: >60 mL/min (ref >60)
Glucose, Bld: 109 mg/dL — ABNORMAL HIGH (ref 70–99)
Potassium: 4.1 mmol/L (ref 3.5–5.1)
Sodium: 138 mmol/L (ref 135–145)
Total Bilirubin: 0.3 mg/dL (ref 0.3–1.2)
Total Protein: 7.1 g/dL (ref 6.5–8.1)

## 2020-03-09 MED ORDER — DIPHENHYDRAMINE HCL 25 MG PO CAPS
50.0000 mg | ORAL_CAPSULE | Freq: Once | ORAL | Status: AC
  Administered 2020-03-09: 50 mg via ORAL

## 2020-03-09 MED ORDER — ACETAMINOPHEN 325 MG PO TABS
ORAL_TABLET | ORAL | Status: AC
  Filled 2020-03-09: qty 2

## 2020-03-09 MED ORDER — DEXAMETHASONE 4 MG PO TABS
ORAL_TABLET | ORAL | Status: AC
  Filled 2020-03-09: qty 5

## 2020-03-09 MED ORDER — DARATUMUMAB-HYALURONIDASE-FIHJ 1800-30000 MG-UT/15ML ~~LOC~~ SOLN
1800.0000 mg | Freq: Once | SUBCUTANEOUS | Status: AC
  Administered 2020-03-09: 1800 mg via SUBCUTANEOUS
  Filled 2020-03-09: qty 15

## 2020-03-09 MED ORDER — DIPHENHYDRAMINE HCL 25 MG PO CAPS
ORAL_CAPSULE | ORAL | Status: AC
  Filled 2020-03-09: qty 2

## 2020-03-09 MED ORDER — ACETAMINOPHEN 325 MG PO TABS
650.0000 mg | ORAL_TABLET | Freq: Once | ORAL | Status: AC
  Administered 2020-03-09: 650 mg via ORAL

## 2020-03-09 MED ORDER — DEXAMETHASONE 4 MG PO TABS
20.0000 mg | ORAL_TABLET | Freq: Once | ORAL | Status: AC
  Administered 2020-03-09: 20 mg via ORAL

## 2020-03-09 NOTE — Progress Notes (Signed)
Hematology and Oncology Follow Up Visit  Nicole Keith [id number] November 06, 1940 79 y.o. 03/09/2020 10:08 AM Nicole Keith, [name], Nicole Lies, MD   Principle Diagnosis: 79 year old woman with relapsed IgG lambda multiple myeloma after initially diagnosed in 2019.   She was found to have 30% involvement of her bone marrow that is progressed from MGUS.  Past therapy:    Velcade and dexamethasone started weekly on 07/27/2018.  Therapy changed in February 2020.  Revlimid 25 mg for 21 days out of a 28-day cycle with dexamethasone started in February 2020.  Therapy was held after 2 cycles because of severe dermatological toxicity.  Pomalyst 1 mg daily for 21 days started on May 1 of 2020.  She completed 4 cycles of therapy.  Current therapy: Ninlaro 4 mg weekly with dexamethasone 20 mg weekly for 3 weeks on and 1 week off started in December 2020.  Daratumumab was added on December 02, 2019.  She is currently receiving daratumumab every other week per starting June 2021.  Interim History:  Nicole Keith returns today for a repeat evaluation.  Since the last visit, she reports no major changes in her health.  She continues to tolerate current therapy without any major complaints.  She has denies any nausea vomiting with abdominal pain.  She denies any worsening mouth pain or discomfort.  Her performance status quality of life remained reasonable and improving.  She denies any recent falls or syncope.          Medications: Updated on review. Current Outpatient Medications  Medication Sig Dispense Refill   acetaminophen (TYLENOL) 500 MG tablet Take 500 mg by mouth in the morning, at noon, and at bedtime.     acyclovir (ZOVIRAX) 400 MG tablet TAKE 1 TABLET BY MOUTH ONCE DAILY 60 tablet 0   cephALEXin (KEFLEX) 500 MG capsule Take 1 capsule (500 mg total) by mouth 3 (three) times daily. (Patient not taking: Reported on 12/09/2019) 15 capsule 0   diphenhydramine-acetaminophen (TYLENOL PM) 25-500 MG  TABS tablet Take 2 tablets by mouth at bedtime as needed (for sleep).      famotidine-calcium carbonate-magnesium hydroxide (PEPCID COMPLETE) 10-800-165 MG chewable tablet Chew 1 tablet by mouth daily as needed (for heartburn or indigestion).      ibuprofen (ADVIL) 400 MG tablet Take 400 mg by mouth every 6 (six) hours.      ixazomib citrate (NINLARO) 4 MG capsule      magic mouthwash SOLN Take 5 mLs by mouth 3 (three) times daily as needed for mouth pain. 30 mL 0   NINLARO 4 MG capsule TAKE 1 CAPSULE (4 MG) BY MOUTH WEEKLY, 3 WEEKS ON, 1 WEEK OFF, REPEAT EVERY 4 WEEKS. TAKE ON AN EMPTY STOMACH 1HR BEFORE OR 2HR AFTER MEALS 3 capsule 1   Polyethyl Glycol-Propyl Glycol (SYSTANE) 0.4-0.3 % SOLN Place 1 drop into both eyes in the morning and at bedtime.      No current facility-administered medications for this visit.     Allergies:  Allergies  Allergen Reactions   Pomalyst [Pomalidomide] Rash and Other (See Comments)    ENTIRE BODY BROKE OUT IN A RASH   Revlimid [Lenalidomide] Rash and Other (See Comments)    WHOLE BODY BLISTERED   Betadine [Povidone Iodine] Rash   Flagyl [Metronidazole] Diarrhea   Fosamax [Alendronate Sodium] Other (See Comments)    GI distress   Bactrim [Sulfamethoxazole-Trimethoprim] Nausea Only   Clindamycin/Lincomycin     Pt can't remember reaction type.   Doxepin Hcl Other (See Comments)  Irritable, hallucinations   Hydrocodone-Acetaminophen     Restlessness   Hydroxyzine Other (See Comments)    Hallucinations   Hydroxyzine Hcl Other (See Comments)    Hallucinations    Tramadol Hcl    Valium [Diazepam] Other (See Comments)    Reaction not recalled   Actonel [Risedronate Sodium] Other (See Comments)    GI distress   Boniva [Ibandronic Acid] Other (See Comments)    GI distress   Doxycycline Rash   Penicillins Rash    Ancef was OK      Physical Exam:       Blood pressure (!) 135/76, pulse 85, temperature 98.1 F (36.7  C), temperature source Temporal, resp. rate 18, height '5\' 6"'  (1.676 m), weight 136 lb 6.4 oz (61.9 kg), SpO2 97 %.        ECOG: 1    General appearance: Comfortable appearing without any discomfort Head: Normocephalic without any trauma Oropharynx: Mucous membranes are moist and pink without any thrush or ulcers. Eyes: Pupils are equal and round reactive to light. Lymph nodes: No cervical, supraclavicular, inguinal or axillary lymphadenopathy.   Heart:regular rate and rhythm.  S1 and S2 without leg edema. Lung: Clear without any rhonchi or wheezes.  No dullness to percussion. Abdomin: Soft, nontender, nondistended with good bowel sounds.  No hepatosplenomegaly. Musculoskeletal: No joint deformity or effusion.  Full range of motion noted. Neurological: No deficits noted on motor, sensory and deep tendon reflex exam. Skin: No petechial rash or dryness.  Appeared moist.  Psychiatric: Mood and affect appeared appropriate.                    Lab Results: Lab Results  Component Value Date   WBC 7.8 02/24/2020   HGB 12.9 02/24/2020   HCT 40.3 02/24/2020   MCV 96.6 02/24/2020   PLT 424 (H) 02/24/2020     Chemistry      Component Value Date/Time   NA 139 02/24/2020 0750   NA 135 (L) 06/11/2017 0753   K 3.9 02/24/2020 0750   K 4.0 06/11/2017 0753   CL 105 02/24/2020 0750   CO2 24 02/24/2020 0750   CO2 26 06/11/2017 0753   BUN 13 02/24/2020 0750   BUN 12.8 06/11/2017 0753   CREATININE 0.75 02/24/2020 0750   CREATININE 0.8 06/11/2017 0753      Component Value Date/Time   CALCIUM 9.1 02/24/2020 0750   CALCIUM 8.8 06/11/2017 0753   ALKPHOS 116 02/24/2020 0750   ALKPHOS 72 06/11/2017 0753   AST 15 02/24/2020 0750   AST 21 06/11/2017 0753   ALT 11 02/24/2020 0750   ALT 13 06/11/2017 0753   BILITOT 0.3 02/24/2020 0750   BILITOT 0.35 06/11/2017 0753      Results for Nicole Keith (MRN [id number]) as of 03/09/2020 10:10  Ref. Range 11/15/2019 10:37  12/16/2019 11:11 01/13/2020 11:04 02/24/2020 07:50  M Protein SerPl Elph-Mcnc Latest Ref Range: Not Observed g/dL 4.6 (H) 3.8 (H) 2.8 (H) 1.3 (H)  IFE 1 Unknown Comment (A) Comment (A) Comment (A) Comment (A)  Globulin, Total Latest Ref Range: 2.2 - 3.9 g/dL 6.7 (H) 6.0 (H) 4.9 (H) 3.6  B-Globulin SerPl Elph-Mcnc Latest Ref Range: 0.7 - 1.3 g/dL 1.0 0.9 0.9 0.9  IgG (Immunoglobin G), Serum Latest Ref Range: 586 - 1,602 mg/dL 6,029 (H) 5,196 (H) 3,700 (H) 1,617 (H)  IgM (Immunoglobulin M), Srm Latest Ref Range: 26 - 217 mg/dL '28 28 21 ' (L) 24 (L)  IgA Latest Ref  Range: 64 - 422 mg/dL 29 (L) 16 (L) 15 (L) 22 (L)       Impression and Plan:  79 year old woman with:  1.  Relapsed multiple myeloma with initially diagnosed in 2019.  She was found to have IgG lambda.     She continues to tolerate the current therapy with daratumumab and Ninlaro with excellent response to therapy.  Protein studies on February 24, 2020 showed an M spike that is declining currently to 1.3 g/dL and near normalization of her IgG level.   Risks and benefits of continuing this therapy for the time being were reviewed.  Potential complications that include nausea, fatigue, infusion related complications were reiterated.  She is agreeable to continue at this time.  We will consider stopping Ninlaro after she achieves a complete response and continue daratumumab maintenance.  2.  Anemia: Resolved at this time after correcting her plasma cell disorder.  3. Osteoporosis and compression fracture: She continues to be on Zometa every 3 months for osteoporosis and myeloma purposes.  4.  Herpes zoster reactivation: No reactivation noted today she continues to be on acyclovir.   5.  Mouth pain: Resolved at this time.   6. Follow-up: She will continue to follow-up every 2 weeks for her treatment and I will have MD follow-up and 4 weeks.  30  minutes were dedicated to this encounter.  The time was spent on updating her disease  status, discussing treatment options and addressing complication related to her current therapy.  Zola Button, MD 7/30/202110:08 AM

## 2020-03-09 NOTE — Patient Instructions (Signed)
[facility] Discharge Instructions for Patients Receiving Chemotherapy  Today you received the following chemotherapy agents: subcutaneous daratumumab (darzalex faspro).  To help prevent nausea and vomiting after your treatment, we encourage you to take your nausea medication as directed.   If you develop nausea and vomiting that is not controlled by your nausea medication, call the clinic.   BELOW ARE SYMPTOMS THAT SHOULD BE REPORTED IMMEDIATELY:  *FEVER GREATER THAN 100.5 F  *CHILLS WITH OR WITHOUT FEVER  NAUSEA AND VOMITING THAT IS NOT CONTROLLED WITH YOUR NAUSEA MEDICATION  *UNUSUAL SHORTNESS OF BREATH  *UNUSUAL BRUISING OR BLEEDING  TENDERNESS IN MOUTH AND THROAT WITH OR WITHOUT PRESENCE OF ULCERS  *URINARY PROBLEMS  *BOWEL PROBLEMS  UNUSUAL RASH Items with * indicate a potential emergency and should be followed up as soon as possible.  Feel free to call the clinic should you have any questions or concerns. The clinic phone number is [phone].  Please show the CHEMO ALERT CARD at check-in to the Emergency Department and triage nurse.   

## 2020-03-11 LAB — KAPPA/LAMBDA LIGHT CHAINS
Kappa free light chain: 4.5 mg/L (ref 3.3–19.4)
Kappa, lambda light chain ratio: 0.74 (ref 0.26–1.65)
Lambda free light chains: 6.1 mg/L (ref 5.7–26.3)

## 2020-03-12 ENCOUNTER — Telehealth: Payer: Self-pay | Admitting: Oncology

## 2020-03-12 NOTE — Telephone Encounter (Signed)
Scheduled per 07/30 los, patient has been called and voicemail was left. °

## 2020-03-13 LAB — MULTIPLE MYELOMA PANEL, SERUM
Albumin SerPl Elph-Mcnc: 3.2 g/dL (ref 2.9–4.4)
Albumin/Glob SerPl: 1.1 (ref 0.7–1.7)
Alpha 1: 0.2 g/dL (ref 0.0–0.4)
Alpha2 Glob SerPl Elph-Mcnc: 0.8 g/dL (ref 0.4–1.0)
B-Globulin SerPl Elph-Mcnc: 0.9 g/dL (ref 0.7–1.3)
Gamma Glob SerPl Elph-Mcnc: 1.2 g/dL (ref 0.4–1.8)
Globulin, Total: 3.2 g/dL (ref 2.2–3.9)
IgA: 25 mg/dL — ABNORMAL LOW (ref 64–422)
IgG (Immunoglobin G), Serum: 1149 mg/dL (ref 586–1602)
IgM (Immunoglobulin M), Srm: 26 mg/dL (ref 26–217)
M Protein SerPl Elph-Mcnc: 1 g/dL — ABNORMAL HIGH
Total Protein ELP: 6.4 g/dL (ref 6.0–8.5)

## 2020-03-19 ENCOUNTER — Telehealth: Payer: Self-pay | Admitting: Oncology

## 2020-03-19 NOTE — Telephone Encounter (Signed)
Called patient regarding voicemail that was left, informed patient of upcoming appointments. Left a voicemail.

## 2020-03-23 ENCOUNTER — Other Ambulatory Visit: Payer: Self-pay

## 2020-03-23 ENCOUNTER — Inpatient Hospital Stay: Payer: Medicare Other | Attending: Oncology

## 2020-03-23 ENCOUNTER — Telehealth: Payer: Self-pay

## 2020-03-23 ENCOUNTER — Inpatient Hospital Stay: Payer: Medicare Other

## 2020-03-23 VITALS — BP 139/76 | HR 81 | Temp 98.9°F | Resp 17

## 2020-03-23 DIAGNOSIS — C9000 Multiple myeloma not having achieved remission: Secondary | ICD-10-CM | POA: Diagnosis present

## 2020-03-23 DIAGNOSIS — Z5112 Encounter for antineoplastic immunotherapy: Secondary | ICD-10-CM | POA: Diagnosis not present

## 2020-03-23 LAB — CMP (CANCER CENTER ONLY)
ALT: 7 U/L (ref 0–44)
AST: 14 U/L — ABNORMAL LOW (ref 15–41)
Albumin: 3.3 g/dL — ABNORMAL LOW (ref 3.5–5.0)
Alkaline Phosphatase: 84 U/L (ref 38–126)
Anion gap: 8 (ref 5–15)
BUN: 8 mg/dL (ref 8–23)
CO2: 27 mmol/L (ref 22–32)
Calcium: 9.7 mg/dL (ref 8.9–10.3)
Chloride: 103 mmol/L (ref 98–111)
Creatinine: 0.75 mg/dL (ref 0.44–1.00)
GFR, Est AFR Am: >60 mL/min (ref >60)
GFR, Estimated: >60 mL/min (ref >60)
Glucose, Bld: 99 mg/dL (ref 70–99)
Potassium: 4.4 mmol/L (ref 3.5–5.1)
Sodium: 138 mmol/L (ref 135–145)
Total Bilirubin: 0.4 mg/dL (ref 0.3–1.2)
Total Protein: 6.7 g/dL (ref 6.5–8.1)

## 2020-03-23 LAB — CBC WITH DIFFERENTIAL (CANCER CENTER ONLY)
Abs Immature Granulocytes: 0.02 10*3/uL (ref 0.00–0.07)
Basophils Absolute: 0.1 10*3/uL (ref 0.0–0.1)
Basophils Relative: 1 %
Eosinophils Absolute: 0.1 10*3/uL (ref 0.0–0.5)
Eosinophils Relative: 1 %
HCT: 40.9 % (ref 36.0–46.0)
Hemoglobin: 13 g/dL (ref 12.0–15.0)
Immature Granulocytes: 0 %
Lymphocytes Relative: 22 %
Lymphs Abs: 1.7 10*3/uL (ref 0.7–4.0)
MCH: 30.2 pg (ref 26.0–34.0)
MCHC: 31.8 g/dL (ref 30.0–36.0)
MCV: 95.1 fL (ref 80.0–100.0)
Monocytes Absolute: 0.6 10*3/uL (ref 0.1–1.0)
Monocytes Relative: 8 %
Neutro Abs: 5.2 10*3/uL (ref 1.7–7.7)
Neutrophils Relative %: 68 %
Platelet Count: 349 10*3/uL (ref 150–400)
RBC: 4.3 MIL/uL (ref 3.87–5.11)
RDW: 13.1 % (ref 11.5–15.5)
WBC Count: 7.6 10*3/uL (ref 4.0–10.5)
nRBC: 0 % (ref 0.0–0.2)

## 2020-03-23 MED ORDER — DIPHENHYDRAMINE HCL 25 MG PO CAPS
ORAL_CAPSULE | ORAL | Status: AC
  Filled 2020-03-23: qty 2

## 2020-03-23 MED ORDER — ACETAMINOPHEN 325 MG PO TABS
ORAL_TABLET | ORAL | Status: AC
  Filled 2020-03-23: qty 2

## 2020-03-23 MED ORDER — DIPHENHYDRAMINE HCL 25 MG PO CAPS
50.0000 mg | ORAL_CAPSULE | Freq: Once | ORAL | Status: AC
  Administered 2020-03-23: 50 mg via ORAL

## 2020-03-23 MED ORDER — DEXAMETHASONE 4 MG PO TABS
ORAL_TABLET | ORAL | Status: AC
  Filled 2020-03-23: qty 5

## 2020-03-23 MED ORDER — DARATUMUMAB-HYALURONIDASE-FIHJ 1800-30000 MG-UT/15ML ~~LOC~~ SOLN
1800.0000 mg | Freq: Once | SUBCUTANEOUS | Status: AC
  Administered 2020-03-23: 1800 mg via SUBCUTANEOUS
  Filled 2020-03-23: qty 15

## 2020-03-23 MED ORDER — DEXAMETHASONE 4 MG PO TABS
20.0000 mg | ORAL_TABLET | Freq: Once | ORAL | Status: AC
  Administered 2020-03-23: 20 mg via ORAL

## 2020-03-23 MED ORDER — ACETAMINOPHEN 325 MG PO TABS
650.0000 mg | ORAL_TABLET | Freq: Once | ORAL | Status: AC
  Administered 2020-03-23: 650 mg via ORAL

## 2020-03-23 MED FILL — NINLARO 4 MG CAPS: 4 | 28 days supply | Qty: 3 | Fill #1

## 2020-03-23 NOTE — Telephone Encounter (Signed)
Per Dr Alen Blew called in refill to [facility] to refill ixazomib citrate (NINLARO)Let patient know that she can pick up refill today.

## 2020-03-23 NOTE — Patient Instructions (Signed)
[facility] Discharge Instructions for Patients Receiving Chemotherapy  Today you received the following chemotherapy agents: subcutaneous daratumumab (darzalex faspro).  To help prevent nausea and vomiting after your treatment, we encourage you to take your nausea medication as directed.   If you develop nausea and vomiting that is not controlled by your nausea medication, call the clinic.   BELOW ARE SYMPTOMS THAT SHOULD BE REPORTED IMMEDIATELY:  *FEVER GREATER THAN 100.5 F  *CHILLS WITH OR WITHOUT FEVER  NAUSEA AND VOMITING THAT IS NOT CONTROLLED WITH YOUR NAUSEA MEDICATION  *UNUSUAL SHORTNESS OF BREATH  *UNUSUAL BRUISING OR BLEEDING  TENDERNESS IN MOUTH AND THROAT WITH OR WITHOUT PRESENCE OF ULCERS  *URINARY PROBLEMS  *BOWEL PROBLEMS  UNUSUAL RASH Items with * indicate a potential emergency and should be followed up as soon as possible.  Feel free to call the clinic should you have any questions or concerns. The clinic phone number is [phone].  Please show the [facility] at check-in to the Emergency Department and triage nurse.

## 2020-04-06 ENCOUNTER — Other Ambulatory Visit: Payer: Self-pay

## 2020-04-06 ENCOUNTER — Inpatient Hospital Stay: Payer: Medicare Other

## 2020-04-06 ENCOUNTER — Inpatient Hospital Stay: Payer: Medicare Other | Admitting: Oncology

## 2020-04-06 VITALS — BP 139/78 | HR 82 | Temp 96.9°F | Resp 18 | Ht 66.0 in | Wt 136.3 lb

## 2020-04-06 DIAGNOSIS — C9000 Multiple myeloma not having achieved remission: Secondary | ICD-10-CM

## 2020-04-06 DIAGNOSIS — Z5112 Encounter for antineoplastic immunotherapy: Secondary | ICD-10-CM | POA: Diagnosis not present

## 2020-04-06 LAB — CBC WITH DIFFERENTIAL (CANCER CENTER ONLY)
Abs Immature Granulocytes: 0.01 10*3/uL (ref 0.00–0.07)
Basophils Absolute: 0.1 10*3/uL (ref 0.0–0.1)
Basophils Relative: 1 %
Eosinophils Absolute: 0.1 10*3/uL (ref 0.0–0.5)
Eosinophils Relative: 1 %
HCT: 41.6 % (ref 36.0–46.0)
Hemoglobin: 13.1 g/dL (ref 12.0–15.0)
Immature Granulocytes: 0 %
Lymphocytes Relative: 25 %
Lymphs Abs: 1.9 10*3/uL (ref 0.7–4.0)
MCH: 29.2 pg (ref 26.0–34.0)
MCHC: 31.5 g/dL (ref 30.0–36.0)
MCV: 92.9 fL (ref 80.0–100.0)
Monocytes Absolute: 0.6 10*3/uL (ref 0.1–1.0)
Monocytes Relative: 8 %
Neutro Abs: 5 10*3/uL (ref 1.7–7.7)
Neutrophils Relative %: 65 %
Platelet Count: 406 10*3/uL — ABNORMAL HIGH (ref 150–400)
RBC: 4.48 MIL/uL (ref 3.87–5.11)
RDW: 12.9 % (ref 11.5–15.5)
WBC Count: 7.7 10*3/uL (ref 4.0–10.5)
nRBC: 0 % (ref 0.0–0.2)

## 2020-04-06 LAB — CMP (CANCER CENTER ONLY)
ALT: 18 U/L (ref 0–44)
AST: 21 U/L (ref 15–41)
Albumin: 3.7 g/dL (ref 3.5–5.0)
Alkaline Phosphatase: 68 U/L (ref 38–126)
Anion gap: 9 (ref 5–15)
BUN: 10 mg/dL (ref 8–23)
CO2: 26 mmol/L (ref 22–32)
Calcium: 8.9 mg/dL (ref 8.9–10.3)
Chloride: 101 mmol/L (ref 98–111)
Creatinine: 0.74 mg/dL (ref 0.44–1.00)
GFR, Est AFR Am: >60 mL/min (ref >60)
GFR, Estimated: >60 mL/min (ref >60)
Glucose, Bld: 136 mg/dL — ABNORMAL HIGH (ref 70–99)
Potassium: 4.1 mmol/L (ref 3.5–5.1)
Sodium: 136 mmol/L (ref 135–145)
Total Bilirubin: 0.3 mg/dL (ref 0.3–1.2)
Total Protein: 6.7 g/dL (ref 6.5–8.1)

## 2020-04-06 MED ORDER — ACETAMINOPHEN 325 MG PO TABS
650.0000 mg | ORAL_TABLET | Freq: Once | ORAL | Status: AC
  Administered 2020-04-06: 650 mg via ORAL

## 2020-04-06 MED ORDER — ACETAMINOPHEN 325 MG PO TABS
ORAL_TABLET | ORAL | Status: AC
  Filled 2020-04-06: qty 2

## 2020-04-06 MED ORDER — DIPHENHYDRAMINE HCL 25 MG PO CAPS
50.0000 mg | ORAL_CAPSULE | Freq: Once | ORAL | Status: AC
  Administered 2020-04-06: 50 mg via ORAL

## 2020-04-06 MED ORDER — DEXAMETHASONE 4 MG PO TABS
ORAL_TABLET | ORAL | Status: AC
  Filled 2020-04-06: qty 5

## 2020-04-06 MED ORDER — DIPHENHYDRAMINE HCL 25 MG PO CAPS
ORAL_CAPSULE | ORAL | Status: AC
  Filled 2020-04-06: qty 2

## 2020-04-06 MED ORDER — DEXAMETHASONE 4 MG PO TABS
20.0000 mg | ORAL_TABLET | Freq: Once | ORAL | Status: AC
  Administered 2020-04-06: 20 mg via ORAL

## 2020-04-06 MED ORDER — DARATUMUMAB-HYALURONIDASE-FIHJ 1800-30000 MG-UT/15ML ~~LOC~~ SOLN
1800.0000 mg | Freq: Once | SUBCUTANEOUS | Status: AC
  Administered 2020-04-06: 1800 mg via SUBCUTANEOUS
  Filled 2020-04-06: qty 15

## 2020-04-06 NOTE — Progress Notes (Signed)
Hematology and Oncology Follow Up Visit  Nicole Keith [id number] [birth year] 79 y.o. 04/06/2020 10:57 AM Nicole Keith, [name], [name], MD   Principle Diagnosis: 79 year old woman with IgG lambda multiple myeloma diagnosed in 2019.  He was found to have MGUS and subsequently developed symptomatic myeloma with 30% involvement of her bone marrow.   Past therapy:    Velcade and dexamethasone started weekly on 07/27/2018.  Therapy changed in February 2020.  Revlimid 25 mg for 21 days out of a 28-day cycle with dexamethasone started in February 2020.  Therapy was held after 2 cycles because of severe dermatological toxicity.  Pomalyst 1 mg daily for 21 days started on May 1 of 2020.  She completed 4 cycles of therapy.  Current therapy: Ninlaro 4 mg weekly with dexamethasone 20 mg weekly for 3 weeks on and 1 week off started in December 2020.  Daratumumab was added on December 02, 2019.  She is currently receiving daratumumab every other week per starting June 2021.  Interim History:  Nicole Keith is here for a follow-up visit.  Since the last visit, she reports no major changes in her health.  She continues to tolerate therapy reasonably well without any new complaints.  She denies any nausea, vomiting or abdominal pain.  He denies any arthralgias or myalgias.  She denies any bone pain or pathological fractures.          Medications: Unchanged on review. Current Outpatient Medications  Medication Sig Dispense Refill  . acetaminophen (TYLENOL) 500 MG tablet Take 500 mg by mouth in the morning, at noon, and at bedtime.    [name] Kitchen acyclovir (ZOVIRAX) 400 MG tablet TAKE 1 TABLET BY MOUTH ONCE DAILY 60 tablet 0  . cephALEXin (KEFLEX) 500 MG capsule Take 1 capsule (500 mg total) by mouth 3 (three) times daily. (Patient not taking: Reported on 12/09/2019) 15 capsule 0  . diphenhydramine-acetaminophen (TYLENOL PM) 25-500 MG TABS tablet Take 2 tablets by mouth at bedtime as needed (for sleep).     .  famotidine-calcium carbonate-magnesium hydroxide (PEPCID COMPLETE) 10-800-165 MG chewable tablet Chew 1 tablet by mouth daily as needed (for heartburn or indigestion).     [name] Kitchen ibuprofen (ADVIL) 400 MG tablet Take 400 mg by mouth every 6 (six) hours.     . ixazomib citrate (NINLARO) 4 MG capsule     . magic mouthwash SOLN Take 5 mLs by mouth 3 (three) times daily as needed for mouth pain. 30 mL 0  . NINLARO 4 MG capsule TAKE 1 CAPSULE (4 MG) BY MOUTH WEEKLY, 3 WEEKS ON, 1 WEEK OFF, REPEAT EVERY 4 WEEKS. TAKE ON AN EMPTY STOMACH 1HR BEFORE OR 2HR AFTER MEALS 3 capsule 1  . Polyethyl Glycol-Propyl Glycol (SYSTANE) 0.4-0.3 % SOLN Place 1 drop into both eyes in the morning and at bedtime.      No current facility-administered medications for this visit.     Allergies:  Allergies  Allergen Reactions  . Pomalyst [Pomalidomide] Rash and Other (See Comments)    ENTIRE BODY BROKE OUT IN A RASH  . Revlimid [Lenalidomide] Rash and Other (See Comments)    WHOLE BODY BLISTERED  . Betadine [Povidone Iodine] Rash  . Flagyl [Metronidazole] Diarrhea  . Fosamax [Alendronate Sodium] Other (See Comments)    GI distress  . Bactrim [Sulfamethoxazole-Trimethoprim] Nausea Only  . Clindamycin/Lincomycin     Pt can't remember reaction type.  . Doxepin Hcl Other (See Comments)    Irritable, hallucinations  . Hydrocodone-Acetaminophen     Restlessness  .  Hydroxyzine Other (See Comments)    Hallucinations  . Hydroxyzine Hcl Other (See Comments)    Hallucinations   . Tramadol Hcl   . Valium [Diazepam] Other (See Comments)    Reaction not recalled  . Actonel [Risedronate Sodium] Other (See Comments)    GI distress  . Boniva [Ibandronic Acid] Other (See Comments)    GI distress  . Doxycycline Rash  . Penicillins Rash    Ancef was OK      Physical Exam:        Blood pressure 139/78, pulse 82, temperature (!) 96.9 F (36.1 C), temperature source Tympanic, resp. rate 18, height _0  (1.676 m),  weight 136 lb 4.8 oz (61.8 kg), SpO2 98 %.        ECOG: 1     General appearance: Alert, awake without any distress. Head: Atraumatic without abnormalities Oropharynx: Without any thrush or ulcers. Eyes: No scleral icterus. Lymph nodes: No lymphadenopathy noted in the cervical, supraclavicular, or axillary nodes Heart:regular rate and rhythm, without any murmurs or gallops.   Lung: Clear to auscultation without any rhonchi, wheezes or dullness to percussion. Abdomin: Soft, nontender without any shifting dullness or ascites. Musculoskeletal: No clubbing or cyanosis. Neurological: No motor or sensory deficits. Skin: No rashes or lesions.                     Lab Results: Lab Results  Component Value Date   WBC 7.6 03/23/2020   HGB 13.0 03/23/2020   HCT 40.9 03/23/2020   MCV 95.1 03/23/2020   PLT 349 03/23/2020     Chemistry      Component Value Date/Time   NA 138 03/23/2020 1021   NA 135 (L) 06/11/2017 0753   K 4.4 03/23/2020 1021   K 4.0 06/11/2017 0753   CL 103 03/23/2020 1021   CO2 27 03/23/2020 1021   CO2 26 06/11/2017 0753   BUN 8 03/23/2020 1021   BUN 12.8 06/11/2017 0753   CREATININE 0.75 03/23/2020 1021   CREATININE 0.8 06/11/2017 0753      Component Value Date/Time   CALCIUM 9.7 03/23/2020 1021   CALCIUM 8.8 06/11/2017 0753   ALKPHOS 84 03/23/2020 1021   ALKPHOS 72 06/11/2017 0753   AST 14 (L) 03/23/2020 1021   AST 21 06/11/2017 0753   ALT 7 03/23/2020 1021   ALT 13 06/11/2017 0753   BILITOT 0.4 03/23/2020 1021   BILITOT 0.35 06/11/2017 0753      Results for Nicole Keith (MRN [id number]) as of 04/06/2020 10:59  Ref. Range 02/24/2020 07:50 03/09/2020 09:51  IFE 1 Unknown Comment (A) Comment (A)  Globulin, Total Latest Ref Range: 2.2 - 3.9 g/dL 3.6 3.2  B-Globulin SerPl Elph-Mcnc Latest Ref Range: 0.7 - 1.3 g/dL 0.9 0.9  IgG (Immunoglobin G), Serum Latest Ref Range: 586 - 1,602 mg/dL 1,617 (H) 1,149  IgM (Immunoglobulin M),  Srm Latest Ref Range: 26 - 217 mg/dL 24 (L) 26  IgA Latest Ref Range: 64 - 422 mg/dL 22 (L) 25 (L)        Impression and Plan:  79 year old woman with:  1.  IgG lambda multiple myeloma with initially diagnosed in 2019.     The natural course of her disease was reviewed and the overall disease status was updated.  Protein studies obtained on March 09, 2020 were reviewed and continues to show decline in her M spike currently at 1.0 g/dL and normalization of her IgG level.  Her kappa to lambda  ratio normalized.  Risks and benefits of continuing her approach utilizing Ninlaro and daratumumab were reviewed.  Complications include infusion related issues, nausea, vomiting and neuropathy were reiterated.  He is agreeable to continue at this time.  2.  Anemia: Resolved at this time after correcting her plasma cell disorder.  3. Osteoporosis and compression fracture: She will continue to receive Zometa every 3 months.  Next injection will be in October 2021.  4.  Herpes zoster reactivation: I recommended continuing acyclovir at this time without any zoster reactivation noted.   5.  Mouth pain: Mucositis related to Ninlaro has improved at this time.  6.  Covid vaccine considerations: Risks and benefits of receiving Covid vaccination were discussed with her in detail.  Complications include allergic reaction were discussed today in detail.  After discussion she is agreeable to proceed and we will tentatively schedule her on September 20.   7. Follow-up: She will return in 2 weeks for repeat evaluation.  30  minutes were spent on this visit.  Time was dedicated to reviewing her disease status, treatment options and addressing complication related to her cancer and cancer therapy.   Zola Button, MD 8/27/202110:57 AM

## 2020-04-06 NOTE — Patient Instructions (Signed)
[facility] Discharge Instructions for Patients Receiving Chemotherapy  Today you received the following chemotherapy agents: subcutaneous daratumumab (darzalex faspro).  To help prevent nausea and vomiting after your treatment, we encourage you to take your nausea medication as directed.   If you develop nausea and vomiting that is not controlled by your nausea medication, call the clinic.   BELOW ARE SYMPTOMS THAT SHOULD BE REPORTED IMMEDIATELY:  *FEVER GREATER THAN 100.5 F  *CHILLS WITH OR WITHOUT FEVER  NAUSEA AND VOMITING THAT IS NOT CONTROLLED WITH YOUR NAUSEA MEDICATION  *UNUSUAL SHORTNESS OF BREATH  *UNUSUAL BRUISING OR BLEEDING  TENDERNESS IN MOUTH AND THROAT WITH OR WITHOUT PRESENCE OF ULCERS  *URINARY PROBLEMS  *BOWEL PROBLEMS  UNUSUAL RASH Items with * indicate a potential emergency and should be followed up as soon as possible.  Feel free to call the clinic should you have any questions or concerns. The clinic phone number is [phone].  Please show the [facility] at check-in to the Emergency Department and triage nurse.

## 2020-04-09 ENCOUNTER — Telehealth: Payer: Self-pay | Admitting: *Deleted

## 2020-04-09 MED FILL — CMPD-MMW:MYLANTA,LIDO,NYST: 10 days supply | Qty: 150 | Fill #0

## 2020-04-09 NOTE — Telephone Encounter (Signed)
Pt called requesting refill of magic mouthwash for recurring mouth irritation. Mouthwash called in to [facility] Pt called and made aware.

## 2020-04-17 ENCOUNTER — Other Ambulatory Visit: Payer: Self-pay | Admitting: Oncology

## 2020-04-19 MED FILL — NINLARO 4 MG CAPS: 4 | 28 days supply | Qty: 3 | Fill #0

## 2020-04-20 ENCOUNTER — Other Ambulatory Visit: Payer: Self-pay

## 2020-04-20 ENCOUNTER — Inpatient Hospital Stay: Payer: Medicare Other | Attending: Oncology

## 2020-04-20 ENCOUNTER — Inpatient Hospital Stay: Payer: Medicare Other

## 2020-04-20 ENCOUNTER — Inpatient Hospital Stay (HOSPITAL_BASED_OUTPATIENT_CLINIC_OR_DEPARTMENT_OTHER): Payer: Medicare Other | Admitting: Oncology

## 2020-04-20 VITALS — BP 137/81 | HR 78 | Temp 97.5°F | Resp 18 | Ht 66.0 in | Wt 136.7 lb

## 2020-04-20 DIAGNOSIS — Z5112 Encounter for antineoplastic immunotherapy: Secondary | ICD-10-CM | POA: Diagnosis not present

## 2020-04-20 DIAGNOSIS — C9000 Multiple myeloma not having achieved remission: Secondary | ICD-10-CM

## 2020-04-20 LAB — CMP (CANCER CENTER ONLY)
ALT: 11 U/L (ref 0–44)
AST: 16 U/L (ref 15–41)
Albumin: 3.6 g/dL (ref 3.5–5.0)
Alkaline Phosphatase: 73 U/L (ref 38–126)
Anion gap: 9 (ref 5–15)
BUN: 10 mg/dL (ref 8–23)
CO2: 26 mmol/L (ref 22–32)
Calcium: 9.3 mg/dL (ref 8.9–10.3)
Chloride: 103 mmol/L (ref 98–111)
Creatinine: 0.74 mg/dL (ref 0.44–1.00)
GFR, Est AFR Am: >60 mL/min (ref >60)
GFR, Estimated: >60 mL/min (ref >60)
Glucose, Bld: 123 mg/dL — ABNORMAL HIGH (ref 70–99)
Potassium: 4.2 mmol/L (ref 3.5–5.1)
Sodium: 138 mmol/L (ref 135–145)
Total Bilirubin: 0.4 mg/dL (ref 0.3–1.2)
Total Protein: 6.6 g/dL (ref 6.5–8.1)

## 2020-04-20 LAB — CBC WITH DIFFERENTIAL (CANCER CENTER ONLY)
Abs Immature Granulocytes: 0.02 10*3/uL (ref 0.00–0.07)
Basophils Absolute: 0 10*3/uL (ref 0.0–0.1)
Basophils Relative: 1 %
Eosinophils Absolute: 0.1 10*3/uL (ref 0.0–0.5)
Eosinophils Relative: 1 %
HCT: 41.2 % (ref 36.0–46.0)
Hemoglobin: 13.4 g/dL (ref 12.0–15.0)
Immature Granulocytes: 0 %
Lymphocytes Relative: 26 %
Lymphs Abs: 1.9 10*3/uL (ref 0.7–4.0)
MCH: 29.7 pg (ref 26.0–34.0)
MCHC: 32.5 g/dL (ref 30.0–36.0)
MCV: 91.4 fL (ref 80.0–100.0)
Monocytes Absolute: 0.6 10*3/uL (ref 0.1–1.0)
Monocytes Relative: 8 %
Neutro Abs: 4.8 10*3/uL (ref 1.7–7.7)
Neutrophils Relative %: 64 %
Platelet Count: 353 10*3/uL (ref 150–400)
RBC: 4.51 MIL/uL (ref 3.87–5.11)
RDW: 13 % (ref 11.5–15.5)
WBC Count: 7.5 10*3/uL (ref 4.0–10.5)
nRBC: 0 % (ref 0.0–0.2)

## 2020-04-20 MED ORDER — ACETAMINOPHEN 325 MG PO TABS
ORAL_TABLET | ORAL | Status: AC
  Filled 2020-04-20: qty 2

## 2020-04-20 MED ORDER — DEXAMETHASONE 4 MG PO TABS
20.0000 mg | ORAL_TABLET | Freq: Once | ORAL | Status: AC
  Administered 2020-04-20: 20 mg via ORAL

## 2020-04-20 MED ORDER — DEXAMETHASONE 4 MG PO TABS
ORAL_TABLET | ORAL | Status: AC
  Filled 2020-04-20: qty 5

## 2020-04-20 MED ORDER — DEXAMETHASONE 4 MG PO TABS
ORAL_TABLET | ORAL | Status: AC
  Filled 2020-04-20: qty 1

## 2020-04-20 MED ORDER — DIPHENHYDRAMINE HCL 25 MG PO CAPS
ORAL_CAPSULE | ORAL | Status: AC
  Filled 2020-04-20: qty 2

## 2020-04-20 MED ORDER — DARATUMUMAB-HYALURONIDASE-FIHJ 1800-30000 MG-UT/15ML ~~LOC~~ SOLN
1800.0000 mg | Freq: Once | SUBCUTANEOUS | Status: AC
  Administered 2020-04-20: 1800 mg via SUBCUTANEOUS
  Filled 2020-04-20: qty 15

## 2020-04-20 MED ORDER — DIPHENHYDRAMINE HCL 25 MG PO CAPS
50.0000 mg | ORAL_CAPSULE | Freq: Once | ORAL | Status: AC
  Administered 2020-04-20: 50 mg via ORAL

## 2020-04-20 MED ORDER — ACETAMINOPHEN 325 MG PO TABS
650.0000 mg | ORAL_TABLET | Freq: Once | ORAL | Status: AC
  Administered 2020-04-20: 650 mg via ORAL

## 2020-04-20 NOTE — Progress Notes (Signed)
Hematology and Oncology Follow Up Visit  Nicole Keith [id number] 1940-09-15 79 y.o. 04/20/2020 10:50 AM [name], [name], [name], MD   Principle Diagnosis: 79 year old woman with multiple myeloma arising from MGUS diagnosed in 2019.  She has IgG lambda with 30% involvement of her bone marrow at the time of diagnosis.  Past therapy:    Velcade and dexamethasone started weekly on 07/27/2018.  Therapy changed in February 2020.  Revlimid 25 mg for 21 days out of a 28-day cycle with dexamethasone started in February 2020.  Therapy was held after 2 cycles because of severe dermatological toxicity.  Pomalyst 1 mg daily for 21 days started on May 1 of 2020.  She completed 4 cycles of therapy.  Current therapy: Ninlaro 4 mg weekly with dexamethasone 20 mg weekly for 3 weeks on and 1 week off started in December 2020.  Daratumumab was added on December 02, 2019.  She is currently receiving daratumumab every other week per starting June 2021.  Interim History:  Nicole Keith returns today for repeat evaluation.  Since the last visit, she reports no major changes in her health.  She continues to tolerate treatment without any major issues or concerns.  She denies any nausea, vomiting or abdominal pain.  She does report some mild fatigue and tiredness associated with treatment.  She denies any mouth pain or dysphagia.          Medications: Reviewed without changes. Current Outpatient Medications  Medication Sig Dispense Refill  . acetaminophen (TYLENOL) 500 MG tablet Take 500 mg by mouth in the morning, at noon, and at bedtime.    . diphenhydramine-acetaminophen (TYLENOL PM) 25-500 MG TABS tablet Take 2 tablets by mouth at bedtime as needed (for sleep).     . famotidine-calcium carbonate-magnesium hydroxide (PEPCID COMPLETE) 10-800-165 MG chewable tablet Chew 1 tablet by mouth daily as needed (for heartburn or indigestion).     [name] Kitchen ibuprofen (ADVIL) 400 MG tablet Take 400 mg by mouth every 6  (six) hours.     . magic mouthwash SOLN Take 5 mLs by mouth 3 (three) times daily as needed for mouth pain. (Patient not taking: Reported on 04/06/2020) 30 mL 0  . NINLARO 4 MG capsule TAKE 1 CAPSULE (4 MG) BY MOUTH WEEKLY, 3 WEEKS ON, 1 WEEK OFF, REPEAT EVERY 4 WEEKS. TAKE ON AN EMPTY STOMACH 1HR BEFORE OR 2HR AFTER MEALS 3 capsule 1  . Polyethyl Glycol-Propyl Glycol (SYSTANE) 0.4-0.3 % SOLN Place 1 drop into both eyes in the morning and at bedtime.      No current facility-administered medications for this visit.     Allergies:  Allergies  Allergen Reactions  . Pomalyst [Pomalidomide] Rash and Other (See Comments)    ENTIRE BODY BROKE OUT IN A RASH  . Revlimid [Lenalidomide] Rash and Other (See Comments)    WHOLE BODY BLISTERED  . Betadine [Povidone Iodine] Rash  . Flagyl [Metronidazole] Diarrhea  . Fosamax [Alendronate Sodium] Other (See Comments)    GI distress  . Bactrim [Sulfamethoxazole-Trimethoprim] Nausea Only  . Clindamycin/Lincomycin     Pt can't remember reaction type.  . Doxepin Hcl Other (See Comments)    Irritable, hallucinations  . Hydrocodone-Acetaminophen     Restlessness  . Hydroxyzine Other (See Comments)    Hallucinations  . Hydroxyzine Hcl Other (See Comments)    Hallucinations   . Tramadol Hcl   . Valium [Diazepam] Other (See Comments)    Reaction not recalled  . Actonel [Risedronate Sodium] Other (See Comments)  GI distress  . Boniva [Ibandronic Acid] Other (See Comments)    GI distress  . Doxycycline Rash  . Penicillins Rash    Ancef was OK      Physical Exam:      Blood pressure 137/81, pulse 78, temperature (!) 97.5 F (36.4 C), temperature source Tympanic, resp. rate 18, height _0  (1.676 m), weight 136 lb 11.2 oz (62 kg), SpO2 97 %.           ECOG: 1   General appearance: Comfortable appearing without any discomfort Head: Normocephalic without any trauma Oropharynx: Mucous membranes are moist and pink without any  thrush or ulcers. Eyes: Pupils are equal and round reactive to light. Lymph nodes: No cervical, supraclavicular, inguinal or axillary lymphadenopathy.   Heart:regular rate and rhythm.  S1 and S2 without leg edema. Lung: Clear without any rhonchi or wheezes.  No dullness to percussion. Abdomin: Soft, nontender, nondistended with good bowel sounds.  No hepatosplenomegaly. Musculoskeletal: No joint deformity or effusion.  Full range of motion noted. Neurological: No deficits noted on motor, sensory and deep tendon reflex exam. Skin: No petechial rash or dryness.  Appeared moist.                      Lab Results: Lab Results  Component Value Date   WBC 7.7 04/06/2020   HGB 13.1 04/06/2020   HCT 41.6 04/06/2020   MCV 92.9 04/06/2020   PLT 406 (H) 04/06/2020     Chemistry      Component Value Date/Time   NA 136 04/06/2020 1106   NA 135 (L) 06/11/2017 0753   K 4.1 04/06/2020 1106   K 4.0 06/11/2017 0753   CL 101 04/06/2020 1106   CO2 26 04/06/2020 1106   CO2 26 06/11/2017 0753   BUN 10 04/06/2020 1106   BUN 12.8 06/11/2017 0753   CREATININE 0.74 04/06/2020 1106   CREATININE 0.8 06/11/2017 0753      Component Value Date/Time   CALCIUM 8.9 04/06/2020 1106   CALCIUM 8.8 06/11/2017 0753   ALKPHOS 68 04/06/2020 1106   ALKPHOS 72 06/11/2017 0753   AST 21 04/06/2020 1106   AST 21 06/11/2017 0753   ALT 18 04/06/2020 1106   ALT 13 06/11/2017 0753   BILITOT 0.3 04/06/2020 1106   BILITOT 0.35 06/11/2017 0753      Results for LAURINE, KUYPER (MRN [id number]) as of 04/06/2020 10:59  Ref. Range 02/24/2020 07:50 03/09/2020 09:51  IFE 1 Unknown Comment (A) Comment (A)  Globulin, Total Latest Ref Range: 2.2 - 3.9 g/dL 3.6 3.2  B-Globulin SerPl Elph-Mcnc Latest Ref Range: 0.7 - 1.3 g/dL 0.9 0.9  IgG (Immunoglobin G), Serum Latest Ref Range: 586 - 1,602 mg/dL 1,617 (H) 1,149  IgM (Immunoglobulin M), Srm Latest Ref Range: 26 - 217 mg/dL 24 (L) 26  IgA Latest Ref Range: 64  - 422 mg/dL 22 (L) 25 (L)    Results for MALINI, FLEMINGS (MRN [id number]) as of 04/20/2020 10:14  Ref. Range 01/13/2020 11:04 02/24/2020 07:50 03/09/2020 09:51  M Protein SerPl Elph-Mcnc Latest Ref Range: Not Observed g/dL 2.8 (H) 1.3 (H) 1.0 (H)      Impression and Plan:  79 year old woman with:  1.  Multiple myeloma arising from MGUS diagnosed in 2019.  She has IgG lambda subtype.   Risks and benefits of continuing her current treatment were discussed at this time.  Complication associated with this therapy including infusion related complications, hand-foot syndrome, nausea among  others were reviewed.  She is agreeable to continue at this time.  Protein studies from July 30 were reiterated again she continues to have improvement in her M spike that is down to 1.0 g/dL.  Alternative treatment options and different salvage therapy treatment choices were reviewed and at this time given her excellent tolerance and benefit I do not recommend changing her therapy.  2.  Anemia: Related to plasma cell disorder and has resolved with improvement of her cancer status.  3. Osteoporosis and compression fracture: She is currently on Zometa every 3 months and will be repeated in October.  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  4.  Herpes zoster reactivation: She is currently on acyclovir without any reactivation noted.   5.  Mucositis: Resolved at this time.  Is related to Ninlaro.  6.  Covid vaccine considerations: Risks and benefits of vaccination were debated again.  She is still concerned about the interaction with her chemo treatment and would like to delay her first injection.  She will consider it after changing her daratumumab to monthly treatment.  We will tentatively schedule her on October 15.   7. Follow-up: She will return on September 24 for daratumumab and have MD follow-up in 4 weeks.  30  minutes were dedicated to this encounter.   The time was spent on reviewing laboratory data, disease status update discussing alternative treatment options and future plan of care review.  Zola Button, MD 9/10/202110:50 AM

## 2020-04-20 NOTE — Addendum Note (Signed)
Addended by: Wyatt Portela on: 04/20/2020 11:17 AM   Modules accepted: Orders

## 2020-04-20 NOTE — Patient Instructions (Signed)
[facility] Discharge Instructions for Patients Receiving Chemotherapy  Today you received the following chemotherapy agents: DARZALEX FASPRO.   To help prevent nausea and vomiting after your treatment, we encourage you to take your nausea medication as directed.   If you develop nausea and vomiting that is not controlled by your nausea medication, call the clinic.   BELOW ARE SYMPTOMS THAT SHOULD BE REPORTED IMMEDIATELY:  *FEVER GREATER THAN 100.5 F  *CHILLS WITH OR WITHOUT FEVER  NAUSEA AND VOMITING THAT IS NOT CONTROLLED WITH YOUR NAUSEA MEDICATION  *UNUSUAL SHORTNESS OF BREATH  *UNUSUAL BRUISING OR BLEEDING  TENDERNESS IN MOUTH AND THROAT WITH OR WITHOUT PRESENCE OF ULCERS  *URINARY PROBLEMS  *BOWEL PROBLEMS  UNUSUAL RASH Items with * indicate a potential emergency and should be followed up as soon as possible.  Feel free to call the clinic should you have any questions or concerns. The clinic phone number is [phone].  Please show the [facility] at check-in to the Emergency Department and triage nurse.

## 2020-04-23 ENCOUNTER — Telehealth: Payer: Self-pay | Admitting: Oncology

## 2020-04-23 LAB — MULTIPLE MYELOMA PANEL, SERUM
Albumin SerPl Elph-Mcnc: 3.3 g/dL (ref 2.9–4.4)
Albumin/Glob SerPl: 1.2 (ref 0.7–1.7)
Alpha 1: 0.3 g/dL (ref 0.0–0.4)
Alpha2 Glob SerPl Elph-Mcnc: 0.9 g/dL (ref 0.4–1.0)
B-Globulin SerPl Elph-Mcnc: 0.9 g/dL (ref 0.7–1.3)
Gamma Glob SerPl Elph-Mcnc: 0.8 g/dL (ref 0.4–1.8)
Globulin, Total: 2.9 g/dL (ref 2.2–3.9)
IgA: 24 mg/dL — ABNORMAL LOW (ref 64–422)
IgG (Immunoglobin G), Serum: 708 mg/dL (ref 586–1602)
IgM (Immunoglobulin M), Srm: 24 mg/dL — ABNORMAL LOW (ref 26–217)
M Protein SerPl Elph-Mcnc: 0.6 g/dL — ABNORMAL HIGH
Total Protein ELP: 6.2 g/dL (ref 6.0–8.5)

## 2020-04-23 LAB — KAPPA/LAMBDA LIGHT CHAINS
Kappa free light chain: 4 mg/L (ref 3.3–19.4)
Kappa, lambda light chain ratio: 1.33 (ref 0.26–1.65)
Lambda free light chains: 3 mg/L — ABNORMAL LOW (ref 5.7–26.3)

## 2020-04-23 NOTE — Telephone Encounter (Signed)
Scheduled appointments per 9/10 los. Patient requested to come in on 11/12 for second covid shot. Patient is aware of all upcoming appointments. I am also mailing the patient an updated calendar per patient request.

## 2020-04-25 ENCOUNTER — Ambulatory Visit: Payer: Medicare Other

## 2020-05-04 ENCOUNTER — Inpatient Hospital Stay: Payer: Medicare Other

## 2020-05-04 ENCOUNTER — Other Ambulatory Visit: Payer: Self-pay

## 2020-05-04 VITALS — BP 141/75 | HR 70 | Temp 98.4°F | Resp 18 | Ht 66.0 in | Wt 136.8 lb

## 2020-05-04 DIAGNOSIS — Z5112 Encounter for antineoplastic immunotherapy: Secondary | ICD-10-CM | POA: Diagnosis not present

## 2020-05-04 DIAGNOSIS — C9000 Multiple myeloma not having achieved remission: Secondary | ICD-10-CM

## 2020-05-04 LAB — CMP (CANCER CENTER ONLY)
ALT: 15 U/L (ref 0–44)
AST: 18 U/L (ref 15–41)
Albumin: 3.7 g/dL (ref 3.5–5.0)
Alkaline Phosphatase: 72 U/L (ref 38–126)
Anion gap: 7 (ref 5–15)
BUN: 15 mg/dL (ref 8–23)
CO2: 27 mmol/L (ref 22–32)
Calcium: 9.5 mg/dL (ref 8.9–10.3)
Chloride: 102 mmol/L (ref 98–111)
Creatinine: 0.73 mg/dL (ref 0.44–1.00)
GFR, Est AFR Am: >60 mL/min (ref >60)
GFR, Estimated: >60 mL/min (ref >60)
Glucose, Bld: 84 mg/dL (ref 70–99)
Potassium: 4.3 mmol/L (ref 3.5–5.1)
Sodium: 136 mmol/L (ref 135–145)
Total Bilirubin: 0.4 mg/dL (ref 0.3–1.2)
Total Protein: 6.9 g/dL (ref 6.5–8.1)

## 2020-05-04 LAB — CBC WITH DIFFERENTIAL (CANCER CENTER ONLY)
Abs Immature Granulocytes: 0.03 10*3/uL (ref 0.00–0.07)
Basophils Absolute: 0 10*3/uL (ref 0.0–0.1)
Basophils Relative: 0 %
Eosinophils Absolute: 0 10*3/uL (ref 0.0–0.5)
Eosinophils Relative: 0 %
HCT: 41.8 % (ref 36.0–46.0)
Hemoglobin: 13.4 g/dL (ref 12.0–15.0)
Immature Granulocytes: 0 %
Lymphocytes Relative: 19 %
Lymphs Abs: 1.8 10*3/uL (ref 0.7–4.0)
MCH: 29.5 pg (ref 26.0–34.0)
MCHC: 32.1 g/dL (ref 30.0–36.0)
MCV: 91.9 fL (ref 80.0–100.0)
Monocytes Absolute: 0.6 10*3/uL (ref 0.1–1.0)
Monocytes Relative: 6 %
Neutro Abs: 6.9 10*3/uL (ref 1.7–7.7)
Neutrophils Relative %: 75 %
Platelet Count: 380 10*3/uL (ref 150–400)
RBC: 4.55 MIL/uL (ref 3.87–5.11)
RDW: 13.3 % (ref 11.5–15.5)
WBC Count: 9.5 10*3/uL (ref 4.0–10.5)
nRBC: 0 % (ref 0.0–0.2)

## 2020-05-04 MED ORDER — ACETAMINOPHEN 325 MG PO TABS
650.0000 mg | ORAL_TABLET | Freq: Once | ORAL | Status: AC
  Administered 2020-05-04: 650 mg via ORAL

## 2020-05-04 MED ORDER — ACETAMINOPHEN 325 MG PO TABS
ORAL_TABLET | ORAL | Status: AC
  Filled 2020-05-04: qty 2

## 2020-05-04 MED ORDER — DARATUMUMAB-HYALURONIDASE-FIHJ 1800-30000 MG-UT/15ML ~~LOC~~ SOLN
1800.0000 mg | Freq: Once | SUBCUTANEOUS | Status: AC
  Administered 2020-05-04: 1800 mg via SUBCUTANEOUS
  Filled 2020-05-04: qty 15

## 2020-05-04 MED ORDER — DIPHENHYDRAMINE HCL 25 MG PO CAPS
ORAL_CAPSULE | ORAL | Status: AC
  Filled 2020-05-04: qty 2

## 2020-05-04 MED ORDER — DEXAMETHASONE 4 MG PO TABS
ORAL_TABLET | ORAL | Status: AC
  Filled 2020-05-04: qty 5

## 2020-05-04 MED ORDER — DIPHENHYDRAMINE HCL 25 MG PO CAPS
50.0000 mg | ORAL_CAPSULE | Freq: Once | ORAL | Status: AC
  Administered 2020-05-04: 50 mg via ORAL

## 2020-05-04 MED ORDER — DEXAMETHASONE 4 MG PO TABS
20.0000 mg | ORAL_TABLET | Freq: Once | ORAL | Status: AC
  Administered 2020-05-04: 20 mg via ORAL

## 2020-05-04 NOTE — Patient Instructions (Signed)
[facility] Discharge Instructions for Patients Receiving Chemotherapy  Today you received the following chemotherapy agents Daratumumab-hyaluronidase-fihj (DARZALEX FASPRO).  To help prevent nausea and vomiting after your treatment, we encourage you to take your nausea medication as prescribed.   If you develop nausea and vomiting that is not controlled by your nausea medication, call the clinic.   BELOW ARE SYMPTOMS THAT SHOULD BE REPORTED IMMEDIATELY:  *FEVER GREATER THAN 100.5 F  *CHILLS WITH OR WITHOUT FEVER  NAUSEA AND VOMITING THAT IS NOT CONTROLLED WITH YOUR NAUSEA MEDICATION  *UNUSUAL SHORTNESS OF BREATH  *UNUSUAL BRUISING OR BLEEDING  TENDERNESS IN MOUTH AND THROAT WITH OR WITHOUT PRESENCE OF ULCERS  *URINARY PROBLEMS  *BOWEL PROBLEMS  UNUSUAL RASH Items with * indicate a potential emergency and should be followed up as soon as possible.  Feel free to call the clinic should you have any questions or concerns. The clinic phone number is [phone].  Please show the [facility] at check-in to the Emergency Department and triage nurse.

## 2020-05-07 LAB — MULTIPLE MYELOMA PANEL, SERUM
Albumin SerPl Elph-Mcnc: 3.5 g/dL (ref 2.9–4.4)
Albumin/Glob SerPl: 1.3 (ref 0.7–1.7)
Alpha 1: 0.3 g/dL (ref 0.0–0.4)
Alpha2 Glob SerPl Elph-Mcnc: 0.9 g/dL (ref 0.4–1.0)
B-Globulin SerPl Elph-Mcnc: 0.9 g/dL (ref 0.7–1.3)
Gamma Glob SerPl Elph-Mcnc: 0.7 g/dL (ref 0.4–1.8)
Globulin, Total: 2.9 g/dL (ref 2.2–3.9)
IgA: 29 mg/dL — ABNORMAL LOW (ref 64–422)
IgG (Immunoglobin G), Serum: 656 mg/dL (ref 586–1602)
IgM (Immunoglobulin M), Srm: 25 mg/dL — ABNORMAL LOW (ref 26–217)
M Protein SerPl Elph-Mcnc: 0.5 g/dL — ABNORMAL HIGH
Total Protein ELP: 6.4 g/dL (ref 6.0–8.5)

## 2020-05-07 LAB — KAPPA/LAMBDA LIGHT CHAINS
Kappa free light chain: 4.9 mg/L (ref 3.3–19.4)
Kappa, lambda light chain ratio: 1.69 — ABNORMAL HIGH (ref 0.26–1.65)
Lambda free light chains: 2.9 mg/L — ABNORMAL LOW (ref 5.7–26.3)

## 2020-05-16 ENCOUNTER — Ambulatory Visit: Payer: Medicare Other

## 2020-05-18 ENCOUNTER — Inpatient Hospital Stay (HOSPITAL_BASED_OUTPATIENT_CLINIC_OR_DEPARTMENT_OTHER): Payer: Medicare Other | Admitting: Oncology

## 2020-05-18 ENCOUNTER — Inpatient Hospital Stay: Payer: Medicare Other

## 2020-05-18 ENCOUNTER — Other Ambulatory Visit: Payer: Self-pay

## 2020-05-18 ENCOUNTER — Inpatient Hospital Stay: Payer: Medicare Other | Attending: Oncology

## 2020-05-18 VITALS — BP 129/81 | HR 80 | Temp 98.3°F | Resp 18 | Ht 66.0 in | Wt 138.8 lb

## 2020-05-18 DIAGNOSIS — Z5112 Encounter for antineoplastic immunotherapy: Secondary | ICD-10-CM | POA: Diagnosis present

## 2020-05-18 DIAGNOSIS — C9000 Multiple myeloma not having achieved remission: Secondary | ICD-10-CM | POA: Diagnosis not present

## 2020-05-18 DIAGNOSIS — Z23 Encounter for immunization: Secondary | ICD-10-CM | POA: Insufficient documentation

## 2020-05-18 LAB — CBC WITH DIFFERENTIAL (CANCER CENTER ONLY)
Abs Immature Granulocytes: 0.01 10*3/uL (ref 0.00–0.07)
Basophils Absolute: 0.1 10*3/uL (ref 0.0–0.1)
Basophils Relative: 1 %
Eosinophils Absolute: 0.1 10*3/uL (ref 0.0–0.5)
Eosinophils Relative: 1 %
HCT: 41.4 % (ref 36.0–46.0)
Hemoglobin: 13.3 g/dL (ref 12.0–15.0)
Immature Granulocytes: 0 %
Lymphocytes Relative: 29 %
Lymphs Abs: 2.2 10*3/uL (ref 0.7–4.0)
MCH: 29.5 pg (ref 26.0–34.0)
MCHC: 32.1 g/dL (ref 30.0–36.0)
MCV: 91.8 fL (ref 80.0–100.0)
Monocytes Absolute: 0.7 10*3/uL (ref 0.1–1.0)
Monocytes Relative: 9 %
Neutro Abs: 4.6 10*3/uL (ref 1.7–7.7)
Neutrophils Relative %: 60 %
Platelet Count: 358 10*3/uL (ref 150–400)
RBC: 4.51 MIL/uL (ref 3.87–5.11)
RDW: 13.6 % (ref 11.5–15.5)
WBC Count: 7.5 10*3/uL (ref 4.0–10.5)
nRBC: 0 % (ref 0.0–0.2)

## 2020-05-18 LAB — CMP (CANCER CENTER ONLY)
ALT: 12 U/L (ref 0–44)
AST: 16 U/L (ref 15–41)
Albumin: 3.5 g/dL (ref 3.5–5.0)
Alkaline Phosphatase: 65 U/L (ref 38–126)
Anion gap: 6 (ref 5–15)
BUN: 10 mg/dL (ref 8–23)
CO2: 29 mmol/L (ref 22–32)
Calcium: 9.2 mg/dL (ref 8.9–10.3)
Chloride: 102 mmol/L (ref 98–111)
Creatinine: 0.77 mg/dL (ref 0.44–1.00)
GFR, Estimated: >60 mL/min (ref >60)
Glucose, Bld: 107 mg/dL — ABNORMAL HIGH (ref 70–99)
Potassium: 4.3 mmol/L (ref 3.5–5.1)
Sodium: 137 mmol/L (ref 135–145)
Total Bilirubin: 0.4 mg/dL (ref 0.3–1.2)
Total Protein: 6.4 g/dL — ABNORMAL LOW (ref 6.5–8.1)

## 2020-05-18 MED ORDER — DIPHENHYDRAMINE HCL 25 MG PO CAPS
50.0000 mg | ORAL_CAPSULE | Freq: Once | ORAL | Status: AC
  Administered 2020-05-18: 50 mg via ORAL

## 2020-05-18 MED ORDER — DARATUMUMAB-HYALURONIDASE-FIHJ 1800-30000 MG-UT/15ML ~~LOC~~ SOLN
1800.0000 mg | Freq: Once | SUBCUTANEOUS | Status: AC
  Administered 2020-05-18: 1800 mg via SUBCUTANEOUS
  Filled 2020-05-18: qty 15

## 2020-05-18 MED ORDER — DIPHENHYDRAMINE HCL 25 MG PO CAPS
ORAL_CAPSULE | ORAL | Status: AC
  Filled 2020-05-18: qty 2

## 2020-05-18 MED ORDER — DEXAMETHASONE 4 MG PO TABS
ORAL_TABLET | ORAL | Status: AC
  Filled 2020-05-18: qty 5

## 2020-05-18 MED ORDER — ACETAMINOPHEN 325 MG PO TABS
ORAL_TABLET | ORAL | Status: AC
  Filled 2020-05-18: qty 2

## 2020-05-18 MED ORDER — DEXAMETHASONE 4 MG PO TABS
20.0000 mg | ORAL_TABLET | Freq: Once | ORAL | Status: AC
  Administered 2020-05-18: 20 mg via ORAL

## 2020-05-18 MED ORDER — ACETAMINOPHEN 325 MG PO TABS
650.0000 mg | ORAL_TABLET | Freq: Once | ORAL | Status: AC
  Administered 2020-05-18: 650 mg via ORAL

## 2020-05-18 NOTE — Progress Notes (Signed)
Hematology and Oncology Follow Up Visit  Nicole Keith [id number] Jul 24, 1941 79 y.o. 05/18/2020 11:34 AM Nicole Keith, [name], [name], MD   Principle Diagnosis: 79 year old woman with IgG lambda multiple myeloma diagnosed in 2019.  She was found to have 30% involvement of her bone marrow arising from MGUS.  Past therapy:    Velcade and dexamethasone started weekly on 07/27/2018.  Therapy changed in February 2020.  Revlimid 25 mg for 21 days out of a 28-day cycle with dexamethasone started in February 2020.  Therapy was held after 2 cycles because of severe dermatological toxicity.  Pomalyst 1 mg daily for 21 days started on May 1 of 2020.  She completed 4 cycles of therapy.  Current therapy: Ninlaro 4 mg weekly with dexamethasone 20 mg weekly for 3 weeks on and 1 week off started in December 2020.  Daratumumab was added on December 02, 2019.  She is currently receiving daratumumab every other week per starting June 2021.    She will receive daratumumab monthly starting May 18, 2020.  Interim History:  Nicole Keith presents today for a follow-up visit.  Since her last visit, she reports no major changes in her health.  She continues to tolerate current therapy without any complaints.  She denies any nausea, vomiting or abdominal pain.  She denies any bone pain or pathological fractures.  She denies any recent hospitalizations or illnesses..          Medications: Unchanged on review. Current Outpatient Medications  Medication Sig Dispense Refill  . acetaminophen (TYLENOL) 500 MG tablet Take 500 mg by mouth in the morning, at noon, and at bedtime.     . diphenhydramine-acetaminophen (TYLENOL PM) 25-500 MG TABS tablet Take 2 tablets by mouth at bedtime as needed (for sleep).     . famotidine-calcium carbonate-magnesium hydroxide (PEPCID COMPLETE) 10-800-165 MG chewable tablet Chew 1 tablet by mouth daily as needed (for heartburn or indigestion).     [name] Kitchen GERI-LANTA 200-200-20 MG/5ML  suspension Take by mouth.    [name] Kitchen ibuprofen (ADVIL) 400 MG tablet Take 400 mg by mouth every 6 (six) hours.     [name] Kitchen NINLARO 4 MG capsule TAKE 1 CAPSULE (4 MG) BY MOUTH WEEKLY, 3 WEEKS ON, 1 WEEK OFF, REPEAT EVERY 4 WEEKS. TAKE ON AN EMPTY STOMACH 1HR BEFORE OR 2HR AFTER MEALS 3 capsule 1  . Polyethyl Glycol-Propyl Glycol (SYSTANE) 0.4-0.3 % SOLN Place 1 drop into both eyes in the morning and at bedtime.     . magic mouthwash SOLN Take 5 mLs by mouth 3 (three) times daily as needed for mouth pain. (Patient not taking: Reported on 04/06/2020) 30 mL 0   No current facility-administered medications for this visit.     Allergies:  Allergies  Allergen Reactions  . Pomalyst [Pomalidomide] Rash and Other (See Comments)    ENTIRE BODY BROKE OUT IN A RASH  . Revlimid [Lenalidomide] Rash and Other (See Comments)    WHOLE BODY BLISTERED  . Betadine [Povidone Iodine] Rash  . Flagyl [Metronidazole] Diarrhea  . Fosamax [Alendronate Sodium] Other (See Comments)    GI distress  . Bactrim [Sulfamethoxazole-Trimethoprim] Nausea Only  . Clindamycin/Lincomycin     Pt can't remember reaction type.  . Doxepin Hcl Other (See Comments)    Irritable, hallucinations  . Hydrocodone-Acetaminophen     Restlessness  . Hydroxyzine Other (See Comments)    Hallucinations  . Hydroxyzine Hcl Other (See Comments)    Hallucinations   . Tramadol Hcl   . Valium [Diazepam] Other (  See Comments)    Reaction not recalled  . Actonel [Risedronate Sodium] Other (See Comments)    GI distress  . Boniva [Ibandronic Acid] Other (See Comments)    GI distress  . Doxycycline Rash  . Penicillins Rash    Ancef was OK      Physical Exam:      Blood pressure 129/81, pulse 80, temperature 98.3 F (36.8 C), temperature source Tympanic, resp. rate 18, height '5\' 6"'  (1.676 m), weight 138 lb 12.8 oz (63 kg), SpO2 98 %.           ECOG: 1    General appearance: Alert, awake without any distress. Head: Atraumatic  without abnormalities Oropharynx: Without any thrush or ulcers. Eyes: No scleral icterus. Lymph nodes: No lymphadenopathy noted in the cervical, supraclavicular, or axillary nodes Heart:regular rate and rhythm, without any murmurs or gallops.   Lung: Clear to auscultation without any rhonchi, wheezes or dullness to percussion. Abdomin: Soft, nontender without any shifting dullness or ascites. Musculoskeletal: No clubbing or cyanosis. Neurological: No motor or sensory deficits. Skin: No rashes or lesions.                     Lab Results: Lab Results  Component Value Date   WBC 7.5 05/18/2020   HGB 13.3 05/18/2020   HCT 41.4 05/18/2020   MCV 91.8 05/18/2020   PLT 358 05/18/2020     Chemistry      Component Value Date/Time   NA 136 05/04/2020 1128   NA 135 (L) 06/11/2017 0753   K 4.3 05/04/2020 1128   K 4.0 06/11/2017 0753   CL 102 05/04/2020 1128   CO2 27 05/04/2020 1128   CO2 26 06/11/2017 0753   BUN 15 05/04/2020 1128   BUN 12.8 06/11/2017 0753   CREATININE 0.73 05/04/2020 1128   CREATININE 0.8 06/11/2017 0753      Component Value Date/Time   CALCIUM 9.5 05/04/2020 1128   CALCIUM 8.8 06/11/2017 0753   ALKPHOS 72 05/04/2020 1128   ALKPHOS 72 06/11/2017 0753   AST 18 05/04/2020 1128   AST 21 06/11/2017 0753   ALT 15 05/04/2020 1128   ALT 13 06/11/2017 0753   BILITOT 0.4 05/04/2020 1128   BILITOT 0.35 06/11/2017 0753      Results for Nicole, Keith (MRN [id number]) as of 05/18/2020 11:21  Ref. Range 03/09/2020 09:51 04/20/2020 10:30 05/04/2020 11:28  M Protein SerPl Elph-Mcnc Latest Ref Range: Not Observed g/dL 1.0 (H) 0.6 (H) 0.5 (H)  IFE 1 Unknown Comment (A) Comment (A) Comment (A)  Globulin, Total Latest Ref Range: 2.2 - 3.9 g/dL 3.2 2.9 2.9  B-Globulin SerPl Elph-Mcnc Latest Ref Range: 0.7 - 1.3 g/dL 0.9 0.9 0.9  IgG (Immunoglobin G), Serum Latest Ref Range: 586 - 1,602 mg/dL 1,149 708 656  IgM (Immunoglobulin M), Srm Latest Ref Range: 26 -  217 mg/dL 26 24 (L) 25 (L)  IgA Latest Ref Range: 64 - 422 mg/dL 25 (L) 24 (L) 29 (L)       Impression and Plan:  79 year old woman with:  1.  IgG lambda multiple myeloma diagnosed in 2019.   She continues to tolerate therapy with daratumumab and Ninlaro without any recent complications.  Risks and benefits of continuing this therapy were discussed today.  Protein studies obtained in September 2021 were reviewed personally and discussed with the patient.  Her M spike continues to decline currently at 0.5 g/dL approaching a complete response.  She continues to have  slightly elevated kappa to lambda ratio.  I recommended continuing treatment at this time with the same dose and schedule.  We will last switched to a daratumumab monthly after today's infusion.  2.  Anemia: Related to plasma cell disorder with improvement in her hemoglobin.  3. Osteoporosis and compression fracture: She will receive Zometa today and repeated in 3 months.  Complication clinic osteonecrosis of jaw and hypocalcemia were reiterated.  4.  Herpes zoster reactivation: No reactivation noted and currently on acyclovir.  5.  Covid vaccine considerations: After discussion she is agreeable to proceed with her first vaccination on May 25, 2020.  Risks of vaccination predominantly allergy was reiterated.  She is ready to proceed.   7. Follow-up: In 4 weeks for the next infusion.  30  minutes were spent on this visit.  The time was dedicated to reviewing her disease status, discussing treatment options and outlining future plan of care.  Zola Button, MD 10/8/202111:34 AM

## 2020-05-18 NOTE — Patient Instructions (Signed)
[facility] Discharge Instructions for Patients Receiving Chemotherapy  Today you received the following chemotherapy agents Daratumumab-hyaluronidase-fihj (DARZALEX FASPRO).  To help prevent nausea and vomiting after your treatment, we encourage you to take your nausea medication as prescribed.   If you develop nausea and vomiting that is not controlled by your nausea medication, call the clinic.   BELOW ARE SYMPTOMS THAT SHOULD BE REPORTED IMMEDIATELY:  *FEVER GREATER THAN 100.5 F  *CHILLS WITH OR WITHOUT FEVER  NAUSEA AND VOMITING THAT IS NOT CONTROLLED WITH YOUR NAUSEA MEDICATION  *UNUSUAL SHORTNESS OF BREATH  *UNUSUAL BRUISING OR BLEEDING  TENDERNESS IN MOUTH AND THROAT WITH OR WITHOUT PRESENCE OF ULCERS  *URINARY PROBLEMS  *BOWEL PROBLEMS  UNUSUAL RASH Items with * indicate a potential emergency and should be followed up as soon as possible.  Feel free to call the clinic should you have any questions or concerns. The clinic phone number is [phone].  Please show the [facility] at check-in to the Emergency Department and triage nurse.

## 2020-05-21 MED FILL — CMPD-MMW:MYLANTA,LIDO,NYST: 10 days supply | Qty: 150 | Fill #0

## 2020-05-24 MED FILL — NINLARO 4 MG CAPS: 4 | 28 days supply | Qty: 3 | Fill #1

## 2020-05-25 ENCOUNTER — Other Ambulatory Visit: Payer: Self-pay

## 2020-05-25 ENCOUNTER — Inpatient Hospital Stay: Payer: Medicare Other

## 2020-05-25 DIAGNOSIS — Z23 Encounter for immunization: Secondary | ICD-10-CM | POA: Diagnosis not present

## 2020-05-25 DIAGNOSIS — C9000 Multiple myeloma not having achieved remission: Secondary | ICD-10-CM | POA: Diagnosis present

## 2020-05-25 DIAGNOSIS — Z5112 Encounter for antineoplastic immunotherapy: Secondary | ICD-10-CM | POA: Diagnosis not present

## 2020-05-25 NOTE — Progress Notes (Signed)
Patient stayed 15 min post observation COVID vaccination and was discharged in stable condition with no complaints

## 2020-06-14 ENCOUNTER — Other Ambulatory Visit: Payer: Self-pay | Admitting: Oncology

## 2020-06-15 ENCOUNTER — Ambulatory Visit: Payer: Medicare Other

## 2020-06-15 ENCOUNTER — Other Ambulatory Visit: Payer: Self-pay

## 2020-06-15 ENCOUNTER — Inpatient Hospital Stay: Payer: Medicare Other | Attending: Oncology

## 2020-06-15 ENCOUNTER — Inpatient Hospital Stay: Payer: Medicare Other

## 2020-06-15 ENCOUNTER — Inpatient Hospital Stay (HOSPITAL_BASED_OUTPATIENT_CLINIC_OR_DEPARTMENT_OTHER): Payer: Medicare Other | Admitting: Oncology

## 2020-06-15 VITALS — BP 134/78 | HR 84 | Temp 97.2°F | Resp 18 | Ht 66.0 in | Wt 137.5 lb

## 2020-06-15 DIAGNOSIS — C9000 Multiple myeloma not having achieved remission: Secondary | ICD-10-CM

## 2020-06-15 DIAGNOSIS — Z5112 Encounter for antineoplastic immunotherapy: Secondary | ICD-10-CM | POA: Insufficient documentation

## 2020-06-15 DIAGNOSIS — Z23 Encounter for immunization: Secondary | ICD-10-CM | POA: Diagnosis not present

## 2020-06-15 LAB — CMP (CANCER CENTER ONLY)
ALT: 11 U/L (ref 0–44)
AST: 17 U/L (ref 15–41)
Albumin: 3.7 g/dL (ref 3.5–5.0)
Alkaline Phosphatase: 59 U/L (ref 38–126)
Anion gap: 7 (ref 5–15)
BUN: 10 mg/dL (ref 8–23)
CO2: 28 mmol/L (ref 22–32)
Calcium: 8.9 mg/dL (ref 8.9–10.3)
Chloride: 101 mmol/L (ref 98–111)
Creatinine: 0.8 mg/dL (ref 0.44–1.00)
GFR, Estimated: >60 mL/min (ref >60)
Glucose, Bld: 152 mg/dL — ABNORMAL HIGH (ref 70–99)
Potassium: 4.1 mmol/L (ref 3.5–5.1)
Sodium: 136 mmol/L (ref 135–145)
Total Bilirubin: 0.4 mg/dL (ref 0.3–1.2)
Total Protein: 6.3 g/dL — ABNORMAL LOW (ref 6.5–8.1)

## 2020-06-15 LAB — CBC WITH DIFFERENTIAL (CANCER CENTER ONLY)
Abs Immature Granulocytes: 0.01 10*3/uL (ref 0.00–0.07)
Basophils Absolute: 0.1 10*3/uL (ref 0.0–0.1)
Basophils Relative: 1 %
Eosinophils Absolute: 0 10*3/uL (ref 0.0–0.5)
Eosinophils Relative: 1 %
HCT: 41.6 % (ref 36.0–46.0)
Hemoglobin: 13.4 g/dL (ref 12.0–15.0)
Immature Granulocytes: 0 %
Lymphocytes Relative: 29 %
Lymphs Abs: 1.9 10*3/uL (ref 0.7–4.0)
MCH: 28.9 pg (ref 26.0–34.0)
MCHC: 32.2 g/dL (ref 30.0–36.0)
MCV: 89.7 fL (ref 80.0–100.0)
Monocytes Absolute: 0.6 10*3/uL (ref 0.1–1.0)
Monocytes Relative: 9 %
Neutro Abs: 4 10*3/uL (ref 1.7–7.7)
Neutrophils Relative %: 60 %
Platelet Count: 340 10*3/uL (ref 150–400)
RBC: 4.64 MIL/uL (ref 3.87–5.11)
RDW: 14.5 % (ref 11.5–15.5)
WBC Count: 6.6 10*3/uL (ref 4.0–10.5)
nRBC: 0 % (ref 0.0–0.2)

## 2020-06-15 MED ORDER — DARATUMUMAB-HYALURONIDASE-FIHJ 1800-30000 MG-UT/15ML ~~LOC~~ SOLN
1800.0000 mg | Freq: Once | SUBCUTANEOUS | Status: AC
  Administered 2020-06-15: 1800 mg via SUBCUTANEOUS
  Filled 2020-06-15: qty 15

## 2020-06-15 MED ORDER — ACETAMINOPHEN 325 MG PO TABS
650.0000 mg | ORAL_TABLET | Freq: Once | ORAL | Status: AC
  Administered 2020-06-15: 650 mg via ORAL

## 2020-06-15 MED ORDER — DIPHENHYDRAMINE HCL 25 MG PO CAPS
50.0000 mg | ORAL_CAPSULE | Freq: Once | ORAL | Status: AC
  Administered 2020-06-15: 50 mg via ORAL

## 2020-06-15 MED ORDER — DEXAMETHASONE 4 MG PO TABS
20.0000 mg | ORAL_TABLET | Freq: Once | ORAL | Status: AC
  Administered 2020-06-15: 20 mg via ORAL

## 2020-06-15 MED ORDER — ACETAMINOPHEN 325 MG PO TABS
ORAL_TABLET | ORAL | Status: AC
  Filled 2020-06-15: qty 2

## 2020-06-15 MED ORDER — DEXAMETHASONE 4 MG PO TABS
ORAL_TABLET | ORAL | Status: AC
  Filled 2020-06-15: qty 5

## 2020-06-15 MED ORDER — DIPHENHYDRAMINE HCL 25 MG PO CAPS
ORAL_CAPSULE | ORAL | Status: AC
  Filled 2020-06-15: qty 2

## 2020-06-15 NOTE — Progress Notes (Signed)
Pt. states she did not take Decadron at home so ordered and given here. Pt. also states she does not stay for any post observation.

## 2020-06-15 NOTE — Patient Instructions (Addendum)
[facility] Discharge Instructions for Patients Receiving Chemotherapy  Today you received the following immunotherapy agent: Daratumumab (Darzalex Faspro)  To help prevent nausea and vomiting after your treatment, we encourage you to take your nausea medication as directed by your MD.   If you develop nausea and vomiting that is not controlled by your nausea medication, call the clinic.   BELOW ARE SYMPTOMS THAT SHOULD BE REPORTED IMMEDIATELY:  *FEVER GREATER THAN 100.5 F  *CHILLS WITH OR WITHOUT FEVER  NAUSEA AND VOMITING THAT IS NOT CONTROLLED WITH YOUR NAUSEA MEDICATION  *UNUSUAL SHORTNESS OF BREATH  *UNUSUAL BRUISING OR BLEEDING  TENDERNESS IN MOUTH AND THROAT WITH OR WITHOUT PRESENCE OF ULCERS  *URINARY PROBLEMS  *BOWEL PROBLEMS  UNUSUAL RASH Items with * indicate a potential emergency and should be followed up as soon as possible.  Feel free to call the clinic should you have any questions or concerns. The clinic phone number is [phone].  Please show the [facility] at check-in to the Emergency Department and triage nurse.

## 2020-06-15 NOTE — Progress Notes (Signed)
Hematology and Oncology Follow Up Visit  Nicole Keith [id number] [birth year] 79 y.o. 06/15/2020 11:41 AM [name], [name], [name], MD   Principle Diagnosis: 79 year old woman with multiple myeloma presented with 30% plasma cell infiltration arising from MGUS in 2019. She was found to have IgG lambda.  Past therapy:    Velcade and dexamethasone started weekly on 07/27/2018.  Therapy changed in February 2020.  Revlimid 25 mg for 21 days out of a 28-day cycle with dexamethasone started in February 2020.  Therapy was held after 2 cycles because of severe dermatological toxicity.  Pomalyst 1 mg daily for 21 days started on May 1 of 2020.  She completed 4 cycles of therapy.  Current therapy: Ninlaro 4 mg weekly with dexamethasone 20 mg weekly for 3 weeks on and 1 week off started in December 2020.  Daratumumab was added on December 02, 2019.  She is currently receiving daratumumab every other week per starting June 2021.    She will receive daratumumab monthly starting May 18, 2020.  Interim History:  Nicole Keith returns today for repeat evaluation. Since the last visit, she reports no major changes in her health.  She denies any recent hospitalization or illnesses.  She continues to tolerate therapy without any major complications.  She is experiencing more complications with Ninlaro predominantly more fatigue and tiredness and occasional skin rash.  She is ambulating short distances without any major difficulties.  She did have a toothache and was evaluated by her dentist and scheduled to have a root canal.          Medications: Reviewed without changes. Current Outpatient Medications  Medication Sig Dispense Refill  . acetaminophen (TYLENOL) 500 MG tablet Take 500 mg by mouth in the morning, at noon, and at bedtime.     . diphenhydramine-acetaminophen (TYLENOL PM) 25-500 MG TABS tablet Take 2 tablets by mouth at bedtime as needed (for sleep).     . famotidine-calcium  carbonate-magnesium hydroxide (PEPCID COMPLETE) 10-800-165 MG chewable tablet Chew 1 tablet by mouth daily as needed (for heartburn or indigestion).     [name] Kitchen GERI-LANTA 200-200-20 MG/5ML suspension Take by mouth.    [name] Kitchen ibuprofen (ADVIL) 400 MG tablet Take 400 mg by mouth every 6 (six) hours.     . magic mouthwash SOLN Take 5 mLs by mouth 3 (three) times daily as needed for mouth pain. (Patient not taking: Reported on 04/06/2020) 30 mL 0  . NINLARO 4 MG capsule TAKE 1 CAPSULE (4 MG) BY MOUTH WEEKLY, 3 WEEKS ON, 1 WEEK OFF, REPEAT EVERY 4 WEEKS. TAKE ON AN EMPTY STOMACH 1HR BEFORE OR 2HR AFTER MEALS 3 capsule 1  . Polyethyl Glycol-Propyl Glycol (SYSTANE) 0.4-0.3 % SOLN Place 1 drop into both eyes in the morning and at bedtime.      No current facility-administered medications for this visit.     Allergies:  Allergies  Allergen Reactions  . Pomalyst [Pomalidomide] Rash and Other (See Comments)    ENTIRE BODY BROKE OUT IN A RASH  . Revlimid [Lenalidomide] Rash and Other (See Comments)    WHOLE BODY BLISTERED  . Betadine [Povidone Iodine] Rash  . Flagyl [Metronidazole] Diarrhea  . Fosamax [Alendronate Sodium] Other (See Comments)    GI distress  . Bactrim [Sulfamethoxazole-Trimethoprim] Nausea Only  . Clindamycin/Lincomycin     Pt can't remember reaction type.  . Doxepin Hcl Other (See Comments)    Irritable, hallucinations  . Hydrocodone-Acetaminophen     Restlessness  . Hydroxyzine Other (See Comments)  Hallucinations  . Hydroxyzine Hcl Other (See Comments)    Hallucinations   . Tramadol Hcl   . Valium [Diazepam] Other (See Comments)    Reaction not recalled  . Actonel [Risedronate Sodium] Other (See Comments)    GI distress  . Boniva [Ibandronic Acid] Other (See Comments)    GI distress  . Doxycycline Rash  . Penicillins Rash    Ancef was OK      Physical Exam:       Blood pressure 134/78, pulse 84, temperature (!) 97.2 F (36.2 C), temperature source Tympanic,  resp. rate 18, height _0  (1.676 m), weight 137 lb 8 oz (62.4 kg), SpO2 97 %.           ECOG: 1    General appearance: Comfortable appearing without any discomfort Head: Normocephalic without any trauma Oropharynx: Mucous membranes are moist and pink without any thrush or ulcers. Eyes: Pupils are equal and round reactive to light. Lymph nodes: No cervical, supraclavicular, inguinal or axillary lymphadenopathy.   Heart:regular rate and rhythm.  S1 and S2 without leg edema. Lung: Clear without any rhonchi or wheezes.  No dullness to percussion. Abdomin: Soft, nontender, nondistended with good bowel sounds.  No hepatosplenomegaly. Musculoskeletal: No joint deformity or effusion.  Full range of motion noted. Neurological: No deficits noted on motor, sensory and deep tendon reflex exam. Skin: No petechial rash or dryness.  Appeared moist.                       Lab Results: Lab Results  Component Value Date   WBC 6.6 06/15/2020   HGB 13.4 06/15/2020   HCT 41.6 06/15/2020   MCV 89.7 06/15/2020   PLT 340 06/15/2020     Chemistry      Component Value Date/Time   NA 137 05/18/2020 1051   NA 135 (L) 06/11/2017 0753   K 4.3 05/18/2020 1051   K 4.0 06/11/2017 0753   CL 102 05/18/2020 1051   CO2 29 05/18/2020 1051   CO2 26 06/11/2017 0753   BUN 10 05/18/2020 1051   BUN 12.8 06/11/2017 0753   CREATININE 0.77 05/18/2020 1051   CREATININE 0.8 06/11/2017 0753      Component Value Date/Time   CALCIUM 9.2 05/18/2020 1051   CALCIUM 8.8 06/11/2017 0753   ALKPHOS 65 05/18/2020 1051   ALKPHOS 72 06/11/2017 0753   AST 16 05/18/2020 1051   AST 21 06/11/2017 0753   ALT 12 05/18/2020 1051   ALT 13 06/11/2017 0753   BILITOT 0.4 05/18/2020 1051   BILITOT 0.35 06/11/2017 0753       Results for Nicole Keith, Nicole Keith (MRN [id number]) as of 06/15/2020 11:43  Ref. Range 04/20/2020 10:30 05/04/2020 11:28  M Protein SerPl Elph-Mcnc Latest Ref Range: Not Observed g/dL 0.6  (H) 0.5 (H)  IFE 1 Unknown Comment (A) Comment (A)  Globulin, Total Latest Ref Range: 2.2 - 3.9 g/dL 2.9 2.9  B-Globulin SerPl Elph-Mcnc Latest Ref Range: 0.7 - 1.3 g/dL 0.9 0.9  IgG (Immunoglobin G), Serum Latest Ref Range: 586 - 1,602 mg/dL 708 656  IgM (Immunoglobulin M), Srm Latest Ref Range: 26 - 217 mg/dL 24 (L) 25 (L)  IgA Latest Ref Range: 64 - 422 mg/dL 24 (L) 29 (L)    Results for Nicole Keith, Nicole Keith (MRN [id number]) as of 06/15/2020 11:43  Ref. Range 04/20/2020 10:29 05/04/2020 11:28  Kappa free light chain Latest Ref Range: 3.3 - 19.4 mg/L 4.0 4.9  Lamda free  light chains Latest Ref Range: 5.7 - 26.3 mg/L 3.0 (L) 2.9 (L)  Kappa, lamda light chain ratio Latest Ref Range: 0.26 - 1.65  1.33 1.69 (H)     Impression and Plan:  79 year old woman with:  1. Relapsed multiple myeloma initially diagnosed in 2019 with IgG lambda arising from MGUS.   The natural course of her disease was reviewed and treatment options were reiterated. She continues to tolerate current therapy with Ninlaro dexamethasone and daratumumab without any major complications. Protein studies continues to show excellent response to therapy although with very little residual disease. Risks and benefits of continuing treatment for the time being were discussed she is agreeable to continue with daratumumab alone.  We will discontinue Ninlaro based on her preferences and few complications she is experiencing.  2.  Anemia: Hemoglobin is within normal range at this time. Her anemia is related to plasma cell disorder.  3. Osteoporosis and compression fracture: Zometa will be withheld at this time given her ongoing dental issues.  4.  Herpes zoster reactivation: No reactivation noted and currently on acyclovir.  5.  Covid vaccine considerations: She received the first injection without any complications.  She is scheduled for her second in November 2021.  7. Follow-up: On December 3 for the next treatments.  30  minutes  were dedicated to this encounter. The time was spent on reviewing disease status, discussing treatment options reviewing laboratory data and future plan of care.  Zola Button, MD 11/5/202111:41 AM

## 2020-06-18 LAB — MULTIPLE MYELOMA PANEL, SERUM
Albumin SerPl Elph-Mcnc: 3.5 g/dL (ref 2.9–4.4)
Albumin/Glob SerPl: 1.5 (ref 0.7–1.7)
Alpha 1: 0.3 g/dL (ref 0.0–0.4)
Alpha2 Glob SerPl Elph-Mcnc: 0.8 g/dL (ref 0.4–1.0)
B-Globulin SerPl Elph-Mcnc: 0.9 g/dL (ref 0.7–1.3)
Gamma Glob SerPl Elph-Mcnc: 0.6 g/dL (ref 0.4–1.8)
Globulin, Total: 2.5 g/dL (ref 2.2–3.9)
IgA: 23 mg/dL — ABNORMAL LOW (ref 64–422)
IgG (Immunoglobin G), Serum: 543 mg/dL — ABNORMAL LOW (ref 586–1602)
IgM (Immunoglobulin M), Srm: 21 mg/dL — ABNORMAL LOW (ref 26–217)
M Protein SerPl Elph-Mcnc: 0.3 g/dL — ABNORMAL HIGH
Total Protein ELP: 6 g/dL (ref 6.0–8.5)

## 2020-06-18 LAB — KAPPA/LAMBDA LIGHT CHAINS
Kappa free light chain: 4.3 mg/L (ref 3.3–19.4)
Kappa, lambda light chain ratio: 1.48 (ref 0.26–1.65)
Lambda free light chains: 2.9 mg/L — ABNORMAL LOW (ref 5.7–26.3)

## 2020-06-22 ENCOUNTER — Other Ambulatory Visit: Payer: Self-pay

## 2020-06-22 ENCOUNTER — Inpatient Hospital Stay: Payer: Medicare Other

## 2020-06-22 DIAGNOSIS — Z23 Encounter for immunization: Secondary | ICD-10-CM

## 2020-06-22 DIAGNOSIS — C9000 Multiple myeloma not having achieved remission: Secondary | ICD-10-CM | POA: Diagnosis present

## 2020-06-22 DIAGNOSIS — Z5112 Encounter for antineoplastic immunotherapy: Secondary | ICD-10-CM | POA: Diagnosis present

## 2020-06-22 NOTE — Progress Notes (Signed)
   Covid-19 Vaccination Clinic  Name:  Nicole Keith    MRN: [id number] DOB: 1941/05/20  06/22/2020  [name] was observed post Covid-19 immunization for 15 minutes without incident. She was provided with Vaccine Information Sheet and instruction to access the V-Safe system.   [name] was instructed to call 911 with any severe reactions post vaccine: [name] Kitchen Difficulty breathing  . Swelling of face and throat  . A fast heartbeat  . A bad rash all over body  . Dizziness and weakness   Immunizations Administered    Name Date Dose VIS Date Route   Pfizer COVID-19 Vaccine 06/22/2020 10:08 AM 0.3 mL 05/30/2020 Intramuscular   Manufacturer: [place]   Lot: [id number]   NDC: [address]-8

## 2020-06-22 NOTE — Patient Instructions (Signed)
Preventing Disease Through Immunization Immunization means developing a lower risk of getting a disease due to improvements in the body's disease-fighting system (immune system). Immunization can happen through:  Natural exposure to a disease.  Getting shots (vaccination). Vaccination involves putting a small amount of germs (vaccines) into the body. This may be done through one or more shots. Some vaccines can be given by mouth or as a nasal spray, instead of a shot. Vaccination helps to prevent:  Serious diseases such as polio, measles, and whooping cough.  Common infections, such as the flu. Vaccination starts at birth. Teens and adults also need vaccines regularly. Talk with your health care provider about the immunization schedule that is best for you. Some vaccines need to be repeated when you are older. How does immunization prevent disease? Immunization occurs when the body is exposed to germs that cause a certain disease. The body responds to this exposure by forming proteins (antibodies) to fight those germs. Germs in vaccines are dead or very weak, so they will not make you sick. However, the antibodies that your body makes will stay in your body for a long time. This improves the ability of your immune system to fight the germs in the future. If you get exposed to the germs again, you may be able to resist them (develop immunity against them). This is because your antibodies may be able to destroy the germs before you get sick. Why should I prevent diseases through immunization? Vaccines can protect you from getting diseases that can cause harmful complications and even death. Getting vaccinated also helps to keep other people healthy. If you are vaccinated, you cannot spread disease to others, and that can make the disease become less common. If people keep getting vaccinated, certain diseases may become rare or go away. If people stop getting vaccinated, certain diseases could become  more common. Not everyone can get a vaccine. Very young babies, people who are very sick, or older people may not be able to get vaccines. By getting immunized, you help to protect people who are not able to be vaccinated. Where to find more information To learn more about immunization, visit:  World Health Organization: https://www.davis-walter.com/  Centers for Disease Control and Prevention: http://www.murphy.com/ Summary  Immunization occurs when the body is exposed to germs that cause a certain disease and responds by forming proteins (antibodies) to fight those germs.  Getting vaccines is a safe and effective way to develop immunity against specific germs and the diseases that they cause.  Talk with your health care provider about your immunization schedule, and stay up to date with all of your shots. This information is not intended to replace advice given to you by your health care provider. Make sure you discuss any questions you have with your health care provider. Document Revised: 11/19/2018 Document Reviewed: 04/05/2016 Elsevier Patient Education  2020 [employer].

## 2020-06-28 ENCOUNTER — Other Ambulatory Visit (HOSPITAL_COMMUNITY): Payer: Self-pay | Admitting: Dentistry

## 2020-06-28 MED FILL — levoFLOXacin 500 MG TABS: 500 | 7 days supply | Qty: 7 | Fill #0

## 2020-07-13 ENCOUNTER — Inpatient Hospital Stay (HOSPITAL_BASED_OUTPATIENT_CLINIC_OR_DEPARTMENT_OTHER): Payer: Medicare Other | Admitting: Oncology

## 2020-07-13 ENCOUNTER — Inpatient Hospital Stay: Payer: Medicare Other

## 2020-07-13 ENCOUNTER — Inpatient Hospital Stay: Payer: Medicare Other | Attending: Oncology

## 2020-07-13 ENCOUNTER — Other Ambulatory Visit: Payer: Self-pay

## 2020-07-13 VITALS — BP 152/79 | HR 69 | Temp 98.5°F | Resp 16

## 2020-07-13 VITALS — BP 138/80 | HR 83 | Temp 97.0°F | Resp 17 | Ht 66.0 in | Wt 136.9 lb

## 2020-07-13 DIAGNOSIS — Z5112 Encounter for antineoplastic immunotherapy: Secondary | ICD-10-CM | POA: Diagnosis not present

## 2020-07-13 DIAGNOSIS — C9000 Multiple myeloma not having achieved remission: Secondary | ICD-10-CM | POA: Diagnosis not present

## 2020-07-13 DIAGNOSIS — D472 Monoclonal gammopathy: Secondary | ICD-10-CM

## 2020-07-13 LAB — CMP (CANCER CENTER ONLY)
ALT: 9 U/L (ref 0–44)
AST: 16 U/L (ref 15–41)
Albumin: 3.6 g/dL (ref 3.5–5.0)
Alkaline Phosphatase: 57 U/L (ref 38–126)
Anion gap: 10 (ref 5–15)
BUN: 13 mg/dL (ref 8–23)
CO2: 23 mmol/L (ref 22–32)
Calcium: 9 mg/dL (ref 8.9–10.3)
Chloride: 107 mmol/L (ref 98–111)
Creatinine: 0.74 mg/dL (ref 0.44–1.00)
GFR, Estimated: >60 mL/min (ref >60)
Glucose, Bld: 125 mg/dL — ABNORMAL HIGH (ref 70–99)
Potassium: 4.2 mmol/L (ref 3.5–5.1)
Sodium: 140 mmol/L (ref 135–145)
Total Bilirubin: 0.4 mg/dL (ref 0.3–1.2)
Total Protein: 6.2 g/dL — ABNORMAL LOW (ref 6.5–8.1)

## 2020-07-13 LAB — CBC WITH DIFFERENTIAL (CANCER CENTER ONLY)
Abs Immature Granulocytes: 0.01 10*3/uL (ref 0.00–0.07)
Basophils Absolute: 0.1 10*3/uL (ref 0.0–0.1)
Basophils Relative: 1 %
Eosinophils Absolute: 0.1 10*3/uL (ref 0.0–0.5)
Eosinophils Relative: 2 %
HCT: 40.8 % (ref 36.0–46.0)
Hemoglobin: 13.3 g/dL (ref 12.0–15.0)
Immature Granulocytes: 0 %
Lymphocytes Relative: 24 %
Lymphs Abs: 1.6 10*3/uL (ref 0.7–4.0)
MCH: 29.8 pg (ref 26.0–34.0)
MCHC: 32.6 g/dL (ref 30.0–36.0)
MCV: 91.5 fL (ref 80.0–100.0)
Monocytes Absolute: 0.6 10*3/uL (ref 0.1–1.0)
Monocytes Relative: 8 %
Neutro Abs: 4.3 10*3/uL (ref 1.7–7.7)
Neutrophils Relative %: 65 %
Platelet Count: 354 10*3/uL (ref 150–400)
RBC: 4.46 MIL/uL (ref 3.87–5.11)
RDW: 15 % (ref 11.5–15.5)
WBC Count: 6.6 10*3/uL (ref 4.0–10.5)
nRBC: 0 % (ref 0.0–0.2)

## 2020-07-13 MED ORDER — ACETAMINOPHEN 325 MG PO TABS
ORAL_TABLET | ORAL | Status: AC
  Filled 2020-07-13: qty 2

## 2020-07-13 MED ORDER — DEXAMETHASONE 4 MG PO TABS
ORAL_TABLET | ORAL | Status: AC
  Filled 2020-07-13: qty 5

## 2020-07-13 MED ORDER — ACETAMINOPHEN 325 MG PO TABS
650.0000 mg | ORAL_TABLET | Freq: Once | ORAL | Status: AC
  Administered 2020-07-13: 650 mg via ORAL

## 2020-07-13 MED ORDER — DIPHENHYDRAMINE HCL 25 MG PO CAPS
50.0000 mg | ORAL_CAPSULE | Freq: Once | ORAL | Status: AC
  Administered 2020-07-13: 50 mg via ORAL

## 2020-07-13 MED ORDER — DIPHENHYDRAMINE HCL 25 MG PO CAPS
ORAL_CAPSULE | ORAL | Status: AC
  Filled 2020-07-13: qty 2

## 2020-07-13 MED ORDER — DEXAMETHASONE 4 MG PO TABS
20.0000 mg | ORAL_TABLET | Freq: Once | ORAL | Status: AC
  Administered 2020-07-13: 20 mg via ORAL

## 2020-07-13 MED ORDER — DARATUMUMAB-HYALURONIDASE-FIHJ 1800-30000 MG-UT/15ML ~~LOC~~ SOLN
1800.0000 mg | Freq: Once | SUBCUTANEOUS | Status: AC
  Administered 2020-07-13: 1800 mg via SUBCUTANEOUS
  Filled 2020-07-13: qty 15

## 2020-07-13 NOTE — Patient Instructions (Signed)
[facility] Discharge Instructions for Patients Receiving Chemotherapy  Today you received the following chemotherapy agent: Daratumumab (Darzalex Faspro)  To help prevent nausea and vomiting after your treatment, we encourage you to take your nausea medication as directed by your MD.   If you develop nausea and vomiting that is not controlled by your nausea medication, call the clinic.   BELOW ARE SYMPTOMS THAT SHOULD BE REPORTED IMMEDIATELY:  *FEVER GREATER THAN 100.5 F  *CHILLS WITH OR WITHOUT FEVER  NAUSEA AND VOMITING THAT IS NOT CONTROLLED WITH YOUR NAUSEA MEDICATION  *UNUSUAL SHORTNESS OF BREATH  *UNUSUAL BRUISING OR BLEEDING  TENDERNESS IN MOUTH AND THROAT WITH OR WITHOUT PRESENCE OF ULCERS  *URINARY PROBLEMS  *BOWEL PROBLEMS  UNUSUAL RASH Items with * indicate a potential emergency and should be followed up as soon as possible.  Feel free to call the clinic should you have any questions or concerns. The clinic phone number is [phone].  Please show the CHEMO ALERT CARD at check-in to the Emergency Department and triage nurse. 

## 2020-07-13 NOTE — Progress Notes (Signed)
Pt. Stable for discharge.  Left via wheelchair, no respiratory distress noted.

## 2020-07-13 NOTE — Progress Notes (Signed)
Hematology and Oncology Follow Up Visit  Nicole Keith [id number] [birth year] 79 y.o. 07/13/2020 9:57 AM Nicole Keith, [name], [name], MD   Principle Diagnosis: 79 year old woman with IgG lambda multiple myeloma presented with 30% plasma cell infiltration arising from MGUS in 2019.  Past therapy:    Velcade and dexamethasone started weekly on 07/27/2018.  Therapy changed in February 2020.  Revlimid 25 mg for 21 days out of a 28-day cycle with dexamethasone started in February 2020.  Therapy was held after 2 cycles because of severe dermatological toxicity.  Pomalyst 1 mg daily for 21 days started on May 1 of 2020.  She completed 4 cycles of therapy.  Current therapy: Ninlaro 4 mg weekly with dexamethasone 20 mg weekly for 3 weeks on and 1 week off started in December 2020.  Daratumumab was added on December 02, 2019.  She is currently receiving daratumumab every other week per starting June 2021.    She will receive daratumumab monthly starting May 18, 2020.  She is here for the next cycle of therapy.  Interim History:  Nicole Keith returns today for a follow-up visit.  Since her last visit, she feels reasonably well without any complaints.  She continues to tolerate daratumumab without any major complications.  She denies any nausea, vomiting or abdominal pain.  She denies any bone pain or pathological fractures.  He denies any infusion related issues.          Medications: Reviewed without changes. Current Outpatient Medications  Medication Sig Dispense Refill  . acetaminophen (TYLENOL) 500 MG tablet Take 500 mg by mouth in the morning, at noon, and at bedtime.     . diphenhydramine-acetaminophen (TYLENOL PM) 25-500 MG TABS tablet Take 2 tablets by mouth at bedtime as needed (for sleep).     . famotidine-calcium carbonate-magnesium hydroxide (PEPCID COMPLETE) 10-800-165 MG chewable tablet Chew 1 tablet by mouth daily as needed (for heartburn or indigestion).     [name] Kitchen GERI-LANTA  200-200-20 MG/5ML suspension Take by mouth.    [name] Kitchen ibuprofen (ADVIL) 400 MG tablet Take 400 mg by mouth every 6 (six) hours.     . magic mouthwash SOLN Take 5 mLs by mouth 3 (three) times daily as needed for mouth pain. (Patient not taking: Reported on 04/06/2020) 30 mL 0  . NINLARO 4 MG capsule TAKE 1 CAPSULE (4 MG) BY MOUTH WEEKLY, 3 WEEKS ON, 1 WEEK OFF, REPEAT EVERY 4 WEEKS. TAKE ON AN EMPTY STOMACH 1HR BEFORE OR 2HR AFTER MEALS 3 capsule 1  . Polyethyl Glycol-Propyl Glycol (SYSTANE) 0.4-0.3 % SOLN Place 1 drop into both eyes in the morning and at bedtime.      No current facility-administered medications for this visit.     Allergies:  Allergies  Allergen Reactions  . Pomalyst [Pomalidomide] Rash and Other (See Comments)    ENTIRE BODY BROKE OUT IN A RASH  . Revlimid [Lenalidomide] Rash and Other (See Comments)    WHOLE BODY BLISTERED  . Betadine [Povidone Iodine] Rash  . Flagyl [Metronidazole] Diarrhea  . Fosamax [Alendronate Sodium] Other (See Comments)    GI distress  . Bactrim [Sulfamethoxazole-Trimethoprim] Nausea Only  . Clindamycin/Lincomycin     Pt can't remember reaction type.  . Doxepin Hcl Other (See Comments)    Irritable, hallucinations  . Hydrocodone-Acetaminophen     Restlessness  . Hydroxyzine Other (See Comments)    Hallucinations  . Hydroxyzine Hcl Other (See Comments)    Hallucinations   . Tramadol Hcl   . Valium [Diazepam]  Other (See Comments)    Reaction not recalled  . Actonel [Risedronate Sodium] Other (See Comments)    GI distress  . Boniva [Ibandronic Acid] Other (See Comments)    GI distress  . Doxycycline Rash  . Penicillins Rash    Ancef was OK      Physical Exam:       Blood pressure 138/80, pulse 83, temperature (!) 97 F (36.1 C), temperature source Tympanic, resp. rate 17, height '5\' 6"'  (1.676 m), weight 136 lb 14.4 oz (62.1 kg), SpO2 99 %.           ECOG: 1   General appearance: Alert, awake without any  distress. Head: Atraumatic without abnormalities Oropharynx: Without any thrush or ulcers. Eyes: No scleral icterus. Lymph nodes: No lymphadenopathy noted in the cervical, supraclavicular, or axillary nodes Heart:regular rate and rhythm, without any murmurs or gallops.   Lung: Clear to auscultation without any rhonchi, wheezes or dullness to percussion. Abdomin: Soft, nontender without any shifting dullness or ascites. Musculoskeletal: No clubbing or cyanosis. Neurological: No motor or sensory deficits. Skin: No rashes or lesions.                      Lab Results: Lab Results  Component Value Date   WBC 6.6 07/13/2020   HGB 13.3 07/13/2020   HCT 40.8 07/13/2020   MCV 91.5 07/13/2020   PLT 354 07/13/2020     Chemistry      Component Value Date/Time   NA 140 07/13/2020 0920   NA 135 (L) 06/11/2017 0753   K 4.2 07/13/2020 0920   K 4.0 06/11/2017 0753   CL 107 07/13/2020 0920   CO2 23 07/13/2020 0920   CO2 26 06/11/2017 0753   BUN 13 07/13/2020 0920   BUN 12.8 06/11/2017 0753   CREATININE 0.74 07/13/2020 0920   CREATININE 0.8 06/11/2017 0753      Component Value Date/Time   CALCIUM 9.0 07/13/2020 0920   CALCIUM 8.8 06/11/2017 0753   ALKPHOS 57 07/13/2020 0920   ALKPHOS 72 06/11/2017 0753   AST 16 07/13/2020 0920   AST 21 06/11/2017 0753   ALT 9 07/13/2020 0920   ALT 13 06/11/2017 0753   BILITOT 0.4 07/13/2020 0920   BILITOT 0.35 06/11/2017 0753      Results for HYDIE, LANGAN (MRN [id number]) as of 07/13/2020 09:59  Ref. Range 05/04/2020 11:28 06/15/2020 11:26  M Protein SerPl Elph-Mcnc Latest Ref Range: Not Observed g/dL 0.5 (H) 0.3 (H)  IFE 1 Unknown Comment (A) Comment (A)  Globulin, Total Latest Ref Range: 2.2 - 3.9 g/dL 2.9 2.5  B-Globulin SerPl Elph-Mcnc Latest Ref Range: 0.7 - 1.3 g/dL 0.9 0.9  IgG (Immunoglobin G), Serum Latest Ref Range: 586 - 1,602 mg/dL 656 543 (L)  IgM (Immunoglobulin M), Srm Latest Ref Range: 26 - 217 mg/dL 25 (L)  21 (L)  IgA Latest Ref Range: 64 - 422 mg/dL 29 (L) 23 (L)    Impression and Plan:  79 year old woman with:  1.  IgG lambda multiple myeloma diagnosed in 2019 arising from MGUS.     She is currently on daratumumab maintenance with the excellent response based on protein studies.  Laboratory data on 06/15/2020 showed an M spike of 0.3 g/dL and decreased IgG, IgM and IgA.  Serum light chains showed normal kappa to lambda ratio.  Risks and benefits of continuing this treatment long-term were reviewed.  Given her excellent response and tolerance will continue with daratumumab maintenance.  2.  Anemia: Related to plasma cell disorder and has resolved at this time.  3. Osteoporosis and compression fracture: She has received Zometa in the past and currently on hold because of dental surgery.  4.  Herpes zoster reactivation: Currently on acyclovir to prevent reactivation.  No issues reported at this time.  5.  Covid vaccine considerations: She has completed the two dose of vaccination series in November 2021.  7. Follow-up: In 4 weeks for the next cycle of therapy.  30  minutes were spent on this visit.  The time was dedicated to reviewing her disease status, discussing treatment options and future plan of care review.  Zola Button, MD 12/3/20219:57 AM

## 2020-07-16 LAB — MULTIPLE MYELOMA PANEL, SERUM
Albumin SerPl Elph-Mcnc: 3.3 g/dL (ref 2.9–4.4)
Albumin/Glob SerPl: 1.5 (ref 0.7–1.7)
Alpha 1: 0.2 g/dL (ref 0.0–0.4)
Alpha2 Glob SerPl Elph-Mcnc: 0.8 g/dL (ref 0.4–1.0)
B-Globulin SerPl Elph-Mcnc: 0.8 g/dL (ref 0.7–1.3)
Gamma Glob SerPl Elph-Mcnc: 0.5 g/dL (ref 0.4–1.8)
Globulin, Total: 2.3 g/dL (ref 2.2–3.9)
IgA: 22 mg/dL — ABNORMAL LOW (ref 64–422)
IgG (Immunoglobin G), Serum: 468 mg/dL — ABNORMAL LOW (ref 586–1602)
IgM (Immunoglobulin M), Srm: 21 mg/dL — ABNORMAL LOW (ref 26–217)
M Protein SerPl Elph-Mcnc: 0.3 g/dL — ABNORMAL HIGH
Total Protein ELP: 5.6 g/dL — ABNORMAL LOW (ref 6.0–8.5)

## 2020-07-16 LAB — KAPPA/LAMBDA LIGHT CHAINS
Kappa free light chain: 2.7 mg/L — ABNORMAL LOW (ref 3.3–19.4)
Kappa, lambda light chain ratio: 0.73 (ref 0.26–1.65)
Lambda free light chains: 3.7 mg/L — ABNORMAL LOW (ref 5.7–26.3)

## 2020-07-30 ENCOUNTER — Telehealth: Payer: Self-pay

## 2020-07-30 NOTE — Telephone Encounter (Signed)
Oral Oncology Patient Advocate Encounter  Received notification from [place] that prior authorization for [name] is required.  PA submitted on CoverMyMeds Key [id number] Status is pending  Oral Oncology Clinic will continue to follow.   [place] Patient [place] Phone [phone] Fax [phone] 07/30/2020 8:51 AM

## 2020-07-30 NOTE — Telephone Encounter (Signed)
Oral Oncology Patient Advocate Encounter  Prior Authorization for Nicole Keith has been approved.    PA# [id number] Effective dates: 07/30/20 through 08/10/21  Oral Oncology Clinic will continue to follow.   [place] Patient [place] Phone [phone] Fax [phone] 07/30/2020 9:04 AM

## 2020-08-09 ENCOUNTER — Inpatient Hospital Stay (HOSPITAL_BASED_OUTPATIENT_CLINIC_OR_DEPARTMENT_OTHER): Payer: Medicare Other | Admitting: Oncology

## 2020-08-09 ENCOUNTER — Inpatient Hospital Stay: Payer: Medicare Other

## 2020-08-09 ENCOUNTER — Telehealth: Payer: Self-pay | Admitting: Oncology

## 2020-08-09 ENCOUNTER — Other Ambulatory Visit: Payer: Self-pay

## 2020-08-09 VITALS — BP 142/84 | HR 73 | Temp 98.1°F | Resp 18 | Ht 66.0 in | Wt 135.1 lb

## 2020-08-09 DIAGNOSIS — C9000 Multiple myeloma not having achieved remission: Secondary | ICD-10-CM

## 2020-08-09 DIAGNOSIS — Z5112 Encounter for antineoplastic immunotherapy: Secondary | ICD-10-CM | POA: Diagnosis not present

## 2020-08-09 LAB — CBC WITH DIFFERENTIAL (CANCER CENTER ONLY)
Abs Immature Granulocytes: 0.01 10*3/uL (ref 0.00–0.07)
Basophils Absolute: 0 10*3/uL (ref 0.0–0.1)
Basophils Relative: 1 %
Eosinophils Absolute: 0.1 10*3/uL (ref 0.0–0.5)
Eosinophils Relative: 1 %
HCT: 40.9 % (ref 36.0–46.0)
Hemoglobin: 13.5 g/dL (ref 12.0–15.0)
Immature Granulocytes: 0 %
Lymphocytes Relative: 29 %
Lymphs Abs: 2 10*3/uL (ref 0.7–4.0)
MCH: 29.9 pg (ref 26.0–34.0)
MCHC: 33 g/dL (ref 30.0–36.0)
MCV: 90.7 fL (ref 80.0–100.0)
Monocytes Absolute: 0.6 10*3/uL (ref 0.1–1.0)
Monocytes Relative: 9 %
Neutro Abs: 4.1 10*3/uL (ref 1.7–7.7)
Neutrophils Relative %: 60 %
Platelet Count: 365 10*3/uL (ref 150–400)
RBC: 4.51 MIL/uL (ref 3.87–5.11)
RDW: 14.7 % (ref 11.5–15.5)
WBC Count: 6.9 10*3/uL (ref 4.0–10.5)
nRBC: 0 % (ref 0.0–0.2)

## 2020-08-09 LAB — CMP (CANCER CENTER ONLY)
ALT: 14 U/L (ref 0–44)
AST: 17 U/L (ref 15–41)
Albumin: 3.7 g/dL (ref 3.5–5.0)
Alkaline Phosphatase: 51 U/L (ref 38–126)
Anion gap: 5 (ref 5–15)
BUN: 8 mg/dL (ref 8–23)
CO2: 27 mmol/L (ref 22–32)
Calcium: 8.9 mg/dL (ref 8.9–10.3)
Chloride: 107 mmol/L (ref 98–111)
Creatinine: 0.72 mg/dL (ref 0.44–1.00)
GFR, Estimated: >60 mL/min (ref >60)
Glucose, Bld: 101 mg/dL — ABNORMAL HIGH (ref 70–99)
Potassium: 4.3 mmol/L (ref 3.5–5.1)
Sodium: 139 mmol/L (ref 135–145)
Total Bilirubin: 0.4 mg/dL (ref 0.3–1.2)
Total Protein: 6.3 g/dL — ABNORMAL LOW (ref 6.5–8.1)

## 2020-08-09 MED ORDER — ACETAMINOPHEN 325 MG PO TABS
650.0000 mg | ORAL_TABLET | Freq: Once | ORAL | Status: AC
  Administered 2020-08-09: 650 mg via ORAL

## 2020-08-09 MED ORDER — SODIUM CHLORIDE 0.9% FLUSH
10.0000 mL | INTRAVENOUS | Status: DC | PRN
  Filled 2020-08-09: qty 10

## 2020-08-09 MED ORDER — ACETAMINOPHEN 325 MG PO TABS
ORAL_TABLET | ORAL | Status: AC
  Filled 2020-08-09: qty 2

## 2020-08-09 MED ORDER — HEPARIN SOD (PORK) LOCK FLUSH 100 UNIT/ML IV SOLN
500.0000 [IU] | Freq: Once | INTRAVENOUS | Status: DC | PRN
  Filled 2020-08-09: qty 5

## 2020-08-09 MED ORDER — DARATUMUMAB-HYALURONIDASE-FIHJ 1800-30000 MG-UT/15ML ~~LOC~~ SOLN
1800.0000 mg | Freq: Once | SUBCUTANEOUS | Status: AC
  Administered 2020-08-09: 14:00:00 1800 mg via SUBCUTANEOUS
  Filled 2020-08-09: qty 15

## 2020-08-09 MED ORDER — DEXAMETHASONE 4 MG PO TABS
ORAL_TABLET | ORAL | Status: AC
  Filled 2020-08-09: qty 5

## 2020-08-09 MED ORDER — DEXAMETHASONE 4 MG PO TABS
20.0000 mg | ORAL_TABLET | Freq: Once | ORAL | Status: AC
  Administered 2020-08-09: 12:00:00 20 mg via ORAL

## 2020-08-09 MED ORDER — DIPHENHYDRAMINE HCL 25 MG PO CAPS
ORAL_CAPSULE | ORAL | Status: AC
  Filled 2020-08-09: qty 2

## 2020-08-09 MED ORDER — SODIUM CHLORIDE 0.9 % IV SOLN
Freq: Once | INTRAVENOUS | Status: DC
  Filled 2020-08-09: qty 250

## 2020-08-09 MED ORDER — DIPHENHYDRAMINE HCL 25 MG PO CAPS
50.0000 mg | ORAL_CAPSULE | Freq: Once | ORAL | Status: AC
  Administered 2020-08-09: 50 mg via ORAL

## 2020-08-09 NOTE — Telephone Encounter (Signed)
Left message with rescheduled appointment and added infusion per 12/30 los. Gave option to call back to reschedule if needed.

## 2020-08-09 NOTE — Patient Instructions (Signed)
[facility] Discharge Instructions for Patients Receiving Chemotherapy  Today you received the following chemotherapy agents Daratumumab-hyaluronidase-fihj (DARZALEX FASPRO).  To help prevent nausea and vomiting after your treatment, we encourage you to take your nausea medication as prescribed.   If you develop nausea and vomiting that is not controlled by your nausea medication, call the clinic.   BELOW ARE SYMPTOMS THAT SHOULD BE REPORTED IMMEDIATELY:  *FEVER GREATER THAN 100.5 F  *CHILLS WITH OR WITHOUT FEVER  NAUSEA AND VOMITING THAT IS NOT CONTROLLED WITH YOUR NAUSEA MEDICATION  *UNUSUAL SHORTNESS OF BREATH  *UNUSUAL BRUISING OR BLEEDING  TENDERNESS IN MOUTH AND THROAT WITH OR WITHOUT PRESENCE OF ULCERS  *URINARY PROBLEMS  *BOWEL PROBLEMS  UNUSUAL RASH Items with * indicate a potential emergency and should be followed up as soon as possible.  Feel free to call the clinic should you have any questions or concerns. The clinic phone number is [phone].  Please show the CHEMO ALERT CARD at check-in to the Emergency Department and triage nurse.   

## 2020-08-09 NOTE — Progress Notes (Signed)
Hematology and Oncology Follow Up Visit  Nicole Keith [id number] [birth year] 79 y.o. 08/09/2020 11:27 AM Nicole Keith, [name], [name], Keith   Principle Diagnosis: 79 year old woman with multiple myeloma diagnosed in 2019.  She was found to have IgG lambda arising from MGUS.   Past therapy:    Velcade and dexamethasone started weekly on 07/27/2018.  Therapy changed in February 2020.  Revlimid 25 mg for 21 days out of a 28-day cycle with dexamethasone started in February 2020.  Therapy was held after 2 cycles because of severe dermatological toxicity.  Pomalyst 1 mg daily for 21 days started on May 1 of 2020.  She completed 4 cycles of therapy.  Current therapy: Ninlaro 4 mg weekly with dexamethasone 20 mg weekly for 3 weeks on and 1 week off started in December 2020.  Daratumumab was added on December 02, 2019.  She is currently receiving daratumumab every other week per starting June 2021.    She will receive daratumumab monthly starting May 18, 2020.  She will receive the next cycle of therapy today.  Interim History:  Nicole Keith presents today for a follow-up evaluation.  Since the last visit, she reports no major changes in her health.  She denies any nausea, vomiting or abdominal pain.  She does report occasional pruritus and skin rash of unclear etiology.  She tolerates daratumumab without any infusion related issues.  She denies any worsening bone pain or pathological fractures.  Performance status quality of life remain excellent.          Medications: Unchanged on review. Current Outpatient Medications  Medication Sig Dispense Refill  . acetaminophen (TYLENOL) 500 MG tablet Take 500 mg by mouth in the morning, at noon, and at bedtime.     . diphenhydramine-acetaminophen (TYLENOL PM) 25-500 MG TABS tablet Take 2 tablets by mouth at bedtime as needed (for sleep).     . famotidine-calcium carbonate-magnesium hydroxide (PEPCID COMPLETE) 10-800-165 MG chewable tablet Chew  1 tablet by mouth daily as needed (for heartburn or indigestion).     Keith Kitchen ibuprofen (ADVIL) 400 MG tablet Take 400 mg by mouth every 6 (six) hours.     . magic mouthwash SOLN Take 5 mLs by mouth 3 (three) times daily as needed for mouth pain. (Patient not taking: Reported on 04/06/2020) 30 mL 0  . Polyethyl Glycol-Propyl Glycol (SYSTANE) 0.4-0.3 % SOLN Place 1 drop into both eyes in the morning and at bedtime.      No current facility-administered medications for this visit.     Allergies:  Allergies  Allergen Reactions  . Pomalyst [Pomalidomide] Rash and Other (See Comments)    ENTIRE BODY BROKE OUT IN A RASH  . Revlimid [Lenalidomide] Rash and Other (See Comments)    WHOLE BODY BLISTERED  . Betadine [Povidone Iodine] Rash  . Flagyl [Metronidazole] Diarrhea  . Fosamax [Alendronate Sodium] Other (See Comments)    GI distress  . Bactrim [Sulfamethoxazole-Trimethoprim] Nausea Only  . Clindamycin/Lincomycin     Pt can't remember reaction type.  . Doxepin Hcl Other (See Comments)    Irritable, hallucinations  . Hydrocodone-Acetaminophen     Restlessness  . Hydroxyzine Other (See Comments)    Hallucinations  . Hydroxyzine Hcl Other (See Comments)    Hallucinations   . Tramadol Hcl   . Valium [Diazepam] Other (See Comments)    Reaction not recalled  . Actonel [Risedronate Sodium] Other (See Comments)    GI distress  . Boniva [Ibandronic Acid] Other (See Comments)  GI distress  . Doxycycline Rash  . Penicillins Rash    Ancef was OK      Physical Exam:  Blood pressure (!) 142/84, pulse 73, temperature 98.1 F (36.7 C), temperature source Tympanic, resp. rate 18, height _0  (1.676 m), weight 135 lb 1.6 oz (61.3 kg), SpO2 99 %.   ECOG: 1    General appearance: Comfortable appearing without any discomfort Head: Normocephalic without any trauma Oropharynx: Mucous membranes are moist and pink without any thrush or ulcers. Eyes: Pupils are equal and round reactive to  light. Lymph nodes: No cervical, supraclavicular, inguinal or axillary lymphadenopathy.   Heart:regular rate and rhythm.  S1 and S2 without leg edema. Lung: Clear without any rhonchi or wheezes.  No dullness to percussion. Abdomin: Soft, nontender, nondistended with good bowel sounds.  No hepatosplenomegaly. Musculoskeletal: No joint deformity or effusion.  Full range of motion noted. Neurological: No deficits noted on motor, sensory and deep tendon reflex exam. Skin: No petechial rash or dryness.  Appeared moist.                       Lab Results: Lab Results  Component Value Date   WBC 6.6 07/13/2020   HGB 13.3 07/13/2020   HCT 40.8 07/13/2020   MCV 91.5 07/13/2020   PLT 354 07/13/2020     Chemistry      Component Value Date/Time   NA 140 07/13/2020 0920   NA 135 (L) 06/11/2017 0753   K 4.2 07/13/2020 0920   K 4.0 06/11/2017 0753   CL 107 07/13/2020 0920   CO2 23 07/13/2020 0920   CO2 26 06/11/2017 0753   BUN 13 07/13/2020 0920   BUN 12.8 06/11/2017 0753   CREATININE 0.74 07/13/2020 0920   CREATININE 0.8 06/11/2017 0753      Component Value Date/Time   CALCIUM 9.0 07/13/2020 0920   CALCIUM 8.8 06/11/2017 0753   ALKPHOS 57 07/13/2020 0920   ALKPHOS 72 06/11/2017 0753   AST 16 07/13/2020 0920   AST 21 06/11/2017 0753   ALT 9 07/13/2020 0920   ALT 13 06/11/2017 0753   BILITOT 0.4 07/13/2020 0920   BILITOT 0.35 06/11/2017 0753       Results for Nicole Keith (MRN [id number]) as of 08/09/2020 11:28  Ref. Range 06/15/2020 11:26 07/13/2020 09:21  M Protein SerPl Elph-Mcnc Latest Ref Range: Not Observed g/dL 0.3 (H) 0.3 (H)  IFE 1 Unknown Comment (A) Comment (A)  Globulin, Total Latest Ref Range: 2.2 - 3.9 g/dL 2.5 2.3  B-Globulin SerPl Elph-Mcnc Latest Ref Range: 0.7 - 1.3 g/dL 0.9 0.8  IgG (Immunoglobin G), Serum Latest Ref Range: 586 - 1,602 mg/dL 543 (L) 468 (L)  IgM (Immunoglobulin M), Srm Latest Ref Range: 26 - 217 mg/dL 21 (L) 21 (L)  IgA  Latest Ref Range: 64 - 422 mg/dL 23 (L) 22 (L)   Results for Nicole Keith (MRN [id number]) as of 08/09/2020 11:28  Ref. Range 07/13/2020 09:20  Kappa free light chain Latest Ref Range: 3.3 - 19.4 mg/L 2.7 (L)  Lamda free light chains Latest Ref Range: 5.7 - 26.3 mg/L 3.7 (L)  Kappa, lamda light chain ratio Latest Ref Range: 0.26 - 1.65  0.73    Impression and Plan:  79 year old woman with:  1.  Multiple myeloma arising from MGUS diagnosed in 2019.  She has IgG lambda subtype.   She continues to tolerate her current therapy dominantly with daratumumab maintenance without any complaints.  Protein studies obtained on December 3 reviewed at that time showed no M spike currently at 0.3 g/dL and depressed immunoglobulins.  Her kappa to lambda light chains are normal.  Risks and benefits of continuing this treatment for the time being were reviewed.  Potential complications were also reiterated.  She is agreeable to proceed at this time.  2.  Anemia: Related to plasma cell disorder and has resolved at this time.  3. Osteoporosis and compression fracture: She has received Zometa in the past and will resume in the future once her dental surgery is complete.  4.  Herpes zoster reactivation: No reactivation noted and currently on acyclovir.  5.  Covid vaccine considerations: She is up-to-date and completing her vaccination series in November 2021.  A booster will likely be required in 3 to 6 months.  7. Follow-up: She will return in 1 month for a follow-up evaluation.  30  minutes were dedicated to this encounter.  The time was spent on reviewing disease status, discussing treatment options and complications related to therapy.  Zola Button, Keith 12/30/202111:27 AM

## 2020-08-13 LAB — KAPPA/LAMBDA LIGHT CHAINS
Kappa free light chain: 4.4 mg/L (ref 3.3–19.4)
Kappa, lambda light chain ratio: 1.47 (ref 0.26–1.65)
Lambda free light chains: 3 mg/L — ABNORMAL LOW (ref 5.7–26.3)

## 2020-08-13 LAB — MULTIPLE MYELOMA PANEL, SERUM
Albumin SerPl Elph-Mcnc: 3.6 g/dL (ref 2.9–4.4)
Albumin/Glob SerPl: 1.7 (ref 0.7–1.7)
Alpha 1: 0.2 g/dL (ref 0.0–0.4)
Alpha2 Glob SerPl Elph-Mcnc: 0.8 g/dL (ref 0.4–1.0)
B-Globulin SerPl Elph-Mcnc: 0.8 g/dL (ref 0.7–1.3)
Gamma Glob SerPl Elph-Mcnc: 0.4 g/dL (ref 0.4–1.8)
Globulin, Total: 2.2 g/dL (ref 2.2–3.9)
IgA: 24 mg/dL — ABNORMAL LOW (ref 64–422)
IgG (Immunoglobin G), Serum: 428 mg/dL — ABNORMAL LOW (ref 586–1602)
IgM (Immunoglobulin M), Srm: 22 mg/dL — ABNORMAL LOW (ref 26–217)
M Protein SerPl Elph-Mcnc: 0.1 g/dL — ABNORMAL HIGH
Total Protein ELP: 5.8 g/dL — ABNORMAL LOW (ref 6.0–8.5)

## 2020-09-04 ENCOUNTER — Inpatient Hospital Stay: Payer: Medicare Other | Attending: Oncology

## 2020-09-04 ENCOUNTER — Other Ambulatory Visit: Payer: Self-pay | Admitting: *Deleted

## 2020-09-04 ENCOUNTER — Inpatient Hospital Stay (HOSPITAL_BASED_OUTPATIENT_CLINIC_OR_DEPARTMENT_OTHER): Payer: Medicare Other | Admitting: Oncology

## 2020-09-04 ENCOUNTER — Inpatient Hospital Stay: Payer: Medicare Other

## 2020-09-04 ENCOUNTER — Ambulatory Visit: Payer: Medicare Other

## 2020-09-04 ENCOUNTER — Ambulatory Visit: Payer: Medicare Other | Admitting: Oncology

## 2020-09-04 ENCOUNTER — Other Ambulatory Visit: Payer: Self-pay

## 2020-09-04 VITALS — BP 128/85 | HR 89 | Temp 98.1°F | Resp 18 | Ht 66.0 in | Wt 132.6 lb

## 2020-09-04 DIAGNOSIS — Z79899 Other long term (current) drug therapy: Secondary | ICD-10-CM | POA: Insufficient documentation

## 2020-09-04 DIAGNOSIS — C9000 Multiple myeloma not having achieved remission: Secondary | ICD-10-CM

## 2020-09-04 DIAGNOSIS — Z5111 Encounter for antineoplastic chemotherapy: Secondary | ICD-10-CM | POA: Diagnosis not present

## 2020-09-04 DIAGNOSIS — Z5112 Encounter for antineoplastic immunotherapy: Secondary | ICD-10-CM | POA: Diagnosis present

## 2020-09-04 LAB — CBC WITH DIFFERENTIAL (CANCER CENTER ONLY)
Abs Immature Granulocytes: 0.02 10*3/uL (ref 0.00–0.07)
Basophils Absolute: 0 10*3/uL (ref 0.0–0.1)
Basophils Relative: 1 %
Eosinophils Absolute: 0.1 10*3/uL (ref 0.0–0.5)
Eosinophils Relative: 1 %
HCT: 43.1 % (ref 36.0–46.0)
Hemoglobin: 13.9 g/dL (ref 12.0–15.0)
Immature Granulocytes: 0 %
Lymphocytes Relative: 27 %
Lymphs Abs: 2 10*3/uL (ref 0.7–4.0)
MCH: 29.8 pg (ref 26.0–34.0)
MCHC: 32.3 g/dL (ref 30.0–36.0)
MCV: 92.3 fL (ref 80.0–100.0)
Monocytes Absolute: 0.6 10*3/uL (ref 0.1–1.0)
Monocytes Relative: 8 %
Neutro Abs: 4.7 10*3/uL (ref 1.7–7.7)
Neutrophils Relative %: 63 %
Platelet Count: 413 10*3/uL — ABNORMAL HIGH (ref 150–400)
RBC: 4.67 MIL/uL (ref 3.87–5.11)
RDW: 14.3 % (ref 11.5–15.5)
WBC Count: 7.4 10*3/uL (ref 4.0–10.5)
nRBC: 0 % (ref 0.0–0.2)

## 2020-09-04 LAB — CMP (CANCER CENTER ONLY)
ALT: 13 U/L (ref 0–44)
AST: 16 U/L (ref 15–41)
Albumin: 3.9 g/dL (ref 3.5–5.0)
Alkaline Phosphatase: 63 U/L (ref 38–126)
Anion gap: 8 (ref 5–15)
BUN: 11 mg/dL (ref 8–23)
CO2: 28 mmol/L (ref 22–32)
Calcium: 9.4 mg/dL (ref 8.9–10.3)
Chloride: 102 mmol/L (ref 98–111)
Creatinine: 0.8 mg/dL (ref 0.44–1.00)
GFR, Estimated: >60 mL/min (ref >60)
Glucose, Bld: 94 mg/dL (ref 70–99)
Potassium: 4.3 mmol/L (ref 3.5–5.1)
Sodium: 138 mmol/L (ref 135–145)
Total Bilirubin: 0.4 mg/dL (ref 0.3–1.2)
Total Protein: 6.4 g/dL — ABNORMAL LOW (ref 6.5–8.1)

## 2020-09-04 MED ORDER — ACETAMINOPHEN 325 MG PO TABS
650.0000 mg | ORAL_TABLET | Freq: Once | ORAL | Status: AC
  Administered 2020-09-04: 650 mg via ORAL

## 2020-09-04 MED ORDER — TRIAMCINOLONE ACETONIDE 0.1 % EX CREA: 1.0000 "application " | TOPICAL_CREAM | Freq: Two times a day (BID) | CUTANEOUS | 3 refills | Status: DC

## 2020-09-04 MED ORDER — DIPHENHYDRAMINE HCL 25 MG PO CAPS
50.0000 mg | ORAL_CAPSULE | Freq: Once | ORAL | Status: AC
  Administered 2020-09-04: 50 mg via ORAL

## 2020-09-04 MED ORDER — DEXAMETHASONE 4 MG PO TABS
20.0000 mg | ORAL_TABLET | Freq: Once | ORAL | Status: AC
  Administered 2020-09-04: 20 mg via ORAL

## 2020-09-04 MED ORDER — DEXAMETHASONE 4 MG PO TABS
ORAL_TABLET | ORAL | Status: AC
  Filled 2020-09-04: qty 5

## 2020-09-04 MED ORDER — DARATUMUMAB-HYALURONIDASE-FIHJ 1800-30000 MG-UT/15ML ~~LOC~~ SOLN
1800.0000 mg | Freq: Once | SUBCUTANEOUS | Status: AC
  Administered 2020-09-04: 1800 mg via SUBCUTANEOUS
  Filled 2020-09-04: qty 15

## 2020-09-04 MED ORDER — ACETAMINOPHEN 325 MG PO TABS
ORAL_TABLET | ORAL | Status: AC
  Filled 2020-09-04: qty 2

## 2020-09-04 MED ORDER — DIPHENHYDRAMINE HCL 25 MG PO CAPS
ORAL_CAPSULE | ORAL | Status: AC
  Filled 2020-09-04: qty 2

## 2020-09-04 MED FILL — TRIAMCINOLONE 0.1% CREAM: 0.1 | 15 days supply | Qty: 30 | Fill #0

## 2020-09-04 NOTE — Patient Instructions (Signed)
[facility] Discharge Instructions for Patients Receiving Chemotherapy  Today you received the following chemotherapy agent: Daratumumab (Darzalex Faspro)  To help prevent nausea and vomiting after your treatment, we encourage you to take your nausea medication as directed by your MD.   If you develop nausea and vomiting that is not controlled by your nausea medication, call the clinic.   BELOW ARE SYMPTOMS THAT SHOULD BE REPORTED IMMEDIATELY:  *FEVER GREATER THAN 100.5 F  *CHILLS WITH OR WITHOUT FEVER  NAUSEA AND VOMITING THAT IS NOT CONTROLLED WITH YOUR NAUSEA MEDICATION  *UNUSUAL SHORTNESS OF BREATH  *UNUSUAL BRUISING OR BLEEDING  TENDERNESS IN MOUTH AND THROAT WITH OR WITHOUT PRESENCE OF ULCERS  *URINARY PROBLEMS  *BOWEL PROBLEMS  UNUSUAL RASH Items with * indicate a potential emergency and should be followed up as soon as possible.  Feel free to call the clinic should you have any questions or concerns. The clinic phone number is [phone].  Please show the CHEMO ALERT CARD at check-in to the Emergency Department and triage nurse. 

## 2020-09-04 NOTE — Progress Notes (Signed)
Hematology and Oncology Follow Up Visit  Nicole Keith [id number] Feb 19, 1941 80 y.o. 09/04/2020 11:47 AM [name], [name], [name], MD   Principle Diagnosis: 80 year old woman with IgG lambda multiple myeloma diagnosed in 2019 after presenting with worsening anemia arising from MGUS.   Past therapy:    Velcade and dexamethasone started weekly on 07/27/2018.  Therapy changed in February 2020.  Revlimid 25 mg for 21 days out of a 28-day cycle with dexamethasone started in February 2020.  Therapy was held after 2 cycles because of severe dermatological toxicity.  Pomalyst 1 mg daily for 21 days started on May 1 of 2020.  She completed 4 cycles of therapy.  Current therapy: Ninlaro 4 mg weekly with dexamethasone 20 mg weekly for 3 weeks on and 1 week off started in December 2020.  Daratumumab was added on December 02, 2019.  She is currently receiving daratumumab every other week per starting June 2021.    She is currently on maintenance daratumumab monthly starting May 18, 2020.   Interim History:  Nicole Keith returns today for repeat follow-up.  Since the last visit, she reports no major complaints she has completed that root canal procedure without any complications at this time.  She does report diffuse erythematous rash that has been recurring periodically.  She denies any nausea, vomiting or abdominal pain.  She denies any weight loss or appetite changes.  She denies any bone pain or pathological fractures.          Medications: Without changes on review. Current Outpatient Medications  Medication Sig Dispense Refill  . acetaminophen (TYLENOL) 500 MG tablet Take 500 mg by mouth in the morning, at noon, and at bedtime.     . diphenhydramine-acetaminophen (TYLENOL PM) 25-500 MG TABS tablet Take 2 tablets by mouth at bedtime as needed (for sleep).     . famotidine-calcium carbonate-magnesium hydroxide (PEPCID COMPLETE) 10-800-165 MG chewable tablet Chew 1 tablet by mouth  daily as needed (for heartburn or indigestion).     [name] Kitchen ibuprofen (ADVIL) 400 MG tablet Take 400 mg by mouth every 6 (six) hours.     . magic mouthwash SOLN Take 5 mLs by mouth 3 (three) times daily as needed for mouth pain. (Patient not taking: Reported on 04/06/2020) 30 mL 0  . Polyethyl Glycol-Propyl Glycol (SYSTANE) 0.4-0.3 % SOLN Place 1 drop into both eyes in the morning and at bedtime.      No current facility-administered medications for this visit.     Allergies:  Allergies  Allergen Reactions  . Pomalyst [Pomalidomide] Rash and Other (See Comments)    ENTIRE BODY BROKE OUT IN A RASH  . Revlimid [Lenalidomide] Rash and Other (See Comments)    WHOLE BODY BLISTERED  . Betadine [Povidone Iodine] Rash  . Flagyl [Metronidazole] Diarrhea  . Fosamax [Alendronate Sodium] Other (See Comments)    GI distress  . Bactrim [Sulfamethoxazole-Trimethoprim] Nausea Only  . Clindamycin/Lincomycin     Pt can't remember reaction type.  . Doxepin Hcl Other (See Comments)    Irritable, hallucinations  . Hydrocodone-Acetaminophen     Restlessness  . Hydroxyzine Other (See Comments)    Hallucinations  . Hydroxyzine Hcl Other (See Comments)    Hallucinations   . Tramadol Hcl   . Valium [Diazepam] Other (See Comments)    Reaction not recalled  . Actonel [Risedronate Sodium] Other (See Comments)    GI distress  . Boniva [Ibandronic Acid] Other (See Comments)    GI distress  . Doxycycline Rash  .  Penicillins Rash    Ancef was OK      Physical Exam:  Blood pressure 128/85, pulse 89, temperature 98.1 F (36.7 C), temperature source Tympanic, resp. rate 18, height _0  (1.676 m), weight 132 lb 9.6 oz (60.1 kg), SpO2 98 %.    ECOG: 1   General appearance: Alert, awake without any distress. Head: Atraumatic without abnormalities Oropharynx: Without any thrush or ulcers. Eyes: No scleral icterus. Lymph nodes: No lymphadenopathy noted in the cervical, supraclavicular, or axillary  nodes Heart:regular rate and rhythm, without any murmurs or gallops.   Lung: Clear to auscultation without any rhonchi, wheezes or dullness to percussion. Abdomin: Soft, nontender without any shifting dullness or ascites. Musculoskeletal: No clubbing or cyanosis. Neurological: No motor or sensory deficits. Skin: No rashes or lesions.                      Lab Results: Lab Results  Component Value Date   WBC 6.9 08/09/2020   HGB 13.5 08/09/2020   HCT 40.9 08/09/2020   MCV 90.7 08/09/2020   PLT 365 08/09/2020     Chemistry      Component Value Date/Time   NA 139 08/09/2020 1119   NA 135 (L) 06/11/2017 0753   K 4.3 08/09/2020 1119   K 4.0 06/11/2017 0753   CL 107 08/09/2020 1119   CO2 27 08/09/2020 1119   CO2 26 06/11/2017 0753   BUN 8 08/09/2020 1119   BUN 12.8 06/11/2017 0753   CREATININE 0.72 08/09/2020 1119   CREATININE 0.8 06/11/2017 0753      Component Value Date/Time   CALCIUM 8.9 08/09/2020 1119   CALCIUM 8.8 06/11/2017 0753   ALKPHOS 51 08/09/2020 1119   ALKPHOS 72 06/11/2017 0753   AST 17 08/09/2020 1119   AST 21 06/11/2017 0753   ALT 14 08/09/2020 1119   ALT 13 06/11/2017 0753   BILITOT 0.4 08/09/2020 1119   BILITOT 0.35 06/11/2017 0753      Results for SHERENE, PLANCARTE (MRN [id number]) as of 09/04/2020 11:50  Ref. Range 07/13/2020 09:21 08/09/2020 11:19  M Protein SerPl Elph-Mcnc Latest Ref Range: Not Observed g/dL 0.3 (H) 0.1 (H)  IFE 1 Unknown Comment (A) Comment (A)  Globulin, Total Latest Ref Range: 2.2 - 3.9 g/dL 2.3 2.2  B-Globulin SerPl Elph-Mcnc Latest Ref Range: 0.7 - 1.3 g/dL 0.8 0.8  IgG (Immunoglobin G), Serum Latest Ref Range: 586 - 1,602 mg/dL 468 (L) 428 (L)  IgM (Immunoglobulin M), Srm Latest Ref Range: 26 - 217 mg/dL 21 (L) 22 (L)  IgA Latest Ref Range: 64 - 422 mg/dL 22 (L) 24 (L)   Results for VERNEL, DONLAN (MRN [id number]) as of 09/04/2020 11:50  Ref. Range 07/13/2020 09:20 08/09/2020 11:19  Kappa free light  chain Latest Ref Range: 3.3 - 19.4 mg/L 2.7 (L) 4.4  Lamda free light chains Latest Ref Range: 5.7 - 26.3 mg/L 3.7 (L) 3.0 (L)  Kappa, lamda light chain ratio Latest Ref Range: 0.26 - 1.65  0.73 1.47     Impression and Plan:  80 year old woman with:  1.  IgG lambda multiple myeloma arising from MGUS diagnosed in 2019.  The natural course of her disease was updated at this time.  Protein studies on December 30 were reviewed and showed a near complete response with very little residual M spike noted at 0.1 g/dL.  Kappa to lambda ratio has normalized as well.  She is currently on daratumumab maintenance and continues to  tolerate it very well.  Risks and benefits of continuing this treatment were discussed.  Different salvage therapy options were also reviewed in case he develop progression of disease.  At this time I recommended continuing same dose and schedule.  Therapy discontinuation could be considered when she achieves a complete response.  She is agreeable to continue at this time.   2. Osteoporosis and compression fracture: Risks and benefits of continuing Zometa were discussed.  She has been receiving it every 3 months and therapy on hold because of dental issues.   3.  Covid vaccine considerations: She has completed the 2 vaccination series in November 2021.  Vaccination booster will be given at the 77-monthinterval given her immunocompromise.  4.  Rash: Unclear etiology at this time.  I do not believe this is related to daratumumab.  I asked her to see dermatology regarding this issue.  5. Follow-up: In 4 weeks for the next cycle of therapy.  30  minutes were spent on this visit.  The time was dedicated to reviewing laboratory data, disease status update and future plan of care discussions.  FZola Button MD 1/25/202211:47 AM

## 2020-09-05 LAB — KAPPA/LAMBDA LIGHT CHAINS
Kappa free light chain: 4.4 mg/L (ref 3.3–19.4)
Kappa, lambda light chain ratio: 1.29 (ref 0.26–1.65)
Lambda free light chains: 3.4 mg/L — ABNORMAL LOW (ref 5.7–26.3)

## 2020-09-06 ENCOUNTER — Other Ambulatory Visit: Payer: Medicare Other

## 2020-09-06 ENCOUNTER — Ambulatory Visit: Payer: Medicare Other

## 2020-09-06 ENCOUNTER — Ambulatory Visit: Payer: Medicare Other | Admitting: Oncology

## 2020-09-06 LAB — MULTIPLE MYELOMA PANEL, SERUM
Albumin SerPl Elph-Mcnc: 3.5 g/dL (ref 2.9–4.4)
Albumin/Glob SerPl: 1.5 (ref 0.7–1.7)
Alpha 1: 0.3 g/dL (ref 0.0–0.4)
Alpha2 Glob SerPl Elph-Mcnc: 0.8 g/dL (ref 0.4–1.0)
B-Globulin SerPl Elph-Mcnc: 0.9 g/dL (ref 0.7–1.3)
Gamma Glob SerPl Elph-Mcnc: 0.4 g/dL (ref 0.4–1.8)
Globulin, Total: 2.4 g/dL (ref 2.2–3.9)
IgA: 25 mg/dL — ABNORMAL LOW (ref 64–422)
IgG (Immunoglobin G), Serum: 424 mg/dL — ABNORMAL LOW (ref 586–1602)
IgM (Immunoglobulin M), Srm: 23 mg/dL — ABNORMAL LOW (ref 26–217)
M Protein SerPl Elph-Mcnc: 0.1 g/dL — ABNORMAL HIGH
Total Protein ELP: 5.9 g/dL — ABNORMAL LOW (ref 6.0–8.5)

## 2020-09-07 ENCOUNTER — Ambulatory Visit: Payer: Medicare Other | Admitting: Oncology

## 2020-09-07 ENCOUNTER — Ambulatory Visit: Payer: Medicare Other

## 2020-09-07 ENCOUNTER — Other Ambulatory Visit: Payer: Medicare Other

## 2020-09-26 DIAGNOSIS — Z961 Presence of intraocular lens: Secondary | ICD-10-CM | POA: Diagnosis not present

## 2020-10-05 ENCOUNTER — Other Ambulatory Visit: Payer: Self-pay

## 2020-10-05 ENCOUNTER — Inpatient Hospital Stay: Payer: Medicare Other | Attending: Oncology

## 2020-10-05 ENCOUNTER — Inpatient Hospital Stay: Payer: Medicare Other

## 2020-10-05 ENCOUNTER — Inpatient Hospital Stay: Payer: Medicare Other | Admitting: Oncology

## 2020-10-05 ENCOUNTER — Ambulatory Visit: Payer: Medicare Other | Admitting: Oncology

## 2020-10-05 ENCOUNTER — Ambulatory Visit: Payer: Medicare Other

## 2020-10-05 ENCOUNTER — Other Ambulatory Visit: Payer: Medicare Other

## 2020-10-05 VITALS — BP 131/77 | HR 74 | Temp 97.8°F | Resp 14 | Ht 66.0 in | Wt 134.3 lb

## 2020-10-05 DIAGNOSIS — Z79899 Other long term (current) drug therapy: Secondary | ICD-10-CM | POA: Diagnosis not present

## 2020-10-05 DIAGNOSIS — Z5112 Encounter for antineoplastic immunotherapy: Secondary | ICD-10-CM | POA: Insufficient documentation

## 2020-10-05 DIAGNOSIS — C9000 Multiple myeloma not having achieved remission: Secondary | ICD-10-CM | POA: Insufficient documentation

## 2020-10-05 LAB — CBC WITH DIFFERENTIAL (CANCER CENTER ONLY)
Abs Immature Granulocytes: 0.03 10*3/uL (ref 0.00–0.07)
Basophils Absolute: 0.1 10*3/uL (ref 0.0–0.1)
Basophils Relative: 1 %
Eosinophils Absolute: 0.1 10*3/uL (ref 0.0–0.5)
Eosinophils Relative: 2 %
HCT: 41.4 % (ref 36.0–46.0)
Hemoglobin: 13.4 g/dL (ref 12.0–15.0)
Immature Granulocytes: 0 %
Lymphocytes Relative: 26 %
Lymphs Abs: 2 10*3/uL (ref 0.7–4.0)
MCH: 30.5 pg (ref 26.0–34.0)
MCHC: 32.4 g/dL (ref 30.0–36.0)
MCV: 94.1 fL (ref 80.0–100.0)
Monocytes Absolute: 0.6 10*3/uL (ref 0.1–1.0)
Monocytes Relative: 8 %
Neutro Abs: 4.9 10*3/uL (ref 1.7–7.7)
Neutrophils Relative %: 63 %
Platelet Count: 382 10*3/uL (ref 150–400)
RBC: 4.4 MIL/uL (ref 3.87–5.11)
RDW: 13.9 % (ref 11.5–15.5)
WBC Count: 7.8 10*3/uL (ref 4.0–10.5)
nRBC: 0 % (ref 0.0–0.2)

## 2020-10-05 LAB — CMP (CANCER CENTER ONLY)
ALT: 14 U/L (ref 0–44)
AST: 16 U/L (ref 15–41)
Albumin: 3.8 g/dL (ref 3.5–5.0)
Alkaline Phosphatase: 59 U/L (ref 38–126)
Anion gap: 8 (ref 5–15)
BUN: 9 mg/dL (ref 8–23)
CO2: 27 mmol/L (ref 22–32)
Calcium: 9 mg/dL (ref 8.9–10.3)
Chloride: 104 mmol/L (ref 98–111)
Creatinine: 0.75 mg/dL (ref 0.44–1.00)
GFR, Estimated: >60 mL/min (ref >60)
Glucose, Bld: 96 mg/dL (ref 70–99)
Potassium: 4.2 mmol/L (ref 3.5–5.1)
Sodium: 139 mmol/L (ref 135–145)
Total Bilirubin: 0.3 mg/dL (ref 0.3–1.2)
Total Protein: 6.1 g/dL — ABNORMAL LOW (ref 6.5–8.1)

## 2020-10-05 MED ORDER — DIPHENHYDRAMINE HCL 25 MG PO CAPS
50.0000 mg | ORAL_CAPSULE | Freq: Once | ORAL | Status: AC
  Administered 2020-10-05: 50 mg via ORAL

## 2020-10-05 MED ORDER — DIPHENHYDRAMINE HCL 50 MG/ML IJ SOLN
INTRAMUSCULAR | Status: AC
  Filled 2020-10-05: qty 1

## 2020-10-05 MED ORDER — DIPHENHYDRAMINE HCL 25 MG PO CAPS
ORAL_CAPSULE | ORAL | Status: AC
  Filled 2020-10-05: qty 2

## 2020-10-05 MED ORDER — DARATUMUMAB-HYALURONIDASE-FIHJ 1800-30000 MG-UT/15ML ~~LOC~~ SOLN
1800.0000 mg | Freq: Once | SUBCUTANEOUS | Status: AC
  Administered 2020-10-05: 1800 mg via SUBCUTANEOUS
  Filled 2020-10-05: qty 15

## 2020-10-05 MED ORDER — ACETAMINOPHEN 325 MG PO TABS
650.0000 mg | ORAL_TABLET | Freq: Once | ORAL | Status: AC
  Administered 2020-10-05: 650 mg via ORAL

## 2020-10-05 MED ORDER — DEXAMETHASONE 4 MG PO TABS
ORAL_TABLET | ORAL | Status: AC
  Filled 2020-10-05: qty 5

## 2020-10-05 MED ORDER — ACETAMINOPHEN 325 MG PO TABS
ORAL_TABLET | ORAL | Status: AC
  Filled 2020-10-05: qty 2

## 2020-10-05 MED ORDER — DEXAMETHASONE 4 MG PO TABS
20.0000 mg | ORAL_TABLET | Freq: Once | ORAL | Status: AC
  Administered 2020-10-05: 20 mg via ORAL

## 2020-10-05 NOTE — Patient Instructions (Signed)
[facility] Discharge Instructions for Patients Receiving Chemotherapy  Today you received the following chemotherapy agents: Marlyce Huge  To help prevent nausea and vomiting after your treatment, we encourage you to take your nausea medication as directed.   If you develop nausea and vomiting that is not controlled by your nausea medication, call the clinic.   BELOW ARE SYMPTOMS THAT SHOULD BE REPORTED IMMEDIATELY:  *FEVER GREATER THAN 100.5 F  *CHILLS WITH OR WITHOUT FEVER  NAUSEA AND VOMITING THAT IS NOT CONTROLLED WITH YOUR NAUSEA MEDICATION  *UNUSUAL SHORTNESS OF BREATH  *UNUSUAL BRUISING OR BLEEDING  TENDERNESS IN MOUTH AND THROAT WITH OR WITHOUT PRESENCE OF ULCERS  *URINARY PROBLEMS  *BOWEL PROBLEMS  UNUSUAL RASH Items with * indicate a potential emergency and should be followed up as soon as possible.  Feel free to call the clinic should you have any questions or concerns. The clinic phone number is [phone].  Please show the [facility] at check-in to the Emergency Department and triage nurse.

## 2020-10-05 NOTE — Progress Notes (Signed)
Hematology and Oncology Follow Up Visit  Nicole Keith [id number] Sep 26, 1940 80 y.o. 10/05/2020 11:27 AM Nicole Keith, [name], [name], MD   Principle Diagnosis: 80 year old woman with multiple myeloma arising from MGUS diagnosed in twenty 70.  She has IgG lambda subtype.  Past therapy:    Velcade and dexamethasone started weekly on 07/27/2018.  Therapy changed in February 2020.  Revlimid 25 mg for 21 days out of a 28-day cycle with dexamethasone started in February 2020.  Therapy was held after 2 cycles because of severe dermatological toxicity.  Pomalyst 1 mg daily for 21 days started on May 1 of 2020.  She completed 4 cycles of therapy.  Current therapy: Ninlaro 4 mg weekly with dexamethasone 20 mg weekly for 3 weeks on and 1 week off started in December 2020.  Daratumumab was added on December 02, 2019.  She is currently receiving daratumumab every other week per starting June 2021.    She is currently on maintenance daratumumab monthly starting May 18, 2020.  She is here for the next month of therapy.  Interim History:  Nicole Keith is here for repeat evaluation.  Since the last visit, she reports feeling well without any major complaints.  She has tolerated daratumumab without any issues.  She denies any nausea, fatigue or bone pain she denies any pathological fractures or hospitalizations.  She also had a complete root canal and is healing properly.          Medications: Updated on review. Current Outpatient Medications  Medication Sig Dispense Refill  . acetaminophen (TYLENOL) 500 MG tablet Take 500 mg by mouth in the morning, at noon, and at bedtime.     . diphenhydramine-acetaminophen (TYLENOL PM) 25-500 MG TABS tablet Take 2 tablets by mouth at bedtime as needed (for sleep).     . famotidine-calcium carbonate-magnesium hydroxide (PEPCID COMPLETE) 10-800-165 MG chewable tablet Chew 1 tablet by mouth daily as needed (for heartburn or indigestion).     [name] Kitchen ibuprofen  (ADVIL) 400 MG tablet Take 400 mg by mouth every 6 (six) hours.     . magic mouthwash SOLN Take 5 mLs by mouth 3 (three) times daily as needed for mouth pain. (Patient not taking: Reported on 04/06/2020) 30 mL 0  . Polyethyl Glycol-Propyl Glycol (SYSTANE) 0.4-0.3 % SOLN Place 1 drop into both eyes in the morning and at bedtime.     . triamcinolone (KENALOG) 0.1 % Apply 1 application topically 2 (two) times daily. 30 g 3   No current facility-administered medications for this visit.     Allergies:  Allergies  Allergen Reactions  . Pomalyst [Pomalidomide] Rash and Other (See Comments)    ENTIRE BODY BROKE OUT IN A RASH  . Revlimid [Lenalidomide] Rash and Other (See Comments)    WHOLE BODY BLISTERED  . Betadine [Povidone Iodine] Rash  . Flagyl [Metronidazole] Diarrhea  . Fosamax [Alendronate Sodium] Other (See Comments)    GI distress  . Bactrim [Sulfamethoxazole-Trimethoprim] Nausea Only  . Clindamycin/Lincomycin     Pt can't remember reaction type.  . Doxepin Hcl Other (See Comments)    Irritable, hallucinations  . Hydrocodone-Acetaminophen     Restlessness  . Hydroxyzine Other (See Comments)    Hallucinations  . Hydroxyzine Hcl Other (See Comments)    Hallucinations   . Tramadol Hcl   . Valium [Diazepam] Other (See Comments)    Reaction not recalled  . Actonel [Risedronate Sodium] Other (See Comments)    GI distress  . Boniva [Ibandronic Acid] Other (See  Comments)    GI distress  . Doxycycline Rash  . Penicillins Rash    Ancef was OK      Physical Exam:  Blood pressure 131/77, pulse 74, temperature 97.8 F (36.6 C), temperature source Tympanic, resp. rate 14, height _0  (1.676 m), weight 134 lb 4.8 oz (60.9 kg), SpO2 98 %.     ECOG: 1   General appearance: Comfortable appearing without any discomfort Head: Normocephalic without any trauma Oropharynx: Mucous membranes are moist and pink without any thrush or ulcers. Eyes: Pupils are equal and round reactive  to light. Lymph nodes: No cervical, supraclavicular, inguinal or axillary lymphadenopathy.   Heart:regular rate and rhythm.  S1 and S2 without leg edema. Lung: Clear without any rhonchi or wheezes.  No dullness to percussion. Abdomin: Soft, nontender, nondistended with good bowel sounds.  No hepatosplenomegaly. Musculoskeletal: No joint deformity or effusion.  Full range of motion noted. Neurological: No deficits noted on motor, sensory and deep tendon reflex exam. Skin: No petechial rash or dryness.  Appeared moist.                        Lab Results: Lab Results  Component Value Date   WBC 7.4 09/04/2020   HGB 13.9 09/04/2020   HCT 43.1 09/04/2020   MCV 92.3 09/04/2020   PLT 413 (H) 09/04/2020     Chemistry      Component Value Date/Time   NA 138 09/04/2020 1223   NA 135 (L) 06/11/2017 0753   K 4.3 09/04/2020 1223   K 4.0 06/11/2017 0753   CL 102 09/04/2020 1223   CO2 28 09/04/2020 1223   CO2 26 06/11/2017 0753   BUN 11 09/04/2020 1223   BUN 12.8 06/11/2017 0753   CREATININE 0.80 09/04/2020 1223   CREATININE 0.8 06/11/2017 0753      Component Value Date/Time   CALCIUM 9.4 09/04/2020 1223   CALCIUM 8.8 06/11/2017 0753   ALKPHOS 63 09/04/2020 1223   ALKPHOS 72 06/11/2017 0753   AST 16 09/04/2020 1223   AST 21 06/11/2017 0753   ALT 13 09/04/2020 1223   ALT 13 06/11/2017 0753   BILITOT 0.4 09/04/2020 1223   BILITOT 0.35 06/11/2017 0753     Results for Nicole Keith (MRN [id number]) as of 10/05/2020 11:29  Ref. Range 08/09/2020 11:19 09/04/2020 12:23 09/04/2020 12:24  M Protein SerPl Elph-Mcnc Latest Ref Range: Not Observed g/dL 0.1 (H)  0.1 (H)    Results for Nicole Keith (MRN [id number]) as of 10/05/2020 11:29  Ref. Range 08/09/2020 11:19 09/04/2020 12:23  Kappa free light chain Latest Ref Range: 3.3 - 19.4 mg/L 4.4 4.4  Lamda free light chains Latest Ref Range: 5.7 - 26.3 mg/L 3.0 (L) 3.4 (L)  Kappa, lamda light chain ratio Latest Ref  Range: 0.26 - 1.65  1.47 1.29     Impression and Plan:  80 year old woman with:  1.  Multiple myeloma diagnosed in twenty nineteen after presenting with IgG lambda arising from MGUS.   Her disease status was updated at this time and treatment options were reviewed.  Protein studies on January 25 were discussed today and showed a near complete response with very little residual M protein and normalization of her light chains.  Risks and benefits of continuing daratumumab maintenance were discussed at this time.  She is agreeable to continue at this time.   2. Osteoporosis and compression fracture: She has received Zometa in the past and currently on  hold because of dental issues.  She has completed to root canals and currently healing.  We will continue to hold off on Zometa for the time being.   3.  Covid vaccine considerations: She is up-to-date on her vaccination series and not due for her booster yet.  4.  Rash: Improved with triamcinolone cream.  5. Follow-up: She will follow up in 1 month for the next cycle of therapy.  30  minutes were dedicated to this encounter.  The time was spent on reviewing disease status, discussing treatment options and future plan of care review.  Zola Button, MD 2/25/202211:27 AM

## 2020-10-08 LAB — MULTIPLE MYELOMA PANEL, SERUM
Albumin SerPl Elph-Mcnc: 3.5 g/dL (ref 2.9–4.4)
Albumin/Glob SerPl: 1.7 (ref 0.7–1.7)
Alpha 1: 0.2 g/dL (ref 0.0–0.4)
Alpha2 Glob SerPl Elph-Mcnc: 0.7 g/dL (ref 0.4–1.0)
B-Globulin SerPl Elph-Mcnc: 0.8 g/dL (ref 0.7–1.3)
Gamma Glob SerPl Elph-Mcnc: 0.4 g/dL (ref 0.4–1.8)
Globulin, Total: 2.1 g/dL — ABNORMAL LOW (ref 2.2–3.9)
IgA: 25 mg/dL — ABNORMAL LOW (ref 64–422)
IgG (Immunoglobin G), Serum: 408 mg/dL — ABNORMAL LOW (ref 586–1602)
IgM (Immunoglobulin M), Srm: 24 mg/dL — ABNORMAL LOW (ref 26–217)
M Protein SerPl Elph-Mcnc: 0.1 g/dL — ABNORMAL HIGH
Total Protein ELP: 5.6 g/dL — ABNORMAL LOW (ref 6.0–8.5)

## 2020-10-08 LAB — KAPPA/LAMBDA LIGHT CHAINS
Kappa free light chain: 4.3 mg/L (ref 3.3–19.4)
Kappa, lambda light chain ratio: 1.26 (ref 0.26–1.65)
Lambda free light chains: 3.4 mg/L — ABNORMAL LOW (ref 5.7–26.3)

## 2020-10-09 ENCOUNTER — Telehealth: Payer: Self-pay | Admitting: Oncology

## 2020-10-09 NOTE — Telephone Encounter (Signed)
Scheduled per 02/25 los, patient has been called and voicemail was left.

## 2020-11-02 ENCOUNTER — Inpatient Hospital Stay: Payer: Medicare Other | Attending: Oncology

## 2020-11-02 ENCOUNTER — Other Ambulatory Visit: Payer: Self-pay

## 2020-11-02 ENCOUNTER — Inpatient Hospital Stay: Payer: Medicare Other | Admitting: Oncology

## 2020-11-02 ENCOUNTER — Inpatient Hospital Stay: Payer: Medicare Other

## 2020-11-02 VITALS — BP 126/77 | HR 81 | Temp 97.0°F | Resp 18 | Ht 66.0 in | Wt 133.7 lb

## 2020-11-02 DIAGNOSIS — Z5112 Encounter for antineoplastic immunotherapy: Secondary | ICD-10-CM | POA: Diagnosis not present

## 2020-11-02 DIAGNOSIS — C9000 Multiple myeloma not having achieved remission: Secondary | ICD-10-CM

## 2020-11-02 DIAGNOSIS — Z79899 Other long term (current) drug therapy: Secondary | ICD-10-CM | POA: Insufficient documentation

## 2020-11-02 LAB — CBC WITH DIFFERENTIAL (CANCER CENTER ONLY)
Abs Immature Granulocytes: 0.01 10*3/uL (ref 0.00–0.07)
Basophils Absolute: 0.1 10*3/uL (ref 0.0–0.1)
Basophils Relative: 1 %
Eosinophils Absolute: 0.1 10*3/uL (ref 0.0–0.5)
Eosinophils Relative: 2 %
HCT: 43 % (ref 36.0–46.0)
Hemoglobin: 14.2 g/dL (ref 12.0–15.0)
Immature Granulocytes: 0 %
Lymphocytes Relative: 23 %
Lymphs Abs: 1.9 10*3/uL (ref 0.7–4.0)
MCH: 30.7 pg (ref 26.0–34.0)
MCHC: 33 g/dL (ref 30.0–36.0)
MCV: 93.1 fL (ref 80.0–100.0)
Monocytes Absolute: 0.5 10*3/uL (ref 0.1–1.0)
Monocytes Relative: 6 %
Neutro Abs: 5.4 10*3/uL (ref 1.7–7.7)
Neutrophils Relative %: 68 %
Platelet Count: 361 10*3/uL (ref 150–400)
RBC: 4.62 MIL/uL (ref 3.87–5.11)
RDW: 13.7 % (ref 11.5–15.5)
WBC Count: 8 10*3/uL (ref 4.0–10.5)
nRBC: 0 % (ref 0.0–0.2)

## 2020-11-02 LAB — CMP (CANCER CENTER ONLY)
ALT: 16 U/L (ref 0–44)
AST: 20 U/L (ref 15–41)
Albumin: 4 g/dL (ref 3.5–5.0)
Alkaline Phosphatase: 66 U/L (ref 38–126)
Anion gap: 14 (ref 5–15)
BUN: 8 mg/dL (ref 8–23)
CO2: 24 mmol/L (ref 22–32)
Calcium: 9.2 mg/dL (ref 8.9–10.3)
Chloride: 104 mmol/L (ref 98–111)
Creatinine: 0.82 mg/dL (ref 0.44–1.00)
GFR, Estimated: >60 mL/min (ref >60)
Glucose, Bld: 124 mg/dL — ABNORMAL HIGH (ref 70–99)
Potassium: 4.1 mmol/L (ref 3.5–5.1)
Sodium: 142 mmol/L (ref 135–145)
Total Bilirubin: 0.5 mg/dL (ref 0.3–1.2)
Total Protein: 6.5 g/dL (ref 6.5–8.1)

## 2020-11-02 MED ORDER — ACETAMINOPHEN 325 MG PO TABS
650.0000 mg | ORAL_TABLET | Freq: Once | ORAL | Status: AC
  Administered 2020-11-02: 650 mg via ORAL

## 2020-11-02 MED ORDER — DEXAMETHASONE 4 MG PO TABS
20.0000 mg | ORAL_TABLET | Freq: Once | ORAL | Status: AC
  Administered 2020-11-02: 20 mg via ORAL

## 2020-11-02 MED ORDER — DIPHENHYDRAMINE HCL 25 MG PO CAPS
ORAL_CAPSULE | ORAL | Status: AC
  Filled 2020-11-02: qty 2

## 2020-11-02 MED ORDER — ACETAMINOPHEN 325 MG PO TABS
ORAL_TABLET | ORAL | Status: AC
  Filled 2020-11-02: qty 2

## 2020-11-02 MED ORDER — DEXAMETHASONE 4 MG PO TABS
ORAL_TABLET | ORAL | Status: AC
  Filled 2020-11-02: qty 5

## 2020-11-02 MED ORDER — DARATUMUMAB-HYALURONIDASE-FIHJ 1800-30000 MG-UT/15ML ~~LOC~~ SOLN
1800.0000 mg | Freq: Once | SUBCUTANEOUS | Status: AC
  Administered 2020-11-02: 1800 mg via SUBCUTANEOUS
  Filled 2020-11-02: qty 15

## 2020-11-02 MED ORDER — DIPHENHYDRAMINE HCL 25 MG PO CAPS
50.0000 mg | ORAL_CAPSULE | Freq: Once | ORAL | Status: AC
  Administered 2020-11-02: 50 mg via ORAL

## 2020-11-02 MED FILL — TRIAMCINOLONE 0.1% CREAM: 0.1 | 15 days supply | Qty: 30 | Fill #1

## 2020-11-02 NOTE — Patient Instructions (Signed)
[facility] Discharge Instructions for Patients Receiving Chemotherapy  Today you received the following chemotherapy agents: Darzalex Faspro  To help prevent nausea and vomiting after your treatment, we encourage you to take your nausea medication  as prescribed.    If you develop nausea and vomiting that is not controlled by your nausea medication, call the clinic.   BELOW ARE SYMPTOMS THAT SHOULD BE REPORTED IMMEDIATELY:  *FEVER GREATER THAN 100.5 F  *CHILLS WITH OR WITHOUT FEVER  NAUSEA AND VOMITING THAT IS NOT CONTROLLED WITH YOUR NAUSEA MEDICATION  *UNUSUAL SHORTNESS OF BREATH  *UNUSUAL BRUISING OR BLEEDING  TENDERNESS IN MOUTH AND THROAT WITH OR WITHOUT PRESENCE OF ULCERS  *URINARY PROBLEMS  *BOWEL PROBLEMS  UNUSUAL RASH Items with * indicate a potential emergency and should be followed up as soon as possible.  Feel free to call the clinic should you have any questions or concerns. The clinic phone number is [phone].  Please show the CHEMO ALERT CARD at check-in to the Emergency Department and triage nurse.   

## 2020-11-02 NOTE — Progress Notes (Signed)
Hematology and Oncology Follow Up Visit  [name] [id number] 05/20/1941 80 y.o. 11/02/2020 9:24 AM Nicole Keith, [name], [name], MD   Principle Diagnosis: 80 year old woman with IgG lambda multiple myeloma diagnosed in 2019 after she presented with MGUS.   She was found to have of 29% plasma cell infiltration and symptomatic anemia.  Past therapy:    Velcade and dexamethasone started weekly on 07/27/2018.  Therapy changed in February 2020.  Revlimid 25 mg for 21 days out of a 28-day cycle with dexamethasone started in February 2020.  Therapy was held after 2 cycles because of severe dermatological toxicity.  Pomalyst 1 mg daily for 21 days started on May 1 of 2020.  She completed 4 cycles of therapy.   Ninlaro 4 mg weekly with dexamethasone 20 mg weekly for 3 weeks on and 1 week off started in December 2020.  Daratumumab was added on December 02, 2019.  Ninlaro discontinued in November 2021.  Current therapy:  Daratumumab monthly starting May 18, 2020.  She is here for the next cycle of therapy.  Interim History:  Nicole Keith returns today for a follow-up visit.  Since the last visit, she reports no major changes in her health.  She denies any nausea, vomiting or abdominal pain.  She denies any excessive fatigue or tiredness.  She continues to have 5 dermatological issues predominantly rash that is manageable.  She denies any bone pain or pathological fractures.  She denies any recent falls or syncope.          Medications: Unchanged on review. Current Outpatient Medications  Medication Sig Dispense Refill  . acetaminophen (TYLENOL) 500 MG tablet Take 500 mg by mouth in the morning, at noon, and at bedtime.     . diphenhydramine-acetaminophen (TYLENOL PM) 25-500 MG TABS tablet Take 2 tablets by mouth at bedtime as needed (for sleep).     . famotidine-calcium carbonate-magnesium hydroxide (PEPCID COMPLETE) 10-800-165 MG chewable tablet Chew 1 tablet by mouth daily as needed  (for heartburn or indigestion).     [name] Kitchen ibuprofen (ADVIL) 400 MG tablet Take 400 mg by mouth every 6 (six) hours.     . magic mouthwash SOLN Take 5 mLs by mouth 3 (three) times daily as needed for mouth pain. (Patient not taking: Reported on 04/06/2020) 30 mL 0  . Polyethyl Glycol-Propyl Glycol (SYSTANE) 0.4-0.3 % SOLN Place 1 drop into both eyes in the morning and at bedtime.     . triamcinolone (KENALOG) 0.1 % Apply 1 application topically 2 (two) times daily. 30 g 3   No current facility-administered medications for this visit.     Allergies:  Allergies  Allergen Reactions  . Pomalyst [Pomalidomide] Rash and Other (See Comments)    ENTIRE BODY BROKE OUT IN A RASH  . Revlimid [Lenalidomide] Rash and Other (See Comments)    WHOLE BODY BLISTERED  . Betadine [Povidone Iodine] Rash  . Flagyl [Metronidazole] Diarrhea  . Fosamax [Alendronate Sodium] Other (See Comments)    GI distress  . Bactrim [Sulfamethoxazole-Trimethoprim] Nausea Only  . Clindamycin/Lincomycin     Pt can't remember reaction type.  . Doxepin Hcl Other (See Comments)    Irritable, hallucinations  . Hydrocodone-Acetaminophen     Restlessness  . Hydroxyzine Other (See Comments)    Hallucinations  . Hydroxyzine Hcl Other (See Comments)    Hallucinations   . Tramadol Hcl   . Valium [Diazepam] Other (See Comments)    Reaction not recalled  . Actonel [Risedronate Sodium] Other (See Comments)  GI distress  . Boniva [Ibandronic Acid] Other (See Comments)    GI distress  . Doxycycline Rash  . Penicillins Rash    Ancef was OK      Physical Exam:   Blood pressure 126/77, pulse 81, temperature (!) 97 F (36.1 C), temperature source Tympanic, resp. rate 18, height '5\' 6"'  (1.676 m), weight 133 lb 11.2 oz (60.6 kg), SpO2 98 %.     ECOG: 1    General appearance: Alert, awake without any distress. Head: Atraumatic without abnormalities Oropharynx: Without any thrush or ulcers. Eyes: No scleral  icterus. Lymph nodes: No lymphadenopathy noted in the cervical, supraclavicular, or axillary nodes Heart:regular rate and rhythm, without any murmurs or gallops.   Lung: Clear to auscultation without any rhonchi, wheezes or dullness to percussion. Abdomin: Soft, nontender without any shifting dullness or ascites. Musculoskeletal: No clubbing or cyanosis. Neurological: No motor or sensory deficits. Skin: No rashes or lesions.                         Lab Results: Lab Results  Component Value Date   WBC 7.8 10/05/2020   HGB 13.4 10/05/2020   HCT 41.4 10/05/2020   MCV 94.1 10/05/2020   PLT 382 10/05/2020     Chemistry      Component Value Date/Time   NA 139 10/05/2020 1143   NA 135 (L) 06/11/2017 0753   K 4.2 10/05/2020 1143   K 4.0 06/11/2017 0753   CL 104 10/05/2020 1143   CO2 27 10/05/2020 1143   CO2 26 06/11/2017 0753   BUN 9 10/05/2020 1143   BUN 12.8 06/11/2017 0753   CREATININE 0.75 10/05/2020 1143   CREATININE 0.8 06/11/2017 0753      Component Value Date/Time   CALCIUM 9.0 10/05/2020 1143   CALCIUM 8.8 06/11/2017 0753   ALKPHOS 59 10/05/2020 1143   ALKPHOS 72 06/11/2017 0753   AST 16 10/05/2020 1143   AST 21 06/11/2017 0753   ALT 14 10/05/2020 1143   ALT 13 06/11/2017 0753   BILITOT 0.3 10/05/2020 1143   BILITOT 0.35 06/11/2017 0753      Results for Nicole Keith (MRN [id number]) as of 11/02/2020 09:25  Ref. Range 08/09/2020 11:19 09/04/2020 12:24 10/05/2020 11:44  M Protein SerPl Elph-Mcnc Latest Ref Range: Not Observed g/dL 0.1 (H) 0.1 (H) 0.1 (H)  IFE 1 Unknown Comment (A) Comment (A) Comment (A)  Globulin, Total Latest Ref Range: 2.2 - 3.9 g/dL 2.2 2.4 2.1 (L)  B-Globulin SerPl Elph-Mcnc Latest Ref Range: 0.7 - 1.3 g/dL 0.8 0.9 0.8  IgG (Immunoglobin G), Serum Latest Ref Range: 586 - 1,602 mg/dL 428 (L) 424 (L) 408 (L)  IgM (Immunoglobulin M), Srm Latest Ref Range: 26 - 217 mg/dL 22 (L) 23 (L) 24 (L)  IgA Latest Ref Range: 64 -  422 mg/dL 24 (L) 25 (L) 25 (L)   Results for Nicole Keith (MRN [id number]) as of 11/02/2020 09:25  Ref. Range 10/05/2020 11:43  Kappa free light chain Latest Ref Range: 3.3 - 19.4 mg/L 4.3  Lamda free light chains Latest Ref Range: 5.7 - 26.3 mg/L 3.4 (L)  Kappa, lamda light chain ratio Latest Ref Range: 0.26 - 1.65  1.26    Impression and Plan:  80 year old woman with:  1.  IgG lambda multiple myeloma diagnosed arising from MGUS diagnosed in 2019.  She is currently on maintenance daratumumab with continued excellent response.  Protein studies from October 05, 2020 were  personally reviewed and discussed with the patient today continues to show excellent response with very little residual disease including M spike of 0.1 g/dL and normalization of her kappa to lambda ratio.  Risks and benefits of continuing this treatment were discussed at this time.  Alternative treatment options including salvage therapy with Kyprolis were reiterated.  At this time I recommended continuing the same dose and schedule.  I will change her schedule to every 3 months infusion based on her wishes and overall stable disease status.  2. Osteoporosis and compression fracture: She is currently on Zometa but therapy has been on hold because of dental complications.   3.  Covid vaccine considerations: She has not received her booster vaccination which anticipate will be in the next month or 2.  4.  Rash: Unclear etiology and has been managed with triamcinolone cream.  5. Follow-up: In 4 weeks for repeat follow-up.  After that, will consider infusion of daratumumab every 3 months.  30  minutes were spent on this visit.  The time was spent on reviewing laboratory data, disease status update discussing treatment options and future plan of care review.  Zola Button, MD 3/25/20229:24 AM

## 2020-11-05 LAB — MULTIPLE MYELOMA PANEL, SERUM
Albumin SerPl Elph-Mcnc: 3.6 g/dL (ref 2.9–4.4)
Albumin/Glob SerPl: 1.5 (ref 0.7–1.7)
Alpha 1: 0.3 g/dL (ref 0.0–0.4)
Alpha2 Glob SerPl Elph-Mcnc: 0.8 g/dL (ref 0.4–1.0)
B-Globulin SerPl Elph-Mcnc: 0.9 g/dL (ref 0.7–1.3)
Gamma Glob SerPl Elph-Mcnc: 0.5 g/dL (ref 0.4–1.8)
Globulin, Total: 2.5 g/dL (ref 2.2–3.9)
IgA: 27 mg/dL — ABNORMAL LOW (ref 64–422)
IgG (Immunoglobin G), Serum: 435 mg/dL — ABNORMAL LOW (ref 586–1602)
IgM (Immunoglobulin M), Srm: 28 mg/dL (ref 26–217)
M Protein SerPl Elph-Mcnc: 0.2 g/dL — ABNORMAL HIGH
Total Protein ELP: 6.1 g/dL (ref 6.0–8.5)

## 2020-11-05 LAB — KAPPA/LAMBDA LIGHT CHAINS
Kappa free light chain: 5 mg/L (ref 3.3–19.4)
Kappa, lambda light chain ratio: 1.52 (ref 0.26–1.65)
Lambda free light chains: 3.3 mg/L — ABNORMAL LOW (ref 5.7–26.3)

## 2020-11-19 ENCOUNTER — Encounter: Payer: Self-pay | Admitting: Oncology

## 2020-11-19 NOTE — Progress Notes (Signed)
Pt called w/ questions and concerns about her grant w/ the [employer].  After researching, pt had a grant w/ [employer] to help with her oral medication that the pharmacy obtained for her that expired on 08/20/20.  Pt received a letter from the foundation making her aware that her grant was about to expire but never called them to reapply.  She started receiving bills for the Darzalex Faspro and wanted to know why wasn't she renewed for the grant.  I informed her since the pharmacy enrolled her, Marguarite Arbour or myself had no way knowing about that grant or when it was about to expire but in Shauna's absence, I was able to re enroll her w/ the [employer] from 10/20/20 to 10/19/21 for $11,000 to cover the Darzalex Faspro.  She was very Patent attorney.

## 2020-11-21 ENCOUNTER — Encounter: Payer: Self-pay | Admitting: Oncology

## 2020-11-21 NOTE — Progress Notes (Signed)
Patient called regarding copay assistance which expired and Lenise had already spoken with her on 11/19/20. Reminded her of the conversation with Lenise and ensured her the information given at that time was correct. She understands that the amount provided is her responsibility.  She has my card for any additional financial questions or concerns.

## 2020-11-30 ENCOUNTER — Inpatient Hospital Stay: Payer: Medicare Other | Admitting: Oncology

## 2020-11-30 ENCOUNTER — Ambulatory Visit: Payer: Medicare Other

## 2020-11-30 ENCOUNTER — Ambulatory Visit: Payer: Medicare Other | Admitting: Oncology

## 2020-11-30 ENCOUNTER — Other Ambulatory Visit: Payer: Medicare Other

## 2020-11-30 ENCOUNTER — Encounter: Payer: Self-pay | Admitting: Oncology

## 2020-11-30 ENCOUNTER — Other Ambulatory Visit: Payer: Self-pay

## 2020-11-30 ENCOUNTER — Inpatient Hospital Stay: Payer: Medicare Other

## 2020-11-30 ENCOUNTER — Inpatient Hospital Stay: Payer: Medicare Other | Attending: Oncology

## 2020-11-30 VITALS — BP 133/90 | HR 77 | Temp 98.0°F | Resp 18 | Ht 66.0 in | Wt 133.1 lb

## 2020-11-30 DIAGNOSIS — Z79899 Other long term (current) drug therapy: Secondary | ICD-10-CM | POA: Insufficient documentation

## 2020-11-30 DIAGNOSIS — C9000 Multiple myeloma not having achieved remission: Secondary | ICD-10-CM | POA: Diagnosis not present

## 2020-11-30 DIAGNOSIS — Z5112 Encounter for antineoplastic immunotherapy: Secondary | ICD-10-CM | POA: Diagnosis not present

## 2020-11-30 LAB — CBC WITH DIFFERENTIAL (CANCER CENTER ONLY)
Abs Immature Granulocytes: 0.02 10*3/uL (ref 0.00–0.07)
Basophils Absolute: 0 10*3/uL (ref 0.0–0.1)
Basophils Relative: 1 %
Eosinophils Absolute: 0.1 10*3/uL (ref 0.0–0.5)
Eosinophils Relative: 1 %
HCT: 40.9 % (ref 36.0–46.0)
Hemoglobin: 13.4 g/dL (ref 12.0–15.0)
Immature Granulocytes: 0 %
Lymphocytes Relative: 27 %
Lymphs Abs: 1.9 10*3/uL (ref 0.7–4.0)
MCH: 30.5 pg (ref 26.0–34.0)
MCHC: 32.8 g/dL (ref 30.0–36.0)
MCV: 93 fL (ref 80.0–100.0)
Monocytes Absolute: 0.7 10*3/uL (ref 0.1–1.0)
Monocytes Relative: 10 %
Neutro Abs: 4.3 10*3/uL (ref 1.7–7.7)
Neutrophils Relative %: 61 %
Platelet Count: 362 10*3/uL (ref 150–400)
RBC: 4.4 MIL/uL (ref 3.87–5.11)
RDW: 13.5 % (ref 11.5–15.5)
WBC Count: 7 10*3/uL (ref 4.0–10.5)
nRBC: 0 % (ref 0.0–0.2)

## 2020-11-30 LAB — CMP (CANCER CENTER ONLY)
ALT: 12 U/L (ref 0–44)
AST: 18 U/L (ref 15–41)
Albumin: 3.8 g/dL (ref 3.5–5.0)
Alkaline Phosphatase: 57 U/L (ref 38–126)
Anion gap: 9 (ref 5–15)
BUN: 10 mg/dL (ref 8–23)
CO2: 26 mmol/L (ref 22–32)
Calcium: 9.2 mg/dL (ref 8.9–10.3)
Chloride: 104 mmol/L (ref 98–111)
Creatinine: 0.75 mg/dL (ref 0.44–1.00)
GFR, Estimated: >60 mL/min (ref >60)
Glucose, Bld: 102 mg/dL — ABNORMAL HIGH (ref 70–99)
Potassium: 4.4 mmol/L (ref 3.5–5.1)
Sodium: 139 mmol/L (ref 135–145)
Total Bilirubin: 0.5 mg/dL (ref 0.3–1.2)
Total Protein: 6.2 g/dL — ABNORMAL LOW (ref 6.5–8.1)

## 2020-11-30 MED ORDER — ACETAMINOPHEN 325 MG PO TABS
650.0000 mg | ORAL_TABLET | Freq: Once | ORAL | Status: AC
  Administered 2020-11-30: 650 mg via ORAL

## 2020-11-30 MED ORDER — DEXAMETHASONE 4 MG PO TABS
20.0000 mg | ORAL_TABLET | Freq: Once | ORAL | Status: AC
  Administered 2020-11-30: 20 mg via ORAL

## 2020-11-30 MED ORDER — DARATUMUMAB-HYALURONIDASE-FIHJ 1800-30000 MG-UT/15ML ~~LOC~~ SOLN
1800.0000 mg | Freq: Once | SUBCUTANEOUS | Status: AC
  Administered 2020-11-30: 1800 mg via SUBCUTANEOUS
  Filled 2020-11-30: qty 15

## 2020-11-30 MED ORDER — DIPHENHYDRAMINE HCL 25 MG PO CAPS
50.0000 mg | ORAL_CAPSULE | Freq: Once | ORAL | Status: AC
  Administered 2020-11-30: 50 mg via ORAL

## 2020-11-30 NOTE — Progress Notes (Signed)
Went to registration area whom had questions regarding previously discussed copay assistance/expiration/re-enrollment.  Listened to her concerns and addressed them accordingly.  She has my card for any additional financial questions or concerns.

## 2020-11-30 NOTE — Progress Notes (Signed)
Hematology and Oncology Follow Up Visit  Nicole Keith [id number] 22-Jun-1941 80 y.o. 11/30/2020 10:42 AM Nicole Keith, [name], [name], MD   Principle Diagnosis: 80 year old woman with multiple myeloma diagnosed in 2019 arising from MGUS.  She developed IgG lambda with 29% plasma cell infiltration and symptomatic anemia.  Past therapy:    Velcade and dexamethasone started weekly on 07/27/2018.  Therapy changed in February 2020.  Revlimid 25 mg for 21 days out of a 28-day cycle with dexamethasone started in February 2020.  Therapy was held after 2 cycles because of severe dermatological toxicity.  Pomalyst 1 mg daily for 21 days started on May 1 of 2020.  She completed 4 cycles of therapy.   Ninlaro 4 mg weekly with dexamethasone 20 mg weekly for 3 weeks on and 1 week off started in December 2020.  Daratumumab was added on December 02, 2019.  Ninlaro discontinued in November 2021.  Current therapy:  Daratumumab monthly starting May 18, 2020.  She is here for the next cycle of therapy.  Interim History:  Nicole Keith returns today for repeat evaluation.  Since the last visit, she reports no major changes in her health.  She does report some fatigue and tiredness associated with daratumumab infusion.  She denies any nausea, vomiting or abdominal pain.  He does report occasional skin rash and uses triamcinolone cream for it.  Performance status quality of life not dramatically different.          Medications: Updated on review. Current Outpatient Medications  Medication Sig Dispense Refill  . acetaminophen (TYLENOL) 500 MG tablet Take 500 mg by mouth in the morning, at noon, and at bedtime.     . diphenhydramine-acetaminophen (TYLENOL PM) 25-500 MG TABS tablet Take 2 tablets by mouth at bedtime as needed (for sleep).     . famotidine-calcium carbonate-magnesium hydroxide (PEPCID COMPLETE) 10-800-165 MG chewable tablet Chew 1 tablet by mouth daily as needed (for heartburn or  indigestion).     [name] Kitchen ibuprofen (ADVIL) 400 MG tablet Take 400 mg by mouth every 6 (six) hours.    [name] Kitchen levofloxacin (LEVAQUIN) 500 MG tablet TAKE 1 TABLET BY MOUTH ONCE A DAY UNTIL GONE 7 tablet 0  . magic mouthwash SOLN Take 5 mLs by mouth 3 (three) times daily as needed for mouth pain. (Patient not taking: No sig reported) 30 mL 0  . Polyethyl Glycol-Propyl Glycol (SYSTANE) 0.4-0.3 % SOLN Place 1 drop into both eyes in the morning and at bedtime.     . triamcinolone (KENALOG) 0.1 % APPLY TOPICALLY ONTO THE SKIN 2 TIMES DAILY 30 g 3   No current facility-administered medications for this visit.     Allergies:  Allergies  Allergen Reactions  . Pomalyst [Pomalidomide] Rash and Other (See Comments)    ENTIRE BODY BROKE OUT IN A RASH  . Revlimid [Lenalidomide] Rash and Other (See Comments)    WHOLE BODY BLISTERED  . Betadine [Povidone Iodine] Rash  . Flagyl [Metronidazole] Diarrhea  . Fosamax [Alendronate Sodium] Other (See Comments)    GI distress  . Bactrim [Sulfamethoxazole-Trimethoprim] Nausea Only  . Clindamycin/Lincomycin     Pt can't remember reaction type.  . Doxepin Hcl Other (See Comments)    Irritable, hallucinations  . Hydrocodone-Acetaminophen     Restlessness  . Hydroxyzine Other (See Comments)    Hallucinations  . Hydroxyzine Hcl Other (See Comments)    Hallucinations   . Tramadol Hcl   . Valium [Diazepam] Other (See Comments)    Reaction not recalled  .  Actonel [Risedronate Sodium] Other (See Comments)    GI distress  . Boniva [Ibandronic Acid] Other (See Comments)    GI distress  . Doxycycline Rash  . Penicillins Rash    Ancef was OK      Physical Exam:    Blood pressure 133/90, pulse 77, temperature 98 F (36.7 C), temperature source Tympanic, resp. rate 18, height 5' 6" (1.676 m), weight 133 lb 1.6 oz (60.4 kg), SpO2 97 %.     ECOG: 1   General appearance: Comfortable appearing without any discomfort Head: Normocephalic without any  trauma Oropharynx: Mucous membranes are moist and pink without any thrush or ulcers. Eyes: Pupils are equal and round reactive to light. Lymph nodes: No cervical, supraclavicular, inguinal or axillary lymphadenopathy.   Heart:regular rate and rhythm.  S1 and S2 without leg edema. Lung: Clear without any rhonchi or wheezes.  No dullness to percussion. Abdomin: Soft, nontender, nondistended with good bowel sounds.  No hepatosplenomegaly. Musculoskeletal: No joint deformity or effusion.  Full range of motion noted. Neurological: No deficits noted on motor, sensory and deep tendon reflex exam. Skin: No petechial rash or dryness.  Appeared moist.                           Lab Results: Lab Results  Component Value Date   WBC 8.0 11/02/2020   HGB 14.2 11/02/2020   HCT 43.0 11/02/2020   MCV 93.1 11/02/2020   PLT 361 11/02/2020     Chemistry      Component Value Date/Time   NA 142 11/02/2020 0925   NA 135 (L) 06/11/2017 0753   K 4.1 11/02/2020 0925   K 4.0 06/11/2017 0753   CL 104 11/02/2020 0925   CO2 24 11/02/2020 0925   CO2 26 06/11/2017 0753   BUN 8 11/02/2020 0925   BUN 12.8 06/11/2017 0753   CREATININE 0.82 11/02/2020 0925   CREATININE 0.8 06/11/2017 0753      Component Value Date/Time   CALCIUM 9.2 11/02/2020 0925   CALCIUM 8.8 06/11/2017 0753   ALKPHOS 66 11/02/2020 0925   ALKPHOS 72 06/11/2017 0753   AST 20 11/02/2020 0925   AST 21 06/11/2017 0753   ALT 16 11/02/2020 0925   ALT 13 06/11/2017 0753   BILITOT 0.5 11/02/2020 0925   BILITOT 0.35 06/11/2017 0753      Results for Nicole Keith (MRN [id number]) as of 11/30/2020 10:56  Ref. Range 09/04/2020 12:24 10/05/2020 11:43 10/05/2020 11:44 11/02/2020 09:25  M Protein SerPl Elph-Mcnc Latest Ref Range: Not Observed g/dL 0.1 (H)  0.1 (H) 0.2 (H)  IFE 1 Unknown Comment (A)  Comment (A) Comment (A)  Globulin, Total Latest Ref Range: 2.2 - 3.9 g/dL 2.4  2.1 (L) 2.5  B-Globulin SerPl Elph-Mcnc Latest  Ref Range: 0.7 - 1.3 g/dL 0.9  0.8 0.9  IgG (Immunoglobin G), Serum Latest Ref Range: 586 - 1,602 mg/dL 424 (L)  408 (L) 435 (L)  IgM (Immunoglobulin M), Srm Latest Ref Range: 26 - 217 mg/dL 23 (L)  24 (L) 28  IgA Latest Ref Range: 64 - 422 mg/dL 25 (L)  25 (L) 27 (L)     Impression and Plan:  80 year old woman with:  1.  Multiple myeloma arising from MGUS diagnosed in 2019.  She was found to have IgG lambda subtype.  Disease status was updated at this time and continues to be asymptomatic.  Protein studies on November 02, 2020 showed slight increase  in her M protein but overall continues to be under excellent response.  Risks and benefits of continuing daratumumab infusion compared to switching to maintenance were discussed.  Laboratory data from today showed normal CBC with hemoglobin of 13.4.  She is agreeable to proceed with the infusion today and will be repeated every 3 months for maintenance purposes.  2. Osteoporosis and compression fracture: She has received Zometa in the past and has opted against any additional infusion.   3.  Covid vaccine considerations: I recommended proceeding with booster in the next few months.  4.  Rash: Manageable with triamcinolone cream.  5. Follow-up: In 3 months for repeat evaluation and daratumumab infusion.  30  minutes were dedicated to this encounter.  The time was spent on reviewing laboratory data, disease status update and outlining future plan of care.  Zola Button, MD 4/22/202210:42 AM

## 2020-11-30 NOTE — Patient Instructions (Signed)
[facility]  Discharge Instructions: Thank you for choosing [facility] to provide your oncology and hematology care.   If you have a lab appointment with the Cancer Center, please go directly to the Cancer Center and check in at the registration area.   Wear comfortable clothing and clothing appropriate for easy access to any Portacath or PICC line.   We strive to give you quality time with your provider. You may need to reschedule your appointment if you arrive late (15 or more minutes).  Arriving late affects you and other patients whose appointments are after yours.  Also, if you miss three or more appointments without notifying the office, you may be dismissed from the clinic at the provider's discretion.      For prescription refill requests, have your pharmacy contact our office and allow 72 hours for refills to be completed.    Today you received the following chemotherapy and/or immunotherapy agents: Darzalex Faspro      To help prevent nausea and vomiting after your treatment, we encourage you to take your nausea medication as directed.  BELOW ARE SYMPTOMS THAT SHOULD BE REPORTED IMMEDIATELY: *FEVER GREATER THAN 100.4 F (38 C) OR HIGHER *CHILLS OR SWEATING *NAUSEA AND VOMITING THAT IS NOT CONTROLLED WITH YOUR NAUSEA MEDICATION *UNUSUAL SHORTNESS OF BREATH *UNUSUAL BRUISING OR BLEEDING *URINARY PROBLEMS (pain or burning when urinating, or frequent urination) *BOWEL PROBLEMS (unusual diarrhea, constipation, pain near the anus) TENDERNESS IN MOUTH AND THROAT WITH OR WITHOUT PRESENCE OF ULCERS (sore throat, sores in mouth, or a toothache) UNUSUAL RASH, SWELLING OR PAIN  UNUSUAL VAGINAL DISCHARGE OR ITCHING   Items with * indicate a potential emergency and should be followed up as soon as possible or go to the Emergency Department if any problems should occur.  Please show the CHEMOTHERAPY ALERT CARD or IMMUNOTHERAPY ALERT CARD at  check-in to the Emergency Department and triage nurse.  Should you have questions after your visit or need to cancel or reschedule your appointment, please contact [facility]  Dept: [phone]  and follow the prompts.  Office hours are 8:00 a.m. to 4:30 p.m. Monday - Friday. Please note that voicemails left after 4:00 p.m. may not be returned until the following business day.  We are closed weekends and major holidays. You have access to a nurse at all times for urgent questions. Please call the main number to the clinic Dept: [phone] and follow the prompts.   For any non-urgent questions, you may also contact your provider using MyChart. We now offer e-Visits for anyone 18 and older to request care online for non-urgent symptoms. For details visit [email or web address].   Also download the MyChart app! Go to the app store, search "MyChart", open the app, select [facility], and log in with your MyChart username and password.  Due to Covid, a mask is required upon entering the hospital/clinic. If you do not have a mask, one will be given to you upon arrival. For doctor visits, patients may have 1 support person aged 18 or older with them. For treatment visits, patients cannot have anyone with them due to current Covid guidelines and our immunocompromised population.  

## 2020-12-03 LAB — MULTIPLE MYELOMA PANEL, SERUM
Albumin SerPl Elph-Mcnc: 3.6 g/dL (ref 2.9–4.4)
Albumin/Glob SerPl: 1.6 (ref 0.7–1.7)
Alpha 1: 0.3 g/dL (ref 0.0–0.4)
Alpha2 Glob SerPl Elph-Mcnc: 0.7 g/dL (ref 0.4–1.0)
B-Globulin SerPl Elph-Mcnc: 0.9 g/dL (ref 0.7–1.3)
Gamma Glob SerPl Elph-Mcnc: 0.4 g/dL (ref 0.4–1.8)
Globulin, Total: 2.3 g/dL (ref 2.2–3.9)
IgA: 24 mg/dL — ABNORMAL LOW (ref 64–422)
IgG (Immunoglobin G), Serum: 446 mg/dL — ABNORMAL LOW (ref 586–1602)
IgM (Immunoglobulin M), Srm: 25 mg/dL — ABNORMAL LOW (ref 26–217)
M Protein SerPl Elph-Mcnc: 0.1 g/dL — ABNORMAL HIGH
Total Protein ELP: 5.9 g/dL — ABNORMAL LOW (ref 6.0–8.5)

## 2020-12-03 LAB — KAPPA/LAMBDA LIGHT CHAINS
Kappa free light chain: 4.5 mg/L (ref 3.3–19.4)
Kappa, lambda light chain ratio: 1.45 (ref 0.26–1.65)
Lambda free light chains: 3.1 mg/L — ABNORMAL LOW (ref 5.7–26.3)

## 2020-12-05 DIAGNOSIS — Z0000 Encounter for general adult medical examination without abnormal findings: Secondary | ICD-10-CM | POA: Diagnosis not present

## 2020-12-05 DIAGNOSIS — C9000 Multiple myeloma not having achieved remission: Secondary | ICD-10-CM | POA: Diagnosis not present

## 2020-12-05 DIAGNOSIS — E559 Vitamin D deficiency, unspecified: Secondary | ICD-10-CM | POA: Diagnosis not present

## 2020-12-05 DIAGNOSIS — R7303 Prediabetes: Secondary | ICD-10-CM | POA: Diagnosis not present

## 2020-12-05 DIAGNOSIS — E785 Hyperlipidemia, unspecified: Secondary | ICD-10-CM | POA: Diagnosis not present

## 2020-12-05 DIAGNOSIS — M810 Age-related osteoporosis without current pathological fracture: Secondary | ICD-10-CM | POA: Diagnosis not present

## 2020-12-05 DIAGNOSIS — I6529 Occlusion and stenosis of unspecified carotid artery: Secondary | ICD-10-CM | POA: Diagnosis not present

## 2021-02-05 ENCOUNTER — Other Ambulatory Visit (HOSPITAL_COMMUNITY): Payer: Self-pay

## 2021-02-05 ENCOUNTER — Other Ambulatory Visit: Payer: Self-pay | Admitting: Oncology

## 2021-02-05 MED ORDER — TRIAMCINOLONE ACETONIDE 0.1 % EX CREA
TOPICAL_CREAM | CUTANEOUS | 3 refills | Status: DC
  Filled 2021-02-05: qty 30, 15d supply, fill #0

## 2021-02-05 MED ORDER — TRIAMCINOLONE ACETONIDE 0.1 % EX CREA
TOPICAL_CREAM | CUTANEOUS | 3 refills | Status: DC
  Filled 2021-02-05: qty 30, 20d supply, fill #0

## 2021-02-06 ENCOUNTER — Other Ambulatory Visit: Payer: Self-pay | Admitting: Oncology

## 2021-02-06 ENCOUNTER — Other Ambulatory Visit (HOSPITAL_COMMUNITY): Payer: Self-pay

## 2021-02-06 MED ORDER — TRIAMCINOLONE ACETONIDE 0.1 % EX CREA
TOPICAL_CREAM | CUTANEOUS | 3 refills | Status: DC
  Filled 2021-02-06: qty 30, fill #0

## 2021-02-14 ENCOUNTER — Other Ambulatory Visit: Payer: Self-pay | Admitting: Oncology

## 2021-02-14 ENCOUNTER — Other Ambulatory Visit (HOSPITAL_COMMUNITY): Payer: Self-pay

## 2021-02-14 MED ORDER — TRIAMCINOLONE ACETONIDE 0.1 % EX CREA
TOPICAL_CREAM | CUTANEOUS | 3 refills | Status: DC
  Filled 2021-02-14: qty 30, 25d supply, fill #0

## 2021-02-25 ENCOUNTER — Telehealth: Payer: Self-pay | Admitting: Oncology

## 2021-02-25 NOTE — Telephone Encounter (Signed)
Pt called to confirm appts ,

## 2021-02-27 ENCOUNTER — Other Ambulatory Visit (HOSPITAL_COMMUNITY): Payer: Self-pay

## 2021-02-27 ENCOUNTER — Inpatient Hospital Stay: Payer: Medicare Other

## 2021-02-27 ENCOUNTER — Inpatient Hospital Stay (HOSPITAL_BASED_OUTPATIENT_CLINIC_OR_DEPARTMENT_OTHER): Payer: Medicare Other | Admitting: Oncology

## 2021-02-27 ENCOUNTER — Other Ambulatory Visit: Payer: Self-pay

## 2021-02-27 ENCOUNTER — Inpatient Hospital Stay: Payer: Medicare Other | Attending: Oncology

## 2021-02-27 VITALS — BP 130/77 | HR 77 | Temp 98.3°F | Resp 16 | Ht 66.0 in | Wt 132.1 lb

## 2021-02-27 DIAGNOSIS — Z79899 Other long term (current) drug therapy: Secondary | ICD-10-CM | POA: Insufficient documentation

## 2021-02-27 DIAGNOSIS — Z5112 Encounter for antineoplastic immunotherapy: Secondary | ICD-10-CM | POA: Diagnosis not present

## 2021-02-27 DIAGNOSIS — C9000 Multiple myeloma not having achieved remission: Secondary | ICD-10-CM | POA: Insufficient documentation

## 2021-02-27 LAB — CMP (CANCER CENTER ONLY)
ALT: 18 U/L (ref 0–44)
AST: 22 U/L (ref 15–41)
Albumin: 4.1 g/dL (ref 3.5–5.0)
Alkaline Phosphatase: 59 U/L (ref 38–126)
Anion gap: 10 (ref 5–15)
BUN: 12 mg/dL (ref 8–23)
CO2: 28 mmol/L (ref 22–32)
Calcium: 9.5 mg/dL (ref 8.9–10.3)
Chloride: 102 mmol/L (ref 98–111)
Creatinine: 0.67 mg/dL (ref 0.44–1.00)
GFR, Estimated: >60 mL/min (ref >60)
Glucose, Bld: 107 mg/dL — ABNORMAL HIGH (ref 70–99)
Potassium: 4.3 mmol/L (ref 3.5–5.1)
Sodium: 140 mmol/L (ref 135–145)
Total Bilirubin: 0.4 mg/dL (ref 0.3–1.2)
Total Protein: 6.3 g/dL — ABNORMAL LOW (ref 6.5–8.1)

## 2021-02-27 LAB — CBC WITH DIFFERENTIAL (CANCER CENTER ONLY)
Abs Immature Granulocytes: 0.02 10*3/uL (ref 0.00–0.07)
Basophils Absolute: 0.1 10*3/uL (ref 0.0–0.1)
Basophils Relative: 1 %
Eosinophils Absolute: 0.2 10*3/uL (ref 0.0–0.5)
Eosinophils Relative: 2 %
HCT: 41.7 % (ref 36.0–46.0)
Hemoglobin: 14 g/dL (ref 12.0–15.0)
Immature Granulocytes: 0 %
Lymphocytes Relative: 25 %
Lymphs Abs: 1.9 10*3/uL (ref 0.7–4.0)
MCH: 31 pg (ref 26.0–34.0)
MCHC: 33.6 g/dL (ref 30.0–36.0)
MCV: 92.3 fL (ref 80.0–100.0)
Monocytes Absolute: 0.6 10*3/uL (ref 0.1–1.0)
Monocytes Relative: 8 %
Neutro Abs: 4.7 10*3/uL (ref 1.7–7.7)
Neutrophils Relative %: 64 %
Platelet Count: 360 10*3/uL (ref 150–400)
RBC: 4.52 MIL/uL (ref 3.87–5.11)
RDW: 13.5 % (ref 11.5–15.5)
WBC Count: 7.4 10*3/uL (ref 4.0–10.5)
nRBC: 0 % (ref 0.0–0.2)

## 2021-02-27 MED ORDER — ACETAMINOPHEN 325 MG PO TABS
650.0000 mg | ORAL_TABLET | Freq: Once | ORAL | Status: AC
  Administered 2021-02-27: 650 mg via ORAL

## 2021-02-27 MED ORDER — TRIAMCINOLONE ACETONIDE 0.1 % EX CREA
TOPICAL_CREAM | CUTANEOUS | 3 refills | Status: AC
  Filled 2021-02-27: qty 454, 30d supply, fill #0

## 2021-02-27 MED ORDER — DEXAMETHASONE 4 MG PO TABS
ORAL_TABLET | ORAL | Status: AC
  Filled 2021-02-27: qty 5

## 2021-02-27 MED ORDER — DIPHENHYDRAMINE HCL 25 MG PO CAPS
50.0000 mg | ORAL_CAPSULE | Freq: Once | ORAL | Status: AC
  Administered 2021-02-27: 50 mg via ORAL

## 2021-02-27 MED ORDER — DEXAMETHASONE 4 MG PO TABS
20.0000 mg | ORAL_TABLET | Freq: Once | ORAL | Status: AC
  Administered 2021-02-27: 20 mg via ORAL

## 2021-02-27 MED ORDER — ACETAMINOPHEN 325 MG PO TABS
ORAL_TABLET | ORAL | Status: AC
  Filled 2021-02-27: qty 2

## 2021-02-27 MED ORDER — DIPHENHYDRAMINE HCL 25 MG PO CAPS
ORAL_CAPSULE | ORAL | Status: AC
  Filled 2021-02-27: qty 2

## 2021-02-27 MED ORDER — DARATUMUMAB-HYALURONIDASE-FIHJ 1800-30000 MG-UT/15ML ~~LOC~~ SOLN
1800.0000 mg | Freq: Once | SUBCUTANEOUS | Status: AC
  Administered 2021-02-27: 1800 mg via SUBCUTANEOUS
  Filled 2021-02-27: qty 15

## 2021-02-27 NOTE — Progress Notes (Signed)
Hematology and Oncology Follow Up Visit  SHALAY CARDER [id number] 29-Jul-1941 80 y.o. 02/27/2021 9:49 AM [name], [name], [name], MD   Principle Diagnosis: 80 year old woman with IgG lambda multiple myeloma diagnosed in 2019 rising from MGUS.  She presented with 29% plasma cell infiltration and symptomatic anemia.  Past therapy:    Velcade and dexamethasone started weekly on 07/27/2018.  Therapy changed in February 2020.  Revlimid 25 mg for 21 days out of a 28-day cycle with dexamethasone started in February 2020.  Therapy was held after 2 cycles because of severe dermatological toxicity.  Pomalyst 1 mg daily for 21 days started on May 1 of 2020.  She completed 4 cycles of therapy.   Ninlaro 4 mg weekly with dexamethasone 20 mg weekly for 3 weeks on and 1 week off started in December 2020.  Daratumumab was added on December 02, 2019.  Ninlaro discontinued in November 2021.  Current therapy:  Daratumumab monthly starting May 18, 2020.  Therapy changed to every 3 months starting April 2022.  She is here for the next cycle of therapy.  Interim History:  Mrs. Feng is here for a follow-up visit.  Since the last visit, she reports feeling well without any major complaints.  She denies any nausea, vomiting or abdominal pain.  She denies any hospitalizations or illnesses.  She denies any bone pain or pathological fractures.  Performance status and quality of life remained reasonable.         Medications: Unchanged on review. Current Outpatient Medications  Medication Sig Dispense Refill   acetaminophen (TYLENOL) 500 MG tablet Take 500 mg by mouth in the morning, at noon, and at bedtime.      diphenhydramine-acetaminophen (TYLENOL PM) 25-500 MG TABS tablet Take 2 tablets by mouth at bedtime as needed (for sleep).      famotidine-calcium carbonate-magnesium hydroxide (PEPCID COMPLETE) 10-800-165 MG chewable tablet Chew 1 tablet by mouth daily as needed (for heartburn or  indigestion).      ibuprofen (ADVIL) 400 MG tablet Take 400 mg by mouth every 6 (six) hours.     levofloxacin (LEVAQUIN) 500 MG tablet TAKE 1 TABLET BY MOUTH ONCE A DAY UNTIL GONE 7 tablet 0   magic mouthwash SOLN Take 5 mLs by mouth 3 (three) times daily as needed for mouth pain. 30 mL 0   Polyethyl Glycol-Propyl Glycol (SYSTANE) 0.4-0.3 % SOLN Place 1 drop into both eyes in the morning and at bedtime.      triamcinolone cream (KENALOG) 0.1 % APPLY TOPICALLY ONTO THE SKIN 2 TIMES DAILY 30 g 3   triamcinolone cream (KENALOG) 0.1 % APPLY TOPICALLY ONTO THE SKIN 2 TIMES DAILY 30 g 3   No current facility-administered medications for this visit.     Allergies:  Allergies  Allergen Reactions   Pomalyst [Pomalidomide] Rash and Other (See Comments)    ENTIRE BODY BROKE OUT IN A RASH   Revlimid [Lenalidomide] Rash and Other (See Comments)    WHOLE BODY BLISTERED   Betadine [Povidone Iodine] Rash   Flagyl [Metronidazole] Diarrhea   Fosamax [Alendronate Sodium] Other (See Comments)    GI distress   Bactrim [Sulfamethoxazole-Trimethoprim] Nausea Only   Clindamycin/Lincomycin     Pt can't remember reaction type.   Doxepin Hcl Other (See Comments)    Irritable, hallucinations   Hydrocodone-Acetaminophen     Restlessness   Hydroxyzine Other (See Comments)    Hallucinations   Hydroxyzine Hcl Other (See Comments)    Hallucinations    Tramadol Hcl  Valium [Diazepam] Other (See Comments)    Reaction not recalled   Actonel [Risedronate Sodium] Other (See Comments)    GI distress   Boniva [Ibandronic Acid] Other (See Comments)    GI distress   Doxycycline Rash   Penicillins Rash    Ancef was OK      Physical Exam:    Blood pressure 130/77, pulse 77, temperature 98.3 F (36.8 C), temperature source Oral, resp. rate 16, height '5\' 6"'  (1.676 m), weight 132 lb 1.6 oz (59.9 kg), SpO2 98 %.     ECOG: 1    General appearance: Alert, awake without any distress. Head: Atraumatic  without abnormalities Oropharynx: Without any thrush or ulcers. Eyes: No scleral icterus. Lymph nodes: No lymphadenopathy noted in the cervical, supraclavicular, or axillary nodes Heart:regular rate and rhythm, without any murmurs or gallops.   Lung: Clear to auscultation without any rhonchi, wheezes or dullness to percussion. Abdomin: Soft, nontender without any shifting dullness or ascites. Musculoskeletal: No clubbing or cyanosis. Neurological: No motor or sensory deficits. Skin: No rashes or lesions.                           Lab Results: Lab Results  Component Value Date   WBC 7.4 02/27/2021   HGB 14.0 02/27/2021   HCT 41.7 02/27/2021   MCV 92.3 02/27/2021   PLT 360 02/27/2021     Chemistry      Component Value Date/Time   NA 139 11/30/2020 1051   NA 135 (L) 06/11/2017 0753   K 4.4 11/30/2020 1051   K 4.0 06/11/2017 0753   CL 104 11/30/2020 1051   CO2 26 11/30/2020 1051   CO2 26 06/11/2017 0753   BUN 10 11/30/2020 1051   BUN 12.8 06/11/2017 0753   CREATININE 0.75 11/30/2020 1051   CREATININE 0.8 06/11/2017 0753      Component Value Date/Time   CALCIUM 9.2 11/30/2020 1051   CALCIUM 8.8 06/11/2017 0753   ALKPHOS 57 11/30/2020 1051   ALKPHOS 72 06/11/2017 0753   AST 18 11/30/2020 1051   AST 21 06/11/2017 0753   ALT 12 11/30/2020 1051   ALT 13 06/11/2017 0753   BILITOT 0.5 11/30/2020 1051   BILITOT 0.35 06/11/2017 0753       Results for GEORGEANN, BRINKMAN (MRN [id number]) as of 02/27/2021 10:09  Ref. Range 11/02/2020 09:25 11/30/2020 10:52  M Protein SerPl Elph-Mcnc Latest Ref Range: Not Observed g/dL 0.2 (H) 0.1 (H)  IFE 1 Unknown Comment (A) Comment (A)  Globulin, Total Latest Ref Range: 2.2 - 3.9 g/dL 2.5 2.3  B-Globulin SerPl Elph-Mcnc Latest Ref Range: 0.7 - 1.3 g/dL 0.9 0.9  IgG (Immunoglobin G), Serum Latest Ref Range: 586 - 1,602 mg/dL 435 (L) 446 (L)  IgM (Immunoglobulin M), Srm Latest Ref Range: 26 - 217 mg/dL 28 25 (L)  IgA  Latest Ref Range: 64 - 422 mg/dL 27 (L) 24 (L)      Impression and Plan:  80 year old woman with:  1.  IgG lambda multiple myeloma arising from MGUS diagnosed in 2019.   The natural course of her disease was reviewed and management choices were discussed at this time.  He is currently on maintenance daratumumab every 3 months without any major complaints.  Protein studies obtained in April 2022 continues to show excellent response with M spike down to 0.1 g/dL.  Risks and benefits of continuing this treatment were discussed.  Different salvage therapy options were reiterated  at this time.  At this time she is agreeable to continue.  2. Osteoporosis and compression fracture: She is off Zometa at this time based on her wishes.  No recent pathological fractures.   3.  Pain: Related to osteoporosis and overall manageable.  4.  Rash: Related to multiple myeloma and treatment.  She is using triamcinolone cream which was refilled for her.  5. Follow-up: She will follow-up in 3 months for repeat evaluation.  30  minutes were spent on this visit.  Time was dedicated to reviewing laboratory data, disease status update, management was future plan of care discussion.  Zola Button, MD 7/20/20229:49 AM

## 2021-02-27 NOTE — Patient Instructions (Signed)
[facility] ONCOL[facility]  Discharge Instructions: Thank you for choosing [facility] to provide your oncology and hematology care.   If you have a lab appointment with the [facility], please go directly to the [facility] and check in at the registration area.   Wear comfortable clothing and clothing appropriate for easy access to any Portacath or PICC line.   We strive to give you quality time with your provider. You may need to reschedule your appointment if you arrive late (15 or more minutes).  Arriving late affects you and other patients whose appointments are after yours.  Also, if you miss three or more appointments without notifying the office, you may be dismissed from the clinic at the provider's discretion.      For prescription refill requests, have your pharmacy contact our office and allow 72 hours for refills to be completed.    Today you received the following chemotherapy and/or immunotherapy agents Daratunumab-Hyaluronidase-fihj SubQ Injection      To help prevent nausea and vomiting after your treatment, we encourage you to take your nausea medication as directed.  BELOW ARE SYMPTOMS THAT SHOULD BE REPORTED IMMEDIATELY: *FEVER GREATER THAN 100.4 F (38 C) OR HIGHER *CHILLS OR SWEATING *NAUSEA AND VOMITING THAT IS NOT CONTROLLED WITH YOUR NAUSEA MEDICATION *UNUSUAL SHORTNESS OF BREATH *UNUSUAL BRUISING OR BLEEDING *URINARY PROBLEMS (pain or burning when urinating, or frequent urination) *BOWEL PROBLEMS (unusual diarrhea, constipation, pain near the anus) TENDERNESS IN MOUTH AND THROAT WITH OR WITHOUT PRESENCE OF ULCERS (sore throat, sores in mouth, or a toothache) UNUSUAL RASH, SWELLING OR PAIN  UNUSUAL VAGINAL DISCHARGE OR ITCHING   Items with * indicate a potential emergency and should be followed up as soon as possible or go to the Emergency Department if any problems should occur.  Please show the CHEMOTHERAPY ALERT CARD or  IMMUNOTHERAPY ALERT CARD at check-in to the Emergency Department and triage nurse.  Should you have questions after your visit or need to cancel or reschedule your appointment, please contact [facility]  Dept: [phone]  and follow the prompts.  Office hours are 8:00 a.m. to 4:30 p.m. Monday - Friday. Please note that voicemails left after 4:00 p.m. may not be returned until the following business day.  We are closed weekends and major holidays. You have access to a nurse at all times for urgent questions. Please call the main number to the clinic Dept: [phone] and follow the prompts.   For any non-urgent questions, you may also contact your provider using MyChart. We now offer e-Visits for anyone 42 and older to request care online for non-urgent symptoms. For details visit [email or web address].   Also download the MyChart app! Go to the app store, search "MyChart", open the app, select [facility], and log in with your MyChart username and password.  Due to Covid, a mask is required upon entering the hospital/clinic. If you do not have a mask, one will be given to you upon arrival. For doctor visits, patients may have 1 support person aged 83 or older with them. For treatment visits, patients cannot have anyone with them due to current Covid guidelines and our immunocompromised population.

## 2021-02-28 LAB — KAPPA/LAMBDA LIGHT CHAINS
Kappa free light chain: 4.8 mg/L (ref 3.3–19.4)
Kappa, lambda light chain ratio: 1.37 (ref 0.26–1.65)
Lambda free light chains: 3.5 mg/L — ABNORMAL LOW (ref 5.7–26.3)

## 2021-03-04 LAB — MULTIPLE MYELOMA PANEL, SERUM
Albumin SerPl Elph-Mcnc: 3.4 g/dL (ref 2.9–4.4)
Albumin/Glob SerPl: 1.5 (ref 0.7–1.7)
Alpha 1: 0.2 g/dL (ref 0.0–0.4)
Alpha2 Glob SerPl Elph-Mcnc: 0.8 g/dL (ref 0.4–1.0)
B-Globulin SerPl Elph-Mcnc: 0.9 g/dL (ref 0.7–1.3)
Gamma Glob SerPl Elph-Mcnc: 0.4 g/dL (ref 0.4–1.8)
Globulin, Total: 2.3 g/dL (ref 2.2–3.9)
IgA: 29 mg/dL — ABNORMAL LOW (ref 64–422)
IgG (Immunoglobin G), Serum: 449 mg/dL — ABNORMAL LOW (ref 586–1602)
IgM (Immunoglobulin M), Srm: 31 mg/dL (ref 26–217)
M Protein SerPl Elph-Mcnc: 0.2 g/dL — ABNORMAL HIGH
Total Protein ELP: 5.7 g/dL — ABNORMAL LOW (ref 6.0–8.5)

## 2021-03-06 DIAGNOSIS — C44622 Squamous cell carcinoma of skin of right upper limb, including shoulder: Secondary | ICD-10-CM | POA: Diagnosis not present

## 2021-03-06 DIAGNOSIS — C44722 Squamous cell carcinoma of skin of right lower limb, including hip: Secondary | ICD-10-CM | POA: Diagnosis not present

## 2021-05-08 ENCOUNTER — Inpatient Hospital Stay: Payer: Medicare Other

## 2021-05-08 ENCOUNTER — Inpatient Hospital Stay: Payer: Medicare Other | Attending: Oncology

## 2021-05-08 ENCOUNTER — Other Ambulatory Visit: Payer: Self-pay

## 2021-05-08 ENCOUNTER — Inpatient Hospital Stay (HOSPITAL_BASED_OUTPATIENT_CLINIC_OR_DEPARTMENT_OTHER): Payer: Medicare Other | Admitting: Oncology

## 2021-05-08 VITALS — BP 146/81

## 2021-05-08 VITALS — BP 130/111 | HR 71 | Temp 98.0°F | Resp 17 | Wt 133.9 lb

## 2021-05-08 DIAGNOSIS — Z5112 Encounter for antineoplastic immunotherapy: Secondary | ICD-10-CM | POA: Insufficient documentation

## 2021-05-08 DIAGNOSIS — Z79899 Other long term (current) drug therapy: Secondary | ICD-10-CM | POA: Insufficient documentation

## 2021-05-08 DIAGNOSIS — C9000 Multiple myeloma not having achieved remission: Secondary | ICD-10-CM

## 2021-05-08 LAB — CMP (CANCER CENTER ONLY)
ALT: 14 U/L (ref 0–44)
AST: 18 U/L (ref 15–41)
Albumin: 3.7 g/dL (ref 3.5–5.0)
Alkaline Phosphatase: 63 U/L (ref 38–126)
Anion gap: 11 (ref 5–15)
BUN: 10 mg/dL (ref 8–23)
CO2: 23 mmol/L (ref 22–32)
Calcium: 9.3 mg/dL (ref 8.9–10.3)
Chloride: 105 mmol/L (ref 98–111)
Creatinine: 0.78 mg/dL (ref 0.44–1.00)
GFR, Estimated: >60 mL/min (ref >60)
Glucose, Bld: 113 mg/dL — ABNORMAL HIGH (ref 70–99)
Potassium: 4.5 mmol/L (ref 3.5–5.1)
Sodium: 139 mmol/L (ref 135–145)
Total Bilirubin: 0.5 mg/dL (ref 0.3–1.2)
Total Protein: 6.1 g/dL — ABNORMAL LOW (ref 6.5–8.1)

## 2021-05-08 LAB — CBC WITH DIFFERENTIAL (CANCER CENTER ONLY)
Abs Immature Granulocytes: 0.03 10*3/uL (ref 0.00–0.07)
Basophils Absolute: 0 10*3/uL (ref 0.0–0.1)
Basophils Relative: 1 %
Eosinophils Absolute: 0.1 10*3/uL (ref 0.0–0.5)
Eosinophils Relative: 1 %
HCT: 41.5 % (ref 36.0–46.0)
Hemoglobin: 13.8 g/dL (ref 12.0–15.0)
Immature Granulocytes: 0 %
Lymphocytes Relative: 24 %
Lymphs Abs: 1.8 10*3/uL (ref 0.7–4.0)
MCH: 30.8 pg (ref 26.0–34.0)
MCHC: 33.3 g/dL (ref 30.0–36.0)
MCV: 92.6 fL (ref 80.0–100.0)
Monocytes Absolute: 0.7 10*3/uL (ref 0.1–1.0)
Monocytes Relative: 9 %
Neutro Abs: 4.9 10*3/uL (ref 1.7–7.7)
Neutrophils Relative %: 65 %
Platelet Count: 332 10*3/uL (ref 150–400)
RBC: 4.48 MIL/uL (ref 3.87–5.11)
RDW: 13.5 % (ref 11.5–15.5)
WBC Count: 7.5 10*3/uL (ref 4.0–10.5)
nRBC: 0 % (ref 0.0–0.2)

## 2021-05-08 MED ORDER — DEXAMETHASONE 4 MG PO TABS
20.0000 mg | ORAL_TABLET | Freq: Once | ORAL | Status: AC
  Administered 2021-05-08: 20 mg via ORAL
  Filled 2021-05-08: qty 5

## 2021-05-08 MED ORDER — DARATUMUMAB-HYALURONIDASE-FIHJ 1800-30000 MG-UT/15ML ~~LOC~~ SOLN
1800.0000 mg | Freq: Once | SUBCUTANEOUS | Status: AC
  Administered 2021-05-08: 1800 mg via SUBCUTANEOUS
  Filled 2021-05-08: qty 15

## 2021-05-08 MED ORDER — ACETAMINOPHEN 325 MG PO TABS
650.0000 mg | ORAL_TABLET | Freq: Once | ORAL | Status: AC
  Administered 2021-05-08: 650 mg via ORAL
  Filled 2021-05-08: qty 2

## 2021-05-08 MED ORDER — DIPHENHYDRAMINE HCL 25 MG PO CAPS
50.0000 mg | ORAL_CAPSULE | Freq: Once | ORAL | Status: AC
  Administered 2021-05-08: 50 mg via ORAL
  Filled 2021-05-08: qty 2

## 2021-05-08 NOTE — Progress Notes (Signed)
Hematology and Oncology Follow Up Visit  Nicole Keith [id number] 03/31/1941 80 y.o. 05/08/2021 11:44 AM Nicole Keith, [name], [name], MD   Principle Diagnosis: 80 year old woman with multiple myeloma diagnosed 2019 arising from MGUS.  He presented with IgG lambda and 29% involvement.  Bone marrow.  Past therapy:    Velcade and dexamethasone started weekly on 07/27/2018.  Therapy changed in February 2020.  Revlimid 25 mg for 21 days out of a 28-day cycle with dexamethasone started in February 2020.  Therapy was held after 2 cycles because of severe dermatological toxicity.  Pomalyst 1 mg daily for 21 days started on May 1 of 2020.  She completed 4 cycles of therapy.   Ninlaro 4 mg weekly with dexamethasone 20 mg weekly for 3 weeks on and 1 week off started in December 2020.  Daratumumab was added on December 02, 2019.  Ninlaro discontinued in November 2021.  Current therapy:  Daratumumab monthly starting May 18, 2020.  Therapy changed to every 3 months starting April 2022.  She is here for the next cycle of therapy.  Interim History:  Nicole Keith is here for a follow-up evaluation.  Since her last visit, she reports no major changes in her health.  She denies any recent hospitalizations or illnesses.  She denies any bone pain pathological fractures.  She continues to ambulate with the help of cane without any falls or syncope.  She did develop to skin lesions and has been removed and continues to follow with dermatology.         Medications: Updated on review. Current Outpatient Medications  Medication Sig Dispense Refill   acetaminophen (TYLENOL) 500 MG tablet Take 500 mg by mouth in the morning, at noon, and at bedtime.      diphenhydramine-acetaminophen (TYLENOL PM) 25-500 MG TABS tablet Take 2 tablets by mouth at bedtime as needed (for sleep).      famotidine-calcium carbonate-magnesium hydroxide (PEPCID COMPLETE) 10-800-165 MG chewable tablet Chew 1 tablet by mouth daily as  needed (for heartburn or indigestion).      ibuprofen (ADVIL) 400 MG tablet Take 400 mg by mouth every 6 (six) hours.     magic mouthwash SOLN Take 5 mLs by mouth 3 (three) times daily as needed for mouth pain. 30 mL 0   Polyethyl Glycol-Propyl Glycol (SYSTANE) 0.4-0.3 % SOLN Place 1 drop into both eyes in the morning and at bedtime.      triamcinolone cream (KENALOG) 0.1 % Apply to the affected areas on the skin 2 times a day 454 g 3   No current facility-administered medications for this visit.     Allergies:  Allergies  Allergen Reactions   Pomalyst [Pomalidomide] Rash and Other (See Comments)    ENTIRE BODY BROKE OUT IN A RASH   Revlimid [Lenalidomide] Rash and Other (See Comments)    WHOLE BODY BLISTERED   Betadine [Povidone Iodine] Rash   Flagyl [Metronidazole] Diarrhea   Fosamax [Alendronate Sodium] Other (See Comments)    GI distress   Bactrim [Sulfamethoxazole-Trimethoprim] Nausea Only   Clindamycin/Lincomycin     Pt can't remember reaction type.   Doxepin Hcl Other (See Comments)    Irritable, hallucinations   Hydrocodone-Acetaminophen     Restlessness   Hydroxyzine Other (See Comments)    Hallucinations   Hydroxyzine Hcl Other (See Comments)    Hallucinations    Tramadol Hcl    Valium [Diazepam] Other (See Comments)    Reaction not recalled   Actonel [Risedronate Sodium] Other (See Comments)  GI distress   Boniva [Ibandronic Acid] Other (See Comments)    GI distress   Doxycycline Rash   Penicillins Rash    Ancef was OK      Physical Exam:     Blood pressure (!) 130/111, pulse 71, temperature 98 F (36.7 C), temperature source Oral, resp. rate 17, weight 133 lb 14.4 oz (60.7 kg), SpO2 98 %.     ECOG: 1    General appearance: Comfortable appearing without any discomfort Head: Normocephalic without any trauma Oropharynx: Mucous membranes are moist and pink without any thrush or ulcers. Eyes: Pupils are equal and round reactive to  light. Lymph nodes: No cervical, supraclavicular, inguinal or axillary lymphadenopathy.   Heart:regular rate and rhythm.  S1 and S2 without leg edema. Lung: Clear without any rhonchi or wheezes.  No dullness to percussion. Abdomin: Soft, nontender, nondistended with good bowel sounds.  No hepatosplenomegaly. Musculoskeletal: No joint deformity or effusion.  Full range of motion noted. Neurological: No deficits noted on motor, sensory and deep tendon reflex exam. Skin: No petechial rash or dryness.  Appeared moist.                             Lab Results: Lab Results  Component Value Date   WBC 7.5 05/08/2021   HGB 13.8 05/08/2021   HCT 41.5 05/08/2021   MCV 92.6 05/08/2021   PLT 332 05/08/2021     Chemistry      Component Value Date/Time   NA 140 02/27/2021 0935   NA 135 (L) 06/11/2017 0753   K 4.3 02/27/2021 0935   K 4.0 06/11/2017 0753   CL 102 02/27/2021 0935   CO2 28 02/27/2021 0935   CO2 26 06/11/2017 0753   BUN 12 02/27/2021 0935   BUN 12.8 06/11/2017 0753   CREATININE 0.67 02/27/2021 0935   CREATININE 0.8 06/11/2017 0753      Component Value Date/Time   CALCIUM 9.5 02/27/2021 0935   CALCIUM 8.8 06/11/2017 0753   ALKPHOS 59 02/27/2021 0935   ALKPHOS 72 06/11/2017 0753   AST 22 02/27/2021 0935   AST 21 06/11/2017 0753   ALT 18 02/27/2021 0935   ALT 13 06/11/2017 0753   BILITOT 0.4 02/27/2021 0935   BILITOT 0.35 06/11/2017 0753       Results for Nicole Keith (MRN [id number]) as of 05/08/2021 11:45  Ref. Range 11/30/2020 10:52 02/27/2021 09:35  M Protein SerPl Elph-Mcnc Latest Ref Range: Not Observed g/dL 0.1 (H) 0.2 (H)       Impression and Plan:  80 year old woman with:  1.  Multiple myeloma diagnosed in 2019.  She was found to have IgG lambda subtype arising from MGUS.  Her disease status was updated at this time.  M spike continues to be low although she did have a small rise in July 2022.  Risks and benefits of continuing  this treatment were discussed at this time.  Alternative salvage therapy options including Kyprolis among others were reviewed.  After discussion today, we opted to continue with maintenance daratumumab every 2 to 3 months.  2. Osteoporosis and compression fracture: I recommended calcium and vitamin D supplements.  Zometa has been deferred based on her wishes.   3.  Back pain: Related to compression fracture noted overall manageable.  She takes Tylenol ibuprofen infrequently.  4.  Dermatological issues: She continues to follow with dermatology regarding the skin rash and skin cancer.  5. Follow-up: In 3  months for repeat evaluation.  30  minutes were dedicated to this visit.  The time spent on reviewing laboratory data, disease status update and outlining future plan of care discussion and management.  Zola Button, MD 9/28/202211:44 AM

## 2021-05-08 NOTE — Patient Instructions (Signed)
[facility]  Discharge Instructions: °Thank you for choosing [facility] to provide your oncology and hematology care.  ° °If you have a lab appointment with the Cancer Center, please go directly to the Cancer Center and check in at the registration area. °  °Wear comfortable clothing and clothing appropriate for easy access to any Portacath or PICC line.  ° °We strive to give you quality time with your provider. You may need to reschedule your appointment if you arrive late (15 or more minutes).  Arriving late affects you and other patients whose appointments are after yours.  Also, if you miss three or more appointments without notifying the office, you may be dismissed from the clinic at the provider’s discretion.    °  °For prescription refill requests, have your pharmacy contact our office and allow 72 hours for refills to be completed.   ° °Today you received the following chemotherapy and/or immunotherapy agents: Darzalex  °  °To help prevent nausea and vomiting after your treatment, we encourage you to take your nausea medication as directed. ° °BELOW ARE SYMPTOMS THAT SHOULD BE REPORTED IMMEDIATELY: °*FEVER GREATER THAN 100.4 F (38 °C) OR HIGHER °*CHILLS OR SWEATING °*NAUSEA AND VOMITING THAT IS NOT CONTROLLED WITH YOUR NAUSEA MEDICATION °*UNUSUAL SHORTNESS OF BREATH °*UNUSUAL BRUISING OR BLEEDING °*URINARY PROBLEMS (pain or burning when urinating, or frequent urination) °*BOWEL PROBLEMS (unusual diarrhea, constipation, pain near the anus) °TENDERNESS IN MOUTH AND THROAT WITH OR WITHOUT PRESENCE OF ULCERS (sore throat, sores in mouth, or a toothache) °UNUSUAL RASH, SWELLING OR PAIN  °UNUSUAL VAGINAL DISCHARGE OR ITCHING  ° °Items with * indicate a potential emergency and should be followed up as soon as possible or go to the Emergency Department if any problems should occur. ° °Please show the CHEMOTHERAPY ALERT CARD or IMMUNOTHERAPY ALERT CARD at check-in to the  Emergency Department and triage nurse. ° °Should you have questions after your visit or need to cancel or reschedule your appointment, please contact [facility]  Dept: [phone]  and follow the prompts.  Office hours are 8:00 a.m. to 4:30 p.m. Monday - Friday. Please note that voicemails left after 4:00 p.m. may not be returned until the following business day.  We are closed weekends and major holidays. You have access to a nurse at all times for urgent questions. Please call the main number to the clinic Dept: [phone] and follow the prompts. ° ° °For any non-urgent questions, you may also contact your provider using MyChart. We now offer e-Visits for anyone 18 and older to request care online for non-urgent symptoms. For details visit [email or web address]. °  °Also download the MyChart app! Go to the app store, search "MyChart", open the app, select [place], and log in with your MyChart username and password. ° °Due to Covid, a mask is required upon entering the hospital/clinic. If you do not have a mask, one will be given to you upon arrival. For doctor visits, patients may have 1 support person aged 18 or older with them. For treatment visits, patients cannot have anyone with them due to current Covid guidelines and our immunocompromised population.  ° °

## 2021-05-09 LAB — KAPPA/LAMBDA LIGHT CHAINS
Kappa free light chain: 5.4 mg/L (ref 3.3–19.4)
Kappa, lambda light chain ratio: 1.32 (ref 0.26–1.65)
Lambda free light chains: 4.1 mg/L — ABNORMAL LOW (ref 5.7–26.3)

## 2021-05-13 LAB — MULTIPLE MYELOMA PANEL, SERUM
Albumin SerPl Elph-Mcnc: 3.5 g/dL (ref 2.9–4.4)
Albumin/Glob SerPl: 1.7 (ref 0.7–1.7)
Alpha 1: 0.2 g/dL (ref 0.0–0.4)
Alpha2 Glob SerPl Elph-Mcnc: 0.7 g/dL (ref 0.4–1.0)
B-Globulin SerPl Elph-Mcnc: 0.8 g/dL (ref 0.7–1.3)
Gamma Glob SerPl Elph-Mcnc: 0.4 g/dL (ref 0.4–1.8)
Globulin, Total: 2.1 g/dL — ABNORMAL LOW (ref 2.2–3.9)
IgA: 28 mg/dL — ABNORMAL LOW (ref 64–422)
IgG (Immunoglobin G), Serum: 417 mg/dL — ABNORMAL LOW (ref 586–1602)
IgM (Immunoglobulin M), Srm: 27 mg/dL (ref 26–217)
M Protein SerPl Elph-Mcnc: 0.2 g/dL — ABNORMAL HIGH
Total Protein ELP: 5.6 g/dL — ABNORMAL LOW (ref 6.0–8.5)

## 2021-07-11 NOTE — Progress Notes (Signed)
Ok to treat a few weeks early on 07/16/21 per Dr. Alen Blew.  Raul Del [place], [place], BCPS, BCOP 07/11/2021 3:30 PM

## 2021-07-16 ENCOUNTER — Inpatient Hospital Stay (HOSPITAL_BASED_OUTPATIENT_CLINIC_OR_DEPARTMENT_OTHER): Payer: Medicare Other | Admitting: Oncology

## 2021-07-16 ENCOUNTER — Inpatient Hospital Stay: Payer: Medicare Other | Attending: Oncology

## 2021-07-16 ENCOUNTER — Other Ambulatory Visit: Payer: Self-pay

## 2021-07-16 ENCOUNTER — Inpatient Hospital Stay: Payer: Medicare Other

## 2021-07-16 VITALS — BP 138/79 | HR 70 | Temp 97.3°F | Resp 17 | Wt 134.4 lb

## 2021-07-16 DIAGNOSIS — D472 Monoclonal gammopathy: Secondary | ICD-10-CM | POA: Diagnosis not present

## 2021-07-16 DIAGNOSIS — C9000 Multiple myeloma not having achieved remission: Secondary | ICD-10-CM | POA: Insufficient documentation

## 2021-07-16 DIAGNOSIS — Z5112 Encounter for antineoplastic immunotherapy: Secondary | ICD-10-CM | POA: Diagnosis not present

## 2021-07-16 DIAGNOSIS — Z79899 Other long term (current) drug therapy: Secondary | ICD-10-CM | POA: Insufficient documentation

## 2021-07-16 LAB — CMP (CANCER CENTER ONLY)
ALT: 13 U/L (ref 0–44)
AST: 18 U/L (ref 15–41)
Albumin: 3.9 g/dL (ref 3.5–5.0)
Alkaline Phosphatase: 61 U/L (ref 38–126)
Anion gap: 9 (ref 5–15)
BUN: 13 mg/dL (ref 8–23)
CO2: 27 mmol/L (ref 22–32)
Calcium: 9 mg/dL (ref 8.9–10.3)
Chloride: 103 mmol/L (ref 98–111)
Creatinine: 0.8 mg/dL (ref 0.44–1.00)
GFR, Estimated: >60 mL/min (ref >60)
Glucose, Bld: 83 mg/dL (ref 70–99)
Potassium: 4 mmol/L (ref 3.5–5.1)
Sodium: 139 mmol/L (ref 135–145)
Total Bilirubin: 0.5 mg/dL (ref 0.3–1.2)
Total Protein: 6.3 g/dL — ABNORMAL LOW (ref 6.5–8.1)

## 2021-07-16 LAB — CBC WITH DIFFERENTIAL (CANCER CENTER ONLY)
Abs Immature Granulocytes: 0.02 10*3/uL (ref 0.00–0.07)
Basophils Absolute: 0.1 10*3/uL (ref 0.0–0.1)
Basophils Relative: 1 %
Eosinophils Absolute: 0.1 10*3/uL (ref 0.0–0.5)
Eosinophils Relative: 1 %
HCT: 42.5 % (ref 36.0–46.0)
Hemoglobin: 13.9 g/dL (ref 12.0–15.0)
Immature Granulocytes: 0 %
Lymphocytes Relative: 26 %
Lymphs Abs: 2 10*3/uL (ref 0.7–4.0)
MCH: 30.5 pg (ref 26.0–34.0)
MCHC: 32.7 g/dL (ref 30.0–36.0)
MCV: 93.4 fL (ref 80.0–100.0)
Monocytes Absolute: 0.6 10*3/uL (ref 0.1–1.0)
Monocytes Relative: 8 %
Neutro Abs: 4.9 10*3/uL (ref 1.7–7.7)
Neutrophils Relative %: 64 %
Platelet Count: 351 10*3/uL (ref 150–400)
RBC: 4.55 MIL/uL (ref 3.87–5.11)
RDW: 13.4 % (ref 11.5–15.5)
WBC Count: 7.7 10*3/uL (ref 4.0–10.5)
nRBC: 0 % (ref 0.0–0.2)

## 2021-07-16 MED ORDER — ACETAMINOPHEN 325 MG PO TABS
650.0000 mg | ORAL_TABLET | Freq: Once | ORAL | Status: AC
  Administered 2021-07-16: 650 mg via ORAL
  Filled 2021-07-16: qty 2

## 2021-07-16 MED ORDER — DARATUMUMAB-HYALURONIDASE-FIHJ 1800-30000 MG-UT/15ML ~~LOC~~ SOLN
1800.0000 mg | Freq: Once | SUBCUTANEOUS | Status: AC
  Administered 2021-07-16: 1800 mg via SUBCUTANEOUS
  Filled 2021-07-16: qty 15

## 2021-07-16 MED ORDER — DIPHENHYDRAMINE HCL 25 MG PO CAPS
50.0000 mg | ORAL_CAPSULE | Freq: Once | ORAL | Status: AC
  Administered 2021-07-16: 50 mg via ORAL
  Filled 2021-07-16: qty 2

## 2021-07-16 MED ORDER — DEXAMETHASONE 4 MG PO TABS
20.0000 mg | ORAL_TABLET | Freq: Once | ORAL | Status: AC
  Administered 2021-07-16: 20 mg via ORAL
  Filled 2021-07-16: qty 5

## 2021-07-16 NOTE — Patient Instructions (Signed)
[facility]  Discharge Instructions: Thank you for choosing [facility] to provide your oncology and hematology care.   If you have a lab appointment with the Cancer Center, please go directly to the Cancer Center and check in at the registration area.   Wear comfortable clothing and clothing appropriate for easy access to any Portacath or PICC line.   We strive to give you quality time with your provider. You may need to reschedule your appointment if you arrive late (15 or more minutes).  Arriving late affects you and other patients whose appointments are after yours.  Also, if you miss three or more appointments without notifying the office, you may be dismissed from the clinic at the provider's discretion.      For prescription refill requests, have your pharmacy contact our office and allow 72 hours for refills to be completed.    Today you received the following chemotherapy and/or immunotherapy agents: Darzalex Faspro      To help prevent nausea and vomiting after your treatment, we encourage you to take your nausea medication as directed.  BELOW ARE SYMPTOMS THAT SHOULD BE REPORTED IMMEDIATELY: *FEVER GREATER THAN 100.4 F (38 C) OR HIGHER *CHILLS OR SWEATING *NAUSEA AND VOMITING THAT IS NOT CONTROLLED WITH YOUR NAUSEA MEDICATION *UNUSUAL SHORTNESS OF BREATH *UNUSUAL BRUISING OR BLEEDING *URINARY PROBLEMS (pain or burning when urinating, or frequent urination) *BOWEL PROBLEMS (unusual diarrhea, constipation, pain near the anus) TENDERNESS IN MOUTH AND THROAT WITH OR WITHOUT PRESENCE OF ULCERS (sore throat, sores in mouth, or a toothache) UNUSUAL RASH, SWELLING OR PAIN  UNUSUAL VAGINAL DISCHARGE OR ITCHING   Items with * indicate a potential emergency and should be followed up as soon as possible or go to the Emergency Department if any problems should occur.  Please show the CHEMOTHERAPY ALERT CARD or IMMUNOTHERAPY ALERT CARD at  check-in to the Emergency Department and triage nurse.  Should you have questions after your visit or need to cancel or reschedule your appointment, please contact [facility]  Dept: [phone]  and follow the prompts.  Office hours are 8:00 a.m. to 4:30 p.m. Monday - Friday. Please note that voicemails left after 4:00 p.m. may not be returned until the following business day.  We are closed weekends and major holidays. You have access to a nurse at all times for urgent questions. Please call the main number to the clinic Dept: [phone] and follow the prompts.   For any non-urgent questions, you may also contact your provider using MyChart. We now offer e-Visits for anyone 18 and older to request care online for non-urgent symptoms. For details visit [email or web address].   Also download the MyChart app! Go to the app store, search "MyChart", open the app, select [place], and log in with your MyChart username and password.  Due to Covid, a mask is required upon entering the hospital/clinic. If you do not have a mask, one will be given to you upon arrival. For doctor visits, patients may have 1 support person aged 18 or older with them. For treatment visits, patients cannot have anyone with them due to current Covid guidelines and our immunocompromised population.  

## 2021-07-16 NOTE — Progress Notes (Signed)
Hematology and Oncology Follow Up Visit  Nicole Keith [id number] 03/09/1941 80 y.o. 07/16/2021 10:38 AM Keith, Nicole, [name], Aaron, MD   Principle Diagnosis: 80-year-old woman with IgG lambda multiple myeloma diagnosed 2019 arising from MGUS.   Past therapy:    Velcade and dexamethasone started weekly on 07/27/2018.  Therapy changed in February 2020.  Revlimid 25 mg for 21 days out of a 28-day cycle with dexamethasone started in February 2020.  Therapy was held after 2 cycles because of severe dermatological toxicity.  Pomalyst 1 mg daily for 21 days started on May 1 of 2020.  She completed 4 cycles of therapy.   Ninlaro 4 mg weekly with dexamethasone 20 mg weekly for 3 weeks on and 1 week off started in December 2020.  Daratumumab was added on December 02, 2019.  Ninlaro discontinued in November 2021.  Current therapy:  Daratumumab monthly starting May 18, 2020.  Therapy changed to every 3 months starting April 2022.  She presents today for next treatment.  Interim History:  Nicole Keith is here for a follow-up visit.  Since her last visit, she reports feeling well without any major complaints.  She denies any nausea, vomiting or abdominal pain.  She denies any bone pain or pathological fractures.  She denies any hospitalizations or illnesses.  He continues to be reasonably ambulatory with the help of a cane without any falls or syncope.         Medications: Reviewed without changes. Current Outpatient Medications  Medication Sig Dispense Refill   acetaminophen (TYLENOL) 500 MG tablet Take 500 mg by mouth in the morning, at noon, and at bedtime.      diphenhydramine-acetaminophen (TYLENOL PM) 25-500 MG TABS tablet Take 2 tablets by mouth at bedtime as needed (for sleep).      famotidine-calcium carbonate-magnesium hydroxide (PEPCID COMPLETE) 10-800-165 MG chewable tablet Chew 1 tablet by mouth daily as needed (for heartburn or indigestion).      ibuprofen (ADVIL) 400 MG  tablet Take 400 mg by mouth every 6 (six) hours.     magic mouthwash SOLN Take 5 mLs by mouth 3 (three) times daily as needed for mouth pain. 30 mL 0   Polyethyl Glycol-Propyl Glycol (SYSTANE) 0.4-0.3 % SOLN Place 1 drop into both eyes in the morning and at bedtime.      triamcinolone cream (KENALOG) 0.1 % Apply to the affected areas on the skin 2 times a day 454 g 3   No current facility-administered medications for this visit.     Allergies:  Allergies  Allergen Reactions   Pomalyst [Pomalidomide] Rash and Other (See Comments)    ENTIRE BODY BROKE OUT IN A RASH   Revlimid [Lenalidomide] Rash and Other (See Comments)    WHOLE BODY BLISTERED   Betadine [Povidone Iodine] Rash   Flagyl [Metronidazole] Diarrhea   Fosamax [Alendronate Sodium] Other (See Comments)    GI distress   Bactrim [Sulfamethoxazole-Trimethoprim] Nausea Only   Clindamycin/Lincomycin     Pt can't remember reaction type.   Doxepin Hcl Other (See Comments)    Irritable, hallucinations   Hydrocodone-Acetaminophen     Restlessness   Hydroxyzine Other (See Comments)    Hallucinations   Hydroxyzine Hcl Other (See Comments)    Hallucinations    Tramadol Hcl    Valium [Diazepam] Other (See Comments)    Reaction not recalled   Actonel [Risedronate Sodium] Other (See Comments)    GI distress   Boniva [Ibandronic Acid] Other (See Comments)    GI   distress   Doxycycline Rash   Penicillins Rash    Ancef was OK      Physical Exam:     Blood pressure 138/79, pulse 70, temperature (!) 97.3 F (36.3 C), temperature source Tympanic, resp. rate 17, weight 134 lb 6.4 oz (61 kg), SpO2 98 %.     ECOG: 1    General appearance: Alert, awake without any distress. Head: Atraumatic without abnormalities Oropharynx: Without any thrush or ulcers. Eyes: No scleral icterus. Lymph nodes: No lymphadenopathy noted in the cervical, supraclavicular, or axillary nodes Heart:regular rate and rhythm, without any murmurs or  gallops.   Lung: Clear to auscultation without any rhonchi, wheezes or dullness to percussion. Abdomin: Soft, nontender without any shifting dullness or ascites. Musculoskeletal: No clubbing or cyanosis. Neurological: No motor or sensory deficits. Skin: No rashes or lesions.                              Lab Results: Lab Results  Component Value Date   WBC 7.7 07/16/2021   HGB 13.9 07/16/2021   HCT 42.5 07/16/2021   MCV 93.4 07/16/2021   PLT 351 07/16/2021     Chemistry      Component Value Date/Time   NA 139 07/16/2021 0951   NA 135 (L) 06/11/2017 0753   K 4.0 07/16/2021 0951   K 4.0 06/11/2017 0753   CL 103 07/16/2021 0951   CO2 27 07/16/2021 0951   CO2 26 06/11/2017 0753   BUN 13 07/16/2021 0951   BUN 12.8 06/11/2017 0753   CREATININE 0.80 07/16/2021 0951   CREATININE 0.8 06/11/2017 0753      Component Value Date/Time   CALCIUM 9.0 07/16/2021 0951   CALCIUM 8.8 06/11/2017 0753   ALKPHOS 61 07/16/2021 0951   ALKPHOS 72 06/11/2017 0753   AST 18 07/16/2021 0951   AST 21 06/11/2017 0753   ALT 13 07/16/2021 0951   ALT 13 06/11/2017 0753   BILITOT 0.5 07/16/2021 0951   BILITOT 0.35 06/11/2017 0753         Latest Reference Range & Units 05/08/21 11:32  M Protein SerPl Elph-Mcnc Not Observed g/dL 0.2 (H) (C)  IFE 1  Comment ! (C)  Globulin, Total 2.2 - 3.9 g/dL 2.1 (L) (C)  B-Globulin SerPl Elph-Mcnc 0.7 - 1.3 g/dL 0.8 (C)  IgG (Immunoglobin G), Serum 586 - 1,602 mg/dL 417 (L)  IgM (Immunoglobulin M), Srm 26 - 217 mg/dL 27  IgA 64 - 422 mg/dL 28 (L)  (H): Data is abnormally high !: Data is abnormal (L): Data is abnormally low (C): Corrected     Impression and Plan:  80 year old woman with:  1.  IgG lambda multiple myeloma diagnosed in 2019.  She initially was found to have MGUS.   She is currently on maintenance daratumumab without any major complaints.  Protein studies from September 2022 showed no evidence of relapsed  disease.  Risks and benefits of continuing this treatment were discussed.  Complications that include nausea, fatigue and infusion related issues were reviewed.  She is agreeable to continue at this time.  M spike remains very low asymptomatic at this time.  Her CBC was reviewed and continues to be normal.  2. Osteoporosis and compression fracture: She has deferred the option of Zometa at this time.   3.  Back pain: Manageable currently and takes Tylenol as needed.  4.  Dermatological issues: Very few issues noted at this time.  She uses triamcinolone cream effectively.  5. Follow-up: She will return in 3 months for a follow-up visit.  30  minutes were spent on this encounter.  The time was dedicated to reviewing laboratory data, disease status update, complications related to cancer and cancer therapy.  Zola Button, MD 12/6/202210:38 AM

## 2021-07-17 LAB — KAPPA/LAMBDA LIGHT CHAINS
Kappa free light chain: 4.5 mg/L (ref 3.3–19.4)
Kappa, lambda light chain ratio: 1.18 (ref 0.26–1.65)
Lambda free light chains: 3.8 mg/L — ABNORMAL LOW (ref 5.7–26.3)

## 2021-07-19 LAB — MULTIPLE MYELOMA PANEL, SERUM
Albumin SerPl Elph-Mcnc: 3.6 g/dL (ref 2.9–4.4)
Albumin/Glob SerPl: 1.6 (ref 0.7–1.7)
Alpha 1: 0.2 g/dL (ref 0.0–0.4)
Alpha2 Glob SerPl Elph-Mcnc: 0.8 g/dL (ref 0.4–1.0)
B-Globulin SerPl Elph-Mcnc: 0.9 g/dL (ref 0.7–1.3)
Gamma Glob SerPl Elph-Mcnc: 0.4 g/dL (ref 0.4–1.8)
Globulin, Total: 2.3 g/dL (ref 2.2–3.9)
IgA: 26 mg/dL — ABNORMAL LOW (ref 64–422)
IgG (Immunoglobin G), Serum: 433 mg/dL — ABNORMAL LOW (ref 586–1602)
IgM (Immunoglobulin M), Srm: 35 mg/dL (ref 26–217)
M Protein SerPl Elph-Mcnc: 0.2 g/dL — ABNORMAL HIGH
Total Protein ELP: 5.9 g/dL — ABNORMAL LOW (ref 6.0–8.5)

## 2021-08-09 ENCOUNTER — Ambulatory Visit: Payer: Medicare Other | Admitting: Oncology

## 2021-08-09 ENCOUNTER — Ambulatory Visit: Payer: Medicare Other

## 2021-08-09 ENCOUNTER — Other Ambulatory Visit: Payer: Medicare Other

## 2021-09-03 ENCOUNTER — Telehealth: Payer: Self-pay | Admitting: Oncology

## 2021-09-03 NOTE — Telephone Encounter (Signed)
Rescheduled 03/07 appointment time due to providers schedule, patient has been called and voicemail was left.

## 2021-09-04 ENCOUNTER — Other Ambulatory Visit (HOSPITAL_COMMUNITY): Payer: Self-pay

## 2021-09-06 ENCOUNTER — Telehealth: Payer: Self-pay | Admitting: Oncology

## 2021-09-06 NOTE — Telephone Encounter (Signed)
Called patient regarding upcoming [facility] appointments, patient has been called and voicemail was left.

## 2021-10-15 ENCOUNTER — Inpatient Hospital Stay: Payer: Medicare Other

## 2021-10-15 ENCOUNTER — Other Ambulatory Visit: Payer: Self-pay

## 2021-10-15 ENCOUNTER — Ambulatory Visit: Payer: Medicare Other

## 2021-10-15 ENCOUNTER — Encounter: Payer: Self-pay | Admitting: Oncology

## 2021-10-15 ENCOUNTER — Ambulatory Visit: Payer: Medicare Other | Admitting: Oncology

## 2021-10-15 ENCOUNTER — Inpatient Hospital Stay (HOSPITAL_BASED_OUTPATIENT_CLINIC_OR_DEPARTMENT_OTHER): Payer: Medicare Other | Admitting: Oncology

## 2021-10-15 ENCOUNTER — Other Ambulatory Visit: Payer: Medicare Other

## 2021-10-15 ENCOUNTER — Inpatient Hospital Stay: Payer: Medicare Other | Attending: Oncology

## 2021-10-15 VITALS — BP 140/76 | HR 74 | Temp 97.8°F | Resp 17 | Ht 66.0 in | Wt 138.6 lb

## 2021-10-15 DIAGNOSIS — Z79899 Other long term (current) drug therapy: Secondary | ICD-10-CM | POA: Diagnosis not present

## 2021-10-15 DIAGNOSIS — C9000 Multiple myeloma not having achieved remission: Secondary | ICD-10-CM

## 2021-10-15 DIAGNOSIS — Z5112 Encounter for antineoplastic immunotherapy: Secondary | ICD-10-CM | POA: Diagnosis not present

## 2021-10-15 LAB — CBC WITH DIFFERENTIAL (CANCER CENTER ONLY)
Abs Immature Granulocytes: 0.02 10*3/uL (ref 0.00–0.07)
Basophils Absolute: 0.1 10*3/uL (ref 0.0–0.1)
Basophils Relative: 1 %
Eosinophils Absolute: 0.2 10*3/uL (ref 0.0–0.5)
Eosinophils Relative: 3 %
HCT: 41.5 % (ref 36.0–46.0)
Hemoglobin: 13.7 g/dL (ref 12.0–15.0)
Immature Granulocytes: 0 %
Lymphocytes Relative: 20 %
Lymphs Abs: 1.7 10*3/uL (ref 0.7–4.0)
MCH: 30.6 pg (ref 26.0–34.0)
MCHC: 33 g/dL (ref 30.0–36.0)
MCV: 92.6 fL (ref 80.0–100.0)
Monocytes Absolute: 0.6 10*3/uL (ref 0.1–1.0)
Monocytes Relative: 7 %
Neutro Abs: 5.9 10*3/uL (ref 1.7–7.7)
Neutrophils Relative %: 69 %
Platelet Count: 357 10*3/uL (ref 150–400)
RBC: 4.48 MIL/uL (ref 3.87–5.11)
RDW: 13.2 % (ref 11.5–15.5)
WBC Count: 8.5 10*3/uL (ref 4.0–10.5)
nRBC: 0 % (ref 0.0–0.2)

## 2021-10-15 LAB — CMP (CANCER CENTER ONLY)
ALT: 13 U/L (ref 0–44)
AST: 16 U/L (ref 15–41)
Albumin: 3.9 g/dL (ref 3.5–5.0)
Alkaline Phosphatase: 56 U/L (ref 38–126)
Anion gap: 6 (ref 5–15)
BUN: 10 mg/dL (ref 8–23)
CO2: 27 mmol/L (ref 22–32)
Calcium: 9.2 mg/dL (ref 8.9–10.3)
Chloride: 105 mmol/L (ref 98–111)
Creatinine: 0.7 mg/dL (ref 0.44–1.00)
GFR, Estimated: >60 mL/min (ref >60)
Glucose, Bld: 141 mg/dL — ABNORMAL HIGH (ref 70–99)
Potassium: 4.3 mmol/L (ref 3.5–5.1)
Sodium: 138 mmol/L (ref 135–145)
Total Bilirubin: 0.4 mg/dL (ref 0.3–1.2)
Total Protein: 6 g/dL — ABNORMAL LOW (ref 6.5–8.1)

## 2021-10-15 MED ORDER — DARATUMUMAB-HYALURONIDASE-FIHJ 1800-30000 MG-UT/15ML ~~LOC~~ SOLN
1800.0000 mg | Freq: Once | SUBCUTANEOUS | Status: AC
  Administered 2021-10-15: 1800 mg via SUBCUTANEOUS
  Filled 2021-10-15: qty 15

## 2021-10-15 MED ORDER — DIPHENHYDRAMINE HCL 25 MG PO CAPS
50.0000 mg | ORAL_CAPSULE | Freq: Once | ORAL | Status: AC
  Administered 2021-10-15: 50 mg via ORAL
  Filled 2021-10-15: qty 2

## 2021-10-15 MED ORDER — ACETAMINOPHEN 325 MG PO TABS
650.0000 mg | ORAL_TABLET | Freq: Once | ORAL | Status: AC
  Administered 2021-10-15: 650 mg via ORAL
  Filled 2021-10-15: qty 2

## 2021-10-15 MED ORDER — DEXAMETHASONE 4 MG PO TABS
20.0000 mg | ORAL_TABLET | Freq: Once | ORAL | Status: AC
  Administered 2021-10-15: 20 mg via ORAL
  Filled 2021-10-15: qty 5

## 2021-10-15 NOTE — Progress Notes (Signed)
Call received from RN regarding letter patient received from [facility]. Advised to have patient call me directly to discuss details. ? ?Patient called to inquire about [place] renewal for copay assistance. Advised what would be needed to complete renewal. ? ?She states she will call me back when she gets home with access to that information. ? ?She has my contact name and number. ?

## 2021-10-15 NOTE — Patient Instructions (Signed)
[facility]  Discharge Instructions: Thank you for choosing [facility] to provide your oncology and hematology care.   If you have a lab appointment with the Cancer Center, please go directly to the Cancer Center and check in at the registration area.   Wear comfortable clothing and clothing appropriate for easy access to any Portacath or PICC line.   We strive to give you quality time with your provider. You may need to reschedule your appointment if you arrive late (15 or more minutes).  Arriving late affects you and other patients whose appointments are after yours.  Also, if you miss three or more appointments without notifying the office, you may be dismissed from the clinic at the provider's discretion.      For prescription refill requests, have your pharmacy contact our office and allow 72 hours for refills to be completed.    Today you received the following chemotherapy and/or immunotherapy agents: Darzalex Faspro      To help prevent nausea and vomiting after your treatment, we encourage you to take your nausea medication as directed.  BELOW ARE SYMPTOMS THAT SHOULD BE REPORTED IMMEDIATELY: *FEVER GREATER THAN 100.4 F (38 C) OR HIGHER *CHILLS OR SWEATING *NAUSEA AND VOMITING THAT IS NOT CONTROLLED WITH YOUR NAUSEA MEDICATION *UNUSUAL SHORTNESS OF BREATH *UNUSUAL BRUISING OR BLEEDING *URINARY PROBLEMS (pain or burning when urinating, or frequent urination) *BOWEL PROBLEMS (unusual diarrhea, constipation, pain near the anus) TENDERNESS IN MOUTH AND THROAT WITH OR WITHOUT PRESENCE OF ULCERS (sore throat, sores in mouth, or a toothache) UNUSUAL RASH, SWELLING OR PAIN  UNUSUAL VAGINAL DISCHARGE OR ITCHING   Items with * indicate a potential emergency and should be followed up as soon as possible or go to the Emergency Department if any problems should occur.  Please show the CHEMOTHERAPY ALERT CARD or IMMUNOTHERAPY ALERT CARD at  check-in to the Emergency Department and triage nurse.  Should you have questions after your visit or need to cancel or reschedule your appointment, please contact [facility]  Dept: [phone]  and follow the prompts.  Office hours are 8:00 a.m. to 4:30 p.m. Monday - Friday. Please note that voicemails left after 4:00 p.m. may not be returned until the following business day.  We are closed weekends and major holidays. You have access to a nurse at all times for urgent questions. Please call the main number to the clinic Dept: [phone] and follow the prompts.   For any non-urgent questions, you may also contact your provider using MyChart. We now offer e-Visits for anyone 18 and older to request care online for non-urgent symptoms. For details visit [email or web address].   Also download the MyChart app! Go to the app store, search "MyChart", open the app, select , and log in with your MyChart username and password.  Due to Covid, a mask is required upon entering the hospital/clinic. If you do not have a mask, one will be given to you upon arrival. For doctor visits, patients may have 1 support person aged 18 or older with them. For treatment visits, patients cannot have anyone with them due to current Covid guidelines and our immunocompromised population.  

## 2021-10-15 NOTE — Progress Notes (Signed)
Hematology and Oncology Follow Up Visit ? ?Nicole Keith ?[id number] ?04/16/1941 81 y.o. ?10/15/2021 9:10 AM ?[name], [name], [name], MD  ? ?Principle Diagnosis: 81 year old woman with multiple myeloma diagnosed in 2019 arising from MGUS.  She was found to have IgG lambda subtype. ? ?Past therapy:   ? ?Velcade and dexamethasone started weekly on 07/27/2018.  Therapy changed in February 2020. ? ?Revlimid 25 mg for 21 days out of a 28-day cycle with dexamethasone started in February 2020.  Therapy was held after 2 cycles because of severe dermatological toxicity. ? ?Pomalyst 1 mg daily for 21 days started on May 1 of 2020.  She completed 4 cycles of therapy. ? ? ?Ninlaro 4 mg weekly with dexamethasone 20 mg weekly for 3 weeks on and 1 week off started in December 2020.  Daratumumab was added on December 02, 2019.  Ninlaro discontinued in November 2021. ? ?Current therapy:  Daratumumab monthly starting May 18, 2020.  Therapy changed to every 3 months starting April 2022.  She is here for the subsequent treatment. ? ?Interim History:  Nicole Keith returns today for repeat evaluation.  Since last visit, she reports no major changes in her health.  She continues to tolerate the current therapy without any complaints.  She denies any nausea, vomiting or worsening skin rash.  She denies any hospitalizations or illnesses.  She denies any worsening bone pain pathological fractures.  She denies any constitutional symptoms. ? ? ? ? ? ? ?  ?Medications: Updated on review. ?Current Outpatient Medications  ?Medication Sig Dispense Refill  ? acetaminophen (TYLENOL) 500 MG tablet Take 500 mg by mouth in the morning, at noon, and at bedtime.     ? diphenhydramine-acetaminophen (TYLENOL PM) 25-500 MG TABS tablet Take 2 tablets by mouth at bedtime as needed (for sleep).     ? famotidine-calcium carbonate-magnesium hydroxide (PEPCID COMPLETE) 10-800-165 MG chewable tablet Chew 1 tablet by mouth daily as needed (for heartburn or  indigestion).     ? ibuprofen (ADVIL) 400 MG tablet Take 400 mg by mouth every 6 (six) hours.    ? magic mouthwash SOLN Take 5 mLs by mouth 3 (three) times daily as needed for mouth pain. 30 mL 0  ? Polyethyl Glycol-Propyl Glycol (SYSTANE) 0.4-0.3 % SOLN Place 1 drop into both eyes in the morning and at bedtime.     ? triamcinolone cream (KENALOG) 0.1 % Apply to the affected areas on the skin 2 times a day 454 g 3  ? ?No current facility-administered medications for this visit.  ? ? ? ?Allergies:  ?Allergies  ?Allergen Reactions  ? Pomalyst [Pomalidomide] Rash and Other (See Comments)  ?  ENTIRE BODY BROKE OUT IN A RASH  ? Revlimid [Lenalidomide] Rash and Other (See Comments)  ?  WHOLE BODY BLISTERED  ? Betadine [Povidone Iodine] Rash  ? Flagyl [Metronidazole] Diarrhea  ? Fosamax [Alendronate Sodium] Other (See Comments)  ?  GI distress  ? Bactrim [Sulfamethoxazole-Trimethoprim] Nausea Only  ? Clindamycin/Lincomycin   ?  Pt can't remember reaction type.  ? Doxepin Hcl Other (See Comments)  ?  Irritable, hallucinations  ? Hydrocodone-Acetaminophen   ?  Restlessness  ? Hydroxyzine Other (See Comments)  ?  Hallucinations  ? Hydroxyzine Hcl Other (See Comments)  ?  Hallucinations ?  ? Tramadol Hcl   ? Valium [Diazepam] Other (See Comments)  ?  Reaction not recalled  ? Actonel [Risedronate Sodium] Other (See Comments)  ?  GI distress  ? Boniva [Ibandronic Acid] Other (  See Comments)  ?  GI distress  ? Doxycycline Rash  ? Penicillins Rash  ?  Ancef was OK  ? ? ? ? ?Physical Exam: ? ? ? ? ?Blood pressure 140/76, pulse 74, temperature 97.8 ?F (36.6 ?C), temperature source Temporal, resp. rate 17, height _0  (1.676 m), weight 138 lb 9.6 oz (62.9 kg), SpO2 97 %. ? ? ? ? ? ?ECOG: 1 ? ? ?General appearance: Comfortable appearing without any discomfort ?Head: Normocephalic without any trauma ?Oropharynx: Mucous membranes are moist and pink without any thrush or ulcers. ?Eyes: Pupils are equal and round reactive to light. ?Lymph  nodes: No cervical, supraclavicular, inguinal or axillary lymphadenopathy.   ?Heart:regular rate and rhythm.  S1 and S2 without leg edema. ?Lung: Clear without any rhonchi or wheezes.  No dullness to percussion. ?Abdomin: Soft, nontender, nondistended with good bowel sounds.  No hepatosplenomegaly. ?Musculoskeletal: No joint deformity or effusion.  Full range of motion noted. ?Neurological: No deficits noted on motor, sensory and deep tendon reflex exam. ?Skin: No petechial rash or dryness.  Appeared moist.  ? ? ? ? ? ? ? ? ? ? ? ? ? ? ? ? ? ? ? ? ? ? ? ? ? ? ? ? ?Lab Results: ?Lab Results  ?Component Value Date  ? WBC 7.7 07/16/2021  ? HGB 13.9 07/16/2021  ? HCT 42.5 07/16/2021  ? MCV 93.4 07/16/2021  ? PLT 351 07/16/2021  ? ?  Chemistry   ?   ?Component Value Date/Time  ? NA 139 07/16/2021 0951  ? NA 135 (L) 06/11/2017 0753  ? K 4.0 07/16/2021 0951  ? K 4.0 06/11/2017 0753  ? CL 103 07/16/2021 0951  ? CO2 27 07/16/2021 0951  ? CO2 26 06/11/2017 0753  ? BUN 13 07/16/2021 0951  ? BUN 12.8 06/11/2017 0753  ? CREATININE 0.80 07/16/2021 0951  ? CREATININE 0.8 06/11/2017 0753  ?    ?Component Value Date/Time  ? CALCIUM 9.0 07/16/2021 0951  ? CALCIUM 8.8 06/11/2017 0753  ? ALKPHOS 61 07/16/2021 0951  ? ALKPHOS 72 06/11/2017 0753  ? AST 18 07/16/2021 0951  ? AST 21 06/11/2017 0753  ? ALT 13 07/16/2021 0951  ? ALT 13 06/11/2017 0753  ? BILITOT 0.5 07/16/2021 0951  ? BILITOT 0.35 06/11/2017 0753  ?  ? ? ? ? Latest Reference Range & Units 05/08/21 11:32 07/16/21 09:51  ?M Protein SerPl Elph-Mcnc Not Observed g/dL 0.2 (H) (C) 0.2 (H) (C)  ?IFE 1  Comment ! (C) Comment ! (C)  ?Globulin, Total 2.2 - 3.9 g/dL 2.1 (L) (C) 2.3 (C)  ?B-Globulin SerPl Elph-Mcnc 0.7 - 1.3 g/dL 0.8 (C) 0.9 (C)  ?IgG (Immunoglobin G), Serum 586 - 1,602 mg/dL 417 (L) 433 (L)  ?IgM (Immunoglobulin M), Srm 26 - 217 mg/dL 27 35  ?IgA 64 - 422 mg/dL 28 (L) 26 (L)  ?(H): Data is abnormally high ?!: Data is abnormal ?(L): Data is abnormally low ?(C):  Corrected ? ? ?  ?Impression and Plan: ? ?81 year old woman with: ? ?1.  Multiple myeloma diagnosed in 2019.  She was found to have IgG lambda subtype. ? ? ?The natural course of this disease was reviewed at this time and treatment regimens were discussed.  She continues to tolerate daratumumab maintenance without any major complications.  Risks and benefits of continuing this treatment were reiterated at this time.  Complications including infusion related issues as well as hemolysis.  Protein studies continues to show stable disease without any  further progression.  Different salvage therapy options including Kyprolis could be used in the future upon progression.  She is agreeable to proceed at this time. ? ?2. Osteoporosis and compression fracture: She has been on Zometa in the past and declined any further treatments. ? ? ? ?3.  Skin rash: Manageable at this time with topical steroids and overall improved. ? ?4. Follow-up: In 3 months for a follow-up visit. ? ?30  minutes were dedicated to this visit.  The time was spent on reviewing laboratory data, disease status update and outlining future plan of care. ? ?Zola Button, MD ?3/7/20239:10 AM ?

## 2021-10-15 NOTE — Progress Notes (Signed)
Patient called back to provide needed information. ? ?Attempted to apply for available copay assistance. [employer] grant funds are closed. Attempted to apply via [facility] and this fund has closed as well. ? ?No additional foundations have funds available at this time. ? ?Called patient back to advise and that I would apply on her behalf should any become available and have what is needed. She verbalized understanding. Advised of the one-time $500 [facility] assistance through [employer] to help offset expenses and she declined and wanted to wait for any copay assistance. ? ?She was very appreciative and has my contact name and number for any additional financial questions or concerns. ?

## 2021-10-16 LAB — KAPPA/LAMBDA LIGHT CHAINS
Kappa free light chain: 4.9 mg/L (ref 3.3–19.4)
Kappa, lambda light chain ratio: 1.2 (ref 0.26–1.65)
Lambda free light chains: 4.1 mg/L — ABNORMAL LOW (ref 5.7–26.3)

## 2021-10-22 LAB — MULTIPLE MYELOMA PANEL, SERUM
Albumin SerPl Elph-Mcnc: 3.3 g/dL (ref 2.9–4.4)
Albumin/Glob SerPl: 1.5 (ref 0.7–1.7)
Alpha 1: 0.3 g/dL (ref 0.0–0.4)
Alpha2 Glob SerPl Elph-Mcnc: 0.8 g/dL (ref 0.4–1.0)
B-Globulin SerPl Elph-Mcnc: 0.8 g/dL (ref 0.7–1.3)
Gamma Glob SerPl Elph-Mcnc: 0.4 g/dL (ref 0.4–1.8)
Globulin, Total: 2.3 g/dL (ref 2.2–3.9)
IgA: 24 mg/dL — ABNORMAL LOW (ref 64–422)
IgG (Immunoglobin G), Serum: 408 mg/dL — ABNORMAL LOW (ref 586–1602)
IgM (Immunoglobulin M), Srm: 26 mg/dL (ref 26–217)
M Protein SerPl Elph-Mcnc: 0.2 g/dL — ABNORMAL HIGH
Total Protein ELP: 5.6 g/dL — ABNORMAL LOW (ref 6.0–8.5)

## 2021-11-12 ENCOUNTER — Telehealth: Payer: Self-pay | Admitting: Oncology

## 2021-11-12 NOTE — Telephone Encounter (Signed)
Called patient regarding upcoming appointments, patient is notified. 

## 2022-01-02 ENCOUNTER — Telehealth: Payer: Self-pay | Admitting: Oncology

## 2022-01-02 NOTE — Telephone Encounter (Signed)
Patient has been called and voicemail was left regarding upcoming appointments.

## 2022-01-07 DIAGNOSIS — Z0000 Encounter for general adult medical examination without abnormal findings: Secondary | ICD-10-CM | POA: Diagnosis not present

## 2022-01-07 DIAGNOSIS — E785 Hyperlipidemia, unspecified: Secondary | ICD-10-CM | POA: Diagnosis not present

## 2022-01-07 DIAGNOSIS — R7303 Prediabetes: Secondary | ICD-10-CM | POA: Diagnosis not present

## 2022-01-07 DIAGNOSIS — C9000 Multiple myeloma not having achieved remission: Secondary | ICD-10-CM | POA: Diagnosis not present

## 2022-01-07 DIAGNOSIS — M810 Age-related osteoporosis without current pathological fracture: Secondary | ICD-10-CM | POA: Diagnosis not present

## 2022-01-07 DIAGNOSIS — E559 Vitamin D deficiency, unspecified: Secondary | ICD-10-CM | POA: Diagnosis not present

## 2022-01-07 DIAGNOSIS — I6529 Occlusion and stenosis of unspecified carotid artery: Secondary | ICD-10-CM | POA: Diagnosis not present

## 2022-01-15 ENCOUNTER — Encounter: Payer: Self-pay | Admitting: Oncology

## 2022-01-15 ENCOUNTER — Inpatient Hospital Stay: Payer: Medicare Other

## 2022-01-15 ENCOUNTER — Other Ambulatory Visit: Payer: Self-pay

## 2022-01-15 ENCOUNTER — Inpatient Hospital Stay: Payer: Medicare Other | Attending: Oncology

## 2022-01-15 ENCOUNTER — Inpatient Hospital Stay (HOSPITAL_BASED_OUTPATIENT_CLINIC_OR_DEPARTMENT_OTHER): Payer: Medicare Other | Admitting: Oncology

## 2022-01-15 VITALS — BP 133/85 | HR 74 | Temp 98.6°F | Resp 16 | Ht 66.0 in | Wt 134.8 lb

## 2022-01-15 DIAGNOSIS — C9000 Multiple myeloma not having achieved remission: Secondary | ICD-10-CM

## 2022-01-15 DIAGNOSIS — Z5112 Encounter for antineoplastic immunotherapy: Secondary | ICD-10-CM | POA: Diagnosis not present

## 2022-01-15 DIAGNOSIS — Z79899 Other long term (current) drug therapy: Secondary | ICD-10-CM | POA: Insufficient documentation

## 2022-01-15 LAB — CMP (CANCER CENTER ONLY)
ALT: 13 U/L (ref 0–44)
AST: 18 U/L (ref 15–41)
Albumin: 4.2 g/dL (ref 3.5–5.0)
Alkaline Phosphatase: 62 U/L (ref 38–126)
Anion gap: 8 (ref 5–15)
BUN: 11 mg/dL (ref 8–23)
CO2: 27 mmol/L (ref 22–32)
Calcium: 9.6 mg/dL (ref 8.9–10.3)
Chloride: 105 mmol/L (ref 98–111)
Creatinine: 0.8 mg/dL (ref 0.44–1.00)
GFR, Estimated: >60 mL/min (ref >60)
Glucose, Bld: 118 mg/dL — ABNORMAL HIGH (ref 70–99)
Potassium: 4.2 mmol/L (ref 3.5–5.1)
Sodium: 140 mmol/L (ref 135–145)
Total Bilirubin: 0.6 mg/dL (ref 0.3–1.2)
Total Protein: 6.4 g/dL — ABNORMAL LOW (ref 6.5–8.1)

## 2022-01-15 LAB — CBC WITH DIFFERENTIAL (CANCER CENTER ONLY)
Abs Immature Granulocytes: 0.01 10*3/uL (ref 0.00–0.07)
Basophils Absolute: 0.1 10*3/uL (ref 0.0–0.1)
Basophils Relative: 1 %
Eosinophils Absolute: 0.1 10*3/uL (ref 0.0–0.5)
Eosinophils Relative: 2 %
HCT: 43.2 % (ref 36.0–46.0)
Hemoglobin: 14.6 g/dL (ref 12.0–15.0)
Immature Granulocytes: 0 %
Lymphocytes Relative: 22 %
Lymphs Abs: 1.8 10*3/uL (ref 0.7–4.0)
MCH: 30.8 pg (ref 26.0–34.0)
MCHC: 33.8 g/dL (ref 30.0–36.0)
MCV: 91.1 fL (ref 80.0–100.0)
Monocytes Absolute: 0.5 10*3/uL (ref 0.1–1.0)
Monocytes Relative: 6 %
Neutro Abs: 5.4 10*3/uL (ref 1.7–7.7)
Neutrophils Relative %: 69 %
Platelet Count: 355 10*3/uL (ref 150–400)
RBC: 4.74 MIL/uL (ref 3.87–5.11)
RDW: 13.3 % (ref 11.5–15.5)
WBC Count: 7.8 10*3/uL (ref 4.0–10.5)
nRBC: 0 % (ref 0.0–0.2)

## 2022-01-15 MED ORDER — ACETAMINOPHEN 325 MG PO TABS
650.0000 mg | ORAL_TABLET | Freq: Once | ORAL | Status: AC
  Administered 2022-01-15: 650 mg via ORAL
  Filled 2022-01-15: qty 2

## 2022-01-15 MED ORDER — DEXAMETHASONE 4 MG PO TABS
20.0000 mg | ORAL_TABLET | Freq: Once | ORAL | Status: AC
  Administered 2022-01-15: 20 mg via ORAL
  Filled 2022-01-15: qty 5

## 2022-01-15 MED ORDER — DIPHENHYDRAMINE HCL 25 MG PO CAPS
50.0000 mg | ORAL_CAPSULE | Freq: Once | ORAL | Status: AC
  Administered 2022-01-15: 50 mg via ORAL
  Filled 2022-01-15: qty 2

## 2022-01-15 MED ORDER — DARATUMUMAB-HYALURONIDASE-FIHJ 1800-30000 MG-UT/15ML ~~LOC~~ SOLN
1800.0000 mg | Freq: Once | SUBCUTANEOUS | Status: AC
  Administered 2022-01-15: 1800 mg via SUBCUTANEOUS
  Filled 2022-01-15: qty 15

## 2022-01-15 NOTE — Patient Instructions (Signed)
[facility]  Discharge Instructions: Thank you for choosing [facility] to provide your oncology and hematology care.   If you have a lab appointment with the [facility], please go directly to the [facility] and check in at the registration area.   Wear comfortable clothing and clothing appropriate for easy access to any Portacath or PICC line.   We strive to give you quality time with your provider. You may need to reschedule your appointment if you arrive late (15 or more minutes).  Arriving late affects you and other patients whose appointments are after yours.  Also, if you miss three or more appointments without notifying the office, you may be dismissed from the clinic at the provider's discretion.      For prescription refill requests, have your pharmacy contact our office and allow 72 hours for refills to be completed.    Today you received the following chemotherapy and/or immunotherapy agents :  Darzelex Faspro       To help prevent nausea and vomiting after your treatment, we encourage you to take your nausea medication as directed.  BELOW ARE SYMPTOMS THAT SHOULD BE REPORTED IMMEDIATELY: *FEVER GREATER THAN 100.4 F (38 C) OR HIGHER *CHILLS OR SWEATING *NAUSEA AND VOMITING THAT IS NOT CONTROLLED WITH YOUR NAUSEA MEDICATION *UNUSUAL SHORTNESS OF BREATH *UNUSUAL BRUISING OR BLEEDING *URINARY PROBLEMS (pain or burning when urinating, or frequent urination) *BOWEL PROBLEMS (unusual diarrhea, constipation, pain near the anus) TENDERNESS IN MOUTH AND THROAT WITH OR WITHOUT PRESENCE OF ULCERS (sore throat, sores in mouth, or a toothache) UNUSUAL RASH, SWELLING OR PAIN  UNUSUAL VAGINAL DISCHARGE OR ITCHING   Items with * indicate a potential emergency and should be followed up as soon as possible or go to the Emergency Department if any problems should occur.  Please show the CHEMOTHERAPY ALERT CARD or IMMUNOTHERAPY ALERT CARD at  check-in to the Emergency Department and triage nurse.  Should you have questions after your visit or need to cancel or reschedule your appointment, please contact [facility]  Dept: [phone]  and follow the prompts.  Office hours are 8:00 a.m. to 4:30 p.m. Monday - Friday. Please note that voicemails left after 4:00 p.m. may not be returned until the following business day.  We are closed weekends and major holidays. You have access to a nurse at all times for urgent questions. Please call the main number to the clinic Dept: [phone] and follow the prompts.   For any non-urgent questions, you may also contact your provider using MyChart. We now offer e-Visits for anyone 11 and older to request care online for non-urgent symptoms. For details visit [email or web address].   Also download the MyChart app! Go to the app store, search "MyChart", open the app, select [facility], and log in with your MyChart username and password.  Due to Covid, a mask is required upon entering the hospital/clinic. If you do not have a mask, one will be given to you upon arrival. For doctor visits, patients may have 1 support person aged 9 or older with them. For treatment visits, patients cannot have anyone with them due to current Covid guidelines and our immunocompromised population.

## 2022-01-15 NOTE — Progress Notes (Signed)
Patient called when she got home to discuss copay assistance.  Advised I was able to enroll her in [employer] assistance program and of the approval. She was very appreciative.  She has my card for any additional financial questions or concerns.

## 2022-01-15 NOTE — Progress Notes (Signed)
Hematology and Oncology Follow Up Visit  Nicole Keith [id number] [birth year] 81 y.o. 01/15/2022 9:06 AM Nicole Keith, [name], [name], MD   Principle Diagnosis: 81 year old woman with IgG lambda multiple myeloma diagnosed in 2019 arising from MGUS.  =  Past therapy:    Velcade and dexamethasone started weekly on 07/27/2018.  Therapy changed in February 2020.  Revlimid 25 mg for 21 days out of a 28-day cycle with dexamethasone started in February 2020.  Therapy was held after 2 cycles because of severe dermatological toxicity.  Pomalyst 1 mg daily for 21 days started on May 1 of 2020.  She completed 4 cycles of therapy.   Ninlaro 4 mg weekly with dexamethasone 20 mg weekly for 3 weeks on and 1 week off started in December 2020.  Daratumumab was added on December 02, 2019.  Ninlaro discontinued in November 2021.  Current therapy:  Daratumumab monthly starting May 18, 2020.  Therapy changed to every 3 months starting April 2022.  She is here for the next cycle of therapy.  Interim History:  Nicole Keith is here for a follow-up visit.  Since last visit, she reports no major complaints at this time.  She denies any abdominal pain, dyspepsia or complications related to daratumumab treatment.  Her performance status remains at baseline without any decline.  Appetite and energy remain stable.         Medications: Reviewed without changes. Current Outpatient Medications  Medication Sig Dispense Refill   acetaminophen (TYLENOL) 500 MG tablet Take 500 mg by mouth in the morning, at noon, and at bedtime.      diphenhydramine-acetaminophen (TYLENOL PM) 25-500 MG TABS tablet Take 2 tablets by mouth at bedtime as needed (for sleep).      famotidine-calcium carbonate-magnesium hydroxide (PEPCID COMPLETE) 10-800-165 MG chewable tablet Chew 1 tablet by mouth daily as needed (for heartburn or indigestion).      ibuprofen (ADVIL) 400 MG tablet Take 400 mg by mouth every 6 (six) hours.     magic  mouthwash SOLN Take 5 mLs by mouth 3 (three) times daily as needed for mouth pain. 30 mL 0   Polyethyl Glycol-Propyl Glycol (SYSTANE) 0.4-0.3 % SOLN Place 1 drop into both eyes in the morning and at bedtime.      triamcinolone cream (KENALOG) 0.1 % Apply to the affected areas on the skin 2 times a day 454 g 3   No current facility-administered medications for this visit.     Allergies:  Allergies  Allergen Reactions   Pomalyst [Pomalidomide] Rash and Other (See Comments)    ENTIRE BODY BROKE OUT IN A RASH   Revlimid [Lenalidomide] Rash and Other (See Comments)    WHOLE BODY BLISTERED   Betadine [Povidone Iodine] Rash   Flagyl [Metronidazole] Diarrhea   Fosamax [Alendronate Sodium] Other (See Comments)    GI distress   Bactrim [Sulfamethoxazole-Trimethoprim] Nausea Only   Clindamycin/Lincomycin     Pt can't remember reaction type.   Doxepin Hcl Other (See Comments)    Irritable, hallucinations   Hydrocodone-Acetaminophen     Restlessness   Hydroxyzine Other (See Comments)    Hallucinations   Hydroxyzine Hcl Other (See Comments)    Hallucinations    Tramadol Hcl    Valium [Diazepam] Other (See Comments)    Reaction not recalled   Actonel [Risedronate Sodium] Other (See Comments)    GI distress   Boniva [Ibandronic Acid] Other (See Comments)    GI distress   Doxycycline Rash   Penicillins Rash  Ancef was OK      Physical Exam:   Blood pressure 133/85, pulse 74, temperature 98.6 F (37 C), temperature source Temporal, resp. rate 16, height '5\' 6"'  (1.676 m), weight 134 lb 12.8 oz (61.1 kg), SpO2 98 %.         ECOG: 1    General appearance: Alert, awake without any distress. Head: Atraumatic without abnormalities Oropharynx: Without any thrush or ulcers. Eyes: No scleral icterus. Lymph nodes: No lymphadenopathy noted in the cervical, supraclavicular, or axillary nodes Heart:regular rate and rhythm, without any murmurs or gallops.   Lung: Clear to  auscultation without any rhonchi, wheezes or dullness to percussion. Abdomin: Soft, nontender without any shifting dullness or ascites. Musculoskeletal: No clubbing or cyanosis. Neurological: No motor or sensory deficits. Skin: No rashes or lesions.                              Lab Results: Lab Results  Component Value Date   WBC 8.5 10/15/2021   HGB 13.7 10/15/2021   HCT 41.5 10/15/2021   MCV 92.6 10/15/2021   PLT 357 10/15/2021     Chemistry      Component Value Date/Time   NA 138 10/15/2021 0926   NA 135 (L) 06/11/2017 0753   K 4.3 10/15/2021 0926   K 4.0 06/11/2017 0753   CL 105 10/15/2021 0926   CO2 27 10/15/2021 0926   CO2 26 06/11/2017 0753   BUN 10 10/15/2021 0926   BUN 12.8 06/11/2017 0753   CREATININE 0.70 10/15/2021 0926   CREATININE 0.8 06/11/2017 0753      Component Value Date/Time   CALCIUM 9.2 10/15/2021 0926   CALCIUM 8.8 06/11/2017 0753   ALKPHOS 56 10/15/2021 0926   ALKPHOS 72 06/11/2017 0753   AST 16 10/15/2021 0926   AST 21 06/11/2017 0753   ALT 13 10/15/2021 0926   ALT 13 06/11/2017 0753   BILITOT 0.4 10/15/2021 0926   BILITOT 0.35 06/11/2017 0753        Latest Reference Range & Units 05/08/21 11:32 07/16/21 09:51 10/15/21 09:27  M Protein SerPl Elph-Mcnc Not Observed g/dL 0.2 (H) (C) 0.2 (H) (C) 0.2 (H) (C)  IFE 1  Comment ! (C) Comment ! (C) Comment ! (C)  Globulin, Total 2.2 - 3.9 g/dL 2.1 (L) (C) 2.3 (C) 2.3 (C)  B-Globulin SerPl Elph-Mcnc 0.7 - 1.3 g/dL 0.8 (C) 0.9 (C) 0.8 (C)  IgG (Immunoglobin G), Serum 586 - 1,602 mg/dL 417 (L) 433 (L) 408 (L)  IgM (Immunoglobulin M), Srm 26 - 217 mg/dL 27 35 26  IgA 64 - 422 mg/dL 28 (L) 26 (L) 24 (L)  (H): Data is abnormally high !: Data is abnormal (L): Data is abnormally low (C): Corrected    Impression and Plan:  81 year old woman with:  1.  IgG lambda multiple myeloma diagnosed in 2019.     She continues to be on maintenance therapy with daratumumab.   Risks and benefits of continuing this treatment versus restarting salvage therapy were discussed.  She continues to have no evidence of endorgan damage with normal hematological parameters as well as kidney function and electrolytes.  Her M spike remains very low at 0.2 g/dL.  Salvage therapy options will be initiated if he has relapsed disease.   2. Osteoporosis and compression fracture: She has received Zometa in the past and declined any additional therapy.    3.  Hyperlipidemia: She is managed by  her primary care physician with increased cholesterol.  I have no objections to statin therapy.  4. Follow-up: She will return in 3 months for a follow-up visit.  30  minutes were spent on this encounter.  The time was dedicated to reviewing laboratory data, disease status update and outlining future plan of care discussion.  Zola Button, MD 6/7/20239:06 AM

## 2022-01-15 NOTE — Progress Notes (Signed)
Received message from RN regarding patient with copay concerns of expired grant.  Advised RN to have patient contact me once she gets home and settled to discuss.  In the meanwhile, reviewed treatment plan for available copay assistance. Was able to secure grant via [facility] in the amount of $12,000 for copay assistance effective 12/16/21- 12/16/22 which 5/8 is the lookback period(30 days). Copay information shared with Lenise for billing/claim submissions.  Will provide information to patient when she calls. She may also receive a letter from [facility] for her records only.  She has my card for any additional financial questions or concerns.

## 2022-01-16 LAB — KAPPA/LAMBDA LIGHT CHAINS
Kappa free light chain: 4.7 mg/L (ref 3.3–19.4)
Kappa, lambda light chain ratio: 0.58 (ref 0.26–1.65)
Lambda free light chains: 8.1 mg/L (ref 5.7–26.3)

## 2022-01-20 LAB — MULTIPLE MYELOMA PANEL, SERUM
Albumin SerPl Elph-Mcnc: 3.7 g/dL (ref 2.9–4.4)
Albumin/Glob SerPl: 1.5 (ref 0.7–1.7)
Alpha 1: 0.3 g/dL (ref 0.0–0.4)
Alpha2 Glob SerPl Elph-Mcnc: 0.8 g/dL (ref 0.4–1.0)
B-Globulin SerPl Elph-Mcnc: 0.9 g/dL (ref 0.7–1.3)
Gamma Glob SerPl Elph-Mcnc: 0.5 g/dL (ref 0.4–1.8)
Globulin, Total: 2.5 g/dL (ref 2.2–3.9)
IgA: 25 mg/dL — ABNORMAL LOW (ref 64–422)
IgG (Immunoglobin G), Serum: 492 mg/dL — ABNORMAL LOW (ref 586–1602)
IgM (Immunoglobulin M), Srm: 30 mg/dL (ref 26–217)
M Protein SerPl Elph-Mcnc: 0.3 g/dL — ABNORMAL HIGH
Total Protein ELP: 6.2 g/dL (ref 6.0–8.5)

## 2022-02-19 ENCOUNTER — Telehealth: Payer: Self-pay | Admitting: Oncology

## 2022-02-19 NOTE — Telephone Encounter (Signed)
Called patient regarding upcoming September appointments, patient has been called and voicemail was left. 

## 2022-03-03 ENCOUNTER — Other Ambulatory Visit: Payer: Self-pay

## 2022-03-11 ENCOUNTER — Other Ambulatory Visit: Payer: Self-pay

## 2022-03-12 DIAGNOSIS — D1801 Hemangioma of skin and subcutaneous tissue: Secondary | ICD-10-CM | POA: Diagnosis not present

## 2022-03-12 DIAGNOSIS — Z85828 Personal history of other malignant neoplasm of skin: Secondary | ICD-10-CM | POA: Diagnosis not present

## 2022-03-12 DIAGNOSIS — L309 Dermatitis, unspecified: Secondary | ICD-10-CM | POA: Diagnosis not present

## 2022-03-12 DIAGNOSIS — L821 Other seborrheic keratosis: Secondary | ICD-10-CM | POA: Diagnosis not present

## 2022-03-12 DIAGNOSIS — L820 Inflamed seborrheic keratosis: Secondary | ICD-10-CM | POA: Diagnosis not present

## 2022-03-12 DIAGNOSIS — L249 Irritant contact dermatitis, unspecified cause: Secondary | ICD-10-CM | POA: Diagnosis not present

## 2022-03-12 DIAGNOSIS — L308 Other specified dermatitis: Secondary | ICD-10-CM | POA: Diagnosis not present

## 2022-03-12 DIAGNOSIS — D225 Melanocytic nevi of trunk: Secondary | ICD-10-CM | POA: Diagnosis not present

## 2022-03-12 DIAGNOSIS — L570 Actinic keratosis: Secondary | ICD-10-CM | POA: Diagnosis not present

## 2022-03-22 ENCOUNTER — Other Ambulatory Visit: Payer: Self-pay | Admitting: Oncology

## 2022-03-22 DIAGNOSIS — C9000 Multiple myeloma not having achieved remission: Secondary | ICD-10-CM

## 2022-04-11 ENCOUNTER — Other Ambulatory Visit: Payer: Self-pay

## 2022-04-16 ENCOUNTER — Inpatient Hospital Stay: Payer: Medicare Other | Admitting: Oncology

## 2022-04-16 ENCOUNTER — Other Ambulatory Visit: Payer: Self-pay

## 2022-04-16 ENCOUNTER — Inpatient Hospital Stay: Payer: Medicare Other | Attending: Oncology

## 2022-04-16 ENCOUNTER — Inpatient Hospital Stay: Payer: Medicare Other

## 2022-04-16 VITALS — BP 144/77 | HR 78 | Temp 97.7°F | Resp 15 | Ht 66.0 in | Wt 134.3 lb

## 2022-04-16 DIAGNOSIS — Z5112 Encounter for antineoplastic immunotherapy: Secondary | ICD-10-CM | POA: Insufficient documentation

## 2022-04-16 DIAGNOSIS — C9000 Multiple myeloma not having achieved remission: Secondary | ICD-10-CM | POA: Insufficient documentation

## 2022-04-16 DIAGNOSIS — Z79899 Other long term (current) drug therapy: Secondary | ICD-10-CM | POA: Insufficient documentation

## 2022-04-16 LAB — CBC WITH DIFFERENTIAL (CANCER CENTER ONLY)
Abs Immature Granulocytes: 0.02 10*3/uL (ref 0.00–0.07)
Basophils Absolute: 0.1 10*3/uL (ref 0.0–0.1)
Basophils Relative: 1 %
Eosinophils Absolute: 0.3 10*3/uL (ref 0.0–0.5)
Eosinophils Relative: 4 %
HCT: 41.7 % (ref 36.0–46.0)
Hemoglobin: 14.1 g/dL (ref 12.0–15.0)
Immature Granulocytes: 0 %
Lymphocytes Relative: 24 %
Lymphs Abs: 1.9 10*3/uL (ref 0.7–4.0)
MCH: 31.3 pg (ref 26.0–34.0)
MCHC: 33.8 g/dL (ref 30.0–36.0)
MCV: 92.5 fL (ref 80.0–100.0)
Monocytes Absolute: 0.6 10*3/uL (ref 0.1–1.0)
Monocytes Relative: 7 %
Neutro Abs: 5.2 10*3/uL (ref 1.7–7.7)
Neutrophils Relative %: 64 %
Platelet Count: 296 10*3/uL (ref 150–400)
RBC: 4.51 MIL/uL (ref 3.87–5.11)
RDW: 13.2 % (ref 11.5–15.5)
WBC Count: 8.1 10*3/uL (ref 4.0–10.5)
nRBC: 0 % (ref 0.0–0.2)

## 2022-04-16 LAB — CMP (CANCER CENTER ONLY)
ALT: 13 U/L (ref 0–44)
AST: 17 U/L (ref 15–41)
Albumin: 4 g/dL (ref 3.5–5.0)
Alkaline Phosphatase: 65 U/L (ref 38–126)
Anion gap: 6 (ref 5–15)
BUN: 12 mg/dL (ref 8–23)
CO2: 29 mmol/L (ref 22–32)
Calcium: 9.3 mg/dL (ref 8.9–10.3)
Chloride: 105 mmol/L (ref 98–111)
Creatinine: 0.78 mg/dL (ref 0.44–1.00)
GFR, Estimated: >60 mL/min (ref >60)
Glucose, Bld: 105 mg/dL — ABNORMAL HIGH (ref 70–99)
Potassium: 4.2 mmol/L (ref 3.5–5.1)
Sodium: 140 mmol/L (ref 135–145)
Total Bilirubin: 0.6 mg/dL (ref 0.3–1.2)
Total Protein: 6 g/dL — ABNORMAL LOW (ref 6.5–8.1)

## 2022-04-16 MED ORDER — DIPHENHYDRAMINE HCL 25 MG PO CAPS
50.0000 mg | ORAL_CAPSULE | Freq: Once | ORAL | Status: AC
  Administered 2022-04-16: 50 mg via ORAL
  Filled 2022-04-16: qty 2

## 2022-04-16 MED ORDER — DEXAMETHASONE 4 MG PO TABS
20.0000 mg | ORAL_TABLET | Freq: Once | ORAL | Status: AC
  Administered 2022-04-16: 20 mg via ORAL
  Filled 2022-04-16: qty 5

## 2022-04-16 MED ORDER — ACETAMINOPHEN 325 MG PO TABS
650.0000 mg | ORAL_TABLET | Freq: Once | ORAL | Status: AC
  Administered 2022-04-16: 650 mg via ORAL
  Filled 2022-04-16: qty 2

## 2022-04-16 MED ORDER — DARATUMUMAB-HYALURONIDASE-FIHJ 1800-30000 MG-UT/15ML ~~LOC~~ SOLN
1800.0000 mg | Freq: Once | SUBCUTANEOUS | Status: AC
  Administered 2022-04-16: 1800 mg via SUBCUTANEOUS
  Filled 2022-04-16: qty 15

## 2022-04-16 NOTE — Progress Notes (Signed)
Hematology and Oncology Follow Up Visit  DESYRE CALMA [id number] 05/12/1941 81 y.o. 04/16/2022 9:23 AM [name], [name], [name], MD   Principle Diagnosis: 81 year old woman with multiple myeloma arising from MGUS diagnosed in 2019.  She was found to have IgG lambda subtype.  Past therapy:    Velcade and dexamethasone started weekly on 07/27/2018.  Therapy changed in February 2020.  Revlimid 25 mg for 21 days out of a 28-day cycle with dexamethasone started in February 2020.  Therapy was held after 2 cycles because of severe dermatological toxicity.  Pomalyst 1 mg daily for 21 days started on May 1 of 2020.  She completed 4 cycles of therapy.   Ninlaro 4 mg weekly with dexamethasone 20 mg weekly for 3 weeks on and 1 week off started in December 2020.  Daratumumab was added on December 02, 2019.  Ninlaro discontinued in November 2021.  Current therapy:  Daratumumab monthly starting May 18, 2020.  Therapy changed to every 3 months starting April 2022.  She is here for the next cycle of therapy.  Interim History:  Mrs. Returns today for repeat evaluation.  Since last visit, she reports feeling well without any complaints.  She denies any nausea vomiting or abdominal pain.  She denies any bone pain or pathological fractures.  She denies any hospitalizations or illnesses.  Her performance status and quality of life remains unchanged.  [place] is reasonably active [place]         Medications: Updated on review. Current Outpatient Medications  Medication Sig Dispense Refill   acetaminophen (TYLENOL) 500 MG tablet Take 500 mg by mouth in the morning, at noon, and at bedtime.      diphenhydramine-acetaminophen (TYLENOL PM) 25-500 MG TABS tablet Take 2 tablets by mouth at bedtime as needed (for sleep).      famotidine-calcium carbonate-magnesium hydroxide (PEPCID COMPLETE) 10-800-165 MG chewable tablet Chew 1 tablet by mouth daily as needed (for heartburn or  indigestion).      ibuprofen (ADVIL) 400 MG tablet Take 400 mg by mouth every 6 (six) hours.     magic mouthwash SOLN Take 5 mLs by mouth 3 (three) times daily as needed for mouth pain. 30 mL 0   Polyethyl Glycol-Propyl Glycol (SYSTANE) 0.4-0.3 % SOLN Place 1 drop into both eyes in the morning and at bedtime.      No current facility-administered medications for this visit.     Allergies:  Allergies  Allergen Reactions   Pomalyst [Pomalidomide] Rash and Other (See Comments)    ENTIRE BODY BROKE OUT IN A RASH   Revlimid [Lenalidomide] Rash and Other (See Comments)    WHOLE BODY BLISTERED   Betadine [Povidone Iodine] Rash   Flagyl [Metronidazole] Diarrhea   Fosamax [Alendronate Sodium] Other (See Comments)    GI distress   Bactrim [Sulfamethoxazole-Trimethoprim] Nausea Only   Clindamycin/Lincomycin     Pt can't remember reaction type.   Doxepin Hcl Other (See Comments)    Irritable, hallucinations   Hydrocodone-Acetaminophen     Restlessness   Hydroxyzine Other (See Comments)    Hallucinations   Hydroxyzine Hcl Other (See Comments)    Hallucinations    Tramadol Hcl    Valium [Diazepam] Other (See Comments)    Reaction not recalled   Actonel [Risedronate Sodium] Other (See Comments)    GI distress   Boniva [Ibandronic Acid] Other (See Comments)    GI distress   Doxycycline Rash   Penicillins Rash    Ancef  was OK      Physical Exam:       Blood pressure (!) 144/77, pulse 78, temperature 97.7 F (36.5 C), temperature source Temporal, resp. rate 15, height '5\' 6"'  (1.676 m), weight 134 lb 4.8 oz (60.9 kg), SpO2 95 %.      ECOG: 1   General appearance: Comfortable appearing without any discomfort Head: Normocephalic without any trauma Oropharynx: Mucous membranes are moist and pink without any thrush or ulcers. Eyes: Pupils are equal and round reactive to light. Lymph nodes: No cervical, supraclavicular, inguinal or axillary lymphadenopathy.   Heart:regular  rate and rhythm.  S1 and S2 without leg edema. Lung: Clear without any rhonchi or wheezes.  No dullness to percussion. Abdomin: Soft, nontender, nondistended with good bowel sounds.  No hepatosplenomegaly. Musculoskeletal: No joint deformity or effusion.  Full range of motion noted. Neurological: No deficits noted on motor, sensory and deep tendon reflex exam. Skin: No petechial rash or dryness.  Appeared moist.                               Lab Results: Lab Results  Component Value Date   WBC 7.8 01/15/2022   HGB 14.6 01/15/2022   HCT 43.2 01/15/2022   MCV 91.1 01/15/2022   PLT 355 01/15/2022     Chemistry      Component Value Date/Time   NA 140 01/15/2022 0848   NA 135 (L) 06/11/2017 0753   K 4.2 01/15/2022 0848   K 4.0 06/11/2017 0753   CL 105 01/15/2022 0848   CO2 27 01/15/2022 0848   CO2 26 06/11/2017 0753   BUN 11 01/15/2022 0848   BUN 12.8 06/11/2017 0753   CREATININE 0.80 01/15/2022 0848   CREATININE 0.8 06/11/2017 0753      Component Value Date/Time   CALCIUM 9.6 01/15/2022 0848   CALCIUM 8.8 06/11/2017 0753   ALKPHOS 62 01/15/2022 0848   ALKPHOS 72 06/11/2017 0753   AST 18 01/15/2022 0848   AST 21 06/11/2017 0753   ALT 13 01/15/2022 0848   ALT 13 06/11/2017 0753   BILITOT 0.6 01/15/2022 0848   BILITOT 0.35 06/11/2017 0753       Latest Reference Range & Units 05/08/21 11:32 07/16/21 09:51 10/15/21 09:27 01/15/22 09:07  M Protein SerPl Elph-Mcnc Not Observed g/dL 0.2 (H) (C) 0.2 (H) (C) 0.2 (H) (C) 0.3 (H) (C)  IFE 1  Comment ! (C) Comment ! (C) Comment ! (C) Comment ! (C)  Globulin, Total 2.2 - 3.9 g/dL 2.1 (L) (C) 2.3 (C) 2.3 (C) 2.5 (C)  B-Globulin SerPl Elph-Mcnc 0.7 - 1.3 g/dL 0.8 (C) 0.9 (C) 0.8 (C) 0.9 (C)  IgG (Immunoglobin G), Serum 586 - 1,602 mg/dL 417 (L) 433 (L) 408 (L) 492 (L)  IgM (Immunoglobulin M), Srm 26 - 217 mg/dL 27 35 26 30  IgA 64 - 422 mg/dL 28 (L) 26 (L) 24 (L) 25 (L)  (H): Data is abnormally high !:  Data is abnormal (L): Data is abnormally low (C): Corrected     Latest Reference Range & Units 07/16/21 09:51 10/15/21 09:26 01/15/22 08:48  Kappa free light chain 3.3 - 19.4 mg/L 4.5 4.9 4.7  Lambda free light chains 5.7 - 26.3 mg/L 3.8 (L) 4.1 (L) 8.1  Kappa, lambda light chain ratio 0.26 - 1.65  1.18 1.20 0.58  (L): Data is abnormally low Impression and Plan:  81 year old woman with:  1.  Multiple myeloma  diagnosed in 2019.  He was found to have IgG lambda arising from MGUS.  He continues to be on daratumumab maintenance every 3 months with the protein studies relatively stable.  In June 2023 her kappa to lambda ratio is normalized with very small M spike at 0.3 g/dL.  Risks and benefits of continuing this treatment long-term were discussed.  Potential complications include infusion related issues among others were reiterated.  Alternative salvage therapy options were also reviewed if she develops relapsed disease.   2. Osteoporosis and compression fracture: She is off the Zometa at this time we will continue to monitor.  She has refused restarting it.     3. Follow-up: In 3 months for repeat evaluation.  30  minutes were dedicated to this visit.  The time was spent on reviewing laboratory data, disease status update and addressing complication related to cancer and cancer therapy.  Zola Button, MD 9/6/20239:23 AM

## 2022-04-16 NOTE — Patient Instructions (Signed)
[facility]COLOG[facility]   Discharge Instructions: Thank you for choosing [facility] to provide your oncology and hematology care.   If you have a lab appointment with the Cancer Center, please go directly to the Cancer Center and check in at the registration area.   Wear comfortable clothing and clothing appropriate for easy access to any Portacath or PICC line.   We strive to give you quality time with your provider. You may need to reschedule your appointment if you arrive late (15 or more minutes).  Arriving late affects you and other patients whose appointments are after yours.  Also, if you miss three or more appointments without notifying the office, you may be dismissed from the clinic at the provider's discretion.      For prescription refill requests, have your pharmacy contact our office and allow 72 hours for refills to be completed.    Today you received the following chemotherapy and/or immunotherapy agents: daratumumab-hyaluronidase-fihj      To help prevent nausea and vomiting after your treatment, we encourage you to take your nausea medication as directed.  BELOW ARE SYMPTOMS THAT SHOULD BE REPORTED IMMEDIATELY: *FEVER GREATER THAN 100.4 F (38 C) OR HIGHER *CHILLS OR SWEATING *NAUSEA AND VOMITING THAT IS NOT CONTROLLED WITH YOUR NAUSEA MEDICATION *UNUSUAL SHORTNESS OF BREATH *UNUSUAL BRUISING OR BLEEDING *URINARY PROBLEMS (pain or burning when urinating, or frequent urination) *BOWEL PROBLEMS (unusual diarrhea, constipation, pain near the anus) TENDERNESS IN MOUTH AND THROAT WITH OR WITHOUT PRESENCE OF ULCERS (sore throat, sores in mouth, or a toothache) UNUSUAL RASH, SWELLING OR PAIN  UNUSUAL VAGINAL DISCHARGE OR ITCHING   Items with * indicate a potential emergency and should be followed up as soon as possible or go to the Emergency Department if any problems should occur.  Please show the CHEMOTHERAPY ALERT CARD or IMMUNOTHERAPY  ALERT CARD at check-in to the Emergency Department and triage nurse.  Should you have questions after your visit or need to cancel or reschedule your appointment, please contact [facility]  Dept: [phone]  and follow the prompts.  Office hours are 8:00 a.m. to 4:30 p.m. Monday - Friday. Please note that voicemails left after 4:00 p.m. may not be returned until the following business day.  We are closed weekends and major holidays. You have access to a nurse at all times for urgent questions. Please call the main number to the clinic Dept: [phone] and follow the prompts.   For any non-urgent questions, you may also contact your provider using MyChart. We now offer e-Visits for anyone 18 and older to request care online for non-urgent symptoms. For details visit [email or web address].   Also download the MyChart app! Go to the app store, search "MyChart", open the app, select [facility], and log in with your MyChart username and password.  Masks are optional in the cancer centers. If you would like for your care team to wear a mask while they are taking care of you, please let them know. You may have one support person who is at least 82 years old accompany you for your appointments. 

## 2022-04-17 LAB — KAPPA/LAMBDA LIGHT CHAINS
Kappa free light chain: 3.3 mg/L (ref 3.3–19.4)
Kappa, lambda light chain ratio: 0.27 (ref 0.26–1.65)
Lambda free light chains: 12.2 mg/L (ref 5.7–26.3)

## 2022-04-21 ENCOUNTER — Other Ambulatory Visit: Payer: Self-pay

## 2022-04-22 ENCOUNTER — Other Ambulatory Visit: Payer: Self-pay

## 2022-04-22 LAB — MULTIPLE MYELOMA PANEL, SERUM
Albumin SerPl Elph-Mcnc: 3.5 g/dL (ref 2.9–4.4)
Albumin/Glob SerPl: 1.6 (ref 0.7–1.7)
Alpha 1: 0.2 g/dL (ref 0.0–0.4)
Alpha2 Glob SerPl Elph-Mcnc: 0.7 g/dL (ref 0.4–1.0)
B-Globulin SerPl Elph-Mcnc: 0.8 g/dL (ref 0.7–1.3)
Gamma Glob SerPl Elph-Mcnc: 0.6 g/dL (ref 0.4–1.8)
Globulin, Total: 2.3 g/dL (ref 2.2–3.9)
IgA: 25 mg/dL — ABNORMAL LOW (ref 64–422)
IgG (Immunoglobin G), Serum: 621 mg/dL (ref 586–1602)
IgM (Immunoglobulin M), Srm: 26 mg/dL (ref 26–217)
M Protein SerPl Elph-Mcnc: 0.4 g/dL — ABNORMAL HIGH
Total Protein ELP: 5.8 g/dL — ABNORMAL LOW (ref 6.0–8.5)

## 2022-04-23 ENCOUNTER — Other Ambulatory Visit: Payer: Self-pay

## 2022-04-27 ENCOUNTER — Other Ambulatory Visit: Payer: Self-pay

## 2022-06-16 DIAGNOSIS — L821 Other seborrheic keratosis: Secondary | ICD-10-CM | POA: Diagnosis not present

## 2022-06-16 DIAGNOSIS — Z85828 Personal history of other malignant neoplasm of skin: Secondary | ICD-10-CM | POA: Diagnosis not present

## 2022-06-16 DIAGNOSIS — L245 Irritant contact dermatitis due to other chemical products: Secondary | ICD-10-CM | POA: Diagnosis not present

## 2022-06-16 DIAGNOSIS — L218 Other seborrheic dermatitis: Secondary | ICD-10-CM | POA: Diagnosis not present

## 2022-06-16 DIAGNOSIS — L308 Other specified dermatitis: Secondary | ICD-10-CM | POA: Diagnosis not present

## 2022-06-16 DIAGNOSIS — L570 Actinic keratosis: Secondary | ICD-10-CM | POA: Diagnosis not present

## 2022-07-01 ENCOUNTER — Telehealth: Payer: Self-pay | Admitting: Oncology

## 2022-07-01 NOTE — Telephone Encounter (Signed)
Called patient regarding upcoming December appointment, patient is notified. 

## 2022-07-16 ENCOUNTER — Other Ambulatory Visit: Payer: Self-pay

## 2022-07-16 ENCOUNTER — Inpatient Hospital Stay: Payer: Medicare Other

## 2022-07-16 ENCOUNTER — Inpatient Hospital Stay: Payer: Medicare Other | Attending: Oncology

## 2022-07-16 ENCOUNTER — Other Ambulatory Visit: Payer: Self-pay | Admitting: Oncology

## 2022-07-16 ENCOUNTER — Inpatient Hospital Stay (HOSPITAL_BASED_OUTPATIENT_CLINIC_OR_DEPARTMENT_OTHER): Payer: Medicare Other | Admitting: Oncology

## 2022-07-16 VITALS — BP 130/76 | HR 72 | Temp 97.9°F | Resp 17 | Ht 66.0 in | Wt 132.7 lb

## 2022-07-16 DIAGNOSIS — Z5112 Encounter for antineoplastic immunotherapy: Secondary | ICD-10-CM | POA: Diagnosis not present

## 2022-07-16 DIAGNOSIS — C9000 Multiple myeloma not having achieved remission: Secondary | ICD-10-CM | POA: Insufficient documentation

## 2022-07-16 DIAGNOSIS — E785 Hyperlipidemia, unspecified: Secondary | ICD-10-CM

## 2022-07-16 DIAGNOSIS — Z79899 Other long term (current) drug therapy: Secondary | ICD-10-CM | POA: Insufficient documentation

## 2022-07-16 LAB — CBC WITH DIFFERENTIAL (CANCER CENTER ONLY)
Abs Immature Granulocytes: 0.01 10*3/uL (ref 0.00–0.07)
Basophils Absolute: 0 10*3/uL (ref 0.0–0.1)
Basophils Relative: 1 %
Eosinophils Absolute: 0.1 10*3/uL (ref 0.0–0.5)
Eosinophils Relative: 2 %
HCT: 40.9 % (ref 36.0–46.0)
Hemoglobin: 13.5 g/dL (ref 12.0–15.0)
Immature Granulocytes: 0 %
Lymphocytes Relative: 22 %
Lymphs Abs: 1.8 10*3/uL (ref 0.7–4.0)
MCH: 30.8 pg (ref 26.0–34.0)
MCHC: 33 g/dL (ref 30.0–36.0)
MCV: 93.4 fL (ref 80.0–100.0)
Monocytes Absolute: 0.6 10*3/uL (ref 0.1–1.0)
Monocytes Relative: 7 %
Neutro Abs: 5.4 10*3/uL (ref 1.7–7.7)
Neutrophils Relative %: 68 %
Platelet Count: 325 10*3/uL (ref 150–400)
RBC: 4.38 MIL/uL (ref 3.87–5.11)
RDW: 13.6 % (ref 11.5–15.5)
WBC Count: 7.9 10*3/uL (ref 4.0–10.5)
nRBC: 0 % (ref 0.0–0.2)

## 2022-07-16 LAB — CMP (CANCER CENTER ONLY)
ALT: 15 U/L (ref 0–44)
AST: 20 U/L (ref 15–41)
Albumin: 3.9 g/dL (ref 3.5–5.0)
Alkaline Phosphatase: 60 U/L (ref 38–126)
Anion gap: 6 (ref 5–15)
BUN: 13 mg/dL (ref 8–23)
CO2: 29 mmol/L (ref 22–32)
Calcium: 9.2 mg/dL (ref 8.9–10.3)
Chloride: 104 mmol/L (ref 98–111)
Creatinine: 0.77 mg/dL (ref 0.44–1.00)
GFR, Estimated: >60 mL/min (ref >60)
Glucose, Bld: 111 mg/dL — ABNORMAL HIGH (ref 70–99)
Potassium: 4.2 mmol/L (ref 3.5–5.1)
Sodium: 139 mmol/L (ref 135–145)
Total Bilirubin: 0.5 mg/dL (ref 0.3–1.2)
Total Protein: 6.5 g/dL (ref 6.5–8.1)

## 2022-07-16 LAB — LIPID PANEL
Cholesterol: 200 mg/dL (ref 0–200)
HDL: 42 mg/dL (ref >40)
LDL Cholesterol: 141 mg/dL — ABNORMAL HIGH (ref 0–99)
Total CHOL/HDL Ratio: 4.8 RATIO
Triglycerides: 84 mg/dL (ref <150)
VLDL: 17 mg/dL (ref 0–40)

## 2022-07-16 MED ORDER — DEXAMETHASONE 4 MG PO TABS
20.0000 mg | ORAL_TABLET | Freq: Once | ORAL | Status: AC
  Administered 2022-07-16: 20 mg via ORAL
  Filled 2022-07-16: qty 5

## 2022-07-16 MED ORDER — DIPHENHYDRAMINE HCL 25 MG PO CAPS
50.0000 mg | ORAL_CAPSULE | Freq: Once | ORAL | Status: AC
  Administered 2022-07-16: 50 mg via ORAL
  Filled 2022-07-16: qty 2

## 2022-07-16 MED ORDER — DARATUMUMAB-HYALURONIDASE-FIHJ 1800-30000 MG-UT/15ML ~~LOC~~ SOLN
1800.0000 mg | Freq: Once | SUBCUTANEOUS | Status: AC
  Administered 2022-07-16: 1800 mg via SUBCUTANEOUS
  Filled 2022-07-16: qty 15

## 2022-07-16 MED ORDER — ACETAMINOPHEN 325 MG PO TABS
650.0000 mg | ORAL_TABLET | Freq: Once | ORAL | Status: AC
  Administered 2022-07-16: 650 mg via ORAL
  Filled 2022-07-16: qty 2

## 2022-07-16 NOTE — Progress Notes (Signed)
Hematology and Oncology Follow Up Visit  Nicole Keith [id number] 12-Sep-1940 81 y.o. 07/16/2022 9:08 AM Nicole Keith, MDShadad, Mathis Dad, MD   Principle Diagnosis: 89 year old woman with IgG lambda multiple myeloma arising from MGUS diagnosed in 2019.  She had developed M spike up to 5.9 g/dL, IgG level of 8977 and worsening anemia.   Past therapy:    Velcade and dexamethasone started weekly on 07/27/2018.  Therapy changed in February 2020 due to poor response.  Revlimid 25 mg for 21 days out of a 28-day cycle with dexamethasone started in February 2020.  Therapy was held after 2 cycles because of severe dermatological toxicity.  Pomalyst 1 mg daily for 21 days started on May 1 of 2020.  She completed 4 cycles of therapy and therapy was held due to occurrence of dermatological toxicity.   Ninlaro 4 mg weekly with dexamethasone 20 mg weekly for 3 weeks on and 1 week off started in December 2020.  Daratumumab was added on December 02, 2019.  Ninlaro discontinued in November 2021.  Daratumumab monthly starting May 18, 2020 for maintenance purposes until April 2022.  She achieved a near complete response.   Current therapy:    Daratumumab 1800 mg subcutaneously every 3 months starting April 2022.  She returns for the next cycle of therapy.  Interim History:  Nicole Keith is here for a follow-up visit.  Since the last visit, she reports feeling well without any major complaints.  She denies any nausea, vomiting or abdominal pain.  She denies any hospitalizations or illnesses.  She denies any bone pain or pathological fractures.  Her mobility is limited but still able to ambulate with the help of a cane.  Her performance status quality of life remains unchanged.        Medications: reviewed without changes. Current Outpatient Medications  Medication Sig Dispense Refill   acetaminophen (TYLENOL) 500 MG tablet Take 500 mg by mouth in the morning, at noon, and at bedtime.       diphenhydramine-acetaminophen (TYLENOL PM) 25-500 MG TABS tablet Take 2 tablets by mouth at bedtime as needed (for sleep).      famotidine-calcium carbonate-magnesium hydroxide (PEPCID COMPLETE) 10-800-165 MG chewable tablet Chew 1 tablet by mouth daily as needed (for heartburn or indigestion).      ibuprofen (ADVIL) 400 MG tablet Take 400 mg by mouth every 6 (six) hours.     magic mouthwash SOLN Take 5 mLs by mouth 3 (three) times daily as needed for mouth pain. 30 mL 0   Polyethyl Glycol-Propyl Glycol (SYSTANE) 0.4-0.3 % SOLN Place 1 drop into both eyes in the morning and at bedtime.      No current facility-administered medications for this visit.     Allergies:  Allergies  Allergen Reactions   Pomalyst [Pomalidomide] Rash and Other (See Comments)    ENTIRE BODY BROKE OUT IN A RASH   Revlimid [Lenalidomide] Rash and Other (See Comments)    WHOLE BODY BLISTERED   Betadine [Povidone Iodine] Rash   Flagyl [Metronidazole] Diarrhea   Fosamax [Alendronate Sodium] Other (See Comments)    GI distress   Bactrim [Sulfamethoxazole-Trimethoprim] Nausea Only   Clindamycin/Lincomycin     Pt can't remember reaction type.   Doxepin Hcl Other (See Comments)    Irritable, hallucinations   Hydrocodone-Acetaminophen     Restlessness   Hydroxyzine Other (See Comments)    Hallucinations   Hydroxyzine Hcl Other (See Comments)    Hallucinations    Tramadol Hcl  Valium [Diazepam] Other (See Comments)    Reaction not recalled   Actonel [Risedronate Sodium] Other (See Comments)    GI distress   Boniva [Ibandronic Acid] Other (See Comments)    GI distress   Doxycycline Rash   Penicillins Rash    Ancef was OK      Physical Exam:      Blood pressure 130/76, pulse 72, temperature 97.9 F (36.6 C), temperature source Temporal, resp. rate 17, height _0  (1.676 m), weight 132 lb 11.2 oz (60.2 kg), SpO2 100 %.        ECOG: 1     General appearance: Alert, awake without any  distress. Head: Atraumatic without abnormalities Oropharynx: Without any thrush or ulcers. Eyes: No scleral icterus. Lymph nodes: No lymphadenopathy noted in the cervical, supraclavicular, or axillary nodes Heart:regular rate and rhythm, without any murmurs or gallops.   Lung: Clear to auscultation without any rhonchi, wheezes or dullness to percussion. Abdomin: Soft, nontender without any shifting dullness or ascites. Musculoskeletal: No clubbing or cyanosis. Neurological: No motor or sensory deficits. Skin: No rashes or lesions.                              Lab Results: Lab Results  Component Value Date   WBC 8.1 04/16/2022   HGB 14.1 04/16/2022   HCT 41.7 04/16/2022   MCV 92.5 04/16/2022   PLT 296 04/16/2022     Chemistry      Component Value Date/Time   NA 140 04/16/2022 0935   NA 135 (L) 06/11/2017 0753   K 4.2 04/16/2022 0935   K 4.0 06/11/2017 0753   CL 105 04/16/2022 0935   CO2 29 04/16/2022 0935   CO2 26 06/11/2017 0753   BUN 12 04/16/2022 0935   BUN 12.8 06/11/2017 0753   CREATININE 0.78 04/16/2022 0935   CREATININE 0.8 06/11/2017 0753      Component Value Date/Time   CALCIUM 9.3 04/16/2022 0935   CALCIUM 8.8 06/11/2017 0753   ALKPHOS 65 04/16/2022 0935   ALKPHOS 72 06/11/2017 0753   AST 17 04/16/2022 0935   AST 21 06/11/2017 0753   ALT 13 04/16/2022 0935   ALT 13 06/11/2017 0753   BILITOT 0.6 04/16/2022 0935   BILITOT 0.35 06/11/2017 0753            81 year old woman with:  1.  IgG lambda multiple myeloma diagnosed in 2019.  She initially presented with MGUS and subsequently developed symptomatic anemia and multiple myeloma.  She is currently on daratumumab maintenance after achieving near complete response.  Protein studies from September 2023 showed normal kappa and lambda levels with normalization of her IgG level.  She still has a residual M spike of 0.4 g/dL although no endorgan damage.  The natural course of  this disease and the need for salvage therapy with relapsed disease were discussed.  She had multiple drug allergies in the past and poor tolerance to many regimens and currently doing reasonably well with daratumumab maintenance.  I recommended continuing daratumumab maintenance and use of this therapy option if she symptomatically progressed.    Restaging her with a bone marrow biopsy and skeletal imaging would be required at this time.  Given her stability at this time we will defer that option.  She understands that she might need does if her protein studies starts to rise.   Laboratory data from today reviewed and showed a normal CBC at this  time including hemoglobin of 13.5.   2. Osteoporosis and compression fracture: She has received Zometa in the past and has declined any further treatment.     3. Follow-up: She will return in 3 months for repeat evaluation and next cycle starting tomorrow.  30  minutes were spent on this encounter.  The time was dedicated to updating her disease status, treatment choices and outlining future plan of care review.  Zola Button, MD 12/6/20239:08 AM

## 2022-07-16 NOTE — Patient Instructions (Signed)
[facility] ONCOLOG[facility]   Discharge Instructions: Thank you for choosing [facility] to provide your oncology and hematology care.   If you have a lab appointment with the [facility], please go directly to the [facility] and check in at the registration area.   Wear comfortable clothing and clothing appropriate for easy access to any Portacath or PICC line.   We strive to give you quality time with your provider. You may need to reschedule your appointment if you arrive late (15 or more minutes).  Arriving late affects you and other patients whose appointments are after yours.  Also, if you miss three or more appointments without notifying the office, you may be dismissed from the clinic at the provider's discretion.      For prescription refill requests, have your pharmacy contact our office and allow 72 hours for refills to be completed.    Today you received the following chemotherapy and/or immunotherapy agents: daratumumab-hyaluronidase-fihj      To help prevent nausea and vomiting after your treatment, we encourage you to take your nausea medication as directed.  BELOW ARE SYMPTOMS THAT SHOULD BE REPORTED IMMEDIATELY: *FEVER GREATER THAN 100.4 F (38 C) OR HIGHER *CHILLS OR SWEATING *NAUSEA AND VOMITING THAT IS NOT CONTROLLED WITH YOUR NAUSEA MEDICATION *UNUSUAL SHORTNESS OF BREATH *UNUSUAL BRUISING OR BLEEDING *URINARY PROBLEMS (pain or burning when urinating, or frequent urination) *BOWEL PROBLEMS (unusual diarrhea, constipation, pain near the anus) TENDERNESS IN MOUTH AND THROAT WITH OR WITHOUT PRESENCE OF ULCERS (sore throat, sores in mouth, or a toothache) UNUSUAL RASH, SWELLING OR PAIN  UNUSUAL VAGINAL DISCHARGE OR ITCHING   Items with * indicate a potential emergency and should be followed up as soon as possible or go to the Emergency Department if any problems should occur.  Please show the CHEMOTHERAPY ALERT CARD or IMMUNOTHERAPY  ALERT CARD at check-in to the Emergency Department and triage nurse.  Should you have questions after your visit or need to cancel or reschedule your appointment, please contact [facility]  Dept: [phone]  and follow the prompts.  Office hours are 8:00 a.m. to 4:30 p.m. Monday - Friday. Please note that voicemails left after 4:00 p.m. may not be returned until the following business day.  We are closed weekends and major holidays. You have access to a nurse at all times for urgent questions. Please call the main number to the clinic Dept: [phone] and follow the prompts.   For any non-urgent questions, you may also contact your provider using MyChart. We now offer e-Visits for anyone 44 and older to request care online for non-urgent symptoms. For details visit [email or web address].   Also download the MyChart app! Go to the app store, search "MyChart", open the app, select [facility], and log in with your MyChart username and password.  Masks are optional in the cancer centers. If you would like for your care team to wear a mask while they are taking care of you, please let them know. You may have one support person who is at least 82 years old accompany you for your appointments.

## 2022-07-16 NOTE — Addendum Note (Signed)
Addended by: Wyatt Portela on: 07/16/2022 10:21 AM   Modules accepted: Orders

## 2022-07-17 LAB — KAPPA/LAMBDA LIGHT CHAINS
Kappa free light chain: 3.9 mg/L (ref 3.3–19.4)
Kappa, lambda light chain ratio: 0.2 — ABNORMAL LOW (ref 0.26–1.65)
Lambda free light chains: 19.7 mg/L (ref 5.7–26.3)

## 2022-07-18 ENCOUNTER — Other Ambulatory Visit: Payer: Self-pay

## 2022-07-20 ENCOUNTER — Other Ambulatory Visit: Payer: Self-pay

## 2022-07-21 LAB — MULTIPLE MYELOMA PANEL, SERUM
Albumin SerPl Elph-Mcnc: 3.4 g/dL (ref 2.9–4.4)
Albumin/Glob SerPl: 1.3 (ref 0.7–1.7)
Alpha 1: 0.3 g/dL (ref 0.0–0.4)
Alpha2 Glob SerPl Elph-Mcnc: 0.7 g/dL (ref 0.4–1.0)
B-Globulin SerPl Elph-Mcnc: 0.8 g/dL (ref 0.7–1.3)
Gamma Glob SerPl Elph-Mcnc: 0.9 g/dL (ref 0.4–1.8)
Globulin, Total: 2.7 g/dL (ref 2.2–3.9)
IgA: 23 mg/dL — ABNORMAL LOW (ref 64–422)
IgG (Immunoglobin G), Serum: 986 mg/dL (ref 586–1602)
IgM (Immunoglobulin M), Srm: 34 mg/dL (ref 26–217)
M Protein SerPl Elph-Mcnc: 0.7 g/dL — ABNORMAL HIGH
Total Protein ELP: 6.1 g/dL (ref 6.0–8.5)

## 2022-07-25 ENCOUNTER — Other Ambulatory Visit: Payer: Self-pay

## 2022-08-19 ENCOUNTER — Other Ambulatory Visit: Payer: Self-pay

## 2022-08-19 DIAGNOSIS — L309 Dermatitis, unspecified: Secondary | ICD-10-CM | POA: Diagnosis not present

## 2022-08-19 DIAGNOSIS — C9000 Multiple myeloma not having achieved remission: Secondary | ICD-10-CM | POA: Diagnosis not present

## 2022-08-19 DIAGNOSIS — I6529 Occlusion and stenosis of unspecified carotid artery: Secondary | ICD-10-CM | POA: Diagnosis not present

## 2022-08-19 DIAGNOSIS — E785 Hyperlipidemia, unspecified: Secondary | ICD-10-CM | POA: Diagnosis not present

## 2022-08-25 ENCOUNTER — Telehealth: Payer: Self-pay

## 2022-08-25 NOTE — Telephone Encounter (Signed)
T/C from pt with an FYI.  She had to go to the Dr today for an infected jaw tooth and was prescribed an antibiotic-levofloxacin. On 11/08/22 she is scheduled to have the tooth extracted.

## 2022-08-29 ENCOUNTER — Other Ambulatory Visit: Payer: Self-pay

## 2022-08-31 ENCOUNTER — Other Ambulatory Visit: Payer: Self-pay

## 2022-09-08 ENCOUNTER — Telehealth: Payer: Self-pay | Admitting: Physician Assistant

## 2022-09-08 NOTE — Telephone Encounter (Signed)
Patient returned call to confirm new appointment times. Patient notified.

## 2022-09-08 NOTE — Telephone Encounter (Signed)
Called patient per 1/29 secure chat for provider scheduling conflict. Left voicemail with new appointment information.

## 2022-09-10 ENCOUNTER — Other Ambulatory Visit: Payer: Self-pay

## 2022-10-14 ENCOUNTER — Other Ambulatory Visit: Payer: Self-pay | Admitting: Physician Assistant

## 2022-10-14 DIAGNOSIS — C9000 Multiple myeloma not having achieved remission: Secondary | ICD-10-CM

## 2022-10-15 ENCOUNTER — Other Ambulatory Visit (HOSPITAL_COMMUNITY): Payer: Self-pay

## 2022-10-15 ENCOUNTER — Inpatient Hospital Stay (HOSPITAL_BASED_OUTPATIENT_CLINIC_OR_DEPARTMENT_OTHER): Payer: Medicare Other | Admitting: Physician Assistant

## 2022-10-15 ENCOUNTER — Other Ambulatory Visit: Payer: Self-pay

## 2022-10-15 ENCOUNTER — Ambulatory Visit: Payer: Medicare Other

## 2022-10-15 ENCOUNTER — Other Ambulatory Visit: Payer: Medicare Other

## 2022-10-15 ENCOUNTER — Inpatient Hospital Stay: Payer: Medicare Other | Attending: Oncology

## 2022-10-15 ENCOUNTER — Ambulatory Visit: Payer: Medicare Other | Admitting: Physician Assistant

## 2022-10-15 ENCOUNTER — Inpatient Hospital Stay: Payer: Medicare Other

## 2022-10-15 ENCOUNTER — Ambulatory Visit: Payer: Medicare Other | Admitting: Hematology and Oncology

## 2022-10-15 ENCOUNTER — Other Ambulatory Visit: Payer: Self-pay | Admitting: Hematology

## 2022-10-15 VITALS — BP 137/72 | HR 71 | Temp 97.7°F | Resp 16 | Wt 132.0 lb

## 2022-10-15 DIAGNOSIS — R21 Rash and other nonspecific skin eruption: Secondary | ICD-10-CM | POA: Insufficient documentation

## 2022-10-15 DIAGNOSIS — C9000 Multiple myeloma not having achieved remission: Secondary | ICD-10-CM | POA: Diagnosis not present

## 2022-10-15 DIAGNOSIS — Z800 Family history of malignant neoplasm of digestive organs: Secondary | ICD-10-CM | POA: Diagnosis not present

## 2022-10-15 DIAGNOSIS — Z5112 Encounter for antineoplastic immunotherapy: Secondary | ICD-10-CM | POA: Diagnosis not present

## 2022-10-15 DIAGNOSIS — Z79899 Other long term (current) drug therapy: Secondary | ICD-10-CM | POA: Diagnosis not present

## 2022-10-15 LAB — CBC WITH DIFFERENTIAL (CANCER CENTER ONLY)
Abs Immature Granulocytes: 0.02 10*3/uL (ref 0.00–0.07)
Basophils Absolute: 0 10*3/uL (ref 0.0–0.1)
Basophils Relative: 1 %
Eosinophils Absolute: 0.1 10*3/uL (ref 0.0–0.5)
Eosinophils Relative: 2 %
HCT: 42 % (ref 36.0–46.0)
Hemoglobin: 14.3 g/dL (ref 12.0–15.0)
Immature Granulocytes: 0 %
Lymphocytes Relative: 22 %
Lymphs Abs: 1.9 10*3/uL (ref 0.7–4.0)
MCH: 31.8 pg (ref 26.0–34.0)
MCHC: 34 g/dL (ref 30.0–36.0)
MCV: 93.5 fL (ref 80.0–100.0)
Monocytes Absolute: 0.7 10*3/uL (ref 0.1–1.0)
Monocytes Relative: 8 %
Neutro Abs: 5.8 10*3/uL (ref 1.7–7.7)
Neutrophils Relative %: 67 %
Platelet Count: 340 10*3/uL (ref 150–400)
RBC: 4.49 MIL/uL (ref 3.87–5.11)
RDW: 13.6 % (ref 11.5–15.5)
WBC Count: 8.5 10*3/uL (ref 4.0–10.5)
nRBC: 0 % (ref 0.0–0.2)

## 2022-10-15 LAB — CMP (CANCER CENTER ONLY)
ALT: 19 U/L (ref 0–44)
AST: 22 U/L (ref 15–41)
Albumin: 4.1 g/dL (ref 3.5–5.0)
Alkaline Phosphatase: 58 U/L (ref 38–126)
Anion gap: 5 (ref 5–15)
BUN: 12 mg/dL (ref 8–23)
CO2: 31 mmol/L (ref 22–32)
Calcium: 9.1 mg/dL (ref 8.9–10.3)
Chloride: 102 mmol/L (ref 98–111)
Creatinine: 0.78 mg/dL (ref 0.44–1.00)
GFR, Estimated: >60 mL/min (ref >60)
Glucose, Bld: 103 mg/dL — ABNORMAL HIGH (ref 70–99)
Potassium: 4.3 mmol/L (ref 3.5–5.1)
Sodium: 138 mmol/L (ref 135–145)
Total Bilirubin: 0.5 mg/dL (ref 0.3–1.2)
Total Protein: 7.1 g/dL (ref 6.5–8.1)

## 2022-10-15 MED ORDER — ACETAMINOPHEN 325 MG PO TABS
650.0000 mg | ORAL_TABLET | Freq: Once | ORAL | Status: AC
  Administered 2022-10-15: 650 mg via ORAL
  Filled 2022-10-15: qty 2

## 2022-10-15 MED ORDER — DEXAMETHASONE 4 MG PO TABS
20.0000 mg | ORAL_TABLET | Freq: Once | ORAL | Status: AC
  Administered 2022-10-15: 20 mg via ORAL
  Filled 2022-10-15: qty 5

## 2022-10-15 MED ORDER — DIPHENHYDRAMINE HCL 25 MG PO CAPS
50.0000 mg | ORAL_CAPSULE | Freq: Once | ORAL | Status: AC
  Administered 2022-10-15: 50 mg via ORAL
  Filled 2022-10-15: qty 2

## 2022-10-15 MED ORDER — DARATUMUMAB-HYALURONIDASE-FIHJ 1800-30000 MG-UT/15ML ~~LOC~~ SOLN
1800.0000 mg | Freq: Once | SUBCUTANEOUS | Status: AC
  Administered 2022-10-15: 1800 mg via SUBCUTANEOUS
  Filled 2022-10-15: qty 15

## 2022-10-15 NOTE — Progress Notes (Signed)
[facility] [phone]   [phone]  PROGRESS NOTE  Patient Care Team: Farris Has, MD as PCP - General (Family Medicine)  CHIEF COMPLAINTS/PURPOSE OF CONSULTATION:  IgG Lambda Multiple Myeloma  ONCOLOGIC HISTORY: 07/27/2018-09/2018: Received weekly velcade plus dexamethasone. Therapy changed due to poor response.  09/2018: Started Revlimid 25 mg for 21 days on, 7 days off of a 28 day cycle with dexamethasone. Therapy was held after 2 cycles because of severe dermatological toxicity 12/2018: Pomalyst 1 mg daily for 21 days.  She completed 4 cycles of therapy and therapy was held due to occurrence of dermatological toxicity.  07/2019: Started Ninlaro 4 mg weekly with dexamethasone 20 mg weekly for 3 weeks on and 1 week off. Daratumumab was addd in April 2021. Clearnce Sorrel was discontinued in November 2021. 05/18/2020: Started monthly daratumumab and transitioned to q 3 month schedule in April 2022.   HISTORY OF PRESENTING ILLNESS:  Nicole Keith 82 y.o. female returns for a follow up for IgG lambda multiple myeloma. She was last seen by Dr. Clelia Croft on 07/16/2022. She presents today for her next daratumumab treatment. She is unaccompanied for this visit.   On exam today, Nicole Keith reports she is tolerating her treatment without any significant limitations. She enjoys the schedule of coming once every 3 months [place]. She denies any changes to her appetite or weight. She has no GI symptoms including nausea, vomiting, diarrhea or constipation. She denies easy bruising or signs of bleeding. She suffers from a chronic, diffuse itchy rash that is managed with topical kenalog cream. She recently stopped her statin medication in hopes of improvement of the rash. She adds having pain in the right rib area for the last 3-4 months. She denies fevers, chills, sweats, shortness of breath, chest pain or cough. She has no other  complaints. Rest of the 10 point ROS is below;[name] Kitchen   MEDICAL HISTORY:  Past Medical History:  Diagnosis Date   Allergic rhinitis    Arthritis    Colitis, collagenous    Diverticular disease    severe sig tics/fixation 2012   Family hx of colon cancer    Fracture of femoral neck, right (HCC) 01/09/2013   GERD (gastroesophageal reflux disease)    exacerbation of reflux-like symptoms 04/2011   Hiatal hernia    Hyperlipidemia    Osteoarthritis of right hip 01/10/2013    SURGICAL HISTORY: Past Surgical History:  Procedure Laterality Date   CHOLECYSTECTOMY     EYE SURGERY  feb 2016   cataract right eye   LEG SURGERY Left    TONSILLECTOMY     TOTAL HIP ARTHROPLASTY Right 01/10/2013   Procedure: TOTAL HIP ARTHROPLASTY;  Surgeon: Eulas Post, MD;  Location: [facility] OR;  Service: Orthopedics;  Laterality: Right;    SOCIAL HISTORY: Social History   Socioeconomic History   Marital status: Married    Spouse name: Not on file   Number of children: 0   Years of education: Not on file   Highest education level: Some college, no degree  Occupational History   Occupation: retired  Tobacco Use   Smoking status: Former    Types: Cigarettes    Quit date: 01/10/1972    Years since quitting: 50.8   Smokeless tobacco: Never   Tobacco comments:    quit 1973  Vaping Use   Vaping Use: Never used  Substance and Sexual Activity   Alcohol use: No   Drug use:  No   Sexual activity: Not on file  Other Topics Concern   Not on file  Social History Narrative   Married, no children. Drinks 2 cups decaf coffee a day. Walks daily. Is active, plays golf, she and her husband go hiking frequently. 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   Social Determinants of Health   Financial Resource Strain: Not on file  Food Insecurity: Not on file  Transportation Needs: Not on file  Physical Activity: Not on file  Stress: Not on file  Social Connections: Not on file  Intimate Partner Violence:  Not on file    FAMILY HISTORY: Family History  Problem Relation Age of Onset   Congestive Heart Failure Mother    Arthritis-Osteo Mother    Colon cancer Mother    Heart attack Father    Rheum arthritis Father    Colon cancer Maternal Aunt    Colon cancer Maternal Aunt    Hypothyroidism Sister     ALLERGIES:  is allergic to pomalyst [pomalidomide], revlimid [lenalidomide], betadine [povidone iodine], flagyl [metronidazole], fosamax [alendronate sodium], bactrim [sulfamethoxazole-trimethoprim], clindamycin/lincomycin, doxepin hcl, hydrocodone-acetaminophen, hydroxyzine, hydroxyzine hcl, tramadol hcl, valium [diazepam], actonel [risedronate sodium], boniva [ibandronic acid], doxycycline, and penicillins.  MEDICATIONS:  Current Outpatient Medications  Medication Sig Dispense Refill   acetaminophen (TYLENOL) 500 MG tablet Take 500 mg by mouth in the morning, at noon, and at bedtime.      diphenhydramine-acetaminophen (TYLENOL PM) 25-500 MG TABS tablet Take 2 tablets by mouth at bedtime as needed (for sleep).      famotidine-calcium carbonate-magnesium hydroxide (PEPCID COMPLETE) 10-800-165 MG chewable tablet Chew 1 tablet by mouth daily as needed (for heartburn or indigestion).      ibuprofen (ADVIL) 400 MG tablet Take 400 mg by mouth every 6 (six) hours.     nystatin (MYCOSTATIN/NYSTOP) powder Apply 1 Application topically 3 (three) times daily. 15 g 0   triamcinolone cream (KENALOG) 0.1 % Apply 1 Application topically 2 (two) times daily. 30 g 0   magic mouthwash SOLN Take 5 mLs by mouth 3 (three) times daily as needed for mouth pain. (Patient not taking: Reported on 07/16/2022) 30 mL 0   Polyethyl Glycol-Propyl Glycol (SYSTANE) 0.4-0.3 % SOLN Place 1 drop into both eyes in the morning and at bedtime.  (Patient not taking: Reported on 07/16/2022)     pravastatin (PRAVACHOL) 10 MG tablet Take 10 mg by mouth daily. (Patient not taking: Reported on 10/15/2022)     No current  facility-administered medications for this visit.    REVIEW OF SYSTEMS:   Constitutional: ( - ) fevers, ( - )  chills , ( - ) night sweats Eyes: ( - ) blurriness of vision, ( - ) double vision, ( - ) watery eyes Ears, nose, mouth, throat, and face: ( - ) mucositis, ( - ) sore throat Respiratory: ( - ) cough, ( - ) dyspnea, ( - ) wheezes Cardiovascular: ( - ) palpitation, ( - ) chest discomfort, ( - ) lower extremity swelling Gastrointestinal:  ( - ) nausea, ( - ) heartburn, ( - ) change in bowel habits Skin: ( +) abnormal skin rashes Lymphatics: ( - ) new lymphadenopathy, ( - ) easy bruising Neurological: ( - ) numbness, ( - ) tingling, ( - ) new weaknesses Behavioral/Psych: ( - ) mood change, ( - ) new changes  All other systems were reviewed with the patient and are negative.  PHYSICAL EXAMINATION: ECOG PERFORMANCE STATUS: 1 - Symptomatic but completely  ambulatory  Vitals:   10/15/22 1114  BP: 137/72  Pulse: 71  Resp: 16  Temp: 97.7 F (36.5 C)  SpO2: 96%   Filed Weights   10/15/22 1114  Weight: 132 lb (59.9 kg)    GENERAL: well appearing female in NAD  SKIN: skin color, texture, turgor are normal. Diffuse, flat erythematous rash.  EYES: conjunctiva are pink and non-injected, sclera clear LYMPH:  no palpable lymphadenopathy in the cervical or supraclavicular lymph nodes.  LUNGS: clear to auscultation and percussion with normal breathing effort HEART: regular rate & rhythm and no murmurs and no lower extremity edema Musculoskeletal: no cyanosis of digits and no clubbing  PSYCH: alert & oriented x 3, fluent speech NEURO: no focal motor/sensory deficits  LABORATORY DATA:  I have reviewed the data as listed    Latest Ref Rng & Units 10/15/2022   10:43 AM 07/16/2022    8:48 AM 04/16/2022    9:35 AM  CBC  WBC 4.0 - 10.5 K/uL 8.5  7.9  8.1   Hemoglobin 12.0 - 15.0 g/dL 16.1  09.6  04.5   Hematocrit 36.0 - 46.0 % 42.0  40.9  41.7   Platelets 150 - 400 K/uL 340  325  296         Latest Ref Rng & Units 10/15/2022   10:43 AM 07/16/2022    8:48 AM 04/16/2022    9:35 AM  CMP  Glucose 70 - 99 mg/dL 409  811  914   BUN 8 - 23 mg/dL 12  13  12    Creatinine 0.44 - 1.00 mg/dL 7.82  9.56  2.13   Sodium 135 - 145 mmol/L 138  139  140   Potassium 3.5 - 5.1 mmol/L 4.3  4.2  4.2   Chloride 98 - 111 mmol/L 102  104  105   CO2 22 - 32 mmol/L 31  29  29    Calcium 8.9 - 10.3 mg/dL 9.1  9.2  9.3   Total Protein 6.5 - 8.1 g/dL 7.1  6.5  6.0   Total Bilirubin 0.3 - 1.2 mg/dL 0.5  0.5  0.6   Alkaline Phos 38 - 126 U/L 58  60  65   AST 15 - 41 U/L 22  20  17    ALT 0 - 44 U/L 19  15  13      ASSESSMENT & PLAN Nicole Keith is a 82 y.o.female who presents to the clinic for follow up for multiple myeloma. She was under the care of Dr. Clelia Croft and will transfer to Dr. Leonides Schanz moving forward.   #IgG lambda multiple myeloma: --Initially presented in April 2016 with M spike of 3.2 g/dL and IgG level of 0865. Underwent bone marrow biopsy from 11/20/2014 show 29% plasma cells. 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  --In December 2019 with rising M spike of 5.9 g/dL and worsening anemia, with Hgb 8.7, she met criteria for transformation of active multiple myeloma.  --From 07/27/2018-09/2018, received weekly velcade plus dexamethasone. Therapy changed due to poor response.  --In 09/2018, started Revlimid 25 mg for 21 days on, 7 days off of a 28 day cycle with dexamethasone. Therapy was held after 2 cycles because of severe dermatological toxicity --In 12/2018, switched to Pomalyst 1 mg daily for 21 days.  She completed 4 cycles of therapy and therapy was held due to occurrence of dermatological toxicity.  --In 07/2019, switch to Ninlaro 4 mg weekly with dexamethasone 20 mg weekly for 3 weeks on  and 1 week off. Daratumumab was addd in April 2021. Clearnce Sorrel was discontinued in November 2021. --In 05/18/2020, started monthly daratumumab and transitioned to q 3 month schedule  in April 2022.  PLAN: --Due for another daratumumab infusion today that she receives every 3 months --Labs from today were reviewed, CBC unremarkable without cytopenias. CMP shows normal creatinine, calcium and LFT levels. Myeloma labs pending today. --Reviewed most recent SPEP/IFE from 07/16/2022 that showed rising M-protein measuring 0.7 g/dL.  --Discussed that with the rising M-protein levels, there is concern for relapse. We discussed possible options including increasing frequency of daratumumab from every 3 months to monthly versus change in therapy. I will discuss with Dr.Dorsey and we will follow up to finalize his recommendations --Reviewed bone met survey from 11/15/2019 that showed new fracture of the mid left clavicular and progressive lytic lesions concerning for myeloma involvement. We will repeat bone met survey to further evaluate especially with her ongoing right rib pain. Consider Zometa/Xgeva therapy if we can confirm myeloma lesions --She can proceed with daratumumab infusion today. Dr. Candise Che signed orders in the absence of Dr. Leonides Schanz. --RTC in 2 weeks with labs and follow up visit with Dr. Leonides Schanz   #Diffuse erythematous rash: --Managed with kenalog cream. Sent refill --Sent nystatin powder for groin area.  --Discussed referral to dermatology, patient deferred at this time.     Orders Placed This Encounter  Procedures   DG Bone Survey Met    Standing Status:   Future    Standing Expiration Date:   10/15/2023    Order Specific Question:   Reason for Exam (SYMPTOM  OR DIAGNOSIS REQUIRED)    Answer:   evaluate for lytic lesions    Order Specific Question:   Preferred imaging location?    Answer:   [facility]    All questions were answered. The patient knows to call the clinic with any problems, questions or concerns.  I have spent a total of 40 minutes minutes of face-to-face and non-face-to-face time, preparing to see the patient,  performing a medically  appropriate examination, counseling and educating the patient, ordering medications/tests/procedures,documenting clinical information in the electronic health record, and care coordination.   Georga Kaufmann, PA-C Department of Hematology/Oncology [facility] at [facility] Phone: [phone]

## 2022-10-15 NOTE — Patient Instructions (Signed)
[facility]  Discharge Instructions: Thank you for choosing [facility] to provide your oncology and hematology care.   If you have a lab appointment with the [facility], please go directly to the [facility] and check in at the registration area.   Wear comfortable clothing and clothing appropriate for easy access to any Portacath or PICC line.   We strive to give you quality time with your provider. You may need to reschedule your appointment if you arrive late (15 or more minutes).  Arriving late affects you and other patients whose appointments are after yours.  Also, if you miss three or more appointments without notifying the office, you may be dismissed from the clinic at the provider's discretion.      For prescription refill requests, have your pharmacy contact our office and allow 72 hours for refills to be completed.    Today you received the following chemotherapy and/or immunotherapy agents Darzalex-Faspro.      To help prevent nausea and vomiting after your treatment, we encourage you to take your nausea medication as directed.  BELOW ARE SYMPTOMS THAT SHOULD BE REPORTED IMMEDIATELY: *FEVER GREATER THAN 100.4 F (38 C) OR HIGHER *CHILLS OR SWEATING *NAUSEA AND VOMITING THAT IS NOT CONTROLLED WITH YOUR NAUSEA MEDICATION *UNUSUAL SHORTNESS OF BREATH *UNUSUAL BRUISING OR BLEEDING *URINARY PROBLEMS (pain or burning when urinating, or frequent urination) *BOWEL PROBLEMS (unusual diarrhea, constipation, pain near the anus) TENDERNESS IN MOUTH AND THROAT WITH OR WITHOUT PRESENCE OF ULCERS (sore throat, sores in mouth, or a toothache) UNUSUAL RASH, SWELLING OR PAIN  UNUSUAL VAGINAL DISCHARGE OR ITCHING   Items with * indicate a potential emergency and should be followed up as soon as possible or go to the Emergency Department if any problems should occur.  Please show the CHEMOTHERAPY ALERT CARD or IMMUNOTHERAPY ALERT CARD at  check-in to the Emergency Department and triage nurse.  Should you have questions after your visit or need to cancel or reschedule your appointment, please contact [facility]  Dept: [phone]  and follow the prompts.  Office hours are 8:00 a.m. to 4:30 p.m. Monday - Friday. Please note that voicemails left after 4:00 p.m. may not be returned until the following business day.  We are closed weekends and major holidays. You have access to a nurse at all times for urgent questions. Please call the main number to the clinic Dept: [phone] and follow the prompts.   For any non-urgent questions, you may also contact your provider using MyChart. We now offer e-Visits for anyone 27 and older to request care online for non-urgent symptoms. For details visit [email or web address].   Also download the MyChart app! Go to the app store, search "MyChart", open the app, select [facility], and log in with your MyChart username and password.

## 2022-10-16 ENCOUNTER — Encounter: Payer: Self-pay | Admitting: Oncology

## 2022-10-16 ENCOUNTER — Other Ambulatory Visit (HOSPITAL_COMMUNITY): Payer: Self-pay

## 2022-10-16 ENCOUNTER — Other Ambulatory Visit: Payer: Self-pay

## 2022-10-16 ENCOUNTER — Telehealth: Payer: Self-pay | Admitting: Family Medicine

## 2022-10-16 LAB — KAPPA/LAMBDA LIGHT CHAINS
Kappa free light chain: 3.3 mg/L (ref 3.3–19.4)
Kappa, lambda light chain ratio: 0.1 — ABNORMAL LOW (ref 0.26–1.65)
Lambda free light chains: 31.6 mg/L — ABNORMAL HIGH (ref 5.7–26.3)

## 2022-10-16 MED ORDER — NYSTATIN 100000 UNIT/GM EX POWD
1.0000 | Freq: Three times a day (TID) | CUTANEOUS | 0 refills | Status: DC
  Filled 2022-10-16: qty 15, 5d supply, fill #0

## 2022-10-16 MED ORDER — TRIAMCINOLONE ACETONIDE 0.1 % EX CREA
1.0000 | TOPICAL_CREAM | Freq: Two times a day (BID) | CUTANEOUS | 0 refills | Status: DC
  Filled 2022-10-16: qty 30, 15d supply, fill #0

## 2022-10-16 NOTE — Telephone Encounter (Signed)
Per 3/6 LOS scheduled patient.

## 2022-10-20 LAB — MULTIPLE MYELOMA PANEL, SERUM
Albumin SerPl Elph-Mcnc: 3.6 g/dL (ref 2.9–4.4)
Albumin/Glob SerPl: 1.3 (ref 0.7–1.7)
Alpha 1: 0.2 g/dL (ref 0.0–0.4)
Alpha2 Glob SerPl Elph-Mcnc: 0.7 g/dL (ref 0.4–1.0)
B-Globulin SerPl Elph-Mcnc: 0.8 g/dL (ref 0.7–1.3)
Gamma Glob SerPl Elph-Mcnc: 1.2 g/dL (ref 0.4–1.8)
Globulin, Total: 2.9 g/dL (ref 2.2–3.9)
IgA: 22 mg/dL — ABNORMAL LOW (ref 64–422)
IgG (Immunoglobin G), Serum: 1254 mg/dL (ref 586–1602)
IgM (Immunoglobulin M), Srm: 36 mg/dL (ref 26–217)
M Protein SerPl Elph-Mcnc: 0.9 g/dL — ABNORMAL HIGH
Total Protein ELP: 6.5 g/dL (ref 6.0–8.5)

## 2022-10-29 ENCOUNTER — Other Ambulatory Visit (HOSPITAL_COMMUNITY): Payer: Medicare Other

## 2022-10-29 ENCOUNTER — Other Ambulatory Visit: Payer: Medicare Other

## 2022-10-29 ENCOUNTER — Ambulatory Visit: Payer: Medicare Other | Admitting: Hematology and Oncology

## 2022-11-03 ENCOUNTER — Ambulatory Visit (HOSPITAL_COMMUNITY)
Admission: RE | Admit: 2022-11-03 | Discharge: 2022-11-03 | Disposition: A | Discharge status: Home / Self Care | Payer: Medicare Other | Source: Ambulatory Visit | Attending: Physician Assistant | Admitting: Physician Assistant

## 2022-11-03 DIAGNOSIS — S32010A Wedge compression fracture of first lumbar vertebra, initial encounter for closed fracture: Secondary | ICD-10-CM | POA: Diagnosis not present

## 2022-11-03 DIAGNOSIS — C9000 Multiple myeloma not having achieved remission: Secondary | ICD-10-CM | POA: Diagnosis not present

## 2022-11-06 ENCOUNTER — Encounter: Payer: Self-pay | Admitting: Hematology and Oncology

## 2022-11-06 ENCOUNTER — Other Ambulatory Visit: Payer: Self-pay

## 2022-11-06 ENCOUNTER — Inpatient Hospital Stay (HOSPITAL_BASED_OUTPATIENT_CLINIC_OR_DEPARTMENT_OTHER): Payer: Medicare Other | Admitting: Hematology and Oncology

## 2022-11-06 ENCOUNTER — Inpatient Hospital Stay: Payer: Medicare Other

## 2022-11-06 ENCOUNTER — Other Ambulatory Visit: Payer: Self-pay | Admitting: Hematology and Oncology

## 2022-11-06 VITALS — BP 135/70 | HR 71 | Temp 97.7°F | Resp 18 | Wt 128.7 lb

## 2022-11-06 DIAGNOSIS — C9000 Multiple myeloma not having achieved remission: Secondary | ICD-10-CM | POA: Diagnosis not present

## 2022-11-06 DIAGNOSIS — Z5112 Encounter for antineoplastic immunotherapy: Secondary | ICD-10-CM | POA: Diagnosis not present

## 2022-11-06 DIAGNOSIS — R21 Rash and other nonspecific skin eruption: Secondary | ICD-10-CM | POA: Diagnosis not present

## 2022-11-06 DIAGNOSIS — Z800 Family history of malignant neoplasm of digestive organs: Secondary | ICD-10-CM | POA: Diagnosis not present

## 2022-11-06 DIAGNOSIS — Z79899 Other long term (current) drug therapy: Secondary | ICD-10-CM | POA: Diagnosis not present

## 2022-11-06 LAB — CMP (CANCER CENTER ONLY)
ALT: 21 U/L (ref 0–44)
AST: 23 U/L (ref 15–41)
Albumin: 4.1 g/dL (ref 3.5–5.0)
Alkaline Phosphatase: 71 U/L (ref 38–126)
Anion gap: 7 (ref 5–15)
BUN: 14 mg/dL (ref 8–23)
CO2: 29 mmol/L (ref 22–32)
Calcium: 9.2 mg/dL (ref 8.9–10.3)
Chloride: 101 mmol/L (ref 98–111)
Creatinine: 0.78 mg/dL (ref 0.44–1.00)
GFR, Estimated: >60 mL/min (ref >60)
Glucose, Bld: 95 mg/dL (ref 70–99)
Potassium: 3.9 mmol/L (ref 3.5–5.1)
Sodium: 137 mmol/L (ref 135–145)
Total Bilirubin: 0.5 mg/dL (ref 0.3–1.2)
Total Protein: 7.3 g/dL (ref 6.5–8.1)

## 2022-11-06 LAB — CBC WITH DIFFERENTIAL (CANCER CENTER ONLY)
Abs Immature Granulocytes: 0.02 10*3/uL (ref 0.00–0.07)
Basophils Absolute: 0 10*3/uL (ref 0.0–0.1)
Basophils Relative: 1 %
Eosinophils Absolute: 0.1 10*3/uL (ref 0.0–0.5)
Eosinophils Relative: 1 %
HCT: 41.8 % (ref 36.0–46.0)
Hemoglobin: 14.3 g/dL (ref 12.0–15.0)
Immature Granulocytes: 0 %
Lymphocytes Relative: 29 %
Lymphs Abs: 2 10*3/uL (ref 0.7–4.0)
MCH: 31.6 pg (ref 26.0–34.0)
MCHC: 34.2 g/dL (ref 30.0–36.0)
MCV: 92.3 fL (ref 80.0–100.0)
Monocytes Absolute: 0.6 10*3/uL (ref 0.1–1.0)
Monocytes Relative: 8 %
Neutro Abs: 4.1 10*3/uL (ref 1.7–7.7)
Neutrophils Relative %: 61 %
Platelet Count: 340 10*3/uL (ref 150–400)
RBC: 4.53 MIL/uL (ref 3.87–5.11)
RDW: 13.2 % (ref 11.5–15.5)
WBC Count: 6.8 10*3/uL (ref 4.0–10.5)
nRBC: 0 % (ref 0.0–0.2)

## 2022-11-06 NOTE — Progress Notes (Signed)
[place] [phone]   [phone]  PROGRESS NOTE  Patient Care Team: [name], MD as PCP - General (Family Medicine)  CHIEF COMPLAINTS/PURPOSE OF CONSULTATION:  IgG Lambda Multiple Myeloma  ONCOLOGIC HISTORY: 07/27/2018-09/2018: Received weekly velcade plus dexamethasone. Therapy changed due to poor response.  09/2018: Started Revlimid 25 mg for 21 days on, 7 days off of a 28 day cycle with dexamethasone. Therapy was held after 2 cycles because of severe dermatological toxicity 12/2018: Pomalyst 1 mg daily for 21 days.  She completed 4 cycles of therapy and therapy was held due to occurrence of dermatological toxicity.  07/2019: Started Ninlaro 4 mg weekly with dexamethasone 20 mg weekly for 3 weeks on and 1 week off. Daratumumab was addd in April 2021. [name] was discontinued in November 2021. 05/18/2020: Started monthly daratumumab and transitioned to q 3 month schedule in April 2022.  11/06/2022: Darzalex discontinued due to progression of disease. M protein 0.9 with worsening lytic lesions in the skeleton.  11/20/2022: planned start of Kyprolis/Dex therapy.   HISTORY OF PRESENTING ILLNESS:  Nicole Keith 82 y.o. female returns for a follow up for IgG lambda multiple myeloma. She was last seen by Dr. Alen Blew on 10/15/2022. She presents today for discussion of plan moving forward given progression of disease.   On exam today, [name] reports she has felt well overall interim since her last visit.  She reports that she does have some issues with the Darzalex shots including some occasional memory loss as well as rash/itchiness.  She has been using the triamcinolone cream with some relief.  The rash is predominantly on her arms and legs.  She reports that she has been having some issues with joint and rib discomfort and some occasional loose stools with the Darzalex.. She denies fevers, chills, sweats, shortness of breath, chest pain or cough.  She has no other complaints. Rest of the 10 point ROS is below;[name] Kitchen   The bulk of our discussion today focused on the progression of disease and the steps moving forward.  She was agreeable to [facility] therapy as noted below.  MEDICAL HISTORY:  Past Medical History:  Diagnosis Date   Allergic rhinitis    Arthritis    Colitis, collagenous    Diverticular disease    severe sig tics/fixation 2012   Family hx of colon cancer    Fracture of femoral neck, right ([place]) 01/09/2013   GERD (gastroesophageal reflux disease)    exacerbation of reflux-like symptoms 04/2011   Hiatal hernia    Hyperlipidemia    Osteoarthritis of right hip 01/10/2013    SURGICAL HISTORY: Past Surgical History:  Procedure Laterality Date   CHOLECYSTECTOMY     EYE SURGERY  feb 2016   cataract right eye   LEG SURGERY Left    TONSILLECTOMY     TOTAL HIP ARTHROPLASTY Right 01/10/2013   Procedure: TOTAL HIP ARTHROPLASTY;  Surgeon: Johnny Bridge, MD;  Location: [facility];  Service: Orthopedics;  Laterality: Right;    SOCIAL HISTORY: Social History   Socioeconomic History   Marital status: Married    Spouse name: Not on file   Number of children: 0   Years of education: Not on file   Highest education level: Some college, no degree  Occupational History   Occupation: retired  Tobacco Use   Smoking status: Former    Types: Cigarettes    Quit date: 01/10/1972    Years since quitting: 50.8   Smokeless tobacco: Never  Tobacco comments:    quit 1973  Vaping Use   Vaping Use: Never used  Substance and Sexual Activity   Alcohol use: No   Drug use: No   Sexual activity: Not on file  Other Topics Concern   Not on file  Social History Narrative   Married, no children. Drinks 2 cups decaf coffee a day. Walks daily. Is active, plays golf, she and her husband go hiking frequently. 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   Social Determinants of Health   Financial Resource Strain: Not on file  Food  Insecurity: Not on file  Transportation Needs: Not on file  Physical Activity: Not on file  Stress: Not on file  Social Connections: Not on file  Intimate Partner Violence: Not on file    FAMILY HISTORY: Family History  Problem Relation Age of Onset   Congestive Heart Failure Mother    Arthritis-Osteo Mother    Colon cancer Mother    Heart attack Father    Rheum arthritis Father    Colon cancer Maternal Aunt    Colon cancer Maternal Aunt    Hypothyroidism Sister     ALLERGIES:  is allergic to pomalyst [pomalidomide], revlimid [lenalidomide], betadine [povidone iodine], flagyl [metronidazole], fosamax [alendronate sodium], bactrim [sulfamethoxazole-trimethoprim], clindamycin/lincomycin, doxepin hcl, hydrocodone-acetaminophen, hydroxyzine, hydroxyzine hcl, tramadol hcl, valium [diazepam], actonel [risedronate sodium], boniva [ibandronic acid], doxycycline, and penicillins.  MEDICATIONS:  Current Outpatient Medications  Medication Sig Dispense Refill   acetaminophen (TYLENOL) 500 MG tablet Take 500 mg by mouth in the morning, at noon, and at bedtime.      diphenhydramine-acetaminophen (TYLENOL PM) 25-500 MG TABS tablet Take 2 tablets by mouth at bedtime as needed (for sleep).      famotidine-calcium carbonate-magnesium hydroxide (PEPCID COMPLETE) 10-800-165 MG chewable tablet Chew 1 tablet by mouth daily as needed (for heartburn or indigestion).      ibuprofen (ADVIL) 400 MG tablet Take 400 mg by mouth every 6 (six) hours.     magic mouthwash SOLN Take 5 mLs by mouth 3 (three) times daily as needed for mouth pain. (Patient not taking: Reported on 07/16/2022) 30 mL 0   nystatin (MYCOSTATIN/NYSTOP) powder Apply 1 Application topically 3 (three) times daily. 15 g 0   Polyethyl Glycol-Propyl Glycol (SYSTANE) 0.4-0.3 % SOLN Place 1 drop into both eyes in the morning and at bedtime.  (Patient not taking: Reported on 07/16/2022)     pravastatin (PRAVACHOL) 10 MG tablet Take 10 mg by mouth daily.  (Patient not taking: Reported on 10/15/2022)     triamcinolone cream (KENALOG) 0.1 % Apply 1 Application topically 2 (two) times daily. 30 g 0   No current facility-administered medications for this visit.    REVIEW OF SYSTEMS:   Constitutional: ( - ) fevers, ( - )  chills , ( - ) night sweats Eyes: ( - ) blurriness of vision, ( - ) double vision, ( - ) watery eyes Ears, nose, mouth, throat, and face: ( - ) mucositis, ( - ) sore throat Respiratory: ( - ) cough, ( - ) dyspnea, ( - ) wheezes Cardiovascular: ( - ) palpitation, ( - ) chest discomfort, ( - ) lower extremity swelling Gastrointestinal:  ( - ) nausea, ( - ) heartburn, ( - ) change in bowel habits Skin: ( +) abnormal skin rashes Lymphatics: ( - ) new lymphadenopathy, ( - ) easy bruising Neurological: ( - ) numbness, ( - ) tingling, ( - ) new weaknesses Behavioral/Psych: ( - )  mood change, ( - ) new changes  All other systems were reviewed with the patient and are negative.  PHYSICAL EXAMINATION: ECOG PERFORMANCE STATUS: 1 - Symptomatic but completely ambulatory  Vitals:   11/06/22 1400  BP: 135/70  Pulse: 71  Resp: 18  Temp: 97.7 F (36.5 C)  SpO2: 98%    Filed Weights   11/06/22 1400  Weight: 128 lb 11.2 oz (58.4 kg)     GENERAL: well appearing female in NAD  SKIN: skin color, texture, turgor are normal. Diffuse, flat erythematous rash.  EYES: conjunctiva are pink and non-injected, sclera clear LYMPH:  no palpable lymphadenopathy in the cervical or supraclavicular lymph nodes.  LUNGS: clear to auscultation and percussion with normal breathing effort HEART: regular rate & rhythm and no murmurs and no lower extremity edema Musculoskeletal: no cyanosis of digits and no clubbing  PSYCH: alert & oriented x 3, fluent speech NEURO: no focal motor/sensory deficits  LABORATORY DATA:  I have reviewed the data as listed    Latest Ref Rng & Units 11/06/2022    1:43 PM 10/15/2022   10:43 AM 07/16/2022    8:48 AM  CBC   WBC 4.0 - 10.5 K/uL 6.8  8.5  7.9   Hemoglobin 12.0 - 15.0 g/dL 14.3  14.3  13.5   Hematocrit 36.0 - 46.0 % 41.8  42.0  40.9   Platelets 150 - 400 K/uL 340  340  325        Latest Ref Rng & Units 11/06/2022    1:43 PM 10/15/2022   10:43 AM 07/16/2022    8:48 AM  CMP  Glucose 70 - 99 mg/dL 95  103  111   BUN 8 - 23 mg/dL 14  12  13    Creatinine 0.44 - 1.00 mg/dL 0.78  0.78  0.77   Sodium 135 - 145 mmol/L 137  138  139   Potassium 3.5 - 5.1 mmol/L 3.9  4.3  4.2   Chloride 98 - 111 mmol/L 101  102  104   CO2 22 - 32 mmol/L 29  31  29    Calcium 8.9 - 10.3 mg/dL 9.2  9.1  9.2   Total Protein 6.5 - 8.1 g/dL 7.3  7.1  6.5   Total Bilirubin 0.3 - 1.2 mg/dL 0.5  0.5  0.5   Alkaline Phos 38 - 126 U/L 71  58  60   AST 15 - 41 U/L 23  22  20    ALT 0 - 44 U/L 21  19  15      ASSESSMENT & PLAN Nicole Keith is a 82 y.o.female who presents to the clinic for follow up for multiple myeloma. She was under the care of Dr. Alen Blew and will transfer to Dr. Lorenso Courier moving forward.   #IgG Lambda Multiple Myeloma: --Initially presented in April 2016 with M spike of 3.2 g/dL and IgG level of 4685. Underwent bone marrow biopsy from 11/20/2014 show 29% plasma cells. 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  --In December 2019 with rising M spike of 5.9 g/dL and worsening anemia, with Hgb 8.7, she met criteria for transformation of active multiple myeloma.  --From 07/27/2018-09/2018, received weekly velcade plus dexamethasone. Therapy changed due to poor response.  --In 09/2018, started Revlimid 25 mg for 21 days on, 7 days off of a 28 day cycle with dexamethasone. Therapy was held after 2 cycles because of severe dermatological toxicity --In 12/2018, switched to Pomalyst 1 mg daily for 21 days.  She completed 4 cycles of therapy and therapy was held due to occurrence of dermatological toxicity.  --In 07/2019, switch to Ninlaro 4 mg weekly with dexamethasone 20 mg weekly for 3 weeks on and 1  week off. Daratumumab was addd in April 2021. [name] was discontinued in November 2021. --In 05/18/2020, started monthly daratumumab and transitioned to q 3 month schedule in April 2022.  PLAN: --Labs from today were reviewed, M protein 0.9 Cr 0.78, LFTs WNL, WBC 6.8, hgb 14.3, MCV 92.3, Plt 340. --Discussed that with the rising M-protein levels, there is concern for relapse.  --Bone survey on 11/03/2022 shows progression of disease with worsening lytic lesions of the skeleton. --After detailed discussion with the risks and benefits of continued treatment the patient was agreeable to continuing with Kyprolis/Dex therapy.  Will plan to have the start on 11/20/2022. --RTC for start of Kyprolis/Dex.  #Diffuse erythematous rash: --Managed with kenalog cream. -- nystatin powder for groin area.  --Discussed referral to dermatology, patient deferred at this time.   Orders Placed This Encounter  Procedures   CBC with Differential ([place] Only)    Standing Status:   Future    Standing Expiration Date:   11/20/2023   CMP ([place] only)    Standing Status:   Future    Standing Expiration Date:   11/20/2023   CBC with Differential ([place] Only)    Standing Status:   Future    Standing Expiration Date:   11/27/2023   CMP ([facility] only)    Standing Status:   Future    Standing Expiration Date:   11/27/2023   CBC with Differential ([place] Only)    Standing Status:   Future    Standing Expiration Date:   12/04/2023   CMP ([place] only)    Standing Status:   Future    Standing Expiration Date:   12/04/2023   All questions were answered. The patient knows to call the clinic with any problems, questions or concerns.  I have spent a total of 30 minutes minutes of face-to-face and non-face-to-face time, preparing to see the patient,  performing a medically appropriate examination, counseling and educating the patient, ordering medications/tests/procedures,documenting  clinical information in the electronic health record, and care coordination.   Ledell Peoples, MD Department of Hematology/Oncology [place] at [facility] Phone: [phone] Pager: [phone] Email: [email or web address]

## 2022-11-06 NOTE — Progress Notes (Signed)
DISCONTINUE ON PATHWAY REGIMEN - Multiple Myeloma and Other Plasma Cell Dyscrasias     Cycles 1 and 2: A cycle is every 28 days:     Daratumumab    Cycles 3 through 6: A cycle is every 28 days:     Daratumumab    Cycles 7 and beyond: A cycle is every 28 days:     Daratumumab   **Always confirm dose/schedule in your pharmacy ordering system**  REASON: Disease Progression PRIOR TREATMENT: [id number]: Daratumumab 16 mg/kg IV q28 Days Until Progression or Unacceptable Toxicity TREATMENT RESPONSE: Partial Response (PR)  START OFF PATHWAY REGIMEN - Multiple Myeloma and Other Plasma Cell Dyscrasias   [id number] - Weekly (Carfilzomib 20/70 mg/m2 + Dexamethasone 40 mg) q28 Days:   A cycle is every 28 days:     Carfilzomib      Carfilzomib      Carfilzomib      Dexamethasone      Dexamethasone   **Always confirm dose/schedule in your pharmacy ordering system**  Patient Characteristics: Multiple Myeloma, Relapsed / Refractory, Second through Fourth Lines of Therapy, Frail or Not a Candidate for Triplet Therapy Disease Classification: Multiple Myeloma R-ISS Staging: III Therapeutic Status: Relapsed Line of Therapy: Fourth Line Intent of Therapy: Non-Curative / Palliative Intent, Discussed with Patient

## 2022-11-07 ENCOUNTER — Telehealth: Payer: Self-pay | Admitting: Hematology and Oncology

## 2022-11-07 NOTE — Telephone Encounter (Signed)
Per 3/28 LOS reached out to patient to schedule.

## 2022-11-11 ENCOUNTER — Other Ambulatory Visit: Payer: Self-pay

## 2022-11-13 NOTE — Progress Notes (Signed)
Pharmacist Chemotherapy Monitoring - Initial Assessment    Anticipated start date: 11/20/22   The following has been reviewed per standard work regarding the patient's treatment regimen: The patient's diagnosis, treatment plan and drug doses, and organ/hematologic function Lab orders and baseline tests specific to treatment regimen  The treatment plan start date, drug sequencing, and pre-medications Prior authorization status  Patient's documented medication list, including drug-drug interaction screen and prescriptions for anti-emetics and supportive care specific to the treatment regimen The drug concentrations, fluid compatibility, administration routes, and timing of the medications to be used The patient's access for treatment and lifetime cumulative dose history, if applicable  The patient's medication allergies and previous infusion related reactions, if applicable   Changes made to treatment plan:  N/A  Follow up needed:  N/A   Nicole Keith, [place], 11/13/2022  4:24 PM

## 2022-11-18 ENCOUNTER — Telehealth: Payer: Self-pay | Admitting: Hematology and Oncology

## 2022-11-18 NOTE — Telephone Encounter (Signed)
patient called to verify times and dates of appointments.

## 2022-11-19 ENCOUNTER — Encounter: Payer: Self-pay | Admitting: Physician Assistant

## 2022-11-19 NOTE — Progress Notes (Signed)
Received call from patient regarding copay assistance and re-enrollment for treatment. Advised patient this is now being handled by a different department([facility])[phone]. She verbalized understanding and will give them a call.  She has my card for any additional financial questions or concerns.

## 2022-11-20 ENCOUNTER — Inpatient Hospital Stay: Payer: Medicare Other | Attending: Physician Assistant

## 2022-11-20 ENCOUNTER — Inpatient Hospital Stay: Payer: Medicare Other

## 2022-11-20 ENCOUNTER — Other Ambulatory Visit: Payer: Self-pay | Admitting: *Deleted

## 2022-11-20 ENCOUNTER — Other Ambulatory Visit (HOSPITAL_COMMUNITY): Payer: Self-pay

## 2022-11-20 ENCOUNTER — Telehealth: Payer: Self-pay | Admitting: *Deleted

## 2022-11-20 ENCOUNTER — Inpatient Hospital Stay: Payer: Medicare Other | Admitting: Hematology and Oncology

## 2022-11-20 ENCOUNTER — Other Ambulatory Visit: Payer: Self-pay

## 2022-11-20 VITALS — BP 120/68 | HR 66 | Resp 16

## 2022-11-20 VITALS — BP 137/71 | HR 70 | Temp 97.7°F | Resp 17 | Ht 66.0 in | Wt 128.5 lb

## 2022-11-20 DIAGNOSIS — Z5112 Encounter for antineoplastic immunotherapy: Secondary | ICD-10-CM | POA: Insufficient documentation

## 2022-11-20 DIAGNOSIS — C9000 Multiple myeloma not having achieved remission: Secondary | ICD-10-CM

## 2022-11-20 DIAGNOSIS — Z79899 Other long term (current) drug therapy: Secondary | ICD-10-CM | POA: Insufficient documentation

## 2022-11-20 LAB — CBC WITH DIFFERENTIAL (CANCER CENTER ONLY)
Abs Immature Granulocytes: 0.02 10*3/uL (ref 0.00–0.07)
Basophils Absolute: 0.1 10*3/uL (ref 0.0–0.1)
Basophils Relative: 1 %
Eosinophils Absolute: 0.1 10*3/uL (ref 0.0–0.5)
Eosinophils Relative: 1 %
HCT: 42.5 % (ref 36.0–46.0)
Hemoglobin: 14.2 g/dL (ref 12.0–15.0)
Immature Granulocytes: 0 %
Lymphocytes Relative: 19 %
Lymphs Abs: 1.5 10*3/uL (ref 0.7–4.0)
MCH: 31.6 pg (ref 26.0–34.0)
MCHC: 33.4 g/dL (ref 30.0–36.0)
MCV: 94.4 fL (ref 80.0–100.0)
Monocytes Absolute: 0.6 10*3/uL (ref 0.1–1.0)
Monocytes Relative: 7 %
Neutro Abs: 6 10*3/uL (ref 1.7–7.7)
Neutrophils Relative %: 72 %
Platelet Count: 326 10*3/uL (ref 150–400)
RBC: 4.5 MIL/uL (ref 3.87–5.11)
RDW: 13.4 % (ref 11.5–15.5)
WBC Count: 8.2 10*3/uL (ref 4.0–10.5)
nRBC: 0 % (ref 0.0–0.2)

## 2022-11-20 LAB — CMP (CANCER CENTER ONLY)
ALT: 19 U/L (ref 0–44)
AST: 21 U/L (ref 15–41)
Albumin: 4 g/dL (ref 3.5–5.0)
Alkaline Phosphatase: 65 U/L (ref 38–126)
Anion gap: 6 (ref 5–15)
BUN: 13 mg/dL (ref 8–23)
CO2: 29 mmol/L (ref 22–32)
Calcium: 9.2 mg/dL (ref 8.9–10.3)
Chloride: 104 mmol/L (ref 98–111)
Creatinine: 0.83 mg/dL (ref 0.44–1.00)
GFR, Estimated: >60 mL/min (ref >60)
Glucose, Bld: 102 mg/dL — ABNORMAL HIGH (ref 70–99)
Potassium: 4 mmol/L (ref 3.5–5.1)
Sodium: 139 mmol/L (ref 135–145)
Total Bilirubin: 0.6 mg/dL (ref 0.3–1.2)
Total Protein: 7 g/dL (ref 6.5–8.1)

## 2022-11-20 MED ORDER — SODIUM CHLORIDE 0.9 % IV SOLN: Freq: Once | INTRAVENOUS | Status: AC

## 2022-11-20 MED ORDER — SODIUM CHLORIDE 0.9 % IV SOLN
40.0000 mg | Freq: Once | INTRAVENOUS | Status: AC
  Administered 2022-11-20: 40 mg via INTRAVENOUS
  Filled 2022-11-20: qty 4

## 2022-11-20 MED ORDER — TRIAMCINOLONE ACETONIDE 0.1 % EX CREA
1.0000 | TOPICAL_CREAM | Freq: Two times a day (BID) | CUTANEOUS | 2 refills | Status: AC
  Filled 2022-11-20: qty 454, 30d supply, fill #0

## 2022-11-20 MED ORDER — DEXTROSE 5 % IV SOLN
20.0000 mg/m2 | Freq: Once | INTRAVENOUS | Status: AC
  Administered 2022-11-20: 30 mg via INTRAVENOUS
  Filled 2022-11-20: qty 15

## 2022-11-20 NOTE — Telephone Encounter (Signed)
Chart opened in error

## 2022-11-20 NOTE — Patient Instructions (Signed)
[place] [facility]  Discharge Instructions: Thank you for choosing [facility] to provide your oncology and hematology care.   If you have a lab appointment with the Cancer Center, please go directly to the Cancer Center and check in at the registration area.   Wear comfortable clothing and clothing appropriate for easy access to any Portacath or PICC line.   We strive to give you quality time with your provider. You may need to reschedule your appointment if you arrive late (15 or more minutes).  Arriving late affects you and other patients whose appointments are after yours.  Also, if you miss three or more appointments without notifying the office, you may be dismissed from the clinic at the provider's discretion.      For prescription refill requests, have your pharmacy contact our office and allow 72 hours for refills to be completed.    Today you received the following chemotherapy and/or immunotherapy agents Kyprolis      To help prevent nausea and vomiting after your treatment, we encourage you to take your nausea medication as directed.  BELOW ARE SYMPTOMS THAT SHOULD BE REPORTED IMMEDIATELY: *FEVER GREATER THAN 100.4 F (38 C) OR HIGHER *CHILLS OR SWEATING *NAUSEA AND VOMITING THAT IS NOT CONTROLLED WITH YOUR NAUSEA MEDICATION *UNUSUAL SHORTNESS OF BREATH *UNUSUAL BRUISING OR BLEEDING *URINARY PROBLEMS (pain or burning when urinating, or frequent urination) *BOWEL PROBLEMS (unusual diarrhea, constipation, pain near the anus) TENDERNESS IN MOUTH AND THROAT WITH OR WITHOUT PRESENCE OF ULCERS (sore throat, sores in mouth, or a toothache) UNUSUAL RASH, SWELLING OR PAIN  UNUSUAL VAGINAL DISCHARGE OR ITCHING   Items with * indicate a potential emergency and should be followed up as soon as possible or go to the Emergency Department if any problems should occur.  Please show the CHEMOTHERAPY ALERT CARD or IMMUNOTHERAPY ALERT CARD at  check-in to the Emergency Department and triage nurse.  Should you have questions after your visit or need to cancel or reschedule your appointment, please contact [place] [facility]  Dept: [phone]  and follow the prompts.  Office hours are 8:00 a.m. to 4:30 p.m. Monday - Friday. Please note that voicemails left after 4:00 p.m. may not be returned until the following business day.  We are closed weekends and major holidays. You have access to a nurse at all times for urgent questions. Please call the main number to the clinic Dept: [phone] and follow the prompts.   For any non-urgent questions, you may also contact your provider using MyChart. We now offer e-Visits for anyone 5718 and older to request care online for non-urgent symptoms. For details visit [email or web address].   Also download the MyChart app! Go to the app store, search "MyChart", open the app, select , and log in with your MyChart username and password.   Carfilzomib Injection What is this medication? CARFILZOMIB (kar FILZ oh mib) treats multiple myeloma, a type of bone marrow cancer. It works by blocking a protein that causes cancer cells to grow and multiply. This helps to slow or stop the spread of cancer cells. This medicine may be used for other purposes; ask your health care provider or pharmacist if you have questions. COMMON BRAND NAME(S): KYPROLIS What should I tell my care team before I take this medication? They need to know if you have any of these conditions: Heart disease History of blood clots Irregular heartbeat Kidney disease Liver disease Lung or  breathing disease An unusual or allergic reaction to carfilzomib, or other medications, foods, dyes, or preservatives If you or your partner are pregnant or trying to get pregnant Breastfeeding How should I use this medication? This medication is injected into a vein. It is given by your care team in a hospital  or clinic setting. Talk to your care team about the use of this medication in children. Special care may be needed. Overdosage: If you think you have taken too much of this medicine contact a poison control center or emergency room at once. NOTE: This medicine is only for you. Do not share this medicine with others. What if I miss a dose? Keep appointments for follow-up doses. It is important not to miss your dose. Call your care team if you are unable to keep an appointment. What may interact with this medication? Interactions are not expected. This list may not describe all possible interactions. Give your health care provider a list of all the medicines, herbs, non-prescription drugs, or dietary supplements you use. Also tell them if you smoke, drink alcohol, or use illegal drugs. Some items may interact with your medicine. What should I watch for while using this medication? Your condition will be monitored carefully while you are receiving this medication. You may need blood work while taking this medication. Check with your care team if you have severe diarrhea, nausea, and vomiting, or if you sweat a lot. The loss of too much body fluid may make it dangerous for you to take this medication. This medication may affect your coordination, reaction time, or judgment. Do not drive or operate machinery until you know how this medication affects you. Sit up or stand slowly to reduce the risk of dizzy or fainting spells. Drinking alcohol with this medication can increase the risk of these side effects. Talk to your care team if you may be pregnant. Serious birth defects can occur if you take this medication during pregnancy and for 6 months after the last dose. You will need a negative pregnancy test before starting this medication. Contraception is recommended while taking this medication and for 6 months after the last dose. Your care team can help you find an option that works for you. If your  partner can get pregnant, use a condom during sex while taking this medication and for 3 months after the last dose. Do not breastfeed while taking this medication and for 2 weeks after the last dose. This medication may cause infertility. Talk to your care team if you are concerned about your fertility. What side effects may I notice from receiving this medication? Side effects that you should report to your care team as soon as possible: Allergic reactions--skin rash, itching, hives, swelling of the face, lips, tongue, or throat Bleeding--bloody or black, tar-like stools, vomiting blood or brown material that looks like coffee grounds, red or dark brown urine, small red or purple spots on skin, unusual bruising or bleeding Blood clot--pain, swelling, or warmth in the leg, shortness of breath, chest pain Dizziness, loss of balance or coordination, confusion or trouble speaking Heart attack--pain or tightness in the chest, shoulders, arms, or jaw, nausea, shortness of breath, cold or clammy skin, feeling faint or lightheaded Heart failure--shortness of breath, swelling of the ankles, feet, or hands, sudden weight gain, unusual weakness or fatigue Heart rhythm changes--fast or irregular heartbeat, dizziness, feeling faint or lightheaded, chest pain, trouble breathing Increase in blood pressure Infection--fever, chills, cough, sore throat, wounds that don't heal, pain  or trouble when passing urine, general feeling of discomfort or being unwell Infusion reactions--chest pain, shortness of breath or trouble breathing, feeling faint or lightheaded Kidney injury--decrease in the amount of urine, swelling of the ankles, hands, or feet Liver injury--right upper belly pain, loss of appetite, nausea, light-colored stool, dark yellow or brown urine, yellowing skin or eyes, unusual weakness or fatigue Lung injury--shortness of breath or trouble breathing, cough, spitting up blood, chest pain, fever Pulmonary  hypertension--shortness of breath, chest pain, fast or irregular heartbeat, feeling faint or lightheaded, fatigue, swelling of the ankles or feet Stomach pain, bloody diarrhea, pale skin, unusual weakness or fatigue, decrease in the amount of urine, which may be signs of hemolytic uremic syndrome Sudden and severe headache, confusion, change in vision, seizures, which may be signs of posterior reversible encephalopathy syndrome (PRES) TTP--purple spots on the skin or inside the mouth, pale skin, yellowing skin or eyes, unusual weakness or fatigue, fever, fast or irregular heartbeat, confusion, change in vision, trouble speaking, trouble walking Tumor lysis syndrome (TLS)--nausea, vomiting, diarrhea, decrease in the amount of urine, dark urine, unusual weakness or fatigue, confusion, muscle pain or cramps, fast or irregular heartbeat, joint pain Side effects that usually do not require medical attention (report to your care team if they continue or are bothersome): Diarrhea Fatigue Nausea Trouble sleeping This list may not describe all possible side effects. Call your doctor for medical advice about side effects. You may report side effects to FDA at 1-800-FDA-1088. Where should I keep my medication? This medication is given in a hospital or clinic. It will not be stored at home. NOTE: This sheet is a summary. It may not cover all possible information. If you have questions about this medicine, talk to your doctor, pharmacist, or health care provider.  2023 Elsevier/Gold Standard (2020-09-06 00:00:00)

## 2022-11-20 NOTE — Progress Notes (Signed)
[facility] [phone]   [phone]  PROGRESS NOTE  Patient Care Team: Farris HasMorrow, Aaron, MD as PCP - General (Family Medicine)  CHIEF COMPLAINTS/PURPOSE OF CONSULTATION:  IgG Lambda Multiple Myeloma  ONCOLOGIC HISTORY: 07/27/2018-09/2018: Received weekly velcade plus dexamethasone. Therapy changed due to poor response.  09/2018: Started Revlimid 25 mg for 21 days on, 7 days off of a 28 day cycle with dexamethasone. Therapy was held after 2 cycles because of severe dermatological toxicity 12/2018: Pomalyst 1 mg daily for 21 days.  She completed 4 cycles of therapy and therapy was held due to occurrence of dermatological toxicity.  07/2019: Started Ninlaro 4 mg weekly with dexamethasone 20 mg weekly for 3 weeks on and 1 week off. Daratumumab was addd in April 2021. Clearnce Sorrelinlaro was discontinued in November 2021. 05/18/2020: Started monthly daratumumab and transitioned to q 3 month schedule in April 2022.  11/06/2022: Darzalex discontinued due to progression of disease. M protein 0.9 with worsening lytic lesions in the skeleton.  11/20/2022: Cycle 1 Day 1 of Kyprolis/Dex therapy.   HISTORY OF PRESENTING ILLNESS:  Nicole Keith 82 y.o. female returns for a follow up for IgG lambda multiple myeloma. She was last seen on 11/06/2022. She presents today for the start of Kyprolis/Dex.   On exam today, Nicole Keith she has been well overall in the interim since her last visit.  She Keith she is doing her best to try to stay hydrated and drink plenty of water.  She is avoiding caffeine.  She notes her energy and appetite are excellent.  She has been using Kenalog steroid cream for her rash and has been improving.  Overall she is willing and able to proceed with treatment today.. She denies fevers, chills, sweats, shortness of breath, chest pain or cough. She has no other complaints. Rest of the 10 point ROS is below;[name] Kitchen.   The bulk of our discussion today focused on  assuring she had everything necessary to start treatment today.  All of her questions and concerns were addressed.  MEDICAL HISTORY:  Past Medical History:  Diagnosis Date   Allergic rhinitis    Arthritis    Colitis, collagenous    Diverticular disease    severe sig tics/fixation 2012   Family hx of colon cancer    Fracture of femoral neck, right (HCC) 01/09/2013   GERD (gastroesophageal reflux disease)    exacerbation of reflux-like symptoms 04/2011   Hiatal hernia    Hyperlipidemia    Osteoarthritis of right hip 01/10/2013    SURGICAL HISTORY: Past Surgical History:  Procedure Laterality Date   CHOLECYSTECTOMY     EYE SURGERY  feb 2016   cataract right eye   LEG SURGERY Left    TONSILLECTOMY     TOTAL HIP ARTHROPLASTY Right 01/10/2013   Procedure: TOTAL HIP ARTHROPLASTY;  Surgeon: Eulas PostJoshua P Landau, MD;  Location: [facility] OR;  Service: Orthopedics;  Laterality: Right;    SOCIAL HISTORY: Social History   Socioeconomic History   Marital status: Married    Spouse name: Not on file   Number of children: 0   Years of education: Not on file   Highest education level: Some college, no degree  Occupational History   Occupation: retired  Tobacco Use   Smoking status: Former    Types: Cigarettes    Quit date: 01/10/1972    Years since quitting: 50.8   Smokeless tobacco: Never   Tobacco comments:    quit 1973  Vaping Use  Vaping Use: Never used  Substance and Sexual Activity   Alcohol use: No   Drug use: No   Sexual activity: Not on file  Other Topics Concern   Not on file  Social History Narrative   Married, no children. Drinks 2 cups decaf coffee a day. Walks daily. Is active, plays golf, she and her husband go hiking frequently. 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   Social Determinants of Health   Financial Resource Strain: Not on file  Food Insecurity: Not on file  Transportation Needs: Not on file  Physical Activity: Not on file  Stress: Not on file   Social Connections: Not on file  Intimate Partner Violence: Not on file    FAMILY HISTORY: Family History  Problem Relation Age of Onset   Congestive Heart Failure Mother    Arthritis-Osteo Mother    Colon cancer Mother    Heart attack Father    Rheum arthritis Father    Colon cancer Maternal Aunt    Colon cancer Maternal Aunt    Hypothyroidism Sister     ALLERGIES:  is allergic to pomalyst [pomalidomide], revlimid [lenalidomide], betadine [povidone iodine], flagyl [metronidazole], fosamax [alendronate sodium], bactrim [sulfamethoxazole-trimethoprim], clindamycin/lincomycin, doxepin hcl, hydrocodone-acetaminophen, hydroxyzine, hydroxyzine hcl, tramadol hcl, valium [diazepam], actonel [risedronate sodium], boniva [ibandronic acid], doxycycline, and penicillins.  MEDICATIONS:  Current Outpatient Medications  Medication Sig Dispense Refill   acetaminophen (TYLENOL) 500 MG tablet Take 500 mg by mouth in the morning, at noon, and at bedtime.      diphenhydramine-acetaminophen (TYLENOL PM) 25-500 MG TABS tablet Take 2 tablets by mouth at bedtime as needed (for sleep).      famotidine-calcium carbonate-magnesium hydroxide (PEPCID COMPLETE) 10-800-165 MG chewable tablet Chew 1 tablet by mouth daily as needed (for heartburn or indigestion).      ibuprofen (ADVIL) 400 MG tablet Take 400 mg by mouth every 6 (six) hours.     triamcinolone cream (KENALOG) 0.1 % Apply 1 Application topically 2 (two) times daily. 453.6 g 2   No current facility-administered medications for this visit.   Facility-Administered Medications Ordered in Other Visits  Medication Dose Route Frequency Provider Last Rate Last Admin   0.9 %  sodium chloride infusion   Intravenous Once Ulysees Barns IV, MD       0.9 %  sodium chloride infusion   Intravenous Once Jaci Standard, MD       carfilzomib (KYPROLIS) 30 mg in dextrose 5 % 50 mL chemo infusion  20 mg/m2 (Treatment Plan Recorded) Intravenous Once Jaci Standard, MD       dexamethasone (DECADRON) 40 mg in sodium chloride 0.9 % 50 mL IVPB  40 mg Intravenous Once Jaci Standard, MD        REVIEW OF SYSTEMS:   Constitutional: ( - ) fevers, ( - )  chills , ( - ) night sweats Eyes: ( - ) blurriness of vision, ( - ) double vision, ( - ) watery eyes Ears, nose, mouth, throat, and face: ( - ) mucositis, ( - ) sore throat Respiratory: ( - ) cough, ( - ) dyspnea, ( - ) wheezes Cardiovascular: ( - ) palpitation, ( - ) chest discomfort, ( - ) lower extremity swelling Gastrointestinal:  ( - ) nausea, ( - ) heartburn, ( - ) change in bowel habits Skin: ( +) abnormal skin rashes Lymphatics: ( - ) new lymphadenopathy, ( - ) easy bruising Neurological: ( - ) numbness, ( - )  tingling, ( - ) new weaknesses Behavioral/Psych: ( - ) mood change, ( - ) new changes  All other systems were reviewed with the patient and are negative.  PHYSICAL EXAMINATION: ECOG PERFORMANCE STATUS: 1 - Symptomatic but completely ambulatory  Vitals:   11/20/22 0806  BP: 137/71  Pulse: 70  Resp: 17  Temp: 97.7 F (36.5 C)  SpO2: 95%     Filed Weights   11/20/22 0806  Weight: 128 lb 8 oz (58.3 kg)      GENERAL: well appearing female in NAD  SKIN: skin color, texture, turgor are normal. Diffuse, flat erythematous rash.  EYES: conjunctiva are pink and non-injected, sclera clear LYMPH:  no palpable lymphadenopathy in the cervical or supraclavicular lymph nodes.  LUNGS: clear to auscultation and percussion with normal breathing effort HEART: regular rate & rhythm and no murmurs and no lower extremity edema Musculoskeletal: no cyanosis of digits and no clubbing  PSYCH: alert & oriented x 3, fluent speech NEURO: no focal motor/sensory deficits  LABORATORY DATA:  I have reviewed the data as listed    Latest Ref Rng & Units 11/20/2022    7:41 AM 11/06/2022    1:43 PM 10/15/2022   10:43 AM  CBC  WBC 4.0 - 10.5 K/uL 8.2  6.8  8.5   Hemoglobin 12.0 - 15.0 g/dL 16.1  09.6   04.5   Hematocrit 36.0 - 46.0 % 42.5  41.8  42.0   Platelets 150 - 400 K/uL 326  340  340        Latest Ref Rng & Units 11/20/2022    7:41 AM 11/06/2022    1:43 PM 10/15/2022   10:43 AM  CMP  Glucose 70 - 99 mg/dL 409  95  811   BUN 8 - 23 mg/dL Creatinine 0.44 - 1.00 mg/dL 9.14  7.82  9.56   Sodium 135 - 145 mmol/L 139  137  138   Potassium 3.5 - 5.1 mmol/L 4.0  3.9  4.3   Chloride 98 - 111 mmol/L 104  101  102   CO2 22 - 32 mmol/L Calcium 8.9 - 10.3 mg/dL 9.2  9.2  9.1   Total Protein 6.5 - 8.1 g/dL 7.0  7.3  7.1   Total Bilirubin 0.3 - 1.2 mg/dL 0.6  0.5  0.5   Alkaline Phos 38 - 126 U/L 65  71  58   AST 15 - 41 U/L ALT 0 - 44 U/L ASSESSMENT & PLAN SARANN TREGRE is a 82 y.o.female who presents to the clinic for follow up for multiple myeloma. She was under the care of Dr. Clelia Croft and will transfer to Dr. Leonides Schanz moving forward.   #IgG Lambda Multiple Myeloma: --Initially presented in April 2016 with M spike of 3.2 g/dL and IgG level of 2130. Underwent bone marrow biopsy from 11/20/2014 show 29% plasma cells. 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  --In December 2019 with rising M spike of 5.9 g/dL and worsening anemia, with Hgb 8.7, she met criteria for transformation of active multiple myeloma.  --From 07/27/2018-09/2018, received weekly velcade plus dexamethasone. Therapy changed due to poor response.  --In 09/2018, started Revlimid 25 mg for 21 days on, 7 days off of a 28 day cycle with dexamethasone. Therapy was held after 2 cycles because of severe dermatological toxicity --  In 12/2018, switched to Pomalyst 1 mg daily for 21 days.  She completed 4 cycles of therapy and therapy was held due to occurrence of dermatological toxicity.  --In 07/2019, switch to Ninlaro 4 mg weekly with dexamethasone 20 mg weekly for 3 weeks on and 1 week off. Daratumumab was addd in April 2021. Clearnce Sorrel was discontinued in  November 2021. --In 05/18/2020, started monthly daratumumab and transitioned to q 3 month schedule in April 2022.  PLAN: --Labs from today were reviewed, M protein 0.9 on 10/15/2022.  --previously discussed that with the rising M-protein levels, there is concern for relapse.  --Bone survey on 11/03/2022 shows progression of disease with worsening lytic lesions of the skeleton. --After detailed discussion with the risks and benefits of continued treatment the patient was agreeable to proceeding with Kyprolis/Dex therapy.   --Labs today show white blood cell count 8.2, hemoglobin 14.2, MCV 94.4, and platelets of 326.  Creatinine 0.83 with normal LFTs. --today is Cycle 1 Day 1 of Kyprolis/Dex --RTC in 2 weeks for Cycle 1 Day 15 of Kyprolis/Dex with continued weekly treatments.   #Diffuse erythematous rash: --Managed with kenalog cream. -- nystatin powder for groin area.  --Discussed referral to dermatology, patient deferred at this time.   #Supportive Care -- port not required but can be placed if requested.  -- zofran 8mg  q8H PRN and compazine 10mg  PO q6H for nausea -- acyclovir 400mg  PO BID for VCZ prophylaxis -- no pain medication required at this time.    No orders of the defined types were placed in this encounter.  All questions were answered. The patient knows to call the clinic with any problems, questions or concerns.  I have spent a total of 30 minutes minutes of face-to-face and non-face-to-face time, preparing to see the patient,  performing a medically appropriate examination, counseling and educating the patient, ordering medications/tests/procedures,documenting clinical information in the electronic health record, and care coordination.   Ulysees Barns, MD Department of Hematology/Oncology [facility] Phone: [phone] Pager: [phone] Email: [email or web address]

## 2022-11-20 NOTE — Telephone Encounter (Signed)
-----   Message from Joella Prince, [place] sent at 11/20/2022 12:33 PM EDT ----- Regarding: First time Kyprolis. Pt of Dr Leonides Schanz Pt of Dr Leonides Schanz, received first time Kyprolis. Tolerated infusion well. No adverse SS. Vitals signs stable. Requesting follow up for first time, thanks!

## 2022-11-21 ENCOUNTER — Telehealth: Payer: Self-pay | Admitting: *Deleted

## 2022-11-21 NOTE — Telephone Encounter (Signed)
TCT patient to follow up with her after her first Kyprolis treatment. No answer but was able to leave vm message regarding how she tolerated her first kyprolis treatment. Advised to call back with any questions or concerns.

## 2022-11-23 ENCOUNTER — Other Ambulatory Visit: Payer: Self-pay

## 2022-11-24 ENCOUNTER — Telehealth: Payer: Self-pay | Admitting: Hematology and Oncology

## 2022-11-24 NOTE — Telephone Encounter (Signed)
Reached out to patient to schedule per WQ, left voicemail. 

## 2022-11-25 ENCOUNTER — Other Ambulatory Visit: Payer: Self-pay

## 2022-11-27 ENCOUNTER — Inpatient Hospital Stay: Payer: Medicare Other

## 2022-11-27 ENCOUNTER — Other Ambulatory Visit: Payer: Self-pay

## 2022-11-27 VITALS — BP 140/79 | HR 70 | Temp 98.7°F | Resp 17 | Wt 129.0 lb

## 2022-11-27 DIAGNOSIS — Z79899 Other long term (current) drug therapy: Secondary | ICD-10-CM | POA: Diagnosis not present

## 2022-11-27 DIAGNOSIS — Z5112 Encounter for antineoplastic immunotherapy: Secondary | ICD-10-CM | POA: Diagnosis not present

## 2022-11-27 DIAGNOSIS — C9000 Multiple myeloma not having achieved remission: Secondary | ICD-10-CM

## 2022-11-27 LAB — CMP (CANCER CENTER ONLY)
ALT: 19 U/L (ref 0–44)
AST: 20 U/L (ref 15–41)
Albumin: 4 g/dL (ref 3.5–5.0)
Alkaline Phosphatase: 59 U/L (ref 38–126)
Anion gap: 6 (ref 5–15)
BUN: 12 mg/dL (ref 8–23)
CO2: 28 mmol/L (ref 22–32)
Calcium: 9.7 mg/dL (ref 8.9–10.3)
Chloride: 105 mmol/L (ref 98–111)
Creatinine: 0.8 mg/dL (ref 0.44–1.00)
GFR, Estimated: >60 mL/min (ref >60)
Glucose, Bld: 82 mg/dL (ref 70–99)
Potassium: 4.2 mmol/L (ref 3.5–5.1)
Sodium: 139 mmol/L (ref 135–145)
Total Bilirubin: 0.5 mg/dL (ref 0.3–1.2)
Total Protein: 7 g/dL (ref 6.5–8.1)

## 2022-11-27 LAB — CBC WITH DIFFERENTIAL (CANCER CENTER ONLY)
Abs Immature Granulocytes: 0.02 10*3/uL (ref 0.00–0.07)
Basophils Absolute: 0 10*3/uL (ref 0.0–0.1)
Basophils Relative: 0 %
Eosinophils Absolute: 0.2 10*3/uL (ref 0.0–0.5)
Eosinophils Relative: 2 %
HCT: 42.9 % (ref 36.0–46.0)
Hemoglobin: 14.3 g/dL (ref 12.0–15.0)
Immature Granulocytes: 0 %
Lymphocytes Relative: 23 %
Lymphs Abs: 2.4 10*3/uL (ref 0.7–4.0)
MCH: 31.3 pg (ref 26.0–34.0)
MCHC: 33.3 g/dL (ref 30.0–36.0)
MCV: 93.9 fL (ref 80.0–100.0)
Monocytes Absolute: 0.8 10*3/uL (ref 0.1–1.0)
Monocytes Relative: 7 %
Neutro Abs: 7.2 10*3/uL (ref 1.7–7.7)
Neutrophils Relative %: 68 %
Platelet Count: 295 10*3/uL (ref 150–400)
RBC: 4.57 MIL/uL (ref 3.87–5.11)
RDW: 13.6 % (ref 11.5–15.5)
WBC Count: 10.6 10*3/uL — ABNORMAL HIGH (ref 4.0–10.5)
nRBC: 0 % (ref 0.0–0.2)

## 2022-11-27 MED ORDER — SODIUM CHLORIDE 0.9 % IV SOLN
40.0000 mg | Freq: Once | INTRAVENOUS | Status: AC
  Administered 2022-11-27: 40 mg via INTRAVENOUS
  Filled 2022-11-27: qty 4

## 2022-11-27 MED ORDER — DEXTROSE 5 % IV SOLN
70.0000 mg/m2 | Freq: Once | INTRAVENOUS | Status: AC
  Administered 2022-11-27: 120 mg via INTRAVENOUS
  Filled 2022-11-27: qty 60

## 2022-11-27 MED ORDER — SODIUM CHLORIDE 0.9 % IV SOLN: Freq: Once | INTRAVENOUS | Status: AC

## 2022-11-27 NOTE — Patient Instructions (Signed)
[facility]  Discharge Instructions: Thank you for choosing [facility] to provide your oncology and hematology care.   If you have a lab appointment with the Cancer Center, please go directly to the Cancer Center and check in at the registration area.   Wear comfortable clothing and clothing appropriate for easy access to any Portacath or PICC line.   We strive to give you quality time with your provider. You may need to reschedule your appointment if you arrive late (15 or more minutes).  Arriving late affects you and other patients whose appointments are after yours.  Also, if you miss three or more appointments without notifying the office, you may be dismissed from the clinic at the provider's discretion.      For prescription refill requests, have your pharmacy contact our office and allow 72 hours for refills to be completed.    Today you received the following chemotherapy and/or immunotherapy agents :  Kyprolis   To help prevent nausea and vomiting after your treatment, we encourage you to take your nausea medication as directed.  BELOW ARE SYMPTOMS THAT SHOULD BE REPORTED IMMEDIATELY: *FEVER GREATER THAN 100.4 F (38 C) OR HIGHER *CHILLS OR SWEATING *NAUSEA AND VOMITING THAT IS NOT CONTROLLED WITH YOUR NAUSEA MEDICATION *UNUSUAL SHORTNESS OF BREATH *UNUSUAL BRUISING OR BLEEDING *URINARY PROBLEMS (pain or burning when urinating, or frequent urination) *BOWEL PROBLEMS (unusual diarrhea, constipation, pain near the anus) TENDERNESS IN MOUTH AND THROAT WITH OR WITHOUT PRESENCE OF ULCERS (sore throat, sores in mouth, or a toothache) UNUSUAL RASH, SWELLING OR PAIN  UNUSUAL VAGINAL DISCHARGE OR ITCHING   Items with * indicate a potential emergency and should be followed up as soon as possible or go to the Emergency Department if any problems should occur.  Please show the CHEMOTHERAPY ALERT CARD or IMMUNOTHERAPY ALERT CARD at  check-in to the Emergency Department and triage nurse.  Should you have questions after your visit or need to cancel or reschedule your appointment, please contact [facility]  Dept: [phone]  and follow the prompts.  Office hours are 8:00 a.m. to 4:30 p.m. Monday - Friday. Please note that voicemails left after 4:00 p.m. may not be returned until the following business day.  We are closed weekends and major holidays. You have access to a nurse at all times for urgent questions. Please call the main number to the clinic Dept: [phone] and follow the prompts.   For any non-urgent questions, you may also contact your provider using MyChart. We now offer e-Visits for anyone 18 and older to request care online for non-urgent symptoms. For details visit [email or web address].   Also download the MyChart app! Go to the app store, search "MyChart", open the app, select [facility], and log in with your MyChart username and password.   

## 2022-12-01 ENCOUNTER — Telehealth: Payer: Self-pay | Admitting: Hematology and Oncology

## 2022-12-01 NOTE — Telephone Encounter (Signed)
Reached out to patient to schedule; left voicemail. 

## 2022-12-02 ENCOUNTER — Other Ambulatory Visit: Payer: Self-pay

## 2022-12-03 ENCOUNTER — Other Ambulatory Visit: Payer: Self-pay

## 2022-12-05 ENCOUNTER — Inpatient Hospital Stay: Payer: Medicare Other | Admitting: Physician Assistant

## 2022-12-05 ENCOUNTER — Inpatient Hospital Stay: Payer: Medicare Other

## 2022-12-05 ENCOUNTER — Other Ambulatory Visit: Payer: Self-pay

## 2022-12-05 VITALS — BP 136/85 | HR 71 | Temp 98.5°F | Resp 18 | Wt 130.7 lb

## 2022-12-05 DIAGNOSIS — Z79899 Other long term (current) drug therapy: Secondary | ICD-10-CM | POA: Diagnosis not present

## 2022-12-05 DIAGNOSIS — C9000 Multiple myeloma not having achieved remission: Secondary | ICD-10-CM

## 2022-12-05 DIAGNOSIS — Z5112 Encounter for antineoplastic immunotherapy: Secondary | ICD-10-CM | POA: Diagnosis not present

## 2022-12-05 LAB — CBC WITH DIFFERENTIAL (CANCER CENTER ONLY)
Abs Immature Granulocytes: 0.03 10*3/uL (ref 0.00–0.07)
Basophils Absolute: 0 10*3/uL (ref 0.0–0.1)
Basophils Relative: 0 %
Eosinophils Absolute: 0.2 10*3/uL (ref 0.0–0.5)
Eosinophils Relative: 2 %
HCT: 40 % (ref 36.0–46.0)
Hemoglobin: 13.5 g/dL (ref 12.0–15.0)
Immature Granulocytes: 0 %
Lymphocytes Relative: 22 %
Lymphs Abs: 1.7 10*3/uL (ref 0.7–4.0)
MCH: 31.5 pg (ref 26.0–34.0)
MCHC: 33.8 g/dL (ref 30.0–36.0)
MCV: 93.2 fL (ref 80.0–100.0)
Monocytes Absolute: 0.7 10*3/uL (ref 0.1–1.0)
Monocytes Relative: 9 %
Neutro Abs: 5.1 10*3/uL (ref 1.7–7.7)
Neutrophils Relative %: 67 %
Platelet Count: 276 10*3/uL (ref 150–400)
RBC: 4.29 MIL/uL (ref 3.87–5.11)
RDW: 13.7 % (ref 11.5–15.5)
WBC Count: 7.7 10*3/uL (ref 4.0–10.5)
nRBC: 0 % (ref 0.0–0.2)

## 2022-12-05 LAB — CMP (CANCER CENTER ONLY)
ALT: 18 U/L (ref 0–44)
AST: 19 U/L (ref 15–41)
Albumin: 3.8 g/dL (ref 3.5–5.0)
Alkaline Phosphatase: 54 U/L (ref 38–126)
Anion gap: 5 (ref 5–15)
BUN: 10 mg/dL (ref 8–23)
CO2: 28 mmol/L (ref 22–32)
Calcium: 9.2 mg/dL (ref 8.9–10.3)
Chloride: 104 mmol/L (ref 98–111)
Creatinine: 0.75 mg/dL (ref 0.44–1.00)
GFR, Estimated: >60 mL/min (ref >60)
Glucose, Bld: 88 mg/dL (ref 70–99)
Potassium: 4.1 mmol/L (ref 3.5–5.1)
Sodium: 137 mmol/L (ref 135–145)
Total Bilirubin: 0.5 mg/dL (ref 0.3–1.2)
Total Protein: 6.3 g/dL — ABNORMAL LOW (ref 6.5–8.1)

## 2022-12-05 MED ORDER — SODIUM CHLORIDE 0.9 % IV SOLN
40.0000 mg | Freq: Once | INTRAVENOUS | Status: AC
  Administered 2022-12-05: 40 mg via INTRAVENOUS
  Filled 2022-12-05: qty 4

## 2022-12-05 MED ORDER — DEXTROSE 5 % IV SOLN
70.0000 mg/m2 | Freq: Once | INTRAVENOUS | Status: AC
  Administered 2022-12-05: 120 mg via INTRAVENOUS
  Filled 2022-12-05: qty 60

## 2022-12-05 MED ORDER — SODIUM CHLORIDE 0.9 % IV SOLN: Freq: Once | INTRAVENOUS | Status: AC

## 2022-12-05 NOTE — Patient Instructions (Signed)
[facility]  Discharge Instructions: Thank you for choosing [facility] to provide your oncology and hematology care.   If you have a lab appointment with the Cancer Center, please go directly to the Cancer Center and check in at the registration area.   Wear comfortable clothing and clothing appropriate for easy access to any Portacath or PICC line.   We strive to give you quality time with your provider. You may need to reschedule your appointment if you arrive late (15 or more minutes).  Arriving late affects you and other patients whose appointments are after yours.  Also, if you miss three or more appointments without notifying the office, you may be dismissed from the clinic at the provider's discretion.      For prescription refill requests, have your pharmacy contact our office and allow 72 hours for refills to be completed.    Today you received the following chemotherapy and/or immunotherapy agents :  Kyprolis   To help prevent nausea and vomiting after your treatment, we encourage you to take your nausea medication as directed.  BELOW ARE SYMPTOMS THAT SHOULD BE REPORTED IMMEDIATELY: *FEVER GREATER THAN 100.4 F (38 C) OR HIGHER *CHILLS OR SWEATING *NAUSEA AND VOMITING THAT IS NOT CONTROLLED WITH YOUR NAUSEA MEDICATION *UNUSUAL SHORTNESS OF BREATH *UNUSUAL BRUISING OR BLEEDING *URINARY PROBLEMS (pain or burning when urinating, or frequent urination) *BOWEL PROBLEMS (unusual diarrhea, constipation, pain near the anus) TENDERNESS IN MOUTH AND THROAT WITH OR WITHOUT PRESENCE OF ULCERS (sore throat, sores in mouth, or a toothache) UNUSUAL RASH, SWELLING OR PAIN  UNUSUAL VAGINAL DISCHARGE OR ITCHING   Items with * indicate a potential emergency and should be followed up as soon as possible or go to the Emergency Department if any problems should occur.  Please show the CHEMOTHERAPY ALERT CARD or IMMUNOTHERAPY ALERT CARD at  check-in to the Emergency Department and triage nurse.  Should you have questions after your visit or need to cancel or reschedule your appointment, please contact [facility]  Dept: [phone]  and follow the prompts.  Office hours are 8:00 a.m. to 4:30 p.m. Monday - Friday. Please note that voicemails left after 4:00 p.m. may not be returned until the following business day.  We are closed weekends and major holidays. You have access to a nurse at all times for urgent questions. Please call the main number to the clinic Dept: [phone] and follow the prompts.   For any non-urgent questions, you may also contact your provider using MyChart. We now offer e-Visits for anyone 18 and older to request care online for non-urgent symptoms. For details visit [email or web address].   Also download the MyChart app! Go to the app store, search "MyChart", open the app, select , and log in with your MyChart username and password.   

## 2022-12-08 ENCOUNTER — Encounter: Payer: Self-pay | Admitting: Hematology and Oncology

## 2022-12-08 NOTE — Progress Notes (Signed)
[facility] [phone]   [phone]  PROGRESS NOTE  Patient Care Team: Farris Has, MD as PCP - General (Family Medicine)  CHIEF COMPLAINTS/PURPOSE OF CONSULTATION:  IgG Lambda Multiple Myeloma  ONCOLOGIC HISTORY: 07/27/2018-09/2018: Received weekly velcade plus dexamethasone. Therapy changed due to poor response.  09/2018: Started Revlimid 25 mg for 21 days on, 7 days off of a 28 day cycle with dexamethasone. Therapy was held after 2 cycles because of severe dermatological toxicity 12/2018: Pomalyst 1 mg daily for 21 days.  She completed 4 cycles of therapy and therapy was held due to occurrence of dermatological toxicity.  07/2019: Started Ninlaro 4 mg weekly with dexamethasone 20 mg weekly for 3 weeks on and 1 week off. Daratumumab was addd in April 2021. Clearnce Sorrel was discontinued in November 2021. 05/18/2020: Started monthly daratumumab and transitioned to q 3 month schedule in April 2022.  11/06/2022: Darzalex discontinued due to progression of disease. M protein 0.9 with worsening lytic lesions in the skeleton.  11/20/2022: Cycle 1 Day 1 of Kyprolis/Dex therapy.   HISTORY OF PRESENTING ILLNESS:  Nicole Keith 82 y.o. female returns for a follow up for IgG lambda multiple myeloma. She was last seen on 11/20/2022. She presents today for Cycle 1, Day 15 of Kyprolis/Dex.   On exam today, Nicole Keith reports she is doing well on the new therapy. Her energy levels have improved and she is trying to walk 1 mile per day. She still has a rash but that has also improved and is managed with topical kenalog cream. She has a good appetite and denies any recent weight changes. She denies nausea, vomiting or abdominal pain. She experienced once episode of diarrhea which resolved with imodium. She denies easy bruising or signs of active bleeding. Overall she is willing and able to proceed with treatment today.. She denies fevers, chills, sweats, shortness of breath,  chest pain or cough. She has no other complaints. Rest of the 10 point ROS is below.  MEDICAL HISTORY:  Past Medical History:  Diagnosis Date   Allergic rhinitis    Arthritis    Colitis, collagenous    Diverticular disease    severe sig tics/fixation 2012   Family hx of colon cancer    Fracture of femoral neck, right (HCC) 01/09/2013   GERD (gastroesophageal reflux disease)    exacerbation of reflux-like symptoms 04/2011   Hiatal hernia    Hyperlipidemia    Osteoarthritis of right hip 01/10/2013    SURGICAL HISTORY: Past Surgical History:  Procedure Laterality Date   CHOLECYSTECTOMY     EYE SURGERY  feb 2016   cataract right eye   LEG SURGERY Left    TONSILLECTOMY     TOTAL HIP ARTHROPLASTY Right 01/10/2013   Procedure: TOTAL HIP ARTHROPLASTY;  Surgeon: Eulas Post, MD;  Location: [facility] OR;  Service: Orthopedics;  Laterality: Right;    SOCIAL HISTORY: Social History   Socioeconomic History   Marital status: Married    Spouse name: Not on file   Number of children: 0   Years of education: Not on file   Highest education level: Some college, no degree  Occupational History   Occupation: retired  Tobacco Use   Smoking status: Former    Types: Cigarettes    Quit date: 01/10/1972    Years since quitting: 50.9   Smokeless tobacco: Never   Tobacco comments:    quit 1973  Vaping Use   Vaping Use: Never used  Substance and Sexual  Activity   Alcohol use: No   Drug use: No   Sexual activity: Not on file  Other Topics Concern   Not on file  Social History Narrative   Married, no children. Drinks 2 cups decaf coffee a day. Walks daily. Is active, plays golf, she and her husband go hiking frequently. 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   Social Determinants of Health   Financial Resource Strain: Not on file  Food Insecurity: Not on file  Transportation Needs: Not on file  Physical Activity: Not on file  Stress: Not on file  Social Connections: Not on  file  Intimate Partner Violence: Not on file    FAMILY HISTORY: Family History  Problem Relation Age of Onset   Congestive Heart Failure Mother    Arthritis-Osteo Mother    Colon cancer Mother    Heart attack Father    Rheum arthritis Father    Colon cancer Maternal Aunt    Colon cancer Maternal Aunt    Hypothyroidism Sister     ALLERGIES:  is allergic to pomalyst [pomalidomide], revlimid [lenalidomide], betadine [povidone iodine], flagyl [metronidazole], fosamax [alendronate sodium], bactrim [sulfamethoxazole-trimethoprim], clindamycin/lincomycin, doxepin hcl, hydrocodone-acetaminophen, hydroxyzine, hydroxyzine hcl, tramadol hcl, valium [diazepam], actonel [risedronate sodium], boniva [ibandronic acid], doxycycline, and penicillins.  MEDICATIONS:  Current Outpatient Medications  Medication Sig Dispense Refill   acetaminophen (TYLENOL) 500 MG tablet Take 500 mg by mouth in the morning, at noon, and at bedtime.      diphenhydramine-acetaminophen (TYLENOL PM) 25-500 MG TABS tablet Take 2 tablets by mouth at bedtime as needed (for sleep).      famotidine-calcium carbonate-magnesium hydroxide (PEPCID COMPLETE) 10-800-165 MG chewable tablet Chew 1 tablet by mouth daily as needed (for heartburn or indigestion).      ibuprofen (ADVIL) 400 MG tablet Take 400 mg by mouth every 6 (six) hours.     triamcinolone cream (KENALOG) 0.1 % Apply 1 Application topically 2 (two) times daily. 453.6 g 2   No current facility-administered medications for this visit.    REVIEW OF SYSTEMS:   Constitutional: ( - ) fevers, ( - )  chills , ( - ) night sweats Eyes: ( - ) blurriness of vision, ( - ) double vision, ( - ) watery eyes Ears, nose, mouth, throat, and face: ( - ) mucositis, ( - ) sore throat Respiratory: ( - ) cough, ( - ) dyspnea, ( - ) wheezes Cardiovascular: ( - ) palpitation, ( - ) chest discomfort, ( - ) lower extremity swelling Gastrointestinal:  ( - ) nausea, ( - ) heartburn, ( - ) change in  bowel habits Skin: ( +) abnormal skin rashes Lymphatics: ( - ) new lymphadenopathy, ( - ) easy bruising Neurological: ( - ) numbness, ( - ) tingling, ( - ) new weaknesses Behavioral/Psych: ( - ) mood change, ( - ) new changes  All other systems were reviewed with the patient and are negative.  PHYSICAL EXAMINATION: ECOG PERFORMANCE STATUS: 1 - Symptomatic but completely ambulatory  Vitals:   12/05/22 1255  BP: 136/85  Pulse: 71  Resp: 18  Temp: 98.5 F (36.9 C)  SpO2: 97%     Filed Weights   12/05/22 1255  Weight: 130 lb 11.2 oz (59.3 kg)      GENERAL: well appearing female in NAD  SKIN: skin color, texture, turgor are normal. Diffuse, flat erythematous rash.  EYES: conjunctiva are pink and non-injected, sclera clear LYMPH:  no palpable lymphadenopathy in the cervical or supraclavicular  lymph nodes.  LUNGS: clear to auscultation and percussion with normal breathing effort HEART: regular rate & rhythm and no murmurs and no lower extremity edema Musculoskeletal: no cyanosis of digits and no clubbing  PSYCH: alert & oriented x 3, fluent speech NEURO: no focal motor/sensory deficits  LABORATORY DATA:  I have reviewed the data as listed    Latest Ref Rng & Units 12/05/2022   12:34 PM 11/27/2022   12:49 PM 11/20/2022    7:41 AM  CBC  WBC 4.0 - 10.5 K/uL 7.7  10.6  8.2   Hemoglobin 12.0 - 15.0 g/dL 16.1  09.6  04.5   Hematocrit 36.0 - 46.0 % 40.0  42.9  42.5   Platelets 150 - 400 K/uL 276  295  326        Latest Ref Rng & Units 12/05/2022   12:34 PM 11/27/2022   12:49 PM 11/20/2022    7:41 AM  CMP  Glucose 70 - 99 mg/dL 88  82  409   BUN 8 - 23 mg/dL 10  12  13    Creatinine 0.44 - 1.00 mg/dL 8.11  9.14  7.82   Sodium 135 - 145 mmol/L 137  139  139   Potassium 3.5 - 5.1 mmol/L 4.1  4.2  4.0   Chloride 98 - 111 mmol/L 104  105  104   CO2 22 - 32 mmol/L 28  28  29    Calcium 8.9 - 10.3 mg/dL 9.2  9.7  9.2   Total Protein 6.5 - 8.1 g/dL 6.3  7.0  7.0   Total Bilirubin  0.3 - 1.2 mg/dL 0.5  0.5  0.6   Alkaline Phos 38 - 126 U/L 54  59  65   AST 15 - 41 U/L 19  20  21    ALT 0 - 44 U/L 18  19  19      ASSESSMENT & PLAN MIOSOTIS WETSEL is a 82 y.o.female who presents to the clinic for follow up for multiple myeloma. She was under the care of Dr. Clelia Croft and will transfer to Dr. Leonides Schanz moving forward.   #IgG Lambda Multiple Myeloma: --Initially presented in April 2016 with M spike of 3.2 g/dL and IgG level of 9562. Underwent bone marrow biopsy from 11/20/2014 show 29% plasma cells. 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  --In December 2019 with rising M spike of 5.9 g/dL and worsening anemia, with Hgb 8.7, she met criteria for transformation of active multiple myeloma.  --From 07/27/2018-09/2018, received weekly velcade plus dexamethasone. Therapy changed due to poor response.  --In 09/2018, started Revlimid 25 mg for 21 days on, 7 days off of a 28 day cycle with dexamethasone. Therapy was held after 2 cycles because of severe dermatological toxicity --In 12/2018, switched to Pomalyst 1 mg daily for 21 days.  She completed 4 cycles of therapy and therapy was held due to occurrence of dermatological toxicity.  --In 07/2019, switch to Ninlaro 4 mg weekly with dexamethasone 20 mg weekly for 3 weeks on and 1 week off. Daratumumab was addd in April 2021. Clearnce Sorrel was discontinued in November 2021. --On 05/18/2020, started monthly daratumumab and transitioned to q 3 month schedule in April 2022.  --On 11/20/2022, switch to Kyprolis/Dex due to rising M-protein and bone survey that showed worsening lytic lesions.  PLAN:   --Labs today show white blood cell count 7.7, hemoglobin 13.5, MCV 93.2, and platelets of 276.  Creatinine 0.75 with normal LFTs. --Due Cycle 1 Day 15 of  Kyprolis/Dex. Proceed without any dose modifications.  --RTC in 2 weeks for Cycle 2 Day 1 of Kyprolis/Dex with continued weekly treatments.   #Diffuse erythematous rash: --Managed with  kenalog cream. -- nystatin powder for groin area.  --Discussed referral to dermatology, patient deferred at this time.   #Supportive Care -- port not required but can be placed if requested.  -- zofran 8mg  q8H PRN and compazine 10mg  PO q6H for nausea -- acyclovir 400mg  PO BID for VCZ prophylaxis -- no pain medication required at this time.    No orders of the defined types were placed in this encounter.  All questions were answered. The patient knows to call the clinic with any problems, questions or concerns.  I have spent a total of 30 minutes minutes of face-to-face and non-face-to-face time, preparing to see the patient,  performing a medically appropriate examination, counseling and educating the patient, ordering medications/tests/procedures,documenting clinical information in the electronic health record, and care coordination.   Georga Kaufmann PA-C Dept of Hematology and Oncology [facility] Phone: [phone]

## 2022-12-19 ENCOUNTER — Inpatient Hospital Stay: Payer: Medicare Other | Admitting: Physician Assistant

## 2022-12-19 ENCOUNTER — Inpatient Hospital Stay: Payer: Medicare Other

## 2022-12-19 ENCOUNTER — Other Ambulatory Visit: Payer: Self-pay

## 2022-12-19 ENCOUNTER — Inpatient Hospital Stay: Payer: Medicare Other | Attending: Physician Assistant

## 2022-12-19 VITALS — BP 155/84 | HR 71

## 2022-12-19 DIAGNOSIS — Z79899 Other long term (current) drug therapy: Secondary | ICD-10-CM | POA: Diagnosis not present

## 2022-12-19 DIAGNOSIS — C9000 Multiple myeloma not having achieved remission: Secondary | ICD-10-CM

## 2022-12-19 DIAGNOSIS — Z5112 Encounter for antineoplastic immunotherapy: Secondary | ICD-10-CM | POA: Insufficient documentation

## 2022-12-19 LAB — CBC WITH DIFFERENTIAL (CANCER CENTER ONLY)
Abs Immature Granulocytes: 0.03 10*3/uL (ref 0.00–0.07)
Basophils Absolute: 0.1 10*3/uL (ref 0.0–0.1)
Basophils Relative: 1 %
Eosinophils Absolute: 0.1 10*3/uL (ref 0.0–0.5)
Eosinophils Relative: 2 %
HCT: 40.8 % (ref 36.0–46.0)
Hemoglobin: 13.7 g/dL (ref 12.0–15.0)
Immature Granulocytes: 0 %
Lymphocytes Relative: 19 %
Lymphs Abs: 1.6 10*3/uL (ref 0.7–4.0)
MCH: 31.7 pg (ref 26.0–34.0)
MCHC: 33.6 g/dL (ref 30.0–36.0)
MCV: 94.4 fL (ref 80.0–100.0)
Monocytes Absolute: 0.6 10*3/uL (ref 0.1–1.0)
Monocytes Relative: 7 %
Neutro Abs: 6 10*3/uL (ref 1.7–7.7)
Neutrophils Relative %: 71 %
Platelet Count: 462 10*3/uL — ABNORMAL HIGH (ref 150–400)
RBC: 4.32 MIL/uL (ref 3.87–5.11)
RDW: 13.8 % (ref 11.5–15.5)
WBC Count: 8.4 10*3/uL (ref 4.0–10.5)
nRBC: 0 % (ref 0.0–0.2)

## 2022-12-19 LAB — CMP (CANCER CENTER ONLY)
ALT: 18 U/L (ref 0–44)
AST: 21 U/L (ref 15–41)
Albumin: 3.8 g/dL (ref 3.5–5.0)
Alkaline Phosphatase: 57 U/L (ref 38–126)
Anion gap: 4 — ABNORMAL LOW (ref 5–15)
BUN: 11 mg/dL (ref 8–23)
CO2: 29 mmol/L (ref 22–32)
Calcium: 8.6 mg/dL — ABNORMAL LOW (ref 8.9–10.3)
Chloride: 106 mmol/L (ref 98–111)
Creatinine: 0.71 mg/dL (ref 0.44–1.00)
GFR, Estimated: >60 mL/min (ref >60)
Glucose, Bld: 107 mg/dL — ABNORMAL HIGH (ref 70–99)
Potassium: 4.2 mmol/L (ref 3.5–5.1)
Sodium: 139 mmol/L (ref 135–145)
Total Bilirubin: 0.7 mg/dL (ref 0.3–1.2)
Total Protein: 6.6 g/dL (ref 6.5–8.1)

## 2022-12-19 MED ORDER — SODIUM CHLORIDE 0.9 % IV SOLN
40.0000 mg | Freq: Once | INTRAVENOUS | Status: AC
  Administered 2022-12-19: 40 mg via INTRAVENOUS
  Filled 2022-12-19: qty 4

## 2022-12-19 MED ORDER — DEXTROSE 5 % IV SOLN
70.0000 mg/m2 | Freq: Once | INTRAVENOUS | Status: AC
  Administered 2022-12-19: 120 mg via INTRAVENOUS
  Filled 2022-12-19: qty 60

## 2022-12-19 MED ORDER — SODIUM CHLORIDE 0.9 % IV SOLN: Freq: Once | INTRAVENOUS | Status: AC

## 2022-12-19 NOTE — Progress Notes (Signed)
[facility] [phone]   [phone]  PROGRESS NOTE  Patient Care Team: Farris Has, MD as PCP - General (Family Medicine)  CHIEF COMPLAINTS/PURPOSE OF CONSULTATION:  IgG Lambda Multiple Myeloma  ONCOLOGIC HISTORY: 07/27/2018-09/2018: Received weekly velcade plus dexamethasone. Therapy changed due to poor response.  09/2018: Started Revlimid 25 mg for 21 days on, 7 days off of a 28 day cycle with dexamethasone. Therapy was held after 2 cycles because of severe dermatological toxicity 12/2018: Pomalyst 1 mg daily for 21 days.  She completed 4 cycles of therapy and therapy was held due to occurrence of dermatological toxicity.  07/2019: Started Ninlaro 4 mg weekly with dexamethasone 20 mg weekly for 3 weeks on and 1 week off. Daratumumab was addd in April 2021. Clearnce Sorrel was discontinued in November 2021. 05/18/2020: Started monthly daratumumab and transitioned to q 3 month schedule in April 2022.  11/06/2022: Darzalex discontinued due to progression of disease. M protein 0.9 with worsening lytic lesions in the skeleton.  11/20/2022: Cycle 1 Day 1 of Kyprolis/Dex therapy.  12/19/2022: Cycle 2 ay 1 of Kyprolis/Dex therapy  HISTORY OF PRESENTING ILLNESS:  Nicole Keith 82 y.o. female returns for a follow up for IgG lambda multiple myeloma. She was last seen on 12/05/2022. She presents today for Cycle 2, Day 1 of Kyprolis/Dex.   On exam today, Nicole Keith reports she is doing well on the new therapy. Her energy levels are stable. She has a good appetite and denies any weight changes. She denies nausea, vomiting or abdominal pain. Her bowel habits are unchanged without recurrent episodes of diarrhea or constipation. She denies easy bruising or signs of active bleeding. She developed some bruising in her right forearm due to difficulty with IV access during her last treatment. Overall she is willing and able to proceed with treatment today. She denies fevers,  chills, sweats, shortness of breath, chest pain or cough. She has no other complaints. Rest of the 10 point ROS is below.  MEDICAL HISTORY:  Past Medical History:  Diagnosis Date   Allergic rhinitis    Arthritis    Colitis, collagenous    Diverticular disease    severe sig tics/fixation 2012   Family hx of colon cancer    Fracture of femoral neck, right (HCC) 01/09/2013   GERD (gastroesophageal reflux disease)    exacerbation of reflux-like symptoms 04/2011   Hiatal hernia    Hyperlipidemia    Osteoarthritis of right hip 01/10/2013    SURGICAL HISTORY: Past Surgical History:  Procedure Laterality Date   CHOLECYSTECTOMY     EYE SURGERY  feb 2016   cataract right eye   LEG SURGERY Left    TONSILLECTOMY     TOTAL HIP ARTHROPLASTY Right 01/10/2013   Procedure: TOTAL HIP ARTHROPLASTY;  Surgeon: Eulas Post, MD;  Location: [facility] OR;  Service: Orthopedics;  Laterality: Right;    SOCIAL HISTORY: Social History   Socioeconomic History   Marital status: Married    Spouse name: Not on file   Number of children: 0   Years of education: Not on file   Highest education level: Some college, no degree  Occupational History   Occupation: retired  Tobacco Use   Smoking status: Former    Types: Cigarettes    Quit date: 01/10/1972    Years since quitting: 50.9   Smokeless tobacco: Never   Tobacco comments:    quit 1973  Vaping Use   Vaping Use: Never used  Substance and Sexual  Activity   Alcohol use: No   Drug use: No   Sexual activity: Not on file  Other Topics Concern   Not on file  Social History Narrative   Married, no children. Drinks 2 cups decaf coffee a day. Walks daily. Is active, plays golf, she and her husband go hiking frequently. 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   Social Determinants of Health   Financial Resource Strain: Not on file  Food Insecurity: Not on file  Transportation Needs: Not on file  Physical Activity: Not on file  Stress: Not  on file  Social Connections: Not on file  Intimate Partner Violence: Not on file    FAMILY HISTORY: Family History  Problem Relation Age of Onset   Congestive Heart Failure Mother    Arthritis-Osteo Mother    Colon cancer Mother    Heart attack Father    Rheum arthritis Father    Colon cancer Maternal Aunt    Colon cancer Maternal Aunt    Hypothyroidism Sister     ALLERGIES:  is allergic to pomalyst [pomalidomide], revlimid [lenalidomide], betadine [povidone iodine], flagyl [metronidazole], fosamax [alendronate sodium], bactrim [sulfamethoxazole-trimethoprim], clindamycin/lincomycin, doxepin hcl, hydrocodone-acetaminophen, hydroxyzine, hydroxyzine hcl, tramadol hcl, valium [diazepam], actonel [risedronate sodium], boniva [ibandronic acid], doxycycline, and penicillins.  MEDICATIONS:  Current Outpatient Medications  Medication Sig Dispense Refill   acetaminophen (TYLENOL) 500 MG tablet Take 500 mg by mouth in the morning, at noon, and at bedtime.      diphenhydramine-acetaminophen (TYLENOL PM) 25-500 MG TABS tablet Take 2 tablets by mouth at bedtime as needed (for sleep).      famotidine-calcium carbonate-magnesium hydroxide (PEPCID COMPLETE) 10-800-165 MG chewable tablet Chew 1 tablet by mouth daily as needed (for heartburn or indigestion).      ibuprofen (ADVIL) 400 MG tablet Take 400 mg by mouth every 6 (six) hours.     triamcinolone cream (KENALOG) 0.1 % Apply 1 Application topically 2 (two) times daily. 453.6 g 2   No current facility-administered medications for this visit.   Facility-Administered Medications Ordered in Other Visits  Medication Dose Route Frequency Provider Last Rate Last Admin   carfilzomib (KYPROLIS) 120 mg in dextrose 5 % 100 mL chemo infusion  70 mg/m2 (Treatment Plan Recorded) Intravenous Once Jaci Standard, MD 320 mL/hr at 12/19/22 1219 120 mg at 12/19/22 1219    REVIEW OF SYSTEMS:   Constitutional: ( - ) fevers, ( - )  chills , ( - ) night  sweats Eyes: ( - ) blurriness of vision, ( - ) double vision, ( - ) watery eyes Ears, nose, mouth, throat, and face: ( - ) mucositis, ( - ) sore throat Respiratory: ( - ) cough, ( - ) dyspnea, ( - ) wheezes Cardiovascular: ( - ) palpitation, ( - ) chest discomfort, ( - ) lower extremity swelling Gastrointestinal:  ( - ) nausea, ( - ) heartburn, ( - ) change in bowel habits Skin: ( +) abnormal skin rashes Lymphatics: ( - ) new lymphadenopathy, ( - ) easy bruising Neurological: ( - ) numbness, ( - ) tingling, ( - ) new weaknesses Behavioral/Psych: ( - ) mood change, ( - ) new changes  All other systems were reviewed with the patient and are negative.  PHYSICAL EXAMINATION: ECOG PERFORMANCE STATUS: 1 - Symptomatic but completely ambulatory  Vitals:   12/19/22 0923  BP: 139/73  Pulse: 65  Resp: 14  Temp: 97.7 F (36.5 C)  SpO2: 98%     Filed  Weights   12/19/22 0923  Weight: 129 lb 3.2 oz (58.6 kg)      GENERAL: well appearing female in NAD  SKIN: skin color, texture, turgor are normal. Diffuse, flat erythematous rash.  EYES: conjunctiva are pink and non-injected, sclera clear LYMPH:  no palpable lymphadenopathy in the cervical or supraclavicular lymph nodes.  LUNGS: clear to auscultation and percussion with normal breathing effort HEART: regular rate & rhythm and no murmurs and no lower extremity edema Musculoskeletal: no cyanosis of digits and no clubbing  PSYCH: alert & oriented x 3, fluent speech NEURO: no focal motor/sensory deficits  LABORATORY DATA:  I have reviewed the data as listed    Latest Ref Rng & Units 12/19/2022    8:45 AM 12/05/2022   12:34 PM 11/27/2022   12:49 PM  CBC  WBC 4.0 - 10.5 K/uL 8.4  7.7  10.6   Hemoglobin 12.0 - 15.0 g/dL 16.1  09.6  04.5   Hematocrit 36.0 - 46.0 % 40.8  40.0  42.9   Platelets 150 - 400 K/uL 462  276  295        Latest Ref Rng & Units 12/19/2022    8:45 AM 12/05/2022   12:34 PM 11/27/2022   12:49 PM  CMP  Glucose 70 -  99 mg/dL 409  88  82   BUN 8 - 23 mg/dL 11  10  12    Creatinine 0.44 - 1.00 mg/dL 8.11  9.14  7.82   Sodium 135 - 145 mmol/L 139  137  139   Potassium 3.5 - 5.1 mmol/L 4.2  4.1  4.2   Chloride 98 - 111 mmol/L 106  104  105   CO2 22 - 32 mmol/L 29  28  28    Calcium 8.9 - 10.3 mg/dL 8.6  9.2  9.7   Total Protein 6.5 - 8.1 g/dL 6.6  6.3  7.0   Total Bilirubin 0.3 - 1.2 mg/dL 0.7  0.5  0.5   Alkaline Phos 38 - 126 U/L 57  54  59   AST 15 - 41 U/L 21  19  20    ALT 0 - 44 U/L 18  18  19      ASSESSMENT & PLAN Nicole Keith is a 82 y.o.female who presents to the clinic for follow up for multiple myeloma. She was under the care of Dr. Clelia Croft and will transfer to Dr. Leonides Schanz moving forward.   #IgG Lambda Multiple Myeloma: --Initially presented in April 2016 with M spike of 3.2 g/dL and IgG level of 9562. Underwent bone marrow biopsy from 11/20/2014 show 29% plasma cells. 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  --In December 2019 with rising M spike of 5.9 g/dL and worsening anemia, with Hgb 8.7, she met criteria for transformation of active multiple myeloma.  --From 07/27/2018-09/2018, received weekly velcade plus dexamethasone. Therapy changed due to poor response.  --In 09/2018, started Revlimid 25 mg for 21 days on, 7 days off of a 28 day cycle with dexamethasone. Therapy was held after 2 cycles because of severe dermatological toxicity --In 12/2018, switched to Pomalyst 1 mg daily for 21 days.  She completed 4 cycles of therapy and therapy was held due to occurrence of dermatological toxicity.  --In 07/2019, switch to Ninlaro 4 mg weekly with dexamethasone 20 mg weekly for 3 weeks on and 1 week off. Daratumumab was addd in April 2021. Clearnce Sorrel was discontinued in November 2021. --On 05/18/2020, started monthly daratumumab and transitioned to q 3  month schedule in April 2022.  --On 11/20/2022, switch to Kyprolis/Dex due to rising M-protein and bone survey that showed worsening  lytic lesions.  PLAN:   --Labs today show white blood cell count 8.4, hemoglobin 13.7, MCV 94.4, and platelets of 462.  Creatinine 0.71 with normal LFTs. --Due Cycle 2 Day 1 of Kyprolis/Dex. Proceed without any dose modifications.  --RTC in 2 weeks for Cycle 2 Day 15 of Kyprolis/Dex with continued weekly treatments.   #Diffuse erythematous rash: --Managed with kenalog cream. -- nystatin powder for groin area.  --Discussed referral to dermatology, patient deferred at this time.   #Supportive Care -- port not required but can be placed if requested.  -- zofran 8mg  q8H PRN and compazine 10mg  PO q6H for nausea -- acyclovir 400mg  PO BID for VCZ prophylaxis -- no pain medication required at this time.  -- discussed role for zometa therapy in the setting her lytic lesions. Explained zometa is given q 12 weeks and requires a dental clearance. Will have Dr. Leonides Schanz re-discuss at next visit.   No orders of the defined types were placed in this encounter.  All questions were answered. The patient knows to call the clinic with any problems, questions or concerns.  I have spent a total of 30 minutes minutes of face-to-face and non-face-to-face time, preparing to see the patient,  performing a medically appropriate examination, counseling and educating the patient, ordering medications/tests/procedures,documenting clinical information in the electronic health record, and care coordination.   Georga Kaufmann PA-C Dept of Hematology and Oncology [facility] Phone: [phone]

## 2022-12-19 NOTE — Patient Instructions (Signed)
[place] [facility]  Discharge Instructions: Thank you for choosing [facility] to provide your oncology and hematology care.   If you have a lab appointment with the Cancer Center, please go directly to the Cancer Center and check in at the registration area.   Wear comfortable clothing and clothing appropriate for easy access to any Portacath or PICC line.   We strive to give you quality time with your provider. You may need to reschedule your appointment if you arrive late (15 or more minutes).  Arriving late affects you and other patients whose appointments are after yours.  Also, if you miss three or more appointments without notifying the office, you may be dismissed from the clinic at the provider's discretion.      For prescription refill requests, have your pharmacy contact our office and allow 72 hours for refills to be completed.    Today you received the following chemotherapy and/or immunotherapy agents :  Kyprolis   To help prevent nausea and vomiting after your treatment, we encourage you to take your nausea medication as directed.  BELOW ARE SYMPTOMS THAT SHOULD BE REPORTED IMMEDIATELY: *FEVER GREATER THAN 100.4 F (38 C) OR HIGHER *CHILLS OR SWEATING *NAUSEA AND VOMITING THAT IS NOT CONTROLLED WITH YOUR NAUSEA MEDICATION *UNUSUAL SHORTNESS OF BREATH *UNUSUAL BRUISING OR BLEEDING *URINARY PROBLEMS (pain or burning when urinating, or frequent urination) *BOWEL PROBLEMS (unusual diarrhea, constipation, pain near the anus) TENDERNESS IN MOUTH AND THROAT WITH OR WITHOUT PRESENCE OF ULCERS (sore throat, sores in mouth, or a toothache) UNUSUAL RASH, SWELLING OR PAIN  UNUSUAL VAGINAL DISCHARGE OR ITCHING   Items with * indicate a potential emergency and should be followed up as soon as possible or go to the Emergency Department if any problems should occur.  Please show the CHEMOTHERAPY ALERT CARD or IMMUNOTHERAPY ALERT CARD at  check-in to the Emergency Department and triage nurse.  Should you have questions after your visit or need to cancel or reschedule your appointment, please contact [facility]  Dept: [phone]  and follow the prompts.  Office hours are 8:00 a.m. to 4:30 p.m. Monday - Friday. Please note that voicemails left after 4:00 p.m. may not be returned until the following business day.  We are closed weekends and major holidays. You have access to a nurse at all times for urgent questions. Please call the main number to the clinic Dept: [phone] and follow the prompts.   For any non-urgent questions, you may also contact your provider using MyChart. We now offer e-Visits for anyone 18 and older to request care online for non-urgent symptoms. For details visit [email or web address].   Also download the MyChart app! Go to the app store, search "MyChart", open the app, select , and log in with your MyChart username and password.   

## 2022-12-22 LAB — KAPPA/LAMBDA LIGHT CHAINS
Kappa free light chain: 1.9 mg/L — ABNORMAL LOW (ref 3.3–19.4)
Kappa, lambda light chain ratio: 0.09 — ABNORMAL LOW (ref 0.26–1.65)
Lambda free light chains: 22.3 mg/L (ref 5.7–26.3)

## 2022-12-25 ENCOUNTER — Inpatient Hospital Stay: Payer: Medicare Other

## 2022-12-25 ENCOUNTER — Other Ambulatory Visit: Payer: Self-pay

## 2022-12-25 ENCOUNTER — Inpatient Hospital Stay (HOSPITAL_BASED_OUTPATIENT_CLINIC_OR_DEPARTMENT_OTHER): Payer: Medicare Other

## 2022-12-25 VITALS — BP 129/66 | HR 71 | Temp 98.2°F | Resp 18

## 2022-12-25 DIAGNOSIS — C9000 Multiple myeloma not having achieved remission: Secondary | ICD-10-CM

## 2022-12-25 DIAGNOSIS — Z5112 Encounter for antineoplastic immunotherapy: Secondary | ICD-10-CM | POA: Diagnosis not present

## 2022-12-25 DIAGNOSIS — Z79899 Other long term (current) drug therapy: Secondary | ICD-10-CM | POA: Diagnosis not present

## 2022-12-25 LAB — CBC WITH DIFFERENTIAL (CANCER CENTER ONLY)
Abs Immature Granulocytes: 0.1 10*3/uL — ABNORMAL HIGH (ref 0.00–0.07)
Basophils Absolute: 0 10*3/uL (ref 0.0–0.1)
Basophils Relative: 0 %
Eosinophils Absolute: 0.2 10*3/uL (ref 0.0–0.5)
Eosinophils Relative: 3 %
HCT: 42.7 % (ref 36.0–46.0)
Hemoglobin: 14.3 g/dL (ref 12.0–15.0)
Immature Granulocytes: 1 %
Lymphocytes Relative: 20 %
Lymphs Abs: 1.7 10*3/uL (ref 0.7–4.0)
MCH: 31.6 pg (ref 26.0–34.0)
MCHC: 33.5 g/dL (ref 30.0–36.0)
MCV: 94.3 fL (ref 80.0–100.0)
Monocytes Absolute: 0.8 10*3/uL (ref 0.1–1.0)
Monocytes Relative: 9 %
Neutro Abs: 5.6 10*3/uL (ref 1.7–7.7)
Neutrophils Relative %: 67 %
Platelet Count: 160 10*3/uL (ref 150–400)
RBC: 4.53 MIL/uL (ref 3.87–5.11)
RDW: 13.9 % (ref 11.5–15.5)
WBC Count: 8.5 10*3/uL (ref 4.0–10.5)
nRBC: 0 % (ref 0.0–0.2)

## 2022-12-25 LAB — CMP (CANCER CENTER ONLY)
ALT: 24 U/L (ref 0–44)
AST: 26 U/L (ref 15–41)
Albumin: 3.9 g/dL (ref 3.5–5.0)
Alkaline Phosphatase: 54 U/L (ref 38–126)
Anion gap: 7 (ref 5–15)
BUN: 10 mg/dL (ref 8–23)
CO2: 27 mmol/L (ref 22–32)
Calcium: 8.8 mg/dL — ABNORMAL LOW (ref 8.9–10.3)
Chloride: 106 mmol/L (ref 98–111)
Creatinine: 0.76 mg/dL (ref 0.44–1.00)
GFR, Estimated: >60 mL/min (ref >60)
Glucose, Bld: 112 mg/dL — ABNORMAL HIGH (ref 70–99)
Potassium: 4.2 mmol/L (ref 3.5–5.1)
Sodium: 140 mmol/L (ref 135–145)
Total Bilirubin: 0.8 mg/dL (ref 0.3–1.2)
Total Protein: 6.5 g/dL (ref 6.5–8.1)

## 2022-12-25 MED ORDER — DEXTROSE 5 % IV SOLN
70.0000 mg/m2 | Freq: Once | INTRAVENOUS | Status: AC
  Administered 2022-12-25: 120 mg via INTRAVENOUS
  Filled 2022-12-25: qty 60

## 2022-12-25 MED ORDER — SODIUM CHLORIDE 0.9 % IV SOLN: Freq: Once | INTRAVENOUS | Status: AC

## 2022-12-25 MED ORDER — SODIUM CHLORIDE 0.9 % IV SOLN
40.0000 mg | Freq: Once | INTRAVENOUS | Status: AC
  Administered 2022-12-25: 40 mg via INTRAVENOUS
  Filled 2022-12-25: qty 4

## 2022-12-25 NOTE — Patient Instructions (Signed)
[facility] [facility]  Discharge Instructions: Thank you for choosing [facility] to provide your oncology and hematology care.   If you have a lab appointment with the Cancer Center, please go directly to the Cancer Center and check in at the registration area.   Wear comfortable clothing and clothing appropriate for easy access to any Portacath or PICC line.   We strive to give you quality time with your provider. You may need to reschedule your appointment if you arrive late (15 or more minutes).  Arriving late affects you and other patients whose appointments are after yours.  Also, if you miss three or more appointments without notifying the office, you may be dismissed from the clinic at the provider's discretion.      For prescription refill requests, have your pharmacy contact our office and allow 72 hours for refills to be completed.    Today you received the following chemotherapy and/or immunotherapy agents :  Kyprolis   To help prevent nausea and vomiting after your treatment, we encourage you to take your nausea medication as directed.  BELOW ARE SYMPTOMS THAT SHOULD BE REPORTED IMMEDIATELY: *FEVER GREATER THAN 100.4 F (38 C) OR HIGHER *CHILLS OR SWEATING *NAUSEA AND VOMITING THAT IS NOT CONTROLLED WITH YOUR NAUSEA MEDICATION *UNUSUAL SHORTNESS OF BREATH *UNUSUAL BRUISING OR BLEEDING *URINARY PROBLEMS (pain or burning when urinating, or frequent urination) *BOWEL PROBLEMS (unusual diarrhea, constipation, pain near the anus) TENDERNESS IN MOUTH AND THROAT WITH OR WITHOUT PRESENCE OF ULCERS (sore throat, sores in mouth, or a toothache) UNUSUAL RASH, SWELLING OR PAIN  UNUSUAL VAGINAL DISCHARGE OR ITCHING   Items with * indicate a potential emergency and should be followed up as soon as possible or go to the Emergency Department if any problems should occur.  Please show the CHEMOTHERAPY ALERT CARD or IMMUNOTHERAPY ALERT CARD at  check-in to the Emergency Department and triage nurse.  Should you have questions after your visit or need to cancel or reschedule your appointment, please contact [facility]  Dept: [phone]  and follow the prompts.  Office hours are 8:00 a.m. to 4:30 p.m. Monday - Friday. Please note that voicemails left after 4:00 p.m. may not be returned until the following business day.  We are closed weekends and major holidays. You have access to a nurse at all times for urgent questions. Please call the main number to the clinic Dept: [phone] and follow the prompts.   For any non-urgent questions, you may also contact your provider using MyChart. We now offer e-Visits for anyone 18 and older to request care online for non-urgent symptoms. For details visit [email or web address].   Also download the MyChart app! Go to the app store, search "MyChart", open the app, select , and log in with your MyChart username and password.   

## 2022-12-26 LAB — MULTIPLE MYELOMA PANEL, SERUM
Albumin SerPl Elph-Mcnc: 3.4 g/dL (ref 2.9–4.4)
Albumin/Glob SerPl: 1.2 (ref 0.7–1.7)
Alpha 1: 0.3 g/dL (ref 0.0–0.4)
Alpha2 Glob SerPl Elph-Mcnc: 0.7 g/dL (ref 0.4–1.0)
B-Globulin SerPl Elph-Mcnc: 0.8 g/dL (ref 0.7–1.3)
Gamma Glob SerPl Elph-Mcnc: 1.1 g/dL (ref 0.4–1.8)
Globulin, Total: 2.9 g/dL (ref 2.2–3.9)
IgA: 12 mg/dL — ABNORMAL LOW (ref 64–422)
IgG (Immunoglobin G), Serum: 1137 mg/dL (ref 586–1602)
IgM (Immunoglobulin M), Srm: 23 mg/dL — ABNORMAL LOW (ref 26–217)
M Protein SerPl Elph-Mcnc: 0.8 g/dL — ABNORMAL HIGH
Total Protein ELP: 6.3 g/dL (ref 6.0–8.5)

## 2023-01-01 ENCOUNTER — Inpatient Hospital Stay: Payer: Medicare Other

## 2023-01-01 ENCOUNTER — Inpatient Hospital Stay: Payer: Medicare Other | Admitting: Hematology and Oncology

## 2023-01-01 ENCOUNTER — Other Ambulatory Visit: Payer: Self-pay

## 2023-01-01 VITALS — BP 126/66 | HR 69

## 2023-01-01 VITALS — BP 138/77 | HR 72 | Temp 98.4°F | Resp 16 | Ht 66.0 in | Wt 126.3 lb

## 2023-01-01 DIAGNOSIS — Z79899 Other long term (current) drug therapy: Secondary | ICD-10-CM | POA: Diagnosis not present

## 2023-01-01 DIAGNOSIS — C9000 Multiple myeloma not having achieved remission: Secondary | ICD-10-CM

## 2023-01-01 DIAGNOSIS — Z5112 Encounter for antineoplastic immunotherapy: Secondary | ICD-10-CM | POA: Diagnosis not present

## 2023-01-01 LAB — CBC WITH DIFFERENTIAL (CANCER CENTER ONLY)
Abs Immature Granulocytes: 0.17 10*3/uL — ABNORMAL HIGH (ref 0.00–0.07)
Basophils Absolute: 0.1 10*3/uL (ref 0.0–0.1)
Basophils Relative: 1 %
Eosinophils Absolute: 0.3 10*3/uL (ref 0.0–0.5)
Eosinophils Relative: 3 %
HCT: 39.9 % (ref 36.0–46.0)
Hemoglobin: 13.2 g/dL (ref 12.0–15.0)
Immature Granulocytes: 2 %
Lymphocytes Relative: 15 %
Lymphs Abs: 1.6 10*3/uL (ref 0.7–4.0)
MCH: 31.5 pg (ref 26.0–34.0)
MCHC: 33.1 g/dL (ref 30.0–36.0)
MCV: 95.2 fL (ref 80.0–100.0)
Monocytes Absolute: 0.8 10*3/uL (ref 0.1–1.0)
Monocytes Relative: 8 %
Neutro Abs: 7.9 10*3/uL — ABNORMAL HIGH (ref 1.7–7.7)
Neutrophils Relative %: 71 %
Platelet Count: 250 10*3/uL (ref 150–400)
RBC: 4.19 MIL/uL (ref 3.87–5.11)
RDW: 14.3 % (ref 11.5–15.5)
WBC Count: 10.8 10*3/uL — ABNORMAL HIGH (ref 4.0–10.5)
nRBC: 0 % (ref 0.0–0.2)

## 2023-01-01 LAB — CMP (CANCER CENTER ONLY)
ALT: 20 U/L (ref 0–44)
AST: 19 U/L (ref 15–41)
Albumin: 3.8 g/dL (ref 3.5–5.0)
Alkaline Phosphatase: 57 U/L (ref 38–126)
Anion gap: 6 (ref 5–15)
BUN: 17 mg/dL (ref 8–23)
CO2: 27 mmol/L (ref 22–32)
Calcium: 8.8 mg/dL — ABNORMAL LOW (ref 8.9–10.3)
Chloride: 106 mmol/L (ref 98–111)
Creatinine: 0.7 mg/dL (ref 0.44–1.00)
GFR, Estimated: >60 mL/min (ref >60)
Glucose, Bld: 115 mg/dL — ABNORMAL HIGH (ref 70–99)
Potassium: 4.4 mmol/L (ref 3.5–5.1)
Sodium: 139 mmol/L (ref 135–145)
Total Bilirubin: 0.9 mg/dL (ref 0.3–1.2)
Total Protein: 6.2 g/dL — ABNORMAL LOW (ref 6.5–8.1)

## 2023-01-01 MED ORDER — SODIUM CHLORIDE 0.9 % IV SOLN: Freq: Once | INTRAVENOUS | Status: AC

## 2023-01-01 MED ORDER — SODIUM CHLORIDE 0.9 % IV SOLN
40.0000 mg | Freq: Once | INTRAVENOUS | Status: AC
  Administered 2023-01-01: 40 mg via INTRAVENOUS
  Filled 2023-01-01: qty 4

## 2023-01-01 MED ORDER — DEXTROSE 5 % IV SOLN
70.0000 mg/m2 | Freq: Once | INTRAVENOUS | Status: AC
  Administered 2023-01-01: 120 mg via INTRAVENOUS
  Filled 2023-01-01: qty 60

## 2023-01-01 NOTE — Patient Instructions (Addendum)
[facility]  Discharge Instructions: Thank you for choosing [facility] to provide your oncology and hematology care.   If you have a lab appointment with the Cancer Center, please go directly to the Cancer Center and check in at the registration area.   Wear comfortable clothing and clothing appropriate for easy access to any Portacath or PICC line.   We strive to give you quality time with your provider. You may need to reschedule your appointment if you arrive late (15 or more minutes).  Arriving late affects you and other patients whose appointments are after yours.  Also, if you miss three or more appointments without notifying the office, you may be dismissed from the clinic at the provider's discretion.      For prescription refill requests, have your pharmacy contact our office and allow 72 hours for refills to be completed.    Today you received the following chemotherapy and/or immunotherapy agents :  Kyprolis   To help prevent nausea and vomiting after your treatment, we encourage you to take your nausea medication as directed.  BELOW ARE SYMPTOMS THAT SHOULD BE REPORTED IMMEDIATELY: *FEVER GREATER THAN 100.4 F (38 C) OR HIGHER *CHILLS OR SWEATING *NAUSEA AND VOMITING THAT IS NOT CONTROLLED WITH YOUR NAUSEA MEDICATION *UNUSUAL SHORTNESS OF BREATH *UNUSUAL BRUISING OR BLEEDING *URINARY PROBLEMS (pain or burning when urinating, or frequent urination) *BOWEL PROBLEMS (unusual diarrhea, constipation, pain near the anus) TENDERNESS IN MOUTH AND THROAT WITH OR WITHOUT PRESENCE OF ULCERS (sore throat, sores in mouth, or a toothache) UNUSUAL RASH, SWELLING OR PAIN  UNUSUAL VAGINAL DISCHARGE OR ITCHING   Items with * indicate a potential emergency and should be followed up as soon as possible or go to the Emergency Department if any problems should occur.  Please show the CHEMOTHERAPY ALERT CARD or IMMUNOTHERAPY ALERT CARD at  check-in to the Emergency Department and triage nurse.  Should you have questions after your visit or need to cancel or reschedule your appointment, please contact [facility]  Dept: [phone]  and follow the prompts.  Office hours are 8:00 a.m. to 4:30 p.m. Monday - Friday. Please note that voicemails left after 4:00 p.m. may not be returned until the following business day.  We are closed weekends and major holidays. You have access to a nurse at all times for urgent questions. Please call the main number to the clinic Dept: [phone] and follow the prompts.   For any non-urgent questions, you may also contact your provider using MyChart. We now offer e-Visits for anyone 18 and older to request care online for non-urgent symptoms. For details visit [email or web address].   Also download the MyChart app! Go to the app store, search "MyChart", open the app, select [place], and log in with your MyChart username and password.   

## 2023-01-01 NOTE — Progress Notes (Signed)
[facility] [phone]   [phone]  PROGRESS NOTE  Patient Care Team: Farris Has, MD as PCP - General (Family Medicine)  CHIEF COMPLAINTS/PURPOSE OF CONSULTATION:  IgG Lambda Multiple Myeloma  ONCOLOGIC HISTORY: 07/27/2018-09/2018: Received weekly velcade plus dexamethasone. Therapy changed due to poor response.  09/2018: Started Revlimid 25 mg for 21 days on, 7 days off of a 28 day cycle with dexamethasone. Therapy was held after 2 cycles because of severe dermatological toxicity 12/2018: Pomalyst 1 mg daily for 21 days.  She completed 4 cycles of therapy and therapy was held due to occurrence of dermatological toxicity.  07/2019: Started Ninlaro 4 mg weekly with dexamethasone 20 mg weekly for 3 weeks on and 1 week off. Daratumumab was addd in April 2021. Clearnce Sorrel was discontinued in November 2021. 05/18/2020: Started monthly daratumumab and transitioned to q 3 month schedule in April 2022.  11/06/2022: Darzalex discontinued due to progression of disease. M protein 0.9 with worsening lytic lesions in the skeleton.  11/20/2022: Cycle 1 Day 1 of Kyprolis/Dex therapy.  12/19/2022: Cycle 2 ay 1 of Kyprolis/Dex therapy  HISTORY OF PRESENTING ILLNESS:  Nicole Keith 82 y.o. female returns for a follow up for IgG lambda multiple myeloma. She was last seen on 12/19/2022. She presents today for Cycle 2, Day 15 of Kyprolis/Dex.   On exam today, Nicole Keith reports she has been well overall in the interim since her last visit.  She reports she is tolerating treatment well and is not having any reactions compared to her "prior treatments".  She notes that she is eager to have a repeat x-ray in order to evaluate the efficacy of the medication.  She reports she is having some difficulty with her "small veins" but at this time declines to have a port placed.  She does have some occasional loose stools and reports that if it does become too loose or frequent she takes  Imodium.  She is not having any nausea or vomiting.  She reports she may be down in weight today because her bowels have been moving so frequently.  We discussed submitted today in great detail she declines because of her fear of the potential side effects.  Overall she is willing and able to proceed with treatment today. She denies fevers, chills, sweats, shortness of breath, chest pain or cough. She has no other complaints. Rest of the 10 point ROS is below.  MEDICAL HISTORY:  Past Medical History:  Diagnosis Date   Allergic rhinitis    Arthritis    Colitis, collagenous    Diverticular disease    severe sig tics/fixation 2012   Family hx of colon cancer    Fracture of femoral neck, right (HCC) 01/09/2013   GERD (gastroesophageal reflux disease)    exacerbation of reflux-like symptoms 04/2011   Hiatal hernia    Hyperlipidemia    Osteoarthritis of right hip 01/10/2013    SURGICAL HISTORY: Past Surgical History:  Procedure Laterality Date   CHOLECYSTECTOMY     EYE SURGERY  feb 2016   cataract right eye   LEG SURGERY Left    TONSILLECTOMY     TOTAL HIP ARTHROPLASTY Right 01/10/2013   Procedure: TOTAL HIP ARTHROPLASTY;  Surgeon: Eulas Post, MD;  Location: [facility] OR;  Service: Orthopedics;  Laterality: Right;    SOCIAL HISTORY: Social History   Socioeconomic History   Marital status: Married    Spouse name: Not on file   Number of children: 0   Years  of education: Not on file   Highest education level: Some college, no degree  Occupational History   Occupation: retired  Tobacco Use   Smoking status: Former    Types: Cigarettes    Quit date: 01/10/1972    Years since quitting: 51.0   Smokeless tobacco: Never   Tobacco comments:    quit 1973  Vaping Use   Vaping Use: Never used  Substance and Sexual Activity   Alcohol use: No   Drug use: No   Sexual activity: Not on file  Other Topics Concern   Not on file  Social History Narrative   Married, no children. Drinks 2 cups  decaf coffee a day. Walks daily. Is active, plays golf, she and her husband go hiking frequently. 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   Social Determinants of Health   Financial Resource Strain: Not on file  Food Insecurity: Not on file  Transportation Needs: Not on file  Physical Activity: Not on file  Stress: Not on file  Social Connections: Not on file  Intimate Partner Violence: Not on file    FAMILY HISTORY: Family History  Problem Relation Age of Onset   Congestive Heart Failure Mother    Arthritis-Osteo Mother    Colon cancer Mother    Heart attack Father    Rheum arthritis Father    Colon cancer Maternal Aunt    Colon cancer Maternal Aunt    Hypothyroidism Sister     ALLERGIES:  is allergic to pomalyst [pomalidomide], revlimid [lenalidomide], betadine [povidone iodine], flagyl [metronidazole], fosamax [alendronate sodium], bactrim [sulfamethoxazole-trimethoprim], clindamycin/lincomycin, doxepin hcl, hydrocodone-acetaminophen, hydroxyzine, hydroxyzine hcl, tramadol hcl, valium [diazepam], actonel [risedronate sodium], boniva [ibandronic acid], doxycycline, and penicillins.  MEDICATIONS:  Current Outpatient Medications  Medication Sig Dispense Refill   acetaminophen (TYLENOL) 500 MG tablet Take 500 mg by mouth in the morning, at noon, and at bedtime.      diphenhydramine-acetaminophen (TYLENOL PM) 25-500 MG TABS tablet Take 2 tablets by mouth at bedtime as needed (for sleep).      famotidine-calcium carbonate-magnesium hydroxide (PEPCID COMPLETE) 10-800-165 MG chewable tablet Chew 1 tablet by mouth daily as needed (for heartburn or indigestion).      ibuprofen (ADVIL) 400 MG tablet Take 400 mg by mouth every 6 (six) hours.     triamcinolone cream (KENALOG) 0.1 % Apply 1 Application topically 2 (two) times daily. 453.6 g 2   No current facility-administered medications for this visit.    REVIEW OF SYSTEMS:   Constitutional: ( - ) fevers, ( - )  chills  , ( - ) night sweats Eyes: ( - ) blurriness of vision, ( - ) double vision, ( - ) watery eyes Ears, nose, mouth, throat, and face: ( - ) mucositis, ( - ) sore throat Respiratory: ( - ) cough, ( - ) dyspnea, ( - ) wheezes Cardiovascular: ( - ) palpitation, ( - ) chest discomfort, ( - ) lower extremity swelling Gastrointestinal:  ( - ) nausea, ( - ) heartburn, ( - ) change in bowel habits Skin: ( +) abnormal skin rashes Lymphatics: ( - ) new lymphadenopathy, ( - ) easy bruising Neurological: ( - ) numbness, ( - ) tingling, ( - ) new weaknesses Behavioral/Psych: ( - ) mood change, ( - ) new changes  All other systems were reviewed with the patient and are negative.  PHYSICAL EXAMINATION: ECOG PERFORMANCE STATUS: 1 - Symptomatic but completely ambulatory  Vitals:   01/01/23 0841  BP: 138/77  Pulse: 72  Resp: 16  Temp: 98.4 F (36.9 C)  SpO2: 96%   Filed Weights   01/01/23 0841  Weight: 126 lb 5 oz (57.3 kg)     GENERAL: well appearing female in NAD  SKIN: skin color, texture, turgor are normal. Diffuse, flat erythematous rash.  EYES: conjunctiva are pink and non-injected, sclera clear LYMPH:  no palpable lymphadenopathy in the cervical or supraclavicular lymph nodes.  LUNGS: clear to auscultation and percussion with normal breathing effort HEART: regular rate & rhythm and no murmurs and no lower extremity edema Musculoskeletal: no cyanosis of digits and no clubbing  PSYCH: alert & oriented x 3, fluent speech NEURO: no focal motor/sensory deficits  LABORATORY DATA:  I have reviewed the data as listed    Latest Ref Rng & Units 01/01/2023    8:31 AM 12/25/2022    8:27 AM 12/19/2022    8:45 AM  CBC  WBC 4.0 - 10.5 K/uL 10.8  8.5  8.4   Hemoglobin 12.0 - 15.0 g/dL 36.6  44.0  34.7   Hematocrit 36.0 - 46.0 % 39.9  42.7  40.8   Platelets 150 - 400 K/uL 250  160  462        Latest Ref Rng & Units 01/01/2023    8:31 AM 12/25/2022    8:27 AM 12/19/2022    8:45 AM  CMP  Glucose  70 - 99 mg/dL 425  956  387   BUN 8 - 23 mg/dL 17  10  11    Creatinine 0.44 - 1.00 mg/dL 5.64  3.32  9.51   Sodium 135 - 145 mmol/L 139  140  139   Potassium 3.5 - 5.1 mmol/L 4.4  4.2  4.2   Chloride 98 - 111 mmol/L 106  106  106   CO2 22 - 32 mmol/L 27  27  29    Calcium 8.9 - 10.3 mg/dL 8.8  8.8  8.6   Total Protein 6.5 - 8.1 g/dL 6.2  6.5  6.6   Total Bilirubin 0.3 - 1.2 mg/dL 0.9  0.8  0.7   Alkaline Phos 38 - 126 U/L 57  54  57   AST 15 - 41 U/L 19  26  21    ALT 0 - 44 U/L 20  24  18      ASSESSMENT & PLAN Nicole Keith is a 82 y.o.female who presents to the clinic for follow up for multiple myeloma. She was under the care of Dr. Clelia Croft and will transfer to Dr. Leonides Schanz moving forward.   #IgG Lambda Multiple Myeloma: --Initially presented in April 2016 with M spike of 3.2 g/dL and IgG level of 8841. Underwent bone marrow biopsy from 11/20/2014 show 29% plasma cells. 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  --In December 2019 with rising M spike of 5.9 g/dL and worsening anemia, with Hgb 8.7, she met criteria for transformation of active multiple myeloma.  --From 07/27/2018-09/2018, received weekly velcade plus dexamethasone. Therapy changed due to poor response.  --In 09/2018, started Revlimid 25 mg for 21 days on, 7 days off of a 28 day cycle with dexamethasone. Therapy was held after 2 cycles because of severe dermatological toxicity --In 12/2018, switched to Pomalyst 1 mg daily for 21 days.  She completed 4 cycles of therapy and therapy was held due to occurrence of dermatological toxicity.  --In 07/2019, switch to Ninlaro 4 mg weekly with dexamethasone 20 mg weekly for 3 weeks on and 1 week off. Daratumumab was  addd in April 2021. Clearnce Sorrel was discontinued in November 2021. --On 05/18/2020, started monthly daratumumab and transitioned to q 3 month schedule in April 2022.  --On 11/20/2022, switch to Kyprolis/Dex due to rising M-protein and bone survey that showed  worsening lytic lesions.  PLAN:   --Labs today show white blood cell count 10.8, hemoglobin 13.2, MCV 95.2, and platelets of 250.  Creatinine 0.70 with normal LFTs. --Due Cycle 2 Day 15 of Kyprolis/Dex. Proceed without any dose modifications.  --Plan for repeat metastatic survey in late June 2024. --RTC in 2 weeks for Cycle 3 Day 1 of Kyprolis/Dex with continued weekly treatments.   #Diffuse erythematous rash: --Managed with kenalog cream. -- nystatin powder for groin area.  --Discussed referral to dermatology, patient deferred at this time.   #Supportive Care -- port not required but can be placed if requested.  -- zofran 8mg  q8H PRN and compazine 10mg  PO q6H for nausea -- acyclovir 400mg  PO BID for VCZ prophylaxis -- no pain medication required at this time.  -- discussed role for zometa therapy in the setting her lytic lesions. Explained zometa is given q 12 weeks and requires a dental clearance.  After discussing this with the patient further today she knows she would like to hold on this for the time being as she is afraid of the side effects.  Strongly emphasized that she should reconsider as this will be helpful with her lytic bone lesions.  Orders Placed This Encounter  Procedures   DG Bone Survey Met    Standing Status:   Future    Standing Expiration Date:   01/01/2024    Order Specific Question:   Reason for Exam (SYMPTOM  OR DIAGNOSIS REQUIRED)    Answer:   multiple myeloma, assess for progression    Order Specific Question:   Preferred imaging location?    Answer:   [facility]   All questions were answered. The patient knows to call the clinic with any problems, questions or concerns.  I have spent a total of 30 minutes minutes of face-to-face and non-face-to-face time, preparing to see the patient,  performing a medically appropriate examination, counseling and educating the patient, ordering medications/tests/procedures,documenting clinical information in the  electronic health record, and care coordination.   Ulysees Barns, MD Department of Hematology/Oncology [facility] Phone: [phone] Pager: [phone] Email: [email or web address]

## 2023-01-15 ENCOUNTER — Other Ambulatory Visit: Payer: Self-pay | Admitting: *Deleted

## 2023-01-15 ENCOUNTER — Other Ambulatory Visit: Payer: Self-pay

## 2023-01-15 ENCOUNTER — Inpatient Hospital Stay: Payer: Medicare Other | Attending: Physician Assistant

## 2023-01-15 ENCOUNTER — Inpatient Hospital Stay: Payer: Medicare Other | Admitting: Hematology and Oncology

## 2023-01-15 ENCOUNTER — Inpatient Hospital Stay: Payer: Medicare Other

## 2023-01-15 VITALS — BP 136/78 | HR 67 | Temp 98.5°F | Resp 16 | Wt 127.6 lb

## 2023-01-15 VITALS — BP 133/67 | HR 66 | Resp 17

## 2023-01-15 DIAGNOSIS — Z79899 Other long term (current) drug therapy: Secondary | ICD-10-CM | POA: Insufficient documentation

## 2023-01-15 DIAGNOSIS — Z1152 Encounter for screening for COVID-19: Secondary | ICD-10-CM | POA: Diagnosis not present

## 2023-01-15 DIAGNOSIS — A419 Sepsis, unspecified organism: Secondary | ICD-10-CM | POA: Diagnosis not present

## 2023-01-15 DIAGNOSIS — R059 Cough, unspecified: Secondary | ICD-10-CM | POA: Diagnosis not present

## 2023-01-15 DIAGNOSIS — R7302 Impaired glucose tolerance (oral): Secondary | ICD-10-CM | POA: Diagnosis not present

## 2023-01-15 DIAGNOSIS — I959 Hypotension, unspecified: Secondary | ICD-10-CM | POA: Diagnosis not present

## 2023-01-15 DIAGNOSIS — G893 Neoplasm related pain (acute) (chronic): Secondary | ICD-10-CM | POA: Diagnosis not present

## 2023-01-15 DIAGNOSIS — R0602 Shortness of breath: Secondary | ICD-10-CM | POA: Diagnosis not present

## 2023-01-15 DIAGNOSIS — C9000 Multiple myeloma not having achieved remission: Secondary | ICD-10-CM

## 2023-01-15 DIAGNOSIS — J9601 Acute respiratory failure with hypoxia: Secondary | ICD-10-CM | POA: Diagnosis not present

## 2023-01-15 DIAGNOSIS — Z87891 Personal history of nicotine dependence: Secondary | ICD-10-CM | POA: Diagnosis not present

## 2023-01-15 DIAGNOSIS — E860 Dehydration: Secondary | ICD-10-CM | POA: Diagnosis not present

## 2023-01-15 DIAGNOSIS — T451X5A Adverse effect of antineoplastic and immunosuppressive drugs, initial encounter: Secondary | ICD-10-CM | POA: Diagnosis not present

## 2023-01-15 DIAGNOSIS — I214 Non-ST elevation (NSTEMI) myocardial infarction: Secondary | ICD-10-CM | POA: Diagnosis not present

## 2023-01-15 DIAGNOSIS — D6959 Other secondary thrombocytopenia: Secondary | ICD-10-CM | POA: Diagnosis not present

## 2023-01-15 DIAGNOSIS — E872 Acidosis, unspecified: Secondary | ICD-10-CM | POA: Diagnosis not present

## 2023-01-15 DIAGNOSIS — E785 Hyperlipidemia, unspecified: Secondary | ICD-10-CM | POA: Diagnosis not present

## 2023-01-15 DIAGNOSIS — J440 Chronic obstructive pulmonary disease with acute lower respiratory infection: Secondary | ICD-10-CM | POA: Diagnosis not present

## 2023-01-15 DIAGNOSIS — Z5112 Encounter for antineoplastic immunotherapy: Secondary | ICD-10-CM | POA: Insufficient documentation

## 2023-01-15 DIAGNOSIS — Z880 Allergy status to penicillin: Secondary | ICD-10-CM | POA: Diagnosis not present

## 2023-01-15 DIAGNOSIS — M1990 Unspecified osteoarthritis, unspecified site: Secondary | ICD-10-CM | POA: Diagnosis not present

## 2023-01-15 DIAGNOSIS — M810 Age-related osteoporosis without current pathological fracture: Secondary | ICD-10-CM | POA: Diagnosis not present

## 2023-01-15 DIAGNOSIS — Z881 Allergy status to other antibiotic agents status: Secondary | ICD-10-CM | POA: Diagnosis not present

## 2023-01-15 DIAGNOSIS — N179 Acute kidney failure, unspecified: Secondary | ICD-10-CM | POA: Diagnosis not present

## 2023-01-15 DIAGNOSIS — K219 Gastro-esophageal reflux disease without esophagitis: Secondary | ICD-10-CM | POA: Diagnosis not present

## 2023-01-15 DIAGNOSIS — Z8249 Family history of ischemic heart disease and other diseases of the circulatory system: Secondary | ICD-10-CM | POA: Diagnosis not present

## 2023-01-15 LAB — CBC WITH DIFFERENTIAL (CANCER CENTER ONLY)
Abs Immature Granulocytes: 0.05 10*3/uL (ref 0.00–0.07)
Basophils Absolute: 0.1 10*3/uL (ref 0.0–0.1)
Basophils Relative: 1 %
Eosinophils Absolute: 0.2 10*3/uL (ref 0.0–0.5)
Eosinophils Relative: 3 %
HCT: 38.8 % (ref 36.0–46.0)
Hemoglobin: 13 g/dL (ref 12.0–15.0)
Immature Granulocytes: 1 %
Lymphocytes Relative: 24 %
Lymphs Abs: 1.9 10*3/uL (ref 0.7–4.0)
MCH: 32.6 pg (ref 26.0–34.0)
MCHC: 33.5 g/dL (ref 30.0–36.0)
MCV: 97.2 fL (ref 80.0–100.0)
Monocytes Absolute: 0.7 10*3/uL (ref 0.1–1.0)
Monocytes Relative: 9 %
Neutro Abs: 5.1 10*3/uL (ref 1.7–7.7)
Neutrophils Relative %: 62 %
Platelet Count: 566 10*3/uL — ABNORMAL HIGH (ref 150–400)
RBC: 3.99 MIL/uL (ref 3.87–5.11)
RDW: 14.5 % (ref 11.5–15.5)
WBC Count: 8.1 10*3/uL (ref 4.0–10.5)
nRBC: 0 % (ref 0.0–0.2)

## 2023-01-15 LAB — CMP (CANCER CENTER ONLY)
ALT: 17 U/L (ref 0–44)
AST: 19 U/L (ref 15–41)
Albumin: 3.9 g/dL (ref 3.5–5.0)
Alkaline Phosphatase: 56 U/L (ref 38–126)
Anion gap: 6 (ref 5–15)
BUN: 9 mg/dL (ref 8–23)
CO2: 28 mmol/L (ref 22–32)
Calcium: 8.8 mg/dL — ABNORMAL LOW (ref 8.9–10.3)
Chloride: 105 mmol/L (ref 98–111)
Creatinine: 0.83 mg/dL (ref 0.44–1.00)
GFR, Estimated: >60 mL/min (ref >60)
Glucose, Bld: 111 mg/dL — ABNORMAL HIGH (ref 70–99)
Potassium: 4.3 mmol/L (ref 3.5–5.1)
Sodium: 139 mmol/L (ref 135–145)
Total Bilirubin: 0.8 mg/dL (ref 0.3–1.2)
Total Protein: 6.3 g/dL — ABNORMAL LOW (ref 6.5–8.1)

## 2023-01-15 MED ORDER — SODIUM CHLORIDE 0.9 % IV SOLN: Freq: Once | INTRAVENOUS | Status: AC

## 2023-01-15 MED ORDER — DEXTROSE 5 % IV SOLN
70.0000 mg/m2 | Freq: Once | INTRAVENOUS | Status: AC
  Administered 2023-01-15: 120 mg via INTRAVENOUS
  Filled 2023-01-15: qty 60

## 2023-01-15 MED ORDER — SODIUM CHLORIDE 0.9 % IV SOLN
40.0000 mg | Freq: Once | INTRAVENOUS | Status: AC
  Administered 2023-01-15: 40 mg via INTRAVENOUS
  Filled 2023-01-15: qty 4

## 2023-01-15 MED ORDER — ACYCLOVIR 400 MG PO TABS: 400.0000 mg | ORAL_TABLET | Freq: Two times a day (BID) | ORAL | 3 refills | Status: DC

## 2023-01-15 NOTE — Patient Instructions (Signed)
[facility]   Discharge Instructions: Thank you for choosing [facility] to provide your oncology and hematology care.   If you have a lab appointment with the Cancer Center, please go directly to the Cancer Center and check in at the registration area.   Wear comfortable clothing and clothing appropriate for easy access to any Portacath or PICC line.   We strive to give you quality time with your provider. You may need to reschedule your appointment if you arrive late (15 or more minutes).  Arriving late affects you and other patients whose appointments are after yours.  Also, if you miss three or more appointments without notifying the office, you may be dismissed from the clinic at the provider's discretion.      For prescription refill requests, have your pharmacy contact our office and allow 72 hours for refills to be completed.    Today you received the following chemotherapy and/or immunotherapy agents: Carfilzomib (Kyprolis)       To help prevent nausea and vomiting after your treatment, we encourage you to take your nausea medication as directed.  BELOW ARE SYMPTOMS THAT SHOULD BE REPORTED IMMEDIATELY: *FEVER GREATER THAN 100.4 F (38 C) OR HIGHER *CHILLS OR SWEATING *NAUSEA AND VOMITING THAT IS NOT CONTROLLED WITH YOUR NAUSEA MEDICATION *UNUSUAL SHORTNESS OF BREATH *UNUSUAL BRUISING OR BLEEDING *URINARY PROBLEMS (pain or burning when urinating, or frequent urination) *BOWEL PROBLEMS (unusual diarrhea, constipation, pain near the anus) TENDERNESS IN MOUTH AND THROAT WITH OR WITHOUT PRESENCE OF ULCERS (sore throat, sores in mouth, or a toothache) UNUSUAL RASH, SWELLING OR PAIN  UNUSUAL VAGINAL DISCHARGE OR ITCHING   Items with * indicate a potential emergency and should be followed up as soon as possible or go to the Emergency Department if any problems should occur.  Please show the CHEMOTHERAPY ALERT CARD or IMMUNOTHERAPY  ALERT CARD at check-in to the Emergency Department and triage nurse.  Should you have questions after your visit or need to cancel or reschedule your appointment, please contact [facility]  Dept: [phone]  and follow the prompts.  Office hours are 8:00 a.m. to 4:30 p.m. Monday - Friday. Please note that voicemails left after 4:00 p.m. may not be returned until the following business day.  We are closed weekends and major holidays. You have access to a nurse at all times for urgent questions. Please call the main number to the clinic Dept: [phone] and follow the prompts.   For any non-urgent questions, you may also contact your provider using MyChart. We now offer e-Visits for anyone 18 and older to request care online for non-urgent symptoms. For details visit [email or web address].   Also download the MyChart app! Go to the app store, search "MyChart", open the app, select [facility], and log in with your MyChart username and password. 

## 2023-01-15 NOTE — Progress Notes (Signed)
[facility] [phone]   [phone]  PROGRESS NOTE  Patient Care Team: Farris Has, MD as PCP - General (Family Medicine)  CHIEF COMPLAINTS/PURPOSE OF CONSULTATION:  IgG Lambda Multiple Myeloma  ONCOLOGIC HISTORY: 07/27/2018-09/2018: Received weekly velcade plus dexamethasone. Therapy changed due to poor response.  09/2018: Started Revlimid 25 mg for 21 days on, 7 days off of a 28 day cycle with dexamethasone. Therapy was held after 2 cycles because of severe dermatological toxicity 12/2018: Pomalyst 1 mg daily for 21 days.  She completed 4 cycles of therapy and therapy was held due to occurrence of dermatological toxicity.  07/2019: Started Ninlaro 4 mg weekly with dexamethasone 20 mg weekly for 3 weeks on and 1 week off. Daratumumab was addd in April 2021. Clearnce Sorrel was discontinued in November 2021. 05/18/2020: Started monthly daratumumab and transitioned to q 3 month schedule in April 2022.  11/06/2022: Darzalex discontinued due to progression of disease. M protein 0.9 with worsening lytic lesions in the skeleton.  11/20/2022: Cycle 1 Day 1 of Kyprolis/Dex therapy.  12/19/2022: Cycle 2 Day 1 of Kyprolis/Dex therapy 01/15/2023: Cycle 3 Day 1 of Kyprolis/Dex therapy  HISTORY OF PRESENTING ILLNESS:  Nicole Keith 82 y.o. female returns for a follow up for IgG lambda multiple myeloma. She was last seen on 01/01/2023. She presents today for Cycle 3, Day 1 of Kyprolis/Dex.   On exam today, Nicole Keith reports she has been well overall in the interim since her last visit.  She reports that she does continue to have rash on her arms and chest but has not worsened.  It does appear to respond to the Kenalog cream.  She reports that she is tolerating treatment well with no major side effects.  She notes that she is very excited that [place]  She reports that she has not been having any issues with nausea, vomiting,  or diarrhea.  She does have some occasional tingling in her toes on occasion but reports this is chronic and predates her multiple myeloma.  Overall she is willing and able to proceed with treatment today. She denies fevers, chills, sweats, shortness of breath, chest pain or cough. She has no other complaints. Rest of the 10 point ROS is below.  MEDICAL HISTORY:  Past Medical History:  Diagnosis Date   Allergic rhinitis    Arthritis    Colitis, collagenous    Diverticular disease    severe sig tics/fixation 2012   Family hx of colon cancer    Fracture of femoral neck, right (HCC) 01/09/2013   GERD (gastroesophageal reflux disease)    exacerbation of reflux-like symptoms 04/2011   Hiatal hernia    Hyperlipidemia    Osteoarthritis of right hip 01/10/2013    SURGICAL HISTORY: Past Surgical History:  Procedure Laterality Date   CHOLECYSTECTOMY     EYE SURGERY  feb 2016   cataract right eye   LEG SURGERY Left    TONSILLECTOMY     TOTAL HIP ARTHROPLASTY Right 01/10/2013   Procedure: TOTAL HIP ARTHROPLASTY;  Surgeon: Eulas Post, MD;  Location: [facility] OR;  Service: Orthopedics;  Laterality: Right;    SOCIAL HISTORY: Social History   Socioeconomic History   Marital status: Married    Spouse name: Not on file   Number of children: 0   Years of education: Not on file   Highest education level: Some college, no degree  Occupational History   Occupation: retired  Tobacco Use   Smoking status: Former    Types: Cigarettes    Quit date: 01/10/1972    Years since quitting: 51.0   Smokeless tobacco: Never   Tobacco comments:    quit 1973  Vaping Use   Vaping Use: Never used  Substance and Sexual Activity   Alcohol use: No   Drug use: No   Sexual activity: Not on file  Other Topics Concern   Not on file  Social History Narrative   Married, no children. Drinks 2 cups decaf coffee a day. Walks daily. Is active, plays golf, she and her husband go hiking frequently. 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   Social Determinants of Health   Financial Resource Strain: Not on file  Food Insecurity: Not on file  Transportation Needs: Not on file  Physical Activity: Not on file  Stress: Not on file  Social Connections: Not on file  Intimate Partner Violence: Not on file    FAMILY HISTORY: Family History  Problem Relation Age of Onset   Congestive Heart Failure Mother    Arthritis-Osteo Mother    Colon cancer Mother    Heart attack Father    Rheum arthritis Father    Colon cancer Maternal Aunt    Colon cancer Maternal Aunt    Hypothyroidism Sister     ALLERGIES:  is allergic to pomalyst [pomalidomide], revlimid [lenalidomide], betadine [povidone iodine], flagyl [metronidazole], fosamax [alendronate sodium], bactrim [sulfamethoxazole-trimethoprim], clindamycin/lincomycin, doxepin hcl, hydrocodone-acetaminophen, hydroxyzine, hydroxyzine hcl, tramadol hcl, valium [diazepam], actonel [risedronate sodium], boniva [ibandronic acid], doxycycline, and penicillins.  MEDICATIONS:  Current Outpatient Medications  Medication Sig Dispense Refill   acetaminophen (TYLENOL) 500 MG tablet Take 500 mg by mouth in the morning, at noon, and at bedtime.      acyclovir (ZOVIRAX) 400 MG tablet Take 1 tablet (400 mg total) by mouth 2 (two) times daily. 60 tablet 3   diphenhydramine-acetaminophen (TYLENOL PM) 25-500 MG TABS tablet Take 2 tablets by mouth at bedtime as needed (for sleep).      famotidine-calcium carbonate-magnesium hydroxide (PEPCID COMPLETE) 10-800-165 MG chewable tablet Chew 1 tablet by mouth daily as needed (for heartburn or indigestion).      ibuprofen (ADVIL) 400 MG tablet Take 400 mg by mouth every 6 (six) hours.     triamcinolone cream (KENALOG) 0.1 % Apply 1 Application topically 2 (two) times daily. 453.6 g 2   No current facility-administered medications for this visit.    REVIEW OF SYSTEMS:   Constitutional: ( - ) fevers, ( - )  chills , ( - )  night sweats Eyes: ( - ) blurriness of vision, ( - ) double vision, ( - ) watery eyes Ears, nose, mouth, throat, and face: ( - ) mucositis, ( - ) sore throat Respiratory: ( - ) cough, ( - ) dyspnea, ( - ) wheezes Cardiovascular: ( - ) palpitation, ( - ) chest discomfort, ( - ) lower extremity swelling Gastrointestinal:  ( - ) nausea, ( - ) heartburn, ( - ) change in bowel habits Skin: ( +) abnormal skin rashes Lymphatics: ( - ) new lymphadenopathy, ( - ) easy bruising Neurological: ( - ) numbness, ( - ) tingling, ( - ) new weaknesses Behavioral/Psych: ( - ) mood change, ( - ) new changes  All other systems were reviewed with the patient and are negative.  PHYSICAL EXAMINATION: ECOG PERFORMANCE STATUS: 1 - Symptomatic but completely ambulatory  Vitals:   01/15/23 0815  BP: 136/78  Pulse: 67  Resp: 16  Temp: 98.5 F (36.9 C)  SpO2: 97%    Filed Weights   01/15/23 0815  Weight: 127 lb 9.6 oz (57.9 kg)      GENERAL: well appearing female in NAD  SKIN: skin color, texture, turgor are normal. Diffuse, flat erythematous rash.  EYES: conjunctiva are pink and non-injected, sclera clear LYMPH:  no palpable lymphadenopathy in the cervical or supraclavicular lymph nodes.  LUNGS: clear to auscultation and percussion with normal breathing effort HEART: regular rate & rhythm and no murmurs and no lower extremity edema Musculoskeletal: no cyanosis of digits and no clubbing  PSYCH: alert & oriented x 3, fluent speech NEURO: no focal motor/sensory deficits  LABORATORY DATA:  I have reviewed the data as listed    Latest Ref Rng & Units 01/15/2023    7:45 AM 01/01/2023    8:31 AM 12/25/2022    8:27 AM  CBC  WBC 4.0 - 10.5 K/uL 8.1  10.8  8.5   Hemoglobin 12.0 - 15.0 g/dL 69.6  29.5  28.4   Hematocrit 36.0 - 46.0 % 38.8  39.9  42.7   Platelets 150 - 400 K/uL 566  250  160        Latest Ref Rng & Units 01/15/2023    7:45 AM 01/01/2023    8:31 AM 12/25/2022    8:27 AM  CMP  Glucose 70  - 99 mg/dL 132  440  102   BUN 8 - 23 mg/dL 9  17  10    Creatinine 0.44 - 1.00 mg/dL 7.25  3.66  4.40   Sodium 135 - 145 mmol/L 139  139  140   Potassium 3.5 - 5.1 mmol/L 4.3  4.4  4.2   Chloride 98 - 111 mmol/L 105  106  106   CO2 22 - 32 mmol/L 28  27  27    Calcium 8.9 - 10.3 mg/dL 8.8  8.8  8.8   Total Protein 6.5 - 8.1 g/dL 6.3  6.2  6.5   Total Bilirubin 0.3 - 1.2 mg/dL 0.8  0.9  0.8   Alkaline Phos 38 - 126 U/L 56  57  54   AST 15 - 41 U/L 19  19  26    ALT 0 - 44 U/L 17  20  24      ASSESSMENT & PLAN Nicole Keith is a 82 y.o.female who presents to the clinic for follow up for multiple myeloma. She was under the care of Dr. Clelia Croft and will transfer to Dr. Leonides Schanz moving forward.   #IgG Lambda Multiple Myeloma: --Initially presented in April 2016 with M spike of 3.2 g/dL and IgG level of 3474. Underwent bone marrow biopsy from 11/20/2014 show 29% plasma cells. 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  --In December 2019 with rising M spike of 5.9 g/dL and worsening anemia, with Hgb 8.7, she met criteria for transformation of active multiple myeloma.  --From 07/27/2018-09/2018, received weekly velcade plus dexamethasone. Therapy changed due to poor response.  --In 09/2018, started Revlimid 25 mg for 21 days on, 7 days off of a 28 day cycle with dexamethasone. Therapy was held after 2 cycles because of severe dermatological toxicity --In 12/2018, switched to Pomalyst 1 mg daily for 21 days.  She completed 4 cycles of therapy and therapy was held due to occurrence of dermatological toxicity.  --In 07/2019, switch to Ninlaro 4 mg weekly with dexamethasone 20 mg weekly for 3 weeks on and 1 week off.  Daratumumab was addd in April 2021. Clearnce Sorrel was discontinued in November 2021. --On 05/18/2020, started monthly daratumumab and transitioned to q 3 month schedule in April 2022.  --On 11/20/2022, switch to Kyprolis/Dex due to rising M-protein and bone survey that showed  worsening lytic lesions.  PLAN:   --Labs today show white blood cell count 8.1, Hgb 13.0, MCV 97.2, Plt 566.  Creatinine 0.83 with normal LFTs. --Due Cycle 3 Day 1 of Kyprolis/Dex. Proceed without any dose modifications.  --Plan for repeat metastatic survey in late June 2024. --RTC in 2 weeks for Cycle 3 Day 15 of Kyprolis/Dex with continued weekly treatments.   #Diffuse erythematous rash-stable --Managed with kenalog cream. -- nystatin powder for groin area.  --Discussed referral to dermatology, patient deferred at this time.   #Supportive Care -- port not required but can be placed if requested.  -- zofran 8mg  q8H PRN and compazine 10mg  PO q6H for nausea -- acyclovir 400mg  PO BID for VCZ prophylaxis -- no pain medication required at this time.  -- discussed role for zometa therapy in the setting her lytic lesions. Explained zometa is given q 12 weeks and requires a dental clearance.  After discussing this with the patient further today she knows she would like to hold on this for the time being as she is afraid of the side effects.  Strongly emphasized that she should reconsider as this will be helpful with her lytic bone lesions.  Orders Placed This Encounter  Procedures   Multiple Myeloma Panel (SPEP&IFE w/QIG)    Standing Status:   Future    Standing Expiration Date:   02/12/2024   Kappa/lambda light chains    Standing Status:   Future    Standing Expiration Date:   02/12/2024   CBC with Differential (Cancer Center Only)    Standing Status:   Future    Standing Expiration Date:   02/12/2024   CMP (Cancer Center only)    Standing Status:   Future    Standing Expiration Date:   02/12/2024   CBC with Differential (Cancer Center Only)    Standing Status:   Future    Standing Expiration Date:   02/19/2024   CMP (Cancer Center only)    Standing Status:   Future    Standing Expiration Date:   02/19/2024   CBC with Differential (Cancer Center Only)    Standing Status:   Future    Standing  Expiration Date:   02/26/2024   CMP (Cancer Center only)    Standing Status:   Future    Standing Expiration Date:   02/26/2024   Multiple Myeloma Panel (SPEP&IFE w/QIG)    Standing Status:   Future    Standing Expiration Date:   03/11/2024   Kappa/lambda light chains    Standing Status:   Future    Standing Expiration Date:   03/11/2024   CBC with Differential (Cancer Center Only)    Standing Status:   Future    Standing Expiration Date:   03/11/2024   CMP (Cancer Center only)    Standing Status:   Future    Standing Expiration Date:   03/11/2024   CBC with Differential (Cancer Center Only)    Standing Status:   Future    Standing Expiration Date:   03/18/2024   CMP (Cancer Center only)    Standing Status:   Future    Standing Expiration Date:   03/18/2024   CBC with Differential (Cancer Center Only)    Standing Status:  Future    Standing Expiration Date:   03/25/2024   CMP (Cancer Center only)    Standing Status:   Future    Standing Expiration Date:   03/25/2024   All questions were answered. The patient knows to call the clinic with any problems, questions or concerns.  I have spent a total of 30 minutes minutes of face-to-face and non-face-to-face time, preparing to see the patient,  performing a medically appropriate examination, counseling and educating the patient, ordering medications/tests/procedures,documenting clinical information in the electronic health record, and care coordination.   Ulysees Barns, MD Department of Hematology/Oncology [facility] Phone: [phone] Pager: [phone] Email: [email or web address]

## 2023-01-16 ENCOUNTER — Encounter (HOSPITAL_COMMUNITY): Payer: Self-pay | Admitting: Internal Medicine

## 2023-01-16 ENCOUNTER — Other Ambulatory Visit: Payer: Self-pay

## 2023-01-16 ENCOUNTER — Inpatient Hospital Stay (HOSPITAL_COMMUNITY): Payer: Medicare Other

## 2023-01-16 ENCOUNTER — Inpatient Hospital Stay (HOSPITAL_COMMUNITY)
Admission: EM | Admit: 2023-01-16 | Discharge: 2023-01-20 | DRG: 871 | Disposition: A | Discharge status: Home / Self Care | Payer: Medicare Other | Attending: Family Medicine | Admitting: Family Medicine

## 2023-01-16 ENCOUNTER — Emergency Department (HOSPITAL_COMMUNITY): Payer: Medicare Other

## 2023-01-16 DIAGNOSIS — K219 Gastro-esophageal reflux disease without esophagitis: Secondary | ICD-10-CM | POA: Diagnosis present

## 2023-01-16 DIAGNOSIS — T451X5A Adverse effect of antineoplastic and immunosuppressive drugs, initial encounter: Secondary | ICD-10-CM | POA: Diagnosis present

## 2023-01-16 DIAGNOSIS — Z9981 Dependence on supplemental oxygen: Secondary | ICD-10-CM | POA: Diagnosis not present

## 2023-01-16 DIAGNOSIS — M810 Age-related osteoporosis without current pathological fracture: Secondary | ICD-10-CM | POA: Diagnosis present

## 2023-01-16 DIAGNOSIS — Z9049 Acquired absence of other specified parts of digestive tract: Secondary | ICD-10-CM

## 2023-01-16 DIAGNOSIS — R7989 Other specified abnormal findings of blood chemistry: Secondary | ICD-10-CM | POA: Diagnosis not present

## 2023-01-16 DIAGNOSIS — G893 Neoplasm related pain (acute) (chronic): Secondary | ICD-10-CM | POA: Diagnosis present

## 2023-01-16 DIAGNOSIS — Z1152 Encounter for screening for COVID-19: Secondary | ICD-10-CM | POA: Diagnosis not present

## 2023-01-16 DIAGNOSIS — I214 Non-ST elevation (NSTEMI) myocardial infarction: Secondary | ICD-10-CM | POA: Diagnosis present

## 2023-01-16 DIAGNOSIS — J9601 Acute respiratory failure with hypoxia: Secondary | ICD-10-CM | POA: Diagnosis present

## 2023-01-16 DIAGNOSIS — E872 Acidosis, unspecified: Secondary | ICD-10-CM | POA: Diagnosis present

## 2023-01-16 DIAGNOSIS — Z8781 Personal history of (healed) traumatic fracture: Secondary | ICD-10-CM

## 2023-01-16 DIAGNOSIS — R059 Cough, unspecified: Secondary | ICD-10-CM | POA: Diagnosis not present

## 2023-01-16 DIAGNOSIS — M1990 Unspecified osteoarthritis, unspecified site: Secondary | ICD-10-CM | POA: Diagnosis present

## 2023-01-16 DIAGNOSIS — J159 Unspecified bacterial pneumonia: Secondary | ICD-10-CM | POA: Diagnosis not present

## 2023-01-16 DIAGNOSIS — Z800 Family history of malignant neoplasm of digestive organs: Secondary | ICD-10-CM

## 2023-01-16 DIAGNOSIS — J440 Chronic obstructive pulmonary disease with acute lower respiratory infection: Secondary | ICD-10-CM | POA: Diagnosis present

## 2023-01-16 DIAGNOSIS — Z883 Allergy status to other anti-infective agents status: Secondary | ICD-10-CM

## 2023-01-16 DIAGNOSIS — C9000 Multiple myeloma not having achieved remission: Secondary | ICD-10-CM | POA: Diagnosis present

## 2023-01-16 DIAGNOSIS — R6889 Other general symptoms and signs: Secondary | ICD-10-CM | POA: Diagnosis not present

## 2023-01-16 DIAGNOSIS — R197 Diarrhea, unspecified: Secondary | ICD-10-CM | POA: Diagnosis not present

## 2023-01-16 DIAGNOSIS — A419 Sepsis, unspecified organism: Secondary | ICD-10-CM | POA: Diagnosis present

## 2023-01-16 DIAGNOSIS — Z87891 Personal history of nicotine dependence: Secondary | ICD-10-CM

## 2023-01-16 DIAGNOSIS — Z8249 Family history of ischemic heart disease and other diseases of the circulatory system: Secondary | ICD-10-CM | POA: Diagnosis not present

## 2023-01-16 DIAGNOSIS — E860 Dehydration: Secondary | ICD-10-CM | POA: Diagnosis present

## 2023-01-16 DIAGNOSIS — E785 Hyperlipidemia, unspecified: Secondary | ICD-10-CM | POA: Diagnosis present

## 2023-01-16 DIAGNOSIS — Z881 Allergy status to other antibiotic agents status: Secondary | ICD-10-CM

## 2023-01-16 DIAGNOSIS — R911 Solitary pulmonary nodule: Secondary | ICD-10-CM | POA: Diagnosis not present

## 2023-01-16 DIAGNOSIS — Z96641 Presence of right artificial hip joint: Secondary | ICD-10-CM | POA: Diagnosis present

## 2023-01-16 DIAGNOSIS — J189 Pneumonia, unspecified organism: Secondary | ICD-10-CM | POA: Diagnosis present

## 2023-01-16 DIAGNOSIS — R7302 Impaired glucose tolerance (oral): Secondary | ICD-10-CM | POA: Diagnosis present

## 2023-01-16 DIAGNOSIS — I959 Hypotension, unspecified: Secondary | ICD-10-CM | POA: Diagnosis present

## 2023-01-16 DIAGNOSIS — Z880 Allergy status to penicillin: Secondary | ICD-10-CM

## 2023-01-16 DIAGNOSIS — R0602 Shortness of breath: Secondary | ICD-10-CM | POA: Diagnosis not present

## 2023-01-16 DIAGNOSIS — R0609 Other forms of dyspnea: Secondary | ICD-10-CM | POA: Diagnosis not present

## 2023-01-16 DIAGNOSIS — D6959 Other secondary thrombocytopenia: Secondary | ICD-10-CM | POA: Diagnosis present

## 2023-01-16 DIAGNOSIS — J188 Other pneumonia, unspecified organism: Secondary | ICD-10-CM | POA: Diagnosis present

## 2023-01-16 DIAGNOSIS — J90 Pleural effusion, not elsewhere classified: Secondary | ICD-10-CM | POA: Diagnosis not present

## 2023-01-16 DIAGNOSIS — N179 Acute kidney failure, unspecified: Secondary | ICD-10-CM | POA: Diagnosis present

## 2023-01-16 DIAGNOSIS — Z888 Allergy status to other drugs, medicaments and biological substances status: Secondary | ICD-10-CM

## 2023-01-16 DIAGNOSIS — Z79899 Other long term (current) drug therapy: Secondary | ICD-10-CM

## 2023-01-16 DIAGNOSIS — R54 Age-related physical debility: Secondary | ICD-10-CM | POA: Diagnosis present

## 2023-01-16 DIAGNOSIS — Z8261 Family history of arthritis: Secondary | ICD-10-CM

## 2023-01-16 DIAGNOSIS — Z8719 Personal history of other diseases of the digestive system: Secondary | ICD-10-CM

## 2023-01-16 DIAGNOSIS — Z743 Need for continuous supervision: Secondary | ICD-10-CM | POA: Diagnosis not present

## 2023-01-16 LAB — COMPREHENSIVE METABOLIC PANEL
ALT: 27 U/L (ref 0–44)
AST: 35 U/L (ref 15–41)
Albumin: 3 g/dL — ABNORMAL LOW (ref 3.5–5.0)
Alkaline Phosphatase: 40 U/L (ref 38–126)
Anion gap: 11 (ref 5–15)
BUN: 35 mg/dL — ABNORMAL HIGH (ref 8–23)
CO2: 22 mmol/L (ref 22–32)
Calcium: 8.3 mg/dL — ABNORMAL LOW (ref 8.9–10.3)
Chloride: 100 mmol/L (ref 98–111)
Creatinine, Ser: 1.78 mg/dL — ABNORMAL HIGH (ref 0.44–1.00)
GFR, Estimated: 28 mL/min — ABNORMAL LOW (ref >60)
Glucose, Bld: 191 mg/dL — ABNORMAL HIGH (ref 70–99)
Potassium: 4.8 mmol/L (ref 3.5–5.1)
Sodium: 133 mmol/L — ABNORMAL LOW (ref 135–145)
Total Bilirubin: 0.7 mg/dL (ref 0.3–1.2)
Total Protein: 5.7 g/dL — ABNORMAL LOW (ref 6.5–8.1)

## 2023-01-16 LAB — CBC
HCT: 38.2 % (ref 36.0–46.0)
HCT: 38.1 % (ref 36.0–46.0)
Hemoglobin: 12.2 g/dL (ref 12.0–15.0)
Hemoglobin: 12.4 g/dL (ref 12.0–15.0)
MCH: 31.8 pg (ref 26.0–34.0)
MCH: 32.3 pg (ref 26.0–34.0)
MCHC: 31.9 g/dL (ref 30.0–36.0)
MCHC: 32.5 g/dL (ref 30.0–36.0)
MCV: 99.5 fL (ref 80.0–100.0)
MCV: 99.2 fL (ref 80.0–100.0)
Platelets: 346 10*3/uL (ref 150–400)
Platelets: 320 10*3/uL (ref 150–400)
RBC: 3.84 MIL/uL — ABNORMAL LOW (ref 3.87–5.11)
RBC: 3.84 MIL/uL — ABNORMAL LOW (ref 3.87–5.11)
RDW: 15.5 % (ref 11.5–15.5)
RDW: 15.4 % (ref 11.5–15.5)
WBC: 19.6 10*3/uL — ABNORMAL HIGH (ref 4.0–10.5)
WBC: 19.9 10*3/uL — ABNORMAL HIGH (ref 4.0–10.5)
nRBC: 0.1 % (ref 0.0–0.2)
nRBC: 0.2 % (ref 0.0–0.2)

## 2023-01-16 LAB — RESP PANEL BY RT-PCR (RSV, FLU A&B, COVID)  RVPGX2
Influenza A by PCR: NEGATIVE
Influenza B by PCR: NEGATIVE
Resp Syncytial Virus by PCR: NEGATIVE
SARS Coronavirus 2 by RT PCR: NEGATIVE

## 2023-01-16 LAB — LACTIC ACID, PLASMA: Lactic Acid, Venous: 2.5 mmol/L (ref 0.5–1.9)

## 2023-01-16 LAB — D-DIMER, QUANTITATIVE: D-Dimer, Quant: 3.03 ug/mL-FEU — ABNORMAL HIGH (ref 0.00–0.50)

## 2023-01-16 LAB — TROPONIN I (HIGH SENSITIVITY): Troponin I (High Sensitivity): 207 ng/L (ref <18)

## 2023-01-16 LAB — CREATININE, SERUM
Creatinine, Ser: 1.44 mg/dL — ABNORMAL HIGH (ref 0.44–1.00)
GFR, Estimated: 36 mL/min — ABNORMAL LOW (ref >60)

## 2023-01-16 LAB — BRAIN NATRIURETIC PEPTIDE: B Natriuretic Peptide: 191.5 pg/mL — ABNORMAL HIGH (ref 0.0–100.0)

## 2023-01-16 LAB — TSH: TSH: 0.569 u[IU]/mL (ref 0.350–4.500)

## 2023-01-16 LAB — PROCALCITONIN: Procalcitonin: 0.72 ng/mL

## 2023-01-16 LAB — BLOOD GAS, ARTERIAL

## 2023-01-16 MED ORDER — HEPARIN SODIUM (PORCINE) 5000 UNIT/ML IJ SOLN
5000.0000 [IU] | Freq: Three times a day (TID) | INTRAMUSCULAR | Status: DC
  Administered 2023-01-16: 5000 [IU] via SUBCUTANEOUS
  Filled 2023-01-16: qty 1

## 2023-01-16 MED ORDER — SODIUM CHLORIDE 0.9 % IV SOLN
500.0000 mg | INTRAVENOUS | Status: DC
  Administered 2023-01-17: 500 mg via INTRAVENOUS
  Filled 2023-01-16: qty 5

## 2023-01-16 MED ORDER — SODIUM CHLORIDE 0.9 % IV SOLN: 2.0000 g | INTRAVENOUS | Status: DC

## 2023-01-16 MED ORDER — ONDANSETRON HCL 4 MG PO TABS: 4.0000 mg | ORAL_TABLET | Freq: Four times a day (QID) | ORAL | Status: DC | PRN

## 2023-01-16 MED ORDER — ACETAMINOPHEN 650 MG RE SUPP: 650.0000 mg | Freq: Four times a day (QID) | RECTAL | Status: DC | PRN

## 2023-01-16 MED ORDER — ONDANSETRON HCL 4 MG/2ML IJ SOLN: 4.0000 mg | Freq: Four times a day (QID) | INTRAMUSCULAR | Status: DC | PRN

## 2023-01-16 MED ORDER — FUROSEMIDE 10 MG/ML IJ SOLN
40.0000 mg | Freq: Once | INTRAMUSCULAR | Status: AC
  Administered 2023-01-16: 40 mg via INTRAVENOUS
  Filled 2023-01-16: qty 4

## 2023-01-16 MED ORDER — SODIUM CHLORIDE 0.9 % IV SOLN
1.0000 g | Freq: Once | INTRAVENOUS | Status: AC
  Administered 2023-01-16: 1 g via INTRAVENOUS
  Filled 2023-01-16: qty 10

## 2023-01-16 MED ORDER — SODIUM CHLORIDE 0.9 % IV SOLN
1000.0000 mL | INTRAVENOUS | Status: DC
  Administered 2023-01-16: 1000 mL via INTRAVENOUS

## 2023-01-16 MED ORDER — ALBUTEROL SULFATE (2.5 MG/3ML) 0.083% IN NEBU: 2.5000 mg | INHALATION_SOLUTION | RESPIRATORY_TRACT | Status: DC | PRN

## 2023-01-16 MED ORDER — SODIUM CHLORIDE 0.9 % IV SOLN
500.0000 mg | Freq: Once | INTRAVENOUS | Status: AC
  Administered 2023-01-16: 500 mg via INTRAVENOUS
  Filled 2023-01-16: qty 5

## 2023-01-16 MED ORDER — SODIUM CHLORIDE 0.9 % IV BOLUS (SEPSIS)
500.0000 mL | Freq: Once | INTRAVENOUS | Status: AC
  Administered 2023-01-16: 500 mL via INTRAVENOUS

## 2023-01-16 MED ORDER — IPRATROPIUM-ALBUTEROL 0.5-2.5 (3) MG/3ML IN SOLN: 3.0000 mL | RESPIRATORY_TRACT | Status: DC | PRN

## 2023-01-16 MED ORDER — CHLORHEXIDINE GLUCONATE CLOTH 2 % EX PADS
6.0000 | MEDICATED_PAD | Freq: Every day | CUTANEOUS | Status: DC
  Administered 2023-01-17: 6 via TOPICAL

## 2023-01-16 MED ORDER — ACETAMINOPHEN 325 MG PO TABS
650.0000 mg | ORAL_TABLET | Freq: Four times a day (QID) | ORAL | Status: DC | PRN
  Administered 2023-01-16: 650 mg via ORAL
  Filled 2023-01-16: qty 2

## 2023-01-16 MED ORDER — SODIUM CHLORIDE 0.9 % IV SOLN: INTRAVENOUS | Status: DC

## 2023-01-16 MED ORDER — SODIUM CHLORIDE 0.9 % IV BOLUS
500.0000 mL | Freq: Once | INTRAVENOUS | Status: AC
  Administered 2023-01-17: 500 mL via INTRAVENOUS

## 2023-01-16 NOTE — ED Provider Notes (Signed)
[place] EMERGENCY DEPARTMENT AT [facility] Provider Note   CSN: [id number] Arrival date & time: 01/16/23  4098     History  Chief Complaint  Patient presents with   Shortness of Breath    Nicole Keith is a 82 y.o. female.   Shortness of Breath    Patient has a history of colitis, arthritis, reflux, diverticulitis, multiple myeloma.  Patient is undergoing treatment involving dexamethasone and carfilzomib.  Patient's last treatment was yesterday.  Patient started feeling feverish yesterday.  She measured temperatures up to 102.  She also began feeling short of breath.  Whenever she takes a deep breath she has to cough.  She denies any leg swelling.  Home Medications Prior to Admission medications   Medication Sig Start Date End Date Taking? Authorizing Provider  acetaminophen (TYLENOL) 500 MG tablet Take 500 mg by mouth in the morning, at noon, and at bedtime.     [provider]  acyclovir (ZOVIRAX) 400 MG tablet Take 1 tablet (400 mg total) by mouth 2 (two) times daily. 01/15/23   Jaci Standard, MD  diphenhydramine-acetaminophen (TYLENOL PM) 25-500 MG TABS tablet Take 2 tablets by mouth at bedtime as needed (for sleep).     [provider]  famotidine-calcium carbonate-magnesium hydroxide (PEPCID COMPLETE) 10-800-165 MG chewable tablet Chew 1 tablet by mouth daily as needed (for heartburn or indigestion).     [provider]  ibuprofen (ADVIL) 400 MG tablet Take 400 mg by mouth every 6 (six) hours.    [provider]  triamcinolone cream (KENALOG) 0.1 % Apply 1 Application topically 2 (two) times daily. 11/20/22   Jaci Standard, MD      Allergies    Pomalyst [pomalidomide], Revlimid [lenalidomide], Betadine [povidone iodine], Flagyl [metronidazole], Fosamax [alendronate sodium], Bactrim [sulfamethoxazole-trimethoprim], Clindamycin/lincomycin, Doxepin hcl, Hydrocodone-acetaminophen, Hydroxyzine, Hydroxyzine hcl, Tramadol  hcl, Valium [diazepam], Actonel [risedronate sodium], Boniva [ibandronic acid], Doxycycline, and Penicillins    Review of Systems   Review of Systems  Respiratory:  Positive for shortness of breath.     Physical Exam Updated Vital Signs BP 126/73   Pulse 78   Temp 98.1 F (36.7 C) (Oral)   Resp 19   Wt 57.6 kg   SpO2 95%   BMI 20.50 kg/m  Physical Exam Vitals and nursing note reviewed.  Constitutional:      Appearance: She is well-developed. She is not diaphoretic.  HENT:     Head: Normocephalic and atraumatic.     Right Ear: External ear normal.     Left Ear: External ear normal.  Eyes:     General: No scleral icterus.       Right eye: No discharge.        Left eye: No discharge.     Conjunctiva/sclera: Conjunctivae normal.  Neck:     Trachea: No tracheal deviation.  Cardiovascular:     Rate and Rhythm: Normal rate and regular rhythm.  Pulmonary:     Effort: Pulmonary effort is normal. No respiratory distress.     Breath sounds: No stridor. Rales present. No wheezing.     Comments: Patient does have a new oxygen requirement Abdominal:     General: Bowel sounds are normal. There is no distension.     Palpations: Abdomen is soft.     Tenderness: There is no abdominal tenderness. There is no guarding or rebound.  Musculoskeletal:        General: No tenderness or deformity.  Cervical back: Neck supple.     Right lower leg: No tenderness. No edema.     Left lower leg: No tenderness. No edema.  Skin:    General: Skin is warm and dry.     Findings: No rash.  Neurological:     General: No focal deficit present.     Mental Status: She is alert.     Cranial Nerves: No cranial nerve deficit, dysarthria or facial asymmetry.     Sensory: No sensory deficit.     Motor: No abnormal muscle tone or seizure activity.     Coordination: Coordination normal.  Psychiatric:        Mood and Affect: Mood normal.     ED Results / Procedures / Treatments   Labs (all labs  ordered are listed, but only abnormal results are displayed) Labs Reviewed  COMPREHENSIVE METABOLIC PANEL - Abnormal; Notable for the following components:      Result Value   Sodium 133 (*)    Glucose, Bld 191 (*)    BUN 35 (*)    Creatinine, Ser 1.78 (*)    Calcium 8.3 (*)    Total Protein 5.7 (*)    Albumin 3.0 (*)    GFR, Estimated 28 (*)    All other components within normal limits  CBC - Abnormal; Notable for the following components:   WBC 19.6 (*)    RBC 3.84 (*)    All other components within normal limits  BRAIN NATRIURETIC PEPTIDE - Abnormal; Notable for the following components:   B Natriuretic Peptide 191.5 (*)    All other components within normal limits  RESP PANEL BY RT-PCR (RSV, FLU A&B, COVID)  [id number]    EKG EKG Interpretation  Date/Time:  Friday January 16 2023 17:46:51 EDT Ventricular Rate:  79 PR Interval:  156 QRS Duration: 81 QT Interval:  382 QTC Calculation: 438 R Axis:   103 Text Interpretation: Sinus rhythm Confirmed by Linwood Dibbles [phone]) on 01/16/2023 5:48:56 PM  Radiology DG Chest Port 1 View  Result Date: 01/16/2023 CLINICAL DATA:  Cough and shortness of breath. EXAM: PORTABLE CHEST 1 VIEW COMPARISON:  Chest radiograph 07/16/2017, chest from bone survey 11/03/2022 FINDINGS: Chronic hyperinflation. The heart is upper normal in size. Stable mediastinal contours. Increasing opacity at the periphery of the lung bases may represent ill-defined airspace disease and small effusions. No pulmonary edema. No pneumothorax. Patient's known bone lesions are not well characterized on the current exam. IMPRESSION: 1. Increasing opacity at the periphery of the lung bases may represent ill-defined airspace disease and small effusions. 2. Chronic hyperinflation. Electronically Signed   By: Narda Rutherford M.D.   On: 01/16/2023 18:18    Procedures Procedures    Medications Ordered in ED Medications  cefTRIAXone (ROCEPHIN) 1 g in sodium chloride 0.9 % 100 mL IVPB (1  g Intravenous New Bag/Given 01/16/23 1949)  azithromycin (ZITHROMAX) 500 mg in sodium chloride 0.9 % 250 mL IVPB (has no administration in time range)  sodium chloride 0.9 % bolus 500 mL (500 mLs Intravenous New Bag/Given 01/16/23 1950)    Followed by  0.9 %  sodium chloride infusion (has no administration in time range)    ED Course/ Medical Decision Making/ A&P Clinical Course as of 01/16/23 2004  Fri Jan 16, 2023  1914 Chest x-ray shows an increasing opacity at the periphery of the lung bases represent airspace disease and small effusions [JK]  1914 CBC shows a leukocytosis.  COVID-negative. [JK]  1915 Comprehensive metabolic  panel(!) metabolic panel does shows elevated BUN/creatinine consistent with dehydration [JK]  2003 Case discussed with Dr Maisie Fus regarding admission [JK]    Clinical Course User Index [JK] Linwood Dibbles, MD                             Medical Decision Making Differential diagnosis includes but not limited to, pneumonia, pneumothorax, CHF, pulm embolism  Problems Addressed: Dehydration: acute illness or injury that poses a threat to life or bodily functions Pneumonia due to infectious organism, unspecified laterality, unspecified part of lung: acute illness or injury that poses a threat to life or bodily functions  Amount and/or Complexity of Data Reviewed Labs: ordered. Decision-making details documented in ED Course. Radiology: ordered and independent interpretation performed.  Risk Prescription drug management. Decision regarding hospitalization.   Patient presented to the ED for evaluation of shortness of breath cough fever.  Patient undergoing chemotherapy for multiple myeloma.  Patient does have a new oxygen requirement.  She is currently stable on 6 L nasal cannula oxygen 95%.  She is not in any respiratory distress.  Her x-ray does show evidence of pneumonia.  Patient does have leukocytosis.  Her laboratory tests also show component of dehydration with  elevated creatinine.  Patient has been started on IV fluids.  Have ordered IV antibiotics.  I will consult the medical service for admission        Final Clinical Impression(s) / ED Diagnoses Final diagnoses:  Pneumonia due to infectious organism, unspecified laterality, unspecified part of lung  Dehydration    Rx / DC Orders ED Discharge Orders     None         Linwood Dibbles, MD 01/16/23 2004

## 2023-01-16 NOTE — Progress Notes (Signed)
ABG collected and send down to lab for analysis. Lab notified.  

## 2023-01-16 NOTE — H&P (Addendum)
History and Physical    SHA BOURDON ZOX:[id number] DOB: 08/13/1940 DOA: 01/16/2023  PCP: Farris Has, MD  Patient coming from: home I have personally briefly reviewed patient's old medical records in [facility]  Chief Complaint: sob,cough, pleuritic chest pain x 1 day  HPI: Nicole Keith is a 82 y.o. female with medical history significant of diverticular disease, collagenous colitis, GERD, HLD, Multiple Myeloma on chemo/immune therapy last infusion 01/15/23(Kyprolis) followed by Dr Leonides Schanz. Patient presents to ED with one day history of sob associated with cough and pleuritic chest pain.  Patient states she has had intermittent sob over the last 2 weeks after starting chemo. She notes however after her last chemo one day ago her sob more severe and persistent and she develop a cough. She notes that she also has a band like pain around her mid chest that makes it difficult for her to breath. She notes associated fever and chills starting yesterday. She notes no n/v/d/ dysuria or abdominal pain.     ED Course:  Vitals: afeb, bp 124/71, hr 83, rr 26 sat 92%  Respiratory panel: negative  WJX:BJYNWGNFAO: 1. Increasing opacity at the periphery of the lung bases may represent ill-defined airspace disease and small effusions. 2. Chronic hyperinflation.  Wbc: 19.6 (8.1), hgb 12.2,  plt 346  Na 133, K 4.8, glu 191, cr 1.78(0.83) BNP 191.5  Tx ctx/azithromycin,ns 500cc    Review of Systems: As per HPI otherwise 10 point review of systems negative.   Past Medical History:  Diagnosis Date   Allergic rhinitis    Arthritis    Colitis, collagenous    Diverticular disease    severe sig tics/fixation 2012   Family hx of colon cancer    Fracture of femoral neck, right (HCC) 01/09/2013   GERD (gastroesophageal reflux disease)    exacerbation of reflux-like symptoms 04/2011   Hiatal hernia    Hyperlipidemia    Osteoarthritis of right hip 01/10/2013    Past Surgical History:   Procedure Laterality Date   CHOLECYSTECTOMY     EYE SURGERY  feb 2016   cataract right eye   LEG SURGERY Left    TONSILLECTOMY     TOTAL HIP ARTHROPLASTY Right 01/10/2013   Procedure: TOTAL HIP ARTHROPLASTY;  Surgeon: Eulas Post, MD;  Location: [facility] OR;  Service: Orthopedics;  Laterality: Right;     reports that she quit smoking about 51 years ago. She has never used smokeless tobacco. She reports that she does not drink alcohol and does not use drugs.  Allergies  Allergen Reactions   Pomalyst [Pomalidomide] Rash and Other (See Comments)    ENTIRE BODY BROKE OUT IN A RASH   Revlimid [Lenalidomide] Rash and Other (See Comments)    WHOLE BODY BLISTERED   Betadine [Povidone Iodine] Rash   Flagyl [Metronidazole] Diarrhea   Fosamax [Alendronate Sodium] Other (See Comments)    GI distress   Bactrim [Sulfamethoxazole-Trimethoprim] Nausea Only   Clindamycin/Lincomycin     Pt can't remember reaction type.   Doxepin Hcl Other (See Comments)    Irritable, hallucinations   Hydrocodone-Acetaminophen     Restlessness   Hydroxyzine Other (See Comments)    Hallucinations   Hydroxyzine Hcl Other (See Comments)    Hallucinations    Tramadol Hcl    Valium [Diazepam] Other (See Comments)    Reaction not recalled   Actonel [Risedronate Sodium] Other (See Comments)    GI distress   Boniva [Ibandronic Acid] Other (See Comments)  GI distress   Doxycycline Rash   Penicillins Rash    Ancef was OK    Family History  Problem Relation Age of Onset   Congestive Heart Failure Mother    Arthritis-Osteo Mother    Colon cancer Mother    Heart attack Father    Rheum arthritis Father    Colon cancer Maternal Aunt    Colon cancer Maternal Aunt    Hypothyroidism Sister     Prior to Admission medications   Medication Sig Start Date End Date Taking? Authorizing Provider  acetaminophen (TYLENOL) 500 MG tablet Take 500 mg by mouth in the morning, at noon, and at bedtime.     [provider]  acyclovir (ZOVIRAX) 400 MG tablet Take 1 tablet (400 mg total) by mouth 2 (two) times daily. 01/15/23   Jaci Standard, MD  diphenhydramine-acetaminophen (TYLENOL PM) 25-500 MG TABS tablet Take 2 tablets by mouth at bedtime as needed (for sleep).     [provider]  famotidine-calcium carbonate-magnesium hydroxide (PEPCID COMPLETE) 10-800-165 MG chewable tablet Chew 1 tablet by mouth daily as needed (for heartburn or indigestion).     [provider]  ibuprofen (ADVIL) 400 MG tablet Take 400 mg by mouth every 6 (six) hours.    [provider]  triamcinolone cream (KENALOG) 0.1 % Apply 1 Application topically 2 (two) times daily. 11/20/22   Jaci Standard, MD    Physical Exam: Vitals:   01/16/23 1653 01/16/23 1654 01/16/23 1830  BP: 124/71  126/73  Pulse: 83  78  Resp: (!) 26  19  Temp: 98.1 F (36.7 C)    TempSrc: Oral    SpO2: 92%  95%  Weight:  57.6 kg     Constitutional: + conversation dyspnea, appear weak and fatigued Vitals:   01/16/23 1653 01/16/23 1654 01/16/23 1830  BP: 124/71  126/73  Pulse: 83  78  Resp: (!) 26  19  Temp: 98.1 F (36.7 C)    TempSrc: Oral    SpO2: 92%  95%  Weight:  57.6 kg    Eyes: PERRL, lids and conjunctivae normal ENMT: Mucous membranes are moist. Posterior pharynx clear of any exudate or lesions.Normal dentition.  Neck: normal, supple, no masses, no thyromegaly Respiratory: + crackles at bases. Increase respiratory effort. + accessory muscle use.  Cardiovascular: Regular rate and rhythm, no murmurs / rubs / gallops. No extremity edema. 2+ pedal pulses. No carotid bruits.  Abdomen: no tenderness, no masses palpated. No hepatosplenomegaly. Bowel sounds positive.  Musculoskeletal: no clubbing / cyanosis. No joint deformity upper and lower extremities. Good ROM, no contractures. Normal muscle tone.  Skin: no rashes, lesions, ulcers. No induration Neurologic: CN 2-12 grossly intact. Sensation intact,l.  Strength 5/5 in all 4.  Psychiatric: Normal judgment and insight. Alert and oriented x 3. Normal mood.    Labs on Admission: I have personally reviewed following labs and imaging studies  CBC: Recent Labs  Lab 01/15/23 0745 01/16/23 1829  WBC 8.1 19.6*  NEUTROABS 5.1  --   HGB 13.0 12.2  HCT 38.8 38.2  MCV 97.2 99.5  PLT 566* 346   Basic Metabolic Panel: Recent Labs  Lab 01/15/23 0745 01/16/23 1829  NA 139 133*  K 4.3 4.8  CL 105 100  CO2 28 22  GLUCOSE 111* 191*  BUN 9 35*  CREATININE 0.83 1.78*  CALCIUM 8.8* 8.3*   GFR: Estimated Creatinine Clearance: 22.2 mL/min (A) (by C-G formula based on SCr of  1.78 mg/dL (H)). Liver Function Tests: Recent Labs  Lab 01/15/23 0745 01/16/23 1829  AST 19 35  ALT 17 27  ALKPHOS 56 40  BILITOT 0.8 0.7  PROT 6.3* 5.7*  ALBUMIN 3.9 3.0*   No results for input(s): "LIPASE", "AMYLASE" in the last 168 hours. No results for input(s): "AMMONIA" in the last 168 hours. Coagulation Profile: No results for input(s): "INR", "PROTIME" in the last 168 hours. Cardiac Enzymes: No results for input(s): "CKTOTAL", "CKMB", "CKMBINDEX", "TROPONINI" in the last 168 hours. BNP (last 3 results) No results for input(s): "PROBNP" in the last 8760 hours. HbA1C: No results for input(s): "HGBA1C" in the last 72 hours. CBG: No results for input(s): "GLUCAP" in the last 168 hours. Lipid Profile: No results for input(s): "CHOL", "HDL", "LDLCALC", "TRIG", "CHOLHDL", "LDLDIRECT" in the last 72 hours. Thyroid Function Tests: No results for input(s): "TSH", "T4TOTAL", "FREET4", "T3FREE", "THYROIDAB" in the last 72 hours. Anemia Panel: No results for input(s): "VITAMINB12", "FOLATE", "FERRITIN", "TIBC", "IRON", "RETICCTPCT" in the last 72 hours. Urine analysis:    Component Value Date/Time   COLORURINE YELLOW 01/09/2013 1828   APPEARANCEUR CLEAR 01/09/2013 1828   LABSPEC 1.012 01/09/2013 1828   PHURINE 7.0 01/09/2013 1828   GLUCOSEU NEGATIVE  01/09/2013 1828   HGBUR NEGATIVE 01/09/2013 1828   BILIRUBINUR NEGATIVE 01/09/2013 1828   KETONESUR NEGATIVE 01/09/2013 1828   PROTEINUR NEGATIVE 01/09/2013 1828   UROBILINOGEN 0.2 01/09/2013 1828   NITRITE NEGATIVE 01/09/2013 1828   LEUKOCYTESUR NEGATIVE 01/09/2013 1828    Radiological Exams on Admission: DG Chest Port 1 View  Result Date: 01/16/2023 CLINICAL DATA:  Cough and shortness of breath. EXAM: PORTABLE CHEST 1 VIEW COMPARISON:  Chest radiograph 07/16/2017, chest from bone survey 11/03/2022 FINDINGS: Chronic hyperinflation. The heart is upper normal in size. Stable mediastinal contours. Increasing opacity at the periphery of the lung bases may represent ill-defined airspace disease and small effusions. No pulmonary edema. No pneumothorax. Patient's known bone lesions are not well characterized on the current exam. IMPRESSION: 1. Increasing opacity at the periphery of the lung bases may represent ill-defined airspace disease and small effusions. 2. Chronic hyperinflation. Electronically Signed   By: Narda Rutherford M.D.   On: 01/16/2023 18:18    EKG: Independently reviewed. pending  Assessment/Plan  CAP presumed bacterial associated with Acute hypoxic respiratory failure -patient with increase wbc and  Opacities on cxr  -zosyn/ azithromycin, de-escalate as able  -pulmonary toilet  -wean O2 as able  -urine ag, sputum, f/u on culture data   -ct scan  notes multifocal pna /aspiration -f/u on inflammatory markers   D-dimer elevated -concern for PE - lovenox x 1 per critical care  -will need CTPE in am to de-escalate therapy   Abn CE  -presumed due to demand  -cycle ce  -will continue to monitor ce over night to be complete, echo ordered for am    AKI -improving on repeat  - got bolus as well as lasix  -hold nephrotoxic medications  -monitor trend    Multiple Myeloma  -on chemo/immune therapy last infusion 01/15/23  -? Current symptoms related to recent infusion  -  will consult oncology for further assistance    GERD -ppi  HLD -resume statin    DVT prophylaxis: heparin Code Status: full/ as discussed per patient wishes in event of cardiac arrest  Family Communication: none at bedside Disposition Plan: patient  expected to be admitted greater than 2 midnights  Consults called: Oncology ,critical care Admission status: progressive  Lurline Del MD [facility]   If 7PM-7AM, please contact night-coverage [email or web address] Password [facility]  01/16/2023, 8:23 PM

## 2023-01-16 NOTE — Progress Notes (Incomplete)
ANTICOAGULATION CONSULT NOTE - Initial Consult  Pharmacy Consult for Heparin Indication: pulmonary embolus  Allergies  Allergen Reactions  . Kyprolis [Carfilzomib] Shortness Of Breath and Other (See Comments)    Has now started experiencing pneumonia-alike symptoms  . Pomalyst [Pomalidomide] Rash and Other (See Comments)    ENTIRE BODY BROKE OUT IN A RASH  . Revlimid [Lenalidomide] Rash and Other (See Comments)    WHOLE BODY BLISTERED  . Betadine [Povidone Iodine] Rash  . Flagyl [Metronidazole] Diarrhea  . Fosamax [Alendronate Sodium] Other (See Comments)    GI distress  . Bactrim [Sulfamethoxazole-Trimethoprim] Nausea Only  . Clindamycin/Lincomycin     Pt can't remember reaction type.  . Doxepin Hcl Other (See Comments)    Irritable, hallucinations  . Hydrocodone-Acetaminophen     Restlessness  . Hydroxyzine Other (See Comments)    Hallucinations  . Hydroxyzine Hcl Other (See Comments)    Hallucinations   . Tramadol Hcl Other (See Comments)    Reaction not noted  . Valium [Diazepam] Other (See Comments)    Reaction not recalled  . Actonel [Risedronate Sodium] Other (See Comments)    GI distress  . Boniva [Ibandronic Acid] Other (See Comments)    GI distress  . Doxycycline Rash  . Penicillins Rash    Ancef was OK    Patient Measurements: Weight: 57.6 kg (127 lb) Heparin Dosing Weight: TBW  Vital Signs: Temp: 98 F (36.7 C) (06/07 2045) Temp Source: Oral (06/07 2045) BP: 126/73 (06/07 1830) Pulse Rate: 78 (06/07 1830)  Labs: Recent Labs    01/15/23 0745 01/16/23 1829 01/16/23 2019 01/16/23 2126  HGB 13.0 12.2 12.4  --   HCT 38.8 38.2 38.1  --   PLT 566* 346 320  --   CREATININE 0.83 1.78* 1.44*  --   TROPONINIHS  --   --   --  207*    Estimated Creatinine Clearance: 27.4 mL/min (A) (by C-G formula based on SCr of 1.44 mg/dL (H)).   Medical History: Past Medical History:  Diagnosis Date  . Allergic rhinitis   . Arthritis   . Colitis,  collagenous   . Diverticular disease    severe sig tics/fixation 2012  . Family hx of colon cancer   . Fracture of femoral neck, right (HCC) 01/09/2013  . GERD (gastroesophageal reflux disease)    exacerbation of reflux-like symptoms 04/2011  . Hiatal hernia   . Hyperlipidemia   . muliplt myelom 2020  . Osteoarthritis of right hip 01/10/2013    Medications:  Infusions:  . [START ON 01/17/2023] azithromycin    . [START ON 01/17/2023] cefTRIAXone (ROCEPHIN)  IV      Assessment: 82 yo F presented with chest pain & shortness of breath x1 day.   PMH significant for multiple myeloma undergoing chemotherapy- last infusion 6/6.   CBC WNL. DDimer elevated.  Pharmacy consulted to dose IV heparin.  Not on anticoagulation PTA.   Chest CT negative for PE.  Troponin elevated.  Goal of Therapy:  Heparin level 0.3-0.7 units/ml Monitor platelets by anticoagulation protocol: Yes   Plan:  Give 2000 units bolus x 1 Start heparin infusion at 900 units/hr Check anti-Xa level in 8 hours and daily while on heparin Continue to monitor H&H and platelets  Junita Push PharmD 01/16/2023,10:32 PM

## 2023-01-16 NOTE — Progress Notes (Incomplete)
eLink Physician-Brief Progress Note Patient Name: Nicole Keith DOB: 10/02/1940 MRN: [id number]   Date of Service  01/16/2023  HPI/Events of Note  82 year old history of gastroesophageal reflux disease, osteoarthritis, and multiple myeloma Kyprolis (Carfilzomib,protease inhibitor, not commonly associated with pulmonary toxicity) who initially presented from home with dyspnea after chemoinfusion with saturations in the mid 80s requiring 6 L of oxygen.  On presentation, she was tachypneic and mildly hypotensive.  She was saturating 89-91% on 12 L high flow nasal cannula.  Initial ABG consistent with severe hypoxemia.  Metabolic panel consistent with elevated creatinine, minimally elevated BNP, somewhat elevated troponin 207.  She has leukocytosis.  CT chest without contrast consistent with bilateral lower lobe consolidations   1. Bilateral posterior basal lower lobe consolidation with endobronchial debris which may reflect changes of superimposed multifocal infection or aspiration. 2. Small partially loculated posterobasal pleural effusions. 3. Moderate multi-vessel coronary artery calcification. 4. Pulmonary hyperinflation in keeping with probable air trapping in keeping with COPD. 5. Expansile lytic mass within the anterior right seventh rib with significant soft tissue component measuring 4.2 x 5.1 cm. There is also expansile changes involving the anterior left seventh and sixth ribs. Similarly, lytic lesions have developed within the thoracic spine, most notably at T7. Findings are in keeping with the patient's underlying myeloma. 6. Progressive loss of height of the T8 and T9 vertebral bodies with accentuated thoracic kyphosis. 7. Remote healed sternal manubrial fracture. Remote L1 superior and inferior endplate fractures. 8. Lobulated subpleural pulmonary nodule  eICU Interventions       Intervention Category Evaluation Type: New Patient Evaluation  [name] 01/16/2023, 11:55 PM

## 2023-01-16 NOTE — Progress Notes (Addendum)
eLink Physician-Brief Progress Note Patient Name: Nicole Keith DOB: 01-29-1941 MRN: [id number]   Date of Service  01/16/2023  HPI/Events of Note  82 year old history of gastroesophageal reflux disease, osteoarthritis, and multiple myeloma Kyprolis (Carfilzomib,protease inhibitor, not commonly associated with pulmonary toxicity) who initially presented from home with dyspnea after chemoinfusion with saturations in the mid 80s requiring 6 L of oxygen.  On presentation, she was tachypneic and mildly hypotensive.  She was saturating 89-91% on 12 L high flow nasal cannula.  Initial ABG consistent with severe hypoxemia.  Metabolic panel consistent with elevated creatinine, minimally elevated BNP, somewhat elevated troponin 207.  She has leukocytosis.  D-dimer elevated at 3.03 procalcitonin equivocal at 0.72.  CT chest without contrast consistent with bilateral lower lobe consolidations with appropriate air bronchograms.  Hyperinflation present known mass within the rib and multilevel degenerative disc disease.   eICU Interventions  Will add on scheduled SVNs  Expand from ceftriaxone to Zosyn in the setting of immunocompromise state.  Maintain azithromycin.  Trend troponins, lactic, repeat ABG in the morning.  Add on coagulopathy labs for the morning.  Obtain arterial line  Holding CT angiography in the setting of AKI, but can administer empiric one-time dose of enoxaparin with moderate to high pretest probability of VTE with elevated Dimer  Maintain euvolemia, discontinue continuous IVF.  Received Lasix earlier this. Reassess AKI with AM labs.  GI prophylaxis not indicated.  DVT prophylaxis with temporary therapeutic enoxaparin   0532 -troponin downtrending, lactic stable, labs generally stable.  Can defer A-line for now.  Repeat arterial gas at noon.  Daily labs ordered.  Intervention Category Evaluation Type: New Patient Evaluation  [name] 01/16/2023, 11:55 PM

## 2023-01-16 NOTE — ED Notes (Signed)
ED TO INPATIENT HANDOFF REPORT  ED Nurse Name and Phone #: Linus Orn Name/Age/Gender Nicole Keith 82 y.o. female Room/Bed: WA01/WA01  Code Status   Code Status: Full Code  Home/SNF/Other Home Patient oriented to: self, place, time, and situation Is this baseline? Yes   Triage Complete: Triage complete  Chief Complaint CAP (community acquired pneumonia) [J18.9]  Triage Note Pt arrives from home via EMS for c/o SOB since yesterday after chemo infusion. Per EMS pt RA sats mid 80s, requiring 6L [place]. Endorses difficulty taking a deep breath. Febrile yesterday.   Allergies Allergies  Allergen Reactions   Kyprolis [Carfilzomib] Shortness Of Breath and Other (See Comments)    Has now started experiencing pneumonia-alike symptoms   Pomalyst [Pomalidomide] Rash and Other (See Comments)    ENTIRE BODY BROKE OUT IN A RASH   Revlimid [Lenalidomide] Rash and Other (See Comments)    WHOLE BODY BLISTERED   Betadine [Povidone Iodine] Rash   Flagyl [Metronidazole] Diarrhea   Fosamax [Alendronate Sodium] Other (See Comments)    GI distress   Bactrim [Sulfamethoxazole-Trimethoprim] Nausea Only   Clindamycin/Lincomycin     Pt can't remember reaction type.   Doxepin Hcl Other (See Comments)    Irritable, hallucinations   Hydrocodone-Acetaminophen     Restlessness   Hydroxyzine Other (See Comments)    Hallucinations   Hydroxyzine Hcl Other (See Comments)    Hallucinations    Tramadol Hcl Other (See Comments)    Reaction not noted   Valium [Diazepam] Other (See Comments)    Reaction not recalled   Actonel [Risedronate Sodium] Other (See Comments)    GI distress   Boniva [Ibandronic Acid] Other (See Comments)    GI distress   Doxycycline Rash   Penicillins Rash    Ancef was OK    Level of Care/Admitting Diagnosis ED Disposition     ED Disposition  Admit   Condition  --   Comment  Hospital Area: [facility] [[id number]]  Level of Care: Stepdown [14]   Admit to SDU based on following criteria: Respiratory Distress:  Frequent assessment and/or intervention to maintain adequate ventilation/respiration, pulmonary toilet, and respiratory treatment.  May admit patient to [facility] or [facility] if equivalent level of care is available:: Yes  Covid Evaluation: Confirmed COVID Negative  Diagnosis: CAP (community acquired pneumonia) [272536]  Admitting Physician: Lurline Del [[id number]]  Attending Physician: Lurline Del [[id number]]  Certification:: I certify there are rare and unusual circumstances requiring inpatient admission          B Medical/Surgery History Past Medical History:  Diagnosis Date   Allergic rhinitis    Arthritis    Colitis, collagenous    Diverticular disease    severe sig tics/fixation 2012   Family hx of colon cancer    Fracture of femoral neck, right (HCC) 01/09/2013   GERD (gastroesophageal reflux disease)    exacerbation of reflux-like symptoms 04/2011   Hiatal hernia    Hyperlipidemia    muliplt myelom 2020   Osteoarthritis of right hip 01/10/2013   Past Surgical History:  Procedure Laterality Date   CHOLECYSTECTOMY     EYE SURGERY  feb 2016   cataract right eye   LEG SURGERY Left    TONSILLECTOMY     TOTAL HIP ARTHROPLASTY Right 01/10/2013   Procedure: TOTAL HIP ARTHROPLASTY;  Surgeon: Eulas Post, MD;  Location: [facility] OR;  Service: Orthopedics;  Laterality: Right;     A IV Location/Drains/Wounds Patient Lines/Drains/Airways  Status     Active Line/Drains/Airways     Name Placement date Placement time Site Days   Peripheral IV 01/16/23 Right Antecubital 01/16/23  1811  Antecubital  less than 1            Intake/Output Last 24 hours No intake or output data in the 24 hours ending 01/16/23 2249  Labs/Imaging Results for orders placed or performed during the hospital encounter of 01/16/23 (from the past 48 hour(s))  Resp panel by RT-PCR (RSV, Flu A&B, Covid) Anterior Nasal  Swab     Status: None   Collection Time: 01/16/23  5:41 PM   Specimen: Anterior Nasal Swab  Result Value Ref Range   SARS Coronavirus 2 by RT PCR NEGATIVE NEGATIVE    Comment: (NOTE) SARS-CoV-2 target nucleic acids are NOT DETECTED.  The SARS-CoV-2 RNA is generally detectable in upper respiratory specimens during the acute phase of infection. The lowest concentration of SARS-CoV-2 viral copies this assay can detect is 138 copies/mL. A negative result does not preclude SARS-Cov-2 infection and should not be used as the sole basis for treatment or other patient management decisions. A negative result may occur with  improper specimen collection/handling, submission of specimen other than nasopharyngeal swab, presence of viral mutation(s) within the areas targeted by this assay, and inadequate number of viral copies(<138 copies/mL). A negative result must be combined with clinical observations, patient history, and epidemiological information. The expected result is Negative.  Fact Sheet for Patients:  BloggerCourse.com  Fact Sheet for Healthcare Providers:  SeriousBroker.it  This test is no t yet approved or cleared by the [place] FDA and  has been authorized for detection and/or diagnosis of SARS-CoV-2 by FDA under an Emergency Use Authorization (EUA). This EUA will remain  in effect (meaning this test can be used) for the duration of the COVID-19 declaration under Section 564(b)(1) of the Act, 21 U.S.C.section 360bbb-3(b)(1), unless the authorization is terminated  or revoked sooner.       Influenza A by PCR NEGATIVE NEGATIVE   Influenza B by PCR NEGATIVE NEGATIVE    Comment: (NOTE) The Xpert Xpress SARS-CoV-2/FLU/RSV plus assay is intended as an aid in the diagnosis of influenza from Nasopharyngeal swab specimens and should not be used as a sole basis for treatment. Nasal washings and aspirates are unacceptable for  Xpert Xpress SARS-CoV-2/FLU/RSV testing.  Fact Sheet for Patients: BloggerCourse.com  Fact Sheet for Healthcare Providers: SeriousBroker.it  This test is not yet approved or cleared by the [place] FDA and has been authorized for detection and/or diagnosis of SARS-CoV-2 by FDA under an Emergency Use Authorization (EUA). This EUA will remain in effect (meaning this test can be used) for the duration of the COVID-19 declaration under Section 564(b)(1) of the Act, 21 U.S.C. section 360bbb-3(b)(1), unless the authorization is terminated or revoked.     Resp Syncytial Virus by PCR NEGATIVE NEGATIVE    Comment: (NOTE) Fact Sheet for Patients: BloggerCourse.com  Fact Sheet for Healthcare Providers: SeriousBroker.it  This test is not yet approved or cleared by the [place] FDA and has been authorized for detection and/or diagnosis of SARS-CoV-2 by FDA under an Emergency Use Authorization (EUA). This EUA will remain in effect (meaning this test can be used) for the duration of the COVID-19 declaration under Section 564(b)(1) of the Act, 21 U.S.C. section 360bbb-3(b)(1), unless the authorization is terminated or revoked.  Performed at [facility], [address], [place], [place] [address]   Comprehensive metabolic panel  Status: Abnormal   Collection Time: 01/16/23  6:29 PM  Result Value Ref Range   Sodium 133 (L) 135 - 145 mmol/L   Potassium 4.8 3.5 - 5.1 mmol/L   Chloride 100 98 - 111 mmol/L   CO2 22 22 - 32 mmol/L   Glucose, Bld 191 (H) 70 - 99 mg/dL    Comment: Glucose reference range applies only to samples taken after fasting for at least 8 hours.   BUN 35 (H) 8 - 23 mg/dL   Creatinine, Ser 9.60 (H) 0.44 - 1.00 mg/dL   Calcium 8.3 (L) 8.9 - 10.3 mg/dL   Total Protein 5.7 (L) 6.5 - 8.1 g/dL   Albumin 3.0 (L) 3.5 - 5.0 g/dL   AST 35 15 - 41  U/L   ALT 27 0 - 44 U/L   Alkaline Phosphatase 40 38 - 126 U/L   Total Bilirubin 0.7 0.3 - 1.2 mg/dL   GFR, Estimated 28 (L) >60 mL/min    Comment: (NOTE) Calculated using the CKD-EPI Creatinine Equation (2021)    Anion gap 11 5 - 15    Comment: Performed at [facility], [address] [place], [place] [address]  CBC     Status: Abnormal   Collection Time: 01/16/23  6:29 PM  Result Value Ref Range   WBC 19.6 (H) 4.0 - 10.5 K/uL   RBC 3.84 (L) 3.87 - 5.11 MIL/uL   Hemoglobin 12.2 12.0 - 15.0 g/dL   HCT 81.1 91.4 - 78.2 %   MCV 99.5 80.0 - 100.0 fL   MCH 31.8 26.0 - 34.0 pg   MCHC 31.9 30.0 - 36.0 g/dL   RDW 95.6 21.3 - 08.6 %   Platelets 346 150 - 400 K/uL   nRBC 0.1 0.0 - 0.2 %    Comment: Performed at [facility], [address] [place], [place] [address]  Brain natriuretic peptide     Status: Abnormal   Collection Time: 01/16/23  6:30 PM  Result Value Ref Range   B Natriuretic Peptide 191.5 (H) 0.0 - 100.0 pg/mL    Comment: Performed at [facility], [address], [place], [place] [address]  CBC     Status: Abnormal   Collection Time: 01/16/23  8:19 PM  Result Value Ref Range   WBC 19.9 (H) 4.0 - 10.5 K/uL   RBC 3.84 (L) 3.87 - 5.11 MIL/uL   Hemoglobin 12.4 12.0 - 15.0 g/dL   HCT 28.4 13.2 - 44.0 %   MCV 99.2 80.0 - 100.0 fL   MCH 32.3 26.0 - 34.0 pg   MCHC 32.5 30.0 - 36.0 g/dL   RDW 10.2 72.5 - 36.6 %   Platelets 320 150 - 400 K/uL   nRBC 0.2 0.0 - 0.2 %    Comment: Performed at [facility], [address] [place], [place] [address]  Creatinine, serum     Status: Abnormal   Collection Time: 01/16/23  8:19 PM  Result Value Ref Range   Creatinine, Ser 1.44 (H) 0.44 - 1.00 mg/dL   GFR, Estimated 36 (L) >60 mL/min    Comment: (NOTE) Calculated using the CKD-EPI Creatinine Equation (2021) Performed at [facility], [address] [place], [place] [address]   TSH      Status: None   Collection Time: 01/16/23  8:23 PM  Result Value Ref Range   TSH 0.569 0.350 - 4.500 uIU/mL    Comment: Performed by a 3rd  Generation assay with a functional sensitivity of <=0.01 uIU/mL. Performed at [facility], [address] [place], [place] [address]   Troponin I (High Sensitivity)     Status: Abnormal   Collection Time: 01/16/23  9:26 PM  Result Value Ref Range   Troponin I (High Sensitivity) 207 (HH) <18 ng/L    Comment: CRITICAL RESULT CALLED TO, READ BACK BY AND VERIFIED WITH RIVERS, TJ RN @ 2209 01/16/23. GILBERTL (NOTE) Elevated high sensitivity troponin I (hsTnI) values and significant  changes across serial measurements may suggest ACS but many other  chronic and acute conditions are known to elevate hsTnI results.  Refer to the "Links" section for chest pain algorithms and additional  guidance. Performed at [facility], [address], [place], [place] [address]   D-dimer, quantitative     Status: Abnormal   Collection Time: 01/16/23  9:26 PM  Result Value Ref Range   D-Dimer, Quant 3.03 (H) 0.00 - 0.50 ug/mL-FEU    Comment: (NOTE) At the manufacturer cut-off value of 0.5 g/mL FEU, this assay has a negative predictive value of 95-100%.This assay is intended for use in conjunction with a clinical pretest probability (PTP) assessment model to exclude pulmonary embolism (PE) and deep venous thrombosis (DVT) in outpatients suspected of PE or DVT. Results should be correlated with clinical presentation. Performed at [facility], [address] [place], [place] [address]   Procalcitonin     Status: None   Collection Time: 01/16/23  9:26 PM  Result Value Ref Range   Procalcitonin 0.72 ng/mL    Comment:        Interpretation: PCT > 0.5 ng/mL and <= 2 ng/mL: Systemic infection (sepsis) is possible, but other conditions are known to elevate PCT as well. (NOTE)       Sepsis PCT Algorithm            Lower Respiratory Tract                                      Infection PCT Algorithm    ----------------------------     ----------------------------         PCT < 0.25 ng/mL                PCT < 0.10 ng/mL          Strongly encourage             Strongly discourage   discontinuation of antibiotics    initiation of antibiotics    ----------------------------     -----------------------------       PCT 0.25 - 0.50 ng/mL            PCT 0.10 - 0.25 ng/mL               OR       >80% decrease in PCT            Discourage initiation of                                            antibiotics      Encourage discontinuation           of antibiotics    ----------------------------     -----------------------------  PCT >= 0.50 ng/mL              PCT 0.26 - 0.50 ng/mL                AND       <80% decrease in PCT             Encourage initiation of                                             antibiotics       Encourage continuation           of antibiotics    ----------------------------     -----------------------------        PCT >= 0.50 ng/mL                  PCT > 0.50 ng/mL               AND         increase in PCT                  Strongly encourage                                      initiation of antibiotics    Strongly encourage escalation           of antibiotics                                     -----------------------------                                           PCT <= 0.25 ng/mL                                                 OR                                        > 80% decrease in PCT                                      Discontinue / Do not initiate                                             antibiotics  Performed at [facility], [address], [place], [place] [address]   Blood gas, arterial     Status: Abnormal   Collection Time: 01/16/23  9:57 PM  Result Value Ref Range   O2 Content 6.0 L/min   Mode NO CHARGE    pH,  Arterial 7.47 (H) 7.35 - 7.45   pCO2 arterial 28 (  L) 32 - 48 mmHg   pO2, Arterial 47 (L) 83 - 108 mmHg   Bicarbonate 19.9 (L) 20.0 - 28.0 mmol/L   Acid-base deficit 2.6 (H) 0.0 - 2.0 mmol/L   O2 Saturation 89 %   Patient temperature 36.7    Collection site RIGHT RADIAL    Drawn by [id number]    Allens test (pass/fail) PASS PASS    Comment: Performed at [facility], [address] [place], [place] [address]   DG Chest Port 1 View  Result Date: 01/16/2023 CLINICAL DATA:  Cough and shortness of breath. EXAM: PORTABLE CHEST 1 VIEW COMPARISON:  Chest radiograph 07/16/2017, chest from bone survey 11/03/2022 FINDINGS: Chronic hyperinflation. The heart is upper normal in size. Stable mediastinal contours. Increasing opacity at the periphery of the lung bases may represent ill-defined airspace disease and small effusions. No pulmonary edema. No pneumothorax. Patient's known bone lesions are not well characterized on the current exam. IMPRESSION: 1. Increasing opacity at the periphery of the lung bases may represent ill-defined airspace disease and small effusions. 2. Chronic hyperinflation. Electronically Signed   By: Narda Rutherford M.D.   On: 01/16/2023 18:18    Pending Labs Unresulted Labs (From admission, onward)     Start     Ordered   01/17/23 0500  CBC  Tomorrow morning,   R        01/16/23 2023   01/17/23 0500  Comprehensive metabolic panel  Tomorrow morning,   R        01/16/23 2023   01/16/23 2140  Lactic acid, plasma  STAT Now then every 3 hours,   R (with STAT occurrences)      01/16/23 2139   01/16/23 2140  C-reactive protein  Once,   R        01/16/23 2139   01/16/23 2023  Hemoglobin A1c  Once,   R        01/16/23 2023   01/16/23 2019  Expectorated Sputum Assessment w Gram Stain, Rflx to Resp Cult  (COPD / Pneumonia / Cellulitis / Lower Extremity Wound)  Once,   R        01/16/23 2023   01/16/23 2018  Expectorated Sputum Assessment w Gram Stain, Rflx to Resp Cult   (COPD / Pneumonia / Cellulitis / Lower Extremity Wound)  Once,   R        01/16/23 2023   01/16/23 2018  Legionella Pneumophila Serogp 1 Ur Ag  (COPD / Pneumonia / Cellulitis / Lower Extremity Wound)  Once,   R        01/16/23 2023   01/16/23 2018  Strep pneumoniae urinary antigen  (COPD / Pneumonia / Cellulitis / Lower Extremity Wound)  Once,   R        01/16/23 2023            Vitals/Pain Today's Vitals   01/16/23 1654 01/16/23 1830 01/16/23 1950 01/16/23 2045  BP:  126/73    Pulse:  78    Resp:  19    Temp:    98 F (36.7 C)  TempSrc:    Oral  SpO2:  95%    Weight: 57.6 kg     PainSc: 3   3      Isolation Precautions No active isolations  Medications Medications  cefTRIAXone (ROCEPHIN) 2 g in sodium chloride 0.9 % 100 mL IVPB (has no administration in time range)  azithromycin (ZITHROMAX) 500 mg in sodium chloride 0.9 % 250  mL IVPB (has no administration in time range)  heparin injection 5,000 Units (5,000 Units Subcutaneous Given 01/16/23 2145)  acetaminophen (TYLENOL) tablet 650 mg (650 mg Oral Given 01/16/23 2113)    Or  acetaminophen (TYLENOL) suppository 650 mg ( Rectal See Alternative 01/16/23 2113)  ondansetron (ZOFRAN) tablet 4 mg (has no administration in time range)    Or  ondansetron (ZOFRAN) injection 4 mg (has no administration in time range)  ipratropium-albuterol (DUONEB) 0.5-2.5 (3) MG/3ML nebulizer solution 3 mL (has no administration in time range)  cefTRIAXone (ROCEPHIN) 1 g in sodium chloride 0.9 % 100 mL IVPB (0 g Intravenous Stopped 01/16/23 2113)  azithromycin (ZITHROMAX) 500 mg in sodium chloride 0.9 % 250 mL IVPB (0 mg Intravenous Stopped 01/16/23 2147)  sodium chloride 0.9 % bolus 500 mL (0 mLs Intravenous Stopped 01/16/23 2038)  furosemide (LASIX) injection 40 mg (40 mg Intravenous Given 01/16/23 2143)    Mobility walks with person assist     Focused Assessments Cardiac Assessment Handoff:    Lab Results  Component Value Date   TROPONINI <0.30  01/10/2013   Lab Results  Component Value Date   DDIMER 3.03 (H) 01/16/2023   Does the Patient currently have chest pain? No    R Recommendations: See Admitting Provider Note  Report given to:   Additional Notes: AAOx4, ambulates with assistance.

## 2023-01-16 NOTE — ED Triage Notes (Signed)
Pt arrives from home via EMS for c/o SOB since yesterday after chemo infusion. Per EMS pt RA sats mid 80s, requiring 6L . Endorses difficulty taking a deep breath. Febrile yesterday.

## 2023-01-17 ENCOUNTER — Encounter (HOSPITAL_COMMUNITY): Payer: Self-pay | Admitting: Internal Medicine

## 2023-01-17 ENCOUNTER — Inpatient Hospital Stay (HOSPITAL_COMMUNITY): Payer: Medicare Other

## 2023-01-17 ENCOUNTER — Telehealth: Payer: Self-pay | Admitting: Student

## 2023-01-17 DIAGNOSIS — J159 Unspecified bacterial pneumonia: Secondary | ICD-10-CM

## 2023-01-17 DIAGNOSIS — R0609 Other forms of dyspnea: Secondary | ICD-10-CM

## 2023-01-17 DIAGNOSIS — Z9981 Dependence on supplemental oxygen: Secondary | ICD-10-CM | POA: Diagnosis not present

## 2023-01-17 DIAGNOSIS — J9601 Acute respiratory failure with hypoxia: Secondary | ICD-10-CM

## 2023-01-17 DIAGNOSIS — R7989 Other specified abnormal findings of blood chemistry: Secondary | ICD-10-CM

## 2023-01-17 DIAGNOSIS — C9000 Multiple myeloma not having achieved remission: Secondary | ICD-10-CM

## 2023-01-17 DIAGNOSIS — J189 Pneumonia, unspecified organism: Secondary | ICD-10-CM | POA: Diagnosis not present

## 2023-01-17 DIAGNOSIS — R918 Other nonspecific abnormal finding of lung field: Secondary | ICD-10-CM

## 2023-01-17 DIAGNOSIS — Z79899 Other long term (current) drug therapy: Secondary | ICD-10-CM

## 2023-01-17 LAB — CBC
HCT: 36.4 % (ref 36.0–46.0)
HCT: 35 % — ABNORMAL LOW (ref 36.0–46.0)
Hemoglobin: 12.1 g/dL (ref 12.0–15.0)
Hemoglobin: 11.4 g/dL — ABNORMAL LOW (ref 12.0–15.0)
MCH: 32.6 pg (ref 26.0–34.0)
MCH: 32.3 pg (ref 26.0–34.0)
MCHC: 33.2 g/dL (ref 30.0–36.0)
MCHC: 32.6 g/dL (ref 30.0–36.0)
MCV: 98.1 fL (ref 80.0–100.0)
MCV: 99.2 fL (ref 80.0–100.0)
Platelets: 291 10*3/uL (ref 150–400)
Platelets: 261 10*3/uL (ref 150–400)
RBC: 3.71 MIL/uL — ABNORMAL LOW (ref 3.87–5.11)
RBC: 3.53 MIL/uL — ABNORMAL LOW (ref 3.87–5.11)
RDW: 15.5 % (ref 11.5–15.5)
RDW: 15.6 % — ABNORMAL HIGH (ref 11.5–15.5)
WBC: 18.4 10*3/uL — ABNORMAL HIGH (ref 4.0–10.5)
WBC: 14.2 10*3/uL — ABNORMAL HIGH (ref 4.0–10.5)
nRBC: 0.2 % (ref 0.0–0.2)
nRBC: 0.2 % (ref 0.0–0.2)

## 2023-01-17 LAB — COMPREHENSIVE METABOLIC PANEL
ALT: 23 U/L (ref 0–44)
AST: 26 U/L (ref 15–41)
Albumin: 2.6 g/dL — ABNORMAL LOW (ref 3.5–5.0)
Alkaline Phosphatase: 36 U/L — ABNORMAL LOW (ref 38–126)
Anion gap: 11 (ref 5–15)
BUN: 31 mg/dL — ABNORMAL HIGH (ref 8–23)
CO2: 21 mmol/L — ABNORMAL LOW (ref 22–32)
Calcium: 7.5 mg/dL — ABNORMAL LOW (ref 8.9–10.3)
Chloride: 101 mmol/L (ref 98–111)
Creatinine, Ser: 1.18 mg/dL — ABNORMAL HIGH (ref 0.44–1.00)
GFR, Estimated: 46 mL/min — ABNORMAL LOW (ref >60)
Glucose, Bld: 162 mg/dL — ABNORMAL HIGH (ref 70–99)
Potassium: 3.9 mmol/L (ref 3.5–5.1)
Sodium: 133 mmol/L — ABNORMAL LOW (ref 135–145)
Total Bilirubin: 0.7 mg/dL (ref 0.3–1.2)
Total Protein: 5 g/dL — ABNORMAL LOW (ref 6.5–8.1)

## 2023-01-17 LAB — BLOOD GAS, ARTERIAL
Acid-base deficit: 2.6 mmol/L — ABNORMAL HIGH (ref 0.0–2.0)
Bicarbonate: 19.9 mmol/L — ABNORMAL LOW (ref 20.0–28.0)
Drawn by: 59133
O2 Content: 6 L/min
O2 Saturation: 89 %
Patient temperature: 36.7
pCO2 arterial: 28 mmHg — ABNORMAL LOW (ref 32–48)
pH, Arterial: 7.47 — ABNORMAL HIGH (ref 7.35–7.45)
pO2, Arterial: 47 mmHg — ABNORMAL LOW (ref 83–108)

## 2023-01-17 LAB — LACTIC ACID, PLASMA: Lactic Acid, Venous: 2.4 mmol/L (ref 0.5–1.9)

## 2023-01-17 LAB — MRSA NEXT GEN BY PCR, NASAL: MRSA by PCR Next Gen: NOT DETECTED

## 2023-01-17 LAB — ECHOCARDIOGRAM COMPLETE
AR max vel: 1.2 cm2
AV Area VTI: 1.27 cm2
AV Area mean vel: 1.28 cm2
AV Mean grad: 10 mmHg
AV Peak grad: 17.3 mmHg
Ao pk vel: 2.08 m/s
Area-P 1/2: 2.79 cm2
Weight: 2095.25 oz

## 2023-01-17 LAB — C-REACTIVE PROTEIN: CRP: 13.5 mg/dL — ABNORMAL HIGH (ref <1.0)

## 2023-01-17 LAB — CREATININE, SERUM
Creatinine, Ser: 0.98 mg/dL (ref 0.44–1.00)
GFR, Estimated: 58 mL/min — ABNORMAL LOW (ref >60)

## 2023-01-17 LAB — PROTIME-INR
INR: 1.1 (ref 0.8–1.2)
Prothrombin Time: 14.5 seconds (ref 11.4–15.2)

## 2023-01-17 LAB — TROPONIN I (HIGH SENSITIVITY): Troponin I (High Sensitivity): 180 ng/L (ref <18)

## 2023-01-17 LAB — STREP PNEUMONIAE URINARY ANTIGEN: Strep Pneumo Urinary Antigen: NEGATIVE

## 2023-01-17 MED ORDER — SODIUM CHLORIDE 0.9 % IV SOLN
2.0000 g | Freq: Two times a day (BID) | INTRAVENOUS | Status: DC
  Administered 2023-01-17 – 2023-01-18 (×2): 2 g via INTRAVENOUS
  Filled 2023-01-17 (×2): qty 12.5

## 2023-01-17 MED ORDER — ENOXAPARIN SODIUM 60 MG/0.6ML IJ SOSY
60.0000 mg | PREFILLED_SYRINGE | Freq: Once | INTRAMUSCULAR | Status: AC
  Administered 2023-01-17: 60 mg via SUBCUTANEOUS
  Filled 2023-01-17: qty 0.6

## 2023-01-17 MED ORDER — GUAIFENESIN ER 600 MG PO TB12
600.0000 mg | ORAL_TABLET | Freq: Two times a day (BID) | ORAL | Status: DC
  Administered 2023-01-17 – 2023-01-20 (×7): 600 mg via ORAL
  Filled 2023-01-17 (×7): qty 1

## 2023-01-17 MED ORDER — ENOXAPARIN SODIUM 40 MG/0.4ML IJ SOSY: 40.0000 mg | PREFILLED_SYRINGE | INTRAMUSCULAR | Status: DC

## 2023-01-17 MED ORDER — SODIUM CHLORIDE 0.9 % IV SOLN: INTRAVENOUS | Status: DC | PRN

## 2023-01-17 MED ORDER — ENOXAPARIN SODIUM 60 MG/0.6ML IJ SOSY: 60.0000 mg | PREFILLED_SYRINGE | Freq: Two times a day (BID) | INTRAMUSCULAR | Status: DC

## 2023-01-17 MED ORDER — IPRATROPIUM-ALBUTEROL 0.5-2.5 (3) MG/3ML IN SOLN
3.0000 mL | Freq: Four times a day (QID) | RESPIRATORY_TRACT | Status: DC
  Administered 2023-01-17 (×2): 3 mL via RESPIRATORY_TRACT
  Filled 2023-01-17 (×2): qty 3

## 2023-01-17 MED ORDER — SODIUM CHLORIDE 0.9 % IV SOLN
2.0000 g | INTRAVENOUS | Status: DC
  Administered 2023-01-17: 2 g via INTRAVENOUS
  Filled 2023-01-17: qty 12.5

## 2023-01-17 MED ORDER — IPRATROPIUM-ALBUTEROL 0.5-2.5 (3) MG/3ML IN SOLN: 3.0000 mL | Freq: Two times a day (BID) | RESPIRATORY_TRACT | Status: DC

## 2023-01-17 MED ORDER — ACETAMINOPHEN 500 MG PO TABS
1000.0000 mg | ORAL_TABLET | Freq: Three times a day (TID) | ORAL | Status: DC
  Administered 2023-01-17 – 2023-01-20 (×11): 1000 mg via ORAL
  Filled 2023-01-17 (×11): qty 2

## 2023-01-17 NOTE — Progress Notes (Signed)
Bilateral lower extremity venous study completed.   Preliminary results relayed to MD.  Please see CV Procedures for preliminary results.  Christene Lye, RVT  4:33 PM 01/17/23

## 2023-01-17 NOTE — Progress Notes (Signed)
PROGRESS NOTE   Nicole Keith  ZOX:[id number] DOB: 09-12-1940 DOA: 01/16/2023 PCP: Farris Has, MD  Brief Narrative:   82 year old home dwelling white female History of collagenous colitis in 1990s, femoral neck fracture 2014, squamous cell CA, osteoporosis IgG lambda MM-Dr. Jonny Ruiz Dorsey-diagnosis 07/2018 failure Velcade/dexamethasone-second line therapy Revlimid subsequently Pomalyst/Ninlaro--progression of disease 11/06/2022 and discontinuation of Darzalex-currently on Krypolis/dexamethasone OV oncology 6/01/15/2023-suggested Zometa therapy 2/2 lytic lesions/pain  Present from home SOB status post chemo-O2 sat 80%-requiring 6L -Tmax 102 Lactic acid 2.5, CO2 21 BUN/creatinine 35/1.7 (baseline 9/0.8) troponin 207-->180, BNP 191 Dimer elevated 3.3 WBC 18 hemoglobin 12 CXR opacity periphery lung bases = airspace disease?  Effusion  Rx ceftriaxone azithromycin NS 500 cc bolus in ED  2/2 ? troponin, echocardiogram ordered EKG my over read sinus rhythm PR interval 0.20 no ST-T wave changes-compared to EKG 6/3-no overt difference   Hospital-Problem based course  Acute hypoxic respiratory failure on admission-multiple differentials Sepsis on admission 2/2 PNA --DDx chemotherapy-induced?-Lactic acidosis--white count improved, lactic acid slightly down and will repeat if clinically worsening--no temperature at this time SOB improved some, down titrated oxygen to 5 L high flow continue to wean as does not use oxygen at home-ambulatory sat probably in a.m.-out of bed to chair later on today Continue broad-spectrum ABX IV azithromycin and cefepime, Tylenol for fever --- continue Mucinex 600 twice daily, DuoNebs every 4 as needed--chest physio as able Follow sputum culture for completion sake  Elevated troponin 200 range, likely NSTEMI DDx infection DDx PE EKG shows no ST-T wave changes-echo pending-suspect NSTEMI as no overt chest pain on discussion with patient-hold GDMT heparin/nitroglycerin  etc. for now Start very low-dose beta-blocker XL Toprol-XL after echo results  IgG lambda myeloma on third line therapies-seen by Dr. Mayra Neer metastatic pain Appreciate oncology expertise-further discussion on Monday with Dr. Leonides Schanz- D-dimer elevated-await lower extremity duplex given high risk patient with underlying myeloma  AKI on admission + mild metabolic acidosis Resolving slowly continue fluids if CO2 drops below 20 will consider bicarb addition  ?  HFpEF  Prior collagenous colitis-stable at this time no treatment Prior hip fracture in 2014-stable and improved up out of bed  DVT prophylaxis: Lovenox Code Status: Full code confirmed Family Communication: None Disposition:  Status is: Inpatient Remains inpatient appropriate because:   Requires treatment on the progressive unit and then transition to regular unit in several days    Subjective: Pleasant awake talkingI--seems comfortable overall says she is close to 60% of her normal Does not use home oxygen Wants to get up and move around No chest pain  Objective: Vitals:   01/17/23 0530 01/17/23 0600 01/17/23 0630 01/17/23 0651  BP: (!) 106/59 (!) 107/53 119/72   Pulse: 81 74 85 78  Resp: (!) 25 20 19  (!) 21  Temp:      TempSrc:      SpO2: 91% 94% 91% 93%  Weight:        Intake/Output Summary (Last 24 hours) at 01/17/2023 0737 Last data filed at 01/17/2023 0500 Gross per 24 hour  Intake --  Output 575 ml  Net -575 ml   Filed Weights   01/16/23 1654 01/17/23 0008 01/17/23 0500  Weight: 57.6 kg 59.4 kg 59.4 kg    Examination:  EOMI NCAT no focal deficit Chest clear no added sound rales rhonchi S1-S2 no murmur NSR on monitor Abdomen soft no rebound no guarding Neuro intact no focal deficit to power No lower extremity edema no cords   Data Reviewed: personally reviewed  CBC    Component Value Date/Time   WBC 18.4 (H) 01/17/2023 0205   RBC 3.71 (L) 01/17/2023 0205   HGB 12.1 01/17/2023 0205    HGB 13.0 01/15/2023 0745   HGB 10.8 (L) 06/11/2017 0753   HCT 36.4 01/17/2023 0205   HCT 32.2 (L) 06/11/2017 0753   PLT 291 01/17/2023 0205   PLT 566 (H) 01/15/2023 0745   PLT 321 06/11/2017 0753   MCV 98.1 01/17/2023 0205   MCV 95.4 06/11/2017 0753   MCH 32.6 01/17/2023 0205   MCHC 33.2 01/17/2023 0205   RDW 15.5 01/17/2023 0205   RDW 15.6 (H) 06/11/2017 0753   LYMPHSABS 1.9 01/15/2023 0745   LYMPHSABS 2.1 06/11/2017 0753   MONOABS 0.7 01/15/2023 0745   MONOABS 0.5 06/11/2017 0753   EOSABS 0.2 01/15/2023 0745   EOSABS 0.1 06/11/2017 0753   BASOSABS 0.1 01/15/2023 0745   BASOSABS 0.1 06/11/2017 0753      Latest Ref Rng & Units 01/17/2023    2:05 AM 01/16/2023    8:19 PM 01/16/2023    6:29 PM  CMP  Glucose 70 - 99 mg/dL 259   563   BUN 8 - 23 mg/dL 31   35   Creatinine 8.75 - 1.00 mg/dL 6.43  3.29  5.18   Sodium 135 - 145 mmol/L 133   133   Potassium 3.5 - 5.1 mmol/L 3.9   4.8   Chloride 98 - 111 mmol/L 101   100   CO2 22 - 32 mmol/L 21   22   Calcium 8.9 - 10.3 mg/dL 7.5   8.3   Total Protein 6.5 - 8.1 g/dL 5.0   5.7   Total Bilirubin 0.3 - 1.2 mg/dL 0.7   0.7   Alkaline Phos 38 - 126 U/L 36   40   AST 15 - 41 U/L 26   35   ALT 0 - 44 U/L 23   27      Radiology Studies: CT CHEST WO CONTRAST  Result Date: 01/16/2023 CLINICAL DATA:  Dyspnea, multiple myeloma, spinal fracture EXAM: CT CHEST WITHOUT CONTRAST TECHNIQUE: Multidetector CT imaging of the chest was performed following the standard protocol without IV contrast. RADIATION DOSE REDUCTION: This exam was performed according to the departmental dose-optimization program which includes automated exposure control, adjustment of the mA and/or kV according to patient size and/or use of iterative reconstruction technique. COMPARISON:  MRI 07/24/2018, skeletal survey 11/03/2022 FINDINGS: Cardiovascular: Moderate multi-vessel coronary artery calcification. Global cardiac size within normal limits. Trace pericardial effusion.  Central pulmonary arteries are of normal caliber. Moderate atherosclerotic calcification within the thoracic aorta. No aortic aneurysm. Mediastinum/Nodes: Visualized thyroid is unremarkable. No pathologic thoracic adenopathy. Esophagus unremarkable. Lungs/Pleura: Small partially loculated posterobasal pleural effusions are present. There is bilateral posterior basal lower lobe consolidation with endobronchial debris which may reflect changes of superimposed multifocal infection or aspiration. Pulmonary hyperinflation noted in keeping with probable air trapping in the setting of COPD. Lobulated subpleural pulmonary nodule within the right upper lobe measures 10 mm at axial image # 44/5, indeterminate. Additional indeterminate nodular density at the left apex, axial image # 33/5 measures 9 mm in mean diameter. No pneumothorax. No central obstructing lesion. Upper Abdomen: No acute abnormality. Musculoskeletal: Expansile lytic mass within the anterior right seventh rib demonstrates significant soft tissue component measuring 4.2 x 5.1 cm at axial image # 52/3. There is expansile changes involving the anterior left seventh and sixth ribs likely related to changes related to the patient's  underlying myeloma. Similarly, lytic lesions have developed within the thoracic spine, most notably at T7. There is diffuse osteopenia of the thoracic spine. Progressive loss of height of the T8 and T9 vertebral bodies with accentuated thoracic kyphosis. Remote healed sternal manubrial fracture. Remote L1 superior and inferior endplate fractures. No acute bone abnormality. IMPRESSION: 1. Bilateral posterior basal lower lobe consolidation with endobronchial debris which may reflect changes of superimposed multifocal infection or aspiration. 2. Small partially loculated posterobasal pleural effusions. 3. Moderate multi-vessel coronary artery calcification. 4. Pulmonary hyperinflation in keeping with probable air trapping in keeping with  COPD. 5. Expansile lytic mass within the anterior right seventh rib with significant soft tissue component measuring 4.2 x 5.1 cm. There is also expansile changes involving the anterior left seventh and sixth ribs. Similarly, lytic lesions have developed within the thoracic spine, most notably at T7. Findings are in keeping with the patient's underlying myeloma. 6. Progressive loss of height of the T8 and T9 vertebral bodies with accentuated thoracic kyphosis. 7. Remote healed sternal manubrial fracture. Remote L1 superior and inferior endplate fractures. 8. Lobulated subpleural pulmonary nodule within the right upper lobe measures 10 mm at axial image # 44/5, indeterminate. Additional indeterminate nodular density at the left apex, axial image # 33/5 measures 9 mm in mean diameter. Consider one of the following in 3 months for both low-risk and high-risk individuals: (a) repeat chest CT, (b) follow-up PET-CT, or (c) tissue sampling. This recommendation follows the consensus statement: Guidelines for Management of Incidental Pulmonary Nodules Detected on CT Images: From the Fleischner Society 2017; Radiology 2017; 284:228-243. Aortic Atherosclerosis (ICD10-I70.0). Electronically Signed   By: Helyn Numbers M.D.   On: 01/16/2023 22:59   DG Chest Port 1 View  Result Date: 01/16/2023 CLINICAL DATA:  Cough and shortness of breath. EXAM: PORTABLE CHEST 1 VIEW COMPARISON:  Chest radiograph 07/16/2017, chest from bone survey 11/03/2022 FINDINGS: Chronic hyperinflation. The heart is upper normal in size. Stable mediastinal contours. Increasing opacity at the periphery of the lung bases may represent ill-defined airspace disease and small effusions. No pulmonary edema. No pneumothorax. Patient's known bone lesions are not well characterized on the current exam. IMPRESSION: 1. Increasing opacity at the periphery of the lung bases may represent ill-defined airspace disease and small effusions. 2. Chronic hyperinflation.  Electronically Signed   By: Narda Rutherford M.D.   On: 01/16/2023 18:18     Scheduled Meds:  acetaminophen  1,000 mg Oral TID   Chlorhexidine Gluconate Cloth  6 each Topical Q0600   guaiFENesin  600 mg Oral BID   Continuous Infusions:  azithromycin     ceFEPime (MAXIPIME) IV       LOS: 1 day   Time spent: 78  Rhetta Mura, MD [facility] To contact the attending provider between 7A-7P or the covering provider during after hours 7P-7A, please log into the web site [email or web address] and access using universal [place] password for that web site. If you do not have the password, please call the hospital operator.  01/17/2023, 7:37 AM

## 2023-01-17 NOTE — Progress Notes (Signed)
PHARMACY NOTE:  ANTIMICROBIAL RENAL DOSAGE ADJUSTMENT  Current antimicrobial regimen includes a mismatch between antimicrobial dosage and estimated renal function.  As per policy approved by the Pharmacy & Therapeutics and Medical Executive Committees, the antimicrobial dosage will be adjusted accordingly.  Current antimicrobial dosage:  Cefepime 2gm IV q24h  Indication: sepsis, PNA  Renal Function:  Estimated Creatinine Clearance: 34.4 mL/min (A) (by C-G formula based on SCr of 1.18 mg/dL (H)). []      On intermittent HD, scheduled: []      On CRRT    Antimicrobial dosage has been changed to:   Cefepime 2gm IV q12h  Additional comments:   Thank you for allowing pharmacy to be a part of this patient's care.  Junita Push, [facility] 01/17/2023 5:15 AM

## 2023-01-17 NOTE — Consult Note (Signed)
NAME:  Nicole Keith, MRN:  [id number], DOB:  November 17, 1940, LOS: 1 ADMISSION DATE:  01/16/2023, CONSULTATION DATE:  01/17/23 REFERRING MD:  Maisie Fus, CHIEF COMPLAINT:  dyspnea, cough, pleuritic CP   History of Present Illness:  81yF with history of collagenous colitis, GERD, MM on chemo/IT (carfilzomib) followed by Dr. Leonides Schanz who presented to ED with acute dyspnea, cough, and pleuritic CP over last 2 weeks, worsening especially over the day PTA along with fever and chills.    She was started on zosyn, azithromycin and given 1 dose of lovenox 1mg /kg. Required 11L HFNC.  Pertinent  Medical History  Collagenous colitis GERD MM  Significant Hospital Events: Including procedures, antibiotic start and stop dates in addition to other pertinent events   6/7 admitted started on zosyn, azithromycin and given 1 dose of lovenox 1mg /kg, requiring 11L HFNC  Interim History / Subjective:    Objective   Blood pressure 119/72, pulse 78, temperature 98.1 F (36.7 C), temperature source Oral, resp. rate (!) 21, weight 59.4 kg, SpO2 93 %.    FiO2 (%):  [69 %-81 %] 81 %   Intake/Output Summary (Last 24 hours) at 01/17/2023 0728 Last data filed at 01/17/2023 0500 Gross per 24 hour  Intake --  Output 575 ml  Net -575 ml   Filed Weights   01/16/23 1654 01/17/23 0008 01/17/23 0500  Weight: 57.6 kg 59.4 kg 59.4 kg    Examination: General appearance: 82 y.o., female, NAD, conversant  Eyes: anicteric sclerae; PERRL, tracking appropriately HENT: NCAT; MMM Neck: Trachea midline; no lymphadenopathy, no JVD Lungs: CTAB, no crackles, no wheeze, with normal respiratory effort CV: RRR, no murmur  Abdomen: Soft, non-tender; non-distended, BS present  Extremities: No peripheral edema, warm Skin: Normal turgor and texture; no rash Psych: Appropriate affect Neuro: Alert and oriented to person and place, no focal deficit   CT Chest with bilateral lower lobe consolidation and trace effusions, 1cm LUL nodule, 9mm  RUL subpleural nodule  Resolved Hospital Problem list    Assessment & Plan:   # CAP # Pulmonary nodules # Acute hypoxic respiratory failure Lower lobe consolidation and shunt probably sufficiently explains her hypoxia. Lower suspicion for PE. Serious pulmonary AEs (other than pneumonia) appear to be very rare with carfilzomib and this doesn't radiographic appearance of what's been described.  - zosyn, azithromycin, follow cultures and microbiologic workup, narrow as able - pulmonary hygiene - mucinex, scheduled flutter 10 puffs q4 while awake, bronchodilators  - f/u LE duplex, I don't think she needs empiric VTE treatment. If O2 requirement failing to improve with tx of her pneumonia and appears out of proportion to her parenchymal disease then could consider CTA Chest if duplex negative - could ask oncology if there's any role for inpatient zometa for her painful lytic bone lesions - per their note she may need dental clearance first - would repeat a CT Chest in a few months and I can arrange clinic follow up for this - PT/mobilize - wean o2 for saturation 92% and walking oximetry before discharge  Will sign off but glad to be reinvolved as condition changes  Best Practice (right click and "Reselect all SmartList Selections" daily)   Per primary  Labs   CBC: Recent Labs  Lab 01/15/23 0745 01/16/23 1829 01/16/23 2019 01/17/23 0205  WBC 8.1 19.6* 19.9* 18.4*  NEUTROABS 5.1  --   --   --   HGB 13.0 12.2 12.4 12.1  HCT 38.8 38.2 38.1 36.4  MCV 97.2 99.5 99.2  98.1  PLT 566* 346 320 291    Basic Metabolic Panel: Recent Labs  Lab 01/15/23 0745 01/16/23 1829 01/16/23 2019 01/17/23 0205  NA 139 133*  --  133*  K 4.3 4.8  --  3.9  CL 105 100  --  101  CO2 28 22  --  21*  GLUCOSE 111* 191*  --  162*  BUN 9 35*  --  31*  CREATININE 0.83 1.78* 1.44* 1.18*  CALCIUM 8.8* 8.3*  --  7.5*   GFR: Estimated Creatinine Clearance: 34.4 mL/min (A) (by C-G formula based on SCr of  1.18 mg/dL (H)). Recent Labs  Lab 01/15/23 0745 01/16/23 1829 01/16/23 2019 01/16/23 2126 01/16/23 2206 01/17/23 0205  PROCALCITON  --   --   --  0.72  --   --   WBC 8.1 19.6* 19.9*  --   --  18.4*  LATICACIDVEN  --   --   --   --  2.5* 2.4*    Liver Function Tests: Recent Labs  Lab 01/15/23 0745 01/16/23 1829 01/17/23 0205  AST 19 35 26  ALT 17 27 23   ALKPHOS 56 40 36*  BILITOT 0.8 0.7 0.7  PROT 6.3* 5.7* 5.0*  ALBUMIN 3.9 3.0* 2.6*   No results for input(s): "LIPASE", "AMYLASE" in the last 168 hours. No results for input(s): "AMMONIA" in the last 168 hours.  ABG    Component Value Date/Time   PHART 7.47 (H) 01/16/2023 2157   PCO2ART 28 (L) 01/16/2023 2157   PO2ART 47 (L) 01/16/2023 2157   HCO3 19.9 (L) 01/16/2023 2157   ACIDBASEDEF 2.6 (H) 01/16/2023 2157   O2SAT 89 01/16/2023 2157     Coagulation Profile: Recent Labs  Lab 01/17/23 0205  INR 1.1    Cardiac Enzymes: No results for input(s): "CKTOTAL", "CKMB", "CKMBINDEX", "TROPONINI" in the last 168 hours.  HbA1C: Hgb A1c MFr Bld  Date/Time Value Ref Range Status  01/09/2013 07:46 PM 5.6 <5.7 % Final    Comment:    (NOTE)                                                                       According to the ADA Clinical Practice Recommendations for 2011, when HbA1c is used as a screening test:  >=6.5%   Diagnostic of Diabetes Mellitus           (if abnormal result is confirmed) 5.7-6.4%   Increased risk of developing Diabetes Mellitus References:Diagnosis and Classification of Diabetes Mellitus,Diabetes Care,2011,34(Suppl 1):S62-S69 and Standards of Medical Care in         Diabetes - 2011,Diabetes Care,2011,34 (Suppl 1):S11-S61.    CBG: No results for input(s): "GLUCAP" in the last 168 hours.  Review of Systems:   12 point review of systems is negative except as in HPI  Past Medical History:  She,  has a past medical history of Allergic rhinitis, Arthritis, Colitis, collagenous, Diverticular  disease, Family hx of colon cancer, Fracture of femoral neck, right (HCC) (01/09/2013), GERD (gastroesophageal reflux disease), Hiatal hernia, Hyperlipidemia, muliplt myelom (2020), and Osteoarthritis of right hip (01/10/2013).   Surgical History:   Past Surgical History:  Procedure Laterality Date   CHOLECYSTECTOMY     EYE SURGERY  feb 2016  cataract right eye   LEG SURGERY Left    TONSILLECTOMY     TOTAL HIP ARTHROPLASTY Right 01/10/2013   Procedure: TOTAL HIP ARTHROPLASTY;  Surgeon: Eulas Post, MD;  Location: [facility] OR;  Service: Orthopedics;  Laterality: Right;     Social History:   reports that she quit smoking about 51 years ago. She has never used smokeless tobacco. She reports that she does not drink alcohol and does not use drugs.   Family History:  Her family history includes Arthritis-Osteo in her mother; Colon cancer in her maternal aunt, maternal aunt, and mother; Congestive Heart Failure in her mother; Heart attack in her father; Hypothyroidism in her sister; Rheum arthritis in her father.   Allergies Allergies  Allergen Reactions   Kyprolis [Carfilzomib] Shortness Of Breath and Other (See Comments)    Has now started experiencing pneumonia-alike symptoms   Pomalyst [Pomalidomide] Rash and Other (See Comments)    ENTIRE BODY BROKE OUT IN A RASH   Revlimid [Lenalidomide] Rash and Other (See Comments)    WHOLE BODY BLISTERED   Betadine [Povidone Iodine] Rash   Flagyl [Metronidazole] Diarrhea   Fosamax [Alendronate Sodium] Other (See Comments)    GI distress   Bactrim [Sulfamethoxazole-Trimethoprim] Nausea Only   Clindamycin/Lincomycin     Pt can't remember reaction type.   Doxepin Hcl Other (See Comments)    Irritable, hallucinations   Hydrocodone-Acetaminophen     Restlessness   Hydroxyzine Other (See Comments)    Hallucinations   Hydroxyzine Hcl Other (See Comments)    Hallucinations    Tramadol Hcl Other (See Comments)    Reaction not noted   Valium  [Diazepam] Other (See Comments)    Reaction not recalled   Actonel [Risedronate Sodium] Other (See Comments)    GI distress   Boniva [Ibandronic Acid] Other (See Comments)    GI distress   Doxycycline Rash   Penicillins Rash    Ancef was OK     Home Medications  Prior to Admission medications   Medication Sig Start Date End Date Taking? Authorizing Provider  acetaminophen (TYLENOL) 500 MG tablet Take 1,000 mg by mouth See admin instructions. Take 1,000 mg by mouth after breakfast, at 3:30 PM, and at 8:30 PM   Yes [provider]  famotidine-calcium carbonate-magnesium hydroxide (PEPCID COMPLETE) 10-800-165 MG chewable tablet Chew 1 tablet by mouth daily as needed (for heartburn or indigestion).    Yes [provider]  ibuprofen (ADVIL) 400 MG tablet Take 400 mg by mouth every 8 (eight) hours as needed for headache or mild pain.   Yes [provider]  triamcinolone cream (KENALOG) 0.1 % Apply 1 Application topically 2 (two) times daily. Patient taking differently: Apply 1 Application topically 2 (two) times daily as needed (for allergic flares- affected areas). 11/20/22  Yes Jaci Standard, MD  VASELINE PURE ULTRA WHITE ointment Apply 1 application  topically See admin instructions. Apply to the genitalia in the morning and at bedtime   Yes [provider]  acyclovir (ZOVIRAX) 400 MG tablet Take 1 tablet (400 mg total) by mouth 2 (two) times daily. Patient not taking: Reported on 01/16/2023 01/15/23   Jaci Standard, MD     Critical care time: na

## 2023-01-17 NOTE — Plan of Care (Signed)

## 2023-01-17 NOTE — Consult Note (Signed)
Nicole Keith is a very nice 82 year old white female.  She is followed by Dr. Leonides Schanz.  She has IgG lambda myeloma.  She I think presented probably about 4 and half years ago.  She currently is on Kyprolis/dexamethasone.  She has been doing fairly well.  However, she had her last treatment I think on 01/15/2023.  She developed shortness of breath the next day.  She was brought into the ER.  She was hypoxic.  She had a CT scan that was done.  This showed bilateral consolidation in the lower lobes.  She was admitted on 01/16/2023.  When she came in, her sodium 133.  Potassium 4.8.  BUN 35 creatinine 1.78.  Calcium 8.3 with an albumin of 3.0.  Her white count is 19.6.  Hemoglobin 12.2.  Platelet count 346,000.  She was admitted to the ICU.  She is on supplemental oxygen right now.  She appears to be fairly comfortable.  Of note, her last IgG level was 1137 mg/dL.  This was about a month ago.  This morning, her white count is 18.4.  Hemoglobin 12.1.  Platelet count 291,000.  Her sodium is 133.  Potassium 3.9.  BUN 31 creatinine 1.18.  Calcium 7.5 with an albumin of 2.6.  She currently is on Zithromax and Maxipime.  She does not complain of any pain.  There is no nausea or vomiting.  She has had no bleeding or bruising.  She has had no leg swelling.   Her vital signs show temperature 98.1.  Pulse 78.  Blood pressure 119/72.  Her head neck exam shows no ocular or oral lesions.  There are no palpable cervical or supraclavicular lymph nodes.  Lungs are clear bilaterally.  She has good air movement bilaterally.  Cardiac exam regular rate and rhythm.  She has no murmurs.  Abdomen is soft.  Bowel sounds are present.  There is no fluid wave.  There is no palpable liver or spleen tip.  Extremity shows no clubbing, cyanosis or edema.  She has good range of motion of her joints.  Skin exam shows no rashes, ecchymosis or petechia.  Neurological exam is nonfocal.    Nicole Keith is a very charming 82 year old white  female.  She has IgG lambda myeloma.  She is on Kyprolis and dexamethasone.  Of note, she had echocardiogram that was done a week ago.  She had a very good ejection fraction of 60-65%.  This certainly looks like an infectious process.  She seems to be doing a little bit better.  She will continue on IV antibiotics.  She is not neutropenic.  Her IgG level is okay so I do not think she needs supplemental IVIG.  She is on nebulizers.  Hopefully this will help.  I think she is smoked in the distant past.  On her CT scan and chest x-ray, she does have some underlying COPD.  It will be interesting to see if there are any positive cultures.  We will follow along.  I know that she will get incredible care from everybody in the ICU.   Christin Bach, MD  Jeri Modena 33:6

## 2023-01-17 NOTE — Telephone Encounter (Signed)
Can we set up clinic follow up in 4 months with any MD?  Thanks!

## 2023-01-17 NOTE — Progress Notes (Signed)
RT note: Pt. sleeping in no distress upon my arrival, CPT order, Flutter Q2/Q4w/a, not done this shift.

## 2023-01-18 DIAGNOSIS — J189 Pneumonia, unspecified organism: Secondary | ICD-10-CM | POA: Diagnosis not present

## 2023-01-18 LAB — CBC WITH DIFFERENTIAL/PLATELET
Abs Immature Granulocytes: 0.1 10*3/uL — ABNORMAL HIGH (ref 0.00–0.07)
Basophils Absolute: 0 10*3/uL (ref 0.0–0.1)
Basophils Relative: 1 %
Eosinophils Absolute: 0.4 10*3/uL (ref 0.0–0.5)
Eosinophils Relative: 5 %
HCT: 33 % — ABNORMAL LOW (ref 36.0–46.0)
Hemoglobin: 10.9 g/dL — ABNORMAL LOW (ref 12.0–15.0)
Immature Granulocytes: 1 %
Lymphocytes Relative: 13 %
Lymphs Abs: 0.9 10*3/uL (ref 0.7–4.0)
MCH: 32.3 pg (ref 26.0–34.0)
MCHC: 33 g/dL (ref 30.0–36.0)
MCV: 97.9 fL (ref 80.0–100.0)
Monocytes Absolute: 0.4 10*3/uL (ref 0.1–1.0)
Monocytes Relative: 5 %
Neutro Abs: 5.5 10*3/uL (ref 1.7–7.7)
Neutrophils Relative %: 75 %
Platelets: 177 10*3/uL (ref 150–400)
RBC: 3.37 MIL/uL — ABNORMAL LOW (ref 3.87–5.11)
RDW: 15.1 % (ref 11.5–15.5)
WBC: 7.3 10*3/uL (ref 4.0–10.5)
nRBC: 0 % (ref 0.0–0.2)

## 2023-01-18 LAB — COMPREHENSIVE METABOLIC PANEL
ALT: 21 U/L (ref 0–44)
AST: 20 U/L (ref 15–41)
Albumin: 2.4 g/dL — ABNORMAL LOW (ref 3.5–5.0)
Alkaline Phosphatase: 33 U/L — ABNORMAL LOW (ref 38–126)
Anion gap: 7 (ref 5–15)
BUN: 26 mg/dL — ABNORMAL HIGH (ref 8–23)
CO2: 22 mmol/L (ref 22–32)
Calcium: 7.5 mg/dL — ABNORMAL LOW (ref 8.9–10.3)
Chloride: 107 mmol/L (ref 98–111)
Creatinine, Ser: 0.68 mg/dL (ref 0.44–1.00)
GFR, Estimated: >60 mL/min (ref >60)
Glucose, Bld: 98 mg/dL (ref 70–99)
Potassium: 3.5 mmol/L (ref 3.5–5.1)
Sodium: 136 mmol/L (ref 135–145)
Total Bilirubin: 0.7 mg/dL (ref 0.3–1.2)
Total Protein: 4.5 g/dL — ABNORMAL LOW (ref 6.5–8.1)

## 2023-01-18 LAB — CULTURE, BLOOD (ROUTINE X 2)

## 2023-01-18 MED ORDER — CHLORHEXIDINE GLUCONATE CLOTH 2 % EX PADS
6.0000 | MEDICATED_PAD | Freq: Every day | CUTANEOUS | Status: DC
  Administered 2023-01-18: 6 via TOPICAL

## 2023-01-18 MED ORDER — LEVOFLOXACIN 500 MG PO TABS
500.0000 mg | ORAL_TABLET | Freq: Every day | ORAL | Status: DC
  Administered 2023-01-18 – 2023-01-20 (×3): 500 mg via ORAL
  Filled 2023-01-18 (×3): qty 1

## 2023-01-18 NOTE — Progress Notes (Signed)
PROGRESS NOTE   Nicole Keith  ZOX:[id number] DOB: 1941/05/04 DOA: 01/16/2023 PCP: Farris Has, MD  Brief Narrative:   82 year old home dwelling white female History of collagenous colitis in 1990s, femoral neck fracture 2014, squamous cell CA, osteoporosis IgG lambda MM-Dr. Jonny Ruiz Dorsey-diagnosis 07/2018 failure Velcade/dexamethasone-second line therapy Revlimid subsequently Pomalyst/Ninlaro--progression of disease 11/06/2022 and discontinuation of Darzalex-currently on Krypolis/dexamethasone OV oncology 6/01/15/2023-suggested Zometa therapy 2/2 lytic lesions/pain  Present from home SOB status post chemo-O2 sat 80%-requiring 6L Gillespie-Tmax 102 Lactic acid 2.5, CO2 21 BUN/creatinine 35/1.7 (baseline 9/0.8) troponin 207-->180, BNP 191 Dimer elevated 3.3 WBC 18 hemoglobin 12 CXR opacity periphery lung bases = airspace disease?  Effusion  Rx ceftriaxone azithromycin NS 500 cc bolus in ED  2/2 ? troponin, echocardiogram ordered EKG my over read sinus rhythm PR interval 0.20 no ST-T wave changes-compared to EKG 6/3-no overt difference  6/8 Echo EF 55-60%, Mild Aov stenosis  Hospital-Problem based course  Acute hypoxic respiratory failure on admission-2/2 PNA Sepsis on admission 2/2 PNA---Lactic acidosis--WBC improved---down titrated oxygen to No requirement on 6/9 Strp PNa neg, pending legionella Iv Abx--> Levaquin--Tylenol for fever --- continue Mucinex 600 twice daily, DuoNebs every 4 as needed--chest physio as able Not sputum so cancelled order  Elevated troponin 200 range, likely NSTEMI DDx infection DDx PE EKG shows no ST-T wave changes-echo -suspect NSTEMI as no overt chest pain on discussion with patient-hold GDMT heparin/nitroglycerin etc. for now Hold BB--no WMA  IgG lambda myeloma on third line therapies-seen by Dr. Mayra Neer metastatic pain Appreciate oncology expertise-she tells me her paiin is better--prefers to defer Zometa tot he OP setting D-dimer elevated-await lower  extremity duplex given high risk patient with underlying myeloma  AKI on admission + mild metabolic acidosis NSL--Acidosis resolved  ?  HFpEF  Prior collagenous colitis-stable at this time no treatment Prior hip fracture in 2014-stable and improved up out of bed  Follow A1c  DVT prophylaxis: Lovenox Code Status: Full code confirmed Family Communication: None Disposition:  Status is: Inpatient Remains inpatient appropriate because:   Likely can d/c in 24-48 h    Subjective:  Coherent in nad--looks very well, off oxygen No chills  No rales rhonchi  Objective: Vitals:   01/18/23 0700 01/18/23 0800 01/18/23 0900 01/18/23 1000  BP:   136/71   Pulse: 62  68 76  Resp: 17  (!) 26 14  Temp:  98 F (36.7 C)    TempSrc:  Oral    SpO2: 97%  97% 92%  Weight:      Height:        Intake/Output Summary (Last 24 hours) at 01/18/2023 1021 Last data filed at 01/18/2023 0406 Gross per 24 hour  Intake 1169.61 ml  Output 1025 ml  Net 144.61 ml    Filed Weights   01/16/23 1654 01/17/23 0008 01/17/23 0500  Weight: 57.6 kg 59.4 kg 59.4 kg    Examination:  EOMI NCAT no focal deficit--off oxygen now Chest clear  S1-S2 no murmur NSR on monitor Abdomen soft no rebound no guarding Neuro intact  to power No lower extremity edema no cords   Data Reviewed: personally reviewed   CBC    Component Value Date/Time   WBC 7.3 01/18/2023 0306   RBC 3.37 (L) 01/18/2023 0306   HGB 10.9 (L) 01/18/2023 0306   HGB 13.0 01/15/2023 0745   HGB 10.8 (L) 06/11/2017 0753   HCT 33.0 (L) 01/18/2023 0306   HCT 32.2 (L) 06/11/2017 0753   PLT 177 01/18/2023 0306  PLT 566 (H) 01/15/2023 0745   PLT 321 06/11/2017 0753   MCV 97.9 01/18/2023 0306   MCV 95.4 06/11/2017 0753   MCH 32.3 01/18/2023 0306   MCHC 33.0 01/18/2023 0306   RDW 15.1 01/18/2023 0306   RDW 15.6 (H) 06/11/2017 0753   LYMPHSABS 0.9 01/18/2023 0306   LYMPHSABS 2.1 06/11/2017 0753   MONOABS 0.4 01/18/2023 0306   MONOABS 0.5  06/11/2017 0753   EOSABS 0.4 01/18/2023 0306   EOSABS 0.1 06/11/2017 0753   BASOSABS 0.0 01/18/2023 0306   BASOSABS 0.1 06/11/2017 0753      Latest Ref Rng & Units 01/18/2023    3:06 AM 01/17/2023    8:36 AM 01/17/2023    2:05 AM  CMP  Glucose 70 - 99 mg/dL 98   308   BUN 8 - 23 mg/dL 26   31   Creatinine 6.57 - 1.00 mg/dL 8.46  9.62  9.52   Sodium 135 - 145 mmol/L 136   133   Potassium 3.5 - 5.1 mmol/L 3.5   3.9   Chloride 98 - 111 mmol/L 107   101   CO2 22 - 32 mmol/L 22   21   Calcium 8.9 - 10.3 mg/dL 7.5   7.5   Total Protein 6.5 - 8.1 g/dL 4.5   5.0   Total Bilirubin 0.3 - 1.2 mg/dL 0.7   0.7   Alkaline Phos 38 - 126 U/L 33   36   AST 15 - 41 U/L 20   26   ALT 0 - 44 U/L 21   23      Radiology Studies: VAS [place] LOWER EXTREMITY VENOUS (DVT)  Result Date: 01/17/2023  Lower Venous DVT Study Patient Name:  ADANELLY ALTMAN  Date of Exam:   01/17/2023 Medical Rec #: [id number]         Accession #:    [id number] Date of Birth: 12-19-1940         Patient Gender: F Patient Age:   1 years Exam Location:  [facility] Procedure:      VAS [place] LOWER EXTREMITY VENOUS (DVT) Referring Phys: Rhetta Mura --------------------------------------------------------------------------------  Indications: Elevated D-Dimer.  Comparison Study: No previous study. Performing Technologist: McKayla Maag RVT, VT  Examination Guidelines: A complete evaluation includes B-mode imaging, spectral Doppler, color Doppler, and power Doppler as needed of all accessible portions of each vessel. Bilateral testing is considered an integral part of a complete examination. Limited examinations for reoccurring indications may be performed as noted. The reflux portion of the exam is performed with the patient in reverse Trendelenburg.  +---------+---------------+---------+-----------+----------+--------------+ RIGHT    CompressibilityPhasicitySpontaneityPropertiesThrombus Aging  +---------+---------------+---------+-----------+----------+--------------+ CFV      Full           Yes      Yes                                 +---------+---------------+---------+-----------+----------+--------------+ SFJ      Full                                                        +---------+---------------+---------+-----------+----------+--------------+ FV Prox  Full                                                        +---------+---------------+---------+-----------+----------+--------------+  FV Mid   Full                                                        +---------+---------------+---------+-----------+----------+--------------+ FV DistalFull                                                        +---------+---------------+---------+-----------+----------+--------------+ PFV      Full                                                        +---------+---------------+---------+-----------+----------+--------------+ POP      Full           Yes      Yes                                 +---------+---------------+---------+-----------+----------+--------------+ PTV      Full                                                        +---------+---------------+---------+-----------+----------+--------------+ PERO     Full                                                        +---------+---------------+---------+-----------+----------+--------------+   +---------+---------------+---------+-----------+----------+--------------+ LEFT     CompressibilityPhasicitySpontaneityPropertiesThrombus Aging +---------+---------------+---------+-----------+----------+--------------+ CFV      Full           Yes      Yes                                 +---------+---------------+---------+-----------+----------+--------------+ SFJ      Full                                                         +---------+---------------+---------+-----------+----------+--------------+ FV Prox  Full                                                        +---------+---------------+---------+-----------+----------+--------------+ FV Mid   Full                                                        +---------+---------------+---------+-----------+----------+--------------+  FV DistalFull                                                        +---------+---------------+---------+-----------+----------+--------------+ PFV      Full                                                        +---------+---------------+---------+-----------+----------+--------------+ POP      Full           Yes      Yes                                 +---------+---------------+---------+-----------+----------+--------------+ PTV      Full                                                        +---------+---------------+---------+-----------+----------+--------------+ PERO     Full                                                        +---------+---------------+---------+-----------+----------+--------------+     Summary: BILATERAL: - No evidence of deep vein thrombosis seen in the lower extremities, bilaterally. - No evidence of superficial venous thrombosis in the lower extremities, bilaterally. -No evidence of popliteal cyst, bilaterally.   *See table(s) above for measurements and observations.    Preliminary    ECHOCARDIOGRAM COMPLETE  Result Date: 01/17/2023    ECHOCARDIOGRAM REPORT   Patient Name:   LETITA MKRTCHYAN Date of Exam: 01/17/2023 Medical Rec #:  [id number]        Height:       66.0 in Accession #:    [id number]       Weight:       131.0 lb Date of Birth:  06/11/1941        BSA:          1.670 m Patient Age:    81 years         BP:           109/62 mmHg Patient Gender: F                HR:           76 bpm. Exam Location:  Inpatient Procedure: 2D Echo, Color Doppler and Cardiac Doppler  Indications:    R06.9 DOE  History:        Patient has prior history of Echocardiogram examinations, most                 recent 01/10/2013. Risk Factors:Dyslipidemia.  Sonographer:    Irving Burton Senior RDCS Referring Phys: [id number] SARA-MAIZ A THOMAS  Sonographer Comments: Poor parasternal window due to thin body habitus. IMPRESSIONS  1. Left ventricular ejection fraction, by estimation, is 55 to 60%. The  left ventricle has normal function. Left ventricular endocardial border not optimally defined to evaluate regional wall motion. Left ventricular diastolic parameters are consistent with Grade I diastolic dysfunction (impaired relaxation).  2. Right ventricular systolic function is normal. The right ventricular size is normal. There is normal pulmonary artery systolic pressure. The estimated right ventricular systolic pressure is 35.8 mmHg.  3. The mitral valve is degenerative. Trivial mitral valve regurgitation.  4. The aortic valve was not well visualized. There is moderate calcification of the aortic valve. Aortic valve regurgitation is not visualized. Mild aortic valve stenosis. Aortic valve mean gradient measures 10.0 mmHg. Dimentionless index 0.45.  5. The inferior vena cava is dilated in size with <50% respiratory variability, suggesting right atrial pressure of 15 mmHg. Comparison(s): Prior images unable to be directly viewed. FINDINGS  Left Ventricle: Left ventricular ejection fraction, by estimation, is 55 to 60%. The left ventricle has normal function. Left ventricular endocardial border not optimally defined to evaluate regional wall motion. The left ventricular internal cavity size was normal in size. Suboptimal image quality limits for assessment of left ventricular hypertrophy. Left ventricular diastolic parameters are consistent with Grade I diastolic dysfunction (impaired relaxation). Right Ventricle: The right ventricular size is normal. No increase in right ventricular wall thickness. Right ventricular  systolic function is normal. There is normal pulmonary artery systolic pressure. The tricuspid regurgitant velocity is 2.28 m/s, and  with an assumed right atrial pressure of 15 mmHg, the estimated right ventricular systolic pressure is 35.8 mmHg. Left Atrium: Left atrial size was normal in size. Right Atrium: Right atrial size was normal in size. Pericardium: There is no evidence of pericardial effusion. Presence of epicardial fat layer. Mitral Valve: The mitral valve is degenerative in appearance. There is mild thickening of the mitral valve leaflet(s). Mild mitral annular calcification. Trivial mitral valve regurgitation. Tricuspid Valve: The tricuspid valve is grossly normal. Tricuspid valve regurgitation is mild. Aortic Valve: The aortic valve was not well visualized. There is moderate calcification of the aortic valve. There is mild to moderate aortic valve annular calcification. Aortic valve regurgitation is not visualized. Mild aortic stenosis is present. Aortic valve mean gradient measures 10.0 mmHg. Aortic valve peak gradient measures 17.3 mmHg. Aortic valve area, by VTI measures 1.27 cm. Pulmonic Valve: The pulmonic valve was not well visualized. Pulmonic valve regurgitation is not visualized. Aorta: The aortic root was not well visualized and the ascending aorta was not well visualized. Venous: The inferior vena cava is dilated in size with less than 50% respiratory variability, suggesting right atrial pressure of 15 mmHg. IAS/Shunts: No atrial level shunt detected by color flow Doppler.  LEFT VENTRICLE PLAX 2D LVOT diam:     1.90 cm   Diastology LV SV:         51        LV e' medial:    6.85 cm/s LV SV Index:   31        LV E/e' medial:  8.4 LVOT Area:     2.84 cm  LV e' lateral:   9.03 cm/s                          LV E/e' lateral: 6.4  RIGHT VENTRICLE RV S prime:     12.60 cm/s TAPSE (M-mode): 1.8 cm LEFT ATRIUM             Index        RIGHT ATRIUM  Index LA Vol (A2C):   35.8 ml 21.43 ml/m   RA Area:     9.45 cm LA Vol (A4C):   45.5 ml 27.24 ml/m  RA Volume:   17.00 ml 10.18 ml/m LA Biplane Vol: 41.0 ml 24.54 ml/m  AORTIC VALVE AV Area (Vmax):    1.20 cm AV Area (Vmean):   1.28 cm AV Area (VTI):     1.27 cm AV Vmax:           208.00 cm/s AV Vmean:          156.000 cm/s AV VTI:            0.405 m AV Peak Grad:      17.3 mmHg AV Mean Grad:      10.0 mmHg LVOT Vmax:         88.10 cm/s LVOT Vmean:        70.600 cm/s LVOT VTI:          0.181 m LVOT/AV VTI ratio: 0.45 MITRAL VALVE               TRICUSPID VALVE MV Area (PHT): 2.79 cm    TR Peak grad:   20.8 mmHg MV Decel Time: 272 msec    TR Vmax:        228.00 cm/s MV E velocity: 57.40 cm/s MV A velocity: 72.80 cm/s  SHUNTS MV E/A ratio:  0.79        Systemic VTI:  0.18 m                            Systemic Diam: 1.90 cm Nona Dell MD Electronically signed by Nona Dell MD Signature Date/Time: 01/17/2023/1:04:00 PM    Final    CT CHEST WO CONTRAST  Result Date: 01/16/2023 CLINICAL DATA:  Dyspnea, multiple myeloma, spinal fracture EXAM: CT CHEST WITHOUT CONTRAST TECHNIQUE: Multidetector CT imaging of the chest was performed following the standard protocol without IV contrast. RADIATION DOSE REDUCTION: This exam was performed according to the departmental dose-optimization program which includes automated exposure control, adjustment of the mA and/or kV according to patient size and/or use of iterative reconstruction technique. COMPARISON:  MRI 07/24/2018, skeletal survey 11/03/2022 FINDINGS: Cardiovascular: Moderate multi-vessel coronary artery calcification. Global cardiac size within normal limits. Trace pericardial effusion. Central pulmonary arteries are of normal caliber. Moderate atherosclerotic calcification within the thoracic aorta. No aortic aneurysm. Mediastinum/Nodes: Visualized thyroid is unremarkable. No pathologic thoracic adenopathy. Esophagus unremarkable. Lungs/Pleura: Small partially loculated posterobasal pleural  effusions are present. There is bilateral posterior basal lower lobe consolidation with endobronchial debris which may reflect changes of superimposed multifocal infection or aspiration. Pulmonary hyperinflation noted in keeping with probable air trapping in the setting of COPD. Lobulated subpleural pulmonary nodule within the right upper lobe measures 10 mm at axial image # 44/5, indeterminate. Additional indeterminate nodular density at the left apex, axial image # 33/5 measures 9 mm in mean diameter. No pneumothorax. No central obstructing lesion. Upper Abdomen: No acute abnormality. Musculoskeletal: Expansile lytic mass within the anterior right seventh rib demonstrates significant soft tissue component measuring 4.2 x 5.1 cm at axial image # 52/3. There is expansile changes involving the anterior left seventh and sixth ribs likely related to changes related to the patient's underlying myeloma. Similarly, lytic lesions have developed within the thoracic spine, most notably at T7. There is diffuse osteopenia of the thoracic spine. Progressive loss of height of the T8 and T9 vertebral bodies with  accentuated thoracic kyphosis. Remote healed sternal manubrial fracture. Remote L1 superior and inferior endplate fractures. No acute bone abnormality. IMPRESSION: 1. Bilateral posterior basal lower lobe consolidation with endobronchial debris which may reflect changes of superimposed multifocal infection or aspiration. 2. Small partially loculated posterobasal pleural effusions. 3. Moderate multi-vessel coronary artery calcification. 4. Pulmonary hyperinflation in keeping with probable air trapping in keeping with COPD. 5. Expansile lytic mass within the anterior right seventh rib with significant soft tissue component measuring 4.2 x 5.1 cm. There is also expansile changes involving the anterior left seventh and sixth ribs. Similarly, lytic lesions have developed within the thoracic spine, most notably at T7. Findings  are in keeping with the patient's underlying myeloma. 6. Progressive loss of height of the T8 and T9 vertebral bodies with accentuated thoracic kyphosis. 7. Remote healed sternal manubrial fracture. Remote L1 superior and inferior endplate fractures. 8. Lobulated subpleural pulmonary nodule within the right upper lobe measures 10 mm at axial image # 44/5, indeterminate. Additional indeterminate nodular density at the left apex, axial image # 33/5 measures 9 mm in mean diameter. Consider one of the following in 3 months for both low-risk and high-risk individuals: (a) repeat chest CT, (b) follow-up PET-CT, or (c) tissue sampling. This recommendation follows the consensus statement: Guidelines for Management of Incidental Pulmonary Nodules Detected on CT Images: From the Fleischner Society 2017; Radiology 2017; 284:228-243. Aortic Atherosclerosis (ICD10-I70.0). Electronically Signed   By: Helyn Numbers M.D.   On: 01/16/2023 22:59   DG Chest Port 1 View  Result Date: 01/16/2023 CLINICAL DATA:  Cough and shortness of breath. EXAM: PORTABLE CHEST 1 VIEW COMPARISON:  Chest radiograph 07/16/2017, chest from bone survey 11/03/2022 FINDINGS: Chronic hyperinflation. The heart is upper normal in size. Stable mediastinal contours. Increasing opacity at the periphery of the lung bases may represent ill-defined airspace disease and small effusions. No pulmonary edema. No pneumothorax. Patient's known bone lesions are not well characterized on the current exam. IMPRESSION: 1. Increasing opacity at the periphery of the lung bases may represent ill-defined airspace disease and small effusions. 2. Chronic hyperinflation. Electronically Signed   By: Narda Rutherford M.D.   On: 01/16/2023 18:18     Scheduled Meds:  acetaminophen  1,000 mg Oral TID   Chlorhexidine Gluconate Cloth  6 each Topical Q0600   guaiFENesin  600 mg Oral BID   Continuous Infusions:  azithromycin Stopped (01/17/23 2139)   ceFEPime (MAXIPIME) IV  Stopped (01/18/23 0406)     LOS: 2 days   Time spent: 33  Rhetta Mura, MD [facility] To contact the attending provider between 7A-7P or the covering provider during after hours 7P-7A, please log into the web site [email or web address] and access using universal [place] password for that web site. If you do not have the password, please call the hospital operator.  01/18/2023, 10:21 AM

## 2023-01-19 DIAGNOSIS — J189 Pneumonia, unspecified organism: Secondary | ICD-10-CM | POA: Diagnosis not present

## 2023-01-19 LAB — CBC WITH DIFFERENTIAL/PLATELET
Abs Immature Granulocytes: 0.07 10*3/uL (ref 0.00–0.07)
Basophils Absolute: 0 10*3/uL (ref 0.0–0.1)
Basophils Relative: 0 %
Eosinophils Absolute: 0.5 10*3/uL (ref 0.0–0.5)
Eosinophils Relative: 8 %
HCT: 36.1 % (ref 36.0–46.0)
Hemoglobin: 11.2 g/dL — ABNORMAL LOW (ref 12.0–15.0)
Immature Granulocytes: 1 %
Lymphocytes Relative: 15 %
Lymphs Abs: 0.9 10*3/uL (ref 0.7–4.0)
MCH: 32.5 pg (ref 26.0–34.0)
MCHC: 31 g/dL (ref 30.0–36.0)
MCV: 104.6 fL — ABNORMAL HIGH (ref 80.0–100.0)
Monocytes Absolute: 0.5 10*3/uL (ref 0.1–1.0)
Monocytes Relative: 9 %
Neutro Abs: 4.3 10*3/uL (ref 1.7–7.7)
Neutrophils Relative %: 67 %
Platelets: 128 10*3/uL — ABNORMAL LOW (ref 150–400)
RBC: 3.45 MIL/uL — ABNORMAL LOW (ref 3.87–5.11)
RDW: 14.9 % (ref 11.5–15.5)
WBC: 6.3 10*3/uL (ref 4.0–10.5)
nRBC: 0.3 % — ABNORMAL HIGH (ref 0.0–0.2)

## 2023-01-19 LAB — COMPREHENSIVE METABOLIC PANEL
ALT: 24 U/L (ref 0–44)
AST: 25 U/L (ref 15–41)
Albumin: 2.5 g/dL — ABNORMAL LOW (ref 3.5–5.0)
Alkaline Phosphatase: 40 U/L (ref 38–126)
Anion gap: 8 (ref 5–15)
BUN: 21 mg/dL (ref 8–23)
CO2: 22 mmol/L (ref 22–32)
Calcium: 7.9 mg/dL — ABNORMAL LOW (ref 8.9–10.3)
Chloride: 107 mmol/L (ref 98–111)
Creatinine, Ser: 0.62 mg/dL (ref 0.44–1.00)
GFR, Estimated: >60 mL/min (ref >60)
Glucose, Bld: 100 mg/dL — ABNORMAL HIGH (ref 70–99)
Potassium: 3.5 mmol/L (ref 3.5–5.1)
Sodium: 137 mmol/L (ref 135–145)
Total Bilirubin: 0.7 mg/dL (ref 0.3–1.2)
Total Protein: 4.7 g/dL — ABNORMAL LOW (ref 6.5–8.1)

## 2023-01-19 LAB — CULTURE, BLOOD (ROUTINE X 2)
Culture: NO GROWTH
Special Requests: ADEQUATE

## 2023-01-19 LAB — HEMOGLOBIN A1C
Hgb A1c MFr Bld: 5.9 % — ABNORMAL HIGH (ref 4.8–5.6)
Mean Plasma Glucose: 123 mg/dL

## 2023-01-19 MED ORDER — LOPERAMIDE HCL 2 MG PO CAPS
2.0000 mg | ORAL_CAPSULE | Freq: Once | ORAL | Status: AC
  Administered 2023-01-19: 2 mg via ORAL
  Filled 2023-01-19: qty 1

## 2023-01-19 MED ORDER — LOPERAMIDE HCL 2 MG PO CAPS: 2.0000 mg | ORAL_CAPSULE | Freq: Three times a day (TID) | ORAL | Status: DC | PRN

## 2023-01-19 MED ORDER — FAMOTIDINE 20 MG PO TABS
20.0000 mg | ORAL_TABLET | Freq: Every day | ORAL | Status: DC
  Administered 2023-01-19 – 2023-01-20 (×2): 20 mg via ORAL
  Filled 2023-01-19 (×2): qty 1

## 2023-01-19 NOTE — Progress Notes (Signed)
Mobility Specialist - Progress Note   01/19/23 1341  Mobility  Activity Ambulated with assistance in hallway  Level of Assistance Standby assist, set-up cues, supervision of patient - no hands on  Assistive Device Cane  Distance Ambulated (ft) 500 ft  Range of Motion/Exercises Active  Activity Response Tolerated well  Mobility Referral Yes  $Mobility charge 1 Mobility  Mobility Specialist Start Time (ACUTE ONLY) 1310  Mobility Specialist Stop Time (ACUTE ONLY) 1321  Mobility Specialist Time Calculation (min) (ACUTE ONLY) 11 min   Pt received in bed and agreed to mobility. Had no issues throughout session, returned to bed with all needs met.  Marilynne Halsted Mobility Specialist

## 2023-01-19 NOTE — TOC CM/SW Note (Signed)
Transition of Care [facility]) - Inpatient Brief Assessment  Patient Details  Name: Nicole Keith MRN: [id number] Date of Birth: May 06, 1941  Transition of [facility]) CM/SW Contact:    Ewing Schlein, LCSW Phone Number: 01/19/2023, 9:38 AM  Clinical Narrative: Screening completed. No TOC needs identified as patient has been weaned of oxygen at this time.  Transition of Care Asessment: Insurance and Status: Insurance coverage has been reviewed Patient has primary care physician: Yes Home environment has been reviewed: Yes, resides with spouse Prior level of function:: Independent with ADLs at baseline Prior/Current Home Services: No current home services Social Determinants of Health Reivew: SDOH reviewed no interventions necessary Readmission risk has been reviewed: Yes Transition of care needs: no transition of care needs at this time

## 2023-01-19 NOTE — Plan of Care (Signed)

## 2023-01-19 NOTE — Progress Notes (Signed)
PROGRESS NOTE   Nicole Keith  ZOX:[id number] DOB: February 13, 1941 DOA: 01/16/2023 PCP: Farris Has, MD  Brief Narrative:   82 year old home dwelling white female History of collagenous colitis in 1990s, femoral neck fracture 2014, squamous cell CA, osteoporosis IgG lambda MM-Dr. Jonny Ruiz Dorsey-diagnosis 07/2018 failure Velcade/dexamethasone-second line therapy Revlimid subsequently Pomalyst/Ninlaro--progression of disease 11/06/2022 and discontinuation of Darzalex-currently on Krypolis/dexamethasone OV oncology 6/01/15/2023-suggested Zometa therapy 2/2 lytic lesions/pain  Present from home SOB status post chemo-O2 sat 80%-requiring 6L [place]-Tmax 102 Lactic acid 2.5, CO2 21 BUN/creatinine 35/1.7 (baseline 9/0.8) troponin 207-->180, BNP 191 Dimer elevated 3.3 WBC 18 hemoglobin 12 CXR opacity periphery lung bases = airspace disease?  Effusion  Rx ceftriaxone azithromycin NS 500 cc bolus in ED  2/2 ? troponin, echocardiogram ordered EKG my over read sinus rhythm PR interval 0.20 no ST-T wave changes-compared to EKG 6/3-no overt difference  6/8 Echo EF 55-60%, Mild Aov stenosis, 6/8 duplex lower extremities negative for DVT   Hospital-Problem based course  Acute hypoxic respiratory failure on admission-2/2 PNA Sepsis on admission 2/2 PNA---Lactic acidosis--WBC improved--- no oxygen 6/9 Strp PNa neg, pending legionella Iv Abx--> Levaquin--Tylenol for fever --- continue Mucinex 600 twice daily, DuoNebs every 4 as needed--chest physio as able Not sputum so cancelled order  Elevated troponin 200 range, likely NSTEMI DDx infection DDx PE EKG shows no ST-T wave changes-echo -suspect NSTEMI as no overt chest pain on discussion with patient-hold GDMT heparin/nitroglycerin etc. for now Hold BB--no WMA  IgG lambda myeloma on third line therapies-seen by Dr. Mayra Neer metastatic pain Appreciate oncology expertise-she tells me her paiin is better--prefers to defer Zometa to the OP setting D-dimer  elevated-duplex lower extremities negative for DVT  Thrombocytopenia-new Baseline is about 170-261 Dropped to 128 this morning--- monitor trend overnight and if resolved can discharge home  AKI on admission + mild metabolic acidosis NSL--Acidosis resolved  Prior collagenous colitis--- Takes OTC Imodium and had some episodes of diarrhea 6/10 so we will resume Imodium 3 times daily as needed Do not suspect infectious as stool is semiformed  Prior hip fracture in 2014-stable and improved up out of bed  Follow A1c  DVT prophylaxis: Lovenox Code Status: Full code confirmed Family Communication: None Disposition:  Status is: Inpatient Remains inpatient appropriate because:   DC a.m.    Subjective:  Looks well-comfortable-sharing stories about herself and her husband with me in full sentences No chest pain Walked around yesterday with her cane and is willing to walk around today  Objective: Vitals:   01/19/23 0400 01/19/23 0437 01/19/23 0740 01/19/23 0825  BP: (!) 123/52   (!) 163/83  Pulse: 61   66  Resp: 16   17  Temp: 97.7 F (36.5 C)  98 F (36.7 C)   TempSrc: Oral  Oral   SpO2: 93%   97%  Weight:  61.6 kg    Height:       No intake or output data in the 24 hours ending 01/19/23 0925  Filed Weights   01/17/23 0008 01/17/23 0500 01/19/23 0437  Weight: 59.4 kg 59.4 kg 61.6 kg    Examination:  Pleasant white female no distress neck soft supple Chest clear no wheeze rales rhonchi S1-S2?  HSM LUSB Abdomen soft no rebound No lower extremity edema Neuro grossly intact power   Data Reviewed: personally reviewed   CBC    Component Value Date/Time   WBC 6.3 01/19/2023 0306   RBC 3.45 (L) 01/19/2023 0306   HGB 11.2 (L) 01/19/2023 0306   HGB  13.0 01/15/2023 0745   HGB 10.8 (L) 06/11/2017 0753   HCT 36.1 01/19/2023 0306   HCT 32.2 (L) 06/11/2017 0753   PLT 128 (L) 01/19/2023 0306   PLT 566 (H) 01/15/2023 0745   PLT 321 06/11/2017 0753   MCV 104.6 (H)  01/19/2023 0306   MCV 95.4 06/11/2017 0753   MCH 32.5 01/19/2023 0306   MCHC 31.0 01/19/2023 0306   RDW 14.9 01/19/2023 0306   RDW 15.6 (H) 06/11/2017 0753   LYMPHSABS 0.9 01/19/2023 0306   LYMPHSABS 2.1 06/11/2017 0753   MONOABS 0.5 01/19/2023 0306   MONOABS 0.5 06/11/2017 0753   EOSABS 0.5 01/19/2023 0306   EOSABS 0.1 06/11/2017 0753   BASOSABS 0.0 01/19/2023 0306   BASOSABS 0.1 06/11/2017 0753      Latest Ref Rng & Units 01/19/2023    3:06 AM 01/18/2023    3:06 AM 01/17/2023    8:36 AM  CMP  Glucose 70 - 99 mg/dL 161  98    BUN 8 - 23 mg/dL 21  26    Creatinine 0.96 - 1.00 mg/dL 0.45  4.09  8.11   Sodium 135 - 145 mmol/L 137  136    Potassium 3.5 - 5.1 mmol/L 3.5  3.5    Chloride 98 - 111 mmol/L 107  107    CO2 22 - 32 mmol/L 22  22    Calcium 8.9 - 10.3 mg/dL 7.9  7.5    Total Protein 6.5 - 8.1 g/dL 4.7  4.5    Total Bilirubin 0.3 - 1.2 mg/dL 0.7  0.7    Alkaline Phos 38 - 126 U/L 40  33    AST 15 - 41 U/L 25  20    ALT 0 - 44 U/L 24  21       Radiology Studies: VAS [place] LOWER EXTREMITY VENOUS (DVT)  Result Date: 01/18/2023  Lower Venous DVT Study Patient Name:  ANGELISSE COLLIER  Date of Exam:   01/17/2023 Medical Rec #: [id number]         Accession #:    [id number] Date of Birth: [birth year]         Patient Gender: F Patient Age:   86 years Exam Location:  [facility] Procedure:      VAS [place] LOWER EXTREMITY VENOUS (DVT) Referring Phys: Rhetta Mura --------------------------------------------------------------------------------  Indications: Elevated D-Dimer.  Comparison Study: No previous study. Performing Technologist: McKayla Maag RVT, VT  Examination Guidelines: A complete evaluation includes B-mode imaging, spectral Doppler, color Doppler, and power Doppler as needed of all accessible portions of each vessel. Bilateral testing is considered an integral part of a complete examination. Limited examinations for reoccurring indications may be performed as noted. The  reflux portion of the exam is performed with the patient in reverse Trendelenburg.  +---------+---------------+---------+-----------+----------+--------------+ RIGHT    CompressibilityPhasicitySpontaneityPropertiesThrombus Aging +---------+---------------+---------+-----------+----------+--------------+ CFV      Full           Yes      Yes                                 +---------+---------------+---------+-----------+----------+--------------+ SFJ      Full                                                        +---------+---------------+---------+-----------+----------+--------------+  FV Prox  Full                                                        +---------+---------------+---------+-----------+----------+--------------+ FV Mid   Full                                                        +---------+---------------+---------+-----------+----------+--------------+ FV DistalFull                                                        +---------+---------------+---------+-----------+----------+--------------+ PFV      Full                                                        +---------+---------------+---------+-----------+----------+--------------+ POP      Full           Yes      Yes                                 +---------+---------------+---------+-----------+----------+--------------+ PTV      Full                                                        +---------+---------------+---------+-----------+----------+--------------+ PERO     Full                                                        +---------+---------------+---------+-----------+----------+--------------+   +---------+---------------+---------+-----------+----------+--------------+ LEFT     CompressibilityPhasicitySpontaneityPropertiesThrombus Aging +---------+---------------+---------+-----------+----------+--------------+ CFV      Full           Yes       Yes                                 +---------+---------------+---------+-----------+----------+--------------+ SFJ      Full                                                        +---------+---------------+---------+-----------+----------+--------------+ FV Prox  Full                                                        +---------+---------------+---------+-----------+----------+--------------+  FV Mid   Full                                                        +---------+---------------+---------+-----------+----------+--------------+ FV DistalFull                                                        +---------+---------------+---------+-----------+----------+--------------+ PFV      Full                                                        +---------+---------------+---------+-----------+----------+--------------+ POP      Full           Yes      Yes                                 +---------+---------------+---------+-----------+----------+--------------+ PTV      Full                                                        +---------+---------------+---------+-----------+----------+--------------+ PERO     Full                                                        +---------+---------------+---------+-----------+----------+--------------+     Summary: BILATERAL: - No evidence of deep vein thrombosis seen in the lower extremities, bilaterally. - No evidence of superficial venous thrombosis in the lower extremities, bilaterally. -No evidence of popliteal cyst, bilaterally.   *See table(s) above for measurements and observations. Electronically signed by Gerarda Fraction on 01/18/2023 at 3:56:16 PM.    Final      Scheduled Meds:  acetaminophen  1,000 mg Oral TID   Chlorhexidine Gluconate Cloth  6 each Topical Q0600   famotidine  20 mg Oral Daily   guaiFENesin  600 mg Oral BID   levofloxacin  500 mg Oral Daily   loperamide  2 mg Oral Once    Continuous Infusions:     LOS: 3 days   Time spent: 40  Rhetta Mura, MD [facility] To contact the attending provider between 7A-7P or the covering provider during after hours 7P-7A, please log into the web site [email or web address] and access using universal [place] password for that web site. If you do not have the password, please call the hospital operator.  01/19/2023, 9:25 AM

## 2023-01-20 ENCOUNTER — Encounter: Payer: Self-pay | Admitting: Hematology and Oncology

## 2023-01-20 ENCOUNTER — Other Ambulatory Visit (HOSPITAL_COMMUNITY): Payer: Self-pay

## 2023-01-20 ENCOUNTER — Other Ambulatory Visit: Payer: Self-pay

## 2023-01-20 DIAGNOSIS — J189 Pneumonia, unspecified organism: Secondary | ICD-10-CM | POA: Diagnosis not present

## 2023-01-20 LAB — COMPREHENSIVE METABOLIC PANEL
ALT: 32 U/L (ref 0–44)
AST: 33 U/L (ref 15–41)
Albumin: 2.6 g/dL — ABNORMAL LOW (ref 3.5–5.0)
Alkaline Phosphatase: 47 U/L (ref 38–126)
Anion gap: 6 (ref 5–15)
BUN: 13 mg/dL (ref 8–23)
CO2: 25 mmol/L (ref 22–32)
Calcium: 7.9 mg/dL — ABNORMAL LOW (ref 8.9–10.3)
Chloride: 107 mmol/L (ref 98–111)
Creatinine, Ser: 0.58 mg/dL (ref 0.44–1.00)
GFR, Estimated: >60 mL/min (ref >60)
Glucose, Bld: 101 mg/dL — ABNORMAL HIGH (ref 70–99)
Potassium: 3.8 mmol/L (ref 3.5–5.1)
Sodium: 138 mmol/L (ref 135–145)
Total Bilirubin: 1.1 mg/dL (ref 0.3–1.2)
Total Protein: 4.7 g/dL — ABNORMAL LOW (ref 6.5–8.1)

## 2023-01-20 LAB — CBC WITH DIFFERENTIAL/PLATELET
Abs Immature Granulocytes: 0.07 10*3/uL (ref 0.00–0.07)
Basophils Absolute: 0 10*3/uL (ref 0.0–0.1)
Basophils Relative: 0 %
Eosinophils Absolute: 0.4 10*3/uL (ref 0.0–0.5)
Eosinophils Relative: 7 %
HCT: 33.4 % — ABNORMAL LOW (ref 36.0–46.0)
Hemoglobin: 10.9 g/dL — ABNORMAL LOW (ref 12.0–15.0)
Immature Granulocytes: 1 %
Lymphocytes Relative: 16 %
Lymphs Abs: 0.9 10*3/uL (ref 0.7–4.0)
MCH: 32.1 pg (ref 26.0–34.0)
MCHC: 32.6 g/dL (ref 30.0–36.0)
MCV: 98.2 fL (ref 80.0–100.0)
Monocytes Absolute: 0.7 10*3/uL (ref 0.1–1.0)
Monocytes Relative: 12 %
Neutro Abs: 3.8 10*3/uL (ref 1.7–7.7)
Neutrophils Relative %: 64 %
Platelets: 121 10*3/uL — ABNORMAL LOW (ref 150–400)
RBC: 3.4 MIL/uL — ABNORMAL LOW (ref 3.87–5.11)
RDW: 14.9 % (ref 11.5–15.5)
WBC: 6 10*3/uL (ref 4.0–10.5)
nRBC: 0 % (ref 0.0–0.2)

## 2023-01-20 LAB — LEGIONELLA PNEUMOPHILA SEROGP 1 UR AG: L. pneumophila Serogp 1 Ur Ag: NEGATIVE

## 2023-01-20 LAB — CULTURE, BLOOD (ROUTINE X 2): Special Requests: ADEQUATE

## 2023-01-20 MED ORDER — FAMOTIDINE 20 MG PO TABS
20.0000 mg | ORAL_TABLET | Freq: Every day | ORAL | 0 refills | Status: AC
  Filled 2023-01-20: qty 30, 30d supply, fill #0

## 2023-01-20 MED ORDER — LEVOFLOXACIN 500 MG PO TABS
500.0000 mg | ORAL_TABLET | Freq: Every day | ORAL | 0 refills | Status: AC
  Filled 2023-01-20: qty 3, 3d supply, fill #0

## 2023-01-20 MED ORDER — GUAIFENESIN ER 600 MG PO TB12
600.0000 mg | ORAL_TABLET | Freq: Two times a day (BID) | ORAL | 0 refills | Status: AC
  Filled 2023-01-20: qty 40, 20d supply, fill #0

## 2023-01-20 MED ORDER — LOPERAMIDE HCL 2 MG PO CAPS
2.0000 mg | ORAL_CAPSULE | Freq: Three times a day (TID) | ORAL | 1 refills | Status: AC | PRN
  Filled 2023-01-20: qty 30, 10d supply, fill #0

## 2023-01-20 NOTE — Care Management Important Message (Signed)
Important Message  Patient Details IM Letter given. Name: Nicole Keith MRN: [id number] Date of Birth: 1940/09/14   Medicare Important Message Given:  Yes     Caren Macadam 01/20/2023, 9:27 AM

## 2023-01-20 NOTE — Progress Notes (Signed)
Mobility Specialist - Progress Note   01/20/23 0845  Mobility  Activity Ambulated with assistance in hallway  Level of Assistance Standby assist, set-up cues, supervision of patient - no hands on  Assistive Device Cane  Distance Ambulated (ft) 500 ft  Range of Motion/Exercises Active  Activity Response Tolerated well  Mobility Referral Yes  $Mobility charge 1 Mobility  Mobility Specialist Start Time (ACUTE ONLY) 0830  Mobility Specialist Stop Time (ACUTE ONLY) 0845  Mobility Specialist Time Calculation (min) (ACUTE ONLY) 15 min   Pt received in bed and agreed to mobility. Had no issues throughout session, pt returned to bed with all needs met.  Marilynne Halsted Mobility Specialist

## 2023-01-21 ENCOUNTER — Telehealth: Payer: Self-pay | Admitting: Hematology and Oncology

## 2023-01-21 NOTE — Telephone Encounter (Signed)
Patient wanted confirmation regarding her appointment time/dates.

## 2023-01-22 ENCOUNTER — Other Ambulatory Visit: Payer: Medicare Other

## 2023-01-22 ENCOUNTER — Ambulatory Visit: Payer: Medicare Other

## 2023-01-22 LAB — CULTURE, BLOOD (ROUTINE X 2): Culture: NO GROWTH

## 2023-01-23 ENCOUNTER — Other Ambulatory Visit (HOSPITAL_COMMUNITY): Payer: Medicare Other

## 2023-01-28 NOTE — Progress Notes (Unsigned)
[facility] [phone]   [phone]  PROGRESS NOTE  Patient Care Team: Farris Has, MD as PCP - General (Family Medicine)  CHIEF COMPLAINTS/PURPOSE OF CONSULTATION:  IgG Lambda Multiple Myeloma  ONCOLOGIC HISTORY: 07/27/2018-09/2018: Received weekly velcade plus dexamethasone. Therapy changed due to poor response.  09/2018: Started Revlimid 25 mg for 21 days on, 7 days off of a 28 day cycle with dexamethasone. Therapy was held after 2 cycles because of severe dermatological toxicity 12/2018: Pomalyst 1 mg daily for 21 days.  She completed 4 cycles of therapy and therapy was held due to occurrence of dermatological toxicity.  07/2019: Started Ninlaro 4 mg weekly with dexamethasone 20 mg weekly for 3 weeks on and 1 week off. Daratumumab was addd in April 2021. Clearnce Sorrel was discontinued in November 2021. 05/18/2020: Started monthly daratumumab and transitioned to q 3 month schedule in April 2022.  11/06/2022: Darzalex discontinued due to progression of disease. M protein 0.9 with worsening lytic lesions in the skeleton.  11/20/2022: Cycle 1 Day 1 of Kyprolis/Dex therapy.  12/19/2022: Cycle 2 Day 1 of Kyprolis/Dex therapy 01/15/2023: Cycle 3 Day 1 of Kyprolis/Dex therapy  HISTORY OF PRESENTING ILLNESS:  Nicole Keith 82 y.o. female returns for a follow up for IgG lambda multiple myeloma. She was last seen on 01/15/2023. She presents today for Cycle 4, Day 1 of Kyprolis/Dex.   On exam today, Ms. Delbridge reports he was in the hospital last week for pneumonia when she "could not breathe through her mouth or nose".  She does that she is released last Tuesday and has made a remarkable recovery since that time.  She does that she is "back to 100%".  She is willing and able to restart chemotherapy treatment at this time.  She notes that she did go home with antibiotic therapy with Levaquin as well as Pepcid and Mucinex.  She notes that she continues to have rash  everywhere on her upper extremities and chest predominantly.  She notes that she has not been using her steroid cream as frequently when she was in the hospital.  Overall she is willing and able to proceed with treatment today. She denies fevers, chills, sweats, shortness of breath, chest pain or cough. She has no other complaints. Rest of the 10 point ROS is below.  MEDICAL HISTORY:  Past Medical History:  Diagnosis Date   Allergic rhinitis    Arthritis    Colitis, collagenous    Diverticular disease    severe sig tics/fixation 2012   Family hx of colon cancer    Fracture of femoral neck, right (HCC) 01/09/2013   GERD (gastroesophageal reflux disease)    exacerbation of reflux-like symptoms 04/2011   Hiatal hernia    Hyperlipidemia    muliplt myelom 2020   Osteoarthritis of right hip 01/10/2013    SURGICAL HISTORY: Past Surgical History:  Procedure Laterality Date   CHOLECYSTECTOMY     EYE SURGERY  feb 2016   cataract right eye   LEG SURGERY Left    TONSILLECTOMY     TOTAL HIP ARTHROPLASTY Right 01/10/2013   Procedure: TOTAL HIP ARTHROPLASTY;  Surgeon: Eulas Post, MD;  Location: [facility] OR;  Service: Orthopedics;  Laterality: Right;    SOCIAL HISTORY: Social History   Socioeconomic History   Marital status: Married    Spouse name: Not on file   Number of children: 0   Years of education: Not on file   Highest education level: Some college, no degree  Occupational History   Occupation: retired  Tobacco Use   Smoking status: Former    Types: Cigarettes    Quit date: 01/10/1972    Years since quitting: 51.0   Smokeless tobacco: Never   Tobacco comments:    quit 1973  Vaping Use   Vaping Use: Never used  Substance and Sexual Activity   Alcohol use: No   Drug use: No   Sexual activity: Not on file  Other Topics Concern   Not on file  Social History Narrative   Married, no children. Drinks 2 cups decaf coffee a day. Walks daily. Is active, plays golf, she and her  husband go hiking frequently. 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   Social Determinants of Health   Financial Resource Strain: Not on file  Food Insecurity: No Food Insecurity (01/17/2023)   Hunger Vital Sign    Worried About Running Out of Food in the Last Year: Never true    Ran Out of Food in the Last Year: Never true  Transportation Needs: No Transportation Needs (01/17/2023)   PRAPARE - Administrator, Civil Service (Medical): No    Lack of Transportation (Non-Medical): No  Physical Activity: Not on file  Stress: Not on file  Social Connections: Not on file  Intimate Partner Violence: Not At Risk (01/17/2023)   Humiliation, Afraid, Rape, and Kick questionnaire    Fear of Current or Ex-Partner: No    Emotionally Abused: No    Physically Abused: No    Sexually Abused: No    FAMILY HISTORY: Family History  Problem Relation Age of Onset   Congestive Heart Failure Mother    Arthritis-Osteo Mother    Colon cancer Mother    Heart attack Father    Rheum arthritis Father    Colon cancer Maternal Aunt    Colon cancer Maternal Aunt    Hypothyroidism Sister     ALLERGIES:  is allergic to kyprolis [carfilzomib], pomalyst [pomalidomide], revlimid [lenalidomide], betadine [povidone iodine], flagyl [metronidazole], fosamax [alendronate sodium], bactrim [sulfamethoxazole-trimethoprim], clindamycin/lincomycin, doxepin hcl, hydrocodone-acetaminophen, hydroxyzine, hydroxyzine hcl, tramadol hcl, valium [diazepam], actonel [risedronate sodium], boniva [ibandronic acid], doxycycline, and penicillins.  MEDICATIONS:  Current Outpatient Medications  Medication Sig Dispense Refill   acetaminophen (TYLENOL) 500 MG tablet Take 1,000 mg by mouth See admin instructions. Take 1,000 mg by mouth after breakfast, at 3:30 PM, and at 8:30 PM     acyclovir (ZOVIRAX) 400 MG tablet Take 1 tablet (400 mg total) by mouth 2 (two) times daily. (Patient not taking: Reported on 01/16/2023)  60 tablet 3   famotidine (PEPCID) 20 MG tablet Take 1 tablet (20 mg total) by mouth daily. 30 tablet 0   famotidine-calcium carbonate-magnesium hydroxide (PEPCID COMPLETE) 10-800-165 MG chewable tablet Chew 1 tablet by mouth daily as needed (for heartburn or indigestion).      guaiFENesin (MUCINEX) 600 MG 12 hr tablet Take 1 tablet (600 mg total) by mouth 2 (two) times daily. 40 tablet 0   loperamide (IMODIUM) 2 MG capsule Take 1 capsule (2 mg total) by mouth 3 (three) times daily between meals as needed for diarrhea or loose stools. 30 capsule 1   triamcinolone cream (KENALOG) 0.1 % Apply 1 Application topically 2 (two) times daily. (Patient taking differently: Apply 1 Application topically 2 (two) times daily as needed (for allergic flares- affected areas).) 453.6 g 2   VASELINE PURE ULTRA WHITE ointment Apply 1 application  topically See admin instructions. Apply to the genitalia in the  morning and at bedtime     No current facility-administered medications for this visit.    REVIEW OF SYSTEMS:   Constitutional: ( - ) fevers, ( - )  chills , ( - ) night sweats Eyes: ( - ) blurriness of vision, ( - ) double vision, ( - ) watery eyes Ears, nose, mouth, throat, and face: ( - ) mucositis, ( - ) sore throat Respiratory: ( - ) cough, ( - ) dyspnea, ( - ) wheezes Cardiovascular: ( - ) palpitation, ( - ) chest discomfort, ( - ) lower extremity swelling Gastrointestinal:  ( - ) nausea, ( - ) heartburn, ( - ) change in bowel habits Skin: ( +) abnormal skin rashes Lymphatics: ( - ) new lymphadenopathy, ( - ) easy bruising Neurological: ( - ) numbness, ( - ) tingling, ( - ) new weaknesses Behavioral/Psych: ( - ) mood change, ( - ) new changes  All other systems were reviewed with the patient and are negative.  PHYSICAL EXAMINATION: ECOG PERFORMANCE STATUS: 1 - Symptomatic but completely ambulatory  Vitals:   01/29/23 1015  BP: 120/71  Pulse: 74  Resp: 18  Temp: 98.4 F (36.9 C)  SpO2: 97%      Filed Weights   01/29/23 1015  Weight: 132 lb 6.4 oz (60.1 kg)       GENERAL: well appearing female in NAD  SKIN: skin color, texture, turgor are normal. Diffuse, flat erythematous rash.  EYES: conjunctiva are pink and non-injected, sclera clear LYMPH:  no palpable lymphadenopathy in the cervical or supraclavicular lymph nodes.  LUNGS: clear to auscultation and percussion with normal breathing effort HEART: regular rate & rhythm and no murmurs and no lower extremity edema Musculoskeletal: no cyanosis of digits and no clubbing  PSYCH: alert & oriented x 3, fluent speech NEURO: no focal motor/sensory deficits  LABORATORY DATA:  I have reviewed the data as listed    Latest Ref Rng & Units 01/29/2023    9:58 AM 01/20/2023    5:34 AM 01/19/2023    3:06 AM  CBC  WBC 4.0 - 10.5 K/uL 9.0  6.0  6.3   Hemoglobin 12.0 - 15.0 g/dL 16.1  09.6  04.5   Hematocrit 36.0 - 46.0 % 39.0  33.4  36.1   Platelets 150 - 400 K/uL 522  121  128        Latest Ref Rng & Units 01/29/2023    9:58 AM 01/20/2023    5:34 AM 01/19/2023    3:06 AM  CMP  Glucose 70 - 99 mg/dL 409  811  914   BUN 8 - 23 mg/dL 11  13  21    Creatinine 0.44 - 1.00 mg/dL 7.82  9.56  2.13   Sodium 135 - 145 mmol/L 142  138  137   Potassium 3.5 - 5.1 mmol/L 4.0  3.8  3.5   Chloride 98 - 111 mmol/L 107  107  107   CO2 22 - 32 mmol/L 28  25  22    Calcium 8.9 - 10.3 mg/dL 8.6  7.9  7.9   Total Protein 6.5 - 8.1 g/dL 5.7  4.7  4.7   Total Bilirubin 0.3 - 1.2 mg/dL 1.0  1.1  0.7   Alkaline Phos 38 - 126 U/L 63  47  40   AST 15 - 41 U/L 22  33  25   ALT 0 - 44 U/L 24  32  24     ASSESSMENT & PLAN  RODAS UMBAUGH is a 82 y.o.female who presents to the clinic for follow up for multiple myeloma. She was under the care of Dr. Clelia Croft and will transfer to Dr. Leonides Schanz moving forward.   #IgG Lambda Multiple Myeloma: --Initially presented in April 2016 with M spike of 3.2 g/dL and IgG level of 1610. Underwent bone marrow biopsy  from 11/20/2014 show 29% plasma cells. 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  --In December 2019 with rising M spike of 5.9 g/dL and worsening anemia, with Hgb 8.7, she met criteria for transformation of active multiple myeloma.  --From 07/27/2018-09/2018, received weekly velcade plus dexamethasone. Therapy changed due to poor response.  --In 09/2018, started Revlimid 25 mg for 21 days on, 7 days off of a 28 day cycle with dexamethasone. Therapy was held after 2 cycles because of severe dermatological toxicity --In 12/2018, switched to Pomalyst 1 mg daily for 21 days.  She completed 4 cycles of therapy and therapy was held due to occurrence of dermatological toxicity.  --In 07/2019, switch to Ninlaro 4 mg weekly with dexamethasone 20 mg weekly for 3 weeks on and 1 week off. Daratumumab was addd in April 2021. Clearnce Sorrel was discontinued in November 2021. --On 05/18/2020, started monthly daratumumab and transitioned to q 3 month schedule in April 2022.  --On 11/20/2022, switch to Kyprolis/Dex due to rising M-protein and bone survey that showed worsening lytic lesions.  PLAN:   --Labs today show white blood cell count 9.0, hemoglobin 12.7, MCV 101.3, and platelets of 522 --Due Cycle 3 Day 15 of Kyprolis/Dex. Proceed without any dose modifications.  --Plan for repeat metastatic survey in late June 2024. --RTC in 2 weeks for Cycle 4 Day 1 of Kyprolis/Dex with continued weekly treatments.   #Diffuse erythematous rash-stable --Managed with kenalog cream. -- nystatin powder for groin area.  --Discussed referral to dermatology, patient deferred at this time.   #Supportive Care -- port not required but can be placed if requested.  -- zofran 8mg  q8H PRN and compazine 10mg  PO q6H for nausea -- acyclovir 400mg  PO BID for VCZ prophylaxis -- no pain medication required at this time.  -- discussed role for zometa therapy in the setting her lytic lesions. Explained zometa is given q 12 weeks  and requires a dental clearance.  After discussing this with the patient further today she knows she would like to hold on this for the time being as she is afraid of the side effects.  Strongly emphasized that she should reconsider as this will be helpful with her lytic bone lesions.  Orders Placed This Encounter  Procedures   Lipid panel    Standing Status:   Future    Standing Expiration Date:   01/29/2024   All questions were answered. The patient knows to call the clinic with any problems, questions or concerns.  I have spent a total of 30 minutes minutes of face-to-face and non-face-to-face time, preparing to see the patient,  performing a medically appropriate examination, counseling and educating the patient, ordering medications/tests/procedures,documenting clinical information in the electronic health record, and care coordination.   Ulysees Barns, MD Department of Hematology/Oncology [facility] Phone: [phone] Pager: [phone] Email: [email or web address]

## 2023-01-29 ENCOUNTER — Inpatient Hospital Stay: Payer: Medicare Other

## 2023-01-29 ENCOUNTER — Other Ambulatory Visit: Payer: Self-pay

## 2023-01-29 ENCOUNTER — Inpatient Hospital Stay: Payer: Medicare Other | Admitting: Hematology and Oncology

## 2023-01-29 VITALS — BP 120/71 | HR 74 | Temp 98.4°F | Resp 18 | Wt 132.4 lb

## 2023-01-29 VITALS — BP 124/66 | HR 73 | Resp 16

## 2023-01-29 DIAGNOSIS — R21 Rash and other nonspecific skin eruption: Secondary | ICD-10-CM

## 2023-01-29 DIAGNOSIS — C9000 Multiple myeloma not having achieved remission: Secondary | ICD-10-CM

## 2023-01-29 DIAGNOSIS — Z79899 Other long term (current) drug therapy: Secondary | ICD-10-CM | POA: Diagnosis not present

## 2023-01-29 DIAGNOSIS — Z5112 Encounter for antineoplastic immunotherapy: Secondary | ICD-10-CM | POA: Diagnosis not present

## 2023-01-29 DIAGNOSIS — E785 Hyperlipidemia, unspecified: Secondary | ICD-10-CM | POA: Diagnosis not present

## 2023-01-29 LAB — CBC WITH DIFFERENTIAL (CANCER CENTER ONLY)
Abs Immature Granulocytes: 0.06 10*3/uL (ref 0.00–0.07)
Basophils Absolute: 0.1 10*3/uL (ref 0.0–0.1)
Basophils Relative: 1 %
Eosinophils Absolute: 0.1 10*3/uL (ref 0.0–0.5)
Eosinophils Relative: 2 %
HCT: 39 % (ref 36.0–46.0)
Hemoglobin: 12.7 g/dL (ref 12.0–15.0)
Immature Granulocytes: 1 %
Lymphocytes Relative: 21 %
Lymphs Abs: 1.9 10*3/uL (ref 0.7–4.0)
MCH: 33 pg (ref 26.0–34.0)
MCHC: 32.6 g/dL (ref 30.0–36.0)
MCV: 101.3 fL — ABNORMAL HIGH (ref 80.0–100.0)
Monocytes Absolute: 0.7 10*3/uL (ref 0.1–1.0)
Monocytes Relative: 7 %
Neutro Abs: 6.2 10*3/uL (ref 1.7–7.7)
Neutrophils Relative %: 68 %
Platelet Count: 522 10*3/uL — ABNORMAL HIGH (ref 150–400)
RBC: 3.85 MIL/uL — ABNORMAL LOW (ref 3.87–5.11)
RDW: 15.5 % (ref 11.5–15.5)
WBC Count: 9 10*3/uL (ref 4.0–10.5)
nRBC: 0 % (ref 0.0–0.2)

## 2023-01-29 LAB — CMP (CANCER CENTER ONLY)
ALT: 24 U/L (ref 0–44)
AST: 22 U/L (ref 15–41)
Albumin: 3.5 g/dL (ref 3.5–5.0)
Alkaline Phosphatase: 63 U/L (ref 38–126)
Anion gap: 7 (ref 5–15)
BUN: 11 mg/dL (ref 8–23)
CO2: 28 mmol/L (ref 22–32)
Calcium: 8.6 mg/dL — ABNORMAL LOW (ref 8.9–10.3)
Chloride: 107 mmol/L (ref 98–111)
Creatinine: 0.67 mg/dL (ref 0.44–1.00)
GFR, Estimated: >60 mL/min (ref >60)
Glucose, Bld: 104 mg/dL — ABNORMAL HIGH (ref 70–99)
Potassium: 4 mmol/L (ref 3.5–5.1)
Sodium: 142 mmol/L (ref 135–145)
Total Bilirubin: 1 mg/dL (ref 0.3–1.2)
Total Protein: 5.7 g/dL — ABNORMAL LOW (ref 6.5–8.1)

## 2023-01-29 MED ORDER — DEXTROSE 5 % IV SOLN
70.0000 mg/m2 | Freq: Once | INTRAVENOUS | Status: AC
  Administered 2023-01-29: 120 mg via INTRAVENOUS
  Filled 2023-01-29: qty 60

## 2023-01-29 MED ORDER — SODIUM CHLORIDE 0.9 % IV SOLN: Freq: Once | INTRAVENOUS | Status: AC

## 2023-01-29 MED ORDER — SODIUM CHLORIDE 0.9 % IV SOLN
40.0000 mg | Freq: Once | INTRAVENOUS | Status: AC
  Administered 2023-01-29: 40 mg via INTRAVENOUS
  Filled 2023-01-29: qty 4

## 2023-01-29 NOTE — Patient Instructions (Signed)
[facility]   Discharge Instructions: Thank you for choosing [facility] to provide your oncology and hematology care.   If you have a lab appointment with the Cancer Center, please go directly to the Cancer Center and check in at the registration area.   Wear comfortable clothing and clothing appropriate for easy access to any Portacath or PICC line.   We strive to give you quality time with your provider. You may need to reschedule your appointment if you arrive late (15 or more minutes).  Arriving late affects you and other patients whose appointments are after yours.  Also, if you miss three or more appointments without notifying the office, you may be dismissed from the clinic at the provider's discretion.      For prescription refill requests, have your pharmacy contact our office and allow 72 hours for refills to be completed.    Today you received the following chemotherapy and/or immunotherapy agents: Carfilzomib (Kyprolis)       To help prevent nausea and vomiting after your treatment, we encourage you to take your nausea medication as directed.  BELOW ARE SYMPTOMS THAT SHOULD BE REPORTED IMMEDIATELY: *FEVER GREATER THAN 100.4 F (38 C) OR HIGHER *CHILLS OR SWEATING *NAUSEA AND VOMITING THAT IS NOT CONTROLLED WITH YOUR NAUSEA MEDICATION *UNUSUAL SHORTNESS OF BREATH *UNUSUAL BRUISING OR BLEEDING *URINARY PROBLEMS (pain or burning when urinating, or frequent urination) *BOWEL PROBLEMS (unusual diarrhea, constipation, pain near the anus) TENDERNESS IN MOUTH AND THROAT WITH OR WITHOUT PRESENCE OF ULCERS (sore throat, sores in mouth, or a toothache) UNUSUAL RASH, SWELLING OR PAIN  UNUSUAL VAGINAL DISCHARGE OR ITCHING   Items with * indicate a potential emergency and should be followed up as soon as possible or go to the Emergency Department if any problems should occur.  Please show the CHEMOTHERAPY ALERT CARD or IMMUNOTHERAPY  ALERT CARD at check-in to the Emergency Department and triage nurse.  Should you have questions after your visit or need to cancel or reschedule your appointment, please contact [facility]  Dept: [phone]  and follow the prompts.  Office hours are 8:00 a.m. to 4:30 p.m. Monday - Friday. Please note that voicemails left after 4:00 p.m. may not be returned until the following business day.  We are closed weekends and major holidays. You have access to a nurse at all times for urgent questions. Please call the main number to the clinic Dept: [phone] and follow the prompts.   For any non-urgent questions, you may also contact your provider using MyChart. We now offer e-Visits for anyone 18 and older to request care online for non-urgent symptoms. For details visit [email or web address].   Also download the MyChart app! Go to the app store, search "MyChart", open the app, select , and log in with your MyChart username and password. 

## 2023-01-30 ENCOUNTER — Encounter: Payer: Self-pay | Admitting: Hematology and Oncology

## 2023-01-30 ENCOUNTER — Other Ambulatory Visit: Payer: Self-pay

## 2023-01-30 ENCOUNTER — Other Ambulatory Visit (HOSPITAL_COMMUNITY): Payer: Self-pay

## 2023-01-30 DIAGNOSIS — R0602 Shortness of breath: Secondary | ICD-10-CM | POA: Diagnosis not present

## 2023-01-31 NOTE — Discharge Summary (Signed)
Physician Discharge Summary  Nicole Keith HKV:[id number] DOB: 1941-02-22 DOA: 01/16/2023  PCP: Farris Has, MD  Admit date: 01/16/2023 Discharge date: 01/20/23  Time spent: 26 minutes  Recommendations for Outpatient Follow-up:  Get screening chest x-ray in 1 week to 3 weeks Needs Chem-12 CBC in about 1 week at oncology/PCP office   Discharge Diagnoses:  MAIN problem for hospitalization   Sepsis secondary to pneumonia NSTEMI IgG lambda AKI on admission Collagenous colitis history Acquired thrombocytopenia from chemotherapy Impaired glucose tolerance  Please see below for itemized issues addressed in HOpsital- refer to other progress notes for clarity if needed  Discharge Condition: Improved  Diet recommendation: Heart healthy  Filed Weights   01/17/23 0008 01/17/23 0500 01/19/23 0437  Weight: 59.4 kg 59.4 kg 61.6 kg    History of present illness:  82 year old home dwelling white female History of collagenous colitis in 1990s, femoral neck fracture 2014, squamous cell CA, osteoporosis IgG lambda MM-Dr. Jonny Ruiz Dorsey-diagnosis 07/2018 failure Velcade/dexamethasone-second line therapy Revlimid subsequently Pomalyst/Ninlaro--progression of disease 11/06/2022 and discontinuation of Darzalex-currently on Krypolis/dexamethasone OV oncology 6/01/15/2023-suggested Zometa therapy 2/2 lytic lesions/pain   Present from home SOB status post chemo-O2 sat 80%-requiring 6L [place]-Tmax 102 Lactic acid 2.5, CO2 21 BUN/creatinine 35/1.7 (baseline 9/0.8) troponin 207-->180, BNP 191 Dimer elevated 3.3 WBC 18 hemoglobin 12 CXR opacity periphery lung bases = airspace disease?  Effusion   Rx ceftriaxone azithromycin NS 500 cc bolus in ED   2/2 ? troponin, echocardiogram ordered EKG my over read sinus rhythm PR interval 0.20 no ST-T wave changes-compared to EKG 6/3-no overt difference   6/8 Echo EF 55-60%, Mild Aov stenosis, 6/8 duplex lower extremities negative for DVT  Hospital Course:   Acute hypoxic respiratory failure on admission-2/2 PNA Sepsis on admission 2/2 PNA---Lactic acidosis--WBC improved--- no oxygen 6/9 Strp PNa neg, pending legionella Iv Abx--> Levaquin--and will complete 3 more days in the outpatient setting Tylenol for fever --- continue Mucinex 600 twice daily, DuoNebs every 4 as needed--chest physio as able   Elevated troponin 200 range, likely NSTEMI DDx infection DDx PE EKG shows no ST-T wave changes-echo -suspect NSTEMI as no overt chest pain on discussion with patient-GDMT with heparin and nitroglycerin were considered and this was not started because of NSTEMI and no real symptoms Hold BB--no WMA   IgG lambda myeloma on third line therapies-seen by Dr. Mayra Neer metastatic pain Appreciate oncology expertise-she tells me her paiin is better--prefers to defer Zometa to the OP setting D-dimer elevated-duplex lower extremities negative for DVT   Thrombocytopenia-new possibly related to chemo Baseline is about 170-261 Dropped to 121 but expect will recover if this is secondary to chemotherapy and suppression   AKI on admission + mild metabolic acidosis NSL--Acidosis resolved at time of discharge   Prior collagenous colitis--- Takes OTC Imodium and had some episodes of diarrhea 6/10 so we will resume Imodium 3 times daily as needed Do not suspect infectious as stool is semiformed   Prior hip fracture in 2014-stable and improved up out of bed   Impaired glucose tolerance A1c 5.9 would not aggressively control sugars needs to continue adequate calories given her moderate frailty and can reassess as an outpatient   Discharge Exam: Vitals:   01/20/23 0914 01/20/23 1405  BP: (!) 141/80 131/78  Pulse: 73 71  Resp: 14 16  Temp: 98.3 F (36.8 C) 97.6 F (36.4 C)  SpO2: 97% 97%    Subj on day of d/c   Coherent no distress  General Exam on discharge  EOMI NCAT no focal deficit Chest clear no wheeze rales rhonchi Abdomen soft no  rebound ROM intact  Discharge Instructions   Discharge Instructions     Diet - low sodium heart healthy   Complete by: As directed    Discharge instructions   Complete by: As directed    Finish the levaquin Get labs in 1 week Call Dr. Leonides Schanz for appt in 1 week and get labs   Increase activity slowly   Complete by: As directed       Allergies as of 01/20/2023       Reactions   Kyprolis [carfilzomib] Shortness Of Breath, Other (See Comments)   Has now started experiencing pneumonia-alike symptoms   Pomalyst [pomalidomide] Rash, Other (See Comments)   ENTIRE BODY BROKE OUT IN A RASH   Revlimid [lenalidomide] Rash, Other (See Comments)   WHOLE BODY BLISTERED   Betadine [povidone Iodine] Rash   Flagyl [metronidazole] Diarrhea   Fosamax [alendronate Sodium] Other (See Comments)   GI distress   Bactrim [sulfamethoxazole-trimethoprim] Nausea Only   Clindamycin/lincomycin    Pt can't remember reaction type.   Doxepin Hcl Other (See Comments)   Irritable, hallucinations   Hydrocodone-acetaminophen    Restlessness   Hydroxyzine Other (See Comments)   Hallucinations   Hydroxyzine Hcl Other (See Comments)   Hallucinations   Tramadol Hcl Other (See Comments)   Reaction not noted   Valium [diazepam] Other (See Comments)   Reaction not recalled   Actonel [risedronate Sodium] Other (See Comments)   GI distress   Boniva [ibandronic Acid] Other (See Comments)   GI distress   Doxycycline Rash   Penicillins Rash   Ancef was OK        Medication List     STOP taking these medications    ibuprofen 400 MG tablet Commonly known as: ADVIL       TAKE these medications    acetaminophen 500 MG tablet Commonly known as: TYLENOL Take 1,000 mg by mouth See admin instructions. Take 1,000 mg by mouth after breakfast, at 3:30 PM, and at 8:30 PM   acyclovir 400 MG tablet Commonly known as: ZOVIRAX Take 1 tablet (400 mg total) by mouth 2 (two) times daily.   famotidine 20 MG  tablet Commonly known as: PEPCID Take 1 tablet (20 mg total) by mouth daily.   famotidine-calcium carbonate-magnesium hydroxide 10-800-165 MG chewable tablet Commonly known as: PEPCID COMPLETE Chew 1 tablet by mouth daily as needed (for heartburn or indigestion).   guaiFENesin 600 MG 12 hr tablet Commonly known as: MUCINEX Take 1 tablet (600 mg total) by mouth 2 (two) times daily.   loperamide 2 MG capsule Commonly known as: IMODIUM Take 1 capsule (2 mg total) by mouth 3 (three) times daily between meals as needed for diarrhea or loose stools.   triamcinolone cream 0.1 % Commonly known as: KENALOG Apply 1 Application topically 2 (two) times daily. What changed:  when to take this reasons to take this   Vaseline Pure Ultra White ointment Generic drug: white petrolatum Apply 1 application  topically See admin instructions. Apply to the genitalia in the morning and at bedtime       ASK your doctor about these medications    levofloxacin 500 MG tablet Commonly known as: LEVAQUIN Take 1 tablet (500 mg total) by mouth daily for 3 days. Ask about: Should I take this medication?       Allergies  Allergen Reactions   Kyprolis [Carfilzomib] Shortness Of Breath and Other (  See Comments)    Has now started experiencing pneumonia-alike symptoms   Pomalyst [Pomalidomide] Rash and Other (See Comments)    ENTIRE BODY BROKE OUT IN A RASH   Revlimid [Lenalidomide] Rash and Other (See Comments)    WHOLE BODY BLISTERED   Betadine [Povidone Iodine] Rash   Flagyl [Metronidazole] Diarrhea   Fosamax [Alendronate Sodium] Other (See Comments)    GI distress   Bactrim [Sulfamethoxazole-Trimethoprim] Nausea Only   Clindamycin/Lincomycin     Pt can't remember reaction type.   Doxepin Hcl Other (See Comments)    Irritable, hallucinations   Hydrocodone-Acetaminophen     Restlessness   Hydroxyzine Other (See Comments)    Hallucinations   Hydroxyzine Hcl Other (See Comments)     Hallucinations    Tramadol Hcl Other (See Comments)    Reaction not noted   Valium [Diazepam] Other (See Comments)    Reaction not recalled   Actonel [Risedronate Sodium] Other (See Comments)    GI distress   Boniva [Ibandronic Acid] Other (See Comments)    GI distress   Doxycycline Rash   Penicillins Rash    Ancef was OK      The results of significant diagnostics from this hospitalization (including imaging, microbiology, ancillary and laboratory) are listed below for reference.    Significant Diagnostic Studies: VAS [place] LOWER EXTREMITY VENOUS (DVT)  Result Date: 01/18/2023  Lower Venous DVT Study Patient Name:  Nicole Keith  Date of Exam:   01/17/2023 Medical Rec #: [id number]         Accession #:    [id number] Date of Birth: 12/15/1940         Patient Gender: F Patient Age:   40 years Exam Location:  [facility] Procedure:      VAS [place] LOWER EXTREMITY VENOUS (DVT) Referring Phys: Rhetta Mura --------------------------------------------------------------------------------  Indications: Elevated D-Dimer.  Comparison Study: No previous study. Performing Technologist: McKayla Maag RVT, [place]  Examination Guidelines: A complete evaluation includes B-mode imaging, spectral Doppler, color Doppler, and power Doppler as needed of all accessible portions of each vessel. Bilateral testing is considered an integral part of a complete examination. Limited examinations for reoccurring indications may be performed as noted. The reflux portion of the exam is performed with the patient in reverse Trendelenburg.  +---------+---------------+---------+-----------+----------+--------------+ RIGHT    CompressibilityPhasicitySpontaneityPropertiesThrombus Aging +---------+---------------+---------+-----------+----------+--------------+ CFV      Full           Yes      Yes                                 +---------+---------------+---------+-----------+----------+--------------+ SFJ       Full                                                        +---------+---------------+---------+-----------+----------+--------------+ FV Prox  Full                                                        +---------+---------------+---------+-----------+----------+--------------+ FV Mid   Full                                                        +---------+---------------+---------+-----------+----------+--------------+  FV DistalFull                                                        +---------+---------------+---------+-----------+----------+--------------+ PFV      Full                                                        +---------+---------------+---------+-----------+----------+--------------+ POP      Full           Yes      Yes                                 +---------+---------------+---------+-----------+----------+--------------+ PTV      Full                                                        +---------+---------------+---------+-----------+----------+--------------+ PERO     Full                                                        +---------+---------------+---------+-----------+----------+--------------+   +---------+---------------+---------+-----------+----------+--------------+ LEFT     CompressibilityPhasicitySpontaneityPropertiesThrombus Aging +---------+---------------+---------+-----------+----------+--------------+ CFV      Full           Yes      Yes                                 +---------+---------------+---------+-----------+----------+--------------+ SFJ      Full                                                        +---------+---------------+---------+-----------+----------+--------------+ FV Prox  Full                                                        +---------+---------------+---------+-----------+----------+--------------+ FV Mid   Full                                                         +---------+---------------+---------+-----------+----------+--------------+ FV DistalFull                                                        +---------+---------------+---------+-----------+----------+--------------+  PFV      Full                                                        +---------+---------------+---------+-----------+----------+--------------+ POP      Full           Yes      Yes                                 +---------+---------------+---------+-----------+----------+--------------+ PTV      Full                                                        +---------+---------------+---------+-----------+----------+--------------+ PERO     Full                                                        +---------+---------------+---------+-----------+----------+--------------+     Summary: BILATERAL: - No evidence of deep vein thrombosis seen in the lower extremities, bilaterally. - No evidence of superficial venous thrombosis in the lower extremities, bilaterally. -No evidence of popliteal cyst, bilaterally.   *See table(s) above for measurements and observations. Electronically signed by Gerarda Fraction on 01/18/2023 at 3:56:16 PM.    Final    ECHOCARDIOGRAM COMPLETE  Result Date: 01/17/2023    ECHOCARDIOGRAM REPORT   Patient Name:   Nicole Keith Date of Exam: 01/17/2023 Medical Rec #:  [id number]        Height:       66.0 in Accession #:    [id number]       Weight:       131.0 lb Date of Birth:  Jun 06, 1941        BSA:          1.670 m Patient Age:    81 years         BP:           109/62 mmHg Patient Gender: F                HR:           76 bpm. Exam Location:  Inpatient Procedure: 2D Echo, Color Doppler and Cardiac Doppler Indications:    R06.9 DOE  History:        Patient has prior history of Echocardiogram examinations, most                 recent 01/10/2013. Risk Factors:Dyslipidemia.  Sonographer:    Irving Burton Senior RDCS Referring Phys: [id number]  SARA-MAIZ A THOMAS  Sonographer Comments: Poor parasternal window due to thin body habitus. IMPRESSIONS  1. Left ventricular ejection fraction, by estimation, is 55 to 60%. The left ventricle has normal function. Left ventricular endocardial border not optimally defined to evaluate regional wall motion. Left ventricular diastolic parameters are consistent with Grade I diastolic dysfunction (impaired relaxation).  2. Right ventricular systolic function is normal. The right ventricular size is normal. There is normal pulmonary  artery systolic pressure. The estimated right ventricular systolic pressure is 35.8 mmHg.  3. The mitral valve is degenerative. Trivial mitral valve regurgitation.  4. The aortic valve was not well visualized. There is moderate calcification of the aortic valve. Aortic valve regurgitation is not visualized. Mild aortic valve stenosis. Aortic valve mean gradient measures 10.0 mmHg. Dimentionless index 0.45.  5. The inferior vena cava is dilated in size with <50% respiratory variability, suggesting right atrial pressure of 15 mmHg. Comparison(s): Prior images unable to be directly viewed. FINDINGS  Left Ventricle: Left ventricular ejection fraction, by estimation, is 55 to 60%. The left ventricle has normal function. Left ventricular endocardial border not optimally defined to evaluate regional wall motion. The left ventricular internal cavity size was normal in size. Suboptimal image quality limits for assessment of left ventricular hypertrophy. Left ventricular diastolic parameters are consistent with Grade I diastolic dysfunction (impaired relaxation). Right Ventricle: The right ventricular size is normal. No increase in right ventricular wall thickness. Right ventricular systolic function is normal. There is normal pulmonary artery systolic pressure. The tricuspid regurgitant velocity is 2.28 m/s, and  with an assumed right atrial pressure of 15 mmHg, the estimated right ventricular systolic  pressure is 35.8 mmHg. Left Atrium: Left atrial size was normal in size. Right Atrium: Right atrial size was normal in size. Pericardium: There is no evidence of pericardial effusion. Presence of epicardial fat layer. Mitral Valve: The mitral valve is degenerative in appearance. There is mild thickening of the mitral valve leaflet(s). Mild mitral annular calcification. Trivial mitral valve regurgitation. Tricuspid Valve: The tricuspid valve is grossly normal. Tricuspid valve regurgitation is mild. Aortic Valve: The aortic valve was not well visualized. There is moderate calcification of the aortic valve. There is mild to moderate aortic valve annular calcification. Aortic valve regurgitation is not visualized. Mild aortic stenosis is present. Aortic valve mean gradient measures 10.0 mmHg. Aortic valve peak gradient measures 17.3 mmHg. Aortic valve area, by VTI measures 1.27 cm. Pulmonic Valve: The pulmonic valve was not well visualized. Pulmonic valve regurgitation is not visualized. Aorta: The aortic root was not well visualized and the ascending aorta was not well visualized. Venous: The inferior vena cava is dilated in size with less than 50% respiratory variability, suggesting right atrial pressure of 15 mmHg. IAS/Shunts: No atrial level shunt detected by color flow Doppler.  LEFT VENTRICLE PLAX 2D LVOT diam:     1.90 cm   Diastology LV SV:         51        LV e' medial:    6.85 cm/s LV SV Index:   31        LV E/e' medial:  8.4 LVOT Area:     2.84 cm  LV e' lateral:   9.03 cm/s                          LV E/e' lateral: 6.4  RIGHT VENTRICLE RV S prime:     12.60 cm/s TAPSE (M-mode): 1.8 cm LEFT ATRIUM             Index        RIGHT ATRIUM          Index LA Vol (A2C):   35.8 ml 21.43 ml/m  RA Area:     9.45 cm LA Vol (A4C):   45.5 ml 27.24 ml/m  RA Volume:   17.00 ml 10.18 ml/m LA Biplane Vol: 41.0 ml 24.54 ml/m  AORTIC VALVE AV Area (Vmax):    1.20 cm AV Area (Vmean):   1.28 cm AV Area (VTI):     1.27  cm AV Vmax:           208.00 cm/s AV Vmean:          156.000 cm/s AV VTI:            0.405 m AV Peak Grad:      17.3 mmHg AV Mean Grad:      10.0 mmHg LVOT Vmax:         88.10 cm/s LVOT Vmean:        70.600 cm/s LVOT VTI:          0.181 m LVOT/AV VTI ratio: 0.45 MITRAL VALVE               TRICUSPID VALVE MV Area (PHT): 2.79 cm    TR Peak grad:   20.8 mmHg MV Decel Time: 272 msec    TR Vmax:        228.00 cm/s MV E velocity: 57.40 cm/s MV A velocity: 72.80 cm/s  SHUNTS MV E/A ratio:  0.79        Systemic VTI:  0.18 m                            Systemic Diam: 1.90 cm Nona Dell MD Electronically signed by Nona Dell MD Signature Date/Time: 01/17/2023/1:04:00 PM    Final    CT CHEST WO CONTRAST  Result Date: 01/16/2023 CLINICAL DATA:  Dyspnea, multiple myeloma, spinal fracture EXAM: CT CHEST WITHOUT CONTRAST TECHNIQUE: Multidetector CT imaging of the chest was performed following the standard protocol without IV contrast. RADIATION DOSE REDUCTION: This exam was performed according to the departmental dose-optimization program which includes automated exposure control, adjustment of the mA and/or kV according to patient size and/or use of iterative reconstruction technique. COMPARISON:  MRI 07/24/2018, skeletal survey 11/03/2022 FINDINGS: Cardiovascular: Moderate multi-vessel coronary artery calcification. Global cardiac size within normal limits. Trace pericardial effusion. Central pulmonary arteries are of normal caliber. Moderate atherosclerotic calcification within the thoracic aorta. No aortic aneurysm. Mediastinum/Nodes: Visualized thyroid is unremarkable. No pathologic thoracic adenopathy. Esophagus unremarkable. Lungs/Pleura: Small partially loculated posterobasal pleural effusions are present. There is bilateral posterior basal lower lobe consolidation with endobronchial debris which may reflect changes of superimposed multifocal infection or aspiration. Pulmonary hyperinflation noted in keeping  with probable air trapping in the setting of COPD. Lobulated subpleural pulmonary nodule within the right upper lobe measures 10 mm at axial image # 44/5, indeterminate. Additional indeterminate nodular density at the left apex, axial image # 33/5 measures 9 mm in mean diameter. No pneumothorax. No central obstructing lesion. Upper Abdomen: No acute abnormality. Musculoskeletal: Expansile lytic mass within the anterior right seventh rib demonstrates significant soft tissue component measuring 4.2 x 5.1 cm at axial image # 52/3. There is expansile changes involving the anterior left seventh and sixth ribs likely related to changes related to the patient's underlying myeloma. Similarly, lytic lesions have developed within the thoracic spine, most notably at T7. There is diffuse osteopenia of the thoracic spine. Progressive loss of height of the T8 and T9 vertebral bodies with accentuated thoracic kyphosis. Remote healed sternal manubrial fracture. Remote L1 superior and inferior endplate fractures. No acute bone abnormality. IMPRESSION: 1. Bilateral posterior basal lower lobe consolidation with endobronchial debris which may reflect changes of superimposed multifocal infection or aspiration. 2. Small partially loculated posterobasal  pleural effusions. 3. Moderate multi-vessel coronary artery calcification. 4. Pulmonary hyperinflation in keeping with probable air trapping in keeping with COPD. 5. Expansile lytic mass within the anterior right seventh rib with significant soft tissue component measuring 4.2 x 5.1 cm. There is also expansile changes involving the anterior left seventh and sixth ribs. Similarly, lytic lesions have developed within the thoracic spine, most notably at T7. Findings are in keeping with the patient's underlying myeloma. 6. Progressive loss of height of the T8 and T9 vertebral bodies with accentuated thoracic kyphosis. 7. Remote healed sternal manubrial fracture. Remote L1 superior and inferior  endplate fractures. 8. Lobulated subpleural pulmonary nodule within the right upper lobe measures 10 mm at axial image # 44/5, indeterminate. Additional indeterminate nodular density at the left apex, axial image # 33/5 measures 9 mm in mean diameter. Consider one of the following in 3 months for both low-risk and high-risk individuals: (a) repeat chest CT, (b) follow-up PET-CT, or (c) tissue sampling. This recommendation follows the consensus statement: Guidelines for Management of Incidental Pulmonary Nodules Detected on CT Images: From the Fleischner Society 2017; Radiology 2017; 284:228-243. Aortic Atherosclerosis (ICD10-I70.0). Electronically Signed   By: Helyn Numbers M.D.   On: 01/16/2023 22:59   DG Chest Port 1 View  Result Date: 01/16/2023 CLINICAL DATA:  Cough and shortness of breath. EXAM: PORTABLE CHEST 1 VIEW COMPARISON:  Chest radiograph 07/16/2017, chest from bone survey 11/03/2022 FINDINGS: Chronic hyperinflation. The heart is upper normal in size. Stable mediastinal contours. Increasing opacity at the periphery of the lung bases may represent ill-defined airspace disease and small effusions. No pulmonary edema. No pneumothorax. Patient's known bone lesions are not well characterized on the current exam. IMPRESSION: 1. Increasing opacity at the periphery of the lung bases may represent ill-defined airspace disease and small effusions. 2. Chronic hyperinflation. Electronically Signed   By: Narda Rutherford M.D.   On: 01/16/2023 18:18    Microbiology: No results found for this or any previous visit (from the past 240 hour(s)).   Labs: Basic Metabolic Panel: Recent Labs  Lab 01/29/23 0958  NA 142  K 4.0  CL 107  CO2 28  GLUCOSE 104*  BUN 11  CREATININE 0.67  CALCIUM 8.6*   Liver Function Tests: Recent Labs  Lab 01/29/23 0958  AST 22  ALT 24  ALKPHOS 63  BILITOT 1.0  PROT 5.7*  ALBUMIN 3.5   No results for input(s): "LIPASE", "AMYLASE" in the last 168 hours. No results  for input(s): "AMMONIA" in the last 168 hours. CBC: Recent Labs  Lab 01/29/23 0958  WBC 9.0  NEUTROABS 6.2  HGB 12.7  HCT 39.0  MCV 101.3*  PLT 522*   Cardiac Enzymes: No results for input(s): "CKTOTAL", "CKMB", "CKMBINDEX", "TROPONINI" in the last 168 hours. BNP: BNP (last 3 results) Recent Labs    01/16/23 1830  BNP 191.5*    ProBNP (last 3 results) No results for input(s): "PROBNP" in the last 8760 hours.  CBG: No results for input(s): "GLUCAP" in the last 168 hours.     Signed:  Rhetta Mura MD   [facility] 01/31/2023, 5:46 PM

## 2023-02-10 ENCOUNTER — Ambulatory Visit: Payer: Medicare Other | Admitting: Physician Assistant

## 2023-02-10 ENCOUNTER — Other Ambulatory Visit (HOSPITAL_COMMUNITY): Payer: Self-pay

## 2023-02-10 ENCOUNTER — Other Ambulatory Visit: Payer: Medicare Other

## 2023-02-11 ENCOUNTER — Other Ambulatory Visit: Payer: Self-pay

## 2023-02-11 ENCOUNTER — Other Ambulatory Visit (HOSPITAL_COMMUNITY): Payer: Self-pay

## 2023-02-11 ENCOUNTER — Inpatient Hospital Stay: Payer: Medicare Other | Admitting: Physician Assistant

## 2023-02-11 ENCOUNTER — Inpatient Hospital Stay: Payer: Medicare Other

## 2023-02-11 ENCOUNTER — Inpatient Hospital Stay: Payer: Medicare Other | Attending: Physician Assistant

## 2023-02-11 ENCOUNTER — Ambulatory Visit: Payer: Medicare Other

## 2023-02-11 VITALS — BP 124/59 | HR 71 | Resp 15

## 2023-02-11 DIAGNOSIS — C9000 Multiple myeloma not having achieved remission: Secondary | ICD-10-CM | POA: Diagnosis not present

## 2023-02-11 DIAGNOSIS — Z5112 Encounter for antineoplastic immunotherapy: Secondary | ICD-10-CM | POA: Diagnosis not present

## 2023-02-11 DIAGNOSIS — Z79899 Other long term (current) drug therapy: Secondary | ICD-10-CM | POA: Insufficient documentation

## 2023-02-11 LAB — CBC WITH DIFFERENTIAL (CANCER CENTER ONLY)
Abs Immature Granulocytes: 0.05 10*3/uL (ref 0.00–0.07)
Basophils Absolute: 0.1 10*3/uL (ref 0.0–0.1)
Basophils Relative: 1 %
Eosinophils Absolute: 0.2 10*3/uL (ref 0.0–0.5)
Eosinophils Relative: 3 %
HCT: 39.1 % (ref 36.0–46.0)
Hemoglobin: 12.7 g/dL (ref 12.0–15.0)
Immature Granulocytes: 1 %
Lymphocytes Relative: 25 %
Lymphs Abs: 1.5 10*3/uL (ref 0.7–4.0)
MCH: 34.2 pg — ABNORMAL HIGH (ref 26.0–34.0)
MCHC: 32.5 g/dL (ref 30.0–36.0)
MCV: 105.4 fL — ABNORMAL HIGH (ref 80.0–100.0)
Monocytes Absolute: 0.5 10*3/uL (ref 0.1–1.0)
Monocytes Relative: 9 %
Neutro Abs: 3.8 10*3/uL (ref 1.7–7.7)
Neutrophils Relative %: 61 %
Platelet Count: 412 10*3/uL — ABNORMAL HIGH (ref 150–400)
RBC: 3.71 MIL/uL — ABNORMAL LOW (ref 3.87–5.11)
RDW: 14.3 % (ref 11.5–15.5)
WBC Count: 6.1 10*3/uL (ref 4.0–10.5)
nRBC: 0 % (ref 0.0–0.2)

## 2023-02-11 LAB — CMP (CANCER CENTER ONLY)
ALT: 23 U/L (ref 0–44)
AST: 25 U/L (ref 15–41)
Albumin: 3.5 g/dL (ref 3.5–5.0)
Alkaline Phosphatase: 65 U/L (ref 38–126)
Anion gap: 7 (ref 5–15)
BUN: 9 mg/dL (ref 8–23)
CO2: 28 mmol/L (ref 22–32)
Calcium: 8.8 mg/dL — ABNORMAL LOW (ref 8.9–10.3)
Chloride: 106 mmol/L (ref 98–111)
Creatinine: 0.63 mg/dL (ref 0.44–1.00)
GFR, Estimated: >60 mL/min (ref >60)
Glucose, Bld: 90 mg/dL (ref 70–99)
Potassium: 4 mmol/L (ref 3.5–5.1)
Sodium: 141 mmol/L (ref 135–145)
Total Bilirubin: 1 mg/dL (ref 0.3–1.2)
Total Protein: 5.2 g/dL — ABNORMAL LOW (ref 6.5–8.1)

## 2023-02-11 MED ORDER — DEXTROSE 5 % IV SOLN
70.0000 mg/m2 | Freq: Once | INTRAVENOUS | Status: AC
  Administered 2023-02-11: 120 mg via INTRAVENOUS
  Filled 2023-02-11: qty 60

## 2023-02-11 MED ORDER — SODIUM CHLORIDE 0.9 % IV SOLN: Freq: Once | INTRAVENOUS | Status: AC

## 2023-02-11 MED ORDER — SODIUM CHLORIDE 0.9 % IV SOLN
40.0000 mg | Freq: Once | INTRAVENOUS | Status: AC
  Administered 2023-02-11: 40 mg via INTRAVENOUS
  Filled 2023-02-11: qty 4

## 2023-02-11 NOTE — Progress Notes (Addendum)
[facility] Telephone:(336) [phone]   Fax:([phone]) [phone]  PROGRESS NOTE  Patient Care Team: Farris Has, MD as PCP - General (Family Medicine)  CHIEF COMPLAINTS/PURPOSE OF CONSULTATION:  IgG Lambda Multiple Myeloma  ONCOLOGIC HISTORY: 07/27/2018-09/2018: Received weekly velcade plus dexamethasone. Therapy changed due to poor response.  09/2018: Started Revlimid 25 mg for 21 days on, 7 days off of a 28 day cycle with dexamethasone. Therapy was held after 2 cycles because of severe dermatological toxicity 12/2018: Pomalyst 1 mg daily for 21 days.  She completed 4 cycles of therapy and therapy was held due to occurrence of dermatological toxicity.  07/2019: Started Ninlaro 4 mg weekly with dexamethasone 20 mg weekly for 3 weeks on and 1 week off. Daratumumab was addd in April 2021. Clearnce Sorrel was discontinued in November 2021. 05/18/2020: Started monthly daratumumab and transitioned to q 3 month schedule in April 2022.  11/06/2022: Darzalex discontinued due to progression of disease. M protein 0.9 with worsening lytic lesions in the skeleton.  11/20/2022: Cycle 1 Day 1 of Kyprolis/Dex therapy.  12/19/2022: Cycle 2 Day 1 of Kyprolis/Dex therapy 01/15/2023: Cycle 3 Day 1 of Kyprolis/Dex therapy  HISTORY OF PRESENTING ILLNESS:  Nicole Keith 82 y.o. female returns for a follow up for IgG lambda multiple myeloma. She was last seen on 01/29/2023. She presents today for Cycle 3, Day 15 of Kyprolis/Dex.   On exam today, Ms. Olds reports she is doing well and continues to tolerate treatment without any new or concerning symptoms. She reports that since completing antibiotic therapy on 6/28, she denies any residual cough or signs of infection. She is otherwise feeling well and reports her appetite and energy levels are stable. She reports her rash involving her upper extremities and chest are stable and well controlled with steroid cream.  Overall she is willing and able to proceed with  treatment today. She denies fevers, chills, sweats, shortness of breath, chest pain or cough. She has no other complaints. Rest of the 10 point ROS is below.  MEDICAL HISTORY:  Past Medical History:  Diagnosis Date   Allergic rhinitis    Arthritis    Colitis, collagenous    Diverticular disease    severe sig tics/fixation 2012   Family hx of colon cancer    Fracture of femoral neck, right (HCC) 01/09/2013   GERD (gastroesophageal reflux disease)    exacerbation of reflux-like symptoms 04/2011   Hiatal hernia    Hyperlipidemia    muliplt myelom 2020   Osteoarthritis of right hip 01/10/2013    SURGICAL HISTORY: Past Surgical History:  Procedure Laterality Date   CHOLECYSTECTOMY     EYE SURGERY  feb 2016   cataract right eye   LEG SURGERY Left    TONSILLECTOMY     TOTAL HIP ARTHROPLASTY Right 01/10/2013   Procedure: TOTAL HIP ARTHROPLASTY;  Surgeon: Eulas Post, MD;  Location: [facility] OR;  Service: Orthopedics;  Laterality: Right;    SOCIAL HISTORY: Social History   Socioeconomic History   Marital status: Married    Spouse name: Not on file   Number of children: 0   Years of education: Not on file   Highest education level: Some college, no degree  Occupational History   Occupation: retired  Tobacco Use   Smoking status: Former    Types: Cigarettes    Quit date: 01/10/1972    Years since quitting: 51.1   Smokeless tobacco: Never   Tobacco comments:    quit 1973  Vaping Use  Vaping Use: Never used  Substance and Sexual Activity   Alcohol use: No   Drug use: No   Sexual activity: Not on file  Other Topics Concern   Not on file  Social History Narrative   Married, no children. Drinks 2 cups decaf coffee a day. Walks daily. Is active, plays golf, she and her husband go hiking frequently. 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   Social Determinants of Health   Financial Resource Strain: Not on file  Food Insecurity: No Food Insecurity (01/17/2023)    Hunger Vital Sign    Worried About Running Out of Food in the Last Year: Never true    Ran Out of Food in the Last Year: Never true  Transportation Needs: No Transportation Needs (01/17/2023)   PRAPARE - Administrator, Civil Service (Medical): No    Lack of Transportation (Non-Medical): No  Physical Activity: Not on file  Stress: Not on file  Social Connections: Not on file  Intimate Partner Violence: Not At Risk (01/17/2023)   Humiliation, Afraid, Rape, and Kick questionnaire    Fear of Current or Ex-Partner: No    Emotionally Abused: No    Physically Abused: No    Sexually Abused: No    FAMILY HISTORY: Family History  Problem Relation Age of Onset   Congestive Heart Failure Mother    Arthritis-Osteo Mother    Colon cancer Mother    Heart attack Father    Rheum arthritis Father    Colon cancer Maternal Aunt    Colon cancer Maternal Aunt    Hypothyroidism Sister     ALLERGIES:  is allergic to kyprolis [carfilzomib], pomalyst [pomalidomide], revlimid [lenalidomide], betadine [povidone iodine], flagyl [metronidazole], fosamax [alendronate sodium], bactrim [sulfamethoxazole-trimethoprim], clindamycin/lincomycin, doxepin hcl, hydrocodone-acetaminophen, hydroxyzine, hydroxyzine hcl, tramadol hcl, valium [diazepam], actonel [risedronate sodium], boniva [ibandronic acid], doxycycline, and penicillins.  MEDICATIONS:  Current Outpatient Medications  Medication Sig Dispense Refill   acetaminophen (TYLENOL) 500 MG tablet Take 1,000 mg by mouth See admin instructions. Take 1,000 mg by mouth after breakfast, at 3:30 PM, and at 8:30 PM     albuterol (VENTOLIN HFA) 108 (90 Base) MCG/ACT inhaler SMARTSIG:1 Puff(s) By Mouth Every 4-6 Hours PRN     famotidine (PEPCID) 20 MG tablet Take 1 tablet (20 mg total) by mouth daily. 30 tablet 0   famotidine-calcium carbonate-magnesium hydroxide (PEPCID COMPLETE) 10-800-165 MG chewable tablet Chew 1 tablet by mouth daily as needed (for heartburn  or indigestion).      guaiFENesin (MUCINEX) 600 MG 12 hr tablet Take 1 tablet (600 mg total) by mouth 2 (two) times daily. 40 tablet 0   loperamide (IMODIUM) 2 MG capsule Take 1 capsule (2 mg total) by mouth 3 (three) times daily between meals as needed for diarrhea or loose stools. 30 capsule 1   triamcinolone cream (KENALOG) 0.1 % Apply 1 Application topically 2 (two) times daily. (Patient taking differently: Apply 1 Application topically 2 (two) times daily as needed (for allergic flares- affected areas).) 453.6 g 2   VASELINE PURE ULTRA WHITE ointment Apply 1 application  topically See admin instructions. Apply to the genitalia in the morning and at bedtime     acyclovir (ZOVIRAX) 400 MG tablet Take 1 tablet (400 mg total) by mouth 2 (two) times daily. (Patient not taking: Reported on 01/16/2023) 60 tablet 3   No current facility-administered medications for this visit.    REVIEW OF SYSTEMS:   Constitutional: ( - ) fevers, ( - )  chills , ( - ) night sweats Eyes: ( - ) blurriness of vision, ( - ) double vision, ( - ) watery eyes Ears, nose, mouth, throat, and face: ( - ) mucositis, ( - ) sore throat Respiratory: ( - ) cough, ( - ) dyspnea, ( - ) wheezes Cardiovascular: ( - ) palpitation, ( - ) chest discomfort, ( - ) lower extremity swelling Gastrointestinal:  ( - ) nausea, ( - ) heartburn, ( - ) change in bowel habits Skin: ( +) abnormal skin rashes Lymphatics: ( - ) new lymphadenopathy, ( - ) easy bruising Neurological: ( - ) numbness, ( - ) tingling, ( - ) new weaknesses Behavioral/Psych: ( - ) mood change, ( - ) new changes  All other systems were reviewed with the patient and are negative.  PHYSICAL EXAMINATION: ECOG PERFORMANCE STATUS: 1 - Symptomatic but completely ambulatory  Vitals:   02/11/23 0830  BP: 137/69  Pulse: 63  Resp: 13  Temp: (!) 97.5 F (36.4 C)  SpO2: 99%     Filed Weights   02/11/23 0830  Weight: 128 lb 8 oz (58.3 kg)   GENERAL: well appearing female  in NAD  SKIN: skin color, texture, turgor are normal. Diffuse, flat erythematous rash.  EYES: conjunctiva are pink and non-injected, sclera clear LYMPH:  no palpable lymphadenopathy in the cervical or supraclavicular lymph nodes.  LUNGS: clear to auscultation and percussion with normal breathing effort HEART: regular rate & rhythm and no murmurs and no lower extremity edema Musculoskeletal: no cyanosis of digits and no clubbing  PSYCH: alert & oriented x 3, fluent speech NEURO: no focal motor/sensory deficits  LABORATORY DATA:  I have reviewed the data as listed    Latest Ref Rng & Units 02/11/2023    7:26 AM 01/29/2023    9:58 AM 01/20/2023    5:34 AM  CBC  WBC 4.0 - 10.5 K/uL 6.1  9.0  6.0   Hemoglobin 12.0 - 15.0 g/dL 16.1  09.6  04.5   Hematocrit 36.0 - 46.0 % 39.1  39.0  33.4   Platelets 150 - 400 K/uL 412  522  121        Latest Ref Rng & Units 01/29/2023    9:58 AM 01/20/2023    5:34 AM 01/19/2023    3:06 AM  CMP  Glucose 70 - 99 mg/dL 409  811  914   BUN 8 - 23 mg/dL 11  13  21    Creatinine 0.44 - 1.00 mg/dL 7.82  9.56  2.13   Sodium 135 - 145 mmol/L 142  138  137   Potassium 3.5 - 5.1 mmol/L 4.0  3.8  3.5   Chloride 98 - 111 mmol/L 107  107  107   CO2 22 - 32 mmol/L 28  25  22    Calcium 8.9 - 10.3 mg/dL 8.6  7.9  7.9   Total Protein 6.5 - 8.1 g/dL 5.7  4.7  4.7   Total Bilirubin 0.3 - 1.2 mg/dL 1.0  1.1  0.7   Alkaline Phos 38 - 126 U/L 63  47  40   AST 15 - 41 U/L 22  33  25   ALT 0 - 44 U/L 24  32  24     ASSESSMENT & PLAN INARI REHAK is a 82 y.o.female who presents to the clinic for follow up for multiple myeloma.  #IgG Lambda Multiple Myeloma: --Initially presented in April 2016 with M spike of 3.2 g/dL and  IgG level of 4685. Underwent bone marrow biopsy from 11/20/2014 show 29% plasma cells. 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  --In December 2019 with rising M spike of 5.9 g/dL and worsening anemia, with Hgb 8.7, she met criteria for  transformation of active multiple myeloma.  --From 07/27/2018-09/2018, received weekly velcade plus dexamethasone. Therapy changed due to poor response.  --In 09/2018, started Revlimid 25 mg for 21 days on, 7 days off of a 28 day cycle with dexamethasone. Therapy was held after 2 cycles because of severe dermatological toxicity --In 12/2018, switched to Pomalyst 1 mg daily for 21 days.  She completed 4 cycles of therapy and therapy was held due to occurrence of dermatological toxicity.  --In 07/2019, switch to Ninlaro 4 mg weekly with dexamethasone 20 mg weekly for 3 weeks on and 1 week off. Daratumumab was addd in April 2021. Clearnce Sorrel was discontinued in November 2021. --On 05/18/2020, started monthly daratumumab and transitioned to q 3 month schedule in April 2022.  --On 11/20/2022, switch to Kyprolis/Dex due to rising M-protein and bone survey that showed worsening lytic lesions.  PLAN:   --Labs today show white blood cell count 6.1, hemoglobin 12.7, MCV 105.4, and platelets of 412, Creatinine and LFTs normal.  --Due Cycle 3 Day 15 of Kyprolis/Dex. Proceed without any dose modifications.  --Scheduled for repeat metastatic survey on 02/20/2023 --RTC in 2 weeks for Cycle 4 Day 8 of Kyprolis/Dex with continued weekly treatments.   #Diffuse erythematous rash-stable --Managed with kenalog cream. -- nystatin powder for groin area.  --Discussed referral to dermatology, patient deferred at this time.   #Supportive Care -- port not required but can be placed if requested.  -- zofran 8mg  q8H PRN and compazine 10mg  PO q6H for nausea -- acyclovir 400mg  PO BID for VCZ prophylaxis -- no pain medication required at this time.  -- discussed role for zometa therapy in the setting her lytic lesions. Explained zometa is given q 12 weeks and requires a dental clearance.  After discussing this with the patient further today she knows she would like to hold on this for the time being as she is afraid of the side  effects.  Strongly emphasized that she should reconsider as this will be helpful with her lytic bone lesions.  No orders of the defined types were placed in this encounter.  All questions were answered. The patient knows to call the clinic with any problems, questions or concerns.  I have spent a total of 30 minutes minutes of face-to-face and non-face-to-face time, preparing to see the patient,  performing a medically appropriate examination, counseling and educating the patient, ordering medications/tests/procedures,documenting clinical information in the electronic health record, and care coordination.   Georga Kaufmann PA-C Dept of Hematology and Oncology [facility] Phone: [phone]

## 2023-02-11 NOTE — Patient Instructions (Signed)
[facility]   Discharge Instructions: Thank you for choosing [facility] to provide your oncology and hematology care.   If you have a lab appointment with the Cancer Center, please go directly to the Cancer Center and check in at the registration area.   Wear comfortable clothing and clothing appropriate for easy access to any Portacath or PICC line.   We strive to give you quality time with your provider. You may need to reschedule your appointment if you arrive late (15 or more minutes).  Arriving late affects you and other patients whose appointments are after yours.  Also, if you miss three or more appointments without notifying the office, you may be dismissed from the clinic at the provider's discretion.      For prescription refill requests, have your pharmacy contact our office and allow 72 hours for refills to be completed.    Today you received the following chemotherapy and/or immunotherapy agents: Carfilzomib (Kyprolis)       To help prevent nausea and vomiting after your treatment, we encourage you to take your nausea medication as directed.  BELOW ARE SYMPTOMS THAT SHOULD BE REPORTED IMMEDIATELY: *FEVER GREATER THAN 100.4 F (38 C) OR HIGHER *CHILLS OR SWEATING *NAUSEA AND VOMITING THAT IS NOT CONTROLLED WITH YOUR NAUSEA MEDICATION *UNUSUAL SHORTNESS OF BREATH *UNUSUAL BRUISING OR BLEEDING *URINARY PROBLEMS (pain or burning when urinating, or frequent urination) *BOWEL PROBLEMS (unusual diarrhea, constipation, pain near the anus) TENDERNESS IN MOUTH AND THROAT WITH OR WITHOUT PRESENCE OF ULCERS (sore throat, sores in mouth, or a toothache) UNUSUAL RASH, SWELLING OR PAIN  UNUSUAL VAGINAL DISCHARGE OR ITCHING   Items with * indicate a potential emergency and should be followed up as soon as possible or go to the Emergency Department if any problems should occur.  Please show the CHEMOTHERAPY ALERT CARD or IMMUNOTHERAPY  ALERT CARD at check-in to the Emergency Department and triage nurse.  Should you have questions after your visit or need to cancel or reschedule your appointment, please contact [facility]  Dept: [phone]  and follow the prompts.  Office hours are 8:00 a.m. to 4:30 p.m. Monday - Friday. Please note that voicemails left after 4:00 p.m. may not be returned until the following business day.  We are closed weekends and major holidays. You have access to a nurse at all times for urgent questions. Please call the main number to the clinic Dept: [phone] and follow the prompts.   For any non-urgent questions, you may also contact your provider using MyChart. We now offer e-Visits for anyone 18 and older to request care online for non-urgent symptoms. For details visit [email or web address].   Also download the MyChart app! Go to the app store, search "MyChart", open the app, select [facility], and log in with your MyChart username and password. 

## 2023-02-12 ENCOUNTER — Encounter: Payer: Self-pay | Admitting: Hematology and Oncology

## 2023-02-13 ENCOUNTER — Other Ambulatory Visit (HOSPITAL_COMMUNITY): Payer: Self-pay

## 2023-02-13 LAB — KAPPA/LAMBDA LIGHT CHAINS
Kappa free light chain: 1.6 mg/L — ABNORMAL LOW (ref 3.3–19.4)
Kappa, lambda light chain ratio: 0.09 — ABNORMAL LOW (ref 0.26–1.65)
Lambda free light chains: 17 mg/L (ref 5.7–26.3)

## 2023-02-16 LAB — MULTIPLE MYELOMA PANEL, SERUM
Albumin SerPl Elph-Mcnc: 3.3 g/dL (ref 2.9–4.4)
Albumin/Glob SerPl: 1.5 (ref 0.7–1.7)
Alpha 1: 0.3 g/dL (ref 0.0–0.4)
Alpha2 Glob SerPl Elph-Mcnc: 0.5 g/dL (ref 0.4–1.0)
B-Globulin SerPl Elph-Mcnc: 0.7 g/dL (ref 0.7–1.3)
Gamma Glob SerPl Elph-Mcnc: 0.8 g/dL (ref 0.4–1.8)
Globulin, Total: 2.3 g/dL (ref 2.2–3.9)
IgA: 9 mg/dL — ABNORMAL LOW (ref 64–422)
IgG (Immunoglobin G), Serum: 768 mg/dL (ref 586–1602)
IgM (Immunoglobulin M), Srm: 11 mg/dL — ABNORMAL LOW (ref 26–217)
M Protein SerPl Elph-Mcnc: 0.7 g/dL — ABNORMAL HIGH
Total Protein ELP: 5.6 g/dL — ABNORMAL LOW (ref 6.0–8.5)

## 2023-02-19 ENCOUNTER — Other Ambulatory Visit: Payer: Medicare Other

## 2023-02-19 ENCOUNTER — Ambulatory Visit: Payer: Medicare Other

## 2023-02-19 ENCOUNTER — Inpatient Hospital Stay: Payer: Medicare Other

## 2023-02-19 ENCOUNTER — Other Ambulatory Visit: Payer: Self-pay

## 2023-02-19 ENCOUNTER — Encounter: Payer: Self-pay | Admitting: Hematology and Oncology

## 2023-02-19 VITALS — BP 148/67 | HR 64 | Temp 98.6°F | Resp 17 | Ht 66.0 in | Wt 129.8 lb

## 2023-02-19 DIAGNOSIS — C9000 Multiple myeloma not having achieved remission: Secondary | ICD-10-CM | POA: Diagnosis not present

## 2023-02-19 DIAGNOSIS — Z79899 Other long term (current) drug therapy: Secondary | ICD-10-CM | POA: Diagnosis not present

## 2023-02-19 DIAGNOSIS — D472 Monoclonal gammopathy: Secondary | ICD-10-CM

## 2023-02-19 DIAGNOSIS — Z5112 Encounter for antineoplastic immunotherapy: Secondary | ICD-10-CM | POA: Diagnosis not present

## 2023-02-19 LAB — CBC WITH DIFFERENTIAL (CANCER CENTER ONLY)
Abs Immature Granulocytes: 0.06 10*3/uL (ref 0.00–0.07)
Basophils Absolute: 0.1 10*3/uL (ref 0.0–0.1)
Basophils Relative: 1 %
Eosinophils Absolute: 0.3 10*3/uL (ref 0.0–0.5)
Eosinophils Relative: 3 %
HCT: 36.2 % (ref 36.0–46.0)
Hemoglobin: 12 g/dL (ref 12.0–15.0)
Immature Granulocytes: 1 %
Lymphocytes Relative: 26 %
Lymphs Abs: 2 10*3/uL (ref 0.7–4.0)
MCH: 35 pg — ABNORMAL HIGH (ref 26.0–34.0)
MCHC: 33.1 g/dL (ref 30.0–36.0)
MCV: 105.5 fL — ABNORMAL HIGH (ref 80.0–100.0)
Monocytes Absolute: 1 10*3/uL (ref 0.1–1.0)
Monocytes Relative: 13 %
Neutro Abs: 4.4 10*3/uL (ref 1.7–7.7)
Neutrophils Relative %: 56 %
Platelet Count: 376 10*3/uL (ref 150–400)
RBC: 3.43 MIL/uL — ABNORMAL LOW (ref 3.87–5.11)
RDW: 13.9 % (ref 11.5–15.5)
WBC Count: 7.9 10*3/uL (ref 4.0–10.5)
nRBC: 0 % (ref 0.0–0.2)

## 2023-02-19 LAB — CMP (CANCER CENTER ONLY)
ALT: 28 U/L (ref 0–44)
AST: 22 U/L (ref 15–41)
Albumin: 3.3 g/dL — ABNORMAL LOW (ref 3.5–5.0)
Alkaline Phosphatase: 113 U/L (ref 38–126)
Anion gap: 7 (ref 5–15)
BUN: 11 mg/dL (ref 8–23)
CO2: 26 mmol/L (ref 22–32)
Calcium: 8.7 mg/dL — ABNORMAL LOW (ref 8.9–10.3)
Chloride: 106 mmol/L (ref 98–111)
Creatinine: 0.61 mg/dL (ref 0.44–1.00)
GFR, Estimated: >60 mL/min (ref >60)
Glucose, Bld: 100 mg/dL — ABNORMAL HIGH (ref 70–99)
Potassium: 4 mmol/L (ref 3.5–5.1)
Sodium: 139 mmol/L (ref 135–145)
Total Bilirubin: 0.5 mg/dL (ref 0.3–1.2)
Total Protein: 5.5 g/dL — ABNORMAL LOW (ref 6.5–8.1)

## 2023-02-19 MED ORDER — SODIUM CHLORIDE 0.9 % IV SOLN: Freq: Once | INTRAVENOUS | Status: AC

## 2023-02-19 MED ORDER — DEXTROSE 5 % IV SOLN
70.0000 mg/m2 | Freq: Once | INTRAVENOUS | Status: AC
  Administered 2023-02-19: 120 mg via INTRAVENOUS
  Filled 2023-02-19: qty 60

## 2023-02-19 MED ORDER — SODIUM CHLORIDE 0.9 % IV SOLN
40.0000 mg | Freq: Once | INTRAVENOUS | Status: AC
  Administered 2023-02-19: 40 mg via INTRAVENOUS
  Filled 2023-02-19: qty 4

## 2023-02-19 NOTE — Patient Instructions (Signed)
[facility]   Discharge Instructions: Thank you for choosing [facility] to provide your oncology and hematology care.   If you have a lab appointment with the Cancer Center, please go directly to the Cancer Center and check in at the registration area.   Wear comfortable clothing and clothing appropriate for easy access to any Portacath or PICC line.   We strive to give you quality time with your provider. You may need to reschedule your appointment if you arrive late (15 or more minutes).  Arriving late affects you and other patients whose appointments are after yours.  Also, if you miss three or more appointments without notifying the office, you may be dismissed from the clinic at the provider's discretion.      For prescription refill requests, have your pharmacy contact our office and allow 72 hours for refills to be completed.    Today you received the following chemotherapy and/or immunotherapy agents: Carfilzomib (Kyprolis)       To help prevent nausea and vomiting after your treatment, we encourage you to take your nausea medication as directed.  BELOW ARE SYMPTOMS THAT SHOULD BE REPORTED IMMEDIATELY: *FEVER GREATER THAN 100.4 F (38 C) OR HIGHER *CHILLS OR SWEATING *NAUSEA AND VOMITING THAT IS NOT CONTROLLED WITH YOUR NAUSEA MEDICATION *UNUSUAL SHORTNESS OF BREATH *UNUSUAL BRUISING OR BLEEDING *URINARY PROBLEMS (pain or burning when urinating, or frequent urination) *BOWEL PROBLEMS (unusual diarrhea, constipation, pain near the anus) TENDERNESS IN MOUTH AND THROAT WITH OR WITHOUT PRESENCE OF ULCERS (sore throat, sores in mouth, or a toothache) UNUSUAL RASH, SWELLING OR PAIN  UNUSUAL VAGINAL DISCHARGE OR ITCHING   Items with * indicate a potential emergency and should be followed up as soon as possible or go to the Emergency Department if any problems should occur.  Please show the CHEMOTHERAPY ALERT CARD or IMMUNOTHERAPY  ALERT CARD at check-in to the Emergency Department and triage nurse.  Should you have questions after your visit or need to cancel or reschedule your appointment, please contact [facility]  Dept: [phone]  and follow the prompts.  Office hours are 8:00 a.m. to 4:30 p.m. Monday - Friday. Please note that voicemails left after 4:00 p.m. may not be returned until the following business day.  We are closed weekends and major holidays. You have access to a nurse at all times for urgent questions. Please call the main number to the clinic Dept: [phone] and follow the prompts.   For any non-urgent questions, you may also contact your provider using MyChart. We now offer e-Visits for anyone 18 and older to request care online for non-urgent symptoms. For details visit [email or web address].   Also download the MyChart app! Go to the app store, search "MyChart", open the app, select [facility], and log in with your MyChart username and password. 

## 2023-02-20 ENCOUNTER — Ambulatory Visit (HOSPITAL_COMMUNITY)
Admission: RE | Admit: 2023-02-20 | Discharge: 2023-02-20 | Disposition: A | Discharge status: Home / Self Care | Payer: Medicare Other | Source: Ambulatory Visit | Attending: Hematology and Oncology | Admitting: Hematology and Oncology

## 2023-02-20 DIAGNOSIS — C9000 Multiple myeloma not having achieved remission: Secondary | ICD-10-CM | POA: Diagnosis not present

## 2023-02-20 DIAGNOSIS — Z96641 Presence of right artificial hip joint: Secondary | ICD-10-CM | POA: Diagnosis not present

## 2023-02-20 DIAGNOSIS — I700 Atherosclerosis of aorta: Secondary | ICD-10-CM | POA: Diagnosis not present

## 2023-02-21 ENCOUNTER — Other Ambulatory Visit (HOSPITAL_COMMUNITY): Payer: Self-pay

## 2023-02-25 ENCOUNTER — Inpatient Hospital Stay: Payer: Medicare Other

## 2023-02-25 ENCOUNTER — Encounter: Payer: Self-pay | Admitting: Hematology and Oncology

## 2023-02-25 ENCOUNTER — Inpatient Hospital Stay: Payer: Medicare Other | Admitting: Physician Assistant

## 2023-02-25 ENCOUNTER — Other Ambulatory Visit: Payer: Self-pay

## 2023-02-25 DIAGNOSIS — Z5112 Encounter for antineoplastic immunotherapy: Secondary | ICD-10-CM | POA: Diagnosis not present

## 2023-02-25 DIAGNOSIS — C9000 Multiple myeloma not having achieved remission: Secondary | ICD-10-CM

## 2023-02-25 DIAGNOSIS — Z79899 Other long term (current) drug therapy: Secondary | ICD-10-CM | POA: Diagnosis not present

## 2023-02-25 DIAGNOSIS — D472 Monoclonal gammopathy: Secondary | ICD-10-CM

## 2023-02-25 LAB — CMP (CANCER CENTER ONLY)
ALT: 47 U/L — ABNORMAL HIGH (ref 0–44)
AST: 32 U/L (ref 15–41)
Albumin: 3.4 g/dL — ABNORMAL LOW (ref 3.5–5.0)
Alkaline Phosphatase: 187 U/L — ABNORMAL HIGH (ref 38–126)
Anion gap: 6 (ref 5–15)
BUN: 13 mg/dL (ref 8–23)
CO2: 28 mmol/L (ref 22–32)
Calcium: 8.7 mg/dL — ABNORMAL LOW (ref 8.9–10.3)
Chloride: 106 mmol/L (ref 98–111)
Creatinine: 0.71 mg/dL (ref 0.44–1.00)
GFR, Estimated: >60 mL/min (ref >60)
Glucose, Bld: 100 mg/dL — ABNORMAL HIGH (ref 70–99)
Potassium: 4.2 mmol/L (ref 3.5–5.1)
Sodium: 140 mmol/L (ref 135–145)
Total Bilirubin: 0.7 mg/dL (ref 0.3–1.2)
Total Protein: 5.4 g/dL — ABNORMAL LOW (ref 6.5–8.1)

## 2023-02-25 LAB — CBC WITH DIFFERENTIAL (CANCER CENTER ONLY)
Abs Immature Granulocytes: 0.25 10*3/uL — ABNORMAL HIGH (ref 0.00–0.07)
Basophils Absolute: 0.1 10*3/uL (ref 0.0–0.1)
Basophils Relative: 1 %
Eosinophils Absolute: 0.3 10*3/uL (ref 0.0–0.5)
Eosinophils Relative: 3 %
HCT: 37.3 % (ref 36.0–46.0)
Hemoglobin: 12.4 g/dL (ref 12.0–15.0)
Immature Granulocytes: 2 %
Lymphocytes Relative: 18 %
Lymphs Abs: 1.9 10*3/uL (ref 0.7–4.0)
MCH: 34.9 pg — ABNORMAL HIGH (ref 26.0–34.0)
MCHC: 33.2 g/dL (ref 30.0–36.0)
MCV: 105.1 fL — ABNORMAL HIGH (ref 80.0–100.0)
Monocytes Absolute: 1.1 10*3/uL — ABNORMAL HIGH (ref 0.1–1.0)
Monocytes Relative: 10 %
Neutro Abs: 6.8 10*3/uL (ref 1.7–7.7)
Neutrophils Relative %: 66 %
Platelet Count: 233 10*3/uL (ref 150–400)
RBC: 3.55 MIL/uL — ABNORMAL LOW (ref 3.87–5.11)
RDW: 13.4 % (ref 11.5–15.5)
WBC Count: 10.4 10*3/uL (ref 4.0–10.5)
nRBC: 0 % (ref 0.0–0.2)

## 2023-02-25 MED ORDER — SODIUM CHLORIDE 0.9 % IV SOLN: Freq: Once | INTRAVENOUS | Status: AC

## 2023-02-25 MED ORDER — SODIUM CHLORIDE 0.9 % IV SOLN
40.0000 mg | Freq: Once | INTRAVENOUS | Status: AC
  Administered 2023-02-25: 40 mg via INTRAVENOUS
  Filled 2023-02-25: qty 4

## 2023-02-25 MED ORDER — DEXTROSE 5 % IV SOLN
70.0000 mg/m2 | Freq: Once | INTRAVENOUS | Status: AC
  Administered 2023-02-25: 120 mg via INTRAVENOUS
  Filled 2023-02-25: qty 60

## 2023-02-25 MED ORDER — SODIUM CHLORIDE 0.9 % IV SOLN: Freq: Once | INTRAVENOUS | Status: DC

## 2023-02-25 NOTE — Patient Instructions (Signed)
[facility]  Discharge Instructions: Thank you for choosing [facility] to provide your oncology and hematology care.   If you have a lab appointment with the Cancer Center, please go directly to the Cancer Center and check in at the registration area.   Wear comfortable clothing and clothing appropriate for easy access to any Portacath or PICC line.   We strive to give you quality time with your provider. You may need to reschedule your appointment if you arrive late (15 or more minutes).  Arriving late affects you and other patients whose appointments are after yours.  Also, if you miss three or more appointments without notifying the office, you may be dismissed from the clinic at the provider's discretion.      For prescription refill requests, have your pharmacy contact our office and allow 72 hours for refills to be completed.    Today you received the following chemotherapy and/or immunotherapy agents :  Kyprolis   To help prevent nausea and vomiting after your treatment, we encourage you to take your nausea medication as directed.  BELOW ARE SYMPTOMS THAT SHOULD BE REPORTED IMMEDIATELY: *FEVER GREATER THAN 100.4 F (38 C) OR HIGHER *CHILLS OR SWEATING *NAUSEA AND VOMITING THAT IS NOT CONTROLLED WITH YOUR NAUSEA MEDICATION *UNUSUAL SHORTNESS OF BREATH *UNUSUAL BRUISING OR BLEEDING *URINARY PROBLEMS (pain or burning when urinating, or frequent urination) *BOWEL PROBLEMS (unusual diarrhea, constipation, pain near the anus) TENDERNESS IN MOUTH AND THROAT WITH OR WITHOUT PRESENCE OF ULCERS (sore throat, sores in mouth, or a toothache) UNUSUAL RASH, SWELLING OR PAIN  UNUSUAL VAGINAL DISCHARGE OR ITCHING   Items with * indicate a potential emergency and should be followed up as soon as possible or go to the Emergency Department if any problems should occur.  Please show the CHEMOTHERAPY ALERT CARD or IMMUNOTHERAPY ALERT CARD at  check-in to the Emergency Department and triage nurse.  Should you have questions after your visit or need to cancel or reschedule your appointment, please contact [facility]  Dept: [phone]  and follow the prompts.  Office hours are 8:00 a.m. to 4:30 p.m. Monday - Friday. Please note that voicemails left after 4:00 p.m. may not be returned until the following business day.  We are closed weekends and major holidays. You have access to a nurse at all times for urgent questions. Please call the main number to the clinic Dept: [phone] and follow the prompts.   For any non-urgent questions, you may also contact your provider using MyChart. We now offer e-Visits for anyone 18 and older to request care online for non-urgent symptoms. For details visit [email or web address].   Also download the MyChart app! Go to the app store, search "MyChart", open the app, select [facility], and log in with your MyChart username and password.   

## 2023-02-25 NOTE — Progress Notes (Signed)
[facility] [phone]   [phone]  PROGRESS NOTE  Patient Care Team: Farris Has, MD as PCP - General (Family Medicine)  CHIEF COMPLAINTS/PURPOSE OF CONSULTATION:  IgG Lambda Multiple Myeloma  ONCOLOGIC HISTORY: 07/27/2018-09/2018: Received weekly velcade plus dexamethasone. Therapy changed due to poor response.  09/2018: Started Revlimid 25 mg for 21 days on, 7 days off of a 28 day cycle with dexamethasone. Therapy was held after 2 cycles because of severe dermatological toxicity 12/2018: Pomalyst 1 mg daily for 21 days.  She completed 4 cycles of therapy and therapy was held due to occurrence of dermatological toxicity.  07/2019: Started Ninlaro 4 mg weekly with dexamethasone 20 mg weekly for 3 weeks on and 1 week off. Daratumumab was addd in April 2021. Clearnce Sorrel was discontinued in November 2021. 05/18/2020: Started monthly daratumumab and transitioned to q 3 month schedule in April 2022.  11/06/2022: Darzalex discontinued due to progression of disease. M protein 0.9 with worsening lytic lesions in the skeleton.  11/20/2022: Cycle 1 Day 1 of Kyprolis/Dex therapy.  12/19/2022: Cycle 2 Day 1 of Kyprolis/Dex therapy 01/15/2023: Cycle 3 Day 1 of Kyprolis/Dex therapy 02/19/2023: Cycle 4 Day 1 of Kyprolis/Dex therapy  HISTORY OF PRESENTING ILLNESS:  Nicole Keith 82 y.o. female returns for a follow up for IgG lambda multiple myeloma. She was last seen on 02/11/2023. She presents today for Cycle 4, Day 8 of Kyprolis/Dex.   On exam today, Ms. Senk reports the frequency of the weekly visits is taking a toll on her. She experiences increased fatigue that last approximately a week and by the time she is back to her baseline, she receives another treatment. She reports the fatigue impacts her sleep cycle and she admits to have some memory loss. She reports her appetite and weight are stable. She denies nausea, vomiting or bowel habit changes. Her rash is  persistent involving her upper extremities and chest and improves with steroid cream.  Overall she is willing and able to proceed with treatment today. She denies fevers, chills, sweats, shortness of breath, chest pain or cough. She has no other complaints. Rest of the 10 point ROS is below.  MEDICAL HISTORY:  Past Medical History:  Diagnosis Date   Allergic rhinitis    Arthritis    Colitis, collagenous    Diverticular disease    severe sig tics/fixation 2012   Family hx of colon cancer    Fracture of femoral neck, right (HCC) 01/09/2013   GERD (gastroesophageal reflux disease)    exacerbation of reflux-like symptoms 04/2011   Hiatal hernia    Hyperlipidemia    muliplt myelom 2020   Osteoarthritis of right hip 01/10/2013    SURGICAL HISTORY: Past Surgical History:  Procedure Laterality Date   CHOLECYSTECTOMY     EYE SURGERY  feb 2016   cataract right eye   LEG SURGERY Left    TONSILLECTOMY     TOTAL HIP ARTHROPLASTY Right 01/10/2013   Procedure: TOTAL HIP ARTHROPLASTY;  Surgeon: Eulas Post, MD;  Location: [facility] OR;  Service: Orthopedics;  Laterality: Right;    SOCIAL HISTORY: Social History   Socioeconomic History   Marital status: Married    Spouse name: Not on file   Number of children: 0   Years of education: Not on file   Highest education level: Some college, no degree  Occupational History   Occupation: retired  Tobacco Use   Smoking status: Former    Current packs/day: 0.00    Types:  Cigarettes    Quit date: 01/10/1972    Years since quitting: 51.1   Smokeless tobacco: Never   Tobacco comments:    quit 1973  Vaping Use   Vaping status: Never Used  Substance and Sexual Activity   Alcohol use: No   Drug use: No   Sexual activity: Not on file  Other Topics Concern   Not on file  Social History Narrative   Married, no children. Drinks 2 cups decaf coffee a day. Walks daily. Is active, plays golf, she and her husband go hiking frequently. 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   Social Determinants of Health   Financial Resource Strain: Not on file  Food Insecurity: No Food Insecurity (01/17/2023)   Hunger Vital Sign    Worried About Running Out of Food in the Last Year: Never true    Ran Out of Food in the Last Year: Never true  Transportation Needs: No Transportation Needs (01/17/2023)   PRAPARE - Administrator, Civil Service (Medical): No    Lack of Transportation (Non-Medical): No  Physical Activity: Not on file  Stress: Not on file  Social Connections: Not on file  Intimate Partner Violence: Not At Risk (01/17/2023)   Humiliation, Afraid, Rape, and Kick questionnaire    Fear of Current or Ex-Partner: No    Emotionally Abused: No    Physically Abused: No    Sexually Abused: No    FAMILY HISTORY: Family History  Problem Relation Age of Onset   Congestive Heart Failure Mother    Arthritis-Osteo Mother    Colon cancer Mother    Heart attack Father    Rheum arthritis Father    Colon cancer Maternal Aunt    Colon cancer Maternal Aunt    Hypothyroidism Sister     ALLERGIES:  is allergic to kyprolis [carfilzomib], pomalyst [pomalidomide], revlimid [lenalidomide], betadine [povidone iodine], flagyl [metronidazole], fosamax [alendronate sodium], bactrim [sulfamethoxazole-trimethoprim], clindamycin/lincomycin, doxepin hcl, hydrocodone-acetaminophen, hydroxyzine, hydroxyzine hcl, tramadol hcl, valium [diazepam], actonel [risedronate sodium], boniva [ibandronic acid], doxycycline, and penicillins.  MEDICATIONS:  Current Outpatient Medications  Medication Sig Dispense Refill   acetaminophen (TYLENOL) 500 MG tablet Take 1,000 mg by mouth See admin instructions. Take 1,000 mg by mouth after breakfast, at 3:30 PM, and at 8:30 PM     albuterol (VENTOLIN HFA) 108 (90 Base) MCG/ACT inhaler SMARTSIG:1 Puff(s) By Mouth Every 4-6 Hours PRN     famotidine (PEPCID) 20 MG tablet Take 1 tablet (20 mg total) by mouth daily.  30 tablet 0   famotidine-calcium carbonate-magnesium hydroxide (PEPCID COMPLETE) 10-800-165 MG chewable tablet Chew 1 tablet by mouth daily as needed (for heartburn or indigestion).      guaiFENesin (MUCINEX) 600 MG 12 hr tablet Take 1 tablet (600 mg total) by mouth 2 (two) times daily. 40 tablet 0   loperamide (IMODIUM) 2 MG capsule Take 1 capsule (2 mg total) by mouth 3 (three) times daily between meals as needed for diarrhea or loose stools. 30 capsule 1   triamcinolone cream (KENALOG) 0.1 % Apply 1 Application topically 2 (two) times daily. (Patient taking differently: Apply 1 Application topically 2 (two) times daily as needed (for allergic flares- affected areas).) 453.6 g 2   VASELINE PURE ULTRA WHITE ointment Apply 1 application  topically See admin instructions. Apply to the genitalia in the morning and at bedtime     acyclovir (ZOVIRAX) 400 MG tablet Take 1 tablet (400 mg total) by mouth 2 (two) times daily. (Patient  not taking: Reported on 01/16/2023) 60 tablet 3   No current facility-administered medications for this visit.   Facility-Administered Medications Ordered in Other Visits  Medication Dose Route Frequency Provider Last Rate Last Admin   0.9 %  sodium chloride infusion   Intravenous Once Jaci Standard, MD        REVIEW OF SYSTEMS:   Constitutional: ( - ) fevers, ( - )  chills , ( - ) night sweats Eyes: ( - ) blurriness of vision, ( - ) double vision, ( - ) watery eyes Ears, nose, mouth, throat, and face: ( - ) mucositis, ( - ) sore throat Respiratory: ( - ) cough, ( - ) dyspnea, ( - ) wheezes Cardiovascular: ( - ) palpitation, ( - ) chest discomfort, ( - ) lower extremity swelling Gastrointestinal:  ( - ) nausea, ( - ) heartburn, ( - ) change in bowel habits Skin: ( +) abnormal skin rashes Lymphatics: ( - ) new lymphadenopathy, ( - ) easy bruising Neurological: ( - ) numbness, ( - ) tingling, ( - ) new weaknesses Behavioral/Psych: ( - ) mood change, ( - ) new changes   All other systems were reviewed with the patient and are negative.  PHYSICAL EXAMINATION: ECOG PERFORMANCE STATUS: 1 - Symptomatic but completely ambulatory  Vitals:   02/25/23 1057  BP: 129/74  Pulse: 71  Resp: 16  Temp: 97.9 F (36.6 C)  SpO2: 98%     Filed Weights   02/25/23 1057  Weight: 127 lb 3.2 oz (57.7 kg)   GENERAL: well appearing female in NAD  SKIN: skin color, texture, turgor are normal. Diffuse, flat erythematous rash.  EYES: conjunctiva are pink and non-injected, sclera clear LYMPH:  no palpable lymphadenopathy in the cervical or supraclavicular lymph nodes.  LUNGS: clear to auscultation and percussion with normal breathing effort HEART: regular rate & rhythm and no murmurs and no lower extremity edema Musculoskeletal: no cyanosis of digits and no clubbing  PSYCH: alert & oriented x 3, fluent speech NEURO: no focal motor/sensory deficits  LABORATORY DATA:  I have reviewed the data as listed    Latest Ref Rng & Units 02/25/2023   10:16 AM 02/19/2023    1:11 PM 02/11/2023    7:26 AM  CBC  WBC 4.0 - 10.5 K/uL 10.4  7.9  6.1   Hemoglobin 12.0 - 15.0 g/dL 16.1  09.6  04.5   Hematocrit 36.0 - 46.0 % 37.3  36.2  39.1   Platelets 150 - 400 K/uL 233  376  412        Latest Ref Rng & Units 02/25/2023   10:16 AM 02/19/2023    1:11 PM 02/11/2023    7:26 AM  CMP  Glucose 70 - 99 mg/dL 409  811  90   BUN 8 - 23 mg/dL 13  11  9    Creatinine 0.44 - 1.00 mg/dL 9.14  7.82  9.56   Sodium 135 - 145 mmol/L 140  139  141   Potassium 3.5 - 5.1 mmol/L 4.2  4.0  4.0   Chloride 98 - 111 mmol/L 106  106  106   CO2 22 - 32 mmol/L 28  26  28    Calcium 8.9 - 10.3 mg/dL 8.7  8.7  8.8   Total Protein 6.5 - 8.1 g/dL 5.4  5.5  5.2   Total Bilirubin 0.3 - 1.2 mg/dL 0.7  0.5  1.0   Alkaline Phos 38 - 126 U/L 187  113  65   AST 15 - 41 U/L 32  22  25   ALT 0 - 44 U/L 47  28  23     ASSESSMENT & PLAN NAHOMI HEGNER is a 82 y.o.female who presents to the clinic for follow up for  multiple myeloma.  #IgG Lambda Multiple Myeloma: --Initially presented in April 2016 with M spike of 3.2 g/dL and IgG level of 1610. Underwent bone marrow biopsy from 11/20/2014 show 29% plasma cells. 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  --In December 2019 with rising M spike of 5.9 g/dL and worsening anemia, with Hgb 8.7, she met criteria for transformation of active multiple myeloma.  --From 07/27/2018-09/2018, received weekly velcade plus dexamethasone. Therapy changed due to poor response.  --In 09/2018, started Revlimid 25 mg for 21 days on, 7 days off of a 28 day cycle with dexamethasone. Therapy was held after 2 cycles because of severe dermatological toxicity --In 12/2018, switched to Pomalyst 1 mg daily for 21 days.  She completed 4 cycles of therapy and therapy was held due to occurrence of dermatological toxicity.  --In 07/2019, switch to Ninlaro 4 mg weekly with dexamethasone 20 mg weekly for 3 weeks on and 1 week off. Daratumumab was addd in April 2021. Clearnce Sorrel was discontinued in November 2021. --On 05/18/2020, started monthly daratumumab and transitioned to q 3 month schedule in April 2022.  --On 11/20/2022, switch to Kyprolis/Dex due to rising M-protein and bone survey that showed worsening lytic lesions.  PLAN:   --Labs today show white blood cell count 10.4, hemoglobin 12.4, MCV 105.1, and platelets of 233, Creatinine is normal.  --Most recent myeloma labs from 02/11/2023 showed improvement of M protein to 0.7.  --Due Cycle 4 Day 8 of Kyprolis/Dex. Proceed without any dose modifications. Discussed that M protein needs to improve, closer to 0.2-0.3 before making any adjustments to the frequency of her treatment. I advised patient that weekly Kyprolis/Dex is standard of care.  --Underwent metastatic survey on 02/20/2023, awaiting final report.  --RTC in 2 weeks for Cycle 5 Day 1 of Kyprolis/Dex with continued weekly treatments.   #Diffuse erythematous  rash-stable --Managed with kenalog cream. -- nystatin powder for groin area.  --Discussed referral to dermatology, patient deferred at this time.   #Supportive Care -- port not required but can be placed if requested.  -- zofran 8mg  q8H PRN and compazine 10mg  PO q6H for nausea -- acyclovir 400mg  PO BID for VCZ prophylaxis -- no pain medication required at this time.  -- discussed role for zometa therapy in the setting her lytic lesions. Explained zometa is given q 12 weeks and requires a dental clearance.  After discussing this with the patient further today she knows she would like to hold on this for the time being as she is afraid of the side effects.  Strongly emphasized that she should reconsider as this will be helpful with her lytic bone lesions.  No orders of the defined types were placed in this encounter.  All questions were answered. The patient knows to call the clinic with any problems, questions or concerns.  I have spent a total of 30 minutes minutes of face-to-face and non-face-to-face time, preparing to see the patient,  performing a medically appropriate examination, counseling and educating the patient, ordering medications/tests/procedures,documenting clinical information in the electronic health record, and care coordination.   Georga Kaufmann PA-C Dept of Hematology and Oncology [facility] at [facility] Phone: [phone]

## 2023-02-27 ENCOUNTER — Encounter: Payer: Self-pay | Admitting: Hematology and Oncology

## 2023-02-27 ENCOUNTER — Ambulatory Visit: Payer: Medicare Other | Admitting: Hematology and Oncology

## 2023-02-27 ENCOUNTER — Ambulatory Visit: Payer: Medicare Other

## 2023-02-27 ENCOUNTER — Other Ambulatory Visit: Payer: Medicare Other

## 2023-03-03 ENCOUNTER — Telehealth: Payer: Self-pay | Admitting: Hematology and Oncology

## 2023-03-04 ENCOUNTER — Ambulatory Visit: Payer: Medicare Other

## 2023-03-04 ENCOUNTER — Other Ambulatory Visit: Payer: Medicare Other

## 2023-03-04 ENCOUNTER — Ambulatory Visit: Payer: Medicare Other | Admitting: Hematology and Oncology

## 2023-03-05 ENCOUNTER — Other Ambulatory Visit: Payer: Medicare Other

## 2023-03-05 ENCOUNTER — Ambulatory Visit: Payer: Medicare Other

## 2023-03-10 DIAGNOSIS — C9000 Multiple myeloma not having achieved remission: Secondary | ICD-10-CM | POA: Diagnosis not present

## 2023-03-10 DIAGNOSIS — R7303 Prediabetes: Secondary | ICD-10-CM | POA: Diagnosis not present

## 2023-03-10 DIAGNOSIS — E785 Hyperlipidemia, unspecified: Secondary | ICD-10-CM | POA: Diagnosis not present

## 2023-03-10 DIAGNOSIS — E559 Vitamin D deficiency, unspecified: Secondary | ICD-10-CM | POA: Diagnosis not present

## 2023-03-10 DIAGNOSIS — I700 Atherosclerosis of aorta: Secondary | ICD-10-CM | POA: Diagnosis not present

## 2023-03-10 DIAGNOSIS — I6529 Occlusion and stenosis of unspecified carotid artery: Secondary | ICD-10-CM | POA: Diagnosis not present

## 2023-03-10 DIAGNOSIS — Z0000 Encounter for general adult medical examination without abnormal findings: Secondary | ICD-10-CM | POA: Diagnosis not present

## 2023-03-12 ENCOUNTER — Other Ambulatory Visit: Payer: Medicare Other

## 2023-03-12 ENCOUNTER — Ambulatory Visit: Payer: Medicare Other

## 2023-03-12 ENCOUNTER — Ambulatory Visit: Payer: Medicare Other | Admitting: Hematology and Oncology

## 2023-03-17 NOTE — Progress Notes (Unsigned)
[facility] [phone]   [phone]  PROGRESS NOTE  Patient Care Team: Farris Has, MD as PCP - General (Family Medicine)  CHIEF COMPLAINTS/PURPOSE OF CONSULTATION:  IgG Lambda Multiple Myeloma  ONCOLOGIC HISTORY: 07/27/2018-09/2018: Received weekly velcade plus dexamethasone. Therapy changed due to poor response.  09/2018: Started Revlimid 25 mg for 21 days on, 7 days off of a 28 day cycle with dexamethasone. Therapy was held after 2 cycles because of severe dermatological toxicity 12/2018: Pomalyst 1 mg daily for 21 days.  She completed 4 cycles of therapy and therapy was held due to occurrence of dermatological toxicity.  07/2019: Started Ninlaro 4 mg weekly with dexamethasone 20 mg weekly for 3 weeks on and 1 week off. Daratumumab was addd in April 2021. Clearnce Sorrel was discontinued in November 2021. 05/18/2020: Started monthly daratumumab and transitioned to q 3 month schedule in April 2022.  11/06/2022: Darzalex discontinued due to progression of disease. M protein 0.9 with worsening lytic lesions in the skeleton.  11/20/2022: Cycle 1 Day 1 of Kyprolis/Dex therapy.  12/19/2022: Cycle 2 Day 1 of Kyprolis/Dex therapy 01/15/2023: Cycle 3 Day 1 of Kyprolis/Dex therapy 02/19/2023: Cycle 4 Day 1 of Kyprolis/Dex therapy 03/18/2023: Cycle 5 Day 1 of Kyprolis/Dex therapy  HISTORY OF PRESENTING ILLNESS:  Nicole Keith 82 y.o. female returns for a follow up for IgG lambda multiple myeloma. She was last seen on 02/25/2023. She presents today for Cycle 5, Day 1 of Kyprolis/Dex.   On exam today, Ms. Butkus reports that she is disheartened she has not been able to visit her summer camp site due to her frequent dosing of Kyprolis.  She reports that this has been hard on her husband.  She notes he has a rash that.  It pops up on her arms and chest but does respond well to her triamcinolone cream.  She also puts Vaseline on more sensitive areas to deal with the rash.  She  reports she is having some issues with fatigue in the afternoon, loss of memory and "quivering".  She notes that her appetite has been good though she is down about 5 pounds in the interim since last month.  She reports that she is try to cut out certain foods and trying not to eat past 5 PM.  Otherwise she is tolerating her Kyprolis therapy well with no numbness tingling of her fingers and toes.  She is willing and able to proceed with treatment at this time.  She denies fevers, chills, sweats, shortness of breath, chest pain or cough. She has no other complaints. Rest of the 10 point ROS is below.  MEDICAL HISTORY:  Past Medical History:  Diagnosis Date   Allergic rhinitis    Arthritis    Colitis, collagenous    Diverticular disease    severe sig tics/fixation 2012   Family hx of colon cancer    Fracture of femoral neck, right (HCC) 01/09/2013   GERD (gastroesophageal reflux disease)    exacerbation of reflux-like symptoms 04/2011   Hiatal hernia    Hyperlipidemia    muliplt myelom 2020   Osteoarthritis of right hip 01/10/2013    SURGICAL HISTORY: Past Surgical History:  Procedure Laterality Date   CHOLECYSTECTOMY     EYE SURGERY  feb 2016   cataract right eye   LEG SURGERY Left    TONSILLECTOMY     TOTAL HIP ARTHROPLASTY Right 01/10/2013   Procedure: TOTAL HIP ARTHROPLASTY;  Surgeon: Eulas Post, MD;  Location: [facility] OR;  Service: Orthopedics;  Laterality: Right;    SOCIAL HISTORY: Social History   Socioeconomic History   Marital status: Married    Spouse name: Not on file   Number of children: 0   Years of education: Not on file   Highest education level: Some college, no degree  Occupational History   Occupation: retired  Tobacco Use   Smoking status: Former    Current packs/day: 0.00    Types: Cigarettes    Quit date: 01/10/1972    Years since quitting: 51.2   Smokeless tobacco: Never   Tobacco comments:    quit 1973  Vaping Use   Vaping status: Never Used   Substance and Sexual Activity   Alcohol use: No   Drug use: No   Sexual activity: Not on file  Other Topics Concern   Not on file  Social History Narrative   Married, no children. Drinks 2 cups decaf coffee a day. Walks daily. Is active, plays golf, she and her husband go hiking frequently. 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   Social Determinants of Health   Financial Resource Strain: Not on file  Food Insecurity: No Food Insecurity (01/17/2023)   Hunger Vital Sign    Worried About Running Out of Food in the Last Year: Never true    Ran Out of Food in the Last Year: Never true  Transportation Needs: No Transportation Needs (01/17/2023)   PRAPARE - Administrator, Civil Service (Medical): No    Lack of Transportation (Non-Medical): No  Physical Activity: Not on file  Stress: Not on file  Social Connections: Not on file  Intimate Partner Violence: Not At Risk (01/17/2023)   Humiliation, Afraid, Rape, and Kick questionnaire    Fear of Current or Ex-Partner: No    Emotionally Abused: No    Physically Abused: No    Sexually Abused: No    FAMILY HISTORY: Family History  Problem Relation Age of Onset   Congestive Heart Failure Mother    Arthritis-Osteo Mother    Colon cancer Mother    Heart attack Father    Rheum arthritis Father    Colon cancer Maternal Aunt    Colon cancer Maternal Aunt    Hypothyroidism Sister     ALLERGIES:  is allergic to kyprolis [carfilzomib], pomalyst [pomalidomide], revlimid [lenalidomide], betadine [povidone iodine], flagyl [metronidazole], fosamax [alendronate sodium], bactrim [sulfamethoxazole-trimethoprim], clindamycin/lincomycin, doxepin hcl, hydrocodone-acetaminophen, hydroxyzine, hydroxyzine hcl, tramadol hcl, valium [diazepam], actonel [risedronate sodium], boniva [ibandronic acid], doxycycline, and penicillins.  MEDICATIONS:  Current Outpatient Medications  Medication Sig Dispense Refill   acetaminophen (TYLENOL)  500 MG tablet Take 1,000 mg by mouth See admin instructions. Take 1,000 mg by mouth after breakfast, at 3:30 PM, and at 8:30 PM     acyclovir (ZOVIRAX) 400 MG tablet Take 1 tablet (400 mg total) by mouth 2 (two) times daily. (Patient not taking: Reported on 01/16/2023) 60 tablet 3   albuterol (VENTOLIN HFA) 108 (90 Base) MCG/ACT inhaler SMARTSIG:1 Puff(s) By Mouth Every 4-6 Hours PRN     famotidine (PEPCID) 20 MG tablet Take 1 tablet (20 mg total) by mouth daily. 30 tablet 0   famotidine-calcium carbonate-magnesium hydroxide (PEPCID COMPLETE) 10-800-165 MG chewable tablet Chew 1 tablet by mouth daily as needed (for heartburn or indigestion).      guaiFENesin (MUCINEX) 600 MG 12 hr tablet Take 1 tablet (600 mg total) by mouth 2 (two) times daily. 40 tablet 0   loperamide (IMODIUM) 2 MG capsule Take 1  capsule (2 mg total) by mouth 3 (three) times daily between meals as needed for diarrhea or loose stools. 30 capsule 1   triamcinolone cream (KENALOG) 0.1 % Apply 1 Application topically 2 (two) times daily. (Patient taking differently: Apply 1 Application topically 2 (two) times daily as needed (for allergic flares- affected areas).) 453.6 g 2   VASELINE PURE ULTRA WHITE ointment Apply 1 application  topically See admin instructions. Apply to the genitalia in the morning and at bedtime     No current facility-administered medications for this visit.    REVIEW OF SYSTEMS:   Constitutional: ( - ) fevers, ( - )  chills , ( - ) night sweats Eyes: ( - ) blurriness of vision, ( - ) double vision, ( - ) watery eyes Ears, nose, mouth, throat, and face: ( - ) mucositis, ( - ) sore throat Respiratory: ( - ) cough, ( - ) dyspnea, ( - ) wheezes Cardiovascular: ( - ) palpitation, ( - ) chest discomfort, ( - ) lower extremity swelling Gastrointestinal:  ( - ) nausea, ( - ) heartburn, ( - ) change in bowel habits Skin: ( +) abnormal skin rashes Lymphatics: ( - ) new lymphadenopathy, ( - ) easy  bruising Neurological: ( - ) numbness, ( - ) tingling, ( - ) new weaknesses Behavioral/Psych: ( - ) mood change, ( - ) new changes  All other systems were reviewed with the patient and are negative.  PHYSICAL EXAMINATION: ECOG PERFORMANCE STATUS: 1 - Symptomatic but completely ambulatory  Vitals:   03/18/23 0944  BP: 134/81  Pulse: 75  Resp: 15  Temp: 97.9 F (36.6 C)  SpO2: 99%      Filed Weights   03/18/23 0944  Weight: 123 lb 1.6 oz (55.8 kg)    GENERAL: well appearing female in NAD  SKIN: skin color, texture, turgor are normal. Diffuse, flat erythematous rash.  EYES: conjunctiva are pink and non-injected, sclera clear LYMPH:  no palpable lymphadenopathy in the cervical or supraclavicular lymph nodes.  LUNGS: clear to auscultation and percussion with normal breathing effort HEART: regular rate & rhythm and no murmurs and no lower extremity edema Musculoskeletal: no cyanosis of digits and no clubbing  PSYCH: alert & oriented x 3, fluent speech NEURO: no focal motor/sensory deficits  LABORATORY DATA:  I have reviewed the data as listed    Latest Ref Rng & Units 03/18/2023    8:59 AM 02/25/2023   10:16 AM 02/19/2023    1:11 PM  CBC  WBC 4.0 - 10.5 K/uL 7.0  10.4  7.9   Hemoglobin 12.0 - 15.0 g/dL 43.3  29.5  18.8   Hematocrit 36.0 - 46.0 % 40.8  37.3  36.2   Platelets 150 - 400 K/uL 418  233  376        Latest Ref Rng & Units 03/18/2023    8:59 AM 02/25/2023   10:16 AM 02/19/2023    1:11 PM  CMP  Glucose 70 - 99 mg/dL 416  606  301   BUN 8 - 23 mg/dL 9  13  11    Creatinine 0.44 - 1.00 mg/dL 6.01  0.93  2.35   Sodium 135 - 145 mmol/L 140  140  139   Potassium 3.5 - 5.1 mmol/L 4.1  4.2  4.0   Chloride 98 - 111 mmol/L 105  106  106   CO2 22 - 32 mmol/L 28  28  26    Calcium 8.9 - 10.3 mg/dL  8.8  8.7  8.7   Total Protein 6.5 - 8.1 g/dL 6.0  5.4  5.5   Total Bilirubin 0.3 - 1.2 mg/dL 0.6  0.7  0.5   Alkaline Phos 38 - 126 U/L 116  187  113   AST 15 - 41 U/L 29  32   22   ALT 0 - 44 U/L 27  47  28     ASSESSMENT & PLAN DARLEN STAVIS is a 82 y.o.female who presents to the clinic for follow up for multiple myeloma.  #IgG Lambda Multiple Myeloma: --Initially presented in April 2016 with M spike of 3.2 g/dL and IgG level of 3086. Underwent bone marrow biopsy from 11/20/2014 show 29% plasma cells. 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  --In December 2019 with rising M spike of 5.9 g/dL and worsening anemia, with Hgb 8.7, she met criteria for transformation of active multiple myeloma.  --From 07/27/2018-09/2018, received weekly velcade plus dexamethasone. Therapy changed due to poor response.  --In 09/2018, started Revlimid 25 mg for 21 days on, 7 days off of a 28 day cycle with dexamethasone. Therapy was held after 2 cycles because of severe dermatological toxicity --In 12/2018, switched to Pomalyst 1 mg daily for 21 days.  She completed 4 cycles of therapy and therapy was held due to occurrence of dermatological toxicity.  --In 07/2019, switch to Ninlaro 4 mg weekly with dexamethasone 20 mg weekly for 3 weeks on and 1 week off. Daratumumab was addd in April 2021. Clearnce Sorrel was discontinued in November 2021. --On 05/18/2020, started monthly daratumumab and transitioned to q 3 month schedule in April 2022.  --On 11/20/2022, switch to Kyprolis/Dex due to rising M-protein and bone survey that showed worsening lytic lesions.  PLAN:   --Labs today show white blood cell count 7.0, Hgb 13.4, MCV 103.6, Plt 418, Creatinine is normal.  --Most recent myeloma labs from 02/11/2023 showed improvement of M protein to 0.7.  --Due Cycle 5 Day 1 of Kyprolis/Dex. Proceed without any dose modifications. Discussed that M protein needs to improve, closer to 0.2-0.3 before making any adjustments to the frequency of her treatment. I advised patient that weekly Kyprolis/Dex is standard of care.  --Underwent metastatic survey on 02/20/2023, awaiting final report.  --RTC  in 2 weeks for Cycle 5 Day 15 of Kyprolis/Dex with continued weekly treatments.   #Diffuse erythematous rash-stable --Managed with kenalog cream. -- nystatin powder for groin area.  --Discussed referral to dermatology, patient deferred at this time.   #Supportive Care -- port not required but can be placed if requested.  -- zofran 8mg  q8H PRN and compazine 10mg  PO q6H for nausea -- acyclovir 400mg  PO BID for VCZ prophylaxis -- no pain medication required at this time.  -- discussed role for zometa therapy in the setting her lytic lesions. Explained zometa is given q 12 weeks and requires a dental clearance.  After discussing this with the patient further today she knows she would like to hold on this for the time being as she is afraid of the side effects.  Strongly emphasized that she should reconsider as this will be helpful with her lytic bone lesions.  Orders Placed This Encounter  Procedures   Multiple Myeloma Panel (SPEP&IFE w/QIG)    Standing Status:   Future    Standing Expiration Date:   04/15/2024   Kappa/lambda light chains    Standing Status:   Future    Standing Expiration Date:   04/15/2024   CBC with  Differential (Cancer Center Only)    Standing Status:   Future    Standing Expiration Date:   04/15/2024   CMP (Cancer Center only)    Standing Status:   Future    Standing Expiration Date:   04/15/2024   CBC with Differential (Cancer Center Only)    Standing Status:   Future    Standing Expiration Date:   04/22/2024   CMP (Cancer Center only)    Standing Status:   Future    Standing Expiration Date:   04/22/2024   CBC with Differential (Cancer Center Only)    Standing Status:   Future    Standing Expiration Date:   04/29/2024   CMP (Cancer Center only)    Standing Status:   Future    Standing Expiration Date:   04/29/2024   All questions were answered. The patient knows to call the clinic with any problems, questions or concerns.  I have spent a total of 30 minutes minutes  of face-to-face and non-face-to-face time, preparing to see the patient,  performing a medically appropriate examination, counseling and educating the patient, ordering medications/tests/procedures,documenting clinical information in the electronic health record, and care coordination.   Ulysees Barns, MD Department of Hematology/Oncology [facility] at [facility] Phone: [phone] Pager: [phone] Email: [email or web address]

## 2023-03-18 ENCOUNTER — Other Ambulatory Visit: Payer: Self-pay

## 2023-03-18 ENCOUNTER — Inpatient Hospital Stay (HOSPITAL_BASED_OUTPATIENT_CLINIC_OR_DEPARTMENT_OTHER): Payer: Medicare Other | Admitting: Hematology and Oncology

## 2023-03-18 ENCOUNTER — Inpatient Hospital Stay: Payer: Medicare Other | Attending: Physician Assistant

## 2023-03-18 ENCOUNTER — Inpatient Hospital Stay: Payer: Medicare Other

## 2023-03-18 VITALS — BP 134/81 | HR 75 | Temp 97.9°F | Resp 15 | Wt 123.1 lb

## 2023-03-18 DIAGNOSIS — Z800 Family history of malignant neoplasm of digestive organs: Secondary | ICD-10-CM

## 2023-03-18 DIAGNOSIS — C9000 Multiple myeloma not having achieved remission: Secondary | ICD-10-CM

## 2023-03-18 DIAGNOSIS — Z79899 Other long term (current) drug therapy: Secondary | ICD-10-CM | POA: Insufficient documentation

## 2023-03-18 DIAGNOSIS — L539 Erythematous condition, unspecified: Secondary | ICD-10-CM | POA: Diagnosis not present

## 2023-03-18 DIAGNOSIS — Z5112 Encounter for antineoplastic immunotherapy: Secondary | ICD-10-CM | POA: Insufficient documentation

## 2023-03-18 DIAGNOSIS — Z87891 Personal history of nicotine dependence: Secondary | ICD-10-CM

## 2023-03-18 LAB — CBC WITH DIFFERENTIAL (CANCER CENTER ONLY)
Abs Immature Granulocytes: 0.01 10*3/uL (ref 0.00–0.07)
Basophils Absolute: 0.1 10*3/uL (ref 0.0–0.1)
Basophils Relative: 1 %
Eosinophils Absolute: 0.1 10*3/uL (ref 0.0–0.5)
Eosinophils Relative: 1 %
HCT: 40.8 % (ref 36.0–46.0)
Hemoglobin: 13.4 g/dL (ref 12.0–15.0)
Immature Granulocytes: 0 %
Lymphocytes Relative: 29 %
Lymphs Abs: 2 10*3/uL (ref 0.7–4.0)
MCH: 34 pg (ref 26.0–34.0)
MCHC: 32.8 g/dL (ref 30.0–36.0)
MCV: 103.6 fL — ABNORMAL HIGH (ref 80.0–100.0)
Monocytes Absolute: 0.6 10*3/uL (ref 0.1–1.0)
Monocytes Relative: 8 %
Neutro Abs: 4.2 10*3/uL (ref 1.7–7.7)
Neutrophils Relative %: 61 %
Platelet Count: 418 10*3/uL — ABNORMAL HIGH (ref 150–400)
RBC: 3.94 MIL/uL (ref 3.87–5.11)
RDW: 12.2 % (ref 11.5–15.5)
WBC Count: 7 10*3/uL (ref 4.0–10.5)
nRBC: 0 % (ref 0.0–0.2)

## 2023-03-18 LAB — CMP (CANCER CENTER ONLY)
ALT: 27 U/L (ref 0–44)
AST: 29 U/L (ref 15–41)
Albumin: 3.8 g/dL (ref 3.5–5.0)
Alkaline Phosphatase: 116 U/L (ref 38–126)
Anion gap: 7 (ref 5–15)
BUN: 9 mg/dL (ref 8–23)
CO2: 28 mmol/L (ref 22–32)
Calcium: 8.8 mg/dL — ABNORMAL LOW (ref 8.9–10.3)
Chloride: 105 mmol/L (ref 98–111)
Creatinine: 0.67 mg/dL (ref 0.44–1.00)
GFR, Estimated: >60 mL/min (ref >60)
Glucose, Bld: 114 mg/dL — ABNORMAL HIGH (ref 70–99)
Potassium: 4.1 mmol/L (ref 3.5–5.1)
Sodium: 140 mmol/L (ref 135–145)
Total Bilirubin: 0.6 mg/dL (ref 0.3–1.2)
Total Protein: 6 g/dL — ABNORMAL LOW (ref 6.5–8.1)

## 2023-03-18 MED ORDER — SODIUM CHLORIDE 0.9 % IV SOLN
40.0000 mg | Freq: Once | INTRAVENOUS | Status: AC
  Administered 2023-03-18: 40 mg via INTRAVENOUS
  Filled 2023-03-18: qty 4

## 2023-03-18 MED ORDER — SODIUM CHLORIDE 0.9 % IV SOLN: Freq: Once | INTRAVENOUS | Status: AC

## 2023-03-18 MED ORDER — DEXTROSE 5 % IV SOLN
70.0000 mg/m2 | Freq: Once | INTRAVENOUS | Status: AC
  Administered 2023-03-18: 120 mg via INTRAVENOUS
  Filled 2023-03-18: qty 60

## 2023-03-18 NOTE — Patient Instructions (Signed)
[facility]  Discharge Instructions: Thank you for choosing [facility] to provide your oncology and hematology care.   If you have a lab appointment with the Cancer Center, please go directly to the Cancer Center and check in at the registration area.   Wear comfortable clothing and clothing appropriate for easy access to any Portacath or PICC line.   We strive to give you quality time with your provider. You may need to reschedule your appointment if you arrive late (15 or more minutes).  Arriving late affects you and other patients whose appointments are after yours.  Also, if you miss three or more appointments without notifying the office, you may be dismissed from the clinic at the provider's discretion.      For prescription refill requests, have your pharmacy contact our office and allow 72 hours for refills to be completed.    Today you received the following chemotherapy and/or immunotherapy agents :  Kyprolis   To help prevent nausea and vomiting after your treatment, we encourage you to take your nausea medication as directed.  BELOW ARE SYMPTOMS THAT SHOULD BE REPORTED IMMEDIATELY: *FEVER GREATER THAN 100.4 F (38 C) OR HIGHER *CHILLS OR SWEATING *NAUSEA AND VOMITING THAT IS NOT CONTROLLED WITH YOUR NAUSEA MEDICATION *UNUSUAL SHORTNESS OF BREATH *UNUSUAL BRUISING OR BLEEDING *URINARY PROBLEMS (pain or burning when urinating, or frequent urination) *BOWEL PROBLEMS (unusual diarrhea, constipation, pain near the anus) TENDERNESS IN MOUTH AND THROAT WITH OR WITHOUT PRESENCE OF ULCERS (sore throat, sores in mouth, or a toothache) UNUSUAL RASH, SWELLING OR PAIN  UNUSUAL VAGINAL DISCHARGE OR ITCHING   Items with * indicate a potential emergency and should be followed up as soon as possible or go to the Emergency Department if any problems should occur.  Please show the CHEMOTHERAPY ALERT CARD or IMMUNOTHERAPY ALERT CARD at  check-in to the Emergency Department and triage nurse.  Should you have questions after your visit or need to cancel or reschedule your appointment, please contact [facility]  Dept: [phone]  and follow the prompts.  Office hours are 8:00 a.m. to 4:30 p.m. Monday - Friday. Please note that voicemails left after 4:00 p.m. may not be returned until the following business day.  We are closed weekends and major holidays. You have access to a nurse at all times for urgent questions. Please call the main number to the clinic Dept: [phone] and follow the prompts.   For any non-urgent questions, you may also contact your provider using MyChart. We now offer e-Visits for anyone 18 and older to request care online for non-urgent symptoms. For details visit [email or web address].   Also download the MyChart app! Go to the app store, search "MyChart", open the app, select [facility], and log in with your MyChart username and password.   

## 2023-03-19 ENCOUNTER — Ambulatory Visit: Payer: Medicare Other

## 2023-03-19 ENCOUNTER — Other Ambulatory Visit: Payer: Medicare Other

## 2023-03-24 ENCOUNTER — Other Ambulatory Visit: Payer: Self-pay

## 2023-03-26 ENCOUNTER — Inpatient Hospital Stay: Payer: Medicare Other

## 2023-03-26 ENCOUNTER — Ambulatory Visit: Payer: Medicare Other

## 2023-03-26 ENCOUNTER — Other Ambulatory Visit: Payer: Medicare Other

## 2023-03-26 ENCOUNTER — Ambulatory Visit: Payer: Medicare Other | Admitting: Hematology and Oncology

## 2023-03-26 VITALS — BP 132/64 | HR 72 | Temp 98.2°F | Resp 18 | Wt 123.2 lb

## 2023-03-26 DIAGNOSIS — Z5112 Encounter for antineoplastic immunotherapy: Secondary | ICD-10-CM | POA: Diagnosis not present

## 2023-03-26 DIAGNOSIS — Z79899 Other long term (current) drug therapy: Secondary | ICD-10-CM | POA: Diagnosis not present

## 2023-03-26 DIAGNOSIS — C9000 Multiple myeloma not having achieved remission: Secondary | ICD-10-CM

## 2023-03-26 LAB — CMP (CANCER CENTER ONLY)
ALT: 23 U/L (ref 0–44)
AST: 25 U/L (ref 15–41)
Albumin: 3.7 g/dL (ref 3.5–5.0)
Alkaline Phosphatase: 86 U/L (ref 38–126)
Anion gap: 5 (ref 5–15)
BUN: 9 mg/dL (ref 8–23)
CO2: 28 mmol/L (ref 22–32)
Calcium: 8.4 mg/dL — ABNORMAL LOW (ref 8.9–10.3)
Chloride: 105 mmol/L (ref 98–111)
Creatinine: 0.66 mg/dL (ref 0.44–1.00)
GFR, Estimated: >60 mL/min (ref >60)
Glucose, Bld: 91 mg/dL (ref 70–99)
Potassium: 4 mmol/L (ref 3.5–5.1)
Sodium: 138 mmol/L (ref 135–145)
Total Bilirubin: 0.6 mg/dL (ref 0.3–1.2)
Total Protein: 5.8 g/dL — ABNORMAL LOW (ref 6.5–8.1)

## 2023-03-26 LAB — CBC WITH DIFFERENTIAL (CANCER CENTER ONLY)
Abs Immature Granulocytes: 0.06 K/uL (ref 0.00–0.07)
Basophils Absolute: 0.1 K/uL (ref 0.0–0.1)
Basophils Relative: 1 %
Eosinophils Absolute: 0.4 K/uL (ref 0.0–0.5)
Eosinophils Relative: 5 %
HCT: 41 % (ref 36.0–46.0)
Hemoglobin: 13.5 g/dL (ref 12.0–15.0)
Immature Granulocytes: 1 %
Lymphocytes Relative: 27 %
Lymphs Abs: 2.2 K/uL (ref 0.7–4.0)
MCH: 33.8 pg (ref 26.0–34.0)
MCHC: 32.9 g/dL (ref 30.0–36.0)
MCV: 102.5 fL — ABNORMAL HIGH (ref 80.0–100.0)
Monocytes Absolute: 0.9 K/uL (ref 0.1–1.0)
Monocytes Relative: 11 %
Neutro Abs: 4.5 K/uL (ref 1.7–7.7)
Neutrophils Relative %: 55 %
Platelet Count: 254 K/uL (ref 150–400)
RBC: 4 MIL/uL (ref 3.87–5.11)
RDW: 12.3 % (ref 11.5–15.5)
WBC Count: 8.1 K/uL (ref 4.0–10.5)
nRBC: 0 % (ref 0.0–0.2)

## 2023-03-26 MED ORDER — DEXTROSE 5 % IV SOLN
70.0000 mg/m2 | Freq: Once | INTRAVENOUS | Status: AC
  Administered 2023-03-26: 120 mg via INTRAVENOUS
  Filled 2023-03-26: qty 60

## 2023-03-26 MED ORDER — SODIUM CHLORIDE 0.9 % IV SOLN: Freq: Once | INTRAVENOUS | Status: AC

## 2023-03-26 MED ORDER — SODIUM CHLORIDE 0.9 % IV SOLN
40.0000 mg | Freq: Once | INTRAVENOUS | Status: AC
  Administered 2023-03-26: 40 mg via INTRAVENOUS
  Filled 2023-03-26: qty 4

## 2023-03-26 NOTE — Patient Instructions (Signed)
[facility]  Discharge Instructions: Thank you for choosing [facility] to provide your oncology and hematology care.   If you have a lab appointment with the Cancer Center, please go directly to the Cancer Center and check in at the registration area.   Wear comfortable clothing and clothing appropriate for easy access to any Portacath or PICC line.   We strive to give you quality time with your provider. You may need to reschedule your appointment if you arrive late (15 or more minutes).  Arriving late affects you and other patients whose appointments are after yours.  Also, if you miss three or more appointments without notifying the office, you may be dismissed from the clinic at the provider's discretion.      For prescription refill requests, have your pharmacy contact our office and allow 72 hours for refills to be completed.    Today you received the following chemotherapy and/or immunotherapy agents :  Kyprolis   To help prevent nausea and vomiting after your treatment, we encourage you to take your nausea medication as directed.  BELOW ARE SYMPTOMS THAT SHOULD BE REPORTED IMMEDIATELY: *FEVER GREATER THAN 100.4 F (38 C) OR HIGHER *CHILLS OR SWEATING *NAUSEA AND VOMITING THAT IS NOT CONTROLLED WITH YOUR NAUSEA MEDICATION *UNUSUAL SHORTNESS OF BREATH *UNUSUAL BRUISING OR BLEEDING *URINARY PROBLEMS (pain or burning when urinating, or frequent urination) *BOWEL PROBLEMS (unusual diarrhea, constipation, pain near the anus) TENDERNESS IN MOUTH AND THROAT WITH OR WITHOUT PRESENCE OF ULCERS (sore throat, sores in mouth, or a toothache) UNUSUAL RASH, SWELLING OR PAIN  UNUSUAL VAGINAL DISCHARGE OR ITCHING   Items with * indicate a potential emergency and should be followed up as soon as possible or go to the Emergency Department if any problems should occur.  Please show the CHEMOTHERAPY ALERT CARD or IMMUNOTHERAPY ALERT CARD at  check-in to the Emergency Department and triage nurse.  Should you have questions after your visit or need to cancel or reschedule your appointment, please contact [facility]  Dept: [phone]  and follow the prompts.  Office hours are 8:00 a.m. to 4:30 p.m. Monday - Friday. Please note that voicemails left after 4:00 p.m. may not be returned until the following business day.  We are closed weekends and major holidays. You have access to a nurse at all times for urgent questions. Please call the main number to the clinic Dept: [phone] and follow the prompts.   For any non-urgent questions, you may also contact your provider using MyChart. We now offer e-Visits for anyone 18 and older to request care online for non-urgent symptoms. For details visit [email or web address].   Also download the MyChart app! Go to the app store, search "MyChart", open the app, select [facility], and log in with your MyChart username and password.   

## 2023-03-28 ENCOUNTER — Emergency Department (HOSPITAL_COMMUNITY)
Admission: EM | Admit: 2023-03-28 | Discharge: 2023-03-28 | Disposition: A | Discharge status: Home / Self Care | Payer: Medicare Other | Attending: Emergency Medicine | Admitting: Emergency Medicine

## 2023-03-28 ENCOUNTER — Encounter (HOSPITAL_COMMUNITY): Payer: Self-pay | Admitting: Emergency Medicine

## 2023-03-28 ENCOUNTER — Other Ambulatory Visit: Payer: Self-pay

## 2023-03-28 ENCOUNTER — Emergency Department (HOSPITAL_COMMUNITY): Payer: Medicare Other

## 2023-03-28 DIAGNOSIS — W01198A Fall on same level from slipping, tripping and stumbling with subsequent striking against other object, initial encounter: Secondary | ICD-10-CM | POA: Diagnosis not present

## 2023-03-28 DIAGNOSIS — R9431 Abnormal electrocardiogram [ECG] [EKG]: Secondary | ICD-10-CM | POA: Diagnosis not present

## 2023-03-28 DIAGNOSIS — S0003XA Contusion of scalp, initial encounter: Secondary | ICD-10-CM | POA: Diagnosis not present

## 2023-03-28 DIAGNOSIS — S0990XA Unspecified injury of head, initial encounter: Secondary | ICD-10-CM | POA: Diagnosis not present

## 2023-03-28 DIAGNOSIS — M25551 Pain in right hip: Secondary | ICD-10-CM | POA: Diagnosis not present

## 2023-03-28 DIAGNOSIS — R55 Syncope and collapse: Secondary | ICD-10-CM | POA: Insufficient documentation

## 2023-03-28 DIAGNOSIS — M1612 Unilateral primary osteoarthritis, left hip: Secondary | ICD-10-CM | POA: Diagnosis not present

## 2023-03-28 DIAGNOSIS — W19XXXA Unspecified fall, initial encounter: Secondary | ICD-10-CM

## 2023-03-28 DIAGNOSIS — R9082 White matter disease, unspecified: Secondary | ICD-10-CM | POA: Diagnosis not present

## 2023-03-28 DIAGNOSIS — Z471 Aftercare following joint replacement surgery: Secondary | ICD-10-CM | POA: Diagnosis not present

## 2023-03-28 DIAGNOSIS — Z96641 Presence of right artificial hip joint: Secondary | ICD-10-CM | POA: Diagnosis not present

## 2023-03-28 LAB — CBC WITH DIFFERENTIAL/PLATELET
Abs Immature Granulocytes: 0.05 10*3/uL (ref 0.00–0.07)
Basophils Absolute: 0 10*3/uL (ref 0.0–0.1)
Basophils Relative: 0 %
Eosinophils Absolute: 0.1 10*3/uL (ref 0.0–0.5)
Eosinophils Relative: 1 %
HCT: 41 % (ref 36.0–46.0)
Hemoglobin: 13.3 g/dL (ref 12.0–15.0)
Immature Granulocytes: 1 %
Lymphocytes Relative: 16 %
Lymphs Abs: 1 10*3/uL (ref 0.7–4.0)
MCH: 33.3 pg (ref 26.0–34.0)
MCHC: 32.4 g/dL (ref 30.0–36.0)
MCV: 102.8 fL — ABNORMAL HIGH (ref 80.0–100.0)
Monocytes Absolute: 0.6 10*3/uL (ref 0.1–1.0)
Monocytes Relative: 9 %
Neutro Abs: 4.7 10*3/uL (ref 1.7–7.7)
Neutrophils Relative %: 73 %
Platelets: 145 10*3/uL — ABNORMAL LOW (ref 150–400)
RBC: 3.99 MIL/uL (ref 3.87–5.11)
RDW: 12.3 % (ref 11.5–15.5)
WBC: 6.4 10*3/uL (ref 4.0–10.5)
nRBC: 0.3 % — ABNORMAL HIGH (ref 0.0–0.2)

## 2023-03-28 LAB — BASIC METABOLIC PANEL
Anion gap: 9 (ref 5–15)
BUN: 12 mg/dL (ref 8–23)
CO2: 21 mmol/L — ABNORMAL LOW (ref 22–32)
Calcium: 8.5 mg/dL — ABNORMAL LOW (ref 8.9–10.3)
Chloride: 103 mmol/L (ref 98–111)
Creatinine, Ser: 0.72 mg/dL (ref 0.44–1.00)
GFR, Estimated: >60 mL/min (ref >60)
Glucose, Bld: 128 mg/dL — ABNORMAL HIGH (ref 70–99)
Potassium: 4.1 mmol/L (ref 3.5–5.1)
Sodium: 133 mmol/L — ABNORMAL LOW (ref 135–145)

## 2023-03-28 NOTE — ED Provider Notes (Signed)
[place] EMERGENCY DEPARTMENT AT [facility] Provider Note   CSN: [id number] Arrival date & time: 03/28/23  1529     History  Chief Complaint  Patient presents with   Fall   Loss of Consciousness    Nicole Keith is a 82 y.o. female.  82 year old female with prior medical history as detailed below presents for evaluation.  Patient is on active chemo for treatment of multiple myeloma.  Patient reports that yesterday morning she had a bad night of sleep.  She was standing in her kitchen when she felt "woozy" and then proceeded to have a brief syncope.  She did strike her head against the floor.  After the event yesterday morning she was able to get up and ambulate.  She reports that she has been doing well since this syncopal event yesterday morning.  She followed up with her PCP at [facility] clinic this morning.  She was advised to come to the ED for CT imaging of her head given her reported head injury.  She denies current headache, vision change, focal weakness.  She denies chest pain, shortness breath, nausea, vomiting, fever.  The history is provided by the patient and medical records.       Home Medications Prior to Admission medications   Medication Sig Start Date End Date Taking? Authorizing Provider  acetaminophen (TYLENOL) 500 MG tablet Take 1,000 mg by mouth See admin instructions. Take 1,000 mg by mouth after breakfast, at 3:30 PM, and at 8:30 PM    [provider]  acyclovir (ZOVIRAX) 400 MG tablet Take 1 tablet (400 mg total) by mouth 2 (two) times daily. Patient not taking: Reported on 01/16/2023 01/15/23   Jaci Standard, MD  albuterol (VENTOLIN HFA) 108 (90 Base) MCG/ACT inhaler SMARTSIG:1 Puff(s) By Mouth Every 4-6 Hours PRN 01/30/23   [provider]  famotidine (PEPCID) 20 MG tablet Take 1 tablet (20 mg total) by mouth daily. 01/21/23   Rhetta Mura, MD  famotidine-calcium carbonate-magnesium hydroxide (PEPCID COMPLETE) 10-800-165  MG chewable tablet Chew 1 tablet by mouth daily as needed (for heartburn or indigestion).     [provider]  guaiFENesin (MUCINEX) 600 MG 12 hr tablet Take 1 tablet (600 mg total) by mouth 2 (two) times daily. 01/20/23   Rhetta Mura, MD  loperamide (IMODIUM) 2 MG capsule Take 1 capsule (2 mg total) by mouth 3 (three) times daily between meals as needed for diarrhea or loose stools. 01/20/23   Rhetta Mura, MD  triamcinolone cream (KENALOG) 0.1 % Apply 1 Application topically 2 (two) times daily. Patient taking differently: Apply 1 Application topically 2 (two) times daily as needed (for allergic flares- affected areas). 11/20/22   Jaci Standard, MD  VASELINE PURE ULTRA WHITE ointment Apply 1 application  topically See admin instructions. Apply to the genitalia in the morning and at bedtime    [provider]      Allergies    Kyprolis [carfilzomib], Pomalyst [pomalidomide], Revlimid [lenalidomide], Betadine [povidone iodine], Flagyl [metronidazole], Fosamax [alendronate sodium], Bactrim [sulfamethoxazole-trimethoprim], Clindamycin/lincomycin, Doxepin hcl, Hydrocodone-acetaminophen, Hydroxyzine, Hydroxyzine hcl, Tramadol hcl, Valium [diazepam], Actonel [risedronate sodium], Boniva [ibandronic acid], Doxycycline, and Penicillins    Review of Systems   Review of Systems  All other systems reviewed and are negative.   Physical Exam Updated Vital Signs BP (!) 150/81   Pulse 75   Temp 98.3 F (36.8 C) (Oral)   Resp 18   SpO2 97%  Physical Exam Vitals and nursing  note reviewed.  Constitutional:      General: She is not in acute distress.    Appearance: Normal appearance. She is well-developed.  HENT:     Head: Normocephalic.     Comments: Superficial contusion noted to posterior scalp. Eyes:     Conjunctiva/sclera: Conjunctivae normal.     Pupils: Pupils are equal, round, and reactive to light.  Cardiovascular:     Rate and Rhythm: Normal rate and  regular rhythm.     Heart sounds: Normal heart sounds.  Pulmonary:     Effort: Pulmonary effort is normal. No respiratory distress.     Breath sounds: Normal breath sounds.  Abdominal:     General: There is no distension.     Palpations: Abdomen is soft.     Tenderness: There is no abdominal tenderness.  Musculoskeletal:        General: No deformity. Normal range of motion.     Cervical back: Normal range of motion and neck supple.  Skin:    General: Skin is warm and dry.  Neurological:     General: No focal deficit present.     Mental Status: She is alert and oriented to person, place, and time.     ED Results / Procedures / Treatments   Labs (all labs ordered are listed, but only abnormal results are displayed) Labs Reviewed - No data to display  EKG None  Radiology No results found.  Procedures Procedures    Medications Ordered in ED Medications - No data to display  ED Course/ Medical Decision Making/ A&P                                 Medical Decision Making Amount and/or Complexity of Data Reviewed Labs: ordered. Radiology: ordered.    Medical Screen Complete  This patient presented to the ED with complaint of head injury, reported brief syncope.  This complaint involves an extensive number of treatment options. The initial differential diagnosis includes, but is not limited to, trauma from fall, metabolic abnormality, etc.  This presentation is: Acute, Chronic, Self-Limited, Previously Undiagnosed, Uncertain Prognosis, Complicated, Systemic Symptoms, and Threat to Life/Bodily Function  Patient is currently on active chemo treatment for multiple myeloma.  Patient with reported presyncope that occurred yesterday morning.  She did strike her head.  She has been doing well since the event that occurred yesterday morning.  She was sent from her PCPs office primarily for CT imaging of her head given apparent head injury.  Imaging obtained today is  without significant acute abnormality.  Obtained screening labs are also without significant abnormality.  Patient is reassured by findings.  She is comfortable with plan for discharge home.  Importance of close follow-up was stressed.  Strict return precautions given and understood.   Additional history obtained: External records from outside sources obtained and reviewed including prior ED visits and prior Inpatient records.    Problem List / ED Course:  Fall, head injury   Reevaluation:  After the interventions noted above, I reevaluated the patient and found that they have: improved  Disposition:  After consideration of the diagnostic results and the patients response to treatment, I feel that the patent would benefit from close outpatient follow-up.          Final Clinical Impression(s) / ED Diagnoses Final diagnoses:  Injury of head, initial encounter  Fall, initial encounter    Rx / DC Orders ED  Discharge Orders     None         Wynetta Fines, MD 03/28/23 1756

## 2023-03-28 NOTE — ED Triage Notes (Signed)
Last Chemo last Thursday. She was told to come here she became dizzy and had a syncopal episode yesterday. When she fell. she hit her head and tailbone on her floor. They request an evaluation of her head due to swelling on her scalp.   Hx multiple myeloma

## 2023-03-28 NOTE — Discharge Instructions (Signed)
Return for any problem.  ?

## 2023-04-02 ENCOUNTER — Inpatient Hospital Stay: Payer: Medicare Other

## 2023-04-02 ENCOUNTER — Inpatient Hospital Stay: Payer: Medicare Other | Admitting: Hematology and Oncology

## 2023-04-02 VITALS — BP 144/71 | HR 69 | Temp 98.1°F | Resp 13 | Wt 123.8 lb

## 2023-04-02 DIAGNOSIS — Z5112 Encounter for antineoplastic immunotherapy: Secondary | ICD-10-CM | POA: Diagnosis not present

## 2023-04-02 DIAGNOSIS — C9000 Multiple myeloma not having achieved remission: Secondary | ICD-10-CM

## 2023-04-02 DIAGNOSIS — Z79899 Other long term (current) drug therapy: Secondary | ICD-10-CM | POA: Diagnosis not present

## 2023-04-02 LAB — CBC WITH DIFFERENTIAL (CANCER CENTER ONLY)
Abs Immature Granulocytes: 0.13 10*3/uL — ABNORMAL HIGH (ref 0.00–0.07)
Basophils Absolute: 0.1 10*3/uL (ref 0.0–0.1)
Basophils Relative: 1 %
Eosinophils Absolute: 0.6 10*3/uL — ABNORMAL HIGH (ref 0.0–0.5)
Eosinophils Relative: 6 %
HCT: 38.6 % (ref 36.0–46.0)
Hemoglobin: 13.1 g/dL (ref 12.0–15.0)
Immature Granulocytes: 1 %
Lymphocytes Relative: 17 %
Lymphs Abs: 1.6 10*3/uL (ref 0.7–4.0)
MCH: 34.4 pg — ABNORMAL HIGH (ref 26.0–34.0)
MCHC: 33.9 g/dL (ref 30.0–36.0)
MCV: 101.3 fL — ABNORMAL HIGH (ref 80.0–100.0)
Monocytes Absolute: 0.7 10*3/uL (ref 0.1–1.0)
Monocytes Relative: 7 %
Neutro Abs: 6.5 10*3/uL (ref 1.7–7.7)
Neutrophils Relative %: 68 %
Platelet Count: 276 10*3/uL (ref 150–400)
RBC: 3.81 MIL/uL — ABNORMAL LOW (ref 3.87–5.11)
RDW: 12.6 % (ref 11.5–15.5)
WBC Count: 9.5 10*3/uL (ref 4.0–10.5)
nRBC: 0 % (ref 0.0–0.2)

## 2023-04-02 LAB — CMP (CANCER CENTER ONLY)
ALT: 23 U/L (ref 0–44)
AST: 23 U/L (ref 15–41)
Albumin: 3.5 g/dL (ref 3.5–5.0)
Alkaline Phosphatase: 75 U/L (ref 38–126)
Anion gap: 10 (ref 5–15)
BUN: 9 mg/dL (ref 8–23)
CO2: 25 mmol/L (ref 22–32)
Calcium: 8.7 mg/dL — ABNORMAL LOW (ref 8.9–10.3)
Chloride: 104 mmol/L (ref 98–111)
Creatinine: 0.66 mg/dL (ref 0.44–1.00)
GFR, Estimated: >60 mL/min (ref >60)
Glucose, Bld: 134 mg/dL — ABNORMAL HIGH (ref 70–99)
Potassium: 3.7 mmol/L (ref 3.5–5.1)
Sodium: 139 mmol/L (ref 135–145)
Total Bilirubin: 0.7 mg/dL (ref 0.3–1.2)
Total Protein: 5.6 g/dL — ABNORMAL LOW (ref 6.5–8.1)

## 2023-04-02 MED ORDER — DEXTROSE 5 % IV SOLN
70.0000 mg/m2 | Freq: Once | INTRAVENOUS | Status: AC
  Administered 2023-04-02: 120 mg via INTRAVENOUS
  Filled 2023-04-02: qty 60

## 2023-04-02 MED ORDER — SODIUM CHLORIDE 0.9 % IV SOLN: Freq: Once | INTRAVENOUS | Status: AC

## 2023-04-02 MED ORDER — SODIUM CHLORIDE 0.9 % IV SOLN
40.0000 mg | Freq: Once | INTRAVENOUS | Status: AC
  Administered 2023-04-02: 40 mg via INTRAVENOUS
  Filled 2023-04-02: qty 4

## 2023-04-02 NOTE — Progress Notes (Signed)
[facility] [phone]   [phone]  PROGRESS NOTE  Patient Care Team: Farris Has, MD as PCP - General (Family Medicine)  CHIEF COMPLAINTS/PURPOSE OF CONSULTATION:  IgG Lambda Multiple Myeloma  ONCOLOGIC HISTORY: 07/27/2018-09/2018: Received weekly velcade plus dexamethasone. Therapy changed due to poor response.  09/2018: Started Revlimid 25 mg for 21 days on, 7 days off of a 28 day cycle with dexamethasone. Therapy was held after 2 cycles because of severe dermatological toxicity 12/2018: Pomalyst 1 mg daily for 21 days.  She completed 4 cycles of therapy and therapy was held due to occurrence of dermatological toxicity.  07/2019: Started Ninlaro 4 mg weekly with dexamethasone 20 mg weekly for 3 weeks on and 1 week off. Daratumumab was addd in April 2021. Clearnce Sorrel was discontinued in November 2021. 05/18/2020: Started monthly daratumumab and transitioned to q 3 month schedule in April 2022.  11/06/2022: Darzalex discontinued due to progression of disease. M protein 0.9 with worsening lytic lesions in the skeleton.  11/20/2022: Cycle 1 Day 1 of Kyprolis/Dex therapy.  12/19/2022: Cycle 2 Day 1 of Kyprolis/Dex therapy 01/15/2023: Cycle 3 Day 1 of Kyprolis/Dex therapy 02/19/2023: Cycle 4 Day 1 of Kyprolis/Dex therapy 03/18/2023: Cycle 5 Day 1 of Kyprolis/Dex therapy  HISTORY OF PRESENTING ILLNESS:  Nicole Keith 82 y.o. female returns for a follow up for IgG lambda multiple myeloma. She was last seen on 03/18/2023. She presents today for Cycle 5, Day 15 of Kyprolis/Dex.   On exam today, Nicole Keith reports she feels like she is "all hyped up" from the steroids she is taking.  She does tend to be tired in the evenings but sometimes lays in bed and has difficulty sleeping.  She reports that she did have an episode recently of vertigo where she felt like "the room was spinning".  She reports that she was "out of it".  She notes that otherwise she is been  tolerating the bolus therapy well with no difficulty.  It is not causing any numbness or tingling of the fingers and toes.  She denies any nausea, vomiting, or diarrhea.  Overall she would like to try to increase the number of breaks she has during therapy in order to enjoy her campsite.  We discussed the importance of therapy but she also notes that her quality of life is extremely important to her.  Given this I have agreed to delay her next cycle by 2 weeks.  She denies fevers, chills, sweats, shortness of breath, chest pain or cough. She has no other complaints. Rest of the 10 point ROS is below.  MEDICAL HISTORY:  Past Medical History:  Diagnosis Date   Allergic rhinitis    Arthritis    Colitis, collagenous    Diverticular disease    severe sig tics/fixation 2012   Family hx of colon cancer    Fracture of femoral neck, right (HCC) 01/09/2013   GERD (gastroesophageal reflux disease)    exacerbation of reflux-like symptoms 04/2011   Hiatal hernia    Hyperlipidemia    muliplt myelom 2020   Osteoarthritis of right hip 01/10/2013    SURGICAL HISTORY: Past Surgical History:  Procedure Laterality Date   CHOLECYSTECTOMY     EYE SURGERY  feb 2016   cataract right eye   LEG SURGERY Left    TONSILLECTOMY     TOTAL HIP ARTHROPLASTY Right 01/10/2013   Procedure: TOTAL HIP ARTHROPLASTY;  Surgeon: Eulas Post, MD;  Location: [facility] OR;  Service: Orthopedics;  Laterality:  Right;    SOCIAL HISTORY: Social History   Socioeconomic History   Marital status: Married    Spouse name: Not on file   Number of children: 0   Years of education: Not on file   Highest education level: Some college, no degree  Occupational History   Occupation: retired  Tobacco Use   Smoking status: Former    Current packs/day: 0.00    Types: Cigarettes    Quit date: 01/10/1972    Years since quitting: 51.2   Smokeless tobacco: Never   Tobacco comments:    quit 1973  Vaping Use   Vaping status: Never Used   Substance and Sexual Activity   Alcohol use: No   Drug use: No   Sexual activity: Not on file  Other Topics Concern   Not on file  Social History Narrative   Married, no children. Drinks 2 cups decaf coffee a day. Walks daily. Is active, plays golf, she and her husband go hiking frequently. 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   Social Determinants of Health   Financial Resource Strain: Not on file  Food Insecurity: No Food Insecurity (01/17/2023)   Hunger Vital Sign    Worried About Running Out of Food in the Last Year: Never true    Ran Out of Food in the Last Year: Never true  Transportation Needs: No Transportation Needs (01/17/2023)   PRAPARE - Administrator, Civil Service (Medical): No    Lack of Transportation (Non-Medical): No  Physical Activity: Not on file  Stress: Not on file  Social Connections: Not on file  Intimate Partner Violence: Not At Risk (01/17/2023)   Humiliation, Afraid, Rape, and Kick questionnaire    Fear of Current or Ex-Partner: No    Emotionally Abused: No    Physically Abused: No    Sexually Abused: No    FAMILY HISTORY: Family History  Problem Relation Age of Onset   Congestive Heart Failure Mother    Arthritis-Osteo Mother    Colon cancer Mother    Heart attack Father    Rheum arthritis Father    Colon cancer Maternal Aunt    Colon cancer Maternal Aunt    Hypothyroidism Sister     ALLERGIES:  is allergic to kyprolis [carfilzomib], pomalyst [pomalidomide], revlimid [lenalidomide], betadine [povidone iodine], flagyl [metronidazole], fosamax [alendronate sodium], bactrim [sulfamethoxazole-trimethoprim], clindamycin/lincomycin, doxepin hcl, hydrocodone-acetaminophen, hydroxyzine, hydroxyzine hcl, tramadol hcl, valium [diazepam], actonel [risedronate sodium], boniva [ibandronic acid], doxycycline, and penicillins.  MEDICATIONS:  Current Outpatient Medications  Medication Sig Dispense Refill   acetaminophen (TYLENOL)  500 MG tablet Take 1,000 mg by mouth See admin instructions. Take 1,000 mg by mouth after breakfast, at 3:30 PM, and at 8:30 PM     acyclovir (ZOVIRAX) 400 MG tablet Take 1 tablet (400 mg total) by mouth 2 (two) times daily. (Patient not taking: Reported on 01/16/2023) 60 tablet 3   albuterol (VENTOLIN HFA) 108 (90 Base) MCG/ACT inhaler SMARTSIG:1 Puff(s) By Mouth Every 4-6 Hours PRN     famotidine (PEPCID) 20 MG tablet Take 1 tablet (20 mg total) by mouth daily. 30 tablet 0   famotidine-calcium carbonate-magnesium hydroxide (PEPCID COMPLETE) 10-800-165 MG chewable tablet Chew 1 tablet by mouth daily as needed (for heartburn or indigestion).      guaiFENesin (MUCINEX) 600 MG 12 hr tablet Take 1 tablet (600 mg total) by mouth 2 (two) times daily. 40 tablet 0   loperamide (IMODIUM) 2 MG capsule Take 1 capsule (2 mg total)  by mouth 3 (three) times daily between meals as needed for diarrhea or loose stools. 30 capsule 1   triamcinolone cream (KENALOG) 0.1 % Apply 1 Application topically 2 (two) times daily. (Patient taking differently: Apply 1 Application topically 2 (two) times daily as needed (for allergic flares- affected areas).) 453.6 g 2   VASELINE PURE ULTRA WHITE ointment Apply 1 application  topically See admin instructions. Apply to the genitalia in the morning and at bedtime     No current facility-administered medications for this visit.    REVIEW OF SYSTEMS:   Constitutional: ( - ) fevers, ( - )  chills , ( - ) night sweats Eyes: ( - ) blurriness of vision, ( - ) double vision, ( - ) watery eyes Ears, nose, mouth, throat, and face: ( - ) mucositis, ( - ) sore throat Respiratory: ( - ) cough, ( - ) dyspnea, ( - ) wheezes Cardiovascular: ( - ) palpitation, ( - ) chest discomfort, ( - ) lower extremity swelling Gastrointestinal:  ( - ) nausea, ( - ) heartburn, ( - ) change in bowel habits Skin: ( +) abnormal skin rashes Lymphatics: ( - ) new lymphadenopathy, ( - ) easy  bruising Neurological: ( - ) numbness, ( - ) tingling, ( - ) new weaknesses Behavioral/Psych: ( - ) mood change, ( - ) new changes  All other systems were reviewed with the patient and are negative.  PHYSICAL EXAMINATION: ECOG PERFORMANCE STATUS: 1 - Symptomatic but completely ambulatory  Vitals:   04/02/23 0950  BP: (!) 144/71  Pulse: 69  Resp: 13  Temp: 98.1 F (36.7 C)  SpO2: 100%       Filed Weights   04/02/23 0950  Weight: 123 lb 12.8 oz (56.2 kg)     GENERAL: well appearing female in NAD  SKIN: skin color, texture, turgor are normal. Diffuse, flat erythematous rash.  EYES: conjunctiva are pink and non-injected, sclera clear LYMPH:  no palpable lymphadenopathy in the cervical or supraclavicular lymph nodes.  LUNGS: clear to auscultation and percussion with normal breathing effort HEART: regular rate & rhythm and no murmurs and no lower extremity edema Musculoskeletal: no cyanosis of digits and no clubbing  PSYCH: alert & oriented x 3, fluent speech NEURO: no focal motor/sensory deficits  LABORATORY DATA:  I have reviewed the data as listed    Latest Ref Rng & Units 04/02/2023    9:08 AM 03/28/2023    4:50 PM 03/26/2023    1:19 PM  CBC  WBC 4.0 - 10.5 K/uL 9.5  6.4  8.1   Hemoglobin 12.0 - 15.0 g/dL 09.8  11.9  14.7   Hematocrit 36.0 - 46.0 % 38.6  41.0  41.0   Platelets 150 - 400 K/uL 276  145  254        Latest Ref Rng & Units 04/02/2023    9:08 AM 03/28/2023    4:50 PM 03/26/2023    1:19 PM  CMP  Glucose 70 - 99 mg/dL 829  562  91   BUN 8 - 23 mg/dL 9  12  9    Creatinine 0.44 - 1.00 mg/dL 1.30  8.65  7.84   Sodium 135 - 145 mmol/L 139  133  138   Potassium 3.5 - 5.1 mmol/L 3.7  4.1  4.0   Chloride 98 - 111 mmol/L 104  103  105   CO2 22 - 32 mmol/L 25  21  28    Calcium 8.9 - 10.3  mg/dL 8.7  8.5  8.4   Total Protein 6.5 - 8.1 g/dL 5.6   5.8   Total Bilirubin 0.3 - 1.2 mg/dL 0.7   0.6   Alkaline Phos 38 - 126 U/L 75   86   AST 15 - 41 U/L 23   25    ALT 0 - 44 U/L 23   23     ASSESSMENT & PLAN DARETH ABRESCH is a 82 y.o.female who presents to the clinic for follow up for multiple myeloma.  #IgG Lambda Multiple Myeloma: --Initially presented in April 2016 with M spike of 3.2 g/dL and IgG level of 1610. Underwent bone marrow biopsy from 11/20/2014 show 29% plasma cells. 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  --In December 2019 with rising M spike of 5.9 g/dL and worsening anemia, with Hgb 8.7, she met criteria for transformation of active multiple myeloma.  --From 07/27/2018-09/2018, received weekly velcade plus dexamethasone. Therapy changed due to poor response.  --In 09/2018, started Revlimid 25 mg for 21 days on, 7 days off of a 28 day cycle with dexamethasone. Therapy was held after 2 cycles because of severe dermatological toxicity --In 12/2018, switched to Pomalyst 1 mg daily for 21 days.  She completed 4 cycles of therapy and therapy was held due to occurrence of dermatological toxicity.  --In 07/2019, switch to Ninlaro 4 mg weekly with dexamethasone 20 mg weekly for 3 weeks on and 1 week off. Daratumumab was addd in April 2021. Clearnce Sorrel was discontinued in November 2021. --On 05/18/2020, started monthly daratumumab and transitioned to q 3 month schedule in April 2022.  --On 11/20/2022, switch to Kyprolis/Dex due to rising M-protein and bone survey that showed worsening lytic lesions.  PLAN:   --Labs today show white blood cell count 9.5, hemoglobin 13.1, MCV 101.3, and platelets of 276, Creatinine is normal.  --Most recent myeloma labs from 02/11/2023 showed improvement of M protein to 0.7.  --Due Cycle 5 Day 1 of Kyprolis/Dex. Proceed without any dose modifications. Discussed that M protein needs to improve, closer to 0.2-0.3 before making any adjustments to the frequency of her treatment. I advised patient that weekly Kyprolis/Dex is standard of care.  --Underwent metastatic survey on 02/20/2023, appears to have  stable lytic lesions from prior. --RTC in 2 weeks for Cycle 5 Day 15 of Kyprolis/Dex with continued weekly treatments.   #Diffuse erythematous rash-stable --Managed with kenalog cream. -- nystatin powder for groin area.  --Discussed referral to dermatology, patient deferred at this time.   #Supportive Care -- port not required but can be placed if requested.  -- zofran 8mg  q8H PRN and compazine 10mg  PO q6H for nausea -- acyclovir 400mg  PO BID for VCZ prophylaxis -- no pain medication required at this time.  -- discussed role for zometa therapy in the setting her lytic lesions. Explained zometa is given q 12 weeks and requires a dental clearance.  After discussing this with the patient further today she knows she would like to hold on this for the time being as she is afraid of the side effects.  Strongly emphasized that she should reconsider as this will be helpful with her lytic bone lesions.  No orders of the defined types were placed in this encounter.  All questions were answered. The patient knows to call the clinic with any problems, questions or concerns.  I have spent a total of 30 minutes minutes of face-to-face and non-face-to-face time, preparing to see the patient,  performing a medically appropriate  examination, counseling and educating the patient, ordering medications/tests/procedures,documenting clinical information in the electronic health record, and care coordination.   Ulysees Barns, MD Department of Hematology/Oncology [facility] Phone: [phone] Pager: [phone] Email: [email or web address]

## 2023-04-02 NOTE — Patient Instructions (Signed)
 [place] [facility]  Discharge Instructions: Thank you for choosing [facility] to provide your oncology and hematology care.   If you have a lab appointment with the Cancer Center, please go directly to the Cancer Center and check in at the registration area.   Wear comfortable clothing and clothing appropriate for easy access to any Portacath or PICC line.   We strive to give you quality time with your provider. You may need to reschedule your appointment if you arrive late (15 or more minutes).  Arriving late affects you and other patients whose appointments are after yours.  Also, if you miss three or more appointments without notifying the office, you may be dismissed from the clinic at the provider's discretion.      For prescription refill requests, have your pharmacy contact our office and allow 72 hours for refills to be completed.    Today you received the following chemotherapy and/or immunotherapy agents kyprolis      To help prevent nausea and vomiting after your treatment, we encourage you to take your nausea medication as directed.  BELOW ARE SYMPTOMS THAT SHOULD BE REPORTED IMMEDIATELY: *FEVER GREATER THAN 100.4 F (38 C) OR HIGHER *CHILLS OR SWEATING *NAUSEA AND VOMITING THAT IS NOT CONTROLLED WITH YOUR NAUSEA MEDICATION *UNUSUAL SHORTNESS OF BREATH *UNUSUAL BRUISING OR BLEEDING *URINARY PROBLEMS (pain or burning when urinating, or frequent urination) *BOWEL PROBLEMS (unusual diarrhea, constipation, pain near the anus) TENDERNESS IN MOUTH AND THROAT WITH OR WITHOUT PRESENCE OF ULCERS (sore throat, sores in mouth, or a toothache) UNUSUAL RASH, SWELLING OR PAIN  UNUSUAL VAGINAL DISCHARGE OR ITCHING   Items with * indicate a potential emergency and should be followed up as soon as possible or go to the Emergency Department if any problems should occur.  Please show the CHEMOTHERAPY ALERT CARD or IMMUNOTHERAPY ALERT CARD at  check-in to the Emergency Department and triage nurse.  Should you have questions after your visit or need to cancel or reschedule your appointment, please contact [place] [facility]  Dept: [phone]  and follow the prompts.  Office hours are 8:00 a.m. to 4:30 p.m. Monday - Friday. Please note that voicemails left after 4:00 p.m. may not be returned until the following business day.  We are closed weekends and major holidays. You have access to a nurse at all times for urgent questions. Please call the main number to the clinic Dept: [phone] and follow the prompts.   For any non-urgent questions, you may also contact your provider using MyChart. We now offer e-Visits for anyone 41 and older to request care online for non-urgent symptoms. For details visit [email or web address].   Also download the MyChart app! Go to the app store, search "MyChart", open the app, select , and log in with your MyChart username and password.

## 2023-04-03 ENCOUNTER — Telehealth: Payer: Self-pay | Admitting: Hematology and Oncology

## 2023-04-09 ENCOUNTER — Other Ambulatory Visit: Payer: Medicare Other

## 2023-04-09 ENCOUNTER — Ambulatory Visit: Payer: Medicare Other

## 2023-04-13 ENCOUNTER — Encounter: Payer: Self-pay | Admitting: Hematology and Oncology

## 2023-04-16 ENCOUNTER — Inpatient Hospital Stay: Payer: Medicare Other

## 2023-04-23 ENCOUNTER — Encounter: Payer: Self-pay | Admitting: Hematology and Oncology

## 2023-04-24 ENCOUNTER — Inpatient Hospital Stay: Payer: Medicare Other

## 2023-04-24 ENCOUNTER — Inpatient Hospital Stay: Payer: Medicare Other | Attending: Physician Assistant | Admitting: Hematology and Oncology

## 2023-04-24 VITALS — BP 134/72 | HR 70 | Temp 98.0°F | Resp 14 | Wt 124.8 lb

## 2023-04-24 DIAGNOSIS — Z79899 Other long term (current) drug therapy: Secondary | ICD-10-CM | POA: Insufficient documentation

## 2023-04-24 DIAGNOSIS — C9000 Multiple myeloma not having achieved remission: Secondary | ICD-10-CM | POA: Diagnosis not present

## 2023-04-24 DIAGNOSIS — Z5112 Encounter for antineoplastic immunotherapy: Secondary | ICD-10-CM | POA: Insufficient documentation

## 2023-04-24 LAB — CBC WITH DIFFERENTIAL (CANCER CENTER ONLY)
Abs Immature Granulocytes: 0.01 10*3/uL (ref 0.00–0.07)
Basophils Absolute: 0.1 10*3/uL (ref 0.0–0.1)
Basophils Relative: 1 %
Eosinophils Absolute: 0.1 10*3/uL (ref 0.0–0.5)
Eosinophils Relative: 2 %
HCT: 39.1 % (ref 36.0–46.0)
Hemoglobin: 13.1 g/dL (ref 12.0–15.0)
Immature Granulocytes: 0 %
Lymphocytes Relative: 25 %
Lymphs Abs: 1.5 10*3/uL (ref 0.7–4.0)
MCH: 34.2 pg — ABNORMAL HIGH (ref 26.0–34.0)
MCHC: 33.5 g/dL (ref 30.0–36.0)
MCV: 102.1 fL — ABNORMAL HIGH (ref 80.0–100.0)
Monocytes Absolute: 0.7 10*3/uL (ref 0.1–1.0)
Monocytes Relative: 11 %
Neutro Abs: 3.7 10*3/uL (ref 1.7–7.7)
Neutrophils Relative %: 61 %
Platelet Count: 376 10*3/uL (ref 150–400)
RBC: 3.83 MIL/uL — ABNORMAL LOW (ref 3.87–5.11)
RDW: 12.2 % (ref 11.5–15.5)
WBC Count: 6.1 10*3/uL (ref 4.0–10.5)
nRBC: 0 % (ref 0.0–0.2)

## 2023-04-24 LAB — CMP (CANCER CENTER ONLY)
ALT: 16 U/L (ref 0–44)
AST: 19 U/L (ref 15–41)
Albumin: 3.8 g/dL (ref 3.5–5.0)
Alkaline Phosphatase: 70 U/L (ref 38–126)
Anion gap: 6 (ref 5–15)
BUN: 12 mg/dL (ref 8–23)
CO2: 28 mmol/L (ref 22–32)
Calcium: 9.1 mg/dL (ref 8.9–10.3)
Chloride: 106 mmol/L (ref 98–111)
Creatinine: 0.76 mg/dL (ref 0.44–1.00)
GFR, Estimated: >60 mL/min (ref >60)
Glucose, Bld: 143 mg/dL — ABNORMAL HIGH (ref 70–99)
Potassium: 4.3 mmol/L (ref 3.5–5.1)
Sodium: 140 mmol/L (ref 135–145)
Total Bilirubin: 0.8 mg/dL (ref 0.3–1.2)
Total Protein: 5.8 g/dL — ABNORMAL LOW (ref 6.5–8.1)

## 2023-04-24 MED ORDER — SODIUM CHLORIDE 0.9 % IV SOLN: Freq: Once | INTRAVENOUS | Status: AC

## 2023-04-24 MED ORDER — DEXTROSE 5 % IV SOLN
70.0000 mg/m2 | Freq: Once | INTRAVENOUS | Status: AC
  Administered 2023-04-24: 120 mg via INTRAVENOUS
  Filled 2023-04-24: qty 60

## 2023-04-24 MED ORDER — SODIUM CHLORIDE 0.9 % IV SOLN
40.0000 mg | Freq: Once | INTRAVENOUS | Status: AC
  Administered 2023-04-24: 40 mg via INTRAVENOUS
  Filled 2023-04-24: qty 4

## 2023-04-24 NOTE — Progress Notes (Unsigned)
[facility] [phone]   [phone]  PROGRESS NOTE  Patient Care Team: Farris Has, MD as PCP - General (Family Medicine)  CHIEF COMPLAINTS/PURPOSE OF CONSULTATION:  IgG Lambda Multiple Myeloma  ONCOLOGIC HISTORY: 07/27/2018-09/2018: Received weekly velcade plus dexamethasone. Therapy changed due to poor response.  09/2018: Started Revlimid 25 mg for 21 days on, 7 days off of a 28 day cycle with dexamethasone. Therapy was held after 2 cycles because of severe dermatological toxicity 12/2018: Pomalyst 1 mg daily for 21 days.  She completed 4 cycles of therapy and therapy was held due to occurrence of dermatological toxicity.  07/2019: Started Ninlaro 4 mg weekly with dexamethasone 20 mg weekly for 3 weeks on and 1 week off. Daratumumab was addd in April 2021. Clearnce Sorrel was discontinued in November 2021. 05/18/2020: Started monthly daratumumab and transitioned to q 3 month schedule in April 2022.  11/06/2022: Darzalex discontinued due to progression of disease. M protein 0.9 with worsening lytic lesions in the skeleton.  11/20/2022: Cycle 1 Day 1 of Kyprolis/Dex therapy.  12/19/2022: Cycle 2 Day 1 of Kyprolis/Dex therapy 01/15/2023: Cycle 3 Day 1 of Kyprolis/Dex therapy 02/19/2023: Cycle 4 Day 1 of Kyprolis/Dex therapy 03/18/2023: Cycle 5 Day 1 of Kyprolis/Dex therapy 04/25/2023: Cycle 6 Day 1 of Kyprolis/Dex therapy  HISTORY OF PRESENTING ILLNESS:  Nicole Keith 82 y.o. female returns for a follow up for IgG lambda multiple myeloma. She was last seen on 04/02/2023. She presents today for Cycle 6, Day 1 of Kyprolis/Dex.   On exam today, Nicole Keith reports she had an excellent labor day off and enjoyed visiting the mountains.  She reports the weather was beautiful.  She notes this morning she ate her breakfast including Cheerios with frozen cherries.  She reports that she is tolerating chemotherapy well with no major side effects.  She is not having any  numbness or tingling of her fingers and toes.  Her weight has been steady and her energy levels have been good.  Overall she is willing and able to proceed with chemotherapy today.  She denies fevers, chills, sweats, shortness of breath, chest pain or cough. She has no other complaints. Rest of the 10 point ROS is below.  MEDICAL HISTORY:  Past Medical History:  Diagnosis Date   Allergic rhinitis    Arthritis    Colitis, collagenous    Diverticular disease    severe sig tics/fixation 2012   Family hx of colon cancer    Fracture of femoral neck, right (HCC) 01/09/2013   GERD (gastroesophageal reflux disease)    exacerbation of reflux-like symptoms 04/2011   Hiatal hernia    Hyperlipidemia    muliplt myelom 2020   Osteoarthritis of right hip 01/10/2013    SURGICAL HISTORY: Past Surgical History:  Procedure Laterality Date   CHOLECYSTECTOMY     EYE SURGERY  feb 2016   cataract right eye   LEG SURGERY Left    TONSILLECTOMY     TOTAL HIP ARTHROPLASTY Right 01/10/2013   Procedure: TOTAL HIP ARTHROPLASTY;  Surgeon: Eulas Post, MD;  Location: [facility] OR;  Service: Orthopedics;  Laterality: Right;    SOCIAL HISTORY: Social History   Socioeconomic History   Marital status: Married    Spouse name: Not on file   Number of children: 0   Years of education: Not on file   Highest education level: Some college, no degree  Occupational History   Occupation: retired  Tobacco Use   Smoking status: Former  Current packs/day: 0.00    Types: Cigarettes    Quit date: 01/10/1972    Years since quitting: 51.3   Smokeless tobacco: Never   Tobacco comments:    quit 1973  Vaping Use   Vaping status: Never Used  Substance and Sexual Activity   Alcohol use: No   Drug use: No   Sexual activity: Not on file  Other Topics Concern   Not on file  Social History Narrative   Married, no children. Drinks 2 cups decaf coffee a day. Walks daily. Is active, plays golf, she and her husband go hiking  frequently. 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   Social Determinants of Health   Financial Resource Strain: Not on file  Food Insecurity: No Food Insecurity (01/17/2023)   Hunger Vital Sign    Worried About Running Out of Food in the Last Year: Never true    Ran Out of Food in the Last Year: Never true  Transportation Needs: No Transportation Needs (01/17/2023)   PRAPARE - Administrator, Civil Service (Medical): No    Lack of Transportation (Non-Medical): No  Physical Activity: Not on file  Stress: Not on file  Social Connections: Not on file  Intimate Partner Violence: Not At Risk (01/17/2023)   Humiliation, Afraid, Rape, and Kick questionnaire    Fear of Current or Ex-Partner: No    Emotionally Abused: No    Physically Abused: No    Sexually Abused: No    FAMILY HISTORY: Family History  Problem Relation Age of Onset   Congestive Heart Failure Mother    Arthritis-Osteo Mother    Colon cancer Mother    Heart attack Father    Rheum arthritis Father    Colon cancer Maternal Aunt    Colon cancer Maternal Aunt    Hypothyroidism Sister     ALLERGIES:  is allergic to kyprolis [carfilzomib], pomalyst [pomalidomide], revlimid [lenalidomide], betadine [povidone iodine], flagyl [metronidazole], fosamax [alendronate sodium], bactrim [sulfamethoxazole-trimethoprim], clindamycin/lincomycin, doxepin hcl, hydrocodone-acetaminophen, hydroxyzine, hydroxyzine hcl, tramadol hcl, valium [diazepam], actonel [risedronate sodium], boniva [ibandronic acid], doxycycline, and penicillins.  MEDICATIONS:  Current Outpatient Medications  Medication Sig Dispense Refill   acetaminophen (TYLENOL) 500 MG tablet Take 1,000 mg by mouth See admin instructions. Take 1,000 mg by mouth after breakfast, at 3:30 PM, and at 8:30 PM     acyclovir (ZOVIRAX) 400 MG tablet Take 1 tablet (400 mg total) by mouth 2 (two) times daily. (Patient not taking: Reported on 01/16/2023) 60 tablet 3    albuterol (VENTOLIN HFA) 108 (90 Base) MCG/ACT inhaler SMARTSIG:1 Puff(s) By Mouth Every 4-6 Hours PRN     famotidine (PEPCID) 20 MG tablet Take 1 tablet (20 mg total) by mouth daily. 30 tablet 0   famotidine-calcium carbonate-magnesium hydroxide (PEPCID COMPLETE) 10-800-165 MG chewable tablet Chew 1 tablet by mouth daily as needed (for heartburn or indigestion).      guaiFENesin (MUCINEX) 600 MG 12 hr tablet Take 1 tablet (600 mg total) by mouth 2 (two) times daily. 40 tablet 0   loperamide (IMODIUM) 2 MG capsule Take 1 capsule (2 mg total) by mouth 3 (three) times daily between meals as needed for diarrhea or loose stools. 30 capsule 1   triamcinolone cream (KENALOG) 0.1 % Apply 1 Application topically 2 (two) times daily. (Patient taking differently: Apply 1 Application topically 2 (two) times daily as needed (for allergic flares- affected areas).) 453.6 g 2   VASELINE PURE ULTRA WHITE ointment Apply 1 application  topically  See admin instructions. Apply to the genitalia in the morning and at bedtime     No current facility-administered medications for this visit.    REVIEW OF SYSTEMS:   Constitutional: ( - ) fevers, ( - )  chills , ( - ) night sweats Eyes: ( - ) blurriness of vision, ( - ) double vision, ( - ) watery eyes Ears, nose, mouth, throat, and face: ( - ) mucositis, ( - ) sore throat Respiratory: ( - ) cough, ( - ) dyspnea, ( - ) wheezes Cardiovascular: ( - ) palpitation, ( - ) chest discomfort, ( - ) lower extremity swelling Gastrointestinal:  ( - ) nausea, ( - ) heartburn, ( - ) change in bowel habits Skin: ( +) abnormal skin rashes Lymphatics: ( - ) new lymphadenopathy, ( - ) easy bruising Neurological: ( - ) numbness, ( - ) tingling, ( - ) new weaknesses Behavioral/Psych: ( - ) mood change, ( - ) new changes  All other systems were reviewed with the patient and are negative.  PHYSICAL EXAMINATION: ECOG PERFORMANCE STATUS: 1 - Symptomatic but completely ambulatory  Vitals:    04/24/23 1000  BP: 134/72  Pulse: 70  Resp: 14  Temp: 98 F (36.7 C)  SpO2: 98%       Filed Weights   04/24/23 1000  Weight: 124 lb 12.8 oz (56.6 kg)     GENERAL: well appearing female in NAD  SKIN: skin color, texture, turgor are normal. Diffuse, flat erythematous rash.  EYES: conjunctiva are pink and non-injected, sclera clear LYMPH:  no palpable lymphadenopathy in the cervical or supraclavicular lymph nodes.  LUNGS: clear to auscultation and percussion with normal breathing effort HEART: regular rate & rhythm and no murmurs and no lower extremity edema Musculoskeletal: no cyanosis of digits and no clubbing  PSYCH: alert & oriented x 3, fluent speech NEURO: no focal motor/sensory deficits  LABORATORY DATA:  I have reviewed the data as listed    Latest Ref Rng & Units 04/24/2023    9:26 AM 04/02/2023    9:08 AM 03/28/2023    4:50 PM  CBC  WBC 4.0 - 10.5 K/uL 6.1  9.5  6.4   Hemoglobin 12.0 - 15.0 g/dL 96.2  95.2  84.1   Hematocrit 36.0 - 46.0 % 39.1  38.6  41.0   Platelets 150 - 400 K/uL 376  276  145        Latest Ref Rng & Units 04/24/2023    9:26 AM 04/02/2023    9:08 AM 03/28/2023    4:50 PM  CMP  Glucose 70 - 99 mg/dL 324  401  027   BUN 8 - 23 mg/dL 12  9  12    Creatinine 0.44 - 1.00 mg/dL 2.53  6.64  4.03   Sodium 135 - 145 mmol/L 140  139  133   Potassium 3.5 - 5.1 mmol/L 4.3  3.7  4.1   Chloride 98 - 111 mmol/L 106  104  103   CO2 22 - 32 mmol/L 28  25  21    Calcium 8.9 - 10.3 mg/dL 9.1  8.7  8.5   Total Protein 6.5 - 8.1 g/dL 5.8  5.6    Total Bilirubin 0.3 - 1.2 mg/dL 0.8  0.7    Alkaline Phos 38 - 126 U/L 70  75    AST 15 - 41 U/L 19  23    ALT 0 - 44 U/L 16  23  ASSESSMENT & PLAN Nicole Keith is a 81 y.o.female who presents to the clinic for follow up for multiple myeloma.  #IgG Lambda Multiple Myeloma: --Initially presented in April 2016 with M spike of 3.2 g/dL and IgG level of 4098. Underwent bone marrow biopsy from 11/20/2014  show 29% plasma cells. 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  --In December 2019 with rising M spike of 5.9 g/dL and worsening anemia, with Hgb 8.7, she met criteria for transformation of active multiple myeloma.  --From 07/27/2018-09/2018, received weekly velcade plus dexamethasone. Therapy changed due to poor response.  --In 09/2018, started Revlimid 25 mg for 21 days on, 7 days off of a 28 day cycle with dexamethasone. Therapy was held after 2 cycles because of severe dermatological toxicity --In 12/2018, switched to Pomalyst 1 mg daily for 21 days.  She completed 4 cycles of therapy and therapy was held due to occurrence of dermatological toxicity.  --In 07/2019, switch to Ninlaro 4 mg weekly with dexamethasone 20 mg weekly for 3 weeks on and 1 week off. Daratumumab was addd in April 2021. Clearnce Sorrel was discontinued in November 2021. --On 05/18/2020, started monthly daratumumab and transitioned to q 3 month schedule in April 2022.  --On 11/20/2022, switch to Kyprolis/Dex due to rising M-protein and bone survey that showed worsening lytic lesions.  PLAN:   --Labs today show white blood cell count 6.1, Hgb 13.1, MCV 102.1, Plt 376, Creatinine and LFTs are normal.  --Most recent myeloma labs from 02/11/2023 showed improvement of M protein to 0.5.  --Due for Cycle 6 Day 1 of Kyprolis/Dex. Proceed without any dose modifications. Discussed that M protein needs to improve, closer to 0.2-0.3 before making any adjustments to the frequency of her treatment. I advised patient that weekly Kyprolis/Dex is standard of care.  --Underwent metastatic survey on 02/20/2023, appears to have stable lytic lesions from prior. --RTC in 2 weeks for Cycle 6 Day 15 of Kyprolis/Dex with continued weekly treatments.   #Diffuse erythematous rash-stable --Managed with kenalog cream. -- nystatin powder for groin area.  --Discussed referral to dermatology, patient deferred at this time.   #Supportive Care --  port not required but can be placed if requested.  -- zofran 8mg  q8H PRN and compazine 10mg  PO q6H for nausea -- acyclovir 400mg  PO BID for VCZ prophylaxis -- no pain medication required at this time.  -- discussed role for zometa therapy in the setting her lytic lesions. Explained zometa is given q 12 weeks and requires a dental clearance.  After discussing this with the patient further today she knows she would like to hold on this for the time being as she is afraid of the side effects.  Strongly emphasized that she should reconsider as this will be helpful with her lytic bone lesions.  No orders of the defined types were placed in this encounter.  All questions were answered. The patient knows to call the clinic with any problems, questions or concerns.  I have spent a total of 30 minutes minutes of face-to-face and non-face-to-face time, preparing to see the patient,  performing a medically appropriate examination, counseling and educating the patient, ordering medications/tests/procedures,documenting clinical information in the electronic health record, and care coordination.   Ulysees Barns, MD Department of Hematology/Oncology [facility] at [facility] Phone: [phone] Pager: [phone] Email: [email or web address]

## 2023-04-24 NOTE — Patient Instructions (Signed)
[place] [facility]  Discharge Instructions: Thank you for choosing [facility] to provide your oncology and hematology care.   If you have a lab appointment with the Cancer Center, please go directly to the Cancer Center and check in at the registration area.   Wear comfortable clothing and clothing appropriate for easy access to any Portacath or PICC line.   We strive to give you quality time with your provider. You may need to reschedule your appointment if you arrive late (15 or more minutes).  Arriving late affects you and other patients whose appointments are after yours.  Also, if you miss three or more appointments without notifying the office, you may be dismissed from the clinic at the provider's discretion.      For prescription refill requests, have your pharmacy contact our office and allow 72 hours for refills to be completed.    Today you received the following chemotherapy and/or immunotherapy agents Kyprolis      To help prevent nausea and vomiting after your treatment, we encourage you to take your nausea medication as directed.  BELOW ARE SYMPTOMS THAT SHOULD BE REPORTED IMMEDIATELY: *FEVER GREATER THAN 100.4 F (38 C) OR HIGHER *CHILLS OR SWEATING *NAUSEA AND VOMITING THAT IS NOT CONTROLLED WITH YOUR NAUSEA MEDICATION *UNUSUAL SHORTNESS OF BREATH *UNUSUAL BRUISING OR BLEEDING *URINARY PROBLEMS (pain or burning when urinating, or frequent urination) *BOWEL PROBLEMS (unusual diarrhea, constipation, pain near the anus) TENDERNESS IN MOUTH AND THROAT WITH OR WITHOUT PRESENCE OF ULCERS (sore throat, sores in mouth, or a toothache) UNUSUAL RASH, SWELLING OR PAIN  UNUSUAL VAGINAL DISCHARGE OR ITCHING   Items with * indicate a potential emergency and should be followed up as soon as possible or go to the Emergency Department if any problems should occur.  Please show the CHEMOTHERAPY ALERT CARD or IMMUNOTHERAPY ALERT CARD at  check-in to the Emergency Department and triage nurse.  Should you have questions after your visit or need to cancel or reschedule your appointment, please contact [place] [facility]  Dept: [phone]  and follow the prompts.  Office hours are 8:00 a.m. to 4:30 p.m. Monday - Friday. Please note that voicemails left after 4:00 p.m. may not be returned until the following business day.  We are closed weekends and major holidays. You have access to a nurse at all times for urgent questions. Please call the main number to the clinic Dept: [phone] and follow the prompts.   For any non-urgent questions, you may also contact your provider using MyChart. We now offer e-Visits for anyone 53 and older to request care online for non-urgent symptoms. For details visit [email or web address].   Also download the MyChart app! Go to the app store, search "MyChart", open the app, select [place], and log in with your MyChart username and password.

## 2023-04-25 ENCOUNTER — Encounter: Payer: Self-pay | Admitting: Hematology and Oncology

## 2023-04-27 ENCOUNTER — Ambulatory Visit: Payer: Medicare Other | Admitting: Pulmonary Disease

## 2023-04-27 LAB — KAPPA/LAMBDA LIGHT CHAINS
Kappa free light chain: 1.7 mg/L — ABNORMAL LOW (ref 3.3–19.4)
Kappa, lambda light chain ratio: 0.17 — ABNORMAL LOW (ref 0.26–1.65)
Lambda free light chains: 9.9 mg/L (ref 5.7–26.3)

## 2023-04-28 ENCOUNTER — Other Ambulatory Visit: Payer: Self-pay

## 2023-04-30 ENCOUNTER — Inpatient Hospital Stay: Payer: Medicare Other

## 2023-04-30 VITALS — BP 130/75 | HR 65 | Temp 98.1°F | Resp 18

## 2023-04-30 DIAGNOSIS — Z5112 Encounter for antineoplastic immunotherapy: Secondary | ICD-10-CM | POA: Diagnosis not present

## 2023-04-30 DIAGNOSIS — C9000 Multiple myeloma not having achieved remission: Secondary | ICD-10-CM

## 2023-04-30 DIAGNOSIS — Z79899 Other long term (current) drug therapy: Secondary | ICD-10-CM | POA: Diagnosis not present

## 2023-04-30 LAB — MULTIPLE MYELOMA PANEL, SERUM
Albumin SerPl Elph-Mcnc: 3.4 g/dL (ref 2.9–4.4)
Albumin/Glob SerPl: 1.7 (ref 0.7–1.7)
Alpha 1: 0.3 g/dL (ref 0.0–0.4)
Alpha2 Glob SerPl Elph-Mcnc: 0.5 g/dL (ref 0.4–1.0)
B-Globulin SerPl Elph-Mcnc: 0.8 g/dL (ref 0.7–1.3)
Gamma Glob SerPl Elph-Mcnc: 0.6 g/dL (ref 0.4–1.8)
Globulin, Total: 2.1 g/dL — ABNORMAL LOW (ref 2.2–3.9)
IgA: 12 mg/dL — ABNORMAL LOW (ref 64–422)
IgG (Immunoglobin G), Serum: 628 mg/dL (ref 586–1602)
IgM (Immunoglobulin M), Srm: 10 mg/dL — ABNORMAL LOW (ref 26–217)
M Protein SerPl Elph-Mcnc: 0.5 g/dL — ABNORMAL HIGH
Total Protein ELP: 5.5 g/dL — ABNORMAL LOW (ref 6.0–8.5)

## 2023-04-30 LAB — CBC WITH DIFFERENTIAL (CANCER CENTER ONLY)
Abs Immature Granulocytes: 0.13 10*3/uL — ABNORMAL HIGH (ref 0.00–0.07)
Basophils Absolute: 0.1 10*3/uL (ref 0.0–0.1)
Basophils Relative: 1 %
Eosinophils Absolute: 0.5 10*3/uL (ref 0.0–0.5)
Eosinophils Relative: 5 %
HCT: 37.2 % (ref 36.0–46.0)
Hemoglobin: 12.8 g/dL (ref 12.0–15.0)
Immature Granulocytes: 1 %
Lymphocytes Relative: 19 %
Lymphs Abs: 2.1 10*3/uL (ref 0.7–4.0)
MCH: 34.5 pg — ABNORMAL HIGH (ref 26.0–34.0)
MCHC: 34.4 g/dL (ref 30.0–36.0)
MCV: 100.3 fL — ABNORMAL HIGH (ref 80.0–100.0)
Monocytes Absolute: 1.1 10*3/uL — ABNORMAL HIGH (ref 0.1–1.0)
Monocytes Relative: 10 %
Neutro Abs: 6.8 10*3/uL (ref 1.7–7.7)
Neutrophils Relative %: 64 %
Platelet Count: 158 10*3/uL (ref 150–400)
RBC: 3.71 MIL/uL — ABNORMAL LOW (ref 3.87–5.11)
RDW: 12.4 % (ref 11.5–15.5)
WBC Count: 10.7 10*3/uL — ABNORMAL HIGH (ref 4.0–10.5)
nRBC: 0 % (ref 0.0–0.2)

## 2023-04-30 LAB — CMP (CANCER CENTER ONLY)
ALT: 17 U/L (ref 0–44)
AST: 18 U/L (ref 15–41)
Albumin: 3.9 g/dL (ref 3.5–5.0)
Alkaline Phosphatase: 70 U/L (ref 38–126)
Anion gap: 5 (ref 5–15)
BUN: 13 mg/dL (ref 8–23)
CO2: 29 mmol/L (ref 22–32)
Calcium: 9.5 mg/dL (ref 8.9–10.3)
Chloride: 105 mmol/L (ref 98–111)
Creatinine: 0.72 mg/dL (ref 0.44–1.00)
GFR, Estimated: >60 mL/min (ref >60)
Glucose, Bld: 92 mg/dL (ref 70–99)
Potassium: 4.2 mmol/L (ref 3.5–5.1)
Sodium: 139 mmol/L (ref 135–145)
Total Bilirubin: 0.7 mg/dL (ref 0.3–1.2)
Total Protein: 6 g/dL — ABNORMAL LOW (ref 6.5–8.1)

## 2023-04-30 MED ORDER — SODIUM CHLORIDE 0.9% FLUSH: 10.0000 mL | INTRAVENOUS | Status: DC | PRN

## 2023-04-30 MED ORDER — DEXTROSE 5 % IV SOLN
70.0000 mg/m2 | Freq: Once | INTRAVENOUS | Status: AC
  Administered 2023-04-30: 120 mg via INTRAVENOUS
  Filled 2023-04-30: qty 60

## 2023-04-30 MED ORDER — SODIUM CHLORIDE 0.9 % IV SOLN: Freq: Once | INTRAVENOUS | Status: AC

## 2023-04-30 MED ORDER — HEPARIN SOD (PORK) LOCK FLUSH 100 UNIT/ML IV SOLN: 500.0000 [IU] | Freq: Once | INTRAVENOUS | Status: DC | PRN

## 2023-04-30 MED ORDER — SODIUM CHLORIDE 0.9 % IV SOLN
40.0000 mg | Freq: Once | INTRAVENOUS | Status: AC
  Administered 2023-04-30: 40 mg via INTRAVENOUS
  Filled 2023-04-30: qty 4

## 2023-04-30 NOTE — Patient Instructions (Signed)
[place] [facility]  Discharge Instructions: Thank you for choosing [facility] to provide your oncology and hematology care.   If you have a lab appointment with the Cancer Center, please go directly to the Cancer Center and check in at the registration area.   Wear comfortable clothing and clothing appropriate for easy access to any Portacath or PICC line.   We strive to give you quality time with your provider. You may need to reschedule your appointment if you arrive late (15 or more minutes).  Arriving late affects you and other patients whose appointments are after yours.  Also, if you miss three or more appointments without notifying the office, you may be dismissed from the clinic at the provider's discretion.      For prescription refill requests, have your pharmacy contact our office and allow 72 hours for refills to be completed.    Today you received the following chemotherapy and/or immunotherapy agents Carfilzomib      To help prevent nausea and vomiting after your treatment, we encourage you to take your nausea medication as directed.  BELOW ARE SYMPTOMS THAT SHOULD BE REPORTED IMMEDIATELY: *FEVER GREATER THAN 100.4 F (38 C) OR HIGHER *CHILLS OR SWEATING *NAUSEA AND VOMITING THAT IS NOT CONTROLLED WITH YOUR NAUSEA MEDICATION *UNUSUAL SHORTNESS OF BREATH *UNUSUAL BRUISING OR BLEEDING *URINARY PROBLEMS (pain or burning when urinating, or frequent urination) *BOWEL PROBLEMS (unusual diarrhea, constipation, pain near the anus) TENDERNESS IN MOUTH AND THROAT WITH OR WITHOUT PRESENCE OF ULCERS (sore throat, sores in mouth, or a toothache) UNUSUAL RASH, SWELLING OR PAIN  UNUSUAL VAGINAL DISCHARGE OR ITCHING   Items with * indicate a potential emergency and should be followed up as soon as possible or go to the Emergency Department if any problems should occur.  Please show the CHEMOTHERAPY ALERT CARD or IMMUNOTHERAPY ALERT CARD at  check-in to the Emergency Department and triage nurse.  Should you have questions after your visit or need to cancel or reschedule your appointment, please contact [place] [facility]  Dept: [phone]  and follow the prompts.  Office hours are 8:00 a.m. to 4:30 p.m. Monday - Friday. Please note that voicemails left after 4:00 p.m. may not be returned until the following business day.  We are closed weekends and major holidays. You have access to a nurse at all times for urgent questions. Please call the main number to the clinic Dept: [phone] and follow the prompts.   For any non-urgent questions, you may also contact your provider using MyChart. We now offer e-Visits for anyone 68 and older to request care online for non-urgent symptoms. For details visit [email or web address].   Also download the MyChart app! Go to the app store, search "MyChart", open the app, select [place], and log in with your MyChart username and password.

## 2023-05-02 ENCOUNTER — Other Ambulatory Visit: Payer: Self-pay

## 2023-05-12 ENCOUNTER — Encounter: Payer: Self-pay | Admitting: Hematology and Oncology

## 2023-05-14 ENCOUNTER — Inpatient Hospital Stay: Payer: Medicare Other

## 2023-05-14 ENCOUNTER — Inpatient Hospital Stay: Payer: Medicare Other | Attending: Physician Assistant | Admitting: Hematology and Oncology

## 2023-05-14 VITALS — BP 133/62 | HR 63 | Temp 98.3°F | Resp 17

## 2023-05-14 DIAGNOSIS — Z5112 Encounter for antineoplastic immunotherapy: Secondary | ICD-10-CM | POA: Insufficient documentation

## 2023-05-14 DIAGNOSIS — C9000 Multiple myeloma not having achieved remission: Secondary | ICD-10-CM | POA: Diagnosis not present

## 2023-05-14 DIAGNOSIS — Z79899 Other long term (current) drug therapy: Secondary | ICD-10-CM | POA: Insufficient documentation

## 2023-05-14 LAB — CBC WITH DIFFERENTIAL (CANCER CENTER ONLY)
Abs Immature Granulocytes: 0.02 10*3/uL (ref 0.00–0.07)
Basophils Absolute: 0.1 10*3/uL (ref 0.0–0.1)
Basophils Relative: 1 %
Eosinophils Absolute: 0.1 10*3/uL (ref 0.0–0.5)
Eosinophils Relative: 2 %
HCT: 39.2 % (ref 36.0–46.0)
Hemoglobin: 12.8 g/dL (ref 12.0–15.0)
Immature Granulocytes: 0 %
Lymphocytes Relative: 19 %
Lymphs Abs: 1.2 10*3/uL (ref 0.7–4.0)
MCH: 33.2 pg (ref 26.0–34.0)
MCHC: 32.7 g/dL (ref 30.0–36.0)
MCV: 101.6 fL — ABNORMAL HIGH (ref 80.0–100.0)
Monocytes Absolute: 0.5 10*3/uL (ref 0.1–1.0)
Monocytes Relative: 9 %
Neutro Abs: 4.4 10*3/uL (ref 1.7–7.7)
Neutrophils Relative %: 69 %
Platelet Count: 613 10*3/uL — ABNORMAL HIGH (ref 150–400)
RBC: 3.86 MIL/uL — ABNORMAL LOW (ref 3.87–5.11)
RDW: 12.6 % (ref 11.5–15.5)
WBC Count: 6.3 10*3/uL (ref 4.0–10.5)
nRBC: 0 % (ref 0.0–0.2)

## 2023-05-14 LAB — CMP (CANCER CENTER ONLY)
ALT: 17 U/L (ref 0–44)
AST: 20 U/L (ref 15–41)
Albumin: 3.7 g/dL (ref 3.5–5.0)
Alkaline Phosphatase: 62 U/L (ref 38–126)
Anion gap: 6 (ref 5–15)
BUN: 9 mg/dL (ref 8–23)
CO2: 27 mmol/L (ref 22–32)
Calcium: 9 mg/dL (ref 8.9–10.3)
Chloride: 107 mmol/L (ref 98–111)
Creatinine: 0.73 mg/dL (ref 0.44–1.00)
GFR, Estimated: >60 mL/min (ref >60)
Glucose, Bld: 140 mg/dL — ABNORMAL HIGH (ref 70–99)
Potassium: 4 mmol/L (ref 3.5–5.1)
Sodium: 140 mmol/L (ref 135–145)
Total Bilirubin: 0.6 mg/dL (ref 0.3–1.2)
Total Protein: 5.7 g/dL — ABNORMAL LOW (ref 6.5–8.1)

## 2023-05-14 MED ORDER — DEXTROSE 5 % IV SOLN
70.0000 mg/m2 | Freq: Once | INTRAVENOUS | Status: AC
  Administered 2023-05-14: 120 mg via INTRAVENOUS
  Filled 2023-05-14: qty 60

## 2023-05-14 MED ORDER — SODIUM CHLORIDE 0.9 % IV SOLN: Freq: Once | INTRAVENOUS | Status: AC

## 2023-05-14 MED ORDER — SODIUM CHLORIDE 0.9 % IV SOLN
40.0000 mg | Freq: Once | INTRAVENOUS | Status: AC
  Administered 2023-05-14: 40 mg via INTRAVENOUS
  Filled 2023-05-14: qty 4

## 2023-05-14 NOTE — Patient Instructions (Signed)
[place] [facility]  Discharge Instructions: Thank you for choosing [facility] to provide your oncology and hematology care.   If you have a lab appointment with the Cancer Center, please go directly to the Cancer Center and check in at the registration area.   Wear comfortable clothing and clothing appropriate for easy access to any Portacath or PICC line.   We strive to give you quality time with your provider. You may need to reschedule your appointment if you arrive late (15 or more minutes).  Arriving late affects you and other patients whose appointments are after yours.  Also, if you miss three or more appointments without notifying the office, you may be dismissed from the clinic at the provider's discretion.      For prescription refill requests, have your pharmacy contact our office and allow 72 hours for refills to be completed.    Today you received the following chemotherapy and/or immunotherapy agents Carfilzomib      To help prevent nausea and vomiting after your treatment, we encourage you to take your nausea medication as directed.  BELOW ARE SYMPTOMS THAT SHOULD BE REPORTED IMMEDIATELY: *FEVER GREATER THAN 100.4 F (38 C) OR HIGHER *CHILLS OR SWEATING *NAUSEA AND VOMITING THAT IS NOT CONTROLLED WITH YOUR NAUSEA MEDICATION *UNUSUAL SHORTNESS OF BREATH *UNUSUAL BRUISING OR BLEEDING *URINARY PROBLEMS (pain or burning when urinating, or frequent urination) *BOWEL PROBLEMS (unusual diarrhea, constipation, pain near the anus) TENDERNESS IN MOUTH AND THROAT WITH OR WITHOUT PRESENCE OF ULCERS (sore throat, sores in mouth, or a toothache) UNUSUAL RASH, SWELLING OR PAIN  UNUSUAL VAGINAL DISCHARGE OR ITCHING   Items with * indicate a potential emergency and should be followed up as soon as possible or go to the Emergency Department if any problems should occur.  Please show the CHEMOTHERAPY ALERT CARD or IMMUNOTHERAPY ALERT CARD at  check-in to the Emergency Department and triage nurse.  Should you have questions after your visit or need to cancel or reschedule your appointment, please contact [place] [facility]  Dept: [phone]  and follow the prompts.  Office hours are 8:00 a.m. to 4:30 p.m. Monday - Friday. Please note that voicemails left after 4:00 p.m. may not be returned until the following business day.  We are closed weekends and major holidays. You have access to a nurse at all times for urgent questions. Please call the main number to the clinic Dept: [phone] and follow the prompts.   For any non-urgent questions, you may also contact your provider using MyChart. We now offer e-Visits for anyone 18 and older to request care online for non-urgent symptoms. For details visit [email or web address].   Also download the MyChart app! Go to the app store, search "MyChart", open the app, select [place], and log in with your MyChart username and password.   
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
## 2023-05-15 ENCOUNTER — Other Ambulatory Visit: Payer: Self-pay

## 2023-05-20 ENCOUNTER — Other Ambulatory Visit: Payer: Self-pay

## 2023-05-20 DIAGNOSIS — C9000 Multiple myeloma not having achieved remission: Secondary | ICD-10-CM

## 2023-05-21 ENCOUNTER — Inpatient Hospital Stay: Payer: Medicare Other

## 2023-05-21 VITALS — BP 154/72 | HR 66 | Temp 98.3°F | Resp 18 | Wt 125.0 lb

## 2023-05-21 DIAGNOSIS — C9000 Multiple myeloma not having achieved remission: Secondary | ICD-10-CM

## 2023-05-21 DIAGNOSIS — Z79899 Other long term (current) drug therapy: Secondary | ICD-10-CM | POA: Diagnosis not present

## 2023-05-21 DIAGNOSIS — Z5112 Encounter for antineoplastic immunotherapy: Secondary | ICD-10-CM | POA: Diagnosis not present

## 2023-05-21 LAB — CBC WITH DIFFERENTIAL (CANCER CENTER ONLY)
Abs Immature Granulocytes: 0.08 10*3/uL — ABNORMAL HIGH (ref 0.00–0.07)
Basophils Absolute: 0 10*3/uL (ref 0.0–0.1)
Basophils Relative: 1 %
Eosinophils Absolute: 0.6 10*3/uL — ABNORMAL HIGH (ref 0.0–0.5)
Eosinophils Relative: 8 %
HCT: 38.8 % (ref 36.0–46.0)
Hemoglobin: 12.9 g/dL (ref 12.0–15.0)
Immature Granulocytes: 1 %
Lymphocytes Relative: 24 %
Lymphs Abs: 1.8 10*3/uL (ref 0.7–4.0)
MCH: 33.3 pg (ref 26.0–34.0)
MCHC: 33.2 g/dL (ref 30.0–36.0)
MCV: 100.3 fL — ABNORMAL HIGH (ref 80.0–100.0)
Monocytes Absolute: 0.9 10*3/uL (ref 0.1–1.0)
Monocytes Relative: 12 %
Neutro Abs: 4.4 10*3/uL (ref 1.7–7.7)
Neutrophils Relative %: 54 %
Platelet Count: 202 10*3/uL (ref 150–400)
RBC: 3.87 MIL/uL (ref 3.87–5.11)
RDW: 12.8 % (ref 11.5–15.5)
WBC Count: 7.8 10*3/uL (ref 4.0–10.5)
nRBC: 0 % (ref 0.0–0.2)

## 2023-05-21 LAB — CMP (CANCER CENTER ONLY)
ALT: 14 U/L (ref 0–44)
AST: 18 U/L (ref 15–41)
Albumin: 4 g/dL (ref 3.5–5.0)
Alkaline Phosphatase: 57 U/L (ref 38–126)
Anion gap: 6 (ref 5–15)
BUN: 11 mg/dL (ref 8–23)
CO2: 27 mmol/L (ref 22–32)
Calcium: 9.1 mg/dL (ref 8.9–10.3)
Chloride: 105 mmol/L (ref 98–111)
Creatinine: 0.67 mg/dL (ref 0.44–1.00)
GFR, Estimated: >60 mL/min (ref >60)
Glucose, Bld: 84 mg/dL (ref 70–99)
Potassium: 4.2 mmol/L (ref 3.5–5.1)
Sodium: 138 mmol/L (ref 135–145)
Total Bilirubin: 0.7 mg/dL (ref 0.3–1.2)
Total Protein: 6.1 g/dL — ABNORMAL LOW (ref 6.5–8.1)

## 2023-05-21 MED ORDER — SODIUM CHLORIDE 0.9 % IV SOLN: Freq: Once | INTRAVENOUS | Status: AC

## 2023-05-21 MED ORDER — SODIUM CHLORIDE 0.9 % IV SOLN: Freq: Once | INTRAVENOUS | Status: DC

## 2023-05-21 MED ORDER — DEXTROSE 5 % IV SOLN
70.0000 mg/m2 | Freq: Once | INTRAVENOUS | Status: AC
  Administered 2023-05-21: 120 mg via INTRAVENOUS
  Filled 2023-05-21: qty 60

## 2023-05-21 MED ORDER — SODIUM CHLORIDE 0.9% FLUSH: 10.0000 mL | INTRAVENOUS | Status: DC | PRN

## 2023-05-21 MED ORDER — SODIUM CHLORIDE 0.9 % IV SOLN
40.0000 mg | Freq: Once | INTRAVENOUS | Status: AC
  Administered 2023-05-21: 40 mg via INTRAVENOUS
  Filled 2023-05-21: qty 4

## 2023-05-21 NOTE — Patient Instructions (Signed)
[place] [facility]  Discharge Instructions: Thank you for choosing [facility] to provide your oncology and hematology care.   If you have a lab appointment with the Cancer Center, please go directly to the Cancer Center and check in at the registration area.   Wear comfortable clothing and clothing appropriate for easy access to any Portacath or PICC line.   We strive to give you quality time with your provider. You may need to reschedule your appointment if you arrive late (15 or more minutes).  Arriving late affects you and other patients whose appointments are after yours.  Also, if you miss three or more appointments without notifying the office, you may be dismissed from the clinic at the provider's discretion.      For prescription refill requests, have your pharmacy contact our office and allow 72 hours for refills to be completed.    Today you received the following chemotherapy and/or immunotherapy agents Carfilzomib      To help prevent nausea and vomiting after your treatment, we encourage you to take your nausea medication as directed.  BELOW ARE SYMPTOMS THAT SHOULD BE REPORTED IMMEDIATELY: *FEVER GREATER THAN 100.4 F (38 C) OR HIGHER *CHILLS OR SWEATING *NAUSEA AND VOMITING THAT IS NOT CONTROLLED WITH YOUR NAUSEA MEDICATION *UNUSUAL SHORTNESS OF BREATH *UNUSUAL BRUISING OR BLEEDING *URINARY PROBLEMS (pain or burning when urinating, or frequent urination) *BOWEL PROBLEMS (unusual diarrhea, constipation, pain near the anus) TENDERNESS IN MOUTH AND THROAT WITH OR WITHOUT PRESENCE OF ULCERS (sore throat, sores in mouth, or a toothache) UNUSUAL RASH, SWELLING OR PAIN  UNUSUAL VAGINAL DISCHARGE OR ITCHING   Items with * indicate a potential emergency and should be followed up as soon as possible or go to the Emergency Department if any problems should occur.  Please show the CHEMOTHERAPY ALERT CARD or IMMUNOTHERAPY ALERT CARD at  check-in to the Emergency Department and triage nurse.  Should you have questions after your visit or need to cancel or reschedule your appointment, please contact [place] [facility]  Dept: [phone]  and follow the prompts.  Office hours are 8:00 a.m. to 4:30 p.m. Monday - Friday. Please note that voicemails left after 4:00 p.m. may not be returned until the following business day.  We are closed weekends and major holidays. You have access to a nurse at all times for urgent questions. Please call the main number to the clinic Dept: [phone] and follow the prompts.   For any non-urgent questions, you may also contact your provider using MyChart. We now offer e-Visits for anyone 18 and older to request care online for non-urgent symptoms. For details visit [email or web address].   Also download the MyChart app! Go to the app store, search "MyChart", open the app, select [place], and log in with your MyChart username and password.   

## 2023-05-22 LAB — KAPPA/LAMBDA LIGHT CHAINS
Kappa free light chain: 1.3 mg/L — ABNORMAL LOW (ref 3.3–19.4)
Kappa, lambda light chain ratio: 0.2 — ABNORMAL LOW (ref 0.26–1.65)
Lambda free light chains: 6.4 mg/L (ref 5.7–26.3)

## 2023-05-25 ENCOUNTER — Encounter: Payer: Self-pay | Admitting: Hematology and Oncology

## 2023-05-25 LAB — MULTIPLE MYELOMA PANEL, SERUM
Albumin SerPl Elph-Mcnc: 3.7 g/dL (ref 2.9–4.4)
Albumin/Glob SerPl: 1.9 — ABNORMAL HIGH (ref 0.7–1.7)
Alpha 1: 0.2 g/dL (ref 0.0–0.4)
Alpha2 Glob SerPl Elph-Mcnc: 0.5 g/dL (ref 0.4–1.0)
B-Globulin SerPl Elph-Mcnc: 0.8 g/dL (ref 0.7–1.3)
Gamma Glob SerPl Elph-Mcnc: 0.5 g/dL (ref 0.4–1.8)
Globulin, Total: 2 g/dL — ABNORMAL LOW (ref 2.2–3.9)
IgA: 10 mg/dL — ABNORMAL LOW (ref 64–422)
IgG (Immunoglobin G), Serum: 558 mg/dL — ABNORMAL LOW (ref 586–1602)
IgM (Immunoglobulin M), Srm: 9 mg/dL — ABNORMAL LOW (ref 26–217)
M Protein SerPl Elph-Mcnc: 0.3 g/dL — ABNORMAL HIGH
Total Protein ELP: 5.7 g/dL — ABNORMAL LOW (ref 6.0–8.5)

## 2023-05-28 ENCOUNTER — Ambulatory Visit: Payer: Medicare Other | Admitting: Hematology and Oncology

## 2023-05-28 ENCOUNTER — Inpatient Hospital Stay: Payer: Medicare Other

## 2023-05-28 ENCOUNTER — Ambulatory Visit: Payer: Medicare Other

## 2023-05-28 ENCOUNTER — Other Ambulatory Visit: Payer: Medicare Other

## 2023-05-28 VITALS — BP 141/70 | HR 65 | Temp 98.4°F | Resp 16 | Ht 66.0 in | Wt 125.0 lb

## 2023-05-28 DIAGNOSIS — Z5112 Encounter for antineoplastic immunotherapy: Secondary | ICD-10-CM | POA: Diagnosis not present

## 2023-05-28 DIAGNOSIS — C9000 Multiple myeloma not having achieved remission: Secondary | ICD-10-CM | POA: Diagnosis not present

## 2023-05-28 DIAGNOSIS — Z79899 Other long term (current) drug therapy: Secondary | ICD-10-CM | POA: Diagnosis not present

## 2023-05-28 LAB — CMP (CANCER CENTER ONLY)
ALT: 13 U/L (ref 0–44)
AST: 15 U/L (ref 15–41)
Albumin: 3.6 g/dL (ref 3.5–5.0)
Alkaline Phosphatase: 51 U/L (ref 38–126)
Anion gap: 5 (ref 5–15)
BUN: 11 mg/dL (ref 8–23)
CO2: 28 mmol/L (ref 22–32)
Calcium: 9.2 mg/dL (ref 8.9–10.3)
Chloride: 105 mmol/L (ref 98–111)
Creatinine: 0.69 mg/dL (ref 0.44–1.00)
GFR, Estimated: >60 mL/min (ref >60)
Glucose, Bld: 154 mg/dL — ABNORMAL HIGH (ref 70–99)
Potassium: 4.4 mmol/L (ref 3.5–5.1)
Sodium: 138 mmol/L (ref 135–145)
Total Bilirubin: 0.6 mg/dL (ref 0.3–1.2)
Total Protein: 5.6 g/dL — ABNORMAL LOW (ref 6.5–8.1)

## 2023-05-28 LAB — CBC WITH DIFFERENTIAL (CANCER CENTER ONLY)
Abs Immature Granulocytes: 0.24 10*3/uL — ABNORMAL HIGH (ref 0.00–0.07)
Basophils Absolute: 0.1 10*3/uL (ref 0.0–0.1)
Basophils Relative: 1 %
Eosinophils Absolute: 0.5 10*3/uL (ref 0.0–0.5)
Eosinophils Relative: 4 %
HCT: 38.3 % (ref 36.0–46.0)
Hemoglobin: 12.8 g/dL (ref 12.0–15.0)
Immature Granulocytes: 2 %
Lymphocytes Relative: 13 %
Lymphs Abs: 1.7 10*3/uL (ref 0.7–4.0)
MCH: 33.5 pg (ref 26.0–34.0)
MCHC: 33.4 g/dL (ref 30.0–36.0)
MCV: 100.3 fL — ABNORMAL HIGH (ref 80.0–100.0)
Monocytes Absolute: 1.1 10*3/uL — ABNORMAL HIGH (ref 0.1–1.0)
Monocytes Relative: 8 %
Neutro Abs: 9.7 10*3/uL — ABNORMAL HIGH (ref 1.7–7.7)
Neutrophils Relative %: 72 %
Platelet Count: 264 10*3/uL (ref 150–400)
RBC: 3.82 MIL/uL — ABNORMAL LOW (ref 3.87–5.11)
RDW: 13.2 % (ref 11.5–15.5)
WBC Count: 13.4 10*3/uL — ABNORMAL HIGH (ref 4.0–10.5)
nRBC: 0 % (ref 0.0–0.2)

## 2023-05-28 MED ORDER — DEXTROSE 5 % IV SOLN
70.0000 mg/m2 | Freq: Once | INTRAVENOUS | Status: AC
  Administered 2023-05-28: 120 mg via INTRAVENOUS
  Filled 2023-05-28: qty 60

## 2023-05-28 MED ORDER — SODIUM CHLORIDE 0.9 % IV SOLN
40.0000 mg | Freq: Once | INTRAVENOUS | Status: AC
  Administered 2023-05-28: 40 mg via INTRAVENOUS
  Filled 2023-05-28: qty 4

## 2023-05-28 MED ORDER — SODIUM CHLORIDE 0.9 % IV SOLN: Freq: Once | INTRAVENOUS | Status: AC

## 2023-05-28 NOTE — Patient Instructions (Signed)
[place] [facility]  Discharge Instructions: Thank you for choosing [facility] to provide your oncology and hematology care.   If you have a lab appointment with the Cancer Center, please go directly to the Cancer Center and check in at the registration area.   Wear comfortable clothing and clothing appropriate for easy access to any Portacath or PICC line.   We strive to give you quality time with your provider. You may need to reschedule your appointment if you arrive late (15 or more minutes).  Arriving late affects you and other patients whose appointments are after yours.  Also, if you miss three or more appointments without notifying the office, you may be dismissed from the clinic at the provider's discretion.      For prescription refill requests, have your pharmacy contact our office and allow 72 hours for refills to be completed.    Today you received the following chemotherapy and/or immunotherapy agents Carfilzomib      To help prevent nausea and vomiting after your treatment, we encourage you to take your nausea medication as directed.  BELOW ARE SYMPTOMS THAT SHOULD BE REPORTED IMMEDIATELY: *FEVER GREATER THAN 100.4 F (38 C) OR HIGHER *CHILLS OR SWEATING *NAUSEA AND VOMITING THAT IS NOT CONTROLLED WITH YOUR NAUSEA MEDICATION *UNUSUAL SHORTNESS OF BREATH *UNUSUAL BRUISING OR BLEEDING *URINARY PROBLEMS (pain or burning when urinating, or frequent urination) *BOWEL PROBLEMS (unusual diarrhea, constipation, pain near the anus) TENDERNESS IN MOUTH AND THROAT WITH OR WITHOUT PRESENCE OF ULCERS (sore throat, sores in mouth, or a toothache) UNUSUAL RASH, SWELLING OR PAIN  UNUSUAL VAGINAL DISCHARGE OR ITCHING   Items with * indicate a potential emergency and should be followed up as soon as possible or go to the Emergency Department if any problems should occur.  Please show the CHEMOTHERAPY ALERT CARD or IMMUNOTHERAPY ALERT CARD at  check-in to the Emergency Department and triage nurse.  Should you have questions after your visit or need to cancel or reschedule your appointment, please contact [place] [facility]  Dept: [phone]  and follow the prompts.  Office hours are 8:00 a.m. to 4:30 p.m. Monday - Friday. Please note that voicemails left after 4:00 p.m. may not be returned until the following business day.  We are closed weekends and major holidays. You have access to a nurse at all times for urgent questions. Please call the main number to the clinic Dept: [phone] and follow the prompts.   For any non-urgent questions, you may also contact your provider using MyChart. We now offer e-Visits for anyone 35 and older to request care online for non-urgent symptoms. For details visit [email or web address].   Also download the MyChart app! Go to the app store, search "MyChart", open the app, select [place], and log in with your MyChart username and password.

## 2023-05-29 LAB — KAPPA/LAMBDA LIGHT CHAINS
Kappa free light chain: 1.2 mg/L — ABNORMAL LOW (ref 3.3–19.4)
Kappa, lambda light chain ratio: 0.22 — ABNORMAL LOW (ref 0.26–1.65)
Lambda free light chains: 5.4 mg/L — ABNORMAL LOW (ref 5.7–26.3)

## 2023-06-02 LAB — MULTIPLE MYELOMA PANEL, SERUM
Albumin SerPl Elph-Mcnc: 3.2 g/dL (ref 2.9–4.4)
Albumin/Glob SerPl: 1.7 (ref 0.7–1.7)
Alpha 1: 0.2 g/dL (ref 0.0–0.4)
Alpha2 Glob SerPl Elph-Mcnc: 0.5 g/dL (ref 0.4–1.0)
B-Globulin SerPl Elph-Mcnc: 0.7 g/dL (ref 0.7–1.3)
Gamma Glob SerPl Elph-Mcnc: 0.5 g/dL (ref 0.4–1.8)
Globulin, Total: 2 g/dL — ABNORMAL LOW (ref 2.2–3.9)
IgA: 8 mg/dL — ABNORMAL LOW (ref 64–422)
IgG (Immunoglobin G), Serum: 505 mg/dL — ABNORMAL LOW (ref 586–1602)
IgM (Immunoglobulin M), Srm: 8 mg/dL — ABNORMAL LOW (ref 26–217)
M Protein SerPl Elph-Mcnc: 0.4 g/dL — ABNORMAL HIGH
Total Protein ELP: 5.2 g/dL — ABNORMAL LOW (ref 6.0–8.5)

## 2023-06-04 ENCOUNTER — Other Ambulatory Visit: Payer: Medicare Other

## 2023-06-04 ENCOUNTER — Ambulatory Visit: Payer: Medicare Other | Admitting: Hematology and Oncology

## 2023-06-04 ENCOUNTER — Ambulatory Visit: Payer: Medicare Other

## 2023-06-11 ENCOUNTER — Inpatient Hospital Stay: Payer: Medicare Other

## 2023-06-11 ENCOUNTER — Encounter: Payer: Self-pay | Admitting: Nurse Practitioner

## 2023-06-11 ENCOUNTER — Ambulatory Visit: Payer: Medicare Other

## 2023-06-11 ENCOUNTER — Other Ambulatory Visit: Payer: Medicare Other

## 2023-06-11 ENCOUNTER — Inpatient Hospital Stay: Payer: Medicare Other | Admitting: Nurse Practitioner

## 2023-06-11 DIAGNOSIS — C9000 Multiple myeloma not having achieved remission: Secondary | ICD-10-CM

## 2023-06-11 DIAGNOSIS — Z79899 Other long term (current) drug therapy: Secondary | ICD-10-CM | POA: Diagnosis not present

## 2023-06-11 DIAGNOSIS — Z5112 Encounter for antineoplastic immunotherapy: Secondary | ICD-10-CM | POA: Diagnosis not present

## 2023-06-11 DIAGNOSIS — E785 Hyperlipidemia, unspecified: Secondary | ICD-10-CM

## 2023-06-11 LAB — CMP (CANCER CENTER ONLY)
ALT: 16 U/L (ref 0–44)
AST: 19 U/L (ref 15–41)
Albumin: 3.7 g/dL (ref 3.5–5.0)
Alkaline Phosphatase: 52 U/L (ref 38–126)
Anion gap: 6 (ref 5–15)
BUN: 10 mg/dL (ref 8–23)
CO2: 28 mmol/L (ref 22–32)
Calcium: 8.9 mg/dL (ref 8.9–10.3)
Chloride: 105 mmol/L (ref 98–111)
Creatinine: 0.7 mg/dL (ref 0.44–1.00)
GFR, Estimated: >60 mL/min (ref >60)
Glucose, Bld: 174 mg/dL — ABNORMAL HIGH (ref 70–99)
Potassium: 4.5 mmol/L (ref 3.5–5.1)
Sodium: 139 mmol/L (ref 135–145)
Total Bilirubin: 0.6 mg/dL (ref 0.3–1.2)
Total Protein: 5.6 g/dL — ABNORMAL LOW (ref 6.5–8.1)

## 2023-06-11 LAB — CBC WITH DIFFERENTIAL (CANCER CENTER ONLY)
Abs Immature Granulocytes: 0.03 10*3/uL (ref 0.00–0.07)
Basophils Absolute: 0.1 10*3/uL (ref 0.0–0.1)
Basophils Relative: 1 %
Eosinophils Absolute: 0.2 10*3/uL (ref 0.0–0.5)
Eosinophils Relative: 2 %
HCT: 37.8 % (ref 36.0–46.0)
Hemoglobin: 12.2 g/dL (ref 12.0–15.0)
Immature Granulocytes: 0 %
Lymphocytes Relative: 16 %
Lymphs Abs: 1.3 10*3/uL (ref 0.7–4.0)
MCH: 32.9 pg (ref 26.0–34.0)
MCHC: 32.3 g/dL (ref 30.0–36.0)
MCV: 101.9 fL — ABNORMAL HIGH (ref 80.0–100.0)
Monocytes Absolute: 0.6 10*3/uL (ref 0.1–1.0)
Monocytes Relative: 8 %
Neutro Abs: 6.1 10*3/uL (ref 1.7–7.7)
Neutrophils Relative %: 73 %
Platelet Count: 571 10*3/uL — ABNORMAL HIGH (ref 150–400)
RBC: 3.71 MIL/uL — ABNORMAL LOW (ref 3.87–5.11)
RDW: 12.8 % (ref 11.5–15.5)
WBC Count: 8.2 10*3/uL (ref 4.0–10.5)
nRBC: 0 % (ref 0.0–0.2)

## 2023-06-11 LAB — LIPID PANEL
Cholesterol: 271 mg/dL — ABNORMAL HIGH (ref 0–200)
HDL: 44 mg/dL (ref >40)
LDL Cholesterol: 185 mg/dL — ABNORMAL HIGH (ref 0–99)
Total CHOL/HDL Ratio: 6.2 {ratio}
Triglycerides: 208 mg/dL — ABNORMAL HIGH (ref <150)
VLDL: 42 mg/dL — ABNORMAL HIGH (ref 0–40)

## 2023-06-11 MED ORDER — SODIUM CHLORIDE 0.9 % IV SOLN
40.0000 mg | Freq: Once | INTRAVENOUS | Status: DC
  Filled 2023-06-11: qty 4

## 2023-06-11 MED ORDER — SODIUM CHLORIDE 0.9 % IV SOLN: Freq: Once | INTRAVENOUS | Status: AC

## 2023-06-11 MED ORDER — SODIUM CHLORIDE 0.9 % IV SOLN
40.0000 mg | Freq: Once | INTRAVENOUS | Status: AC
  Administered 2023-06-11: 40 mg via INTRAVENOUS
  Filled 2023-06-11: qty 4

## 2023-06-11 MED ORDER — CARFILZOMIB CHEMO INJECTION 60 MG
70.0000 mg/m2 | Freq: Once | INTRAVENOUS | Status: AC
  Administered 2023-06-11: 120 mg via INTRAVENOUS
  Filled 2023-06-11: qty 60

## 2023-06-11 MED ORDER — DEXTROSE 5 % IV SOLN: 70.0000 mg/m2 | Freq: Once | INTRAVENOUS | Status: DC

## 2023-06-11 NOTE — Progress Notes (Signed)
per Santiago Glad, NP, today will be Day 1 Cycle 8.  Pt missed her tx last week. Dr Leonides Schanz is on PAL today.  Ebony Hail, Pharm.D., CPP 06/11/2023@12 :06 PM

## 2023-06-11 NOTE — Progress Notes (Signed)
Patient Care Team: Farris Has, MD as PCP - General (Family Medicine)   CHIEF COMPLAINT: Follow-up IgG lambda multiple myeloma  ONCOLOGIC HISTORY: 07/27/2018-09/2018: Received weekly velcade plus dexamethasone. Therapy changed due to poor response.  09/2018: Started Revlimid 25 mg for 21 days on, 7 days off of a 28 day cycle with dexamethasone. Therapy was held after 2 cycles because of severe dermatological toxicity 12/2018: Pomalyst 1 mg daily for 21 days.  She completed 4 cycles of therapy and therapy was held due to occurrence of dermatological toxicity.  07/2019: Started Ninlaro 4 mg weekly with dexamethasone 20 mg weekly for 3 weeks on and 1 week off. Daratumumab was addd in April 2021. Clearnce Sorrel was discontinued in November 2021. 05/18/2020: Started monthly daratumumab and transitioned to q 3 month schedule in April 2022.  11/06/2022: Darzalex discontinued due to progression of disease. M protein 0.9 with worsening lytic lesions in the skeleton.  11/20/2022: Cycle 1 Day 1 of Kyprolis/Dex therapy.  12/19/2022: Cycle 2 Day 1 of Kyprolis/Dex therapy 01/15/2023: Cycle 3 Day 1 of Kyprolis/Dex therapy 02/19/2023: Cycle 4 Day 1 of Kyprolis/Dex therapy 03/18/2023: Cycle 5 Day 1 of Kyprolis/Dex therapy 04/25/2023: Cycle 6 Day 1 of Kyprolis/Dex therapy  CURRENT THERAPY: Kyprolis/Dex therapy  INTERVAL HISTORY Nicole Keith returns for follow-up and treatment as scheduled. She continues weekly kyprolis/dex. She required 7 total IV sticks last time. She is otherwise doing well. Energy better in the morning. Eating and drinking. Back brace helps back pain, no new/worsening pain. Bowels moving. Skin rash is stable, managed with kenalog. She has neuropathy from carpal tunnel and thinks she hit her left knuckle on something. Pain is improving.    ROS  All other systems reviewed and negative   Past Medical History:  Diagnosis Date   Allergic rhinitis    Arthritis    Colitis, collagenous     Diverticular disease    severe sig tics/fixation 2012   Family hx of colon cancer    Fracture of femoral neck, right (HCC) 01/09/2013   GERD (gastroesophageal reflux disease)    exacerbation of reflux-like symptoms 04/2011   Hiatal hernia    Hyperlipidemia    muliplt myelom 2020   Osteoarthritis of right hip 01/10/2013     Past Surgical History:  Procedure Laterality Date   CHOLECYSTECTOMY     EYE SURGERY  feb 2016   cataract right eye   LEG SURGERY Left    TONSILLECTOMY     TOTAL HIP ARTHROPLASTY Right 01/10/2013   Procedure: TOTAL HIP ARTHROPLASTY;  Surgeon: Eulas Post, MD;  Location: [facility] OR;  Service: Orthopedics;  Laterality: Right;     Outpatient Encounter Medications as of 06/11/2023  Medication Sig   acetaminophen (TYLENOL) 500 MG tablet Take 1,000 mg by mouth See admin instructions. Take 1,000 mg by mouth after breakfast, at 3:30 PM, and at 8:30 PM   albuterol (VENTOLIN HFA) 108 (90 Base) MCG/ACT inhaler SMARTSIG:1 Puff(s) By Mouth Every 4-6 Hours PRN   famotidine (PEPCID) 20 MG tablet Take 1 tablet (20 mg total) by mouth daily.   famotidine-calcium carbonate-magnesium hydroxide (PEPCID COMPLETE) 10-800-165 MG chewable tablet Chew 1 tablet by mouth daily as needed (for heartburn or indigestion).    guaiFENesin (MUCINEX) 600 MG 12 hr tablet Take 1 tablet (600 mg total) by mouth 2 (two) times daily.   loperamide (IMODIUM) 2 MG capsule Take 1 capsule (2 mg total) by mouth 3 (three) times daily between meals as needed for diarrhea or loose stools.  triamcinolone cream (KENALOG) 0.1 % Apply 1 Application topically 2 (two) times daily. (Patient taking differently: Apply 1 Application topically 2 (two) times daily as needed (for allergic flares- affected areas).)   VASELINE PURE ULTRA WHITE ointment Apply 1 application  topically See admin instructions. Apply to the genitalia in the morning and at bedtime   acyclovir (ZOVIRAX) 400 MG tablet Take 1 tablet (400 mg total) by mouth 2  (two) times daily. (Patient not taking: Reported on 01/16/2023)   No facility-administered encounter medications on file as of 06/11/2023.     Today's Vitals   06/11/23 1000 06/11/23 1038  BP:  (!) 140/69  Pulse:  74  Resp:  18  Temp:  97.9 F (36.6 C)  SpO2:  97%  Weight:  125 lb 8 oz (56.9 kg)  PainSc: 0-No pain    Body mass index is 20.26 kg/m.   PHYSICAL EXAM GENERAL:alert, no distress and comfortable SKIN: mild maculopapular rash to bilateral arms EYES: sclera clear LUNGS: clear with normal breathing effort HEART: regular rate & rhythm, no lower extremity edema NEURO: alert & oriented x 3 with fluent speech    CBC    Component Value Date/Time   WBC 8.2 06/11/2023 0955   WBC 6.4 03/28/2023 1650   RBC 3.71 (L) 06/11/2023 0955   HGB 12.2 06/11/2023 0955   HGB 10.8 (L) 06/11/2017 0753   HCT 37.8 06/11/2023 0955   HCT 32.2 (L) 06/11/2017 0753   PLT 571 (H) 06/11/2023 0955   PLT 321 06/11/2017 0753   MCV 101.9 (H) 06/11/2023 0955   MCV 95.4 06/11/2017 0753   MCH 32.9 06/11/2023 0955   MCHC 32.3 06/11/2023 0955   RDW 12.8 06/11/2023 0955   RDW 15.6 (H) 06/11/2017 0753   LYMPHSABS 1.3 06/11/2023 0955   LYMPHSABS 2.1 06/11/2017 0753   MONOABS 0.6 06/11/2023 0955   MONOABS 0.5 06/11/2017 0753   EOSABS 0.2 06/11/2023 0955   EOSABS 0.1 06/11/2017 0753   BASOSABS 0.1 06/11/2023 0955   BASOSABS 0.1 06/11/2017 0753     CMP     Component Value Date/Time   NA 139 06/11/2023 0955   NA 135 (L) 06/11/2017 0753   K 4.5 06/11/2023 0955   K 4.0 06/11/2017 0753   CL 105 06/11/2023 0955   CO2 28 06/11/2023 0955   CO2 26 06/11/2017 0753   GLUCOSE 174 (H) 06/11/2023 0955   GLUCOSE 103 06/11/2017 0753   BUN 10 06/11/2023 0955   BUN 12.8 06/11/2017 0753   CREATININE 0.70 06/11/2023 0955   CREATININE 0.8 06/11/2017 0753   CALCIUM 8.9 06/11/2023 0955   CALCIUM 8.8 06/11/2017 0753   PROT 5.6 (L) 06/11/2023 0955   PROT 11.2 (H) 06/11/2017 0753   PROT 10.5 (H) 06/11/2017  0753   ALBUMIN 3.7 06/11/2023 0955   ALBUMIN 2.9 (L) 06/11/2017 0753   AST 19 06/11/2023 0955   AST 21 06/11/2017 0753   ALT 16 06/11/2023 0955   ALT 13 06/11/2017 0753   ALKPHOS 52 06/11/2023 0955   ALKPHOS 72 06/11/2017 0753   BILITOT 0.6 06/11/2023 0955   BILITOT 0.35 06/11/2017 0753   GFRNONAA >60 06/11/2023 0955   GFRNONAA 78 09/15/2016 1155   GFRAA >60 05/04/2020 1128   GFRAA >89 09/15/2016 1155     ASSESSMENT & PLAN:Nicole Keith is a 82 y.o.female who presents to the clinic for follow up for multiple myeloma.   #IgG Lambda Multiple Myeloma: --Initially presented in April 2016 with M spike of 3.2 g/dL  and IgG level of 4685. Underwent bone marrow biopsy from 11/20/2014 show 29% plasma cells. 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  --In December 2019 with rising M spike of 5.9 g/dL and worsening anemia, with Hgb 8.7, she met criteria for transformation of active multiple myeloma.  --From 07/27/2018-09/2018, received weekly velcade plus dexamethasone. Therapy changed due to poor response.  --In 09/2018, started Revlimid 25 mg for 21 days on, 7 days off of a 28 day cycle with dexamethasone. Therapy was held after 2 cycles because of severe dermatological toxicity --In 12/2018, switched to Pomalyst 1 mg daily for 21 days.  She completed 4 cycles of therapy and therapy was held due to occurrence of dermatological toxicity.  --In 07/2019, switch to Ninlaro 4 mg weekly with dexamethasone 20 mg weekly for 3 weeks on and 1 week off. Daratumumab was addd in April 2021. Clearnce Sorrel was discontinued in November 2021. --On 05/18/2020, started monthly daratumumab and transitioned to q 3 month schedule in April 2022.  --On 11/20/2022, switch to Kyprolis/Dex due to rising M-protein and bone survey that showed worsening lytic lesions.  M protein decreasing on therapy   Disposition:  Ms. Grange appears stable. She continues Kyprolis/Dex, tolerating well overall. Rash is stable.  SEs are adequately managed with supportive care at home. She is able to recover and function with adequate PS. No clinical evidence of disease progression.   Labs reviewed, adequate to proceed with Kyrolis/Dex today as scheduled, no dose adjustments.   Ms. Hedgpeth discussed treatment weekly x3 then a brief break for Thanksgiving. Will discuss schedule with Dr. Leonides Schanz upon his return.    All questions were answered. The patient knows to call the clinic with any problems, questions or concerns. No barriers to learning were detected.   Santiago Glad, NP-C 06/11/2023

## 2023-06-11 NOTE — Patient Instructions (Signed)
[facility]  Discharge Instructions: Thank you for choosing [facility] to provide your oncology and hematology care.   If you have a lab appointment with the Cancer Center, please go directly to the Cancer Center and check in at the registration area.   Wear comfortable clothing and clothing appropriate for easy access to any Portacath or PICC line.   We strive to give you quality time with your provider. You may need to reschedule your appointment if you arrive late (15 or more minutes).  Arriving late affects you and other patients whose appointments are after yours.  Also, if you miss three or more appointments without notifying the office, you may be dismissed from the clinic at the provider's discretion.      For prescription refill requests, have your pharmacy contact our office and allow 72 hours for refills to be completed.    Today you received the following chemotherapy and/or immunotherapy agents: carfilzomib      To help prevent nausea and vomiting after your treatment, we encourage you to take your nausea medication as directed.  BELOW ARE SYMPTOMS THAT SHOULD BE REPORTED IMMEDIATELY: *FEVER GREATER THAN 100.4 F (38 C) OR HIGHER *CHILLS OR SWEATING *NAUSEA AND VOMITING THAT IS NOT CONTROLLED WITH YOUR NAUSEA MEDICATION *UNUSUAL SHORTNESS OF BREATH *UNUSUAL BRUISING OR BLEEDING *URINARY PROBLEMS (pain or burning when urinating, or frequent urination) *BOWEL PROBLEMS (unusual diarrhea, constipation, pain near the anus) TENDERNESS IN MOUTH AND THROAT WITH OR WITHOUT PRESENCE OF ULCERS (sore throat, sores in mouth, or a toothache) UNUSUAL RASH, SWELLING OR PAIN  UNUSUAL VAGINAL DISCHARGE OR ITCHING   Items with * indicate a potential emergency and should be followed up as soon as possible or go to the Emergency Department if any problems should occur.  Please show the CHEMOTHERAPY ALERT CARD or IMMUNOTHERAPY ALERT CARD at  check-in to the Emergency Department and triage nurse.  Should you have questions after your visit or need to cancel or reschedule your appointment, please contact [facility]  Dept: [phone]  and follow the prompts.  Office hours are 8:00 a.m. to 4:30 p.m. Monday - Friday. Please note that voicemails left after 4:00 p.m. may not be returned until the following business day.  We are closed weekends and major holidays. You have access to a nurse at all times for urgent questions. Please call the main number to the clinic Dept: [phone] and follow the prompts.   For any non-urgent questions, you may also contact your provider using MyChart. We now offer e-Visits for anyone 69 and older to request care online for non-urgent symptoms. For details visit [email or web address].   Also download the MyChart app! Go to the app store, search "MyChart", open the app, select [facility], and log in with your MyChart username and password.

## 2023-06-14 ENCOUNTER — Other Ambulatory Visit: Payer: Self-pay

## 2023-06-18 ENCOUNTER — Ambulatory Visit: Payer: Medicare Other | Admitting: Hematology and Oncology

## 2023-06-18 ENCOUNTER — Other Ambulatory Visit: Payer: Medicare Other

## 2023-06-18 ENCOUNTER — Ambulatory Visit: Payer: Medicare Other

## 2023-06-22 ENCOUNTER — Encounter: Payer: Self-pay | Admitting: Hematology and Oncology

## 2023-06-24 ENCOUNTER — Other Ambulatory Visit: Payer: Self-pay | Admitting: Hematology and Oncology

## 2023-06-24 ENCOUNTER — Inpatient Hospital Stay (HOSPITAL_BASED_OUTPATIENT_CLINIC_OR_DEPARTMENT_OTHER): Payer: Medicare Other | Admitting: Hematology and Oncology

## 2023-06-24 ENCOUNTER — Inpatient Hospital Stay: Payer: Medicare Other

## 2023-06-24 ENCOUNTER — Ambulatory Visit: Payer: Medicare Other | Admitting: Nurse Practitioner

## 2023-06-24 ENCOUNTER — Inpatient Hospital Stay: Payer: Medicare Other | Attending: Physician Assistant

## 2023-06-24 VITALS — BP 137/75 | HR 64 | Temp 98.0°F | Resp 13 | Wt 124.4 lb

## 2023-06-24 DIAGNOSIS — Z5112 Encounter for antineoplastic immunotherapy: Secondary | ICD-10-CM | POA: Insufficient documentation

## 2023-06-24 DIAGNOSIS — Z79899 Other long term (current) drug therapy: Secondary | ICD-10-CM | POA: Insufficient documentation

## 2023-06-24 DIAGNOSIS — C9000 Multiple myeloma not having achieved remission: Secondary | ICD-10-CM | POA: Diagnosis not present

## 2023-06-24 LAB — CMP (CANCER CENTER ONLY)
ALT: 12 U/L (ref 0–44)
AST: 17 U/L (ref 15–41)
Albumin: 3.8 g/dL (ref 3.5–5.0)
Alkaline Phosphatase: 51 U/L (ref 38–126)
Anion gap: 5 (ref 5–15)
BUN: 11 mg/dL (ref 8–23)
CO2: 29 mmol/L (ref 22–32)
Calcium: 8.9 mg/dL (ref 8.9–10.3)
Chloride: 104 mmol/L (ref 98–111)
Creatinine: 0.66 mg/dL (ref 0.44–1.00)
GFR, Estimated: >60 mL/min (ref >60)
Glucose, Bld: 143 mg/dL — ABNORMAL HIGH (ref 70–99)
Potassium: 3.9 mmol/L (ref 3.5–5.1)
Sodium: 138 mmol/L (ref 135–145)
Total Bilirubin: 0.5 mg/dL (ref <1.2)
Total Protein: 5.7 g/dL — ABNORMAL LOW (ref 6.5–8.1)

## 2023-06-24 LAB — CBC WITH DIFFERENTIAL (CANCER CENTER ONLY)
Abs Immature Granulocytes: 0.05 10*3/uL (ref 0.00–0.07)
Basophils Absolute: 0.1 10*3/uL (ref 0.0–0.1)
Basophils Relative: 1 %
Eosinophils Absolute: 0.2 10*3/uL (ref 0.0–0.5)
Eosinophils Relative: 2 %
HCT: 37.9 % (ref 36.0–46.0)
Hemoglobin: 12.5 g/dL (ref 12.0–15.0)
Immature Granulocytes: 0 %
Lymphocytes Relative: 14 %
Lymphs Abs: 1.5 10*3/uL (ref 0.7–4.0)
MCH: 33 pg (ref 26.0–34.0)
MCHC: 33 g/dL (ref 30.0–36.0)
MCV: 100 fL (ref 80.0–100.0)
Monocytes Absolute: 0.9 10*3/uL (ref 0.1–1.0)
Monocytes Relative: 8 %
Neutro Abs: 8.4 10*3/uL — ABNORMAL HIGH (ref 1.7–7.7)
Neutrophils Relative %: 75 %
Platelet Count: 401 10*3/uL — ABNORMAL HIGH (ref 150–400)
RBC: 3.79 MIL/uL — ABNORMAL LOW (ref 3.87–5.11)
RDW: 12.7 % (ref 11.5–15.5)
WBC Count: 11.3 10*3/uL — ABNORMAL HIGH (ref 4.0–10.5)
nRBC: 0 % (ref 0.0–0.2)

## 2023-06-24 MED ORDER — DEXTROSE 5 % IV SOLN
70.0000 mg/m2 | Freq: Once | INTRAVENOUS | Status: AC
  Administered 2023-06-24: 120 mg via INTRAVENOUS
  Filled 2023-06-24: qty 60

## 2023-06-24 MED ORDER — SODIUM CHLORIDE 0.9% FLUSH
10.0000 mL | INTRAVENOUS | Status: DC | PRN
Start: 2023-06-24 — End: 2023-06-24

## 2023-06-24 MED ORDER — HEPARIN SOD (PORK) LOCK FLUSH 100 UNIT/ML IV SOLN
500.0000 [IU] | Freq: Once | INTRAVENOUS | Status: DC | PRN
Start: 2023-06-24 — End: 2023-06-24

## 2023-06-24 MED ORDER — SODIUM CHLORIDE 0.9 % IV SOLN
40.0000 mg | Freq: Once | INTRAVENOUS | Status: AC
  Administered 2023-06-24: 40 mg via INTRAVENOUS
  Filled 2023-06-24: qty 4

## 2023-06-24 MED ORDER — SODIUM CHLORIDE 0.9 % IV SOLN
Freq: Once | INTRAVENOUS | Status: AC
Start: 2023-06-24 — End: 2023-06-24

## 2023-06-24 MED ORDER — SODIUM CHLORIDE 0.9 % IV SOLN: Freq: Once | INTRAVENOUS | Status: AC

## 2023-06-24 NOTE — Progress Notes (Signed)
[facility] [phone]   [phone]  PROGRESS NOTE  Patient Care Team: Farris Has, MD as PCP - General (Family Medicine)  CHIEF COMPLAINTS/PURPOSE OF CONSULTATION:  IgG Lambda Multiple Myeloma  ONCOLOGIC HISTORY: 07/27/2018-09/2018: Received weekly velcade plus dexamethasone. Therapy changed due to poor response.  09/2018: Started Revlimid 25 mg for 21 days on, 7 days off of a 28 day cycle with dexamethasone. Therapy was held after 2 cycles because of severe dermatological toxicity 12/2018: Pomalyst 1 mg daily for 21 days.  She completed 4 cycles of therapy and therapy was held due to occurrence of dermatological toxicity.  07/2019: Started Ninlaro 4 mg weekly with dexamethasone 20 mg weekly for 3 weeks on and 1 week off. Daratumumab was addd in April 2021. Clearnce Sorrel was discontinued in November 2021. 05/18/2020: Started monthly daratumumab and transitioned to q 3 month schedule in April 2022.  11/06/2022: Darzalex discontinued due to progression of disease. M protein 0.9 with worsening lytic lesions in the skeleton.  11/20/2022: Cycle 1 Day 1 of Kyprolis/Dex therapy.  12/19/2022: Cycle 2 Day 1 of Kyprolis/Dex therapy 01/15/2023: Cycle 3 Day 1 of Kyprolis/Dex therapy 02/19/2023: Cycle 4 Day 1 of Kyprolis/Dex therapy 03/18/2023: Cycle 5 Day 1 of Kyprolis/Dex therapy 04/25/2023: Cycle 6 Day 1 of Kyprolis/Dex therapy 05/28/2023: Cycle 7 Day 1 of Kyprolis/Dex therapy 06/11/2023: Cycle 8 Day 1 of Kyprolis/Dex therapy  HISTORY OF PRESENTING ILLNESS:  Nicole Keith 82 y.o. female returns for a follow up for IgG lambda multiple myeloma. She was last seen on 05/14/2023. She presents today for Cycle 8, Day 8 of Kyprolis/Dex.   On exam today, Nicole Keith reports she has been having some continued trouble with a rash on her arms.  She reports it is itchy but not painful.  She has been applying triamcinolone cream to it with some modest relief.  She reports that she  went to bed about 9:30 PM last night but woke up at 11 PM due to stomach cramps and had a bowel movement.  She reports that she would really like to move to every other week treatment but we discussed as we frequently do that it is recommended to continue on the regimen of 3 weeks on and 1 week off.  She notes that she would like off 07/01/2023 in order to feel better for the holidays.  We were agreeable to this.  She reports that she has been doing her best to drink a lot of water though she does occasionally get dizzy.  She is not having any fevers, chills, sweats, nausea, vomiting or diarrhea.  She reports that other than the rash she has been eating well and feels healthy.  Otherwise she has no questions concerns or complaints today.  Overall she is willing and able to proceed with chemotherapy today.  She denies fevers, chills, sweats, shortness of breath, chest pain or cough. She has no other complaints. Rest of the 10 point ROS is below.  MEDICAL HISTORY:  Past Medical History:  Diagnosis Date   Allergic rhinitis    Arthritis    Colitis, collagenous    Diverticular disease    severe sig tics/fixation 2012   Family hx of colon cancer    Fracture of femoral neck, right (HCC) 01/09/2013   GERD (gastroesophageal reflux disease)    exacerbation of reflux-like symptoms 04/2011   Hiatal hernia    Hyperlipidemia    muliplt myelom 2020   Osteoarthritis of right hip 01/10/2013    SURGICAL  HISTORY: Past Surgical History:  Procedure Laterality Date   CHOLECYSTECTOMY     EYE SURGERY  feb 2016   cataract right eye   LEG SURGERY Left    TONSILLECTOMY     TOTAL HIP ARTHROPLASTY Right 01/10/2013   Procedure: TOTAL HIP ARTHROPLASTY;  Surgeon: Eulas Post, MD;  Location: [facility] OR;  Service: Orthopedics;  Laterality: Right;    SOCIAL HISTORY: Social History   Socioeconomic History   Marital status: Married    Spouse name: Not on file   Number of children: 0   Years of education: Not on file    Highest education level: Some college, no degree  Occupational History   Occupation: retired  Tobacco Use   Smoking status: Former    Current packs/day: 0.00    Types: Cigarettes    Quit date: 01/10/1972    Years since quitting: 51.4   Smokeless tobacco: Never   Tobacco comments:    quit 1973  Vaping Use   Vaping status: Never Used  Substance and Sexual Activity   Alcohol use: No   Drug use: No   Sexual activity: Not on file  Other Topics Concern   Not on file  Social History Narrative   Married, no children. Drinks 2 cups decaf coffee a day. Walks daily. Is active, plays golf, she and her husband go hiking frequently. 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   Social Determinants of Health   Financial Resource Strain: Not on file  Food Insecurity: No Food Insecurity (01/17/2023)   Hunger Vital Sign    Worried About Running Out of Food in the Last Year: Never true    Ran Out of Food in the Last Year: Never true  Transportation Needs: No Transportation Needs (01/17/2023)   PRAPARE - Administrator, Civil Service (Medical): No    Lack of Transportation (Non-Medical): No  Physical Activity: Not on file  Stress: Not on file  Social Connections: Not on file  Intimate Partner Violence: Not At Risk (01/17/2023)   Humiliation, Afraid, Rape, and Kick questionnaire    Fear of Current or Ex-Partner: No    Emotionally Abused: No    Physically Abused: No    Sexually Abused: No    FAMILY HISTORY: Family History  Problem Relation Age of Onset   Congestive Heart Failure Mother    Arthritis-Osteo Mother    Colon cancer Mother    Heart attack Father    Rheum arthritis Father    Colon cancer Maternal Aunt    Colon cancer Maternal Aunt    Hypothyroidism Sister     ALLERGIES:  is allergic to kyprolis [carfilzomib], pomalyst [pomalidomide], revlimid [lenalidomide], betadine [povidone iodine], flagyl [metronidazole], fosamax [alendronate sodium], bactrim  [sulfamethoxazole-trimethoprim], clindamycin/lincomycin, doxepin hcl, hydrocodone-acetaminophen, hydroxyzine, hydroxyzine hcl, tramadol hcl, valium [diazepam], actonel [risedronate sodium], boniva [ibandronic acid], doxycycline, and penicillins.  MEDICATIONS:  Current Outpatient Medications  Medication Sig Dispense Refill   acetaminophen (TYLENOL) 500 MG tablet Take 1,000 mg by mouth See admin instructions. Take 1,000 mg by mouth after breakfast, at 3:30 PM, and at 8:30 PM     acyclovir (ZOVIRAX) 400 MG tablet Take 1 tablet (400 mg total) by mouth 2 (two) times daily. (Patient not taking: Reported on 01/16/2023) 60 tablet 3   albuterol (VENTOLIN HFA) 108 (90 Base) MCG/ACT inhaler SMARTSIG:1 Puff(s) By Mouth Every 4-6 Hours PRN     famotidine (PEPCID) 20 MG tablet Take 1 tablet (20 mg total) by mouth daily. 30 tablet  0   famotidine-calcium carbonate-magnesium hydroxide (PEPCID COMPLETE) 10-800-165 MG chewable tablet Chew 1 tablet by mouth daily as needed (for heartburn or indigestion).      guaiFENesin (MUCINEX) 600 MG 12 hr tablet Take 1 tablet (600 mg total) by mouth 2 (two) times daily. 40 tablet 0   loperamide (IMODIUM) 2 MG capsule Take 1 capsule (2 mg total) by mouth 3 (three) times daily between meals as needed for diarrhea or loose stools. 30 capsule 1   triamcinolone cream (KENALOG) 0.1 % Apply 1 Application topically 2 (two) times daily. (Patient taking differently: Apply 1 Application topically 2 (two) times daily as needed (for allergic flares- affected areas).) 453.6 g 2   VASELINE PURE ULTRA WHITE ointment Apply 1 application  topically See admin instructions. Apply to the genitalia in the morning and at bedtime     No current facility-administered medications for this visit.   Facility-Administered Medications Ordered in Other Visits  Medication Dose Route Frequency Provider Last Rate Last Admin   heparin lock flush 100 unit/mL  500 Units Intracatheter Once PRN Ulysees Barns IV, MD        sodium chloride flush (NS) 0.9 % injection 10 mL  10 mL Intracatheter PRN Jaci Standard, MD        REVIEW OF SYSTEMS:   Constitutional: ( - ) fevers, ( - )  chills , ( - ) night sweats Eyes: ( - ) blurriness of vision, ( - ) double vision, ( - ) watery eyes Ears, nose, mouth, throat, and face: ( - ) mucositis, ( - ) sore throat Respiratory: ( - ) cough, ( - ) dyspnea, ( - ) wheezes Cardiovascular: ( - ) palpitation, ( - ) chest discomfort, ( - ) lower extremity swelling Gastrointestinal:  ( - ) nausea, ( - ) heartburn, ( - ) change in bowel habits Skin: ( +) abnormal skin rashes Lymphatics: ( - ) new lymphadenopathy, ( - ) easy bruising Neurological: ( - ) numbness, ( - ) tingling, ( - ) new weaknesses Behavioral/Psych: ( - ) mood change, ( - ) new changes  All other systems were reviewed with the patient and are negative.  PHYSICAL EXAMINATION: ECOG PERFORMANCE STATUS: 1 - Symptomatic but completely ambulatory  Vitals:   06/24/23 1126  BP: 137/75  Pulse: 64  Resp: 13  Temp: 98 F (36.7 C)  SpO2: 100%        Filed Weights   06/24/23 1126  Weight: 124 lb 6.4 oz (56.4 kg)      GENERAL: well appearing female in NAD  SKIN: skin color, texture, turgor are normal. Diffuse, flat erythematous rash.  EYES: conjunctiva are pink and non-injected, sclera clear LYMPH:  no palpable lymphadenopathy in the cervical or supraclavicular lymph nodes.  LUNGS: clear to auscultation and percussion with normal breathing effort HEART: regular rate & rhythm and no murmurs and no lower extremity edema Musculoskeletal: no cyanosis of digits and no clubbing  PSYCH: alert & oriented x 3, fluent speech NEURO: no focal motor/sensory deficits  LABORATORY DATA:  I have reviewed the data as listed    Latest Ref Rng & Units 06/24/2023   10:54 AM 06/11/2023    9:55 AM 05/28/2023   12:29 PM  CBC  WBC 4.0 - 10.5 K/uL 11.3  8.2  13.4   Hemoglobin 12.0 - 15.0 g/dL 16.1  09.6  04.5    Hematocrit 36.0 - 46.0 % 37.9  37.8  38.3   Platelets 150 -  400 K/uL 401  571  264        Latest Ref Rng & Units 06/24/2023   10:54 AM 06/11/2023    9:55 AM 05/28/2023   12:29 PM  CMP  Glucose 70 - 99 mg/dL 536  644  034   BUN 8 - 23 mg/dL 11  10  11    Creatinine 0.44 - 1.00 mg/dL 7.42  5.95  6.38   Sodium 135 - 145 mmol/L 138  139  138   Potassium 3.5 - 5.1 mmol/L 3.9  4.5  4.4   Chloride 98 - 111 mmol/L 104  105  105   CO2 22 - 32 mmol/L 29  28  28    Calcium 8.9 - 10.3 mg/dL 8.9  8.9  9.2   Total Protein 6.5 - 8.1 g/dL 5.7  5.6  5.6   Total Bilirubin <1.2 mg/dL 0.5  0.6  0.6   Alkaline Phos 38 - 126 U/L 51  52  51   AST 15 - 41 U/L 17  19  15    ALT 0 - 44 U/L 12  16  13      ASSESSMENT & PLAN Nicole Keith is a 82 y.o.female who presents to the clinic for follow up for multiple myeloma.  #IgG Lambda Multiple Myeloma: --Initially presented in April 2016 with M spike of 3.2 g/dL and IgG level of 7564. Underwent bone marrow biopsy from 11/20/2014 show 29% plasma cells. 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  --In December 2019 with rising M spike of 5.9 g/dL and worsening anemia, with Hgb 8.7, she met criteria for transformation of active multiple myeloma.  --From 07/27/2018-09/2018, received weekly velcade plus dexamethasone. Therapy changed due to poor response.  --In 09/2018, started Revlimid 25 mg for 21 days on, 7 days off of a 28 day cycle with dexamethasone. Therapy was held after 2 cycles because of severe dermatological toxicity --In 12/2018, switched to Pomalyst 1 mg daily for 21 days.  She completed 4 cycles of therapy and therapy was held due to occurrence of dermatological toxicity.  --In 07/2019, switch to Ninlaro 4 mg weekly with dexamethasone 20 mg weekly for 3 weeks on and 1 week off. Daratumumab was addd in April 2021. Clearnce Sorrel was discontinued in November 2021. --On 05/18/2020, started monthly daratumumab and transitioned to q 3 month  schedule in April 2022.  --On 11/20/2022, switch to Kyprolis/Dex due to rising M-protein and bone survey that showed worsening lytic lesions.  PLAN:   --Labs today show white blood cell count 11.3, hemoglobin 12.5, MCV 100, platelets 401, Creatinine and LFTs are normal.  --Most recent myeloma labs from 05/28/2023 showed improvement of M protein to 0.4.  --Due for Cycle 8 Day 8 of Kyprolis/Dex. Proceed without any dose modifications. Discussed that M protein needs to improve, closer to 0.2-0.3 before making any adjustments to the frequency of her treatment. I advised patient that weekly Kyprolis/Dex is standard of care.  --Underwent metastatic survey on 02/20/2023, appears to have stable lytic lesions from prior. --RTC in 2 weeks for continuation of Kyprolis/Dex with continued weekly treatments.   #Diffuse erythematous rash-stable --Managed with kenalog cream. -- nystatin powder for groin area.  --Discussed referral to dermatology, patient deferred at this time.   #Supportive Care -- port not required but can be placed if requested.  -- zofran 8mg  q8H PRN and compazine 10mg  PO q6H for nausea -- acyclovir 400mg  PO BID for VCZ prophylaxis -- no pain medication required at this time.  --  discussed role for zometa therapy in the setting her lytic lesions. Explained zometa is given q 12 weeks and requires a dental clearance.  After discussing this with the patient further today she knows she would like to hold on this for the time being as she is afraid of the side effects.  Strongly emphasized that she should reconsider as this will be helpful with her lytic bone lesions.  No orders of the defined types were placed in this encounter.  All questions were answered. The patient knows to call the clinic with any problems, questions or concerns.  I have spent a total of 30 minutes minutes of face-to-face and non-face-to-face time, preparing to see the patient,  performing a medically appropriate  examination, counseling and educating the patient, ordering medications/tests/procedures,documenting clinical information in the electronic health record, and care coordination.   Ulysees Barns, MD Department of Hematology/Oncology [facility] Phone: [phone] Pager: [phone] Email: [email or web address]

## 2023-06-24 NOTE — Patient Instructions (Signed)
[facility] - A DEPT OF [facility]  Discharge Instructions: Thank you for choosing [facility] to provide your oncology and hematology care.   If you have a lab appointment with the Cancer Center, please go directly to the Cancer Center and check in at the registration area.   Wear comfortable clothing and clothing appropriate for easy access to any Portacath or PICC line.   We strive to give you quality time with your provider. You may need to reschedule your appointment if you arrive late (15 or more minutes).  Arriving late affects you and other patients whose appointments are after yours.  Also, if you miss three or more appointments without notifying the office, you may be dismissed from the clinic at the provider's discretion.      For prescription refill requests, have your pharmacy contact our office and allow 72 hours for refills to be completed.    Today you received the following chemotherapy and/or immunotherapy agents: carfilzomib      To help prevent nausea and vomiting after your treatment, we encourage you to take your nausea medication as directed.  BELOW ARE SYMPTOMS THAT SHOULD BE REPORTED IMMEDIATELY: *FEVER GREATER THAN 100.4 F (38 C) OR HIGHER *CHILLS OR SWEATING *NAUSEA AND VOMITING THAT IS NOT CONTROLLED WITH YOUR NAUSEA MEDICATION *UNUSUAL SHORTNESS OF BREATH *UNUSUAL BRUISING OR BLEEDING *URINARY PROBLEMS (pain or burning when urinating, or frequent urination) *BOWEL PROBLEMS (unusual diarrhea, constipation, pain near the anus) TENDERNESS IN MOUTH AND THROAT WITH OR WITHOUT PRESENCE OF ULCERS (sore throat, sores in mouth, or a toothache) UNUSUAL RASH, SWELLING OR PAIN  UNUSUAL VAGINAL DISCHARGE OR ITCHING   Items with * indicate a potential emergency and should be followed up as soon as possible or go to the Emergency Department if any problems should occur.  Please show the CHEMOTHERAPY ALERT CARD or  IMMUNOTHERAPY ALERT CARD at check-in to the Emergency Department and triage nurse.  Should you have questions after your visit or need to cancel or reschedule your appointment, please contact [facility] - A DEPT OF [facility]  Dept: [phone]  and follow the prompts.  Office hours are 8:00 a.m. to 4:30 p.m. Monday - Friday. Please note that voicemails left after 4:00 p.m. may not be returned until the following business day.  We are closed weekends and major holidays. You have access to a nurse at all times for urgent questions. Please call the main number to the clinic Dept: [phone] and follow the prompts.   For any non-urgent questions, you may also contact your provider using MyChart. We now offer e-Visits for anyone 35 and older to request care online for non-urgent symptoms. For details visit [email or web address].   Also download the MyChart app! Go to the app store, search "MyChart", open the app, select [place], and log in with your MyChart username and password.

## 2023-07-01 ENCOUNTER — Other Ambulatory Visit: Payer: Medicare Other

## 2023-07-01 ENCOUNTER — Ambulatory Visit: Payer: Medicare Other

## 2023-07-08 ENCOUNTER — Inpatient Hospital Stay (HOSPITAL_BASED_OUTPATIENT_CLINIC_OR_DEPARTMENT_OTHER): Payer: Medicare Other | Admitting: Hematology and Oncology

## 2023-07-08 ENCOUNTER — Telehealth: Payer: Self-pay | Admitting: Hematology and Oncology

## 2023-07-08 ENCOUNTER — Other Ambulatory Visit: Payer: Self-pay

## 2023-07-08 ENCOUNTER — Inpatient Hospital Stay: Payer: Medicare Other

## 2023-07-08 VITALS — BP 143/67 | HR 66 | Temp 98.9°F | Resp 16 | Wt 124.9 lb

## 2023-07-08 DIAGNOSIS — C9000 Multiple myeloma not having achieved remission: Secondary | ICD-10-CM

## 2023-07-08 DIAGNOSIS — Z79899 Other long term (current) drug therapy: Secondary | ICD-10-CM | POA: Diagnosis not present

## 2023-07-08 DIAGNOSIS — Z5112 Encounter for antineoplastic immunotherapy: Secondary | ICD-10-CM | POA: Diagnosis not present

## 2023-07-08 LAB — CBC WITH DIFFERENTIAL (CANCER CENTER ONLY)
Abs Immature Granulocytes: 0.06 10*3/uL (ref 0.00–0.07)
Basophils Absolute: 0.1 10*3/uL (ref 0.0–0.1)
Basophils Relative: 1 %
Eosinophils Absolute: 0.4 10*3/uL (ref 0.0–0.5)
Eosinophils Relative: 3 %
HCT: 39.9 % (ref 36.0–46.0)
Hemoglobin: 12.9 g/dL (ref 12.0–15.0)
Immature Granulocytes: 1 %
Lymphocytes Relative: 14 %
Lymphs Abs: 1.7 10*3/uL (ref 0.7–4.0)
MCH: 33 pg (ref 26.0–34.0)
MCHC: 32.3 g/dL (ref 30.0–36.0)
MCV: 102 fL — ABNORMAL HIGH (ref 80.0–100.0)
Monocytes Absolute: 1 10*3/uL (ref 0.1–1.0)
Monocytes Relative: 8 %
Neutro Abs: 8.7 10*3/uL — ABNORMAL HIGH (ref 1.7–7.7)
Neutrophils Relative %: 73 %
Platelet Count: 500 10*3/uL — ABNORMAL HIGH (ref 150–400)
RBC: 3.91 MIL/uL (ref 3.87–5.11)
RDW: 13 % (ref 11.5–15.5)
WBC Count: 11.8 10*3/uL — ABNORMAL HIGH (ref 4.0–10.5)
nRBC: 0 % (ref 0.0–0.2)

## 2023-07-08 LAB — CMP (CANCER CENTER ONLY)
ALT: 16 U/L (ref 0–44)
AST: 21 U/L (ref 15–41)
Albumin: 4.1 g/dL (ref 3.5–5.0)
Alkaline Phosphatase: 54 U/L (ref 38–126)
Anion gap: 4 — ABNORMAL LOW (ref 5–15)
BUN: 10 mg/dL (ref 8–23)
CO2: 31 mmol/L (ref 22–32)
Calcium: 9.5 mg/dL (ref 8.9–10.3)
Chloride: 104 mmol/L (ref 98–111)
Creatinine: 0.68 mg/dL (ref 0.44–1.00)
GFR, Estimated: >60 mL/min (ref >60)
Glucose, Bld: 83 mg/dL (ref 70–99)
Potassium: 4.3 mmol/L (ref 3.5–5.1)
Sodium: 139 mmol/L (ref 135–145)
Total Bilirubin: 0.6 mg/dL (ref <1.2)
Total Protein: 6.2 g/dL — ABNORMAL LOW (ref 6.5–8.1)

## 2023-07-08 MED ORDER — SODIUM CHLORIDE 0.9 % IV SOLN: Freq: Once | INTRAVENOUS | Status: AC

## 2023-07-08 MED ORDER — DEXTROSE 5 % IV SOLN
70.0000 mg/m2 | Freq: Once | INTRAVENOUS | Status: AC
  Administered 2023-07-08: 120 mg via INTRAVENOUS
  Filled 2023-07-08: qty 60

## 2023-07-08 MED ORDER — SODIUM CHLORIDE 0.9 % IV SOLN
40.0000 mg | Freq: Once | INTRAVENOUS | Status: AC
  Administered 2023-07-08: 40 mg via INTRAVENOUS
  Filled 2023-07-08: qty 4

## 2023-07-08 NOTE — Progress Notes (Signed)
[facility] [phone]   [phone]  PROGRESS NOTE  Patient Care Team: Farris Has, MD as PCP - General (Family Medicine)  CHIEF COMPLAINTS/PURPOSE OF CONSULTATION:  IgG Lambda Multiple Myeloma  ONCOLOGIC HISTORY: 07/27/2018-09/2018: Received weekly velcade plus dexamethasone. Therapy changed due to poor response.  09/2018: Started Revlimid 25 mg for 21 days on, 7 days off of a 28 day cycle with dexamethasone. Therapy was held after 2 cycles because of severe dermatological toxicity 12/2018: Pomalyst 1 mg daily for 21 days.  She completed 4 cycles of therapy and therapy was held due to occurrence of dermatological toxicity.  07/2019: Started Ninlaro 4 mg weekly with dexamethasone 20 mg weekly for 3 weeks on and 1 week off. Daratumumab was addd in April 2021. Clearnce Sorrel was discontinued in November 2021. 05/18/2020: Started monthly daratumumab and transitioned to q 3 month schedule in April 2022.  11/06/2022: Darzalex discontinued due to progression of disease. M protein 0.9 with worsening lytic lesions in the skeleton.  11/20/2022: Cycle 1 Day 1 of Kyprolis/Dex therapy.  12/19/2022: Cycle 2 Day 1 of Kyprolis/Dex therapy 01/15/2023: Cycle 3 Day 1 of Kyprolis/Dex therapy 02/19/2023: Cycle 4 Day 1 of Kyprolis/Dex therapy 03/18/2023: Cycle 5 Day 1 of Kyprolis/Dex therapy 04/25/2023: Cycle 6 Day 1 of Kyprolis/Dex therapy 05/28/2023: Cycle 7 Day 1 of Kyprolis/Dex therapy 06/11/2023: Cycle 8 Day 1 of Kyprolis/Dex therapy 07/08/2023 Cycle 9 Day 1 of Kyprolis/Dex therapy  HISTORY OF PRESENTING ILLNESS:  Nicole Keith 82 y.o. female returns for a follow up for IgG lambda multiple myeloma. She was last seen on 05/14/2023. She presents today for Cycle 9 Day 1 of Kyprolis/Dex.   On exam today, Nicole Keith reports has had some "functional issues" in the interim since our last treatment.  She reports that she has been having some trouble with diarrhea as well as a rash.   She has been applying steroid cream which has been helping some.  She notes that she began taking Imodium which did help with the diarrhea that she was receiving from her Kyprolis.  She reports that overall her appetite has been good and she is eating well.  She reports her energy levels have been somewhat poor.  She notes that she is trying to be careful about what she eats because that can induce diarrhea.  Overall she is willing and able to proceed with treatment but at this time would prefer to take some days off, namely taking off 07/16/2023 as well as 08/06/2023.  We discussed the importance of continuing treatment as scheduled but given this is time around the holidays I was agreeable to discontinuing a few treatment days.  Overall she is willing and able to proceed with chemotherapy today.  She denies fevers, chills, sweats, shortness of breath, chest pain or cough. She has no other complaints. Rest of the 10 point ROS is below.  MEDICAL HISTORY:  Past Medical History:  Diagnosis Date   Allergic rhinitis    Arthritis    Colitis, collagenous    Diverticular disease    severe sig tics/fixation 2012   Family hx of colon cancer    Fracture of femoral neck, right (HCC) 01/09/2013   GERD (gastroesophageal reflux disease)    exacerbation of reflux-like symptoms 04/2011   Hiatal hernia    Hyperlipidemia    muliplt myelom 2020   Osteoarthritis of right hip 01/10/2013    SURGICAL HISTORY: Past Surgical History:  Procedure Laterality Date   CHOLECYSTECTOMY  EYE SURGERY  feb 2016   cataract right eye   LEG SURGERY Left    TONSILLECTOMY     TOTAL HIP ARTHROPLASTY Right 01/10/2013   Procedure: TOTAL HIP ARTHROPLASTY;  Surgeon: Eulas Post, MD;  Location: [facility] OR;  Service: Orthopedics;  Laterality: Right;    SOCIAL HISTORY: Social History   Socioeconomic History   Marital status: Married    Spouse name: Not on file   Number of children: 0   Years of education: Not on file   Highest  education level: Some college, no degree  Occupational History   Occupation: retired  Tobacco Use   Smoking status: Former    Current packs/day: 0.00    Types: Cigarettes    Quit date: 01/10/1972    Years since quitting: 51.5   Smokeless tobacco: Never   Tobacco comments:    quit 1973  Vaping Use   Vaping status: Never Used  Substance and Sexual Activity   Alcohol use: No   Drug use: No   Sexual activity: Not on file  Other Topics Concern   Not on file  Social History Narrative   Married, no children. Drinks 2 cups decaf coffee a day. Walks daily. Is active, plays golf, she and her husband go hiking frequently. 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   Social Determinants of Health   Financial Resource Strain: Not on file  Food Insecurity: No Food Insecurity (01/17/2023)   Hunger Vital Sign    Worried About Running Out of Food in the Last Year: Never true    Ran Out of Food in the Last Year: Never true  Transportation Needs: No Transportation Needs (01/17/2023)   PRAPARE - Administrator, Civil Service (Medical): No    Lack of Transportation (Non-Medical): No  Physical Activity: Not on file  Stress: Not on file  Social Connections: Not on file  Intimate Partner Violence: Not At Risk (01/17/2023)   Humiliation, Afraid, Rape, and Kick questionnaire    Fear of Current or Ex-Partner: No    Emotionally Abused: No    Physically Abused: No    Sexually Abused: No    FAMILY HISTORY: Family History  Problem Relation Age of Onset   Congestive Heart Failure Mother    Arthritis-Osteo Mother    Colon cancer Mother    Heart attack Father    Rheum arthritis Father    Colon cancer Maternal Aunt    Colon cancer Maternal Aunt    Hypothyroidism Sister     ALLERGIES:  is allergic to kyprolis [carfilzomib], pomalyst [pomalidomide], revlimid [lenalidomide], betadine [povidone iodine], flagyl [metronidazole], fosamax [alendronate sodium], bactrim  [sulfamethoxazole-trimethoprim], clindamycin/lincomycin, doxepin hcl, hydrocodone-acetaminophen, hydroxyzine, hydroxyzine hcl, tramadol hcl, valium [diazepam], actonel [risedronate sodium], boniva [ibandronic acid], doxycycline, and penicillins.  MEDICATIONS:  Current Outpatient Medications  Medication Sig Dispense Refill   acetaminophen (TYLENOL) 500 MG tablet Take 1,000 mg by mouth See admin instructions. Take 1,000 mg by mouth after breakfast, at 3:30 PM, and at 8:30 PM     acyclovir (ZOVIRAX) 400 MG tablet Take 1 tablet (400 mg total) by mouth 2 (two) times daily. (Patient not taking: Reported on 01/16/2023) 60 tablet 3   albuterol (VENTOLIN HFA) 108 (90 Base) MCG/ACT inhaler SMARTSIG:1 Puff(s) By Mouth Every 4-6 Hours PRN     famotidine (PEPCID) 20 MG tablet Take 1 tablet (20 mg total) by mouth daily. 30 tablet 0   famotidine-calcium carbonate-magnesium hydroxide (PEPCID COMPLETE) 10-800-165 MG chewable tablet Chew 1 tablet  by mouth daily as needed (for heartburn or indigestion).      guaiFENesin (MUCINEX) 600 MG 12 hr tablet Take 1 tablet (600 mg total) by mouth 2 (two) times daily. 40 tablet 0   loperamide (IMODIUM) 2 MG capsule Take 1 capsule (2 mg total) by mouth 3 (three) times daily between meals as needed for diarrhea or loose stools. 30 capsule 1   triamcinolone cream (KENALOG) 0.1 % Apply 1 Application topically 2 (two) times daily. (Patient taking differently: Apply 1 Application topically 2 (two) times daily as needed (for allergic flares- affected areas).) 453.6 g 2   VASELINE PURE ULTRA WHITE ointment Apply 1 application  topically See admin instructions. Apply to the genitalia in the morning and at bedtime     No current facility-administered medications for this visit.    REVIEW OF SYSTEMS:   Constitutional: ( - ) fevers, ( - )  chills , ( - ) night sweats Eyes: ( - ) blurriness of vision, ( - ) double vision, ( - ) watery eyes Ears, nose, mouth, throat, and face: ( - )  mucositis, ( - ) sore throat Respiratory: ( - ) cough, ( - ) dyspnea, ( - ) wheezes Cardiovascular: ( - ) palpitation, ( - ) chest discomfort, ( - ) lower extremity swelling Gastrointestinal:  ( - ) nausea, ( - ) heartburn, ( - ) change in bowel habits Skin: ( +) abnormal skin rashes Lymphatics: ( - ) new lymphadenopathy, ( - ) easy bruising Neurological: ( - ) numbness, ( - ) tingling, ( - ) new weaknesses Behavioral/Psych: ( - ) mood change, ( - ) new changes  All other systems were reviewed with the patient and are negative.  PHYSICAL EXAMINATION: ECOG PERFORMANCE STATUS: 1 - Symptomatic but completely ambulatory  There were no vitals filed for this visit. There were no vitals filed for this visit.  GENERAL: well appearing female in NAD  SKIN: skin color, texture, turgor are normal. Diffuse, flat erythematous rash.  EYES: conjunctiva are pink and non-injected, sclera clear LYMPH:  no palpable lymphadenopathy in the cervical or supraclavicular lymph nodes.  LUNGS: clear to auscultation and percussion with normal breathing effort HEART: regular rate & rhythm and no murmurs and no lower extremity edema Musculoskeletal: no cyanosis of digits and no clubbing  PSYCH: alert & oriented x 3, fluent speech NEURO: no focal motor/sensory deficits  LABORATORY DATA:  I have reviewed the data as listed    Latest Ref Rng & Units 07/08/2023   10:38 AM 06/24/2023   10:54 AM 06/11/2023    9:55 AM  CBC  WBC 4.0 - 10.5 K/uL 11.8  11.3  8.2   Hemoglobin 12.0 - 15.0 g/dL 40.9  81.1  91.4   Hematocrit 36.0 - 46.0 % 39.9  37.9  37.8   Platelets 150 - 400 K/uL 500  401  571        Latest Ref Rng & Units 07/08/2023   10:38 AM 06/24/2023   10:54 AM 06/11/2023    9:55 AM  CMP  Glucose 70 - 99 mg/dL 83  782  956   BUN 8 - 23 mg/dL 10  11  10    Creatinine 0.44 - 1.00 mg/dL 2.13  0.86  5.78   Sodium 135 - 145 mmol/L 139  138  139   Potassium 3.5 - 5.1 mmol/L 4.3  3.9  4.5   Chloride 98 - 111  mmol/L 104  104  105   CO2  22 - 32 mmol/L 31  29  28    Calcium 8.9 - 10.3 mg/dL 9.5  8.9  8.9   Total Protein 6.5 - 8.1 g/dL 6.2  5.7  5.6   Total Bilirubin <1.2 mg/dL 0.6  0.5  0.6   Alkaline Phos 38 - 126 U/L 54  51  52   AST 15 - 41 U/L 21  17  19    ALT 0 - 44 U/L 16  12  16      ASSESSMENT & PLAN ALTAIR POLLMANN is a 82 y.o.female who presents to the clinic for follow up for multiple myeloma.  #IgG Lambda Multiple Myeloma: --Initially presented in April 2016 with M spike of 3.2 g/dL and IgG level of 1610. Underwent bone marrow biopsy from 11/20/2014 show 29% plasma cells. 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  --In December 2019 with rising M spike of 5.9 g/dL and worsening anemia, with Hgb 8.7, she met criteria for transformation of active multiple myeloma.  --From 07/27/2018-09/2018, received weekly velcade plus dexamethasone. Therapy changed due to poor response.  --In 09/2018, started Revlimid 25 mg for 21 days on, 7 days off of a 28 day cycle with dexamethasone. Therapy was held after 2 cycles because of severe dermatological toxicity --In 12/2018, switched to Pomalyst 1 mg daily for 21 days.  She completed 4 cycles of therapy and therapy was held due to occurrence of dermatological toxicity.  --In 07/2019, switch to Ninlaro 4 mg weekly with dexamethasone 20 mg weekly for 3 weeks on and 1 week off. Daratumumab was addd in April 2021. Clearnce Sorrel was discontinued in November 2021. --On 05/18/2020, started monthly daratumumab and transitioned to q 3 month schedule in April 2022.  --On 11/20/2022, switch to Kyprolis/Dex due to rising M-protein and bone survey that showed worsening lytic lesions.  PLAN:   --Labs today show white blood cell count 11.8, hemoglobin 12.9, MCV 102, platelets 500, Creatinine and LFTs are normal.  --Most recent myeloma labs from 05/28/2023 showed improvement of M protein to 0.4 --Due for Cycle 9 Day 1 of Kyprolis/Dex. Proceed without any dose  modifications. Discussed that M protein needs to improve, closer to 0.2-0.3 before making any adjustments to the frequency of her treatment. I advised patient that weekly Kyprolis/Dex is standard of care.  --Underwent metastatic survey on 02/20/2023, appears to have stable lytic lesions from prior. --RTC in 2 weeks for continuation of Kyprolis/Dex with continued weekly treatments.   #Diffuse erythematous rash-stable --Managed with kenalog cream. -- nystatin powder for groin area.  --Discussed referral to dermatology, patient deferred at this time.   #Supportive Care -- port not required but can be placed if requested.  -- zofran 8mg  q8H PRN and compazine 10mg  PO q6H for nausea -- acyclovir 400mg  PO BID for VCZ prophylaxis -- no pain medication required at this time.  -- discussed role for zometa therapy in the setting her lytic lesions. Explained zometa is given q 12 weeks and requires a dental clearance.  After discussing this with the patient further today she knows she would like to hold on this for the time being as she is afraid of the side effects.  Strongly emphasized that she should reconsider as this will be helpful with her lytic bone lesions.  No orders of the defined types were placed in this encounter.  All questions were answered. The patient knows to call the clinic with any problems, questions or concerns.  I have spent a total of 30 minutes minutes of  face-to-face and non-face-to-face time, preparing to see the patient,  performing a medically appropriate examination, counseling and educating the patient, ordering medications/tests/procedures,documenting clinical information in the electronic health record, and care coordination.   Ulysees Barns, MD Department of Hematology/Oncology [facility] at [facility] Phone: [phone] Pager: [phone] Email: [email or web address]

## 2023-07-08 NOTE — Patient Instructions (Signed)
[facility] [place] [facility] - A DEPT OF [facility]  Discharge Instructions: Thank you for choosing [facility] to provide your oncology and hematology care.   If you have a lab appointment with the Cancer Center, please go directly to the Cancer Center and check in at the registration area.   Wear comfortable clothing and clothing appropriate for easy access to any Portacath or PICC line.   We strive to give you quality time with your provider. You may need to reschedule your appointment if you arrive late (15 or more minutes).  Arriving late affects you and other patients whose appointments are after yours.  Also, if you miss three or more appointments without notifying the office, you may be dismissed from the clinic at the provider's discretion.      For prescription refill requests, have your pharmacy contact our office and allow 72 hours for refills to be completed.    Today you received the following chemotherapy and/or immunotherapy agents: carfilzomib      To help prevent nausea and vomiting after your treatment, we encourage you to take your nausea medication as directed.  BELOW ARE SYMPTOMS THAT SHOULD BE REPORTED IMMEDIATELY: *FEVER GREATER THAN 100.4 F (38 C) OR HIGHER *CHILLS OR SWEATING *NAUSEA AND VOMITING THAT IS NOT CONTROLLED WITH YOUR NAUSEA MEDICATION *UNUSUAL SHORTNESS OF BREATH *UNUSUAL BRUISING OR BLEEDING *URINARY PROBLEMS (pain or burning when urinating, or frequent urination) *BOWEL PROBLEMS (unusual diarrhea, constipation, pain near the anus) TENDERNESS IN MOUTH AND THROAT WITH OR WITHOUT PRESENCE OF ULCERS (sore throat, sores in mouth, or a toothache) UNUSUAL RASH, SWELLING OR PAIN  UNUSUAL VAGINAL DISCHARGE OR ITCHING   Items with * indicate a potential emergency and should be followed up as soon as possible or go to the Emergency Department if any problems should occur.  Please show the CHEMOTHERAPY ALERT CARD or  IMMUNOTHERAPY ALERT CARD at check-in to the Emergency Department and triage nurse.  Should you have questions after your visit or need to cancel or reschedule your appointment, please contact [facility] [facility] - A DEPT OF [facility]  Dept: [phone]  and follow the prompts.  Office hours are 8:00 a.m. to 4:30 p.m. Monday - Friday. Please note that voicemails left after 4:00 p.m. may not be returned until the following business day.  We are closed weekends and major holidays. You have access to a nurse at all times for urgent questions. Please call the main number to the clinic Dept: [phone] and follow the prompts.   For any non-urgent questions, you may also contact your provider using MyChart. We now offer e-Visits for anyone 55 and older to request care online for non-urgent symptoms. For details visit [email or web address].   Also download the MyChart app! Go to the app store, search "MyChart", open the app, select [place], and log in with your MyChart username and password.

## 2023-07-09 LAB — KAPPA/LAMBDA LIGHT CHAINS
Kappa free light chain: 2 mg/L — ABNORMAL LOW (ref 3.3–19.4)
Kappa, lambda light chain ratio: 0.22 — ABNORMAL LOW (ref 0.26–1.65)
Lambda free light chains: 9.3 mg/L (ref 5.7–26.3)

## 2023-07-15 ENCOUNTER — Other Ambulatory Visit: Payer: Medicare Other

## 2023-07-15 ENCOUNTER — Ambulatory Visit: Payer: Medicare Other

## 2023-07-15 ENCOUNTER — Ambulatory Visit: Payer: Medicare Other | Admitting: Nurse Practitioner

## 2023-07-15 LAB — MULTIPLE MYELOMA PANEL, SERUM
Albumin SerPl Elph-Mcnc: 3.5 g/dL (ref 2.9–4.4)
Albumin/Glob SerPl: 1.6 (ref 0.7–1.7)
Alpha 1: 0.3 g/dL (ref 0.0–0.4)
Alpha2 Glob SerPl Elph-Mcnc: 0.6 g/dL (ref 0.4–1.0)
B-Globulin SerPl Elph-Mcnc: 0.8 g/dL (ref 0.7–1.3)
Gamma Glob SerPl Elph-Mcnc: 0.5 g/dL (ref 0.4–1.8)
Globulin, Total: 2.2 g/dL (ref 2.2–3.9)
IgA: 12 mg/dL — ABNORMAL LOW (ref 64–422)
IgG (Immunoglobin G), Serum: 526 mg/dL — ABNORMAL LOW (ref 586–1602)
IgM (Immunoglobulin M), Srm: 7 mg/dL — ABNORMAL LOW (ref 26–217)
M Protein SerPl Elph-Mcnc: 0.3 g/dL — ABNORMAL HIGH
Total Protein ELP: 5.7 g/dL — ABNORMAL LOW (ref 6.0–8.5)

## 2023-07-16 ENCOUNTER — Other Ambulatory Visit: Payer: Medicare Other

## 2023-07-16 ENCOUNTER — Ambulatory Visit: Payer: Medicare Other

## 2023-07-23 ENCOUNTER — Ambulatory Visit: Payer: Medicare Other

## 2023-07-23 ENCOUNTER — Inpatient Hospital Stay (HOSPITAL_BASED_OUTPATIENT_CLINIC_OR_DEPARTMENT_OTHER): Payer: Medicare Other | Admitting: Hematology and Oncology

## 2023-07-23 ENCOUNTER — Inpatient Hospital Stay: Payer: Medicare Other

## 2023-07-23 ENCOUNTER — Other Ambulatory Visit: Payer: Medicare Other

## 2023-07-23 ENCOUNTER — Inpatient Hospital Stay: Payer: Medicare Other | Attending: Physician Assistant

## 2023-07-23 VITALS — BP 142/76 | HR 61 | Temp 98.2°F | Resp 13 | Wt 123.6 lb

## 2023-07-23 DIAGNOSIS — Z79899 Other long term (current) drug therapy: Secondary | ICD-10-CM | POA: Insufficient documentation

## 2023-07-23 DIAGNOSIS — R21 Rash and other nonspecific skin eruption: Secondary | ICD-10-CM

## 2023-07-23 DIAGNOSIS — C9000 Multiple myeloma not having achieved remission: Secondary | ICD-10-CM | POA: Diagnosis not present

## 2023-07-23 DIAGNOSIS — Z5112 Encounter for antineoplastic immunotherapy: Secondary | ICD-10-CM | POA: Diagnosis not present

## 2023-07-23 LAB — CMP (CANCER CENTER ONLY)
ALT: 18 U/L (ref 0–44)
AST: 21 U/L (ref 15–41)
Albumin: 3.9 g/dL (ref 3.5–5.0)
Alkaline Phosphatase: 54 U/L (ref 38–126)
Anion gap: 6 (ref 5–15)
BUN: 14 mg/dL (ref 8–23)
CO2: 29 mmol/L (ref 22–32)
Calcium: 9.1 mg/dL (ref 8.9–10.3)
Chloride: 104 mmol/L (ref 98–111)
Creatinine: 0.67 mg/dL (ref 0.44–1.00)
GFR, Estimated: >60 mL/min (ref >60)
Glucose, Bld: 107 mg/dL — ABNORMAL HIGH (ref 70–99)
Potassium: 3.7 mmol/L (ref 3.5–5.1)
Sodium: 139 mmol/L (ref 135–145)
Total Bilirubin: 0.6 mg/dL (ref <1.2)
Total Protein: 5.9 g/dL — ABNORMAL LOW (ref 6.5–8.1)

## 2023-07-23 LAB — CBC WITH DIFFERENTIAL (CANCER CENTER ONLY)
Abs Immature Granulocytes: 0.04 10*3/uL (ref 0.00–0.07)
Basophils Absolute: 0.1 10*3/uL (ref 0.0–0.1)
Basophils Relative: 1 %
Eosinophils Absolute: 0.6 10*3/uL — ABNORMAL HIGH (ref 0.0–0.5)
Eosinophils Relative: 6 %
HCT: 38.5 % (ref 36.0–46.0)
Hemoglobin: 12.9 g/dL (ref 12.0–15.0)
Immature Granulocytes: 0 %
Lymphocytes Relative: 18 %
Lymphs Abs: 1.9 10*3/uL (ref 0.7–4.0)
MCH: 34.2 pg — ABNORMAL HIGH (ref 26.0–34.0)
MCHC: 33.5 g/dL (ref 30.0–36.0)
MCV: 102.1 fL — ABNORMAL HIGH (ref 80.0–100.0)
Monocytes Absolute: 0.9 10*3/uL (ref 0.1–1.0)
Monocytes Relative: 8 %
Neutro Abs: 7.1 10*3/uL (ref 1.7–7.7)
Neutrophils Relative %: 67 %
Platelet Count: 484 10*3/uL — ABNORMAL HIGH (ref 150–400)
RBC: 3.77 MIL/uL — ABNORMAL LOW (ref 3.87–5.11)
RDW: 12.6 % (ref 11.5–15.5)
WBC Count: 10.6 10*3/uL — ABNORMAL HIGH (ref 4.0–10.5)
nRBC: 0 % (ref 0.0–0.2)

## 2023-07-23 MED ORDER — SODIUM CHLORIDE 0.9 % IV SOLN
40.0000 mg | Freq: Once | INTRAVENOUS | Status: AC
  Administered 2023-07-23: 40 mg via INTRAVENOUS
  Filled 2023-07-23: qty 4

## 2023-07-23 MED ORDER — SODIUM CHLORIDE 0.9 % IV SOLN: Freq: Once | INTRAVENOUS | Status: AC

## 2023-07-23 MED ORDER — DEXTROSE 5 % IV SOLN
70.0000 mg/m2 | Freq: Once | INTRAVENOUS | Status: AC
  Administered 2023-07-23: 120 mg via INTRAVENOUS
  Filled 2023-07-23: qty 60

## 2023-07-23 NOTE — Progress Notes (Signed)
[facility] [phone]   [phone]  PROGRESS NOTE  Patient Care Team: Farris Has, MD as PCP - General (Family Medicine)  CHIEF COMPLAINTS/PURPOSE OF CONSULTATION:  IgG Lambda Multiple Myeloma  ONCOLOGIC HISTORY: 07/27/2018-09/2018: Received weekly velcade plus dexamethasone. Therapy changed due to poor response.  09/2018: Started Revlimid 25 mg for 21 days on, 7 days off of a 28 day cycle with dexamethasone. Therapy was held after 2 cycles because of severe dermatological toxicity 12/2018: Pomalyst 1 mg daily for 21 days.  She completed 4 cycles of therapy and therapy was held due to occurrence of dermatological toxicity.  07/2019: Started Ninlaro 4 mg weekly with dexamethasone 20 mg weekly for 3 weeks on and 1 week off. Daratumumab was addd in April 2021. Clearnce Sorrel was discontinued in November 2021. 05/18/2020: Started monthly daratumumab and transitioned to q 3 month schedule in April 2022.  11/06/2022: Darzalex discontinued due to progression of disease. M protein 0.9 with worsening lytic lesions in the skeleton.  11/20/2022: Cycle 1 Day 1 of Kyprolis/Dex therapy.  12/19/2022: Cycle 2 Day 1 of Kyprolis/Dex therapy 01/15/2023: Cycle 3 Day 1 of Kyprolis/Dex therapy 02/19/2023: Cycle 4 Day 1 of Kyprolis/Dex therapy 03/18/2023: Cycle 5 Day 1 of Kyprolis/Dex therapy 04/25/2023: Cycle 6 Day 1 of Kyprolis/Dex therapy 05/28/2023: Cycle 7 Day 1 of Kyprolis/Dex therapy 06/11/2023: Cycle 8 Day 1 of Kyprolis/Dex therapy 07/08/2023 Cycle 9 Day 1 of Kyprolis/Dex therapy  HISTORY OF PRESENTING ILLNESS:  Nicole Keith 82 y.o. female returns for a follow up for IgG lambda multiple myeloma. She was last seen on 05/14/2023. She presents today for Cycle 9 Day 1 of Kyprolis/Dex.   On exam today, [name] reports she continues to have rash across the upper extremities which is causing her to have "a tough time".  She reports that she feels very beat up after her last  treatment and does not wish to proceed with treatment today.  She does have some occasional dizzy spells as well as diarrhea.  She reports that she takes Imodium it does help to resolve the symptoms.  It is not associated with any vomiting or diarrhea.  She is not having any lightheadedness or dizziness.  She reports that she does have difficulty with sleep particularly on treatment days.  She would like to hold her treatment today and would like to space out her treatments as much as is feasible..  She denies fevers, chills, sweats, shortness of breath, chest pain or cough. She has no other complaints. Rest of the 10 point ROS is below.  Today we had discussion about the risks and benefits of canceling appointments and skipping treatments.  She notes that she prioritizes her quality of life and is aware that the disease may get out of hand/control without following the scheduled regimen.  She notes that her quality of life is what is most important to her at this time.  We will respect her wishes and give her limited treatment spaced out per her requests.  MEDICAL HISTORY:  Past Medical History:  Diagnosis Date   Allergic rhinitis    Arthritis    Colitis, collagenous    Diverticular disease    severe sig tics/fixation 2012   Family hx of colon cancer    Fracture of femoral neck, right (HCC) 01/09/2013   GERD (gastroesophageal reflux disease)    exacerbation of reflux-like symptoms 04/2011   Hiatal hernia    Hyperlipidemia    muliplt myelom 2020   Osteoarthritis of  right hip 01/10/2013    SURGICAL HISTORY: Past Surgical History:  Procedure Laterality Date   CHOLECYSTECTOMY     EYE SURGERY  feb 2016   cataract right eye   LEG SURGERY Left    TONSILLECTOMY     TOTAL HIP ARTHROPLASTY Right 01/10/2013   Procedure: TOTAL HIP ARTHROPLASTY;  Surgeon: Eulas Post, MD;  Location: [facility] OR;  Service: Orthopedics;  Laterality: Right;    SOCIAL HISTORY: Social History   Socioeconomic History    Marital status: Married    Spouse name: Not on file   Number of children: 0   Years of education: Not on file   Highest education level: Some college, no degree  Occupational History   Occupation: retired  Tobacco Use   Smoking status: Former    Current packs/day: 0.00    Types: Cigarettes    Quit date: 01/10/1972    Years since quitting: 51.5   Smokeless tobacco: Never   Tobacco comments:    quit 1973  Vaping Use   Vaping status: Never Used  Substance and Sexual Activity   Alcohol use: No   Drug use: No   Sexual activity: Not on file  Other Topics Concern   Not on file  Social History Narrative   Married, no children. Drinks 2 cups decaf coffee a day. Walks daily. Is active, plays golf, she and her husband go hiking frequently. 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   Social Drivers of Corporate investment banker Strain: Not on file  Food Insecurity: No Food Insecurity (01/17/2023)   Hunger Vital Sign    Worried About Running Out of Food in the Last Year: Never true    Ran Out of Food in the Last Year: Never true  Transportation Needs: No Transportation Needs (01/17/2023)   PRAPARE - Administrator, Civil Service (Medical): No    Lack of Transportation (Non-Medical): No  Physical Activity: Not on file  Stress: Not on file  Social Connections: Not on file  Intimate Partner Violence: Not At Risk (01/17/2023)   Humiliation, Afraid, Rape, and Kick questionnaire    Fear of Current or Ex-Partner: No    Emotionally Abused: No    Physically Abused: No    Sexually Abused: No    FAMILY HISTORY: Family History  Problem Relation Age of Onset   Congestive Heart Failure Mother    Arthritis-Osteo Mother    Colon cancer Mother    Heart attack Father    Rheum arthritis Father    Colon cancer Maternal Aunt    Colon cancer Maternal Aunt    Hypothyroidism Sister     ALLERGIES:  is allergic to kyprolis [carfilzomib], pomalyst [pomalidomide], revlimid  [lenalidomide], betadine [povidone iodine], flagyl [metronidazole], fosamax [alendronate sodium], bactrim [sulfamethoxazole-trimethoprim], clindamycin/lincomycin, doxepin hcl, hydrocodone-acetaminophen, hydroxyzine, hydroxyzine hcl, tramadol hcl, valium [diazepam], actonel [risedronate sodium], boniva [ibandronic acid], doxycycline, and penicillins.  MEDICATIONS:  Current Outpatient Medications  Medication Sig Dispense Refill   acetaminophen (TYLENOL) 500 MG tablet Take 1,000 mg by mouth See admin instructions. Take 1,000 mg by mouth after breakfast, at 3:30 PM, and at 8:30 PM     acyclovir (ZOVIRAX) 400 MG tablet Take 1 tablet (400 mg total) by mouth 2 (two) times daily. (Patient not taking: Reported on 01/16/2023) 60 tablet 3   albuterol (VENTOLIN HFA) 108 (90 Base) MCG/ACT inhaler SMARTSIG:1 Puff(s) By Mouth Every 4-6 Hours PRN     famotidine (PEPCID) 20 MG tablet Take 1 tablet (20  mg total) by mouth daily. 30 tablet 0   famotidine-calcium carbonate-magnesium hydroxide (PEPCID COMPLETE) 10-800-165 MG chewable tablet Chew 1 tablet by mouth daily as needed (for heartburn or indigestion).      guaiFENesin (MUCINEX) 600 MG 12 hr tablet Take 1 tablet (600 mg total) by mouth 2 (two) times daily. 40 tablet 0   loperamide (IMODIUM) 2 MG capsule Take 1 capsule (2 mg total) by mouth 3 (three) times daily between meals as needed for diarrhea or loose stools. 30 capsule 1   triamcinolone cream (KENALOG) 0.1 % Apply 1 Application topically 2 (two) times daily. (Patient taking differently: Apply 1 Application topically 2 (two) times daily as needed (for allergic flares- affected areas).) 453.6 g 2   VASELINE PURE ULTRA WHITE ointment Apply 1 application  topically See admin instructions. Apply to the genitalia in the morning and at bedtime     No current facility-administered medications for this visit.    REVIEW OF SYSTEMS:   Constitutional: ( - ) fevers, ( - )  chills , ( - ) night sweats Eyes: ( - )  blurriness of vision, ( - ) double vision, ( - ) watery eyes Ears, nose, mouth, throat, and face: ( - ) mucositis, ( - ) sore throat Respiratory: ( - ) cough, ( - ) dyspnea, ( - ) wheezes Cardiovascular: ( - ) palpitation, ( - ) chest discomfort, ( - ) lower extremity swelling Gastrointestinal:  ( - ) nausea, ( - ) heartburn, ( - ) change in bowel habits Skin: ( +) abnormal skin rashes Lymphatics: ( - ) new lymphadenopathy, ( - ) easy bruising Neurological: ( - ) numbness, ( - ) tingling, ( - ) new weaknesses Behavioral/Psych: ( - ) mood change, ( - ) new changes  All other systems were reviewed with the patient and are negative.  PHYSICAL EXAMINATION: ECOG PERFORMANCE STATUS: 1 - Symptomatic but completely ambulatory  Vitals:   07/23/23 1306  BP: (!) 142/76  Pulse: 61  Resp: 13  Temp: 98.2 F (36.8 C)  SpO2: 100%   Filed Weights   07/23/23 1306  Weight: 123 lb 9.6 oz (56.1 kg)    GENERAL: well appearing female in NAD  SKIN: skin color, texture, turgor are normal. Diffuse, flat erythematous rash.  EYES: conjunctiva are pink and non-injected, sclera clear LYMPH:  no palpable lymphadenopathy in the cervical or supraclavicular lymph nodes.  LUNGS: clear to auscultation and percussion with normal breathing effort HEART: regular rate & rhythm and no murmurs and no lower extremity edema Musculoskeletal: no cyanosis of digits and no clubbing  PSYCH: alert & oriented x 3, fluent speech NEURO: no focal motor/sensory deficits  LABORATORY DATA:  I have reviewed the data as listed    Latest Ref Rng & Units 07/23/2023   12:06 PM 07/08/2023   10:38 AM 06/24/2023   10:54 AM  CBC  WBC 4.0 - 10.5 K/uL 10.6  11.8  11.3   Hemoglobin 12.0 - 15.0 g/dL 19.1  47.8  29.5   Hematocrit 36.0 - 46.0 % 38.5  39.9  37.9   Platelets 150 - 400 K/uL 484  500  401        Latest Ref Rng & Units 07/23/2023   12:06 PM 07/08/2023   10:38 AM 06/24/2023   10:54 AM  CMP  Glucose 70 - 99 mg/dL 621   83  308   BUN 8 - 23 mg/dL 14  10  11    Creatinine 0.44 -  1.00 mg/dL 4.09  8.11  9.14   Sodium 135 - 145 mmol/L 139  139  138   Potassium 3.5 - 5.1 mmol/L 3.7  4.3  3.9   Chloride 98 - 111 mmol/L 104  104  104   CO2 22 - 32 mmol/L 29  31  29    Calcium 8.9 - 10.3 mg/dL 9.1  9.5  8.9   Total Protein 6.5 - 8.1 g/dL 5.9  6.2  5.7   Total Bilirubin <1.2 mg/dL 0.6  0.6  0.5   Alkaline Phos 38 - 126 U/L 54  54  51   AST 15 - 41 U/L 21  21  17    ALT 0 - 44 U/L 18  16  12      ASSESSMENT & PLAN REBECCALYNN CAI is a 82 y.o.female who presents to the clinic for follow up for multiple myeloma.  #IgG Lambda Multiple Myeloma: --Initially presented in April 2016 with M spike of 3.2 g/dL and IgG level of 7829. Underwent bone marrow biopsy from 11/20/2014 show 29% plasma cells. 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  --In December 2019 with rising M spike of 5.9 g/dL and worsening anemia, with Hgb 8.7, she met criteria for transformation of active multiple myeloma.  --From 07/27/2018-09/2018, received weekly velcade plus dexamethasone. Therapy changed due to poor response.  --In 09/2018, started Revlimid 25 mg for 21 days on, 7 days off of a 28 day cycle with dexamethasone. Therapy was held after 2 cycles because of severe dermatological toxicity --In 12/2018, switched to Pomalyst 1 mg daily for 21 days.  She completed 4 cycles of therapy and therapy was held due to occurrence of dermatological toxicity.  --In 07/2019, switch to Ninlaro 4 mg weekly with dexamethasone 20 mg weekly for 3 weeks on and 1 week off. Daratumumab was addd in April 2021. Clearnce Sorrel was discontinued in November 2021. --On 05/18/2020, started monthly daratumumab and transitioned to q 3 month schedule in April 2022.  --On 11/20/2022, switch to Kyprolis/Dex due to rising M-protein and bone survey that showed worsening lytic lesions.  PLAN:   --Labs today show white blood cell count 10.6, hemoglobin 12.9, MCV 102.1,  platelets 484. creatinine and LFTs are normal.  --Most recent myeloma labs from 05/28/2023 showed improvement of M protein to 0.4 --Due for Cycle 9 Day 1 of Kyprolis/Dex. Proceed without any dose modifications. Discussed that M protein needs to improve, closer to 0.2-0.3 before making any adjustments to the frequency of her treatment. I advised patient that weekly Kyprolis/Dex is standard of care.  --Patient is clear with her wishes that she wants to prioritize her quality of life and would prefer to skip doses when she is feeling unwell and when she feels like she has had too much treatment.  I will respect her wishes but continue to encourage her to receive the full treatment as scheduled. --Underwent metastatic survey on 02/20/2023, appears to have stable lytic lesions from prior. --RTC in 2 weeks for continuation of Kyprolis/Dex with continued weekly treatments.   #Diffuse erythematous rash-stable --Managed with kenalog cream. -- nystatin powder for groin area.  --Discussed referral to dermatology, patient deferred at this time.   #Supportive Care -- port not required but can be placed if requested.  -- zofran 8mg  q8H PRN and compazine 10mg  PO q6H for nausea -- acyclovir 400mg  PO BID for VCZ prophylaxis -- no pain medication required at this time.  -- discussed role for zometa therapy in the setting her lytic  lesions. Explained zometa is given q 12 weeks and requires a dental clearance.  After discussing this with the patient further today she knows she would like to hold on this for the time being as she is afraid of the side effects.  Strongly emphasized that she should reconsider as this will be helpful with her lytic bone lesions.  No orders of the defined types were placed in this encounter.  All questions were answered. The patient knows to call the clinic with any problems, questions or concerns.  I have spent a total of 30 minutes minutes of face-to-face and non-face-to-face time,  preparing to see the patient,  performing a medically appropriate examination, counseling and educating the patient, ordering medications/tests/procedures,documenting clinical information in the electronic health record, and care coordination.   Ulysees Barns, MD Department of Hematology/Oncology [facility] Phone: [phone] Pager: [phone] Email: [email or web address]

## 2023-07-23 NOTE — Progress Notes (Signed)
[facility] Clinical Social Work  Clinical Social Work was referred by nurse for assistance with concerns with received bills.  Clinical Social Worker met with patient to offer support and assess for needs.  Patient discussed enrollment in [employer] and concerned that financial assistance was not being applied to account appropriately.  Patient provided CSW with two bills that patient is concerned about.  Csw and patient reviewed statements. CSW will attempt to determine if grants are being applied and reason for credits to account. Patient verbalized understanding.  Marguerita Merles, LCSW  Clinical Social Worker [facility]

## 2023-07-23 NOTE — Patient Instructions (Signed)
 [facility] - A DEPT OF [facility]  Discharge Instructions: Thank you for choosing [facility] to provide your oncology and hematology care.   If you have a lab appointment with the Cancer Center, please go directly to the Cancer Center and check in at the registration area.   Wear comfortable clothing and clothing appropriate for easy access to any Portacath or PICC line.   We strive to give you quality time with your provider. You may need to reschedule your appointment if you arrive late (15 or more minutes).  Arriving late affects you and other patients whose appointments are after yours.  Also, if you miss three or more appointments without notifying the office, you may be dismissed from the clinic at the provider's discretion.      For prescription refill requests, have your pharmacy contact our office and allow 72 hours for refills to be completed.    Today you received the following chemotherapy and/or immunotherapy agents: Kyprolis      To help prevent nausea and vomiting after your treatment, we encourage you to take your nausea medication as directed.  BELOW ARE SYMPTOMS THAT SHOULD BE REPORTED IMMEDIATELY: *FEVER GREATER THAN 100.4 F (38 C) OR HIGHER *CHILLS OR SWEATING *NAUSEA AND VOMITING THAT IS NOT CONTROLLED WITH YOUR NAUSEA MEDICATION *UNUSUAL SHORTNESS OF BREATH *UNUSUAL BRUISING OR BLEEDING *URINARY PROBLEMS (pain or burning when urinating, or frequent urination) *BOWEL PROBLEMS (unusual diarrhea, constipation, pain near the anus) TENDERNESS IN MOUTH AND THROAT WITH OR WITHOUT PRESENCE OF ULCERS (sore throat, sores in mouth, or a toothache) UNUSUAL RASH, SWELLING OR PAIN  UNUSUAL VAGINAL DISCHARGE OR ITCHING   Items with * indicate a potential emergency and should be followed up as soon as possible or go to the Emergency Department if any problems should occur.  Please show the CHEMOTHERAPY ALERT CARD or IMMUNOTHERAPY  ALERT CARD at check-in to the Emergency Department and triage nurse.  Should you have questions after your visit or need to cancel or reschedule your appointment, please contact [facility] - A DEPT OF [facility]  Dept: [phone]  and follow the prompts.  Office hours are 8:00 a.m. to 4:30 p.m. Monday - Friday. Please note that voicemails left after 4:00 p.m. may not be returned until the following business day.  We are closed weekends and major holidays. You have access to a nurse at all times for urgent questions. Please call the main number to the clinic Dept: [phone] and follow the prompts.   For any non-urgent questions, you may also contact your provider using MyChart. We now offer e-Visits for anyone 73 and older to request care online for non-urgent symptoms. For details visit [email or web address].   Also download the MyChart app! Go to the app store, search "MyChart", open the app, select [place], and log in with your MyChart username and password.

## 2023-07-24 ENCOUNTER — Encounter: Payer: Self-pay | Admitting: Hematology and Oncology

## 2023-07-24 NOTE — Progress Notes (Signed)
Called patient back again on landline and she answered. Patient states she was able to get in contact with a Megan w/ [facility] and she has no additional needs.

## 2023-07-24 NOTE — Progress Notes (Signed)
Left voicemail on patient's [phone] number for her to return my call regarding her billing questions and being referred to [facility] whom handles her copay assistance. Left my return name and number of [phone].

## 2023-07-30 ENCOUNTER — Other Ambulatory Visit: Payer: Self-pay

## 2023-07-30 ENCOUNTER — Other Ambulatory Visit: Payer: Medicare Other

## 2023-07-30 ENCOUNTER — Ambulatory Visit: Payer: Medicare Other

## 2023-07-31 ENCOUNTER — Ambulatory Visit: Payer: Medicare Other | Admitting: Hematology and Oncology

## 2023-07-31 ENCOUNTER — Other Ambulatory Visit: Payer: Medicare Other

## 2023-07-31 ENCOUNTER — Ambulatory Visit: Payer: Medicare Other

## 2023-07-31 ENCOUNTER — Other Ambulatory Visit: Payer: Self-pay

## 2023-08-02 ENCOUNTER — Encounter: Payer: Self-pay | Admitting: Hematology and Oncology

## 2023-08-06 ENCOUNTER — Other Ambulatory Visit: Payer: Medicare Other

## 2023-08-06 ENCOUNTER — Ambulatory Visit: Payer: Medicare Other

## 2023-08-06 ENCOUNTER — Ambulatory Visit: Payer: Medicare Other | Admitting: Hematology and Oncology

## 2023-08-07 ENCOUNTER — Other Ambulatory Visit: Payer: Medicare Other

## 2023-08-07 ENCOUNTER — Ambulatory Visit: Payer: Medicare Other

## 2023-08-10 ENCOUNTER — Telehealth: Payer: Self-pay

## 2023-08-10 NOTE — Telephone Encounter (Signed)
[facility] CSW Progress Note  Encounter in patient's chart on 12/23 was written in error, please disregard.   CSW attempted to reach patient regarding questions on 12/12 about her medical bills. CSW left vm for patient informing her that [employer] is still applying credits to her account, which could have led to received refund checks. CSW was unable to identify when/how much the credits were, but informed her to contact the company if needed.  Marguerita Merles, LCSW Clinical Social Worker [facility]

## 2023-08-13 ENCOUNTER — Inpatient Hospital Stay: Payer: Medicare Other | Attending: Physician Assistant

## 2023-08-13 ENCOUNTER — Inpatient Hospital Stay: Payer: Medicare Other

## 2023-08-13 VITALS — BP 153/75 | HR 65 | Temp 98.6°F | Resp 14 | Wt 125.0 lb

## 2023-08-13 DIAGNOSIS — R21 Rash and other nonspecific skin eruption: Secondary | ICD-10-CM | POA: Insufficient documentation

## 2023-08-13 DIAGNOSIS — C9000 Multiple myeloma not having achieved remission: Secondary | ICD-10-CM | POA: Insufficient documentation

## 2023-08-13 DIAGNOSIS — Z800 Family history of malignant neoplasm of digestive organs: Secondary | ICD-10-CM | POA: Diagnosis not present

## 2023-08-13 DIAGNOSIS — Z87891 Personal history of nicotine dependence: Secondary | ICD-10-CM | POA: Insufficient documentation

## 2023-08-13 DIAGNOSIS — Z79899 Other long term (current) drug therapy: Secondary | ICD-10-CM | POA: Insufficient documentation

## 2023-08-13 DIAGNOSIS — Z5112 Encounter for antineoplastic immunotherapy: Secondary | ICD-10-CM | POA: Insufficient documentation

## 2023-08-13 LAB — CBC WITH DIFFERENTIAL (CANCER CENTER ONLY)
Abs Immature Granulocytes: 0.03 10*3/uL (ref 0.00–0.07)
Basophils Absolute: 0.1 10*3/uL (ref 0.0–0.1)
Basophils Relative: 1 %
Eosinophils Absolute: 0.4 10*3/uL (ref 0.0–0.5)
Eosinophils Relative: 6 %
HCT: 40.3 % (ref 36.0–46.0)
Hemoglobin: 13.3 g/dL (ref 12.0–15.0)
Immature Granulocytes: 0 %
Lymphocytes Relative: 22 %
Lymphs Abs: 1.6 10*3/uL (ref 0.7–4.0)
MCH: 33 pg (ref 26.0–34.0)
MCHC: 33 g/dL (ref 30.0–36.0)
MCV: 100 fL (ref 80.0–100.0)
Monocytes Absolute: 0.7 10*3/uL (ref 0.1–1.0)
Monocytes Relative: 10 %
Neutro Abs: 4.3 10*3/uL (ref 1.7–7.7)
Neutrophils Relative %: 61 %
Platelet Count: 398 10*3/uL (ref 150–400)
RBC: 4.03 MIL/uL (ref 3.87–5.11)
RDW: 12.2 % (ref 11.5–15.5)
WBC Count: 7.1 10*3/uL (ref 4.0–10.5)
nRBC: 0 % (ref 0.0–0.2)

## 2023-08-13 LAB — CMP (CANCER CENTER ONLY)
ALT: 11 U/L (ref 0–44)
AST: 17 U/L (ref 15–41)
Albumin: 3.9 g/dL (ref 3.5–5.0)
Alkaline Phosphatase: 47 U/L (ref 38–126)
Anion gap: 5 (ref 5–15)
BUN: 11 mg/dL (ref 8–23)
CO2: 28 mmol/L (ref 22–32)
Calcium: 9.1 mg/dL (ref 8.9–10.3)
Chloride: 107 mmol/L (ref 98–111)
Creatinine: 0.66 mg/dL (ref 0.44–1.00)
GFR, Estimated: >60 mL/min (ref >60)
Glucose, Bld: 83 mg/dL (ref 70–99)
Potassium: 4.1 mmol/L (ref 3.5–5.1)
Sodium: 140 mmol/L (ref 135–145)
Total Bilirubin: 0.4 mg/dL (ref 0.0–1.2)
Total Protein: 5.9 g/dL — ABNORMAL LOW (ref 6.5–8.1)

## 2023-08-13 MED ORDER — SODIUM CHLORIDE 0.9 % IV SOLN
40.0000 mg | Freq: Once | INTRAVENOUS | Status: AC
  Administered 2023-08-13: 40 mg via INTRAVENOUS
  Filled 2023-08-13: qty 4

## 2023-08-13 MED ORDER — DEXTROSE 5 % IV SOLN
70.0000 mg/m2 | Freq: Once | INTRAVENOUS | Status: AC
  Administered 2023-08-13: 120 mg via INTRAVENOUS
  Filled 2023-08-13: qty 60

## 2023-08-13 MED ORDER — SODIUM CHLORIDE 0.9 % IV SOLN
Freq: Once | INTRAVENOUS | Status: AC
Start: 2023-08-13 — End: 2023-08-13

## 2023-08-13 MED ORDER — SODIUM CHLORIDE 0.9 % IV SOLN: Freq: Once | INTRAVENOUS | Status: AC

## 2023-08-13 NOTE — Patient Instructions (Signed)

## 2023-08-14 ENCOUNTER — Ambulatory Visit: Payer: Medicare Other

## 2023-08-14 ENCOUNTER — Ambulatory Visit: Payer: Medicare Other | Admitting: Hematology and Oncology

## 2023-08-14 ENCOUNTER — Other Ambulatory Visit: Payer: Medicare Other

## 2023-08-14 LAB — KAPPA/LAMBDA LIGHT CHAINS
Kappa free light chain: 6.5 mg/L (ref 3.3–19.4)
Kappa, lambda light chain ratio: 0.87 (ref 0.26–1.65)
Lambda free light chains: 7.5 mg/L (ref 5.7–26.3)

## 2023-08-20 ENCOUNTER — Other Ambulatory Visit: Payer: Medicare Other

## 2023-08-20 ENCOUNTER — Inpatient Hospital Stay: Payer: Medicare Other

## 2023-08-20 ENCOUNTER — Inpatient Hospital Stay: Payer: Medicare Other | Admitting: Hematology and Oncology

## 2023-08-20 ENCOUNTER — Ambulatory Visit: Payer: Medicare Other | Admitting: Hematology and Oncology

## 2023-08-20 ENCOUNTER — Ambulatory Visit: Payer: Medicare Other

## 2023-08-20 ENCOUNTER — Other Ambulatory Visit: Payer: Self-pay | Admitting: *Deleted

## 2023-08-20 VITALS — BP 163/81 | HR 66 | Temp 97.5°F | Resp 13 | Wt 123.6 lb

## 2023-08-20 DIAGNOSIS — Z800 Family history of malignant neoplasm of digestive organs: Secondary | ICD-10-CM | POA: Diagnosis not present

## 2023-08-20 DIAGNOSIS — Z87891 Personal history of nicotine dependence: Secondary | ICD-10-CM | POA: Diagnosis not present

## 2023-08-20 DIAGNOSIS — C9000 Multiple myeloma not having achieved remission: Secondary | ICD-10-CM

## 2023-08-20 DIAGNOSIS — Z5112 Encounter for antineoplastic immunotherapy: Secondary | ICD-10-CM | POA: Diagnosis not present

## 2023-08-20 DIAGNOSIS — R21 Rash and other nonspecific skin eruption: Secondary | ICD-10-CM | POA: Diagnosis not present

## 2023-08-20 DIAGNOSIS — Z79899 Other long term (current) drug therapy: Secondary | ICD-10-CM | POA: Diagnosis not present

## 2023-08-20 LAB — CMP (CANCER CENTER ONLY)
ALT: 22 U/L (ref 0–44)
AST: 25 U/L (ref 15–41)
Albumin: 3.9 g/dL (ref 3.5–5.0)
Alkaline Phosphatase: 55 U/L (ref 38–126)
Anion gap: 5 (ref 5–15)
BUN: 12 mg/dL (ref 8–23)
CO2: 28 mmol/L (ref 22–32)
Calcium: 9.3 mg/dL (ref 8.9–10.3)
Chloride: 106 mmol/L (ref 98–111)
Creatinine: 0.66 mg/dL (ref 0.44–1.00)
GFR, Estimated: >60 mL/min (ref >60)
Glucose, Bld: 81 mg/dL (ref 70–99)
Potassium: 4.2 mmol/L (ref 3.5–5.1)
Sodium: 139 mmol/L (ref 135–145)
Total Bilirubin: 0.5 mg/dL (ref 0.0–1.2)
Total Protein: 5.9 g/dL — ABNORMAL LOW (ref 6.5–8.1)

## 2023-08-20 LAB — CBC WITH DIFFERENTIAL (CANCER CENTER ONLY)
Abs Immature Granulocytes: 0.1 10*3/uL — ABNORMAL HIGH (ref 0.00–0.07)
Basophils Absolute: 0.1 10*3/uL (ref 0.0–0.1)
Basophils Relative: 1 %
Eosinophils Absolute: 0.8 10*3/uL — ABNORMAL HIGH (ref 0.0–0.5)
Eosinophils Relative: 8 %
HCT: 40.6 % (ref 36.0–46.0)
Hemoglobin: 13.6 g/dL (ref 12.0–15.0)
Immature Granulocytes: 1 %
Lymphocytes Relative: 19 %
Lymphs Abs: 1.9 10*3/uL (ref 0.7–4.0)
MCH: 32.2 pg (ref 26.0–34.0)
MCHC: 33.5 g/dL (ref 30.0–36.0)
MCV: 96 fL (ref 80.0–100.0)
Monocytes Absolute: 1.2 10*3/uL — ABNORMAL HIGH (ref 0.1–1.0)
Monocytes Relative: 12 %
Neutro Abs: 6.2 10*3/uL (ref 1.7–7.7)
Neutrophils Relative %: 59 %
Platelet Count: 233 10*3/uL (ref 150–400)
RBC: 4.23 MIL/uL (ref 3.87–5.11)
RDW: 12.2 % (ref 11.5–15.5)
WBC Count: 10.3 10*3/uL (ref 4.0–10.5)
nRBC: 0 % (ref 0.0–0.2)

## 2023-08-20 NOTE — Progress Notes (Signed)
 [facility] [phone]   [phone]  PROGRESS NOTE  Patient Care Team: Kip Righter, MD as PCP - General (Family Medicine)  CHIEF COMPLAINTS/PURPOSE OF CONSULTATION:  IgG Lambda Multiple Myeloma  ONCOLOGIC HISTORY: 07/27/2018-09/2018: Received weekly velcade  plus dexamethasone . Therapy changed due to poor response.  09/2018: Started Revlimid  25 mg for 21 days on, 7 days off of a 28 day cycle with dexamethasone . Therapy was held after 2 cycles because of severe dermatological toxicity 12/2018: Pomalyst  1 mg daily for 21 days.  She completed 4 cycles of therapy and therapy was held due to occurrence of dermatological toxicity.  07/2019: Started Ninlaro  4 mg weekly with dexamethasone  20 mg weekly for 3 weeks on and 1 week off. Daratumumab  was addd in April 2021. Ninlaro  was discontinued in November 2021. 05/18/2020: Started monthly daratumumab  and transitioned to q 3 month schedule in April 2022.  11/06/2022: Darzalex  discontinued due to progression of disease. M protein 0.9 with worsening lytic lesions in the skeleton.  11/20/2022: Cycle 1 Day 1 of Kyprolis /Dex therapy.  12/19/2022: Cycle 2 Day 1 of Kyprolis /Dex therapy 01/15/2023: Cycle 3 Day 1 of Kyprolis /Dex therapy 02/19/2023: Cycle 4 Day 1 of Kyprolis /Dex therapy 03/18/2023: Cycle 5 Day 1 of Kyprolis /Dex therapy 04/25/2023: Cycle 6 Day 1 of Kyprolis /Dex therapy 05/28/2023: Cycle 7 Day 1 of Kyprolis /Dex therapy 06/11/2023: Cycle 8 Day 1 of Kyprolis /Dex therapy 07/08/2023 Cycle 9 Day 1 of Kyprolis /Dex therapy  HISTORY OF PRESENTING ILLNESS:  Nicole Keith 83 y.o. female returns for a follow up for IgG lambda multiple myeloma. She was last seen on 07/23/2023. She presents today for Cycle 9 Day 15 of Kyprolis /Dex.   On exam today, Nicole Keith reports she is not feeling well today.  She reports that she is having itching and splotching red rashes, mostly on her arms and chest but as well as her privates.   She reports that she also has chemo brain with poor memory.  She reports that she has been taking Pepcid  and Pepto-Bismol in order to help with some of her GI symptoms.  She reports that she is having a lot of sensitivity in her joints and just feels generalized unwell.  She reports that she would like a break and would like to come back to restart treatment on 09/11/2023.  She denies fevers, chills, sweats, shortness of breath, chest pain or cough. She has no other complaints. Rest of the 10 point ROS is below.  Previously we had discussion about the risks and benefits of canceling appointments and skipping treatments.  She notes that she prioritizes her quality of life and is aware that the disease may get out of hand/control without following the scheduled regimen.  She notes that her quality of life is what is most important to her at this time.  We will respect her wishes and give her limited treatment spaced out per her requests.  MEDICAL HISTORY:  Past Medical History:  Diagnosis Date   Allergic rhinitis    Arthritis    Colitis, collagenous    Diverticular disease    severe sig tics/fixation 2012   Family hx of colon cancer    Fracture of femoral neck, right (HCC) 01/09/2013   GERD (gastroesophageal reflux disease)    exacerbation of reflux-like symptoms 04/2011   Hiatal hernia    Hyperlipidemia    muliplt myelom 2020   Osteoarthritis of right hip 01/10/2013    SURGICAL HISTORY: Past Surgical History:  Procedure Laterality Date   CHOLECYSTECTOMY  EYE SURGERY  feb 2016   cataract right eye   LEG SURGERY Left    TONSILLECTOMY     TOTAL HIP ARTHROPLASTY Right 01/10/2013   Procedure: TOTAL HIP ARTHROPLASTY;  Surgeon: Fonda SHAUNNA Olmsted, MD;  Location: [facility] OR;  Service: Orthopedics;  Laterality: Right;    SOCIAL HISTORY: Social History   Socioeconomic History   Marital status: Married    Spouse name: Not on file   Number of children: 0   Years of education: Not on file    Highest education level: Some college, no degree  Occupational History   Occupation: retired  Tobacco Use   Smoking status: Former    Current packs/day: 0.00    Types: Cigarettes    Quit date: 01/10/1972    Years since quitting: 51.6   Smokeless tobacco: Never   Tobacco comments:    quit 1973  Vaping Use   Vaping status: Never Used  Substance and Sexual Activity   Alcohol use: No   Drug use: No   Sexual activity: Not on file  Other Topics Concern   Not on file  Social History Narrative   Married, no children. Drinks 2 cups decaf coffee a day. Walks daily. Is active, plays golf, she and her husband go hiking frequently. 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   Social Drivers of Corporate Investment Banker Strain: Not on file  Food Insecurity: No Food Insecurity (01/17/2023)   Hunger Vital Sign    Worried About Running Out of Food in the Last Year: Never true    Ran Out of Food in the Last Year: Never true  Transportation Needs: No Transportation Needs (01/17/2023)   PRAPARE - Administrator, Civil Service (Medical): No    Lack of Transportation (Non-Medical): No  Physical Activity: Not on file  Stress: Not on file  Social Connections: Not on file  Intimate Partner Violence: Not At Risk (01/17/2023)   Humiliation, Afraid, Rape, and Kick questionnaire    Fear of Current or Ex-Partner: No    Emotionally Abused: No    Physically Abused: No    Sexually Abused: No    FAMILY HISTORY: Family History  Problem Relation Age of Onset   Congestive Heart Failure Mother    Arthritis-Osteo Mother    Colon cancer Mother    Heart attack Father    Rheum arthritis Father    Colon cancer Maternal Aunt    Colon cancer Maternal Aunt    Hypothyroidism Sister     ALLERGIES:  is allergic to kyprolis  [carfilzomib ], pomalyst  [pomalidomide ], revlimid  [lenalidomide ], betadine [povidone iodine], flagyl [metronidazole], fosamax [alendronate sodium], bactrim  [sulfamethoxazole-trimethoprim], clindamycin/lincomycin, doxepin hcl, hydrocodone -acetaminophen , hydroxyzine, hydroxyzine hcl, tramadol hcl, valium [diazepam], actonel [risedronate sodium], boniva [ibandronic acid], doxycycline, and penicillins.  MEDICATIONS:  Current Outpatient Medications  Medication Sig Dispense Refill   acetaminophen  (TYLENOL ) 500 MG tablet Take 1,000 mg by mouth See admin instructions. Take 1,000 mg by mouth after breakfast, at 3:30 PM, and at 8:30 PM     acyclovir  (ZOVIRAX ) 400 MG tablet Take 1 tablet (400 mg total) by mouth 2 (two) times daily. (Patient not taking: Reported on 01/16/2023) 60 tablet 3   albuterol  (VENTOLIN  HFA) 108 (90 Base) MCG/ACT inhaler SMARTSIG:1 Puff(s) By Mouth Every 4-6 Hours PRN     famotidine  (PEPCID ) 20 MG tablet Take 1 tablet (20 mg total) by mouth daily. 30 tablet 0   famotidine -calcium carbonate-magnesium hydroxide (PEPCID  COMPLETE) 10-800-165 MG chewable tablet Chew 1 tablet  by mouth daily as needed (for heartburn or indigestion).      guaiFENesin  (MUCINEX ) 600 MG 12 hr tablet Take 1 tablet (600 mg total) by mouth 2 (two) times daily. 40 tablet 0   loperamide  (IMODIUM ) 2 MG capsule Take 1 capsule (2 mg total) by mouth 3 (three) times daily between meals as needed for diarrhea or loose stools. 30 capsule 1   triamcinolone  cream (KENALOG ) 0.1 % Apply 1 Application topically 2 (two) times daily. (Patient taking differently: Apply 1 Application topically 2 (two) times daily as needed (for allergic flares- affected areas).) 453.6 g 2   VASELINE PURE ULTRA WHITE ointment Apply 1 application  topically See admin instructions. Apply to the genitalia in the morning and at bedtime     No current facility-administered medications for this visit.    REVIEW OF SYSTEMS:   Constitutional: ( - ) fevers, ( - )  chills , ( - ) night sweats Eyes: ( - ) blurriness of vision, ( - ) double vision, ( - ) watery eyes Ears, nose, mouth, throat, and face: ( - )  mucositis, ( - ) sore throat Respiratory: ( - ) cough, ( - ) dyspnea, ( - ) wheezes Cardiovascular: ( - ) palpitation, ( - ) chest discomfort, ( - ) lower extremity swelling Gastrointestinal:  ( - ) nausea, ( - ) heartburn, ( - ) change in bowel habits Skin: ( +) abnormal skin rashes Lymphatics: ( - ) new lymphadenopathy, ( - ) easy bruising Neurological: ( - ) numbness, ( - ) tingling, ( - ) new weaknesses Behavioral/Psych: ( - ) mood change, ( - ) new changes  All other systems were reviewed with the patient and are negative.  PHYSICAL EXAMINATION: ECOG PERFORMANCE STATUS: 1 - Symptomatic but completely ambulatory  Vitals:   08/20/23 1312  BP: (!) 163/81  Pulse: 66  Resp: 13  Temp: (!) 97.5 F (36.4 C)  SpO2: 99%    Filed Weights   08/20/23 1312  Weight: 123 lb 9.6 oz (56.1 kg)     GENERAL: well appearing female in NAD  SKIN: skin color, texture, turgor are normal. Diffuse, flat erythematous rash.  EYES: conjunctiva are pink and non-injected, sclera clear LYMPH:  no palpable lymphadenopathy in the cervical or supraclavicular lymph nodes.  LUNGS: clear to auscultation and percussion with normal breathing effort HEART: regular rate & rhythm and no murmurs and no lower extremity edema Musculoskeletal: no cyanosis of digits and no clubbing  PSYCH: alert & oriented x 3, fluent speech NEURO: no focal motor/sensory deficits  LABORATORY DATA:  I have reviewed the data as listed    Latest Ref Rng & Units 08/20/2023   12:26 PM 08/13/2023   12:26 PM 07/23/2023   12:06 PM  CBC  WBC 4.0 - 10.5 K/uL 10.3  7.1  10.6   Hemoglobin 12.0 - 15.0 g/dL 86.3  86.6  87.0   Hematocrit 36.0 - 46.0 % 40.6  40.3  38.5   Platelets 150 - 400 K/uL 233  398  484        Latest Ref Rng & Units 08/20/2023   12:26 PM 08/13/2023   12:26 PM 07/23/2023   12:06 PM  CMP  Glucose 70 - 99 mg/dL 81  83  892   BUN 8 - 23 mg/dL 12  11  14    Creatinine 0.44 - 1.00 mg/dL 9.33  9.33  9.32   Sodium 135 - 145  mmol/L 139  140  139  Potassium 3.5 - 5.1 mmol/L 4.2  4.1  3.7   Chloride 98 - 111 mmol/L 106  107  104   CO2 22 - 32 mmol/L 28  28  29    Calcium 8.9 - 10.3 mg/dL 9.3  9.1  9.1   Total Protein 6.5 - 8.1 g/dL 5.9  5.9  5.9   Total Bilirubin 0.0 - 1.2 mg/dL 0.5  0.4  0.6   Alkaline Phos 38 - 126 U/L 55  47  54   AST 15 - 41 U/L 25  17  21    ALT 0 - 44 U/L 22  11  18      ASSESSMENT & PLAN Nicole Keith is a 83 y.o.female who presents to the clinic for follow up for multiple myeloma.  #IgG Lambda Multiple Myeloma: --Initially presented in April 2016 with M spike of 3.2 g/dL and IgG level of 5314. Underwent bone marrow biopsy from 11/20/2014 show 29% plasma cells. 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  --In December 2019 with rising M spike of 5.9 g/dL and worsening anemia, with Hgb 8.7, she met criteria for transformation of active multiple myeloma.  --From 07/27/2018-09/2018, received weekly velcade  plus dexamethasone . Therapy changed due to poor response.  --In 09/2018, started Revlimid  25 mg for 21 days on, 7 days off of a 28 day cycle with dexamethasone . Therapy was held after 2 cycles because of severe dermatological toxicity --In 12/2018, switched to Pomalyst  1 mg daily for 21 days.  She completed 4 cycles of therapy and therapy was held due to occurrence of dermatological toxicity.  --In 07/2019, switch to Ninlaro  4 mg weekly with dexamethasone  20 mg weekly for 3 weeks on and 1 week off. Daratumumab  was addd in April 2021. Ninlaro  was discontinued in November 2021. --On 05/18/2020, started monthly daratumumab  and transitioned to q 3 month schedule in April 2022.  --On 11/20/2022, switch to Kyprolis /Dex due to rising M-protein and bone survey that showed worsening lytic lesions.  PLAN:   --Labs today show white blood cell count 10.3, hemoglobin 13.6, MCV 96, platelets 233. creatinine and LFTs are normal.  --Most recent myeloma labs from 05/28/2023 showed improvement  of M protein to 0.4 --Due for Cycle 9 Day 15 of Kyprolis /Dex. Proceed without any dose modifications. Discussed that M protein needs to improve, closer to 0.2-0.3 before making any adjustments to the frequency of her treatment. I advised patient that weekly Kyprolis /Dex is standard of care.  --Patient is clear with her wishes that she wants to prioritize her quality of life and would prefer to skip doses when she is feeling unwell and when she feels like she has had too much treatment.  I will respect her wishes but continue to encourage her to receive the full treatment as scheduled. --Underwent metastatic survey on 02/20/2023, appears to have stable lytic lesions from prior. --RTC in 2 weeks for continuation of Kyprolis /Dex with continued weekly treatments.   #Diffuse erythematous rash-stable --Managed with kenalog  cream. -- nystatin  powder for groin area.  --Discussed referral to dermatology, patient deferred at this time.   #Supportive Care -- port not required but can be placed if requested.  -- zofran  8mg  q8H PRN and compazine  10mg  PO q6H for nausea -- acyclovir  400mg  PO BID for VCZ prophylaxis -- no pain medication required at this time.  -- discussed role for zometa  therapy in the setting her lytic lesions. Explained zometa  is given q 12 weeks and requires a dental clearance.  After discussing this with the  patient further today she knows she would like to hold on this for the time being as she is afraid of the side effects.  Strongly emphasized that she should reconsider as this will be helpful with her lytic bone lesions.  No orders of the defined types were placed in this encounter.  All questions were answered. The patient knows to call the clinic with any problems, questions or concerns.  I have spent a total of 30 minutes minutes of face-to-face and non-face-to-face time, preparing to see the patient,  performing a medically appropriate examination, counseling and educating the  patient, ordering medications/tests/procedures,documenting clinical information in the electronic health record, and care coordination.   Norleen IVAR Kidney, MD Department of Hematology/Oncology [facility] Phone: [phone] Pager: [phone] Email: [email or web address]

## 2023-08-21 LAB — MULTIPLE MYELOMA PANEL, SERUM
Albumin SerPl Elph-Mcnc: 3.6 g/dL (ref 2.9–4.4)
Albumin/Glob SerPl: 1.9 — ABNORMAL HIGH (ref 0.7–1.7)
Alpha 1: 0.2 g/dL (ref 0.0–0.4)
Alpha2 Glob SerPl Elph-Mcnc: 0.5 g/dL (ref 0.4–1.0)
B-Globulin SerPl Elph-Mcnc: 0.8 g/dL (ref 0.7–1.3)
Gamma Glob SerPl Elph-Mcnc: 0.4 g/dL (ref 0.4–1.8)
Globulin, Total: 2 g/dL — ABNORMAL LOW (ref 2.2–3.9)
IgA: 12 mg/dL — ABNORMAL LOW (ref 64–422)
IgG (Immunoglobin G), Serum: 451 mg/dL — ABNORMAL LOW (ref 586–1602)
IgM (Immunoglobulin M), Srm: 11 mg/dL — ABNORMAL LOW (ref 26–217)
M Protein SerPl Elph-Mcnc: 0.3 g/dL — ABNORMAL HIGH
Total Protein ELP: 5.6 g/dL — ABNORMAL LOW (ref 6.0–8.5)

## 2023-08-28 ENCOUNTER — Other Ambulatory Visit: Payer: Medicare Other

## 2023-08-28 ENCOUNTER — Ambulatory Visit: Payer: Medicare Other

## 2023-09-01 ENCOUNTER — Encounter: Payer: Self-pay | Admitting: Hematology and Oncology

## 2023-09-02 ENCOUNTER — Ambulatory Visit: Payer: Medicare Other | Admitting: Hematology and Oncology

## 2023-09-02 ENCOUNTER — Ambulatory Visit: Payer: Medicare Other

## 2023-09-02 ENCOUNTER — Other Ambulatory Visit: Payer: Medicare Other

## 2023-09-04 ENCOUNTER — Other Ambulatory Visit: Payer: Medicare Other

## 2023-09-04 ENCOUNTER — Ambulatory Visit: Payer: Medicare Other | Admitting: Physician Assistant

## 2023-09-04 ENCOUNTER — Ambulatory Visit: Payer: Medicare Other

## 2023-09-08 ENCOUNTER — Other Ambulatory Visit: Payer: Self-pay

## 2023-09-08 DIAGNOSIS — Z85828 Personal history of other malignant neoplasm of skin: Secondary | ICD-10-CM | POA: Diagnosis not present

## 2023-09-08 DIAGNOSIS — L300 Nummular dermatitis: Secondary | ICD-10-CM | POA: Diagnosis not present

## 2023-09-10 ENCOUNTER — Other Ambulatory Visit: Payer: Medicare Other

## 2023-09-10 ENCOUNTER — Ambulatory Visit: Payer: Medicare Other

## 2023-09-11 ENCOUNTER — Inpatient Hospital Stay: Payer: Medicare Other

## 2023-09-11 ENCOUNTER — Inpatient Hospital Stay (HOSPITAL_BASED_OUTPATIENT_CLINIC_OR_DEPARTMENT_OTHER): Payer: Medicare Other | Admitting: Hematology and Oncology

## 2023-09-11 VITALS — BP 119/66 | HR 68 | Temp 98.2°F | Resp 17 | Wt 126.1 lb

## 2023-09-11 DIAGNOSIS — Z5112 Encounter for antineoplastic immunotherapy: Secondary | ICD-10-CM | POA: Diagnosis not present

## 2023-09-11 DIAGNOSIS — Z79899 Other long term (current) drug therapy: Secondary | ICD-10-CM | POA: Diagnosis not present

## 2023-09-11 DIAGNOSIS — R21 Rash and other nonspecific skin eruption: Secondary | ICD-10-CM | POA: Diagnosis not present

## 2023-09-11 DIAGNOSIS — C9000 Multiple myeloma not having achieved remission: Secondary | ICD-10-CM | POA: Diagnosis not present

## 2023-09-11 DIAGNOSIS — Z800 Family history of malignant neoplasm of digestive organs: Secondary | ICD-10-CM | POA: Diagnosis not present

## 2023-09-11 DIAGNOSIS — Z87891 Personal history of nicotine dependence: Secondary | ICD-10-CM | POA: Diagnosis not present

## 2023-09-11 LAB — CMP (CANCER CENTER ONLY)
ALT: 14 U/L (ref 0–44)
AST: 20 U/L (ref 15–41)
Albumin: 4.2 g/dL (ref 3.5–5.0)
Alkaline Phosphatase: 52 U/L (ref 38–126)
Anion gap: 7 (ref 5–15)
BUN: 14 mg/dL (ref 8–23)
CO2: 30 mmol/L (ref 22–32)
Calcium: 9.4 mg/dL (ref 8.9–10.3)
Chloride: 104 mmol/L (ref 98–111)
Creatinine: 0.67 mg/dL (ref 0.44–1.00)
GFR, Estimated: >60 mL/min (ref >60)
Glucose, Bld: 82 mg/dL (ref 70–99)
Potassium: 4.2 mmol/L (ref 3.5–5.1)
Sodium: 141 mmol/L (ref 135–145)
Total Bilirubin: 0.5 mg/dL (ref 0.0–1.2)
Total Protein: 6.3 g/dL — ABNORMAL LOW (ref 6.5–8.1)

## 2023-09-11 LAB — CBC WITH DIFFERENTIAL (CANCER CENTER ONLY)
Abs Immature Granulocytes: 0.03 10*3/uL (ref 0.00–0.07)
Basophils Absolute: 0.1 10*3/uL (ref 0.0–0.1)
Basophils Relative: 1 %
Eosinophils Absolute: 0.2 10*3/uL (ref 0.0–0.5)
Eosinophils Relative: 2 %
HCT: 42.4 % (ref 36.0–46.0)
Hemoglobin: 13.8 g/dL (ref 12.0–15.0)
Immature Granulocytes: 0 %
Lymphocytes Relative: 20 %
Lymphs Abs: 1.8 10*3/uL (ref 0.7–4.0)
MCH: 31.9 pg (ref 26.0–34.0)
MCHC: 32.5 g/dL (ref 30.0–36.0)
MCV: 98.1 fL (ref 80.0–100.0)
Monocytes Absolute: 0.9 10*3/uL (ref 0.1–1.0)
Monocytes Relative: 10 %
Neutro Abs: 6.1 10*3/uL (ref 1.7–7.7)
Neutrophils Relative %: 67 %
Platelet Count: 345 10*3/uL (ref 150–400)
RBC: 4.32 MIL/uL (ref 3.87–5.11)
RDW: 12 % (ref 11.5–15.5)
WBC Count: 9.1 10*3/uL (ref 4.0–10.5)
nRBC: 0 % (ref 0.0–0.2)

## 2023-09-11 MED ORDER — DEXTROSE 5 % IV SOLN
70.0000 mg/m2 | Freq: Once | INTRAVENOUS | Status: AC
  Administered 2023-09-11: 120 mg via INTRAVENOUS
  Filled 2023-09-11: qty 60

## 2023-09-11 MED ORDER — SODIUM CHLORIDE 0.9 % IV SOLN
40.0000 mg | Freq: Once | INTRAVENOUS | Status: AC
  Administered 2023-09-11: 40 mg via INTRAVENOUS
  Filled 2023-09-11: qty 4

## 2023-09-11 MED ORDER — SODIUM CHLORIDE 0.9 % IV SOLN: Freq: Once | INTRAVENOUS | Status: AC

## 2023-09-11 NOTE — Patient Instructions (Signed)
 [facility] - A DEPT OF [facility]  Discharge Instructions: Thank you for choosing [facility] to provide your oncology and hematology care.   If you have a lab appointment with the Cancer Center, please go directly to the Cancer Center and check in at the registration area.   Wear comfortable clothing and clothing appropriate for easy access to any Portacath or PICC line.   We strive to give you quality time with your provider. You may need to reschedule your appointment if you arrive late (15 or more minutes).  Arriving late affects you and other patients whose appointments are after yours.  Also, if you miss three or more appointments without notifying the office, you may be dismissed from the clinic at the provider's discretion.      For prescription refill requests, have your pharmacy contact our office and allow 72 hours for refills to be completed.    Today you received the following chemotherapy and/or immunotherapy agents: Kyprolis.       To help prevent nausea and vomiting after your treatment, we encourage you to take your nausea medication as directed.  BELOW ARE SYMPTOMS THAT SHOULD BE REPORTED IMMEDIATELY: *FEVER GREATER THAN 100.4 F (38 C) OR HIGHER *CHILLS OR SWEATING *NAUSEA AND VOMITING THAT IS NOT CONTROLLED WITH YOUR NAUSEA MEDICATION *UNUSUAL SHORTNESS OF BREATH *UNUSUAL BRUISING OR BLEEDING *URINARY PROBLEMS (pain or burning when urinating, or frequent urination) *BOWEL PROBLEMS (unusual diarrhea, constipation, pain near the anus) TENDERNESS IN MOUTH AND THROAT WITH OR WITHOUT PRESENCE OF ULCERS (sore throat, sores in mouth, or a toothache) UNUSUAL RASH, SWELLING OR PAIN  UNUSUAL VAGINAL DISCHARGE OR ITCHING   Items with * indicate a potential emergency and should be followed up as soon as possible or go to the Emergency Department if any problems should occur.  Please show the CHEMOTHERAPY ALERT CARD or IMMUNOTHERAPY  ALERT CARD at check-in to the Emergency Department and triage nurse.  Should you have questions after your visit or need to cancel or reschedule your appointment, please contact [facility] - A DEPT OF [facility]  Dept: [phone]  and follow the prompts.  Office hours are 8:00 a.m. to 4:30 p.m. Monday - Friday. Please note that voicemails left after 4:00 p.m. may not be returned until the following business day.  We are closed weekends and major holidays. You have access to a nurse at all times for urgent questions. Please call the main number to the clinic Dept: [phone] and follow the prompts.   For any non-urgent questions, you may also contact your provider using MyChart. We now offer e-Visits for anyone 45 and older to request care online for non-urgent symptoms. For details visit [email or web address].   Also download the MyChart app! Go to the app store, search "MyChart", open the app, select [place], and log in with your MyChart username and password.

## 2023-09-11 NOTE — Progress Notes (Unsigned)
[facility] [phone]   [phone]  PROGRESS NOTE  Patient Care Team: Farris Has, MD as PCP - General (Family Medicine)  CHIEF COMPLAINTS/PURPOSE OF CONSULTATION:  IgG Lambda Multiple Myeloma  ONCOLOGIC HISTORY: 07/27/2018-09/2018: Received weekly velcade plus dexamethasone. Therapy changed due to poor response.  09/2018: Started Revlimid 25 mg for 21 days on, 7 days off of a 28 day cycle with dexamethasone. Therapy was held after 2 cycles because of severe dermatological toxicity 12/2018: Pomalyst 1 mg daily for 21 days.  She completed 4 cycles of therapy and therapy was held due to occurrence of dermatological toxicity.  07/2019: Started Ninlaro 4 mg weekly with dexamethasone 20 mg weekly for 3 weeks on and 1 week off. Daratumumab was addd in April 2021. Clearnce Sorrel was discontinued in November 2021. 05/18/2020: Started monthly daratumumab and transitioned to q 3 month schedule in April 2022.  11/06/2022: Darzalex discontinued due to progression of disease. M protein 0.9 with worsening lytic lesions in the skeleton.  11/20/2022: Cycle 1 Day 1 of Kyprolis/Dex therapy.  12/19/2022: Cycle 2 Day 1 of Kyprolis/Dex therapy 01/15/2023: Cycle 3 Day 1 of Kyprolis/Dex therapy 02/19/2023: Cycle 4 Day 1 of Kyprolis/Dex therapy 03/18/2023: Cycle 5 Day 1 of Kyprolis/Dex therapy 04/25/2023: Cycle 6 Day 1 of Kyprolis/Dex therapy 05/28/2023: Cycle 7 Day 1 of Kyprolis/Dex therapy 06/11/2023: Cycle 8 Day 1 of Kyprolis/Dex therapy 07/08/2023 Cycle 9 Day 1 of Kyprolis/Dex therapy  HISTORY OF PRESENTING ILLNESS:  Nicole Keith 83 y.o. female returns for a follow up for IgG lambda multiple myeloma. She was last seen on 07/23/2023. She presents today for Cycle 10 of Kyprolis/Dex.   On exam today, Nicole Keith reports ***  She denies fevers, chills, sweats, shortness of breath, chest pain or cough. She has no other complaints. Rest of the 10 point ROS is below.  Previously we  had discussion about the risks and benefits of canceling appointments and skipping treatments.  She notes that she prioritizes her quality of life and is aware that the disease may get out of hand/control without following the scheduled regimen.  She notes that her quality of life is what is most important to her at this time.  We will respect her wishes and give her limited treatment spaced out per her requests.  MEDICAL HISTORY:  Past Medical History:  Diagnosis Date   Allergic rhinitis    Arthritis    Colitis, collagenous    Diverticular disease    severe sig tics/fixation 2012   Family hx of colon cancer    Fracture of femoral neck, right (HCC) 01/09/2013   GERD (gastroesophageal reflux disease)    exacerbation of reflux-like symptoms 04/2011   Hiatal hernia    Hyperlipidemia    muliplt myelom 2020   Osteoarthritis of right hip 01/10/2013    SURGICAL HISTORY: Past Surgical History:  Procedure Laterality Date   CHOLECYSTECTOMY     EYE SURGERY  feb 2016   cataract right eye   LEG SURGERY Left    TONSILLECTOMY     TOTAL HIP ARTHROPLASTY Right 01/10/2013   Procedure: TOTAL HIP ARTHROPLASTY;  Surgeon: Eulas Post, MD;  Location: [facility] OR;  Service: Orthopedics;  Laterality: Right;    SOCIAL HISTORY: Social History   Socioeconomic History   Marital status: Married    Spouse name: Not on file   Number of children: 0   Years of education: Not on file   Highest education level: Some college, no degree  Occupational History  Occupation: retired  Tobacco Use   Smoking status: Former    Current packs/day: 0.00    Types: Cigarettes    Quit date: 01/10/1972    Years since quitting: 51.7   Smokeless tobacco: Never   Tobacco comments:    quit 1973  Vaping Use   Vaping status: Never Used  Substance and Sexual Activity   Alcohol use: No   Drug use: No   Sexual activity: Not on file  Other Topics Concern   Not on file  Social History Narrative   Married, no children. Drinks  2 cups decaf coffee a day. Walks daily. Is active, plays golf, she and her husband go hiking frequently. 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   Social Drivers of Corporate investment banker Strain: Not on file  Food Insecurity: No Food Insecurity (01/17/2023)   Hunger Vital Sign    Worried About Running Out of Food in the Last Year: Never true    Ran Out of Food in the Last Year: Never true  Transportation Needs: No Transportation Needs (01/17/2023)   PRAPARE - Administrator, Civil Service (Medical): No    Lack of Transportation (Non-Medical): No  Physical Activity: Not on file  Stress: Not on file  Social Connections: Not on file  Intimate Partner Violence: Not At Risk (01/17/2023)   Humiliation, Afraid, Rape, and Kick questionnaire    Fear of Current or Ex-Partner: No    Emotionally Abused: No    Physically Abused: No    Sexually Abused: No    FAMILY HISTORY: Family History  Problem Relation Age of Onset   Congestive Heart Failure Mother    Arthritis-Osteo Mother    Colon cancer Mother    Heart attack Father    Rheum arthritis Father    Colon cancer Maternal Aunt    Colon cancer Maternal Aunt    Hypothyroidism Sister     ALLERGIES:  is allergic to kyprolis [carfilzomib], pomalyst [pomalidomide], revlimid [lenalidomide], betadine [povidone iodine], flagyl [metronidazole], fosamax [alendronate sodium], bactrim [sulfamethoxazole-trimethoprim], clindamycin/lincomycin, doxepin hcl, hydrocodone-acetaminophen, hydroxyzine, hydroxyzine hcl, tramadol hcl, valium [diazepam], actonel [risedronate sodium], boniva [ibandronic acid], doxycycline, and penicillins.  MEDICATIONS:  Current Outpatient Medications  Medication Sig Dispense Refill   acetaminophen (TYLENOL) 500 MG tablet Take 1,000 mg by mouth See admin instructions. Take 1,000 mg by mouth after breakfast, at 3:30 PM, and at 8:30 PM     acyclovir (ZOVIRAX) 400 MG tablet Take 1 tablet (400 mg total) by  mouth 2 (two) times daily. (Patient not taking: Reported on 01/16/2023) 60 tablet 3   albuterol (VENTOLIN HFA) 108 (90 Base) MCG/ACT inhaler SMARTSIG:1 Puff(s) By Mouth Every 4-6 Hours PRN     famotidine (PEPCID) 20 MG tablet Take 1 tablet (20 mg total) by mouth daily. 30 tablet 0   famotidine-calcium carbonate-magnesium hydroxide (PEPCID COMPLETE) 10-800-165 MG chewable tablet Chew 1 tablet by mouth daily as needed (for heartburn or indigestion).      guaiFENesin (MUCINEX) 600 MG 12 hr tablet Take 1 tablet (600 mg total) by mouth 2 (two) times daily. 40 tablet 0   loperamide (IMODIUM) 2 MG capsule Take 1 capsule (2 mg total) by mouth 3 (three) times daily between meals as needed for diarrhea or loose stools. 30 capsule 1   triamcinolone cream (KENALOG) 0.1 % Apply 1 Application topically 2 (two) times daily. (Patient taking differently: Apply 1 Application topically 2 (two) times daily as needed (for allergic flares- affected areas).) 453.6 g  2   VASELINE PURE ULTRA WHITE ointment Apply 1 application  topically See admin instructions. Apply to the genitalia in the morning and at bedtime     No current facility-administered medications for this visit.    REVIEW OF SYSTEMS:   Constitutional: ( - ) fevers, ( - )  chills , ( - ) night sweats Eyes: ( - ) blurriness of vision, ( - ) double vision, ( - ) watery eyes Ears, nose, mouth, throat, and face: ( - ) mucositis, ( - ) sore throat Respiratory: ( - ) cough, ( - ) dyspnea, ( - ) wheezes Cardiovascular: ( - ) palpitation, ( - ) chest discomfort, ( - ) lower extremity swelling Gastrointestinal:  ( - ) nausea, ( - ) heartburn, ( - ) change in bowel habits Skin: ( +) abnormal skin rashes Lymphatics: ( - ) new lymphadenopathy, ( - ) easy bruising Neurological: ( - ) numbness, ( - ) tingling, ( - ) new weaknesses Behavioral/Psych: ( - ) mood change, ( - ) new changes  All other systems were reviewed with the patient and are negative.  PHYSICAL  EXAMINATION: ECOG PERFORMANCE STATUS: 1 - Symptomatic but completely ambulatory  There were no vitals filed for this visit.   There were no vitals filed for this visit.    GENERAL: well appearing female in NAD  SKIN: skin color, texture, turgor are normal. Diffuse, flat erythematous rash.  EYES: conjunctiva are pink and non-injected, sclera clear LYMPH:  no palpable lymphadenopathy in the cervical or supraclavicular lymph nodes.  LUNGS: clear to auscultation and percussion with normal breathing effort HEART: regular rate & rhythm and no murmurs and no lower extremity edema Musculoskeletal: no cyanosis of digits and no clubbing  PSYCH: alert & oriented x 3, fluent speech NEURO: no focal motor/sensory deficits  LABORATORY DATA:  I have reviewed the data as listed    Latest Ref Rng & Units 08/20/2023   12:26 PM 08/13/2023   12:26 PM 07/23/2023   12:06 PM  CBC  WBC 4.0 - 10.5 K/uL 10.3  7.1  10.6   Hemoglobin 12.0 - 15.0 g/dL 16.1  09.6  04.5   Hematocrit 36.0 - 46.0 % 40.6  40.3  38.5   Platelets 150 - 400 K/uL 233  398  484        Latest Ref Rng & Units 08/20/2023   12:26 PM 08/13/2023   12:26 PM 07/23/2023   12:06 PM  CMP  Glucose 70 - 99 mg/dL 81  83  409   BUN 8 - 23 mg/dL 12  11  14    Creatinine 0.44 - 1.00 mg/dL 8.11  9.14  7.82   Sodium 135 - 145 mmol/L 139  140  139   Potassium 3.5 - 5.1 mmol/L 4.2  4.1  3.7   Chloride 98 - 111 mmol/L 106  107  104   CO2 22 - 32 mmol/L 28  28  29    Calcium 8.9 - 10.3 mg/dL 9.3  9.1  9.1   Total Protein 6.5 - 8.1 g/dL 5.9  5.9  5.9   Total Bilirubin 0.0 - 1.2 mg/dL 0.5  0.4  0.6   Alkaline Phos 38 - 126 U/L 55  47  54   AST 15 - 41 U/L 25  17  21    ALT 0 - 44 U/L 22  11  18      ASSESSMENT & PLAN Nicole Keith is a 83 y.o.female who presents to  the clinic for follow up for multiple myeloma.  #IgG Lambda Multiple Myeloma: --Initially presented in April 2016 with M spike of 3.2 g/dL and IgG level of 1610. Underwent bone marrow  biopsy from 11/20/2014 show 29% plasma cells. 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  --In December 2019 with rising M spike of 5.9 g/dL and worsening anemia, with Hgb 8.7, she met criteria for transformation of active multiple myeloma.  --From 07/27/2018-09/2018, received weekly velcade plus dexamethasone. Therapy changed due to poor response.  --In 09/2018, started Revlimid 25 mg for 21 days on, 7 days off of a 28 day cycle with dexamethasone. Therapy was held after 2 cycles because of severe dermatological toxicity --In 12/2018, switched to Pomalyst 1 mg daily for 21 days.  She completed 4 cycles of therapy and therapy was held due to occurrence of dermatological toxicity.  --In 07/2019, switch to Ninlaro 4 mg weekly with dexamethasone 20 mg weekly for 3 weeks on and 1 week off. Daratumumab was addd in April 2021. Clearnce Sorrel was discontinued in November 2021. --On 05/18/2020, started monthly daratumumab and transitioned to q 3 month schedule in April 2022.  --On 11/20/2022, switch to Kyprolis/Dex due to rising M-protein and bone survey that showed worsening lytic lesions.  PLAN:   --Labs today show white blood cell count ***. creatinine and LFTs are normal.  --Most recent myeloma labs from 05/28/2023 showed improvement of M protein to 0.4 --Due for Cycle 9 Day 15 of Kyprolis/Dex. Proceed without any dose modifications. Discussed that M protein needs to improve, closer to 0.2-0.3 before making any adjustments to the frequency of her treatment. I advised patient that weekly Kyprolis/Dex is standard of care.  --Patient is clear with her wishes that she wants to prioritize her quality of life and would prefer to skip doses when she is feeling unwell and when she feels like she has had too much treatment.  I will respect her wishes but continue to encourage her to receive the full treatment as scheduled. --Underwent metastatic survey on 02/20/2023, appears to have stable lytic lesions from  prior. --RTC in 2 weeks for continuation of Kyprolis/Dex with continued weekly treatments.   #Diffuse erythematous rash-stable --Managed with kenalog cream. -- nystatin powder for groin area.  --Discussed referral to dermatology, patient deferred at this time.   #Supportive Care -- port not required but can be placed if requested.  -- zofran 8mg  q8H PRN and compazine 10mg  PO q6H for nausea -- acyclovir 400mg  PO BID for VCZ prophylaxis -- no pain medication required at this time.  -- discussed role for zometa therapy in the setting her lytic lesions. Explained zometa is given q 12 weeks and requires a dental clearance.  After discussing this with the patient further today she knows she would like to hold on this for the time being as she is afraid of the side effects.  Strongly emphasized that she should reconsider as this will be helpful with her lytic bone lesions.  No orders of the defined types were placed in this encounter.  All questions were answered. The patient knows to call the clinic with any problems, questions or concerns.  I have spent a total of 30 minutes minutes of face-to-face and non-face-to-face time, preparing to see the patient,  performing a medically appropriate examination, counseling and educating the patient, ordering medications/tests/procedures,documenting clinical information in the electronic health record, and care coordination.   Ulysees Barns, MD Department of Hematology/Oncology [facility]  Phone: [phone] Pager: [phone] Email: [email or web address]

## 2023-09-15 ENCOUNTER — Other Ambulatory Visit: Payer: Self-pay

## 2023-09-17 ENCOUNTER — Ambulatory Visit: Payer: Medicare Other | Admitting: Hematology and Oncology

## 2023-09-17 ENCOUNTER — Other Ambulatory Visit: Payer: Medicare Other

## 2023-09-17 ENCOUNTER — Encounter: Payer: Self-pay | Admitting: Hematology and Oncology

## 2023-09-17 ENCOUNTER — Ambulatory Visit: Payer: Medicare Other

## 2023-09-18 ENCOUNTER — Other Ambulatory Visit: Payer: Medicare Other

## 2023-09-18 ENCOUNTER — Other Ambulatory Visit: Payer: Self-pay

## 2023-09-18 ENCOUNTER — Ambulatory Visit: Payer: Medicare Other

## 2023-09-18 ENCOUNTER — Ambulatory Visit: Payer: Medicare Other | Admitting: Physician Assistant

## 2023-09-20 ENCOUNTER — Other Ambulatory Visit: Payer: Self-pay

## 2023-09-24 ENCOUNTER — Other Ambulatory Visit: Payer: Self-pay

## 2023-09-25 ENCOUNTER — Inpatient Hospital Stay: Payer: Medicare Other

## 2023-09-25 ENCOUNTER — Other Ambulatory Visit: Payer: Self-pay

## 2023-09-25 ENCOUNTER — Other Ambulatory Visit: Payer: Medicare Other

## 2023-09-25 ENCOUNTER — Inpatient Hospital Stay (HOSPITAL_BASED_OUTPATIENT_CLINIC_OR_DEPARTMENT_OTHER): Payer: Medicare Other | Admitting: Physician Assistant

## 2023-09-25 ENCOUNTER — Ambulatory Visit: Payer: Medicare Other

## 2023-09-25 ENCOUNTER — Inpatient Hospital Stay: Payer: Medicare Other | Attending: Physician Assistant

## 2023-09-25 ENCOUNTER — Ambulatory Visit: Payer: Medicare Other | Admitting: Hematology and Oncology

## 2023-09-25 VITALS — BP 154/78 | HR 68 | Resp 16

## 2023-09-25 VITALS — BP 136/74 | HR 64 | Temp 97.5°F | Resp 13 | Wt 126.2 lb

## 2023-09-25 DIAGNOSIS — Z5112 Encounter for antineoplastic immunotherapy: Secondary | ICD-10-CM | POA: Insufficient documentation

## 2023-09-25 DIAGNOSIS — C9000 Multiple myeloma not having achieved remission: Secondary | ICD-10-CM | POA: Diagnosis not present

## 2023-09-25 DIAGNOSIS — Z79899 Other long term (current) drug therapy: Secondary | ICD-10-CM | POA: Diagnosis not present

## 2023-09-25 LAB — CMP (CANCER CENTER ONLY)
ALT: 18 U/L (ref 0–44)
AST: 19 U/L (ref 15–41)
Albumin: 4 g/dL (ref 3.5–5.0)
Alkaline Phosphatase: 53 U/L (ref 38–126)
Anion gap: 5 (ref 5–15)
BUN: 13 mg/dL (ref 8–23)
CO2: 29 mmol/L (ref 22–32)
Calcium: 9.5 mg/dL (ref 8.9–10.3)
Chloride: 104 mmol/L (ref 98–111)
Creatinine: 0.64 mg/dL (ref 0.44–1.00)
GFR, Estimated: >60 mL/min (ref >60)
Glucose, Bld: 85 mg/dL (ref 70–99)
Potassium: 4.3 mmol/L (ref 3.5–5.1)
Sodium: 138 mmol/L (ref 135–145)
Total Bilirubin: 0.4 mg/dL (ref 0.0–1.2)
Total Protein: 6 g/dL — ABNORMAL LOW (ref 6.5–8.1)

## 2023-09-25 LAB — CBC WITH DIFFERENTIAL (CANCER CENTER ONLY)
Abs Immature Granulocytes: 0.03 10*3/uL (ref 0.00–0.07)
Basophils Absolute: 0.1 10*3/uL (ref 0.0–0.1)
Basophils Relative: 1 %
Eosinophils Absolute: 0.3 10*3/uL (ref 0.0–0.5)
Eosinophils Relative: 3 %
HCT: 40.5 % (ref 36.0–46.0)
Hemoglobin: 13.5 g/dL (ref 12.0–15.0)
Immature Granulocytes: 0 %
Lymphocytes Relative: 20 %
Lymphs Abs: 2.2 10*3/uL (ref 0.7–4.0)
MCH: 31.9 pg (ref 26.0–34.0)
MCHC: 33.3 g/dL (ref 30.0–36.0)
MCV: 95.7 fL (ref 80.0–100.0)
Monocytes Absolute: 0.9 10*3/uL (ref 0.1–1.0)
Monocytes Relative: 8 %
Neutro Abs: 7.8 10*3/uL — ABNORMAL HIGH (ref 1.7–7.7)
Neutrophils Relative %: 68 %
Platelet Count: 537 10*3/uL — ABNORMAL HIGH (ref 150–400)
RBC: 4.23 MIL/uL (ref 3.87–5.11)
RDW: 12.4 % (ref 11.5–15.5)
WBC Count: 11.3 10*3/uL — ABNORMAL HIGH (ref 4.0–10.5)
nRBC: 0 % (ref 0.0–0.2)

## 2023-09-25 MED ORDER — SODIUM CHLORIDE 0.9 % IV SOLN: Freq: Once | INTRAVENOUS | Status: AC

## 2023-09-25 MED ORDER — SODIUM CHLORIDE 0.9 % IV SOLN
Freq: Once | INTRAVENOUS | Status: AC
Start: 2023-09-25 — End: 2023-09-25

## 2023-09-25 MED ORDER — CARFILZOMIB CHEMO INJECTION 60 MG
70.0000 mg/m2 | Freq: Once | INTRAVENOUS | Status: AC
  Administered 2023-09-25: 120 mg via INTRAVENOUS
  Filled 2023-09-25: qty 60

## 2023-09-25 MED ORDER — SODIUM CHLORIDE 0.9 % IV SOLN
40.0000 mg | Freq: Once | INTRAVENOUS | Status: AC
  Administered 2023-09-25: 40 mg via INTRAVENOUS
  Filled 2023-09-25: qty 4

## 2023-09-25 NOTE — Progress Notes (Signed)
[facility] [phone]   [phone]  PROGRESS NOTE  Patient Care Team: Farris Has, MD as PCP - General (Family Medicine)  CHIEF COMPLAINTS/PURPOSE OF CONSULTATION:  IgG Lambda Multiple Myeloma  ONCOLOGIC HISTORY: 07/27/2018-09/2018: Received weekly velcade plus dexamethasone. Therapy changed due to poor response.  09/2018: Started Revlimid 25 mg for 21 days on, 7 days off of a 28 day cycle with dexamethasone. Therapy was held after 2 cycles because of severe dermatological toxicity 12/2018: Pomalyst 1 mg daily for 21 days.  She completed 4 cycles of therapy and therapy was held due to occurrence of dermatological toxicity.  07/2019: Started Ninlaro 4 mg weekly with dexamethasone 20 mg weekly for 3 weeks on and 1 week off. Daratumumab was addd in April 2021. Clearnce Sorrel was discontinued in November 2021. 05/18/2020: Started monthly daratumumab and transitioned to q 3 month schedule in April 2022.  11/06/2022: Darzalex discontinued due to progression of disease. M protein 0.9 with worsening lytic lesions in the skeleton.  11/20/2022: Cycle 1 Day 1 of Kyprolis/Dex therapy.  12/19/2022: Cycle 2 Day 1 of Kyprolis/Dex therapy 01/15/2023: Cycle 3 Day 1 of Kyprolis/Dex therapy 02/19/2023: Cycle 4 Day 1 of Kyprolis/Dex therapy 03/18/2023: Cycle 5 Day 1 of Kyprolis/Dex therapy 04/25/2023: Cycle 6 Day 1 of Kyprolis/Dex therapy 05/28/2023: Cycle 7 Day 1 of Kyprolis/Dex therapy 06/11/2023: Cycle 8 Day 1 of Kyprolis/Dex therapy 07/08/2023 Cycle 9 Day 1 of Kyprolis/Dex therapy 09/11/2023 Cycle 10 Day 1 of Kyprolis/Dex therapy  HISTORY OF PRESENTING ILLNESS:  Nicole Keith 83 y.o. female returns for a follow up for IgG lambda multiple myeloma. She was last seen on 09/11/2023. She presents today for Cycle 10, Day 15 of Kyprolis/Dex.   On exam today, Nicole Keith reports she is tolerating treatment without any new symptoms. She started her biweekly infusion after the last  treatment and appreciates the time off. She does have persistent fatigue after each treatment and the extra week has helped with recovery. She reports her appetite is stable. She denies nausea, vomiting or bowel habit changes.  She denies fevers, chills, sweats, shortness of breath, chest pain or cough. She has no other complaints. Rest of the 10 point ROS is below.  MEDICAL HISTORY:  Past Medical History:  Diagnosis Date   Allergic rhinitis    Arthritis    Colitis, collagenous    Diverticular disease    severe sig tics/fixation 2012   Family hx of colon cancer    Fracture of femoral neck, right (HCC) 01/09/2013   GERD (gastroesophageal reflux disease)    exacerbation of reflux-like symptoms 04/2011   Hiatal hernia    Hyperlipidemia    muliplt myelom 2020   Osteoarthritis of right hip 01/10/2013    SURGICAL HISTORY: Past Surgical History:  Procedure Laterality Date   CHOLECYSTECTOMY     EYE SURGERY  feb 2016   cataract right eye   LEG SURGERY Left    TONSILLECTOMY     TOTAL HIP ARTHROPLASTY Right 01/10/2013   Procedure: TOTAL HIP ARTHROPLASTY;  Surgeon: Eulas Post, MD;  Location: [facility] OR;  Service: Orthopedics;  Laterality: Right;    SOCIAL HISTORY: Social History   Socioeconomic History   Marital status: Married    Spouse name: Not on file   Number of children: 0   Years of education: Not on file   Highest education level: Some college, no degree  Occupational History   Occupation: retired  Tobacco Use   Smoking status: Former  Current packs/day: 0.00    Types: Cigarettes    Quit date: 01/10/1972    Years since quitting: 51.7   Smokeless tobacco: Never   Tobacco comments:    quit 1973  Vaping Use   Vaping status: Never Used  Substance and Sexual Activity   Alcohol use: No   Drug use: No   Sexual activity: Not on file  Other Topics Concern   Not on file  Social History Narrative   Married, no children. Drinks 2 cups decaf coffee a day. Walks daily. Is  active, plays golf, she and her husband go hiking frequently. 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   Social Drivers of Corporate investment banker Strain: Not on file  Food Insecurity: No Food Insecurity (01/17/2023)   Hunger Vital Sign    Worried About Running Out of Food in the Last Year: Never true    Ran Out of Food in the Last Year: Never true  Transportation Needs: No Transportation Needs (01/17/2023)   PRAPARE - Administrator, Civil Service (Medical): No    Lack of Transportation (Non-Medical): No  Physical Activity: Not on file  Stress: Not on file  Social Connections: Not on file  Intimate Partner Violence: Not At Risk (01/17/2023)   Humiliation, Afraid, Rape, and Kick questionnaire    Fear of Current or Ex-Partner: No    Emotionally Abused: No    Physically Abused: No    Sexually Abused: No    FAMILY HISTORY: Family History  Problem Relation Age of Onset   Congestive Heart Failure Mother    Arthritis-Osteo Mother    Colon cancer Mother    Heart attack Father    Rheum arthritis Father    Colon cancer Maternal Aunt    Colon cancer Maternal Aunt    Hypothyroidism Sister     ALLERGIES:  is allergic to kyprolis [carfilzomib], pomalyst [pomalidomide], revlimid [lenalidomide], betadine [povidone iodine], flagyl [metronidazole], fosamax [alendronate sodium], bactrim [sulfamethoxazole-trimethoprim], clindamycin/lincomycin, doxepin hcl, hydrocodone-acetaminophen, hydroxyzine, hydroxyzine hcl, tramadol hcl, valium [diazepam], actonel [risedronate sodium], boniva [ibandronate], doxycycline, and penicillins.  MEDICATIONS:  Current Outpatient Medications  Medication Sig Dispense Refill   acetaminophen (TYLENOL) 500 MG tablet Take 1,000 mg by mouth See admin instructions. Take 1,000 mg by mouth after breakfast, at 3:30 PM, and at 8:30 PM     albuterol (VENTOLIN HFA) 108 (90 Base) MCG/ACT inhaler SMARTSIG:1 Puff(s) By Mouth Every 4-6 Hours PRN      augmented betamethasone dipropionate (DIPROLENE-AF) 0.05 % cream SMARTSIG:sparingly Topical Twice Daily PRN     famotidine (PEPCID) 20 MG tablet Take 1 tablet (20 mg total) by mouth daily. 30 tablet 0   famotidine-calcium carbonate-magnesium hydroxide (PEPCID COMPLETE) 10-800-165 MG chewable tablet Chew 1 tablet by mouth daily as needed (for heartburn or indigestion).      guaiFENesin (MUCINEX) 600 MG 12 hr tablet Take 1 tablet (600 mg total) by mouth 2 (two) times daily. 40 tablet 0   hydrocortisone 2.5 % cream Apply topically 2 (two) times daily.     loperamide (IMODIUM) 2 MG capsule Take 1 capsule (2 mg total) by mouth 3 (three) times daily between meals as needed for diarrhea or loose stools. 30 capsule 1   triamcinolone cream (KENALOG) 0.1 % Apply 1 Application topically 2 (two) times daily. (Patient taking differently: Apply 1 Application topically 2 (two) times daily as needed (for allergic flares- affected areas).) 453.6 g 2   VASELINE PURE ULTRA WHITE ointment Apply 1 application  topically See admin instructions. Apply to the genitalia in the morning and at bedtime     acyclovir (ZOVIRAX) 400 MG tablet Take 1 tablet (400 mg total) by mouth 2 (two) times daily. (Patient not taking: Reported on 01/16/2023) 60 tablet 3   No current facility-administered medications for this visit.   Facility-Administered Medications Ordered in Other Visits  Medication Dose Route Frequency Provider Last Rate Last Admin   carfilzomib (KYPROLIS) 120 mg in dextrose 5 % 100 mL chemo infusion  70 mg/m2 (Treatment Plan Recorded) Intravenous Once Jaci Standard, MD 320 mL/hr at 09/25/23 1530 120 mg at 09/25/23 1530    REVIEW OF SYSTEMS:   Constitutional: ( - ) fevers, ( - )  chills , ( - ) night sweats Eyes: ( - ) blurriness of vision, ( - ) double vision, ( - ) watery eyes Ears, nose, mouth, throat, and face: ( - ) mucositis, ( - ) sore throat Respiratory: ( - ) cough, ( - ) dyspnea, ( - )  wheezes Cardiovascular: ( - ) palpitation, ( - ) chest discomfort, ( - ) lower extremity swelling Gastrointestinal:  ( - ) nausea, ( - ) heartburn, ( - ) change in bowel habits Skin: ( +) abnormal skin rashes Lymphatics: ( - ) new lymphadenopathy, ( - ) easy bruising Neurological: ( - ) numbness, ( - ) tingling, ( - ) new weaknesses Behavioral/Psych: ( - ) mood change, ( - ) new changes  All other systems were reviewed with the patient and are negative.  PHYSICAL EXAMINATION: ECOG PERFORMANCE STATUS: 1 - Symptomatic but completely ambulatory  Vitals:   09/25/23 1341  BP: 136/74  Pulse: 64  Resp: 13  Temp: (!) 97.5 F (36.4 C)  SpO2: 99%     Filed Weights   09/25/23 1341  Weight: 126 lb 3.2 oz (57.2 kg)      GENERAL: well appearing female in NAD  SKIN: skin color, texture, turgor are normal. Diffuse, flat erythematous rash.  EYES: conjunctiva are pink and non-injected, sclera clear LYMPH:  no palpable lymphadenopathy in the cervical or supraclavicular lymph nodes.  LUNGS: clear to auscultation and percussion with normal breathing effort HEART: regular rate & rhythm and no murmurs and no lower extremity edema Musculoskeletal: no cyanosis of digits and no clubbing  PSYCH: alert & oriented x 3, fluent speech NEURO: no focal motor/sensory deficits  LABORATORY DATA:  I have reviewed the data as listed    Latest Ref Rng & Units 09/25/2023   12:50 PM 09/11/2023   10:48 AM 08/20/2023   12:26 PM  CBC  WBC 4.0 - 10.5 K/uL 11.3  9.1  10.3   Hemoglobin 12.0 - 15.0 g/dL 29.5  62.1  30.8   Hematocrit 36.0 - 46.0 % 40.5  42.4  40.6   Platelets 150 - 400 K/uL 537  345  233        Latest Ref Rng & Units 09/25/2023   12:50 PM 09/11/2023   10:48 AM 08/20/2023   12:26 PM  CMP  Glucose 70 - 99 mg/dL 85  82  81   BUN 8 - 23 mg/dL 13  14  12    Creatinine 0.44 - 1.00 mg/dL 6.57  8.46  9.62   Sodium 135 - 145 mmol/L 138  141  139   Potassium 3.5 - 5.1 mmol/L 4.3  4.2  4.2   Chloride 98  - 111 mmol/L 104  104  106   CO2 22 - 32  mmol/L 29  30  28    Calcium 8.9 - 10.3 mg/dL 9.5  9.4  9.3   Total Protein 6.5 - 8.1 g/dL 6.0  6.3  5.9   Total Bilirubin 0.0 - 1.2 mg/dL 0.4  0.5  0.5   Alkaline Phos 38 - 126 U/L 53  52  55   AST 15 - 41 U/L 19  20  25    ALT 0 - 44 U/L 18  14  22      ASSESSMENT & PLAN Nicole Keith is a 83 y.o.female who presents to the clinic for follow up for multiple myeloma.  #IgG Lambda Multiple Myeloma: --Initially presented in April 2016 with M spike of 3.2 g/dL and IgG level of 0865. Underwent bone marrow biopsy from 11/20/2014 show 29% plasma cells. 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  --In December 2019 with rising M spike of 5.9 g/dL and worsening anemia, with Hgb 8.7, she met criteria for transformation of active multiple myeloma.  --From 07/27/2018-09/2018, received weekly velcade plus dexamethasone. Therapy changed due to poor response.  --In 09/2018, started Revlimid 25 mg for 21 days on, 7 days off of a 28 day cycle with dexamethasone. Therapy was held after 2 cycles because of severe dermatological toxicity --In 12/2018, switched to Pomalyst 1 mg daily for 21 days.  She completed 4 cycles of therapy and therapy was held due to occurrence of dermatological toxicity.  --In 07/2019, switch to Ninlaro 4 mg weekly with dexamethasone 20 mg weekly for 3 weeks on and 1 week off. Daratumumab was addd in April 2021. Clearnce Sorrel was discontinued in November 2021. --On 05/18/2020, started monthly daratumumab and transitioned to q 3 month schedule in April 2022.  --On 11/20/2022, switch to Kyprolis/Dex due to rising M-protein and bone survey that showed worsening lytic lesions. --Switched to biweekly infusion of Kyprolis/Dex per patient request  as she wants to prioritize her quality of life PLAN:   --Due for cycle 10, Day 15 today. --Labs today show white blood cell count 11.3, hemoglobin 13.5, MCV 95.7, platelets 537. creatinine and LFTs  are normal.  --Most recent myeloma labs from 08/13/2023 showed improvement of M protein to 0.3 with normal serum free light chains. --RTC in 2 weeks for continuation of Kyprolis/Dex with continued biweekly treatments.   #Diffuse erythematous rash-stable --Managed by dermatology.  Patient prescribed 3 different steroid creams with instructions on how to use them which areas to apply. -- nystatin powder for groin area.   #Supportive Care -- port not required but can be placed if requested.  -- zofran 8mg  q8H PRN and compazine 10mg  PO q6H for nausea -- acyclovir 400mg  PO BID for VCZ prophylaxis -- no pain medication required at this time.  -- discussed role for zometa therapy in the setting her lytic lesions. Explained zometa is given q 12 weeks and requires a dental clearance.  After discussing this with the patient further today she knows she would like to hold on this for the time being as she is afraid of the side effects.  Strongly emphasized that she should reconsider as this will be helpful with her lytic bone lesions.  No orders of the defined types were placed in this encounter.  All questions were answered. The patient knows to call the clinic with any problems, questions or concerns.  I have spent a total of 30 minutes minutes of face-to-face and non-face-to-face time, preparing to see the patient,  performing a medically appropriate examination, counseling and educating the patient,  ordering medications/tests/procedures,documenting clinical information in the electronic health record, and care coordination.   Georga Kaufmann PA-C Dept of Hematology and Oncology [facility] at [facility] Phone: [phone]

## 2023-09-25 NOTE — Patient Instructions (Signed)
[facility] - A DEPT OF [facility]   Discharge Instructions: Thank you for choosing [facility] to provide your oncology and hematology care.   If you have a lab appointment with the Cancer Center, please go directly to the Cancer Center and check in at the registration area.   Wear comfortable clothing and clothing appropriate for easy access to any Portacath or PICC line.   We strive to give you quality time with your provider. You may need to reschedule your appointment if you arrive late (15 or more minutes).  Arriving late affects you and other patients whose appointments are after yours.  Also, if you miss three or more appointments without notifying the office, you may be dismissed from the clinic at the provider's discretion.      For prescription refill requests, have your pharmacy contact our office and allow 72 hours for refills to be completed.    Today you received the following chemotherapy and/or immunotherapy agents: Carfilzomib (Kyprolis)      To help prevent nausea and vomiting after your treatment, we encourage you to take your nausea medication as directed.  BELOW ARE SYMPTOMS THAT SHOULD BE REPORTED IMMEDIATELY: *FEVER GREATER THAN 100.4 F (38 C) OR HIGHER *CHILLS OR SWEATING *NAUSEA AND VOMITING THAT IS NOT CONTROLLED WITH YOUR NAUSEA MEDICATION *UNUSUAL SHORTNESS OF BREATH *UNUSUAL BRUISING OR BLEEDING *URINARY PROBLEMS (pain or burning when urinating, or frequent urination) *BOWEL PROBLEMS (unusual diarrhea, constipation, pain near the anus) TENDERNESS IN MOUTH AND THROAT WITH OR WITHOUT PRESENCE OF ULCERS (sore throat, sores in mouth, or a toothache) UNUSUAL RASH, SWELLING OR PAIN  UNUSUAL VAGINAL DISCHARGE OR ITCHING   Items with * indicate a potential emergency and should be followed up as soon as possible or go to the Emergency Department if any problems should occur.  Please show the CHEMOTHERAPY ALERT CARD or  IMMUNOTHERAPY ALERT CARD at check-in to the Emergency Department and triage nurse.  Should you have questions after your visit or need to cancel or reschedule your appointment, please contact [facility] - A DEPT OF [facility]  Dept: [phone]  and follow the prompts.  Office hours are 8:00 a.m. to 4:30 p.m. Monday - Friday. Please note that voicemails left after 4:00 p.m. may not be returned until the following business day.  We are closed weekends and major holidays. You have access to a nurse at all times for urgent questions. Please call the main number to the clinic Dept: [phone] and follow the prompts.   For any non-urgent questions, you may also contact your provider using MyChart. We now offer e-Visits for anyone 55 and older to request care online for non-urgent symptoms. For details visit [email or web address].   Also download the MyChart app! Go to the app store, search "MyChart", open the app, select [place], and log in with your MyChart username and password.

## 2023-09-25 NOTE — Progress Notes (Signed)
Patient noted pain at IV site. Infusion stopped and IV site assessed. Some blood noted under dressing. Site flushed with saline. No pain noted and blood return seen. Additional RN assessed and noted blood return as well. Patient comfortable to restart and noted that she may have pulled the line accidentally.  Prior to finishing infusion, patient noted that her veins above her IV site appeared red. Sleeve pulled up and veins more prominent. Infusion paused and Benedict Needy assessed IV site at chairside. Patient noted no pain at IV site. Per Benedict Needy - okay to restart and finish infusion.  Patient tolerated infusion with no further issues.  Patient educated to monitor site for any redness, swelling, or pain. Should she note any s/s she is to call the office for further evaluation.  Patient verbalized an understanding of the information. Patient d/c in stable condition.

## 2023-10-03 ENCOUNTER — Other Ambulatory Visit: Payer: Self-pay

## 2023-10-06 ENCOUNTER — Other Ambulatory Visit: Payer: Self-pay

## 2023-10-09 ENCOUNTER — Ambulatory Visit: Payer: Medicare Other

## 2023-10-09 ENCOUNTER — Inpatient Hospital Stay: Payer: Medicare Other | Admitting: Hematology and Oncology

## 2023-10-09 ENCOUNTER — Inpatient Hospital Stay: Payer: Medicare Other

## 2023-10-09 ENCOUNTER — Other Ambulatory Visit: Payer: Medicare Other

## 2023-10-09 ENCOUNTER — Ambulatory Visit: Payer: Medicare Other | Admitting: Hematology and Oncology

## 2023-10-09 VITALS — BP 150/70 | HR 69 | Temp 98.2°F | Resp 19 | Wt 127.2 lb

## 2023-10-09 DIAGNOSIS — Z5112 Encounter for antineoplastic immunotherapy: Secondary | ICD-10-CM | POA: Diagnosis not present

## 2023-10-09 DIAGNOSIS — C9000 Multiple myeloma not having achieved remission: Secondary | ICD-10-CM

## 2023-10-09 DIAGNOSIS — Z79899 Other long term (current) drug therapy: Secondary | ICD-10-CM | POA: Diagnosis not present

## 2023-10-09 LAB — CBC WITH DIFFERENTIAL (CANCER CENTER ONLY)
Abs Immature Granulocytes: 0.04 10*3/uL (ref 0.00–0.07)
Basophils Absolute: 0.1 10*3/uL (ref 0.0–0.1)
Basophils Relative: 1 %
Eosinophils Absolute: 0.3 10*3/uL (ref 0.0–0.5)
Eosinophils Relative: 3 %
HCT: 41.1 % (ref 36.0–46.0)
Hemoglobin: 13.5 g/dL (ref 12.0–15.0)
Immature Granulocytes: 0 %
Lymphocytes Relative: 19 %
Lymphs Abs: 1.9 10*3/uL (ref 0.7–4.0)
MCH: 31.6 pg (ref 26.0–34.0)
MCHC: 32.8 g/dL (ref 30.0–36.0)
MCV: 96.3 fL (ref 80.0–100.0)
Monocytes Absolute: 0.8 10*3/uL (ref 0.1–1.0)
Monocytes Relative: 8 %
Neutro Abs: 7 10*3/uL (ref 1.7–7.7)
Neutrophils Relative %: 69 %
Platelet Count: 460 10*3/uL — ABNORMAL HIGH (ref 150–400)
RBC: 4.27 MIL/uL (ref 3.87–5.11)
RDW: 12.7 % (ref 11.5–15.5)
WBC Count: 10.1 10*3/uL (ref 4.0–10.5)
nRBC: 0 % (ref 0.0–0.2)

## 2023-10-09 LAB — CMP (CANCER CENTER ONLY)
ALT: 15 U/L (ref 0–44)
AST: 18 U/L (ref 15–41)
Albumin: 4.1 g/dL (ref 3.5–5.0)
Alkaline Phosphatase: 54 U/L (ref 38–126)
Anion gap: 5 (ref 5–15)
BUN: 10 mg/dL (ref 8–23)
CO2: 29 mmol/L (ref 22–32)
Calcium: 9.3 mg/dL (ref 8.9–10.3)
Chloride: 103 mmol/L (ref 98–111)
Creatinine: 0.61 mg/dL (ref 0.44–1.00)
GFR, Estimated: >60 mL/min (ref >60)
Glucose, Bld: 86 mg/dL (ref 70–99)
Potassium: 4.4 mmol/L (ref 3.5–5.1)
Sodium: 137 mmol/L (ref 135–145)
Total Bilirubin: 0.6 mg/dL (ref 0.0–1.2)
Total Protein: 6.3 g/dL — ABNORMAL LOW (ref 6.5–8.1)

## 2023-10-09 MED ORDER — DEXTROSE 5 % IV SOLN
70.0000 mg/m2 | Freq: Once | INTRAVENOUS | Status: AC
  Administered 2023-10-09: 120 mg via INTRAVENOUS
  Filled 2023-10-09: qty 60

## 2023-10-09 MED ORDER — SODIUM CHLORIDE 0.9 % IV SOLN: Freq: Once | INTRAVENOUS | Status: AC

## 2023-10-09 MED ORDER — SODIUM CHLORIDE 0.9 % IV SOLN
40.0000 mg | Freq: Once | INTRAVENOUS | Status: AC
  Administered 2023-10-09: 40 mg via INTRAVENOUS
  Filled 2023-10-09: qty 4

## 2023-10-09 NOTE — Patient Instructions (Signed)

## 2023-10-09 NOTE — Progress Notes (Signed)
 [facility] [phone]   [phone]  PROGRESS NOTE  Patient Care Team: Farris Has, MD as PCP - General (Family Medicine)  CHIEF COMPLAINTS/PURPOSE OF CONSULTATION:  IgG Lambda Multiple Myeloma  ONCOLOGIC HISTORY: 07/27/2018-09/2018: Received weekly velcade plus dexamethasone. Therapy changed due to poor response.  09/2018: Started Revlimid 25 mg for 21 days on, 7 days off of a 28 day cycle with dexamethasone. Therapy was held after 2 cycles because of severe dermatological toxicity 12/2018: Pomalyst 1 mg daily for 21 days.  She completed 4 cycles of therapy and therapy was held due to occurrence of dermatological toxicity.  07/2019: Started Ninlaro 4 mg weekly with dexamethasone 20 mg weekly for 3 weeks on and 1 week off. Daratumumab was addd in April 2021. Clearnce Sorrel was discontinued in November 2021. 05/18/2020: Started monthly daratumumab and transitioned to q 3 month schedule in April 2022.  11/06/2022: Darzalex discontinued due to progression of disease. M protein 0.9 with worsening lytic lesions in the skeleton.  11/20/2022: Cycle 1 Day 1 of Kyprolis/Dex therapy.  12/19/2022: Cycle 2 Day 1 of Kyprolis/Dex therapy 01/15/2023: Cycle 3 Day 1 of Kyprolis/Dex therapy 02/19/2023: Cycle 4 Day 1 of Kyprolis/Dex therapy 03/18/2023: Cycle 5 Day 1 of Kyprolis/Dex therapy 04/25/2023: Cycle 6 Day 1 of Kyprolis/Dex therapy 05/28/2023: Cycle 7 Day 1 of Kyprolis/Dex therapy 06/11/2023: Cycle 8 Day 1 of Kyprolis/Dex therapy 07/08/2023 Cycle 9 Day 1 of Kyprolis/Dex therapy 09/11/2023 Cycle 10 Day 1 of Kyprolis/Dex therapy 10/09/2023: Cycle 11 Day 1 of Kyprolis/Dex therapy  HISTORY OF PRESENTING ILLNESS:  Nicole Keith 83 y.o. female returns for a follow up for IgG lambda multiple myeloma. She was last seen on 09/25/2023. She presents today for Cycle 11, Day 1 of Kyprolis/Dex.   On exam today, Nicole Keith reports she feels "down".  She reports that she continues to  struggle with a rash but the 3 creams prescribed by dermatology 2.  Be helping.  She reports that she continues to have that chemo brain fog with some memory loss.  She is also having some eye issues which she will have evaluated by ophthalmology.  She reports her bowels moving daily with no loose stools or diarrhea.  She denies any nausea or vomiting.  She reports overall she is eating well and has a strong appetite.  She continues to want to stretch out her chemotherapy visits further and further but we continue to emphasize the importance of compliance with the current scheduling.  She notes that she is willing and able to proceed with chemotherapy today.  She denies fevers, chills, sweats, shortness of breath, chest pain or cough. She has no other complaints. Rest of the 10 point ROS is below.  MEDICAL HISTORY:  Past Medical History:  Diagnosis Date   Allergic rhinitis    Arthritis    Colitis, collagenous    Diverticular disease    severe sig tics/fixation 2012   Family hx of colon cancer    Fracture of femoral neck, right (HCC) 01/09/2013   GERD (gastroesophageal reflux disease)    exacerbation of reflux-like symptoms 04/2011   Hiatal hernia    Hyperlipidemia    muliplt myelom 2020   Osteoarthritis of right hip 01/10/2013    SURGICAL HISTORY: Past Surgical History:  Procedure Laterality Date   CHOLECYSTECTOMY     EYE SURGERY  feb 2016   cataract right eye   LEG SURGERY Left    TONSILLECTOMY     TOTAL HIP ARTHROPLASTY Right 01/10/2013  Procedure: TOTAL HIP ARTHROPLASTY;  Surgeon: Eulas Post, MD;  Location: [facility] OR;  Service: Orthopedics;  Laterality: Right;    SOCIAL HISTORY: Social History   Socioeconomic History   Marital status: Married    Spouse name: Not on file   Number of children: 0   Years of education: Not on file   Highest education level: Some college, no degree  Occupational History   Occupation: retired  Tobacco Use   Smoking status: Former    Current  packs/day: 0.00    Types: Cigarettes    Quit date: 01/10/1972    Years since quitting: 51.7   Smokeless tobacco: Never   Tobacco comments:    quit 1973  Vaping Use   Vaping status: Never Used  Substance and Sexual Activity   Alcohol use: No   Drug use: No   Sexual activity: Not on file  Other Topics Concern   Not on file  Social History Narrative   Married, no children. Drinks 2 cups decaf coffee a day. Walks daily. Is active, plays golf, she and her husband go hiking frequently. 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   Social Drivers of Corporate investment banker Strain: Not on file  Food Insecurity: No Food Insecurity (01/17/2023)   Hunger Vital Sign    Worried About Running Out of Food in the Last Year: Never true    Ran Out of Food in the Last Year: Never true  Transportation Needs: No Transportation Needs (01/17/2023)   PRAPARE - Administrator, Civil Service (Medical): No    Lack of Transportation (Non-Medical): No  Physical Activity: Not on file  Stress: Not on file  Social Connections: Not on file  Intimate Partner Violence: Not At Risk (01/17/2023)   Humiliation, Afraid, Rape, and Kick questionnaire    Fear of Current or Ex-Partner: No    Emotionally Abused: No    Physically Abused: No    Sexually Abused: No    FAMILY HISTORY: Family History  Problem Relation Age of Onset   Congestive Heart Failure Mother    Arthritis-Osteo Mother    Colon cancer Mother    Heart attack Father    Rheum arthritis Father    Colon cancer Maternal Aunt    Colon cancer Maternal Aunt    Hypothyroidism Sister     ALLERGIES:  is allergic to kyprolis [carfilzomib], pomalyst [pomalidomide], revlimid [lenalidomide], betadine [povidone iodine], flagyl [metronidazole], fosamax [alendronate sodium], bactrim [sulfamethoxazole-trimethoprim], clindamycin/lincomycin, doxepin hcl, hydrocodone-acetaminophen, hydroxyzine, hydroxyzine hcl, tramadol hcl, valium [diazepam],  actonel [risedronate sodium], boniva [ibandronate], doxycycline, and penicillins.  MEDICATIONS:  Current Outpatient Medications  Medication Sig Dispense Refill   acetaminophen (TYLENOL) 500 MG tablet Take 1,000 mg by mouth See admin instructions. Take 1,000 mg by mouth after breakfast, at 3:30 PM, and at 8:30 PM     acyclovir (ZOVIRAX) 400 MG tablet Take 1 tablet (400 mg total) by mouth 2 (two) times daily. (Patient not taking: Reported on 01/16/2023) 60 tablet 3   albuterol (VENTOLIN HFA) 108 (90 Base) MCG/ACT inhaler SMARTSIG:1 Puff(s) By Mouth Every 4-6 Hours PRN     augmented betamethasone dipropionate (DIPROLENE-AF) 0.05 % cream SMARTSIG:sparingly Topical Twice Daily PRN     betamethasone dipropionate (DIPROLENE) 0.05 % ointment Apply 50 g topically 2 (two) times daily.     famotidine (PEPCID) 20 MG tablet Take 1 tablet (20 mg total) by mouth daily. 30 tablet 0   famotidine-calcium carbonate-magnesium hydroxide (PEPCID COMPLETE) 10-800-165 MG chewable tablet  Chew 1 tablet by mouth daily as needed (for heartburn or indigestion).      guaiFENesin (MUCINEX) 600 MG 12 hr tablet Take 1 tablet (600 mg total) by mouth 2 (two) times daily. 40 tablet 0   hydrocortisone 2.5 % cream Apply topically 2 (two) times daily.     loperamide (IMODIUM) 2 MG capsule Take 1 capsule (2 mg total) by mouth 3 (three) times daily between meals as needed for diarrhea or loose stools. 30 capsule 1   triamcinolone cream (KENALOG) 0.1 % Apply 1 Application topically 2 (two) times daily. (Patient taking differently: Apply 1 Application topically 2 (two) times daily as needed (for allergic flares- affected areas).) 453.6 g 2   VASELINE PURE ULTRA WHITE ointment Apply 1 application  topically See admin instructions. Apply to the genitalia in the morning and at bedtime     No current facility-administered medications for this visit.    REVIEW OF SYSTEMS:   Constitutional: ( - ) fevers, ( - )  chills , ( - ) night  sweats Eyes: ( - ) blurriness of vision, ( - ) double vision, ( - ) watery eyes Ears, nose, mouth, throat, and face: ( - ) mucositis, ( - ) sore throat Respiratory: ( - ) cough, ( - ) dyspnea, ( - ) wheezes Cardiovascular: ( - ) palpitation, ( - ) chest discomfort, ( - ) lower extremity swelling Gastrointestinal:  ( - ) nausea, ( - ) heartburn, ( - ) change in bowel habits Skin: ( +) abnormal skin rashes Lymphatics: ( - ) new lymphadenopathy, ( - ) easy bruising Neurological: ( - ) numbness, ( - ) tingling, ( - ) new weaknesses Behavioral/Psych: ( - ) mood change, ( - ) new changes  All other systems were reviewed with the patient and are negative.  PHYSICAL EXAMINATION: ECOG PERFORMANCE STATUS: 1 - Symptomatic but completely ambulatory  There were no vitals filed for this visit.    There were no vitals filed for this visit.     GENERAL: well appearing female in NAD  SKIN: skin color, texture, turgor are normal. Diffuse, flat erythematous rash.  EYES: conjunctiva are pink and non-injected, sclera clear LYMPH:  no palpable lymphadenopathy in the cervical or supraclavicular lymph nodes.  LUNGS: clear to auscultation and percussion with normal breathing effort HEART: regular rate & rhythm and no murmurs and no lower extremity edema Musculoskeletal: no cyanosis of digits and no clubbing  PSYCH: alert & oriented x 3, fluent speech NEURO: no focal motor/sensory deficits  LABORATORY DATA:  I have reviewed the data as listed    Latest Ref Rng & Units 10/09/2023    1:32 PM 09/25/2023   12:50 PM 09/11/2023   10:48 AM  CBC  WBC 4.0 - 10.5 K/uL 10.1  11.3  9.1   Hemoglobin 12.0 - 15.0 g/dL 56.4  33.2  95.1   Hematocrit 36.0 - 46.0 % 41.1  40.5  42.4   Platelets 150 - 400 K/uL 460  537  345        Latest Ref Rng & Units 10/09/2023    1:32 PM 09/25/2023   12:50 PM 09/11/2023   10:48 AM  CMP  Glucose 70 - 99 mg/dL 86  85  82   BUN 8 - 23 mg/dL 10  13  14    Creatinine 0.44 - 1.00  mg/dL 8.84  1.66  0.63   Sodium 135 - 145 mmol/L 137  138  141   Potassium 3.5 -  5.1 mmol/L 4.4  4.3  4.2   Chloride 98 - 111 mmol/L 103  104  104   CO2 22 - 32 mmol/L 29  29  30    Calcium 8.9 - 10.3 mg/dL 9.3  9.5  9.4   Total Protein 6.5 - 8.1 g/dL 6.3  6.0  6.3   Total Bilirubin 0.0 - 1.2 mg/dL 0.6  0.4  0.5   Alkaline Phos 38 - 126 U/L 54  53  52   AST 15 - 41 U/L 18  19  20    ALT 0 - 44 U/L 15  18  14      ASSESSMENT & PLAN Nicole Keith is a 83 y.o.female who presents to the clinic for follow up for multiple myeloma.  #IgG Lambda Multiple Myeloma: --Initially presented in April 2016 with M spike of 3.2 g/dL and IgG level of 9604. Underwent bone marrow biopsy from 11/20/2014 show 29% plasma cells. 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  --In December 2019 with rising M spike of 5.9 g/dL and worsening anemia, with Hgb 8.7, she met criteria for transformation of active multiple myeloma.  --From 07/27/2018-09/2018, received weekly velcade plus dexamethasone. Therapy changed due to poor response.  --In 09/2018, started Revlimid 25 mg for 21 days on, 7 days off of a 28 day cycle with dexamethasone. Therapy was held after 2 cycles because of severe dermatological toxicity --In 12/2018, switched to Pomalyst 1 mg daily for 21 days.  She completed 4 cycles of therapy and therapy was held due to occurrence of dermatological toxicity.  --In 07/2019, switch to Ninlaro 4 mg weekly with dexamethasone 20 mg weekly for 3 weeks on and 1 week off. Daratumumab was addd in April 2021. Clearnce Sorrel was discontinued in November 2021. --On 05/18/2020, started monthly daratumumab and transitioned to q 3 month schedule in April 2022.  --On 11/20/2022, switch to Kyprolis/Dex due to rising M-protein and bone survey that showed worsening lytic lesions. --Switched to biweekly infusion of Kyprolis/Dex per patient request  as she wants to prioritize her quality of life PLAN:   --Due for cycle 11, Day  1 today. --Labs today show white blood cell count 10.1, hemoglobin 13.5, MCV 96.3, platelets 460. creatinine and LFTs are normal.  --Most recent myeloma labs from 08/13/2023 showed improvement of M protein to 0.3 with normal serum free light chains. --RTC in 2 weeks for continuation of Kyprolis/Dex with continued biweekly treatments.   #Diffuse erythematous rash-stable --Managed by dermatology.  Patient prescribed 3 different steroid creams with instructions on how to use them which areas to apply. -- nystatin powder for groin area.   #Supportive Care -- port not required but can be placed if requested.  -- zofran 8mg  q8H PRN and compazine 10mg  PO q6H for nausea -- acyclovir 400mg  PO BID for VCZ prophylaxis -- no pain medication required at this time.  -- discussed role for zometa therapy in the setting her lytic lesions. Explained zometa is given q 12 weeks and requires a dental clearance.  After discussing this with the patient further today she knows she would like to hold on this for the time being as she is afraid of the side effects.  Strongly emphasized that she should reconsider as this will be helpful with her lytic bone lesions.  No orders of the defined types were placed in this encounter.  All questions were answered. The patient knows to call the clinic with any problems, questions or concerns.  I have spent a total  of 30 minutes minutes of face-to-face and non-face-to-face time, preparing to see the patient,  performing a medically appropriate examination, counseling and educating the patient, ordering medications/tests/procedures,documenting clinical information in the electronic health record, and care coordination.   Ulysees Barns, MD Department of Hematology/Oncology [facility] at [facility] Phone: [phone] Pager: [phone] Email: [email or web address]

## 2023-10-10 ENCOUNTER — Encounter: Payer: Self-pay | Admitting: Hematology and Oncology

## 2023-10-12 LAB — KAPPA/LAMBDA LIGHT CHAINS
Kappa free light chain: 1.4 mg/L — ABNORMAL LOW (ref 3.3–19.4)
Kappa, lambda light chain ratio: 0.14 — ABNORMAL LOW (ref 0.26–1.65)
Lambda free light chains: 9.8 mg/L (ref 5.7–26.3)

## 2023-10-13 ENCOUNTER — Other Ambulatory Visit (HOSPITAL_COMMUNITY): Payer: Self-pay

## 2023-10-13 LAB — MULTIPLE MYELOMA PANEL, SERUM
Albumin SerPl Elph-Mcnc: 3.7 g/dL (ref 2.9–4.4)
Albumin/Glob SerPl: 1.7 (ref 0.7–1.7)
Alpha 1: 0.3 g/dL (ref 0.0–0.4)
Alpha2 Glob SerPl Elph-Mcnc: 0.6 g/dL (ref 0.4–1.0)
B-Globulin SerPl Elph-Mcnc: 0.8 g/dL (ref 0.7–1.3)
Gamma Glob SerPl Elph-Mcnc: 0.5 g/dL (ref 0.4–1.8)
Globulin, Total: 2.2 g/dL (ref 2.2–3.9)
IgA: 10 mg/dL — ABNORMAL LOW (ref 64–422)
IgG (Immunoglobin G), Serum: 581 mg/dL — ABNORMAL LOW (ref 586–1602)
IgM (Immunoglobulin M), Srm: 8 mg/dL — ABNORMAL LOW (ref 26–217)
M Protein SerPl Elph-Mcnc: 0.3 g/dL — ABNORMAL HIGH
Total Protein ELP: 5.9 g/dL — ABNORMAL LOW (ref 6.0–8.5)

## 2023-10-16 ENCOUNTER — Other Ambulatory Visit: Payer: Medicare Other

## 2023-10-16 ENCOUNTER — Telehealth: Payer: Self-pay | Admitting: Hematology and Oncology

## 2023-10-16 ENCOUNTER — Ambulatory Visit: Payer: Medicare Other

## 2023-10-16 NOTE — Telephone Encounter (Signed)
 Rescheduled appointments per patients request via incoming call. Patient is aware of the changes made to her upcoming appointments.

## 2023-10-19 ENCOUNTER — Telehealth: Payer: Self-pay | Admitting: *Deleted

## 2023-10-19 NOTE — Telephone Encounter (Signed)
 Received vm message from pt regarding upcoming appts.  TCT patient. No answer but was able to leave detailed message on her identified vm regading her appts here on Friday, 10/23/23. Advised to call back if she had any further questions.

## 2023-10-20 DIAGNOSIS — L738 Other specified follicular disorders: Secondary | ICD-10-CM | POA: Diagnosis not present

## 2023-10-20 DIAGNOSIS — Z85828 Personal history of other malignant neoplasm of skin: Secondary | ICD-10-CM | POA: Diagnosis not present

## 2023-10-20 DIAGNOSIS — L309 Dermatitis, unspecified: Secondary | ICD-10-CM | POA: Diagnosis not present

## 2023-10-20 DIAGNOSIS — L300 Nummular dermatitis: Secondary | ICD-10-CM | POA: Diagnosis not present

## 2023-10-20 DIAGNOSIS — L821 Other seborrheic keratosis: Secondary | ICD-10-CM | POA: Diagnosis not present

## 2023-10-23 ENCOUNTER — Inpatient Hospital Stay: Payer: Medicare Other

## 2023-10-23 ENCOUNTER — Inpatient Hospital Stay: Payer: Medicare Other | Attending: Physician Assistant | Admitting: Hematology and Oncology

## 2023-10-23 ENCOUNTER — Inpatient Hospital Stay: Payer: Medicare Other | Attending: Physician Assistant

## 2023-10-23 VITALS — BP 145/62 | HR 62 | Temp 98.3°F | Resp 13 | Wt 125.4 lb

## 2023-10-23 DIAGNOSIS — C9000 Multiple myeloma not having achieved remission: Secondary | ICD-10-CM

## 2023-10-23 DIAGNOSIS — Z79899 Other long term (current) drug therapy: Secondary | ICD-10-CM | POA: Insufficient documentation

## 2023-10-23 DIAGNOSIS — Z5112 Encounter for antineoplastic immunotherapy: Secondary | ICD-10-CM | POA: Insufficient documentation

## 2023-10-23 LAB — CMP (CANCER CENTER ONLY)
ALT: 16 U/L (ref 0–44)
AST: 18 U/L (ref 15–41)
Albumin: 4 g/dL (ref 3.5–5.0)
Alkaline Phosphatase: 53 U/L (ref 38–126)
Anion gap: 7 (ref 5–15)
BUN: 11 mg/dL (ref 8–23)
CO2: 28 mmol/L (ref 22–32)
Calcium: 8.7 mg/dL — ABNORMAL LOW (ref 8.9–10.3)
Chloride: 105 mmol/L (ref 98–111)
Creatinine: 0.67 mg/dL (ref 0.44–1.00)
GFR, Estimated: >60 mL/min (ref >60)
Glucose, Bld: 105 mg/dL — ABNORMAL HIGH (ref 70–99)
Potassium: 4.3 mmol/L (ref 3.5–5.1)
Sodium: 140 mmol/L (ref 135–145)
Total Bilirubin: 0.7 mg/dL (ref 0.0–1.2)
Total Protein: 6.1 g/dL — ABNORMAL LOW (ref 6.5–8.1)

## 2023-10-23 LAB — CBC WITH DIFFERENTIAL (CANCER CENTER ONLY)
Abs Immature Granulocytes: 0.06 10*3/uL (ref 0.00–0.07)
Basophils Absolute: 0.1 10*3/uL (ref 0.0–0.1)
Basophils Relative: 1 %
Eosinophils Absolute: 0.3 10*3/uL (ref 0.0–0.5)
Eosinophils Relative: 2 %
HCT: 40.5 % (ref 36.0–46.0)
Hemoglobin: 13.6 g/dL (ref 12.0–15.0)
Immature Granulocytes: 1 %
Lymphocytes Relative: 13 %
Lymphs Abs: 1.4 10*3/uL (ref 0.7–4.0)
MCH: 31.6 pg (ref 26.0–34.0)
MCHC: 33.6 g/dL (ref 30.0–36.0)
MCV: 94.2 fL (ref 80.0–100.0)
Monocytes Absolute: 0.8 10*3/uL (ref 0.1–1.0)
Monocytes Relative: 7 %
Neutro Abs: 8 10*3/uL — ABNORMAL HIGH (ref 1.7–7.7)
Neutrophils Relative %: 76 %
Platelet Count: 451 10*3/uL — ABNORMAL HIGH (ref 150–400)
RBC: 4.3 MIL/uL (ref 3.87–5.11)
RDW: 13 % (ref 11.5–15.5)
WBC Count: 10.6 10*3/uL — ABNORMAL HIGH (ref 4.0–10.5)
nRBC: 0 % (ref 0.0–0.2)

## 2023-10-23 MED ORDER — SODIUM CHLORIDE 0.9 % IV SOLN: Freq: Once | INTRAVENOUS | Status: AC

## 2023-10-23 MED ORDER — DEXTROSE 5 % IV SOLN
70.0000 mg/m2 | Freq: Once | INTRAVENOUS | Status: AC
Start: 2023-10-23 — End: 2023-10-23
  Administered 2023-10-23: 120 mg via INTRAVENOUS
  Filled 2023-10-23: qty 60

## 2023-10-23 MED ORDER — SODIUM CHLORIDE 0.9 % IV SOLN
40.0000 mg | Freq: Once | INTRAVENOUS | Status: AC
  Administered 2023-10-23: 40 mg via INTRAVENOUS
  Filled 2023-10-23: qty 4

## 2023-10-23 NOTE — Progress Notes (Signed)
 [facility] [phone]   [phone]  PROGRESS NOTE  Patient Care Team: Farris Has, MD as PCP - General (Family Medicine)  CHIEF COMPLAINTS/PURPOSE OF CONSULTATION:  IgG Lambda Multiple Myeloma  ONCOLOGIC HISTORY: 07/27/2018-09/2018: Received weekly velcade plus dexamethasone. Therapy changed due to poor response.  09/2018: Started Revlimid 25 mg for 21 days on, 7 days off of a 28 day cycle with dexamethasone. Therapy was held after 2 cycles because of severe dermatological toxicity 12/2018: Pomalyst 1 mg daily for 21 days.  She completed 4 cycles of therapy and therapy was held due to occurrence of dermatological toxicity.  07/2019: Started Ninlaro 4 mg weekly with dexamethasone 20 mg weekly for 3 weeks on and 1 week off. Daratumumab was addd in April 2021. Clearnce Sorrel was discontinued in November 2021. 05/18/2020: Started monthly daratumumab and transitioned to q 3 month schedule in April 2022.  11/06/2022: Darzalex discontinued due to progression of disease. M protein 0.9 with worsening lytic lesions in the skeleton.  11/20/2022: Cycle 1 Day 1 of Kyprolis/Dex therapy.  12/19/2022: Cycle 2 Day 1 of Kyprolis/Dex therapy 01/15/2023: Cycle 3 Day 1 of Kyprolis/Dex therapy 02/19/2023: Cycle 4 Day 1 of Kyprolis/Dex therapy 03/18/2023: Cycle 5 Day 1 of Kyprolis/Dex therapy 04/25/2023: Cycle 6 Day 1 of Kyprolis/Dex therapy 05/28/2023: Cycle 7 Day 1 of Kyprolis/Dex therapy 06/11/2023: Cycle 8 Day 1 of Kyprolis/Dex therapy 07/08/2023 Cycle 9 Day 1 of Kyprolis/Dex therapy 09/11/2023 Cycle 10 Day 1 of Kyprolis/Dex therapy 10/09/2023: Cycle 11 Day 1 of Kyprolis/Dex therapy  HISTORY OF PRESENTING ILLNESS:  Nicole Keith 83 y.o. female returns for a follow up for IgG lambda multiple myeloma. She was last seen on 09/25/2023. She presents today for Cycle 11, Day 1 of Kyprolis/Dex.   On exam today, Nicole Keith reports she has been walking some "weather permitting" around  [place].  She reports that she is feeling well overall.  She notes that she has had some loose stools and diarrhea but responded well to Imodium.  She reports that the stools can be watery but no black or blood in the stools.  She reports that she is having some cramping with the weather changes and is feeling it in the arthritis in her hands.  She reports that it did keep her up somewhat last night.  She reports he continues to have memory issues with chemo brain and fog.  Overall she is at her baseline and willing and able to proceed with treatment today.  She denies fevers, chills, sweats, shortness of breath, chest pain or cough. She has no other complaints. Rest of the 10 point ROS is below.  MEDICAL HISTORY:  Past Medical History:  Diagnosis Date   Allergic rhinitis    Arthritis    Colitis, collagenous    Diverticular disease    severe sig tics/fixation 2012   Family hx of colon cancer    Fracture of femoral neck, right (HCC) 01/09/2013   GERD (gastroesophageal reflux disease)    exacerbation of reflux-like symptoms 04/2011   Hiatal hernia    Hyperlipidemia    muliplt myelom 2020   Osteoarthritis of right hip 01/10/2013    SURGICAL HISTORY: Past Surgical History:  Procedure Laterality Date   CHOLECYSTECTOMY     EYE SURGERY  feb 2016   cataract right eye   LEG SURGERY Left    TONSILLECTOMY     TOTAL HIP ARTHROPLASTY Right 01/10/2013   Procedure: TOTAL HIP ARTHROPLASTY;  Surgeon: Eulas Post, MD;  Location:  [facility] OR;  Service: Orthopedics;  Laterality: Right;    SOCIAL HISTORY: Social History   Socioeconomic History   Marital status: Married    Spouse name: Not on file   Number of children: 0   Years of education: Not on file   Highest education level: Some college, no degree  Occupational History   Occupation: retired  Tobacco Use   Smoking status: Former    Current packs/day: 0.00    Types: Cigarettes    Quit date: 01/10/1972    Years since quitting: 51.8    Smokeless tobacco: Never   Tobacco comments:    quit 1973  Vaping Use   Vaping status: Never Used  Substance and Sexual Activity   Alcohol use: No   Drug use: No   Sexual activity: Not on file  Other Topics Concern   Not on file  Social History Narrative   Married, no children. Drinks 2 cups decaf coffee a day. Walks daily. Is active, plays golf, she and her husband go hiking frequently. 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   Social Drivers of Corporate investment banker Strain: Not on file  Food Insecurity: No Food Insecurity (01/17/2023)   Hunger Vital Sign    Worried About Running Out of Food in the Last Year: Never true    Ran Out of Food in the Last Year: Never true  Transportation Needs: No Transportation Needs (01/17/2023)   PRAPARE - Administrator, Civil Service (Medical): No    Lack of Transportation (Non-Medical): No  Physical Activity: Not on file  Stress: Not on file  Social Connections: Not on file  Intimate Partner Violence: Not At Risk (01/17/2023)   Humiliation, Afraid, Rape, and Kick questionnaire    Fear of Current or Ex-Partner: No    Emotionally Abused: No    Physically Abused: No    Sexually Abused: No    FAMILY HISTORY: Family History  Problem Relation Age of Onset   Congestive Heart Failure Mother    Arthritis-Osteo Mother    Colon cancer Mother    Heart attack Father    Rheum arthritis Father    Colon cancer Maternal Aunt    Colon cancer Maternal Aunt    Hypothyroidism Sister     ALLERGIES:  is allergic to kyprolis [carfilzomib], pomalyst [pomalidomide], revlimid [lenalidomide], betadine [povidone iodine], flagyl [metronidazole], fosamax [alendronate sodium], bactrim [sulfamethoxazole-trimethoprim], clindamycin/lincomycin, doxepin hcl, hydrocodone-acetaminophen, hydroxyzine, hydroxyzine hcl, tramadol hcl, valium [diazepam], actonel [risedronate sodium], boniva [ibandronate], doxycycline, and penicillins.  MEDICATIONS:   Current Outpatient Medications  Medication Sig Dispense Refill   acetaminophen (TYLENOL) 500 MG tablet Take 1,000 mg by mouth See admin instructions. Take 1,000 mg by mouth after breakfast, at 3:30 PM, and at 8:30 PM     acyclovir (ZOVIRAX) 400 MG tablet Take 1 tablet (400 mg total) by mouth 2 (two) times daily. (Patient not taking: Reported on 01/16/2023) 60 tablet 3   albuterol (VENTOLIN HFA) 108 (90 Base) MCG/ACT inhaler SMARTSIG:1 Puff(s) By Mouth Every 4-6 Hours PRN     augmented betamethasone dipropionate (DIPROLENE-AF) 0.05 % cream SMARTSIG:sparingly Topical Twice Daily PRN     famotidine (PEPCID) 20 MG tablet Take 1 tablet (20 mg total) by mouth daily. 30 tablet 0   famotidine-calcium carbonate-magnesium hydroxide (PEPCID COMPLETE) 10-800-165 MG chewable tablet Chew 1 tablet by mouth daily as needed (for heartburn or indigestion).      guaiFENesin (MUCINEX) 600 MG 12 hr tablet Take 1 tablet (600 mg total)  by mouth 2 (two) times daily. 40 tablet 0   hydrocortisone 2.5 % cream Apply topically 2 (two) times daily.     loperamide (IMODIUM) 2 MG capsule Take 1 capsule (2 mg total) by mouth 3 (three) times daily between meals as needed for diarrhea or loose stools. 30 capsule 1   triamcinolone cream (KENALOG) 0.1 % Apply 1 Application topically 2 (two) times daily. (Patient taking differently: Apply 1 Application topically 2 (two) times daily as needed (for allergic flares- affected areas).) 453.6 g 2   VASELINE PURE ULTRA WHITE ointment Apply 1 application  topically See admin instructions. Apply to the genitalia in the morning and at bedtime     No current facility-administered medications for this visit.    REVIEW OF SYSTEMS:   Constitutional: ( - ) fevers, ( - )  chills , ( - ) night sweats Eyes: ( - ) blurriness of vision, ( - ) double vision, ( - ) watery eyes Ears, nose, mouth, throat, and face: ( - ) mucositis, ( - ) sore throat Respiratory: ( - ) cough, ( - ) dyspnea, ( - )  wheezes Cardiovascular: ( - ) palpitation, ( - ) chest discomfort, ( - ) lower extremity swelling Gastrointestinal:  ( - ) nausea, ( - ) heartburn, ( - ) change in bowel habits Skin: ( +) abnormal skin rashes Lymphatics: ( - ) new lymphadenopathy, ( - ) easy bruising Neurological: ( - ) numbness, ( - ) tingling, ( - ) new weaknesses Behavioral/Psych: ( - ) mood change, ( - ) new changes  All other systems were reviewed with the patient and are negative.  PHYSICAL EXAMINATION: ECOG PERFORMANCE STATUS: 1 - Symptomatic but completely ambulatory  Vitals:   10/23/23 0915  BP: (!) 145/62  Pulse: 62  Resp: 13  Temp: 98.3 F (36.8 C)  SpO2: 98%      Filed Weights   10/23/23 0915  Weight: 125 lb 6.4 oz (56.9 kg)       GENERAL: well appearing female in NAD  SKIN: skin color, texture, turgor are normal. Diffuse, flat erythematous rash.  EYES: conjunctiva are pink and non-injected, sclera clear LYMPH:  no palpable lymphadenopathy in the cervical or supraclavicular lymph nodes.  LUNGS: clear to auscultation and percussion with normal breathing effort HEART: regular rate & rhythm and no murmurs and no lower extremity edema Musculoskeletal: no cyanosis of digits and no clubbing  PSYCH: alert & oriented x 3, fluent speech NEURO: no focal motor/sensory deficits  LABORATORY DATA:  I have reviewed the data as listed    Latest Ref Rng & Units 10/23/2023    8:45 AM 10/09/2023    1:32 PM 09/25/2023   12:50 PM  CBC  WBC 4.0 - 10.5 K/uL 10.6  10.1  11.3   Hemoglobin 12.0 - 15.0 g/dL 82.9  56.2  13.0   Hematocrit 36.0 - 46.0 % 40.5  41.1  40.5   Platelets 150 - 400 K/uL 451  460  537        Latest Ref Rng & Units 10/23/2023    8:45 AM 10/09/2023    1:32 PM 09/25/2023   12:50 PM  CMP  Glucose 70 - 99 mg/dL 865  86  85   BUN 8 - 23 mg/dL 11  10  13    Creatinine 0.44 - 1.00 mg/dL 7.84  6.96  2.95   Sodium 135 - 145 mmol/L 140  137  138   Potassium 3.5 - 5.1 mmol/L  4.3  4.4  4.3    Chloride 98 - 111 mmol/L 105  103  104   CO2 22 - 32 mmol/L 28  29  29    Calcium 8.9 - 10.3 mg/dL 8.7  9.3  9.5   Total Protein 6.5 - 8.1 g/dL 6.1  6.3  6.0   Total Bilirubin 0.0 - 1.2 mg/dL 0.7  0.6  0.4   Alkaline Phos 38 - 126 U/L 53  54  53   AST 15 - 41 U/L 18  18  19    ALT 0 - 44 U/L 16  15  18      ASSESSMENT & PLAN Nicole Keith is a 83 y.o.female who presents to the clinic for follow up for multiple myeloma.  #IgG Lambda Multiple Myeloma: --Initially presented in April 2016 with M spike of 3.2 g/dL and IgG level of 0865. Underwent bone marrow biopsy from 11/20/2014 show 29% plasma cells. 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  --In December 2019 with rising M spike of 5.9 g/dL and worsening anemia, with Hgb 8.7, she met criteria for transformation of active multiple myeloma.  --From 07/27/2018-09/2018, received weekly velcade plus dexamethasone. Therapy changed due to poor response.  --In 09/2018, started Revlimid 25 mg for 21 days on, 7 days off of a 28 day cycle with dexamethasone. Therapy was held after 2 cycles because of severe dermatological toxicity --In 12/2018, switched to Pomalyst 1 mg daily for 21 days.  She completed 4 cycles of therapy and therapy was held due to occurrence of dermatological toxicity.  --In 07/2019, switch to Ninlaro 4 mg weekly with dexamethasone 20 mg weekly for 3 weeks on and 1 week off. Daratumumab was addd in April 2021. Clearnce Sorrel was discontinued in November 2021. --On 05/18/2020, started monthly daratumumab and transitioned to q 3 month schedule in April 2022.  --On 11/20/2022, switch to Kyprolis/Dex due to rising M-protein and bone survey that showed worsening lytic lesions. --Switched to biweekly infusion of Kyprolis/Dex per patient request  as she wants to prioritize her quality of life PLAN:   --Due for cycle 11, Day 15 today. --Labs today show white blood cell count 10.6, hemoglobin 13.6, MCV 94.2, platelets 451 --Most  recent myeloma labs from 08/13/2023 showed improvement of M protein to 0.3 with normal serum free light chains. --RTC in 2 weeks for continuation of Kyprolis/Dex with continued biweekly treatments.   #Diffuse erythematous rash-stable --Managed by dermatology.  Patient prescribed 3 different steroid creams with instructions on how to use them which areas to apply. -- nystatin powder for groin area.   #Supportive Care -- port not required but can be placed if requested.  -- zofran 8mg  q8H PRN and compazine 10mg  PO q6H for nausea -- acyclovir 400mg  PO BID for VCZ prophylaxis -- no pain medication required at this time.  -- discussed role for zometa therapy in the setting her lytic lesions. Explained zometa is given q 12 weeks and requires a dental clearance.  After discussing this with the patient further today she knows she would like to hold on this for the time being as she is afraid of the side effects.  Strongly emphasized that she should reconsider as this will be helpful with her lytic bone lesions.  No orders of the defined types were placed in this encounter.  All questions were answered. The patient knows to call the clinic with any problems, questions or concerns.  I have spent a total of 30 minutes minutes of face-to-face and  non-face-to-face time, preparing to see the patient,  performing a medically appropriate examination, counseling and educating the patient, ordering medications/tests/procedures,documenting clinical information in the electronic health record, and care coordination.   Ulysees Barns, MD Department of Hematology/Oncology [facility] at [facility] Phone: [phone] Pager: [phone] Email: [email or web address]

## 2023-10-23 NOTE — Patient Instructions (Signed)
 [facility] - A DEPT OF [facility]  Discharge Instructions: Thank you for choosing [facility] to provide your oncology and hematology care.   If you have a lab appointment with the Cancer Center, please go directly to the Cancer Center and check in at the registration area.   Wear comfortable clothing and clothing appropriate for easy access to any Portacath or PICC line.   We strive to give you quality time with your provider. You may need to reschedule your appointment if you arrive late (15 or more minutes).  Arriving late affects you and other patients whose appointments are after yours.  Also, if you miss three or more appointments without notifying the office, you may be dismissed from the clinic at the provider's discretion.      For prescription refill requests, have your pharmacy contact our office and allow 72 hours for refills to be completed.    Today you received the following chemotherapy and/or immunotherapy agent: Carfilzomib (Kyprolis)      To help prevent nausea and vomiting after your treatment, we encourage you to take your nausea medication as directed.  BELOW ARE SYMPTOMS THAT SHOULD BE REPORTED IMMEDIATELY: *FEVER GREATER THAN 100.4 F (38 C) OR HIGHER *CHILLS OR SWEATING *NAUSEA AND VOMITING THAT IS NOT CONTROLLED WITH YOUR NAUSEA MEDICATION *UNUSUAL SHORTNESS OF BREATH *UNUSUAL BRUISING OR BLEEDING *URINARY PROBLEMS (pain or burning when urinating, or frequent urination) *BOWEL PROBLEMS (unusual diarrhea, constipation, pain near the anus) TENDERNESS IN MOUTH AND THROAT WITH OR WITHOUT PRESENCE OF ULCERS (sore throat, sores in mouth, or a toothache) UNUSUAL RASH, SWELLING OR PAIN  UNUSUAL VAGINAL DISCHARGE OR ITCHING   Items with * indicate a potential emergency and should be followed up as soon as possible or go to the Emergency Department if any problems should occur.  Please show the CHEMOTHERAPY ALERT CARD or  IMMUNOTHERAPY ALERT CARD at check-in to the Emergency Department and triage nurse.  Should you have questions after your visit or need to cancel or reschedule your appointment, please contact [facility] - [facility]  Dept: [phone]  and follow the prompts.  Office hours are 8:00 a.m. to 4:30 p.m. Monday - Friday. Please note that voicemails left after 4:00 p.m. may not be returned until the following business day.  We are closed weekends and major holidays. You have access to a nurse at all times for urgent questions. Please call the main number to the clinic Dept: [phone] and follow the prompts.   For any non-urgent questions, you may also contact your provider using MyChart. We now offer e-Visits for anyone 81 and older to request care online for non-urgent symptoms. For details visit [email or web address].   Also download the MyChart app! Go to the app store, search "MyChart", open the app, select [place], and log in with your MyChart username and password.

## 2023-10-25 ENCOUNTER — Encounter: Payer: Self-pay | Admitting: Hematology and Oncology

## 2023-11-05 ENCOUNTER — Encounter: Payer: Self-pay | Admitting: Hematology and Oncology

## 2023-11-05 ENCOUNTER — Inpatient Hospital Stay: Admitting: Licensed Clinical Social Worker

## 2023-11-05 ENCOUNTER — Inpatient Hospital Stay: Admitting: Hematology and Oncology

## 2023-11-05 ENCOUNTER — Inpatient Hospital Stay

## 2023-11-05 VITALS — BP 162/84 | HR 62 | Temp 98.4°F | Resp 13 | Wt 125.4 lb

## 2023-11-05 DIAGNOSIS — C9000 Multiple myeloma not having achieved remission: Secondary | ICD-10-CM

## 2023-11-05 DIAGNOSIS — Z79899 Other long term (current) drug therapy: Secondary | ICD-10-CM | POA: Diagnosis not present

## 2023-11-05 DIAGNOSIS — Z5112 Encounter for antineoplastic immunotherapy: Secondary | ICD-10-CM | POA: Diagnosis not present

## 2023-11-05 LAB — CMP (CANCER CENTER ONLY)
ALT: 9 U/L (ref 0–44)
AST: 15 U/L (ref 15–41)
Albumin: 3.9 g/dL (ref 3.5–5.0)
Alkaline Phosphatase: 47 U/L (ref 38–126)
Anion gap: 7 (ref 5–15)
BUN: 11 mg/dL (ref 8–23)
CO2: 26 mmol/L (ref 22–32)
Calcium: 8.8 mg/dL — ABNORMAL LOW (ref 8.9–10.3)
Chloride: 105 mmol/L (ref 98–111)
Creatinine: 0.68 mg/dL (ref 0.44–1.00)
GFR, Estimated: >60 mL/min (ref >60)
Glucose, Bld: 102 mg/dL — ABNORMAL HIGH (ref 70–99)
Potassium: 4 mmol/L (ref 3.5–5.1)
Sodium: 138 mmol/L (ref 135–145)
Total Bilirubin: 0.4 mg/dL (ref 0.0–1.2)
Total Protein: 6 g/dL — ABNORMAL LOW (ref 6.5–8.1)

## 2023-11-05 LAB — CBC WITH DIFFERENTIAL (CANCER CENTER ONLY)
Abs Immature Granulocytes: 0.04 10*3/uL (ref 0.00–0.07)
Basophils Absolute: 0 10*3/uL (ref 0.0–0.1)
Basophils Relative: 0 %
Eosinophils Absolute: 0.2 10*3/uL (ref 0.0–0.5)
Eosinophils Relative: 2 %
HCT: 38.8 % (ref 36.0–46.0)
Hemoglobin: 13 g/dL (ref 12.0–15.0)
Immature Granulocytes: 0 %
Lymphocytes Relative: 13 %
Lymphs Abs: 1.3 10*3/uL (ref 0.7–4.0)
MCH: 32.2 pg (ref 26.0–34.0)
MCHC: 33.5 g/dL (ref 30.0–36.0)
MCV: 96 fL (ref 80.0–100.0)
Monocytes Absolute: 0.9 10*3/uL (ref 0.1–1.0)
Monocytes Relative: 8 %
Neutro Abs: 8.2 10*3/uL — ABNORMAL HIGH (ref 1.7–7.7)
Neutrophils Relative %: 77 %
Platelet Count: 453 10*3/uL — ABNORMAL HIGH (ref 150–400)
RBC: 4.04 MIL/uL (ref 3.87–5.11)
RDW: 13.2 % (ref 11.5–15.5)
WBC Count: 10.6 10*3/uL — ABNORMAL HIGH (ref 4.0–10.5)
nRBC: 0 % (ref 0.0–0.2)

## 2023-11-05 MED ORDER — SODIUM CHLORIDE 0.9 % IV SOLN: Freq: Once | INTRAVENOUS | Status: AC

## 2023-11-05 MED ORDER — CARFILZOMIB CHEMO INJECTION 60 MG
70.0000 mg/m2 | Freq: Once | INTRAVENOUS | Status: AC
  Administered 2023-11-05: 120 mg via INTRAVENOUS
  Filled 2023-11-05: qty 60

## 2023-11-05 MED ORDER — DEXAMETHASONE SODIUM PHOSPHATE 100 MG/10ML IJ SOLN
40.0000 mg | Freq: Once | INTRAMUSCULAR | Status: AC
  Administered 2023-11-05: 40 mg via INTRAVENOUS
  Filled 2023-11-05: qty 4

## 2023-11-05 NOTE — Progress Notes (Signed)
 [facility] CSW Progress Note  Visual merchandiser met with patient in infusion to answer questions regarding a grant she was awarded to cover medication.  Pt received a letter stating the grant was no longer processed by [employer], but would be handled by [employer].  Covering CSW provided pt w/ a direct telephone number for [employer] should there be any concerns regarding the grant as well as contact information for assigned CSW.    Rachel Moulds, LCSW Clinical Social Worker [facility]

## 2023-11-05 NOTE — Progress Notes (Signed)
 [facility] [phone]   [phone]  PROGRESS NOTE  Patient Care Team: Farris Has, MD as PCP - General (Family Medicine)  CHIEF COMPLAINTS/PURPOSE OF CONSULTATION:  IgG Lambda Multiple Myeloma  ONCOLOGIC HISTORY: 07/27/2018-09/2018: Received weekly velcade plus dexamethasone. Therapy changed due to poor response.  09/2018: Started Revlimid 25 mg for 21 days on, 7 days off of a 28 day cycle with dexamethasone. Therapy was held after 2 cycles because of severe dermatological toxicity 12/2018: Pomalyst 1 mg daily for 21 days.  She completed 4 cycles of therapy and therapy was held due to occurrence of dermatological toxicity.  07/2019: Started Ninlaro 4 mg weekly with dexamethasone 20 mg weekly for 3 weeks on and 1 week off. Daratumumab was addd in April 2021. Clearnce Sorrel was discontinued in November 2021. 05/18/2020: Started monthly daratumumab and transitioned to q 3 month schedule in April 2022.  11/06/2022: Darzalex discontinued due to progression of disease. M protein 0.9 with worsening lytic lesions in the skeleton.  11/20/2022: Cycle 1 Day 1 of Kyprolis/Dex therapy.  12/19/2022: Cycle 2 Day 1 of Kyprolis/Dex therapy 01/15/2023: Cycle 3 Day 1 of Kyprolis/Dex therapy 02/19/2023: Cycle 4 Day 1 of Kyprolis/Dex therapy 03/18/2023: Cycle 5 Day 1 of Kyprolis/Dex therapy 04/25/2023: Cycle 6 Day 1 of Kyprolis/Dex therapy 05/28/2023: Cycle 7 Day 1 of Kyprolis/Dex therapy 06/11/2023: Cycle 8 Day 1 of Kyprolis/Dex therapy 07/08/2023 Cycle 9 Day 1 of Kyprolis/Dex therapy 09/11/2023 Cycle 10 Day 1 of Kyprolis/Dex therapy 10/09/2023: Cycle 11 Day 1 of Kyprolis/Dex therapy  HISTORY OF PRESENTING ILLNESS:  Nicole Keith 83 y.o. female returns for a follow up for IgG lambda multiple myeloma. She was last seen on 10/23/2023. She presents today for Cycle 12, Day 1 of Kyprolis/Dex.   On exam today, Nicole Keith reports she has been unfortunately having some diarrhea.  She  reports it tends to be loose like "mashed potatoes with too much milk".  She reports that she is taking Pepto-Bismol which has been slowing things down, but does turn stools dark.  She reports was not dark before taking Pepto-Bismol.  She reports that she tends to have about 2 loose stools per day, once in the morning and once in the evening.  With Pepto-Bismol is down to 1/day.  She reports she feels that he is under good control.  She notes that she continues to eat well with a good strong appetite.  She notes her sleep has been "off".  Muscular she would like.  She notes that she has not had any other major changes in her health since her last discussion.  She continues to have the rash is under good control with the steroid creams prescribed by dermatology.  She reports is itching but not painful.  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  She notes that she does her best to try to walk at least 1 mile per day.  Overall she is at her baseline and willing and able to proceed with treatment today.  She denies fevers, chills, sweats, shortness of breath, chest pain or cough. She has no other complaints. Rest of the 10 point ROS is below.  MEDICAL HISTORY:  Past Medical History:  Diagnosis Date   Allergic rhinitis    Arthritis    Colitis, collagenous    Diverticular disease    severe sig tics/fixation 2012   Family hx of colon cancer    Fracture of femoral neck, right (HCC) 01/09/2013   GERD (gastroesophageal  reflux disease)    exacerbation of reflux-like symptoms 04/2011   Hiatal hernia    Hyperlipidemia    muliplt myelom 2020   Osteoarthritis of right hip 01/10/2013    SURGICAL HISTORY: Past Surgical History:  Procedure Laterality Date   CHOLECYSTECTOMY     EYE SURGERY  feb 2016   cataract right eye   LEG SURGERY Left    TONSILLECTOMY     TOTAL HIP ARTHROPLASTY Right 01/10/2013   Procedure: TOTAL HIP ARTHROPLASTY;  Surgeon: Eulas Post, MD;  Location: [facility] OR;   Service: Orthopedics;  Laterality: Right;    SOCIAL HISTORY: Social History   Socioeconomic History   Marital status: Married    Spouse name: Not on file   Number of children: 0   Years of education: Not on file   Highest education level: Some college, no degree  Occupational History   Occupation: retired  Tobacco Use   Smoking status: Former    Current packs/day: 0.00    Types: Cigarettes    Quit date: 01/10/1972    Years since quitting: 51.8   Smokeless tobacco: Never   Tobacco comments:    quit 1973  Vaping Use   Vaping status: Never Used  Substance and Sexual Activity   Alcohol use: No   Drug use: No   Sexual activity: Not on file  Other Topics Concern   Not on file  Social History Narrative   Married, no children. Drinks 2 cups decaf coffee a day. Walks daily. Is active, plays golf, she and her husband go hiking frequently. 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   Social Drivers of Corporate investment banker Strain: Not on file  Food Insecurity: No Food Insecurity (01/17/2023)   Hunger Vital Sign    Worried About Running Out of Food in the Last Year: Never true    Ran Out of Food in the Last Year: Never true  Transportation Needs: No Transportation Needs (01/17/2023)   PRAPARE - Administrator, Civil Service (Medical): No    Lack of Transportation (Non-Medical): No  Physical Activity: Not on file  Stress: Not on file  Social Connections: Not on file  Intimate Partner Violence: Not At Risk (01/17/2023)   Humiliation, Afraid, Rape, and Kick questionnaire    Fear of Current or Ex-Partner: No    Emotionally Abused: No    Physically Abused: No    Sexually Abused: No    FAMILY HISTORY: Family History  Problem Relation Age of Onset   Congestive Heart Failure Mother    Arthritis-Osteo Mother    Colon cancer Mother    Heart attack Father    Rheum arthritis Father    Colon cancer Maternal Aunt    Colon cancer Maternal Aunt     Hypothyroidism Sister     ALLERGIES:  is allergic to kyprolis [carfilzomib], pomalyst [pomalidomide], revlimid [lenalidomide], betadine [povidone iodine], flagyl [metronidazole], fosamax [alendronate sodium], bactrim [sulfamethoxazole-trimethoprim], clindamycin/lincomycin, doxepin hcl, hydrocodone-acetaminophen, hydroxyzine, hydroxyzine hcl, tramadol hcl, valium [diazepam], actonel [risedronate sodium], boniva [ibandronate], doxycycline, and penicillins.  MEDICATIONS:  Current Outpatient Medications  Medication Sig Dispense Refill   acetaminophen (TYLENOL) 500 MG tablet Take 1,000 mg by mouth See admin instructions. Take 1,000 mg by mouth after breakfast, at 3:30 PM, and at 8:30 PM     acyclovir (ZOVIRAX) 400 MG tablet Take 1 tablet (400 mg total) by mouth 2 (two) times daily. (Patient not taking: Reported on 01/16/2023) 60 tablet 3   albuterol (VENTOLIN  HFA) 108 (90 Base) MCG/ACT inhaler SMARTSIG:1 Puff(s) By Mouth Every 4-6 Hours PRN     augmented betamethasone dipropionate (DIPROLENE-AF) 0.05 % cream SMARTSIG:sparingly Topical Twice Daily PRN     famotidine (PEPCID) 20 MG tablet Take 1 tablet (20 mg total) by mouth daily. 30 tablet 0   famotidine-calcium carbonate-magnesium hydroxide (PEPCID COMPLETE) 10-800-165 MG chewable tablet Chew 1 tablet by mouth daily as needed (for heartburn or indigestion).      guaiFENesin (MUCINEX) 600 MG 12 hr tablet Take 1 tablet (600 mg total) by mouth 2 (two) times daily. 40 tablet 0   hydrocortisone 2.5 % cream Apply topically 2 (two) times daily.     loperamide (IMODIUM) 2 MG capsule Take 1 capsule (2 mg total) by mouth 3 (three) times daily between meals as needed for diarrhea or loose stools. 30 capsule 1   triamcinolone cream (KENALOG) 0.1 % Apply 1 Application topically 2 (two) times daily. (Patient taking differently: Apply 1 Application topically 2 (two) times daily as needed (for allergic flares- affected areas).) 453.6 g 2   VASELINE PURE ULTRA WHITE  ointment Apply 1 application  topically See admin instructions. Apply to the genitalia in the morning and at bedtime     No current facility-administered medications for this visit.    REVIEW OF SYSTEMS:   Constitutional: ( - ) fevers, ( - )  chills , ( - ) night sweats Eyes: ( - ) blurriness of vision, ( - ) double vision, ( - ) watery eyes Ears, nose, mouth, throat, and face: ( - ) mucositis, ( - ) sore throat Respiratory: ( - ) cough, ( - ) dyspnea, ( - ) wheezes Cardiovascular: ( - ) palpitation, ( - ) chest discomfort, ( - ) lower extremity swelling Gastrointestinal:  ( - ) nausea, ( - ) heartburn, ( - ) change in bowel habits Skin: ( +) abnormal skin rashes Lymphatics: ( - ) new lymphadenopathy, ( - ) easy bruising Neurological: ( - ) numbness, ( - ) tingling, ( - ) new weaknesses Behavioral/Psych: ( - ) mood change, ( - ) new changes  All other systems were reviewed with the patient and are negative.  PHYSICAL EXAMINATION: ECOG PERFORMANCE STATUS: 1 - Symptomatic but completely ambulatory  Vitals:   11/05/23 1015  BP: (!) 162/84  Pulse: 62  Resp: 13  Temp: 98.4 F (36.9 C)  SpO2: 98%       Filed Weights   11/05/23 1015  Weight: 125 lb 6.4 oz (56.9 kg)        GENERAL: well appearing female in NAD  SKIN: skin color, texture, turgor are normal. Diffuse, flat erythematous rash.  EYES: conjunctiva are pink and non-injected, sclera clear LYMPH:  no palpable lymphadenopathy in the cervical or supraclavicular lymph nodes.  LUNGS: clear to auscultation and percussion with normal breathing effort HEART: regular rate & rhythm and no murmurs and no lower extremity edema Musculoskeletal: no cyanosis of digits and no clubbing  PSYCH: alert & oriented x 3, fluent speech NEURO: no focal motor/sensory deficits  LABORATORY DATA:  I have reviewed the data as listed    Latest Ref Rng & Units 11/05/2023    9:36 AM 10/23/2023    8:45 AM 10/09/2023    1:32 PM  CBC  WBC 4.0 -  10.5 K/uL 10.6  10.6  10.1   Hemoglobin 12.0 - 15.0 g/dL 16.1  09.6  04.5   Hematocrit 36.0 - 46.0 % 38.8  40.5  41.1  Platelets 150 - 400 K/uL 453  451  460        Latest Ref Rng & Units 11/05/2023    9:36 AM 10/23/2023    8:45 AM 10/09/2023    1:32 PM  CMP  Glucose 70 - 99 mg/dL 409  811  86   BUN 8 - 23 mg/dL 11  11  10    Creatinine 0.44 - 1.00 mg/dL 9.14  7.82  9.56   Sodium 135 - 145 mmol/L 138  140  137   Potassium 3.5 - 5.1 mmol/L 4.0  4.3  4.4   Chloride 98 - 111 mmol/L 105  105  103   CO2 22 - 32 mmol/L 26  28  29    Calcium 8.9 - 10.3 mg/dL 8.8  8.7  9.3   Total Protein 6.5 - 8.1 g/dL 6.0  6.1  6.3   Total Bilirubin 0.0 - 1.2 mg/dL 0.4  0.7  0.6   Alkaline Phos 38 - 126 U/L 47  53  54   AST 15 - 41 U/L 15  18  18    ALT 0 - 44 U/L 9  16  15      ASSESSMENT & PLAN Nicole Keith is a 83 y.o.female who presents to the clinic for follow up for multiple myeloma.  #IgG Lambda Multiple Myeloma: --Initially presented in April 2016 with M spike of 3.2 g/dL and IgG level of 2130. Underwent bone marrow biopsy from 11/20/2014 show 29% plasma cells. 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  --In December 2019 with rising M spike of 5.9 g/dL and worsening anemia, with Hgb 8.7, she met criteria for transformation of active multiple myeloma.  --From 07/27/2018-09/2018, received weekly velcade plus dexamethasone. Therapy changed due to poor response.  --In 09/2018, started Revlimid 25 mg for 21 days on, 7 days off of a 28 day cycle with dexamethasone. Therapy was held after 2 cycles because of severe dermatological toxicity --In 12/2018, switched to Pomalyst 1 mg daily for 21 days.  She completed 4 cycles of therapy and therapy was held due to occurrence of dermatological toxicity.  --In 07/2019, switch to Ninlaro 4 mg weekly with dexamethasone 20 mg weekly for 3 weeks on and 1 week off. Daratumumab was addd in April 2021. Clearnce Sorrel was discontinued in November 2021. --On  05/18/2020, started monthly daratumumab and transitioned to q 3 month schedule in April 2022.  --On 11/20/2022, switch to Kyprolis/Dex due to rising M-protein and bone survey that showed worsening lytic lesions. --Switched to biweekly infusion of Kyprolis/Dex per patient request  as she wants to prioritize her quality of life PLAN:   --Due for cycle 12, Day 1 today. --Labs today show white blood cell count 10.6, hemoglobin 13.0, MCV 96, platelets 453. --Most recent myeloma labs show M protein to 0.3 with normal serum free light chains. --RTC in 2 weeks for continuation of Kyprolis/Dex with continued biweekly treatments.   #Diffuse erythematous rash-stable --Managed by dermatology.  Patient prescribed 3 different steroid creams with instructions on how to use them which areas to apply. -- nystatin powder for groin area.   #Supportive Care -- port not required but can be placed if requested.  -- zofran 8mg  q8H PRN and compazine 10mg  PO q6H for nausea -- acyclovir 400mg  PO BID for VCZ prophylaxis -- no pain medication required at this time.  -- discussed role for zometa therapy in the setting her lytic lesions. Explained zometa is given q 12 weeks and requires a dental  clearance.  After discussing this with the patient further today she knows she would like to hold on this for the time being as she is afraid of the side effects.  Strongly emphasized that she should reconsider as this will be helpful with her lytic bone lesions.  No orders of the defined types were placed in this encounter.  All questions were answered. The patient knows to call the clinic with any problems, questions or concerns.  I have spent a total of 30 minutes minutes of face-to-face and non-face-to-face time, preparing to see the patient,  performing a medically appropriate examination, counseling and educating the patient, ordering medications/tests/procedures,documenting clinical information in the electronic health  record, and care coordination.   Ulysees Barns, MD Department of Hematology/Oncology [facility] at [facility] Phone: [phone] Pager: [phone] Email: [email or web address]

## 2023-11-05 NOTE — Patient Instructions (Signed)
 [facility] - A DEPT OF [facility]  Discharge Instructions: Thank you for choosing [facility] to provide your oncology and hematology care.   If you have a lab appointment with the Cancer Center, please go directly to the Cancer Center and check in at the registration area.   Wear comfortable clothing and clothing appropriate for easy access to any Portacath or PICC line.   We strive to give you quality time with your provider. You may need to reschedule your appointment if you arrive late (15 or more minutes).  Arriving late affects you and other patients whose appointments are after yours.  Also, if you miss three or more appointments without notifying the office, you may be dismissed from the clinic at the provider's discretion.      For prescription refill requests, have your pharmacy contact our office and allow 72 hours for refills to be completed.    Today you received the following chemotherapy and/or immunotherapy agents kyprolis      To help prevent nausea and vomiting after your treatment, we encourage you to take your nausea medication as directed.  BELOW ARE SYMPTOMS THAT SHOULD BE REPORTED IMMEDIATELY: *FEVER GREATER THAN 100.4 F (38 C) OR HIGHER *CHILLS OR SWEATING *NAUSEA AND VOMITING THAT IS NOT CONTROLLED WITH YOUR NAUSEA MEDICATION *UNUSUAL SHORTNESS OF BREATH *UNUSUAL BRUISING OR BLEEDING *URINARY PROBLEMS (pain or burning when urinating, or frequent urination) *BOWEL PROBLEMS (unusual diarrhea, constipation, pain near the anus) TENDERNESS IN MOUTH AND THROAT WITH OR WITHOUT PRESENCE OF ULCERS (sore throat, sores in mouth, or a toothache) UNUSUAL RASH, SWELLING OR PAIN  UNUSUAL VAGINAL DISCHARGE OR ITCHING   Items with * indicate a potential emergency and should be followed up as soon as possible or go to the Emergency Department if any problems should occur.  Please show the CHEMOTHERAPY ALERT CARD or IMMUNOTHERAPY  ALERT CARD at check-in to the Emergency Department and triage nurse.  Should you have questions after your visit or need to cancel or reschedule your appointment, please contact [facility] - A DEPT OF [facility]  Dept: [phone]  and follow the prompts.  Office hours are 8:00 a.m. to 4:30 p.m. Monday - Friday. Please note that voicemails left after 4:00 p.m. may not be returned until the following business day.  We are closed weekends and major holidays. You have access to a nurse at all times for urgent questions. Please call the main number to the clinic Dept: [phone] and follow the prompts.   For any non-urgent questions, you may also contact your provider using MyChart. We now offer e-Visits for anyone 80 and older to request care online for non-urgent symptoms. For details visit [email or web address].   Also download the MyChart app! Go to the app store, search "MyChart", open the app, select [facility], and log in with your MyChart username and password.

## 2023-11-06 ENCOUNTER — Ambulatory Visit: Payer: Medicare Other | Admitting: Physician Assistant

## 2023-11-06 ENCOUNTER — Other Ambulatory Visit: Payer: Medicare Other

## 2023-11-06 ENCOUNTER — Ambulatory Visit: Payer: Medicare Other

## 2023-11-06 LAB — KAPPA/LAMBDA LIGHT CHAINS
Kappa free light chain: 1.4 mg/L — ABNORMAL LOW (ref 3.3–19.4)
Kappa, lambda light chain ratio: 0.11 — ABNORMAL LOW (ref 0.26–1.65)
Lambda free light chains: 13 mg/L (ref 5.7–26.3)

## 2023-11-09 LAB — MULTIPLE MYELOMA PANEL, SERUM
Albumin SerPl Elph-Mcnc: 3.2 g/dL (ref 2.9–4.4)
Albumin/Glob SerPl: 1.3 (ref 0.7–1.7)
Alpha 1: 0.3 g/dL (ref 0.0–0.4)
Alpha2 Glob SerPl Elph-Mcnc: 0.8 g/dL (ref 0.4–1.0)
B-Globulin SerPl Elph-Mcnc: 0.8 g/dL (ref 0.7–1.3)
Gamma Glob SerPl Elph-Mcnc: 0.6 g/dL (ref 0.4–1.8)
Globulin, Total: 2.5 g/dL (ref 2.2–3.9)
IgA: 9 mg/dL — ABNORMAL LOW (ref 64–422)
IgG (Immunoglobin G), Serum: 592 mg/dL (ref 586–1602)
IgM (Immunoglobulin M), Srm: 7 mg/dL — ABNORMAL LOW (ref 26–217)
M Protein SerPl Elph-Mcnc: 0.4 g/dL — ABNORMAL HIGH
Total Protein ELP: 5.7 g/dL — ABNORMAL LOW (ref 6.0–8.5)

## 2023-11-11 ENCOUNTER — Other Ambulatory Visit: Payer: Self-pay

## 2023-11-19 ENCOUNTER — Inpatient Hospital Stay (HOSPITAL_BASED_OUTPATIENT_CLINIC_OR_DEPARTMENT_OTHER): Admitting: Hematology and Oncology

## 2023-11-19 ENCOUNTER — Inpatient Hospital Stay: Attending: Physician Assistant

## 2023-11-19 ENCOUNTER — Inpatient Hospital Stay

## 2023-11-19 VITALS — BP 139/80 | HR 68 | Temp 97.3°F | Resp 18 | Wt 127.5 lb

## 2023-11-19 VITALS — BP 150/60 | HR 68 | Temp 98.1°F | Resp 16

## 2023-11-19 DIAGNOSIS — C9000 Multiple myeloma not having achieved remission: Secondary | ICD-10-CM

## 2023-11-19 DIAGNOSIS — Z5112 Encounter for antineoplastic immunotherapy: Secondary | ICD-10-CM | POA: Insufficient documentation

## 2023-11-19 DIAGNOSIS — R21 Rash and other nonspecific skin eruption: Secondary | ICD-10-CM | POA: Diagnosis not present

## 2023-11-19 DIAGNOSIS — Z79899 Other long term (current) drug therapy: Secondary | ICD-10-CM | POA: Insufficient documentation

## 2023-11-19 LAB — CMP (CANCER CENTER ONLY)
ALT: 18 U/L (ref 0–44)
AST: 22 U/L (ref 15–41)
Albumin: 4.2 g/dL (ref 3.5–5.0)
Alkaline Phosphatase: 54 U/L (ref 38–126)
Anion gap: 9 (ref 5–15)
BUN: 10 mg/dL (ref 8–23)
CO2: 27 mmol/L (ref 22–32)
Calcium: 9.3 mg/dL (ref 8.9–10.3)
Chloride: 103 mmol/L (ref 98–111)
Creatinine: 0.68 mg/dL (ref 0.44–1.00)
GFR, Estimated: >60 mL/min (ref >60)
Glucose, Bld: 116 mg/dL — ABNORMAL HIGH (ref 70–99)
Potassium: 4.1 mmol/L (ref 3.5–5.1)
Sodium: 139 mmol/L (ref 135–145)
Total Bilirubin: 0.6 mg/dL (ref 0.0–1.2)
Total Protein: 6.4 g/dL — ABNORMAL LOW (ref 6.5–8.1)

## 2023-11-19 LAB — CBC WITH DIFFERENTIAL (CANCER CENTER ONLY)
Abs Immature Granulocytes: 0.07 10*3/uL (ref 0.00–0.07)
Basophils Absolute: 0.1 10*3/uL (ref 0.0–0.1)
Basophils Relative: 1 %
Eosinophils Absolute: 0.2 10*3/uL (ref 0.0–0.5)
Eosinophils Relative: 2 %
HCT: 41.7 % (ref 36.0–46.0)
Hemoglobin: 13.8 g/dL (ref 12.0–15.0)
Immature Granulocytes: 1 %
Lymphocytes Relative: 13 %
Lymphs Abs: 1.3 10*3/uL (ref 0.7–4.0)
MCH: 31.9 pg (ref 26.0–34.0)
MCHC: 33.1 g/dL (ref 30.0–36.0)
MCV: 96.3 fL (ref 80.0–100.0)
Monocytes Absolute: 0.7 10*3/uL (ref 0.1–1.0)
Monocytes Relative: 7 %
Neutro Abs: 7.6 10*3/uL (ref 1.7–7.7)
Neutrophils Relative %: 76 %
Platelet Count: 507 10*3/uL — ABNORMAL HIGH (ref 150–400)
RBC: 4.33 MIL/uL (ref 3.87–5.11)
RDW: 13.2 % (ref 11.5–15.5)
WBC Count: 9.9 10*3/uL (ref 4.0–10.5)
nRBC: 0 % (ref 0.0–0.2)

## 2023-11-19 MED ORDER — SODIUM CHLORIDE 0.9 % IV SOLN: Freq: Once | INTRAVENOUS | Status: AC

## 2023-11-19 MED ORDER — SODIUM CHLORIDE 0.9 % IV SOLN
40.0000 mg | Freq: Once | INTRAVENOUS | Status: AC
  Administered 2023-11-19: 40 mg via INTRAVENOUS
  Filled 2023-11-19: qty 4

## 2023-11-19 MED ORDER — DEXTROSE 5 % IV SOLN
70.0000 mg/m2 | Freq: Once | INTRAVENOUS | Status: AC
  Administered 2023-11-19: 120 mg via INTRAVENOUS
  Filled 2023-11-19: qty 60

## 2023-11-19 NOTE — Progress Notes (Signed)
 [facility] [phone]   [phone]  PROGRESS NOTE  Patient Care Team: Farris Has, MD as PCP - General (Family Medicine)  CHIEF COMPLAINTS/PURPOSE OF CONSULTATION:  IgG Lambda Multiple Myeloma  ONCOLOGIC HISTORY: 07/27/2018-09/2018: Received weekly velcade plus dexamethasone. Therapy changed due to poor response.  09/2018: Started Revlimid 25 mg for 21 days on, 7 days off of a 28 day cycle with dexamethasone. Therapy was held after 2 cycles because of severe dermatological toxicity 12/2018: Pomalyst 1 mg daily for 21 days.  She completed 4 cycles of therapy and therapy was held due to occurrence of dermatological toxicity.  07/2019: Started Ninlaro 4 mg weekly with dexamethasone 20 mg weekly for 3 weeks on and 1 week off. Daratumumab was addd in April 2021. Clearnce Sorrel was discontinued in November 2021. 05/18/2020: Started monthly daratumumab and transitioned to q 3 month schedule in April 2022.  11/06/2022: Darzalex discontinued due to progression of disease. M protein 0.9 with worsening lytic lesions in the skeleton.  11/20/2022: Cycle 1 Day 1 of Kyprolis/Dex therapy.  12/19/2022: Cycle 2 Day 1 of Kyprolis/Dex therapy 01/15/2023: Cycle 3 Day 1 of Kyprolis/Dex therapy 02/19/2023: Cycle 4 Day 1 of Kyprolis/Dex therapy 03/18/2023: Cycle 5 Day 1 of Kyprolis/Dex therapy 04/25/2023: Cycle 6 Day 1 of Kyprolis/Dex therapy 05/28/2023: Cycle 7 Day 1 of Kyprolis/Dex therapy 06/11/2023: Cycle 8 Day 1 of Kyprolis/Dex therapy 07/08/2023 Cycle 9 Day 1 of Kyprolis/Dex therapy 09/11/2023 Cycle 10 Day 1 of Kyprolis/Dex therapy 10/09/2023: Cycle 11 Day 1 of Kyprolis/Dex therapy  HISTORY OF PRESENTING ILLNESS:  Nicole Keith 83 y.o. female returns for a follow up for IgG lambda multiple myeloma. She was last seen on 11/05/2023. She presents today for Cycle 12, Day 15 of Kyprolis/Dex.   On exam today, Ms. Shawgo is accompanied by a friend.  She reports that she is feeling  quite well today.  She reports that she continues to have confusion and some chemo brain.  She notes that she did well with the last infusion but continues to have rash.  Responding well to her steroid creams.  She reports that she is quite tired in the afternoon and does have some difficulty sleeping because she uses the bathroom frequently at night.  She does that she has had no infectious symptoms but is having some runny nose and dry throat due to the pollen this year.  Weight is increased by 2 pounds in the interim since her last visit. Overall she is at her baseline and willing and able to proceed with treatment today.  She denies fevers, chills, sweats, shortness of breath, chest pain or cough. She has no other complaints. Rest of the 10 point ROS is below.  MEDICAL HISTORY:  Past Medical History:  Diagnosis Date   Allergic rhinitis    Arthritis    Colitis, collagenous    Diverticular disease    severe sig tics/fixation 2012   Family hx of colon cancer    Fracture of femoral neck, right (HCC) 01/09/2013   GERD (gastroesophageal reflux disease)    exacerbation of reflux-like symptoms 04/2011   Hiatal hernia    Hyperlipidemia    muliplt myelom 2020   Osteoarthritis of right hip 01/10/2013    SURGICAL HISTORY: Past Surgical History:  Procedure Laterality Date   CHOLECYSTECTOMY     EYE SURGERY  feb 2016   cataract right eye   LEG SURGERY Left    TONSILLECTOMY     TOTAL HIP ARTHROPLASTY Right 01/10/2013  Procedure: TOTAL HIP ARTHROPLASTY;  Surgeon: Eulas Post, MD;  Location: [facility] OR;  Service: Orthopedics;  Laterality: Right;    SOCIAL HISTORY: Social History   Socioeconomic History   Marital status: Married    Spouse name: Not on file   Number of children: 0   Years of education: Not on file   Highest education level: Some college, no degree  Occupational History   Occupation: retired  Tobacco Use   Smoking status: Former    Current packs/day: 0.00    Types:  Cigarettes    Quit date: 01/10/1972    Years since quitting: 51.8   Smokeless tobacco: Never   Tobacco comments:    quit 1973  Vaping Use   Vaping status: Never Used  Substance and Sexual Activity   Alcohol use: No   Drug use: No   Sexual activity: Not on file  Other Topics Concern   Not on file  Social History Narrative   Married, no children. Drinks 2 cups decaf coffee a day. Walks daily. Is active, plays golf, she and her husband go hiking frequently. 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   Social Drivers of Corporate investment banker Strain: Not on file  Food Insecurity: No Food Insecurity (01/17/2023)   Hunger Vital Sign    Worried About Running Out of Food in the Last Year: Never true    Ran Out of Food in the Last Year: Never true  Transportation Needs: No Transportation Needs (01/17/2023)   PRAPARE - Administrator, Civil Service (Medical): No    Lack of Transportation (Non-Medical): No  Physical Activity: Not on file  Stress: Not on file  Social Connections: Not on file  Intimate Partner Violence: Not At Risk (01/17/2023)   Humiliation, Afraid, Rape, and Kick questionnaire    Fear of Current or Ex-Partner: No    Emotionally Abused: No    Physically Abused: No    Sexually Abused: No    FAMILY HISTORY: Family History  Problem Relation Age of Onset   Congestive Heart Failure Mother    Arthritis-Osteo Mother    Colon cancer Mother    Heart attack Father    Rheum arthritis Father    Colon cancer Maternal Aunt    Colon cancer Maternal Aunt    Hypothyroidism Sister     ALLERGIES:  is allergic to kyprolis [carfilzomib], pomalyst [pomalidomide], revlimid [lenalidomide], betadine [povidone iodine], flagyl [metronidazole], fosamax [alendronate sodium], bactrim [sulfamethoxazole-trimethoprim], clindamycin/lincomycin, doxepin hcl, hydrocodone-acetaminophen, hydroxyzine, hydroxyzine hcl, tramadol hcl, valium [diazepam], actonel [risedronate  sodium], boniva [ibandronate], doxycycline, and penicillins.  MEDICATIONS:  Current Outpatient Medications  Medication Sig Dispense Refill   acetaminophen (TYLENOL) 500 MG tablet Take 1,000 mg by mouth See admin instructions. Take 1,000 mg by mouth after breakfast, at 3:30 PM, and at 8:30 PM     acyclovir (ZOVIRAX) 400 MG tablet Take 1 tablet (400 mg total) by mouth 2 (two) times daily. (Patient not taking: Reported on 01/16/2023) 60 tablet 3   albuterol (VENTOLIN HFA) 108 (90 Base) MCG/ACT inhaler SMARTSIG:1 Puff(s) By Mouth Every 4-6 Hours PRN     augmented betamethasone dipropionate (DIPROLENE-AF) 0.05 % cream SMARTSIG:sparingly Topical Twice Daily PRN     famotidine (PEPCID) 20 MG tablet Take 1 tablet (20 mg total) by mouth daily. 30 tablet 0   famotidine-calcium carbonate-magnesium hydroxide (PEPCID COMPLETE) 10-800-165 MG chewable tablet Chew 1 tablet by mouth daily as needed (for heartburn or indigestion).      guaiFENesin (  MUCINEX) 600 MG 12 hr tablet Take 1 tablet (600 mg total) by mouth 2 (two) times daily. 40 tablet 0   hydrocortisone 2.5 % cream Apply topically 2 (two) times daily.     loperamide (IMODIUM) 2 MG capsule Take 1 capsule (2 mg total) by mouth 3 (three) times daily between meals as needed for diarrhea or loose stools. 30 capsule 1   triamcinolone cream (KENALOG) 0.1 % Apply 1 Application topically 2 (two) times daily. (Patient taking differently: Apply 1 Application topically 2 (two) times daily as needed (for allergic flares- affected areas).) 453.6 g 2   VASELINE PURE ULTRA WHITE ointment Apply 1 application  topically See admin instructions. Apply to the genitalia in the morning and at bedtime     No current facility-administered medications for this visit.    REVIEW OF SYSTEMS:   Constitutional: ( - ) fevers, ( - )  chills , ( - ) night sweats Eyes: ( - ) blurriness of vision, ( - ) double vision, ( - ) watery eyes Ears, nose, mouth, throat, and face: ( - ) mucositis,  ( - ) sore throat Respiratory: ( - ) cough, ( - ) dyspnea, ( - ) wheezes Cardiovascular: ( - ) palpitation, ( - ) chest discomfort, ( - ) lower extremity swelling Gastrointestinal:  ( - ) nausea, ( - ) heartburn, ( - ) change in bowel habits Skin: ( +) abnormal skin rashes Lymphatics: ( - ) new lymphadenopathy, ( - ) easy bruising Neurological: ( - ) numbness, ( - ) tingling, ( - ) new weaknesses Behavioral/Psych: ( - ) mood change, ( - ) new changes  All other systems were reviewed with the patient and are negative.  PHYSICAL EXAMINATION: ECOG PERFORMANCE STATUS: 1 - Symptomatic but completely ambulatory  Vitals:   11/19/23 0918  BP: 139/80  Pulse: 68  Resp: 18  Temp: (!) 97.3 F (36.3 C)  SpO2: 97%        Filed Weights   11/19/23 0918  Weight: 127 lb 8 oz (57.8 kg)         GENERAL: well appearing female in NAD  SKIN: skin color, texture, turgor are normal. Diffuse, flat erythematous rash.  EYES: conjunctiva are pink and non-injected, sclera clear LYMPH:  no palpable lymphadenopathy in the cervical or supraclavicular lymph nodes.  LUNGS: clear to auscultation and percussion with normal breathing effort HEART: regular rate & Nicole and no murmurs and no lower extremity edema Musculoskeletal: no cyanosis of digits and no clubbing  PSYCH: alert & oriented x 3, fluent speech NEURO: no focal motor/sensory deficits  LABORATORY DATA:  I have reviewed the data as listed    Latest Ref Rng & Units 11/19/2023    8:48 AM 11/05/2023    9:36 AM 10/23/2023    8:45 AM  CBC  WBC 4.0 - 10.5 K/uL 9.9  10.6  10.6   Hemoglobin 12.0 - 15.0 g/dL 16.1  09.6  04.5   Hematocrit 36.0 - 46.0 % 41.7  38.8  40.5   Platelets 150 - 400 K/uL 507  453  451        Latest Ref Rng & Units 11/19/2023    8:48 AM 11/05/2023    9:36 AM 10/23/2023    8:45 AM  CMP  Glucose 70 - 99 mg/dL 409  811  914   BUN 8 - 23 mg/dL 10  11  11    Creatinine 0.44 - 1.00 mg/dL 7.82  9.56  2.13  Sodium 135 -  145 mmol/L 139  138  140   Potassium 3.5 - 5.1 mmol/L 4.1  4.0  4.3   Chloride 98 - 111 mmol/L 103  105  105   CO2 22 - 32 mmol/L 27  26  28    Calcium 8.9 - 10.3 mg/dL 9.3  8.8  8.7   Total Protein 6.5 - 8.1 g/dL 6.4  6.0  6.1   Total Bilirubin 0.0 - 1.2 mg/dL 0.6  0.4  0.7   Alkaline Phos 38 - 126 U/L 54  47  53   AST 15 - 41 U/L 22  15  18    ALT 0 - 44 U/L 18  9  16      ASSESSMENT & PLAN NALEA SALCE is a 83 y.o.female who presents to the clinic for follow up for multiple myeloma.  #IgG Lambda Multiple Myeloma: --Initially presented in April 2016 with M spike of 3.2 g/dL and IgG level of 4098. Underwent bone marrow biopsy from 11/20/2014 show 29% plasma cells. 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  --In December 2019 with rising M spike of 5.9 g/dL and worsening anemia, with Hgb 8.7, she met criteria for transformation of active multiple myeloma.  --From 07/27/2018-09/2018, received weekly velcade plus dexamethasone. Therapy changed due to poor response.  --In 09/2018, started Revlimid 25 mg for 21 days on, 7 days off of a 28 day cycle with dexamethasone. Therapy was held after 2 cycles because of severe dermatological toxicity --In 12/2018, switched to Pomalyst 1 mg daily for 21 days.  She completed 4 cycles of therapy and therapy was held due to occurrence of dermatological toxicity.  --In 07/2019, switch to Ninlaro 4 mg weekly with dexamethasone 20 mg weekly for 3 weeks on and 1 week off. Daratumumab was addd in April 2021. Clearnce Sorrel was discontinued in November 2021. --On 05/18/2020, started monthly daratumumab and transitioned to q 3 month schedule in April 2022.  --On 11/20/2022, switch to Kyprolis/Dex due to rising M-protein and bone survey that showed worsening lytic lesions. --Switched to biweekly infusion of Kyprolis/Dex per patient request  as she wants to prioritize her quality of life PLAN:   --Due for cycle 12, Day 1 today. --Labs today show white blood  cell count 9.9, hemoglobin 13.8, MCV 96.3, platelets 507 --Most recent myeloma labs show M protein to 0.4 with normal serum free light chains. --RTC in 2 weeks for continuation of Kyprolis/Dex with continued biweekly treatments.   #Diffuse erythematous rash-stable --Managed by dermatology.  Patient prescribed 3 different steroid creams with instructions on how to use them which areas to apply. -- nystatin powder for groin area.   #Supportive Care -- port not required but can be placed if requested.  -- zofran 8mg  q8H PRN and compazine 10mg  PO q6H for nausea -- acyclovir 400mg  PO BID for VCZ prophylaxis -- no pain medication required at this time.  -- discussed role for zometa therapy in the setting her lytic lesions. Explained zometa is given q 12 weeks and requires a dental clearance.  After discussing this with the patient further today she knows she would like to hold on this for the time being as she is afraid of the side effects.  Strongly emphasized that she should reconsider as this will be helpful with her lytic bone lesions.  No orders of the defined types were placed in this encounter.  All questions were answered. The patient knows to call the clinic with any problems, questions or concerns.  I have spent a total of 30 minutes minutes of face-to-face and non-face-to-face time, preparing to see the patient,  performing a medically appropriate examination, counseling and educating the patient, ordering medications/tests/procedures,documenting clinical information in the electronic health record, and care coordination.   Ulysees Barns, MD Department of Hematology/Oncology [facility] Phone: [phone] Pager: [phone] Email: [email or web address]

## 2023-11-19 NOTE — Patient Instructions (Signed)
 [facility] - A DEPT OF [facility]  Discharge Instructions: Thank you for choosing [facility] to provide your oncology and hematology care.   If you have a lab appointment with the Cancer Center, please go directly to the Cancer Center and check in at the registration area.   Wear comfortable clothing and clothing appropriate for easy access to any Portacath or PICC line.   We strive to give you quality time with your provider. You may need to reschedule your appointment if you arrive late (15 or more minutes).  Arriving late affects you and other patients whose appointments are after yours.  Also, if you miss three or more appointments without notifying the office, you may be dismissed from the clinic at the provider's discretion.      For prescription refill requests, have your pharmacy contact our office and allow 72 hours for refills to be completed.    Today you received the following chemotherapy and/or immunotherapy agent: Carfilzomib (Kyprolis)      To help prevent nausea and vomiting after your treatment, we encourage you to take your nausea medication as directed.  BELOW ARE SYMPTOMS THAT SHOULD BE REPORTED IMMEDIATELY: *FEVER GREATER THAN 100.4 F (38 C) OR HIGHER *CHILLS OR SWEATING *NAUSEA AND VOMITING THAT IS NOT CONTROLLED WITH YOUR NAUSEA MEDICATION *UNUSUAL SHORTNESS OF BREATH *UNUSUAL BRUISING OR BLEEDING *URINARY PROBLEMS (pain or burning when urinating, or frequent urination) *BOWEL PROBLEMS (unusual diarrhea, constipation, pain near the anus) TENDERNESS IN MOUTH AND THROAT WITH OR WITHOUT PRESENCE OF ULCERS (sore throat, sores in mouth, or a toothache) UNUSUAL RASH, SWELLING OR PAIN  UNUSUAL VAGINAL DISCHARGE OR ITCHING   Items with * indicate a potential emergency and should be followed up as soon as possible or go to the Emergency Department if any problems should occur.  Please show the CHEMOTHERAPY ALERT CARD or  IMMUNOTHERAPY ALERT CARD at check-in to the Emergency Department and triage nurse.  Should you have questions after your visit or need to cancel or reschedule your appointment, please contact [facility] - [facility]  Dept: [phone]  and follow the prompts.  Office hours are 8:00 a.m. to 4:30 p.m. Monday - Friday. Please note that voicemails left after 4:00 p.m. may not be returned until the following business day.  We are closed weekends and major holidays. You have access to a nurse at all times for urgent questions. Please call the main number to the clinic Dept: [phone] and follow the prompts.   For any non-urgent questions, you may also contact your provider using MyChart. We now offer e-Visits for anyone 81 and older to request care online for non-urgent symptoms. For details visit [email or web address].   Also download the MyChart app! Go to the app store, search "MyChart", open the app, select [place], and log in with your MyChart username and password.

## 2023-11-20 ENCOUNTER — Other Ambulatory Visit: Payer: Medicare Other

## 2023-11-20 ENCOUNTER — Ambulatory Visit: Payer: Medicare Other

## 2023-11-20 ENCOUNTER — Ambulatory Visit: Payer: Medicare Other | Admitting: Physician Assistant

## 2023-11-20 ENCOUNTER — Encounter: Payer: Self-pay | Admitting: Physician Assistant

## 2023-11-20 NOTE — Progress Notes (Signed)
 Returned call to patient from voicemail left.  Patient had concerns regarding [employer] and [employer] and account balance. Explained to patient [employer] is still a copay foundation for chemotherapy. Advised [employer] works directly with [employer] as far as enrollment, re-enrollment, grant balance and claims. She will call [employer] at [phone] to have her questions regarding her [employer] grant answered.   She has my card for any additional financial questions or concerns.

## 2023-12-02 ENCOUNTER — Inpatient Hospital Stay (HOSPITAL_BASED_OUTPATIENT_CLINIC_OR_DEPARTMENT_OTHER)

## 2023-12-02 ENCOUNTER — Inpatient Hospital Stay (HOSPITAL_BASED_OUTPATIENT_CLINIC_OR_DEPARTMENT_OTHER): Admitting: Physician Assistant

## 2023-12-02 ENCOUNTER — Inpatient Hospital Stay

## 2023-12-02 VITALS — BP 141/68 | HR 69 | Resp 16

## 2023-12-02 VITALS — BP 145/74 | HR 64 | Temp 97.9°F | Resp 19 | Wt 127.3 lb

## 2023-12-02 DIAGNOSIS — C9000 Multiple myeloma not having achieved remission: Secondary | ICD-10-CM

## 2023-12-02 DIAGNOSIS — Z5112 Encounter for antineoplastic immunotherapy: Secondary | ICD-10-CM | POA: Diagnosis not present

## 2023-12-02 DIAGNOSIS — Z79899 Other long term (current) drug therapy: Secondary | ICD-10-CM | POA: Diagnosis not present

## 2023-12-02 LAB — CBC WITH DIFFERENTIAL (CANCER CENTER ONLY)
Abs Immature Granulocytes: 0.04 10*3/uL (ref 0.00–0.07)
Basophils Absolute: 0.1 10*3/uL (ref 0.0–0.1)
Basophils Relative: 1 %
Eosinophils Absolute: 0.3 10*3/uL (ref 0.0–0.5)
Eosinophils Relative: 3 %
HCT: 39.9 % (ref 36.0–46.0)
Hemoglobin: 13.2 g/dL (ref 12.0–15.0)
Immature Granulocytes: 0 %
Lymphocytes Relative: 21 %
Lymphs Abs: 1.9 10*3/uL (ref 0.7–4.0)
MCH: 32.4 pg (ref 26.0–34.0)
MCHC: 33.1 g/dL (ref 30.0–36.0)
MCV: 97.8 fL (ref 80.0–100.0)
Monocytes Absolute: 1 10*3/uL (ref 0.1–1.0)
Monocytes Relative: 11 %
Neutro Abs: 5.7 10*3/uL (ref 1.7–7.7)
Neutrophils Relative %: 64 %
Platelet Count: 550 10*3/uL — ABNORMAL HIGH (ref 150–400)
RBC: 4.08 MIL/uL (ref 3.87–5.11)
RDW: 13.2 % (ref 11.5–15.5)
WBC Count: 9 10*3/uL (ref 4.0–10.5)
nRBC: 0 % (ref 0.0–0.2)

## 2023-12-02 LAB — CMP (CANCER CENTER ONLY)
ALT: 15 U/L (ref 0–44)
AST: 19 U/L (ref 15–41)
Albumin: 4.1 g/dL (ref 3.5–5.0)
Alkaline Phosphatase: 49 U/L (ref 38–126)
Anion gap: 4 — ABNORMAL LOW (ref 5–15)
BUN: 10 mg/dL (ref 8–23)
CO2: 30 mmol/L (ref 22–32)
Calcium: 9.4 mg/dL (ref 8.9–10.3)
Chloride: 104 mmol/L (ref 98–111)
Creatinine: 0.61 mg/dL (ref 0.44–1.00)
GFR, Estimated: >60 mL/min (ref >60)
Glucose, Bld: 82 mg/dL (ref 70–99)
Potassium: 4.5 mmol/L (ref 3.5–5.1)
Sodium: 138 mmol/L (ref 135–145)
Total Bilirubin: 0.6 mg/dL (ref 0.0–1.2)
Total Protein: 5.9 g/dL — ABNORMAL LOW (ref 6.5–8.1)

## 2023-12-02 MED ORDER — SODIUM CHLORIDE 0.9 % IV SOLN
Freq: Once | INTRAVENOUS | Status: AC
Start: 2023-12-02 — End: 2023-12-02

## 2023-12-02 MED ORDER — DEXTROSE 5 % IV SOLN
70.0000 mg/m2 | Freq: Once | INTRAVENOUS | Status: AC
  Administered 2023-12-02: 120 mg via INTRAVENOUS
  Filled 2023-12-02: qty 60

## 2023-12-02 MED ORDER — SODIUM CHLORIDE 0.9 % IV SOLN: Freq: Once | INTRAVENOUS | Status: AC

## 2023-12-02 MED ORDER — SODIUM CHLORIDE 0.9 % IV SOLN
40.0000 mg | Freq: Once | INTRAVENOUS | Status: AC
  Administered 2023-12-02: 40 mg via INTRAVENOUS
  Filled 2023-12-02: qty 4

## 2023-12-02 NOTE — Patient Instructions (Signed)
 [facility] - A DEPT OF [facility]  Discharge Instructions: Thank you for choosing [facility] to provide your oncology and hematology care.   If you have a lab appointment with the Cancer Center, please go directly to the Cancer Center and check in at the registration area.   Wear comfortable clothing and clothing appropriate for easy access to any Portacath or PICC line.   We strive to give you quality time with your provider. You may need to reschedule your appointment if you arrive late (15 or more minutes).  Arriving late affects you and other patients whose appointments are after yours.  Also, if you miss three or more appointments without notifying the office, you may be dismissed from the clinic at the provider's discretion.      For prescription refill requests, have your pharmacy contact our office and allow 72 hours for refills to be completed.    Today you received the following chemotherapy and/or immunotherapy agent: Carfilzomib (Kyprolis)      To help prevent nausea and vomiting after your treatment, we encourage you to take your nausea medication as directed.  BELOW ARE SYMPTOMS THAT SHOULD BE REPORTED IMMEDIATELY: *FEVER GREATER THAN 100.4 F (38 C) OR HIGHER *CHILLS OR SWEATING *NAUSEA AND VOMITING THAT IS NOT CONTROLLED WITH YOUR NAUSEA MEDICATION *UNUSUAL SHORTNESS OF BREATH *UNUSUAL BRUISING OR BLEEDING *URINARY PROBLEMS (pain or burning when urinating, or frequent urination) *BOWEL PROBLEMS (unusual diarrhea, constipation, pain near the anus) TENDERNESS IN MOUTH AND THROAT WITH OR WITHOUT PRESENCE OF ULCERS (sore throat, sores in mouth, or a toothache) UNUSUAL RASH, SWELLING OR PAIN  UNUSUAL VAGINAL DISCHARGE OR ITCHING   Items with * indicate a potential emergency and should be followed up as soon as possible or go to the Emergency Department if any problems should occur.  Please show the CHEMOTHERAPY ALERT CARD or  IMMUNOTHERAPY ALERT CARD at check-in to the Emergency Department and triage nurse.  Should you have questions after your visit or need to cancel or reschedule your appointment, please contact [facility] - [facility]  Dept: [phone]  and follow the prompts.  Office hours are 8:00 a.m. to 4:30 p.m. Monday - Friday. Please note that voicemails left after 4:00 p.m. may not be returned until the following business day.  We are closed weekends and major holidays. You have access to a nurse at all times for urgent questions. Please call the main number to the clinic Dept: [phone] and follow the prompts.   For any non-urgent questions, you may also contact your provider using MyChart. We now offer e-Visits for anyone 81 and older to request care online for non-urgent symptoms. For details visit [email or web address].   Also download the MyChart app! Go to the app store, search "MyChart", open the app, select [place], and log in with your MyChart username and password.

## 2023-12-02 NOTE — Progress Notes (Signed)
 [facility] [phone]) [phone]   [phone]  PROGRESS NOTE  Patient Care Team: Ronna Coho, MD as PCP - General (Family Medicine)  CHIEF COMPLAINTS/PURPOSE OF CONSULTATION:  IgG Lambda Multiple Myeloma  ONCOLOGIC HISTORY: 07/27/2018-09/2018: Received weekly velcade  plus dexamethasone . Therapy changed due to poor response.  09/2018: Started Revlimid  25 mg for 21 days on, 7 days off of a 28 day cycle with dexamethasone . Therapy was held after 2 cycles because of severe dermatological toxicity 12/2018: Pomalyst  1 mg daily for 21 days.  She completed 4 cycles of therapy and therapy was held due to occurrence of dermatological toxicity.  07/2019: Started Ninlaro  4 mg weekly with dexamethasone  20 mg weekly for 3 weeks on and 1 week off. Daratumumab  was addd in April 2021. Ninlaro  was discontinued in November 2021. 05/18/2020: Started monthly daratumumab  and transitioned to q 3 month schedule in April 2022.  11/06/2022: Darzalex  discontinued due to progression of disease. M protein 0.9 with worsening lytic lesions in the skeleton.  11/20/2022: Cycle 1 Day 1 of Kyprolis /Dex therapy.  12/19/2022: Cycle 2 Day 1 of Kyprolis /Dex therapy 01/15/2023: Cycle 3 Day 1 of Kyprolis /Dex therapy 02/19/2023: Cycle 4 Day 1 of Kyprolis /Dex therapy 03/18/2023: Cycle 5 Day 1 of Kyprolis /Dex therapy 04/25/2023: Cycle 6 Day 1 of Kyprolis /Dex therapy 05/28/2023: Cycle 7 Day 1 of Kyprolis /Dex therapy 06/11/2023: Cycle 8 Day 1 of Kyprolis /Dex therapy 07/08/2023 Cycle 9 Day 1 of Kyprolis /Dex therapy 09/11/2023 Cycle 10 Day 1 of Kyprolis /Dex therapy 10/09/2023: Cycle 11 Day 1 of Kyprolis /Dex therapy 11/05/2023: Cycle 12 Day 1 of Kyprolis /Dex therapy 12/02/2023: Cycle 13 Day 1 of Kyprolis /Dex therapy  HISTORY OF PRESENTING ILLNESS:  Nicole Keith 83 y.o. female returns for a follow up for IgG lambda multiple myeloma. She was last seen on 11/05/2023. She presents today for Cycle 13, Day 1 of  Kyprolis /Dex.   On exam today, Nicole Keith is unaccompanied for this visit. Her energy levels are fairly stable. She uses a cane to ambulate at home. She tries to stay active and looking forward to traveling in her camper this summer. She denies nausea, vomiting or abdominal pain. She has intermittent episodes of diarrhea every 3-4 days that improves with imodium  and peptobismal. She denies easy bruising or signs of bleeding. She has some right shoulder and neck pain if she sleeps on her right side for too long. She denies fevers, chills, sweats, shortness of breath, chest pain or cough.She has no other complaints. Rest of the 10 point ROS is below.  MEDICAL HISTORY:  Past Medical History:  Diagnosis Date   Allergic rhinitis    Arthritis    Colitis, collagenous    Diverticular disease    severe sig tics/fixation 2012   Family hx of colon cancer    Fracture of femoral neck, right (HCC) 01/09/2013   GERD (gastroesophageal reflux disease)    exacerbation of reflux-like symptoms 04/2011   Hiatal hernia    Hyperlipidemia    muliplt myelom 2020   Osteoarthritis of right hip 01/10/2013    SURGICAL HISTORY: Past Surgical History:  Procedure Laterality Date   CHOLECYSTECTOMY     EYE SURGERY  feb 2016   cataract right eye   LEG SURGERY Left    TONSILLECTOMY     TOTAL HIP ARTHROPLASTY Right 01/10/2013   Procedure: TOTAL HIP ARTHROPLASTY;  Surgeon: Neville Barbone, MD;  Location: [facility] OR;  Service: Orthopedics;  Laterality: Right;    SOCIAL HISTORY: Social History   Socioeconomic History   Marital  status: Married    Spouse name: Not on file   Number of children: 0   Years of education: Not on file   Highest education level: Some college, no degree  Occupational History   Occupation: retired  Tobacco Use   Smoking status: Former    Current packs/day: 0.00    Types: Cigarettes    Quit date: 01/10/1972    Years since quitting: 51.9   Smokeless tobacco: Never   Tobacco comments:    quit  1973  Vaping Use   Vaping status: Never Used  Substance and Sexual Activity   Alcohol use: No   Drug use: No   Sexual activity: Not on file  Other Topics Concern   Not on file  Social History Narrative   Married, no children. Drinks 2 cups decaf coffee a day. Walks daily. Is active, plays golf, she and her husband go hiking frequently. 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   Social Drivers of Corporate investment banker Strain: Not on file  Food Insecurity: No Food Insecurity (01/17/2023)   Hunger Vital Sign    Worried About Running Out of Food in the Last Year: Never true    Ran Out of Food in the Last Year: Never true  Transportation Needs: No Transportation Needs (01/17/2023)   PRAPARE - Administrator, Civil Service (Medical): No    Lack of Transportation (Non-Medical): No  Physical Activity: Not on file  Stress: Not on file  Social Connections: Not on file  Intimate Partner Violence: Not At Risk (01/17/2023)   Humiliation, Afraid, Rape, and Kick questionnaire    Fear of Current or Ex-Partner: No    Emotionally Abused: No    Physically Abused: No    Sexually Abused: No    FAMILY HISTORY: Family History  Problem Relation Age of Onset   Congestive Heart Failure Mother    Arthritis-Osteo Mother    Colon cancer Mother    Heart attack Father    Rheum arthritis Father    Colon cancer Maternal Aunt    Colon cancer Maternal Aunt    Hypothyroidism Sister     ALLERGIES:  is allergic to kyprolis  [carfilzomib ], pomalyst  [pomalidomide ], revlimid  [lenalidomide ], betadine [povidone iodine], flagyl [metronidazole], fosamax [alendronate sodium], bactrim [sulfamethoxazole-trimethoprim], clindamycin/lincomycin, doxepin hcl, hydrocodone -acetaminophen , hydroxyzine, hydroxyzine hcl, tramadol hcl, valium [diazepam], actonel [risedronate sodium], boniva [ibandronate], doxycycline, and penicillins.  MEDICATIONS:  Current Outpatient Medications  Medication Sig  Dispense Refill   acetaminophen  (TYLENOL ) 500 MG tablet Take 1,000 mg by mouth See admin instructions. Take 1,000 mg by mouth after breakfast, at 3:30 PM, and at 8:30 PM     albuterol  (VENTOLIN  HFA) 108 (90 Base) MCG/ACT inhaler SMARTSIG:1 Puff(s) By Mouth Every 4-6 Hours PRN     augmented betamethasone dipropionate (DIPROLENE-AF) 0.05 % cream SMARTSIG:sparingly Topical Twice Daily PRN     bismuth subsalicylate (PEPTO BISMOL) 262 MG/15ML suspension Take 30 mLs by mouth every 6 (six) hours as needed.     famotidine  (PEPCID ) 20 MG tablet Take 1 tablet (20 mg total) by mouth daily. 30 tablet 0   famotidine -calcium carbonate-magnesium hydroxide (PEPCID  COMPLETE) 10-800-165 MG chewable tablet Chew 1 tablet by mouth daily as needed (for heartburn or indigestion).      guaiFENesin  (MUCINEX ) 600 MG 12 hr tablet Take 1 tablet (600 mg total) by mouth 2 (two) times daily. 40 tablet 0   hydrocortisone 2.5 % cream Apply topically 2 (two) times daily.     loperamide  (IMODIUM ) 2  MG capsule Take 1 capsule (2 mg total) by mouth 3 (three) times daily between meals as needed for diarrhea or loose stools. 30 capsule 1   triamcinolone  cream (KENALOG ) 0.1 % Apply 1 Application topically 2 (two) times daily. (Patient taking differently: Apply 1 Application topically 2 (two) times daily as needed (for allergic flares- affected areas).) 453.6 g 2   VASELINE PURE ULTRA WHITE ointment Apply 1 application  topically See admin instructions. Apply to the genitalia in the morning and at bedtime     acyclovir  (ZOVIRAX ) 400 MG tablet Take 1 tablet (400 mg total) by mouth 2 (two) times daily. (Patient not taking: Reported on 01/16/2023) 60 tablet 3   No current facility-administered medications for this visit.   Facility-Administered Medications Ordered in Other Visits  Medication Dose Route Frequency Provider Last Rate Last Admin   carfilzomib  (KYPROLIS ) 120 mg in dextrose  5 % 100 mL chemo infusion  70 mg/m2 (Treatment Plan  Recorded) Intravenous Once Ander Bame, MD        REVIEW OF SYSTEMS:   Constitutional: ( - ) fevers, ( - )  chills , ( - ) night sweats Eyes: ( - ) blurriness of vision, ( - ) double vision, ( - ) watery eyes Ears, nose, mouth, throat, and face: ( - ) mucositis, ( - ) sore throat Respiratory: ( - ) cough, ( - ) dyspnea, ( - ) wheezes Cardiovascular: ( - ) palpitation, ( - ) chest discomfort, ( - ) lower extremity swelling Gastrointestinal:  ( - ) nausea, ( - ) heartburn, ( - ) change in bowel habits Skin: ( +) abnormal skin rashes Lymphatics: ( - ) new lymphadenopathy, ( - ) easy bruising Neurological: ( - ) numbness, ( - ) tingling, ( - ) new weaknesses Behavioral/Psych: ( - ) mood change, ( - ) new changes  All other systems were reviewed with the patient and are negative.  PHYSICAL EXAMINATION: ECOG PERFORMANCE STATUS: 1 - Symptomatic but completely ambulatory  Vitals:   12/02/23 1358 12/02/23 1359  BP: (!) 153/78 (!) 145/74  Pulse: 64   Resp: 19   Temp: 97.9 F (36.6 C)   SpO2: 99%      Filed Weights   12/02/23 1358  Weight: 127 lb 4.8 oz (57.7 kg)     GENERAL: well appearing female in NAD  SKIN: skin color, texture, turgor are normal. Diffuse, flat erythematous rash.  EYES: conjunctiva are pink and non-injected, sclera clear LUNGS: clear to auscultation and percussion with normal breathing effort HEART: regular rate & rhythm and no murmurs and no lower extremity edema Musculoskeletal: no cyanosis of digits and no clubbing  PSYCH: alert & oriented x 3, fluent speech NEURO: no focal motor/sensory deficits  LABORATORY DATA:  I have reviewed the data as listed    Latest Ref Rng & Units 12/02/2023    1:09 PM 11/19/2023    8:48 AM 11/05/2023    9:36 AM  CBC  WBC 4.0 - 10.5 K/uL 9.0  9.9  10.6   Hemoglobin 12.0 - 15.0 g/dL 65.7  84.6  96.2   Hematocrit 36.0 - 46.0 % 39.9  41.7  38.8   Platelets 150 - 400 K/uL 550  507  453        Latest Ref Rng & Units  12/02/2023    1:09 PM 11/19/2023    8:48 AM 11/05/2023    9:36 AM  CMP  Glucose 70 - 99 mg/dL 82  952  102   BUN 8 - 23 mg/dL 10  10  11    Creatinine 0.44 - 1.00 mg/dL 8.46  9.62  9.52   Sodium 135 - 145 mmol/L 138  139  138   Potassium 3.5 - 5.1 mmol/L 4.5  4.1  4.0   Chloride 98 - 111 mmol/L 104  103  105   CO2 22 - 32 mmol/L 30  27  26    Calcium 8.9 - 10.3 mg/dL 9.4  9.3  8.8   Total Protein 6.5 - 8.1 g/dL 5.9  6.4  6.0   Total Bilirubin 0.0 - 1.2 mg/dL 0.6  0.6  0.4   Alkaline Phos 38 - 126 U/L 49  54  47   AST 15 - 41 U/L 19  22  15    ALT 0 - 44 U/L 15  18  9      ASSESSMENT & PLAN Nicole Keith is a 83 y.o.female who presents to the clinic for follow up for multiple myeloma.  #IgG Lambda Multiple Myeloma: --Initially presented in April 2016 with M spike of 3.2 g/dL and IgG level of 8413. Underwent bone marrow biopsy from 11/20/2014 show 29% plasma cells. 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  --In December 2019 with rising M spike of 5.9 g/dL and worsening anemia, with Hgb 8.7, she met criteria for transformation of active multiple myeloma.  --From 07/27/2018-09/2018, received weekly velcade  plus dexamethasone . Therapy changed due to poor response.  --In 09/2018, started Revlimid  25 mg for 21 days on, 7 days off of a 28 day cycle with dexamethasone . Therapy was held after 2 cycles because of severe dermatological toxicity --In 12/2018, switched to Pomalyst  1 mg daily for 21 days.  She completed 4 cycles of therapy and therapy was held due to occurrence of dermatological toxicity.  --In 07/2019, switch to Ninlaro  4 mg weekly with dexamethasone  20 mg weekly for 3 weeks on and 1 week off. Daratumumab  was addd in April 2021. Ninlaro  was discontinued in November 2021. --On 05/18/2020, started monthly daratumumab  and transitioned to q 3 month schedule in April 2022.  --On 11/20/2022, switch to Kyprolis /Dex due to rising M-protein and bone survey that showed worsening  lytic lesions. --Switched to biweekly infusion of Kyprolis /Dex per patient request  as she wants to prioritize her quality of life PLAN:   --Due for cycle 13, Day 1 today. --Labs today show white blood cell count 9.0, hemoglobin 13.2, MCV 97.8, platelets 550, Creatinine and LFTs in range.  --Most recent myeloma labs from 11/05/2023 showed M protein to 0.4.Nicole Keith --RTC in 2 weeks for continuation of Kyprolis /Dex with continued biweekly treatments.   #Diffuse erythematous rash-stable --Managed by dermatology.  Patient prescribed 3 different steroid creams with instructions on how to use them which areas to apply. -- nystatin  powder for groin area.   #Supportive Care -- port not required but can be placed if requested.  -- zofran  8mg  q8H PRN and compazine  10mg  PO q6H for nausea -- acyclovir  400mg  PO BID for VCZ prophylaxis -- no pain medication required at this time.  -- discussed role for zometa  therapy in the setting her lytic lesions. Explained zometa  is given q 12 weeks and requires a dental clearance.  After discussing this with the patient further today she knows she would like to hold on this for the time being as she is afraid of the side effects.  Strongly emphasized that she should reconsider as this will be helpful with her lytic bone lesions.  No  orders of the defined types were placed in this encounter.  All questions were answered. The patient knows to call the clinic with any problems, questions or concerns.  I have spent a total of 30 minutes minutes of face-to-face and non-face-to-face time, preparing to see the patient,  performing a medically appropriate examination, counseling and educating the patient, ordering medications/tests/procedures,documenting clinical information in the electronic health record, and care coordination.   Wyline Hearing PA-C Dept of Hematology and Oncology [facility] Phone: [phone]

## 2023-12-03 LAB — KAPPA/LAMBDA LIGHT CHAINS
Kappa free light chain: 1.3 mg/L — ABNORMAL LOW (ref 3.3–19.4)
Kappa, lambda light chain ratio: 0.09 — ABNORMAL LOW (ref 0.26–1.65)
Lambda free light chains: 13.7 mg/L (ref 5.7–26.3)

## 2023-12-04 ENCOUNTER — Other Ambulatory Visit: Payer: Medicare Other

## 2023-12-04 ENCOUNTER — Ambulatory Visit: Payer: Medicare Other | Admitting: Hematology and Oncology

## 2023-12-04 ENCOUNTER — Ambulatory Visit: Payer: Medicare Other

## 2023-12-04 LAB — MULTIPLE MYELOMA PANEL, SERUM
Albumin SerPl Elph-Mcnc: 3.5 g/dL (ref 2.9–4.4)
Albumin/Glob SerPl: 1.6 (ref 0.7–1.7)
Alpha 1: 0.2 g/dL (ref 0.0–0.4)
Alpha2 Glob SerPl Elph-Mcnc: 0.5 g/dL (ref 0.4–1.0)
B-Globulin SerPl Elph-Mcnc: 0.8 g/dL (ref 0.7–1.3)
Gamma Glob SerPl Elph-Mcnc: 0.6 g/dL (ref 0.4–1.8)
Globulin, Total: 2.2 g/dL (ref 2.2–3.9)
IgA: 9 mg/dL — ABNORMAL LOW (ref 64–422)
IgG (Immunoglobin G), Serum: 656 mg/dL (ref 586–1602)
IgM (Immunoglobulin M), Srm: 7 mg/dL — ABNORMAL LOW (ref 26–217)
M Protein SerPl Elph-Mcnc: 0.5 g/dL — ABNORMAL HIGH
Total Protein ELP: 5.7 g/dL — ABNORMAL LOW (ref 6.0–8.5)

## 2023-12-09 DIAGNOSIS — Z961 Presence of intraocular lens: Secondary | ICD-10-CM | POA: Diagnosis not present

## 2023-12-09 DIAGNOSIS — H01006 Unspecified blepharitis left eye, unspecified eyelid: Secondary | ICD-10-CM | POA: Diagnosis not present

## 2023-12-09 DIAGNOSIS — H01003 Unspecified blepharitis right eye, unspecified eyelid: Secondary | ICD-10-CM | POA: Diagnosis not present

## 2023-12-12 ENCOUNTER — Other Ambulatory Visit: Payer: Self-pay

## 2023-12-14 ENCOUNTER — Telehealth: Payer: Self-pay | Admitting: Hematology and Oncology

## 2023-12-16 ENCOUNTER — Other Ambulatory Visit

## 2023-12-16 ENCOUNTER — Ambulatory Visit: Admitting: Physician Assistant

## 2023-12-16 ENCOUNTER — Ambulatory Visit

## 2023-12-18 ENCOUNTER — Ambulatory Visit: Payer: Medicare Other

## 2023-12-18 ENCOUNTER — Other Ambulatory Visit: Payer: Medicare Other

## 2023-12-18 ENCOUNTER — Ambulatory Visit: Payer: Medicare Other | Admitting: Physician Assistant

## 2023-12-30 ENCOUNTER — Inpatient Hospital Stay

## 2023-12-30 ENCOUNTER — Inpatient Hospital Stay: Admitting: Hematology and Oncology

## 2023-12-30 ENCOUNTER — Inpatient Hospital Stay: Attending: Physician Assistant

## 2023-12-30 VITALS — BP 144/74 | HR 66 | Temp 98.6°F | Resp 13 | Wt 124.9 lb

## 2023-12-30 VITALS — BP 143/81 | HR 85 | Temp 98.1°F | Resp 15

## 2023-12-30 DIAGNOSIS — Z79899 Other long term (current) drug therapy: Secondary | ICD-10-CM | POA: Insufficient documentation

## 2023-12-30 DIAGNOSIS — C9000 Multiple myeloma not having achieved remission: Secondary | ICD-10-CM | POA: Insufficient documentation

## 2023-12-30 DIAGNOSIS — Z5112 Encounter for antineoplastic immunotherapy: Secondary | ICD-10-CM | POA: Diagnosis not present

## 2023-12-30 LAB — CBC WITH DIFFERENTIAL (CANCER CENTER ONLY)
Abs Immature Granulocytes: 0.03 10*3/uL (ref 0.00–0.07)
Basophils Absolute: 0.1 10*3/uL (ref 0.0–0.1)
Basophils Relative: 1 %
Eosinophils Absolute: 0.2 10*3/uL (ref 0.0–0.5)
Eosinophils Relative: 2 %
HCT: 37.4 % (ref 36.0–46.0)
Hemoglobin: 12.6 g/dL (ref 12.0–15.0)
Immature Granulocytes: 0 %
Lymphocytes Relative: 24 %
Lymphs Abs: 1.9 10*3/uL (ref 0.7–4.0)
MCH: 32.5 pg (ref 26.0–34.0)
MCHC: 33.7 g/dL (ref 30.0–36.0)
MCV: 96.4 fL (ref 80.0–100.0)
Monocytes Absolute: 0.8 10*3/uL (ref 0.1–1.0)
Monocytes Relative: 10 %
Neutro Abs: 5 10*3/uL (ref 1.7–7.7)
Neutrophils Relative %: 63 %
Platelet Count: 408 10*3/uL — ABNORMAL HIGH (ref 150–400)
RBC: 3.88 MIL/uL (ref 3.87–5.11)
RDW: 12.2 % (ref 11.5–15.5)
WBC Count: 7.9 10*3/uL (ref 4.0–10.5)
nRBC: 0 % (ref 0.0–0.2)

## 2023-12-30 LAB — CMP (CANCER CENTER ONLY)
ALT: 10 U/L (ref 0–44)
AST: 16 U/L (ref 15–41)
Albumin: 3.9 g/dL (ref 3.5–5.0)
Alkaline Phosphatase: 55 U/L (ref 38–126)
Anion gap: 6 (ref 5–15)
BUN: 10 mg/dL (ref 8–23)
CO2: 30 mmol/L (ref 22–32)
Calcium: 9.4 mg/dL (ref 8.9–10.3)
Chloride: 103 mmol/L (ref 98–111)
Creatinine: 0.66 mg/dL (ref 0.44–1.00)
GFR, Estimated: >60 mL/min (ref >60)
Glucose, Bld: 93 mg/dL (ref 70–99)
Potassium: 4.2 mmol/L (ref 3.5–5.1)
Sodium: 139 mmol/L (ref 135–145)
Total Bilirubin: 0.5 mg/dL (ref 0.0–1.2)
Total Protein: 5.9 g/dL — ABNORMAL LOW (ref 6.5–8.1)

## 2023-12-30 MED ORDER — SODIUM CHLORIDE 0.9 % IV SOLN: Freq: Once | INTRAVENOUS | Status: AC

## 2023-12-30 MED ORDER — SODIUM CHLORIDE 0.9 % IV SOLN
40.0000 mg | Freq: Once | INTRAVENOUS | Status: AC
  Administered 2023-12-30: 40 mg via INTRAVENOUS
  Filled 2023-12-30: qty 4

## 2023-12-30 MED ORDER — DEXTROSE 5 % IV SOLN
70.0000 mg/m2 | Freq: Once | INTRAVENOUS | Status: AC
  Administered 2023-12-30: 120 mg via INTRAVENOUS
  Filled 2023-12-30: qty 60
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
## 2023-12-30 NOTE — Patient Instructions (Signed)
 [facility] - A DEPT OF [facility]  Discharge Instructions: Thank you for choosing [facility] to provide your oncology and hematology care.   If you have a lab appointment with the Cancer Center, please go directly to the Cancer Center and check in at the registration area.   Wear comfortable clothing and clothing appropriate for easy access to any Portacath or PICC line.   We strive to give you quality time with your provider. You may need to reschedule your appointment if you arrive late (15 or more minutes).  Arriving late affects you and other patients whose appointments are after yours.  Also, if you miss three or more appointments without notifying the office, you may be dismissed from the clinic at the provider's discretion.      For prescription refill requests, have your pharmacy contact our office and allow 72 hours for refills to be completed.    Today you received the following chemotherapy and/or immunotherapy agent: Carfilzomib (Kyprolis)      To help prevent nausea and vomiting after your treatment, we encourage you to take your nausea medication as directed.  BELOW ARE SYMPTOMS THAT SHOULD BE REPORTED IMMEDIATELY: *FEVER GREATER THAN 100.4 F (38 C) OR HIGHER *CHILLS OR SWEATING *NAUSEA AND VOMITING THAT IS NOT CONTROLLED WITH YOUR NAUSEA MEDICATION *UNUSUAL SHORTNESS OF BREATH *UNUSUAL BRUISING OR BLEEDING *URINARY PROBLEMS (pain or burning when urinating, or frequent urination) *BOWEL PROBLEMS (unusual diarrhea, constipation, pain near the anus) TENDERNESS IN MOUTH AND THROAT WITH OR WITHOUT PRESENCE OF ULCERS (sore throat, sores in mouth, or a toothache) UNUSUAL RASH, SWELLING OR PAIN  UNUSUAL VAGINAL DISCHARGE OR ITCHING   Items with * indicate a potential emergency and should be followed up as soon as possible or go to the Emergency Department if any problems should occur.  Please show the CHEMOTHERAPY ALERT CARD or  IMMUNOTHERAPY ALERT CARD at check-in to the Emergency Department and triage nurse.  Should you have questions after your visit or need to cancel or reschedule your appointment, please contact [facility] - [facility]  Dept: [phone]  and follow the prompts.  Office hours are 8:00 a.m. to 4:30 p.m. Monday - Friday. Please note that voicemails left after 4:00 p.m. may not be returned until the following business day.  We are closed weekends and major holidays. You have access to a nurse at all times for urgent questions. Please call the main number to the clinic Dept: [phone] and follow the prompts.   For any non-urgent questions, you may also contact your provider using MyChart. We now offer e-Visits for anyone 81 and older to request care online for non-urgent symptoms. For details visit [email or web address].   Also download the MyChart app! Go to the app store, search "MyChart", open the app, select [place], and log in with your MyChart username and password.

## 2023-12-31 LAB — KAPPA/LAMBDA LIGHT CHAINS
Kappa free light chain: 3.8 mg/L (ref 3.3–19.4)
Kappa, lambda light chain ratio: 0.17 — ABNORMAL LOW (ref 0.26–1.65)
Lambda free light chains: 22.9 mg/L (ref 5.7–26.3)

## 2024-01-01 ENCOUNTER — Ambulatory Visit: Admitting: Physician Assistant

## 2024-01-01 ENCOUNTER — Other Ambulatory Visit: Payer: Self-pay

## 2024-01-01 ENCOUNTER — Ambulatory Visit

## 2024-01-01 ENCOUNTER — Other Ambulatory Visit

## 2024-01-01 LAB — MULTIPLE MYELOMA PANEL, SERUM
Albumin SerPl Elph-Mcnc: 3.3 g/dL (ref 2.9–4.4)
Albumin/Glob SerPl: 1.4 (ref 0.7–1.7)
Alpha 1: 0.3 g/dL (ref 0.0–0.4)
Alpha2 Glob SerPl Elph-Mcnc: 0.7 g/dL (ref 0.4–1.0)
B-Globulin SerPl Elph-Mcnc: 0.8 g/dL (ref 0.7–1.3)
Gamma Glob SerPl Elph-Mcnc: 0.7 g/dL (ref 0.4–1.8)
Globulin, Total: 2.5 g/dL (ref 2.2–3.9)
IgA: 11 mg/dL — ABNORMAL LOW (ref 64–422)
IgG (Immunoglobin G), Serum: 707 mg/dL (ref 586–1602)
IgM (Immunoglobulin M), Srm: 10 mg/dL — ABNORMAL LOW (ref 26–217)
M Protein SerPl Elph-Mcnc: 0.4 g/dL — ABNORMAL HIGH
Total Protein ELP: 5.8 g/dL — ABNORMAL LOW (ref 6.0–8.5)

## 2024-01-09 ENCOUNTER — Encounter: Payer: Self-pay | Admitting: Hematology and Oncology

## 2024-01-12 ENCOUNTER — Other Ambulatory Visit: Payer: Self-pay | Admitting: Hematology and Oncology

## 2024-01-12 DIAGNOSIS — C9000 Multiple myeloma not having achieved remission: Secondary | ICD-10-CM

## 2024-01-13 ENCOUNTER — Inpatient Hospital Stay

## 2024-01-13 ENCOUNTER — Inpatient Hospital Stay: Admitting: Hematology and Oncology

## 2024-01-13 ENCOUNTER — Inpatient Hospital Stay: Attending: Physician Assistant

## 2024-01-13 VITALS — BP 136/74 | HR 64 | Temp 98.1°F | Resp 15 | Wt 125.4 lb

## 2024-01-13 DIAGNOSIS — C9000 Multiple myeloma not having achieved remission: Secondary | ICD-10-CM | POA: Diagnosis not present

## 2024-01-13 DIAGNOSIS — Z79899 Other long term (current) drug therapy: Secondary | ICD-10-CM | POA: Diagnosis not present

## 2024-01-13 DIAGNOSIS — Z5112 Encounter for antineoplastic immunotherapy: Secondary | ICD-10-CM | POA: Diagnosis not present

## 2024-01-13 LAB — CBC WITH DIFFERENTIAL (CANCER CENTER ONLY)
Abs Immature Granulocytes: 0.04 10*3/uL (ref 0.00–0.07)
Basophils Absolute: 0.1 10*3/uL (ref 0.0–0.1)
Basophils Relative: 1 %
Eosinophils Absolute: 0.2 10*3/uL (ref 0.0–0.5)
Eosinophils Relative: 1 %
HCT: 38.9 % (ref 36.0–46.0)
Hemoglobin: 12.9 g/dL (ref 12.0–15.0)
Immature Granulocytes: 0 %
Lymphocytes Relative: 23 %
Lymphs Abs: 2.4 10*3/uL (ref 0.7–4.0)
MCH: 31.8 pg (ref 26.0–34.0)
MCHC: 33.2 g/dL (ref 30.0–36.0)
MCV: 95.8 fL (ref 80.0–100.0)
Monocytes Absolute: 0.8 10*3/uL (ref 0.1–1.0)
Monocytes Relative: 7 %
Neutro Abs: 7.2 10*3/uL (ref 1.7–7.7)
Neutrophils Relative %: 68 %
Platelet Count: 443 10*3/uL — ABNORMAL HIGH (ref 150–400)
RBC: 4.06 MIL/uL (ref 3.87–5.11)
RDW: 12.6 % (ref 11.5–15.5)
WBC Count: 10.6 10*3/uL — ABNORMAL HIGH (ref 4.0–10.5)
nRBC: 0 % (ref 0.0–0.2)

## 2024-01-13 LAB — LACTATE DEHYDROGENASE: LDH: 203 U/L — ABNORMAL HIGH (ref 98–192)

## 2024-01-13 LAB — CMP (CANCER CENTER ONLY)
ALT: 12 U/L (ref 0–44)
AST: 19 U/L (ref 15–41)
Albumin: 4 g/dL (ref 3.5–5.0)
Alkaline Phosphatase: 50 U/L (ref 38–126)
Anion gap: 6 (ref 5–15)
BUN: 11 mg/dL (ref 8–23)
CO2: 27 mmol/L (ref 22–32)
Calcium: 9.6 mg/dL (ref 8.9–10.3)
Chloride: 105 mmol/L (ref 98–111)
Creatinine: 0.63 mg/dL (ref 0.44–1.00)
GFR, Estimated: >60 mL/min (ref >60)
Glucose, Bld: 101 mg/dL — ABNORMAL HIGH (ref 70–99)
Potassium: 4.3 mmol/L (ref 3.5–5.1)
Sodium: 138 mmol/L (ref 135–145)
Total Bilirubin: 0.5 mg/dL (ref 0.0–1.2)
Total Protein: 6.4 g/dL — ABNORMAL LOW (ref 6.5–8.1)

## 2024-01-13 MED ORDER — DEXTROSE 5 % IV SOLN
70.0000 mg/m2 | Freq: Once | INTRAVENOUS | Status: AC
  Administered 2024-01-13: 120 mg via INTRAVENOUS
  Filled 2024-01-13: qty 60

## 2024-01-13 MED ORDER — SODIUM CHLORIDE 0.9 % IV SOLN: Freq: Once | INTRAVENOUS | Status: AC

## 2024-01-13 MED ORDER — SODIUM CHLORIDE 0.9 % IV SOLN
40.0000 mg | Freq: Once | INTRAVENOUS | Status: AC
  Administered 2024-01-13: 40 mg via INTRAVENOUS
  Filled 2024-01-13: qty 4

## 2024-01-13 NOTE — Progress Notes (Signed)
 [facility] [phone]   [phone]  PROGRESS NOTE  Patient Care Team: Ronna Coho, MD as PCP - General (Family Medicine)  CHIEF COMPLAINTS/PURPOSE OF CONSULTATION:  IgG Lambda Multiple Myeloma  ONCOLOGIC HISTORY: 07/27/2018-09/2018: Received weekly velcade  plus dexamethasone . Therapy changed due to poor response.  09/2018: Started Revlimid  25 mg for 21 days on, 7 days off of a 28 day cycle with dexamethasone . Therapy was held after 2 cycles because of severe dermatological toxicity 12/2018: Pomalyst  1 mg daily for 21 days.  She completed 4 cycles of therapy and therapy was held due to occurrence of dermatological toxicity.  07/2019: Started Ninlaro  4 mg weekly with dexamethasone  20 mg weekly for 3 weeks on and 1 week off. Daratumumab  was addd in April 2021. Ninlaro  was discontinued in November 2021. 05/18/2020: Started monthly daratumumab  and transitioned to q 3 month schedule in April 2022.  11/06/2022: Darzalex  discontinued due to progression of disease. M protein 0.9 with worsening lytic lesions in the skeleton.  11/20/2022: Cycle 1 Day 1 of Kyprolis /Dex therapy.  12/19/2022: Cycle 2 Day 1 of Kyprolis /Dex therapy 01/15/2023: Cycle 3 Day 1 of Kyprolis /Dex therapy 02/19/2023: Cycle 4 Day 1 of Kyprolis /Dex therapy 03/18/2023: Cycle 5 Day 1 of Kyprolis /Dex therapy 04/25/2023: Cycle 6 Day 1 of Kyprolis /Dex therapy 05/28/2023: Cycle 7 Day 1 of Kyprolis /Dex therapy 06/11/2023: Cycle 8 Day 1 of Kyprolis /Dex therapy 07/08/2023 Cycle 9 Day 1 of Kyprolis /Dex therapy 09/11/2023 Cycle 10 Day 1 of Kyprolis /Dex therapy 10/09/2023: Cycle 11 Day 1 of Kyprolis /Dex therapy 11/05/2023: Cycle 12 Day 1 of Kyprolis /Dex therapy 12/02/2023: Cycle 13 Day 1 of Kyprolis /Dex therapy 01/17/2024: Cycle 14 Day 1 of Kyprolis /Dex therapy  HISTORY OF PRESENTING ILLNESS:  Nicole Keith 83 y.o. female returns for a follow up for IgG lambda multiple myeloma. She was last seen on  12/30/2023. She presents today for Cycle 14, Day 1 of Kyprolis /Dex.   On exam today, Ms. Matulich reports she has been well overall in the interim since her last visit, though she is still deeply saddened by [place]  She reports that she continues to have rash on her arms and legs bilaterally which is helped by the creams as prescribed by dermatology.  She reports that she occasionally takes Tylenol  PM for sleep but it does not work particularly well.  She reports that she does get up to urinate quite a bit at night, typically every 2 and half hours.  She notes that she is doing her best to try to cut back on hydration at night so that she does not have so much nighttime bathroom wake ups.  She reports otherwise that she has been eating well.  She does enjoy to go to [place] and eats steak, hamburger, and fresh fish.  She knows the chef there who has been a friend for 24 years.  Otherwise she is willing and able to continue on Kyprolis  therapy at this time.  Is not causing any side effects.  She is always interested in reducing the number of dosages she has to receive, but understands her situation and is willing and able to continue at this time.  A full 10 point ROS is otherwise negative.  MEDICAL HISTORY:  Past Medical History:  Diagnosis Date   Allergic rhinitis    Arthritis    Colitis, collagenous    Diverticular disease    severe sig tics/fixation 2012   Family hx of colon cancer    Fracture of  femoral neck, right (HCC) 01/09/2013   GERD (gastroesophageal reflux disease)    exacerbation of reflux-like symptoms 04/2011   Hiatal hernia    Hyperlipidemia    muliplt myelom 2020   Osteoarthritis of right hip 01/10/2013    SURGICAL HISTORY: Past Surgical History:  Procedure Laterality Date   CHOLECYSTECTOMY     EYE SURGERY  feb 2016   cataract right eye   LEG SURGERY Left    TONSILLECTOMY     TOTAL HIP ARTHROPLASTY Right 01/10/2013   Procedure: TOTAL HIP  ARTHROPLASTY;  Surgeon: Neville Barbone, MD;  Location: [facility] OR;  Service: Orthopedics;  Laterality: Right;    SOCIAL HISTORY: Social History   Socioeconomic History   Marital status: Married    Spouse name: Not on file   Number of children: 0   Years of education: Not on file   Highest education level: Some college, no degree  Occupational History   Occupation: retired  Tobacco Use   Smoking status: Former    Current packs/day: 0.00    Types: Cigarettes    Quit date: 01/10/1972    Years since quitting: 52.0   Smokeless tobacco: Never   Tobacco comments:    quit 1973  Vaping Use   Vaping status: Never Used  Substance and Sexual Activity   Alcohol use: No   Drug use: No   Sexual activity: Not on file  Other Topics Concern   Not on file  Social History Narrative   Married, no children. Drinks 2 cups decaf coffee a day. Walks daily. Is active, plays golf, she and her husband go hiking frequently. 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   Social Drivers of Corporate investment banker Strain: Not on file  Food Insecurity: No Food Insecurity (01/17/2023)   Hunger Vital Sign    Worried About Running Out of Food in the Last Year: Never true    Ran Out of Food in the Last Year: Never true  Transportation Needs: No Transportation Needs (01/17/2023)   PRAPARE - Administrator, Civil Service (Medical): No    Lack of Transportation (Non-Medical): No  Physical Activity: Not on file  Stress: Not on file  Social Connections: Not on file  Intimate Partner Violence: Not At Risk (01/17/2023)   Humiliation, Afraid, Rape, and Kick questionnaire    Fear of Current or Ex-Partner: No    Emotionally Abused: No    Physically Abused: No    Sexually Abused: No    FAMILY HISTORY: Family History  Problem Relation Age of Onset   Congestive Heart Failure Mother    Arthritis-Osteo Mother    Colon cancer Mother    Heart attack Father    Rheum arthritis Father    Colon  cancer Maternal Aunt    Colon cancer Maternal Aunt    Hypothyroidism Sister     ALLERGIES:  is allergic to kyprolis  [carfilzomib ], pomalyst  [pomalidomide ], revlimid  [lenalidomide ], betadine [povidone iodine], flagyl [metronidazole], fosamax [alendronate sodium], bactrim [sulfamethoxazole-trimethoprim], clindamycin/lincomycin, doxepin hcl, hydrocodone -acetaminophen , hydroxyzine, hydroxyzine hcl, tramadol hcl, valium [diazepam], actonel [risedronate sodium], boniva [ibandronate], doxycycline, and penicillins.  MEDICATIONS:  Current Outpatient Medications  Medication Sig Dispense Refill   acetaminophen  (TYLENOL ) 500 MG tablet Take 1,000 mg by mouth See admin instructions. Take 1,000 mg by mouth after breakfast, at 3:30 PM, and at 8:30 PM     acyclovir  (ZOVIRAX ) 400 MG tablet Take 1 tablet (400 mg total) by mouth 2 (two) times daily. 60 tablet 3  albuterol  (VENTOLIN  HFA) 108 (90 Base) MCG/ACT inhaler SMARTSIG:1 Puff(s) By Mouth Every 4-6 Hours PRN     augmented betamethasone dipropionate (DIPROLENE-AF) 0.05 % cream SMARTSIG:sparingly Topical Twice Daily PRN     bismuth subsalicylate (PEPTO BISMOL) 262 MG/15ML suspension Take 30 mLs by mouth every 6 (six) hours as needed.     famotidine  (PEPCID ) 20 MG tablet Take 1 tablet (20 mg total) by mouth daily. 30 tablet 0   famotidine -calcium carbonate-magnesium hydroxide (PEPCID  COMPLETE) 10-800-165 MG chewable tablet Chew 1 tablet by mouth daily as needed (for heartburn or indigestion).      guaiFENesin  (MUCINEX ) 600 MG 12 hr tablet Take 1 tablet (600 mg total) by mouth 2 (two) times daily. 40 tablet 0   hydrocortisone 2.5 % cream Apply topically 2 (two) times daily.     loperamide  (IMODIUM ) 2 MG capsule Take 1 capsule (2 mg total) by mouth 3 (three) times daily between meals as needed for diarrhea or loose stools. 30 capsule 1   triamcinolone  cream (KENALOG ) 0.1 % Apply 1 Application topically 2 (two) times daily. (Patient taking differently: Apply 1  Application topically 2 (two) times daily as needed (for allergic flares- affected areas).) 453.6 g 2   VASELINE PURE ULTRA WHITE ointment Apply 1 application  topically See admin instructions. Apply to the genitalia in the morning and at bedtime     No current facility-administered medications for this visit.    REVIEW OF SYSTEMS:   Constitutional: ( - ) fevers, ( - )  chills , ( - ) night sweats Eyes: ( - ) blurriness of vision, ( - ) double vision, ( - ) watery eyes Ears, nose, mouth, throat, and face: ( - ) mucositis, ( - ) sore throat Respiratory: ( - ) cough, ( - ) dyspnea, ( - ) wheezes Cardiovascular: ( - ) palpitation, ( - ) chest discomfort, ( - ) lower extremity swelling Gastrointestinal:  ( - ) nausea, ( - ) heartburn, ( - ) change in bowel habits Skin: ( +) abnormal skin rashes Lymphatics: ( - ) new lymphadenopathy, ( - ) easy bruising Neurological: ( - ) numbness, ( - ) tingling, ( - ) new weaknesses Behavioral/Psych: ( - ) mood change, ( - ) new changes  All other systems were reviewed with the patient and are negative.  PHYSICAL EXAMINATION: ECOG PERFORMANCE STATUS: 1 - Symptomatic but completely ambulatory  Vitals:   01/13/24 1347  BP: 136/74  Pulse: 64  Resp: 15  Temp: 98.1 F (36.7 C)  SpO2: 98%      Filed Weights   01/13/24 1347  Weight: 125 lb 6.4 oz (56.9 kg)      GENERAL: well appearing female in NAD  SKIN: skin color, texture, turgor are normal. Diffuse, flat erythematous rash.  EYES: conjunctiva are pink and non-injected, sclera clear LUNGS: clear to auscultation and percussion with normal breathing effort HEART: regular rate & rhythm and no murmurs and no lower extremity edema Musculoskeletal: no cyanosis of digits and no clubbing  PSYCH: alert & oriented x 3, fluent speech NEURO: no focal motor/sensory deficits  LABORATORY DATA:  I have reviewed the data as listed    Latest Ref Rng & Units 01/13/2024    1:06 PM 12/30/2023    1:04 PM  12/02/2023    1:09 PM  CBC  WBC 4.0 - 10.5 K/uL 10.6  7.9  9.0   Hemoglobin 12.0 - 15.0 g/dL 16.1  09.6  04.5   Hematocrit 36.0 - 46.0 %  38.9  37.4  39.9   Platelets 150 - 400 K/uL 443  408  550        Latest Ref Rng & Units 01/13/2024    1:06 PM 12/30/2023    1:04 PM 12/02/2023    1:09 PM  CMP  Glucose 70 - 99 mg/dL 960  93  82   BUN 8 - 23 mg/dL 11  10  10    Creatinine 0.44 - 1.00 mg/dL 4.54  0.98  1.19   Sodium 135 - 145 mmol/L 138  139  138   Potassium 3.5 - 5.1 mmol/L 4.3  4.2  4.5   Chloride 98 - 111 mmol/L 105  103  104   CO2 22 - 32 mmol/L 27  30  30    Calcium 8.9 - 10.3 mg/dL 9.6  9.4  9.4   Total Protein 6.5 - 8.1 g/dL 6.4  5.9  5.9   Total Bilirubin 0.0 - 1.2 mg/dL 0.5  0.5  0.6   Alkaline Phos 38 - 126 U/L 50  55  49   AST 15 - 41 U/L 19  16  19    ALT 0 - 44 U/L 12  10  15      ASSESSMENT & PLAN Nicole Keith is a 83 y.o.female who presents to the clinic for follow up for multiple myeloma.  #IgG Lambda Multiple Myeloma: --Initially presented in April 2016 with M spike of 3.2 g/dL and IgG level of 1478. Underwent bone marrow biopsy from 11/20/2014 show 29% plasma cells. 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  --In December 2019 with rising M spike of 5.9 g/dL and worsening anemia, with Hgb 8.7, she met criteria for transformation of active multiple myeloma.  --From 07/27/2018-09/2018, received weekly velcade  plus dexamethasone . Therapy changed due to poor response.  --In 09/2018, started Revlimid  25 mg for 21 days on, 7 days off of a 28 day cycle with dexamethasone . Therapy was held after 2 cycles because of severe dermatological toxicity --In 12/2018, switched to Pomalyst  1 mg daily for 21 days.  She completed 4 cycles of therapy and therapy was held due to occurrence of dermatological toxicity.  --In 07/2019, switch to Ninlaro  4 mg weekly with dexamethasone  20 mg weekly for 3 weeks on and 1 week off. Daratumumab  was addd in April 2021. Ninlaro  was  discontinued in November 2021. --On 05/18/2020, started monthly daratumumab  and transitioned to q 3 month schedule in April 2022.  --On 11/20/2022, switch to Kyprolis /Dex due to rising M-protein and bone survey that showed worsening lytic lesions. --Switched to biweekly infusion of Kyprolis /Dex per patient request  as she wants to prioritize her quality of life PLAN:   --Due for cycle 14, Day 15 today. --Labs today show white blood cell count 10.6, hemoglobin 12.9, MCV 95.8, platelets 443, Creatinine and LFTs in range.  --Most recent myeloma labs from 12/30/2023 showed M protein to 0.4 --RTC in 2 weeks for continuation of Kyprolis /Dex with continued biweekly treatments.   #Diffuse erythematous rash-stable --Managed by dermatology.  Patient prescribed 3 different steroid creams with instructions on how to use them which areas to apply. -- nystatin  powder for groin area.   #Supportive Care -- port not required but can be placed if requested.  -- zofran  8mg  q8H PRN and compazine  10mg  PO q6H for nausea -- acyclovir  400mg  PO BID for VCZ prophylaxis -- no pain medication required at this time.  -- discussed role for zometa  therapy in the setting her lytic lesions. Explained zometa   is given q 12 weeks and requires a dental clearance.  After discussing this with the patient further today she knows she would like to hold on this for the time being as she is afraid of the side effects.  Strongly emphasized that she should reconsider as this will be helpful with her lytic bone lesions.  Orders Placed This Encounter  Procedures   Multiple Myeloma Panel (SPEP&IFE w/QIG)    Standing Status:   Future    Expected Date:   03/09/2024    Expiration Date:   03/09/2025   Kappa/lambda light chains    Standing Status:   Future    Expected Date:   03/09/2024    Expiration Date:   03/09/2025   CBC with Differential (Cancer Center Only)    Standing Status:   Future    Expected Date:   03/09/2024    Expiration Date:    03/09/2025   CMP (Cancer Center only)    Standing Status:   Future    Expected Date:   03/09/2024    Expiration Date:   03/09/2025   CBC with Differential (Cancer Center Only)    Standing Status:   Future    Expected Date:   03/23/2024    Expiration Date:   03/23/2025   CMP (Cancer Center only)    Standing Status:   Future    Expected Date:   03/23/2024    Expiration Date:   03/23/2025   Multiple Myeloma Panel (SPEP&IFE w/QIG)    Standing Status:   Future    Expected Date:   05/04/2024    Expiration Date:   05/04/2025   Kappa/lambda light chains    Standing Status:   Future    Expected Date:   05/04/2024    Expiration Date:   05/04/2025   CBC with Differential (Cancer Center Only)    Standing Status:   Future    Expected Date:   05/04/2024    Expiration Date:   05/04/2025   CMP (Cancer Center only)    Standing Status:   Future    Expected Date:   05/04/2024    Expiration Date:   05/04/2025   CBC with Differential (Cancer Center Only)    Standing Status:   Future    Expected Date:   05/18/2024    Expiration Date:   05/18/2025   CMP (Cancer Center only)    Standing Status:   Future    Expected Date:   05/18/2024    Expiration Date:   05/18/2025   Multiple Myeloma Panel (SPEP&IFE w/QIG)    Standing Status:   Future    Expected Date:   04/06/2024    Expiration Date:   04/06/2025   Kappa/lambda light chains    Standing Status:   Future    Expected Date:   04/06/2024    Expiration Date:   04/06/2025   CBC with Differential (Cancer Center Only)    Standing Status:   Future    Expected Date:   04/06/2024    Expiration Date:   04/06/2025   CMP (Cancer Center only)    Standing Status:   Future    Expected Date:   04/06/2024    Expiration Date:   04/06/2025   CBC with Differential (Cancer Center Only)    Standing Status:   Future    Expected Date:   04/20/2024    Expiration Date:   04/20/2025   CMP (Cancer Center only)    Standing Status:  Future    Expected Date:   04/20/2024    Expiration  Date:   04/20/2025   All questions were answered. The patient knows to call the clinic with any problems, questions or concerns.  I have spent a total of 30 minutes minutes of face-to-face and non-face-to-face time, preparing to see the patient,  performing a medically appropriate examination, counseling and educating the patient, ordering medications/tests/procedures,documenting clinical information in the electronic health record, and care coordination.   Rogerio Clay, MD Department of Hematology/Oncology [facility] Phone: [phone] Pager: [phone] Email: [email or web address]

## 2024-01-13 NOTE — Patient Instructions (Signed)
 [facility] - A DEPT OF [facility]  Discharge Instructions: Thank you for choosing [facility] to provide your oncology and hematology care.   If you have a lab appointment with the Cancer Center, please go directly to the Cancer Center and check in at the registration area.   Wear comfortable clothing and clothing appropriate for easy access to any Portacath or PICC line.   We strive to give you quality time with your provider. You may need to reschedule your appointment if you arrive late (15 or more minutes).  Arriving late affects you and other patients whose appointments are after yours.  Also, if you miss three or more appointments without notifying the office, you may be dismissed from the clinic at the provider's discretion.      For prescription refill requests, have your pharmacy contact our office and allow 72 hours for refills to be completed.    Today you received the following chemotherapy and/or immunotherapy agent: Carfilzomib (Kyprolis)      To help prevent nausea and vomiting after your treatment, we encourage you to take your nausea medication as directed.  BELOW ARE SYMPTOMS THAT SHOULD BE REPORTED IMMEDIATELY: *FEVER GREATER THAN 100.4 F (38 C) OR HIGHER *CHILLS OR SWEATING *NAUSEA AND VOMITING THAT IS NOT CONTROLLED WITH YOUR NAUSEA MEDICATION *UNUSUAL SHORTNESS OF BREATH *UNUSUAL BRUISING OR BLEEDING *URINARY PROBLEMS (pain or burning when urinating, or frequent urination) *BOWEL PROBLEMS (unusual diarrhea, constipation, pain near the anus) TENDERNESS IN MOUTH AND THROAT WITH OR WITHOUT PRESENCE OF ULCERS (sore throat, sores in mouth, or a toothache) UNUSUAL RASH, SWELLING OR PAIN  UNUSUAL VAGINAL DISCHARGE OR ITCHING   Items with * indicate a potential emergency and should be followed up as soon as possible or go to the Emergency Department if any problems should occur.  Please show the CHEMOTHERAPY ALERT CARD or  IMMUNOTHERAPY ALERT CARD at check-in to the Emergency Department and triage nurse.  Should you have questions after your visit or need to cancel or reschedule your appointment, please contact [facility] - [facility]  Dept: [phone]  and follow the prompts.  Office hours are 8:00 a.m. to 4:30 p.m. Monday - Friday. Please note that voicemails left after 4:00 p.m. may not be returned until the following business day.  We are closed weekends and major holidays. You have access to a nurse at all times for urgent questions. Please call the main number to the clinic Dept: [phone] and follow the prompts.   For any non-urgent questions, you may also contact your provider using MyChart. We now offer e-Visits for anyone 81 and older to request care online for non-urgent symptoms. For details visit [email or web address].   Also download the MyChart app! Go to the app store, search "MyChart", open the app, select [place], and log in with your MyChart username and password.

## 2024-01-14 LAB — KAPPA/LAMBDA LIGHT CHAINS
Kappa free light chain: 1.7 mg/L — ABNORMAL LOW (ref 3.3–19.4)
Kappa, lambda light chain ratio: 0.09 — ABNORMAL LOW (ref 0.26–1.65)
Lambda free light chains: 19.1 mg/L (ref 5.7–26.3)

## 2024-01-15 ENCOUNTER — Other Ambulatory Visit

## 2024-01-15 ENCOUNTER — Ambulatory Visit: Admitting: Hematology and Oncology

## 2024-01-15 ENCOUNTER — Ambulatory Visit

## 2024-01-15 ENCOUNTER — Other Ambulatory Visit: Payer: Self-pay

## 2024-01-17 ENCOUNTER — Encounter: Payer: Self-pay | Admitting: Hematology and Oncology

## 2024-01-18 LAB — MULTIPLE MYELOMA PANEL, SERUM
Albumin SerPl Elph-Mcnc: 3.5 g/dL (ref 2.9–4.4)
Albumin/Glob SerPl: 1.4 (ref 0.7–1.7)
Alpha 1: 0.3 g/dL (ref 0.0–0.4)
Alpha2 Glob SerPl Elph-Mcnc: 0.7 g/dL (ref 0.4–1.0)
B-Globulin SerPl Elph-Mcnc: 0.9 g/dL (ref 0.7–1.3)
Gamma Glob SerPl Elph-Mcnc: 0.8 g/dL (ref 0.4–1.8)
Globulin, Total: 2.6 g/dL (ref 2.2–3.9)
IgA: 10 mg/dL — ABNORMAL LOW (ref 64–422)
IgG (Immunoglobin G), Serum: 798 mg/dL (ref 586–1602)
IgM (Immunoglobulin M), Srm: 10 mg/dL — ABNORMAL LOW (ref 26–217)
M Protein SerPl Elph-Mcnc: 0.6 g/dL — ABNORMAL HIGH
Total Protein ELP: 6.1 g/dL (ref 6.0–8.5)

## 2024-01-20 DIAGNOSIS — Z85828 Personal history of other malignant neoplasm of skin: Secondary | ICD-10-CM | POA: Diagnosis not present

## 2024-01-20 DIAGNOSIS — L282 Other prurigo: Secondary | ICD-10-CM | POA: Diagnosis not present

## 2024-01-20 DIAGNOSIS — L720 Epidermal cyst: Secondary | ICD-10-CM | POA: Diagnosis not present

## 2024-01-20 DIAGNOSIS — L821 Other seborrheic keratosis: Secondary | ICD-10-CM | POA: Diagnosis not present

## 2024-01-20 DIAGNOSIS — L309 Dermatitis, unspecified: Secondary | ICD-10-CM | POA: Diagnosis not present

## 2024-01-27 ENCOUNTER — Inpatient Hospital Stay

## 2024-01-27 ENCOUNTER — Inpatient Hospital Stay (HOSPITAL_BASED_OUTPATIENT_CLINIC_OR_DEPARTMENT_OTHER): Admitting: Physician Assistant

## 2024-01-27 VITALS — BP 135/77 | HR 61 | Temp 97.3°F | Wt 125.7 lb

## 2024-01-27 DIAGNOSIS — Z5112 Encounter for antineoplastic immunotherapy: Secondary | ICD-10-CM | POA: Diagnosis not present

## 2024-01-27 DIAGNOSIS — C9000 Multiple myeloma not having achieved remission: Secondary | ICD-10-CM | POA: Diagnosis not present

## 2024-01-27 DIAGNOSIS — Z79899 Other long term (current) drug therapy: Secondary | ICD-10-CM | POA: Diagnosis not present

## 2024-01-27 LAB — CBC WITH DIFFERENTIAL (CANCER CENTER ONLY)
Abs Immature Granulocytes: 0.03 10*3/uL (ref 0.00–0.07)
Basophils Absolute: 0.1 10*3/uL (ref 0.0–0.1)
Basophils Relative: 1 %
Eosinophils Absolute: 0.2 10*3/uL (ref 0.0–0.5)
Eosinophils Relative: 3 %
HCT: 39.3 % (ref 36.0–46.0)
Hemoglobin: 13.2 g/dL (ref 12.0–15.0)
Immature Granulocytes: 0 %
Lymphocytes Relative: 23 %
Lymphs Abs: 2.1 10*3/uL (ref 0.7–4.0)
MCH: 32.1 pg (ref 26.0–34.0)
MCHC: 33.6 g/dL (ref 30.0–36.0)
MCV: 95.6 fL (ref 80.0–100.0)
Monocytes Absolute: 0.8 10*3/uL (ref 0.1–1.0)
Monocytes Relative: 9 %
Neutro Abs: 5.9 10*3/uL (ref 1.7–7.7)
Neutrophils Relative %: 64 %
Platelet Count: 391 10*3/uL (ref 150–400)
RBC: 4.11 MIL/uL (ref 3.87–5.11)
RDW: 12.8 % (ref 11.5–15.5)
WBC Count: 9.2 10*3/uL (ref 4.0–10.5)
nRBC: 0 % (ref 0.0–0.2)

## 2024-01-27 LAB — CMP (CANCER CENTER ONLY)
ALT: 15 U/L (ref 0–44)
AST: 21 U/L (ref 15–41)
Albumin: 4 g/dL (ref 3.5–5.0)
Alkaline Phosphatase: 54 U/L (ref 38–126)
Anion gap: 7 (ref 5–15)
BUN: 12 mg/dL (ref 8–23)
CO2: 28 mmol/L (ref 22–32)
Calcium: 9.2 mg/dL (ref 8.9–10.3)
Chloride: 104 mmol/L (ref 98–111)
Creatinine: 0.63 mg/dL (ref 0.44–1.00)
GFR, Estimated: >60 mL/min (ref >60)
Glucose, Bld: 92 mg/dL (ref 70–99)
Potassium: 4.1 mmol/L (ref 3.5–5.1)
Sodium: 139 mmol/L (ref 135–145)
Total Bilirubin: 0.5 mg/dL (ref 0.0–1.2)
Total Protein: 6.4 g/dL — ABNORMAL LOW (ref 6.5–8.1)

## 2024-01-27 MED ORDER — DEXTROSE 5 % IV SOLN
70.0000 mg/m2 | Freq: Once | INTRAVENOUS | Status: AC
  Administered 2024-01-27: 120 mg via INTRAVENOUS
  Filled 2024-01-27: qty 60

## 2024-01-27 MED ORDER — SODIUM CHLORIDE 0.9 % IV SOLN: Freq: Once | INTRAVENOUS | Status: AC

## 2024-01-27 MED ORDER — SODIUM CHLORIDE 0.9 % IV SOLN
40.0000 mg | Freq: Once | INTRAVENOUS | Status: AC
  Administered 2024-01-27: 40 mg via INTRAVENOUS
  Filled 2024-01-27: qty 4

## 2024-01-27 NOTE — Patient Instructions (Signed)
 [facility] - A DEPT OF [facility]  Discharge Instructions: Thank you for choosing [facility] to provide your oncology and hematology care.   If you have a lab appointment with the Cancer Center, please go directly to the Cancer Center and check in at the registration area.   Wear comfortable clothing and clothing appropriate for easy access to any Portacath or PICC line.   We strive to give you quality time with your provider. You may need to reschedule your appointment if you arrive late (15 or more minutes).  Arriving late affects you and other patients whose appointments are after yours.  Also, if you miss three or more appointments without notifying the office, you may be dismissed from the clinic at the provider's discretion.      For prescription refill requests, have your pharmacy contact our office and allow 72 hours for refills to be completed.    Today you received the following chemotherapy and/or immunotherapy agent: Carfilzomib (Kyprolis)      To help prevent nausea and vomiting after your treatment, we encourage you to take your nausea medication as directed.  BELOW ARE SYMPTOMS THAT SHOULD BE REPORTED IMMEDIATELY: *FEVER GREATER THAN 100.4 F (38 C) OR HIGHER *CHILLS OR SWEATING *NAUSEA AND VOMITING THAT IS NOT CONTROLLED WITH YOUR NAUSEA MEDICATION *UNUSUAL SHORTNESS OF BREATH *UNUSUAL BRUISING OR BLEEDING *URINARY PROBLEMS (pain or burning when urinating, or frequent urination) *BOWEL PROBLEMS (unusual diarrhea, constipation, pain near the anus) TENDERNESS IN MOUTH AND THROAT WITH OR WITHOUT PRESENCE OF ULCERS (sore throat, sores in mouth, or a toothache) UNUSUAL RASH, SWELLING OR PAIN  UNUSUAL VAGINAL DISCHARGE OR ITCHING   Items with * indicate a potential emergency and should be followed up as soon as possible or go to the Emergency Department if any problems should occur.  Please show the CHEMOTHERAPY ALERT CARD or  IMMUNOTHERAPY ALERT CARD at check-in to the Emergency Department and triage nurse.  Should you have questions after your visit or need to cancel or reschedule your appointment, please contact [facility] - [facility]  Dept: [phone]  and follow the prompts.  Office hours are 8:00 a.m. to 4:30 p.m. Monday - Friday. Please note that voicemails left after 4:00 p.m. may not be returned until the following business day.  We are closed weekends and major holidays. You have access to a nurse at all times for urgent questions. Please call the main number to the clinic Dept: [phone] and follow the prompts.   For any non-urgent questions, you may also contact your provider using MyChart. We now offer e-Visits for anyone 81 and older to request care online for non-urgent symptoms. For details visit [email or web address].   Also download the MyChart app! Go to the app store, search "MyChart", open the app, select [place], and log in with your MyChart username and password.

## 2024-01-27 NOTE — Progress Notes (Signed)
 [facility] Telephone:([phone]) [phone]   [phone]) [phone]  PROGRESS NOTE  Patient Care Team: Ronna Coho, MD as PCP - General (Family Medicine)  CHIEF COMPLAINTS/PURPOSE OF CONSULTATION:  IgG Lambda Multiple Myeloma  ONCOLOGIC HISTORY: 07/27/2018-09/2018: Received weekly velcade  plus dexamethasone . Therapy changed due to poor response.  09/2018: Started Revlimid  25 mg for 21 days on, 7 days off of a 28 day cycle with dexamethasone . Therapy was held after 2 cycles because of severe dermatological toxicity 12/2018: Pomalyst  1 mg daily for 21 days.  She completed 4 cycles of therapy and therapy was held due to occurrence of dermatological toxicity.  07/2019: Started Ninlaro  4 mg weekly with dexamethasone  20 mg weekly for 3 weeks on and 1 week off. Daratumumab  was addd in April 2021. Ninlaro  was discontinued in November 2021. 05/18/2020: Started monthly daratumumab  and transitioned to q 3 month schedule in April 2022.  11/06/2022: Darzalex  discontinued due to progression of disease. M protein 0.9 with worsening lytic lesions in the skeleton.  11/20/2022: Cycle 1 Day 1 of Kyprolis /Dex therapy.  12/19/2022: Cycle 2 Day 1 of Kyprolis /Dex therapy 01/15/2023: Cycle 3 Day 1 of Kyprolis /Dex therapy 02/19/2023: Cycle 4 Day 1 of Kyprolis /Dex therapy 03/18/2023: Cycle 5 Day 1 of Kyprolis /Dex therapy 04/25/2023: Cycle 6 Day 1 of Kyprolis /Dex therapy 05/28/2023: Cycle 7 Day 1 of Kyprolis /Dex therapy 06/11/2023: Cycle 8 Day 1 of Kyprolis /Dex therapy 07/08/2023 Cycle 9 Day 1 of Kyprolis /Dex therapy 09/11/2023 Cycle 10 Day 1 of Kyprolis /Dex therapy 10/09/2023: Cycle 11 Day 1 of Kyprolis /Dex therapy 11/05/2023: Cycle 12 Day 1 of Kyprolis /Dex therapy 12/02/2023: Cycle 13 Day 1 of Kyprolis /Dex therapy 01/17/2024: Cycle 14 Day 1 of Kyprolis /Dex therapy  HISTORY OF PRESENTING ILLNESS:  Nicole Keith 83 y.o. female returns for a follow up for IgG lambda multiple myeloma. She was last seen on  01/13/2024. She presents today for Cycle 14, Day 15of Kyprolis /Dex.   On exam today, [name] reports she has been well overall in the interim since her last visit. Her energy levels are fairly unchanged. She has days when she is more fatigued than others but she continues to complete her basic ADLs on her own. Her appetite and weight are stable. She denies nausea, vomiting or bowel habit changes. She denies easy bruising or signs of bleeding. She continues to have a diffuse, erythematous rash with treatment that is overall stable and improved with topical creams. She adds having some memory loss mainly in the afternoon. She tries to do all her bills in the morning to minimize any mistakes. Otherwise she is willing and able to continue on Kyprolis  therapy at this time.  A full 10 point ROS is otherwise negative.  MEDICAL HISTORY:  Past Medical History:  Diagnosis Date   Allergic rhinitis    Arthritis    Colitis, collagenous    Diverticular disease    severe sig tics/fixation 2012   Family hx of colon cancer    Fracture of femoral neck, right (HCC) 01/09/2013   GERD (gastroesophageal reflux disease)    exacerbation of reflux-like symptoms 04/2011   Hiatal hernia    Hyperlipidemia    muliplt myelom 2020   Osteoarthritis of right hip 01/10/2013    SURGICAL HISTORY: Past Surgical History:  Procedure Laterality Date   CHOLECYSTECTOMY     EYE SURGERY  feb 2016   cataract right eye   LEG SURGERY Left    TONSILLECTOMY     TOTAL HIP ARTHROPLASTY Right 01/10/2013   Procedure: TOTAL HIP ARTHROPLASTY;  Surgeon: Neville Barbone, MD;  Location: [facility] OR;  Service: Orthopedics;  Laterality: Right;    SOCIAL HISTORY: Social History   Socioeconomic History   Marital status: Married    Spouse name: Not on file   Number of children: 0   Years of education: Not on file   Highest education level: Some college, no degree  Occupational History   Occupation: retired  Tobacco Use   Smoking status:  Former    Current packs/day: 0.00    Types: Cigarettes    Quit date: 01/10/1972    Years since quitting: 52.0   Smokeless tobacco: Never   Tobacco comments:    quit 1973  Vaping Use   Vaping status: Never Used  Substance and Sexual Activity   Alcohol use: No   Drug use: No   Sexual activity: Not on file  Other Topics Concern   Not on file  Social History Narrative   Married, no children. Drinks 2 cups decaf coffee a day. Walks daily. Is active, plays golf, she and her husband go hiking frequently. 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   Social Drivers of Corporate investment banker Strain: Not on file  Food Insecurity: No Food Insecurity (01/17/2023)   Hunger Vital Sign    Worried About Running Out of Food in the Last Year: Never true    Ran Out of Food in the Last Year: Never true  Transportation Needs: No Transportation Needs (01/17/2023)   PRAPARE - Administrator, Civil Service (Medical): No    Lack of Transportation (Non-Medical): No  Physical Activity: Not on file  Stress: Not on file  Social Connections: Not on file  Intimate Partner Violence: Not At Risk (01/17/2023)   Humiliation, Afraid, Rape, and Kick questionnaire    Fear of Current or Ex-Partner: No    Emotionally Abused: No    Physically Abused: No    Sexually Abused: No    FAMILY HISTORY: Family History  Problem Relation Age of Onset   Congestive Heart Failure Mother    Arthritis-Osteo Mother    Colon cancer Mother    Heart attack Father    Rheum arthritis Father    Colon cancer Maternal Aunt    Colon cancer Maternal Aunt    Hypothyroidism Sister     ALLERGIES:  is allergic to kyprolis  [carfilzomib ], pomalyst  [pomalidomide ], revlimid  [lenalidomide ], betadine [povidone iodine], flagyl [metronidazole], fosamax [alendronate sodium], bactrim [sulfamethoxazole-trimethoprim], clindamycin/lincomycin, doxepin hcl, hydrocodone -acetaminophen , hydroxyzine, hydroxyzine hcl, tramadol hcl,  valium [diazepam], actonel [risedronate sodium], boniva [ibandronate], doxycycline, and penicillins.  MEDICATIONS:  Current Outpatient Medications  Medication Sig Dispense Refill   acetaminophen  (TYLENOL ) 500 MG tablet Take 1,000 mg by mouth See admin instructions. Take 1,000 mg by mouth after breakfast, at 3:30 PM, and at 8:30 PM     acyclovir  (ZOVIRAX ) 400 MG tablet Take 1 tablet (400 mg total) by mouth 2 (two) times daily. 60 tablet 3   albuterol  (VENTOLIN  HFA) 108 (90 Base) MCG/ACT inhaler SMARTSIG:1 Puff(s) By Mouth Every 4-6 Hours PRN     augmented betamethasone dipropionate (DIPROLENE-AF) 0.05 % cream SMARTSIG:sparingly Topical Twice Daily PRN     bismuth subsalicylate (PEPTO BISMOL) 262 MG/15ML suspension Take 30 mLs by mouth every 6 (six) hours as needed.     famotidine  (PEPCID ) 20 MG tablet Take 1 tablet (20 mg total) by mouth daily. 30 tablet 0   famotidine -calcium carbonate-magnesium hydroxide (PEPCID  COMPLETE) 10-800-165 MG chewable tablet Chew 1 tablet by mouth daily as  needed (for heartburn or indigestion).      guaiFENesin  (MUCINEX ) 600 MG 12 hr tablet Take 1 tablet (600 mg total) by mouth 2 (two) times daily. 40 tablet 0   hydrocortisone 2.5 % cream Apply topically 2 (two) times daily.     loperamide  (IMODIUM ) 2 MG capsule Take 1 capsule (2 mg total) by mouth 3 (three) times daily between meals as needed for diarrhea or loose stools. 30 capsule 1   triamcinolone  cream (KENALOG ) 0.1 % Apply 1 Application topically 2 (two) times daily. (Patient taking differently: Apply 1 Application topically 2 (two) times daily as needed (for allergic flares- affected areas).) 453.6 g 2   VASELINE PURE ULTRA WHITE ointment Apply 1 application  topically See admin instructions. Apply to the genitalia in the morning and at bedtime     No current facility-administered medications for this visit.   Facility-Administered Medications Ordered in Other Visits  Medication Dose Route Frequency Provider  Last Rate Last Admin   carfilzomib  (KYPROLIS ) 120 mg in dextrose  5 % 100 mL chemo infusion  70 mg/m2 (Treatment Plan Recorded) Intravenous Once Rogerio Clay IV, MD 320 mL/hr at 01/27/24 1349 120 mg at 01/27/24 1349    REVIEW OF SYSTEMS:   Constitutional: ( - ) fevers, ( - )  chills , ( - ) night sweats Eyes: ( - ) blurriness of vision, ( - ) double vision, ( - ) watery eyes Ears, nose, mouth, throat, and face: ( - ) mucositis, ( - ) sore throat Respiratory: ( - ) cough, ( - ) dyspnea, ( - ) wheezes Cardiovascular: ( - ) palpitation, ( - ) chest discomfort, ( - ) lower extremity swelling Gastrointestinal:  ( - ) nausea, ( - ) heartburn, ( - ) change in bowel habits Skin: ( +) abnormal skin rashes Lymphatics: ( - ) new lymphadenopathy, ( - ) easy bruising Neurological: ( - ) numbness, ( - ) tingling, ( - ) new weaknesses Behavioral/Psych: ( - ) mood change, ( - ) new changes  All other systems were reviewed with the patient and are negative.  PHYSICAL EXAMINATION: ECOG PERFORMANCE STATUS: 1 - Symptomatic but completely ambulatory  Vitals:   01/27/24 1108  BP: 135/77  Pulse: 61  Temp: (!) 97.3 F (36.3 C)  SpO2: 97%      Filed Weights   01/27/24 1108  Weight: 125 lb 11.2 oz (57 kg)      GENERAL: well appearing female in NAD  SKIN: skin color, texture, turgor are normal. Diffuse, flat erythematous rash.  EYES: conjunctiva are pink and non-injected, sclera clear LUNGS: clear to auscultation and percussion with normal breathing effort HEART: regular rate & rhythm and no murmurs and no lower extremity edema Musculoskeletal: no cyanosis of digits and no clubbing  PSYCH: alert & oriented x 3, fluent speech NEURO: no focal motor/sensory deficits  LABORATORY DATA:  I have reviewed the data as listed    Latest Ref Rng & Units 01/27/2024   10:14 AM 01/13/2024    1:06 PM 12/30/2023    1:04 PM  CBC  WBC 4.0 - 10.5 K/uL 9.2  10.6  7.9   Hemoglobin 12.0 - 15.0 g/dL 21.3  08.6   57.8   Hematocrit 36.0 - 46.0 % 39.3  38.9  37.4   Platelets 150 - 400 K/uL 391  443  408        Latest Ref Rng & Units 01/27/2024   10:14 AM 01/13/2024    1:06 PM  12/30/2023    1:04 PM  CMP  Glucose 70 - 99 mg/dL 92  308  93   BUN 8 - 23 mg/dL 12  11  10    Creatinine 0.44 - 1.00 mg/dL 6.57  8.46  9.62   Sodium 135 - 145 mmol/L 139  138  139   Potassium 3.5 - 5.1 mmol/L 4.1  4.3  4.2   Chloride 98 - 111 mmol/L 104  105  103   CO2 22 - 32 mmol/L 28  27  30    Calcium 8.9 - 10.3 mg/dL 9.2  9.6  9.4   Total Protein 6.5 - 8.1 g/dL 6.4  6.4  5.9   Total Bilirubin 0.0 - 1.2 mg/dL 0.5  0.5  0.5   Alkaline Phos 38 - 126 U/L 54  50  55   AST 15 - 41 U/L 21  19  16    ALT 0 - 44 U/L 15  12  10      ASSESSMENT & PLAN Nicole Keith is a 83 y.o.female who presents to the clinic for follow up for multiple myeloma.  #IgG Lambda Multiple Myeloma: --Initially presented in April 2016 with M spike of 3.2 g/dL and IgG level of 9528. Underwent bone marrow biopsy from 11/20/2014 show 29% plasma cells. 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  --In December 2019 with rising M spike of 5.9 g/dL and worsening anemia, with Hgb 8.7, she met criteria for transformation of active multiple myeloma.  --From 07/27/2018-09/2018, received weekly velcade  plus dexamethasone . Therapy changed due to poor response.  --In 09/2018, started Revlimid  25 mg for 21 days on, 7 days off of a 28 day cycle with dexamethasone . Therapy was held after 2 cycles because of severe dermatological toxicity --In 12/2018, switched to Pomalyst  1 mg daily for 21 days.  She completed 4 cycles of therapy and therapy was held due to occurrence of dermatological toxicity.  --In 07/2019, switch to Ninlaro  4 mg weekly with dexamethasone  20 mg weekly for 3 weeks on and 1 week off. Daratumumab  was addd in April 2021. Ninlaro  was discontinued in November 2021. --On 05/18/2020, started monthly daratumumab  and transitioned to q 3 month  schedule in April 2022.  --On 11/20/2022, switch to Kyprolis /Dex due to rising M-protein and bone survey that showed worsening lytic lesions. --Switched to biweekly infusion of Kyprolis /Dex per patient request  as she wants to prioritize her quality of life PLAN:   --Due for cycle 14, Day 15 today. --Labs today show white blood cell count 9.2, hemoglobin 13.2, MCV 95.6, platelets 391, Creatinine and LFTs in range.  --Most recent myeloma labs from 01/13/2024 showed M protein to 0.6, need to monitor closely.  --RTC in 2 weeks for continuation of Kyprolis /Dex with continued biweekly treatments.   #Diffuse erythematous rash-stable --Managed by dermatology.  Patient prescribed 3 different steroid creams with instructions on how to use them which areas to apply. -- nystatin  powder for groin area.   #Supportive Care -- port not required but can be placed if requested.  -- zofran  8mg  q8H PRN and compazine  10mg  PO q6H for nausea -- acyclovir  400mg  PO BID for VCZ prophylaxis -- no pain medication required at this time.  -- discussed role for zometa  therapy in the setting her lytic lesions. Explained zometa  is given q 12 weeks and requires a dental clearance.  After discussing this with the patient further today she knows she would like to hold on this for the time being as she is afraid  of the side effects.  Strongly emphasized that she should reconsider as this will be helpful with her lytic bone lesions.  No orders of the defined types were placed in this encounter.  All questions were answered. The patient knows to call the clinic with any problems, questions or concerns.  I have spent a total of 30 minutes minutes of face-to-face and non-face-to-face time, preparing to see the patient,  performing a medically appropriate examination, counseling and educating the patient, ordering medications/tests/procedures,documenting clinical information in the electronic health record, and care coordination.    Wyline Hearing PA-C Dept of Hematology and Oncology [facility] Phone: [phone]

## 2024-02-10 ENCOUNTER — Inpatient Hospital Stay: Attending: Physician Assistant

## 2024-02-10 ENCOUNTER — Inpatient Hospital Stay

## 2024-02-10 ENCOUNTER — Inpatient Hospital Stay: Admitting: Hematology and Oncology

## 2024-02-10 VITALS — BP 138/72 | HR 61 | Temp 98.5°F | Resp 16 | Wt 126.5 lb

## 2024-02-10 DIAGNOSIS — C9000 Multiple myeloma not having achieved remission: Secondary | ICD-10-CM

## 2024-02-10 DIAGNOSIS — Z79899 Other long term (current) drug therapy: Secondary | ICD-10-CM | POA: Insufficient documentation

## 2024-02-10 DIAGNOSIS — Z5112 Encounter for antineoplastic immunotherapy: Secondary | ICD-10-CM | POA: Insufficient documentation

## 2024-02-10 LAB — CBC WITH DIFFERENTIAL (CANCER CENTER ONLY)
Abs Immature Granulocytes: 0.05 10*3/uL (ref 0.00–0.07)
Basophils Absolute: 0.1 10*3/uL (ref 0.0–0.1)
Basophils Relative: 1 %
Eosinophils Absolute: 0.2 10*3/uL (ref 0.0–0.5)
Eosinophils Relative: 2 %
HCT: 40.3 % (ref 36.0–46.0)
Hemoglobin: 13.2 g/dL (ref 12.0–15.0)
Immature Granulocytes: 1 %
Lymphocytes Relative: 20 %
Lymphs Abs: 1.7 10*3/uL (ref 0.7–4.0)
MCH: 31.6 pg (ref 26.0–34.0)
MCHC: 32.8 g/dL (ref 30.0–36.0)
MCV: 96.4 fL (ref 80.0–100.0)
Monocytes Absolute: 0.6 10*3/uL (ref 0.1–1.0)
Monocytes Relative: 8 %
Neutro Abs: 6 10*3/uL (ref 1.7–7.7)
Neutrophils Relative %: 68 %
Platelet Count: 460 10*3/uL — ABNORMAL HIGH (ref 150–400)
RBC: 4.18 MIL/uL (ref 3.87–5.11)
RDW: 12.8 % (ref 11.5–15.5)
WBC Count: 8.6 10*3/uL (ref 4.0–10.5)
nRBC: 0 % (ref 0.0–0.2)

## 2024-02-10 LAB — CMP (CANCER CENTER ONLY)
ALT: 16 U/L (ref 0–44)
AST: 19 U/L (ref 15–41)
Albumin: 3.7 g/dL (ref 3.5–5.0)
Alkaline Phosphatase: 48 U/L (ref 38–126)
Anion gap: 6 (ref 5–15)
BUN: 9 mg/dL (ref 8–23)
CO2: 28 mmol/L (ref 22–32)
Calcium: 9.1 mg/dL (ref 8.9–10.3)
Chloride: 105 mmol/L (ref 98–111)
Creatinine: 0.66 mg/dL (ref 0.44–1.00)
GFR, Estimated: >60 mL/min (ref >60)
Glucose, Bld: 121 mg/dL — ABNORMAL HIGH (ref 70–99)
Potassium: 4.2 mmol/L (ref 3.5–5.1)
Sodium: 139 mmol/L (ref 135–145)
Total Bilirubin: 0.5 mg/dL (ref 0.0–1.2)
Total Protein: 6 g/dL — ABNORMAL LOW (ref 6.5–8.1)

## 2024-02-10 MED ORDER — DEXTROSE 5 % IV SOLN
70.0000 mg/m2 | Freq: Once | INTRAVENOUS | Status: AC
  Administered 2024-02-10: 120 mg via INTRAVENOUS
  Filled 2024-02-10: qty 60

## 2024-02-10 MED ORDER — SODIUM CHLORIDE 0.9 % IV SOLN
40.0000 mg | Freq: Once | INTRAVENOUS | Status: AC
  Administered 2024-02-10: 40 mg via INTRAVENOUS
  Filled 2024-02-10: qty 4

## 2024-02-10 MED ORDER — SODIUM CHLORIDE 0.9 % IV SOLN: Freq: Once | INTRAVENOUS | Status: AC

## 2024-02-10 NOTE — Patient Instructions (Signed)
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
## 2024-02-11 LAB — KAPPA/LAMBDA LIGHT CHAINS
Kappa free light chain: 1.6 mg/L — ABNORMAL LOW (ref 3.3–19.4)
Kappa, lambda light chain ratio: 0.07 — ABNORMAL LOW (ref 0.26–1.65)
Lambda free light chains: 23.7 mg/L (ref 5.7–26.3)

## 2024-02-14 ENCOUNTER — Encounter: Payer: Self-pay | Admitting: Hematology and Oncology

## 2024-02-14 LAB — MULTIPLE MYELOMA PANEL, SERUM
Albumin SerPl Elph-Mcnc: 3.3 g/dL (ref 2.9–4.4)
Albumin/Glob SerPl: 1.4 (ref 0.7–1.7)
Alpha 1: 0.2 g/dL (ref 0.0–0.4)
Alpha2 Glob SerPl Elph-Mcnc: 0.5 g/dL (ref 0.4–1.0)
B-Globulin SerPl Elph-Mcnc: 0.8 g/dL (ref 0.7–1.3)
Gamma Glob SerPl Elph-Mcnc: 0.9 g/dL (ref 0.4–1.8)
Globulin, Total: 2.4 g/dL (ref 2.2–3.9)
IgA: 10 mg/dL — ABNORMAL LOW (ref 64–422)
IgG (Immunoglobin G), Serum: 896 mg/dL (ref 586–1602)
IgM (Immunoglobulin M), Srm: 9 mg/dL — ABNORMAL LOW (ref 26–217)
M Protein SerPl Elph-Mcnc: 0.6 g/dL — ABNORMAL HIGH
Total Protein ELP: 5.7 g/dL — ABNORMAL LOW (ref 6.0–8.5)

## 2024-02-15 ENCOUNTER — Other Ambulatory Visit: Payer: Self-pay

## 2024-02-19 ENCOUNTER — Other Ambulatory Visit: Payer: Self-pay

## 2024-02-24 ENCOUNTER — Inpatient Hospital Stay

## 2024-02-24 ENCOUNTER — Inpatient Hospital Stay: Admitting: Physician Assistant

## 2024-02-24 VITALS — BP 136/89 | HR 71 | Temp 97.4°F | Resp 18 | Ht 66.0 in | Wt 126.1 lb

## 2024-02-24 DIAGNOSIS — Z79899 Other long term (current) drug therapy: Secondary | ICD-10-CM | POA: Diagnosis not present

## 2024-02-24 DIAGNOSIS — C9000 Multiple myeloma not having achieved remission: Secondary | ICD-10-CM

## 2024-02-24 DIAGNOSIS — Z5112 Encounter for antineoplastic immunotherapy: Secondary | ICD-10-CM | POA: Diagnosis not present

## 2024-02-24 LAB — CBC WITH DIFFERENTIAL (CANCER CENTER ONLY)
Abs Immature Granulocytes: 0.03 K/uL (ref 0.00–0.07)
Basophils Absolute: 0.1 K/uL (ref 0.0–0.1)
Basophils Relative: 1 %
Eosinophils Absolute: 0.2 K/uL (ref 0.0–0.5)
Eosinophils Relative: 2 %
HCT: 40.3 % (ref 36.0–46.0)
Hemoglobin: 13.5 g/dL (ref 12.0–15.0)
Immature Granulocytes: 0 %
Lymphocytes Relative: 18 %
Lymphs Abs: 1.6 K/uL (ref 0.7–4.0)
MCH: 31.7 pg (ref 26.0–34.0)
MCHC: 33.5 g/dL (ref 30.0–36.0)
MCV: 94.6 fL (ref 80.0–100.0)
Monocytes Absolute: 0.7 K/uL (ref 0.1–1.0)
Monocytes Relative: 8 %
Neutro Abs: 6.1 K/uL (ref 1.7–7.7)
Neutrophils Relative %: 71 %
Platelet Count: 422 K/uL — ABNORMAL HIGH (ref 150–400)
RBC: 4.26 MIL/uL (ref 3.87–5.11)
RDW: 12.9 % (ref 11.5–15.5)
WBC Count: 8.6 K/uL (ref 4.0–10.5)
nRBC: 0 % (ref 0.0–0.2)

## 2024-02-24 LAB — CMP (CANCER CENTER ONLY)
ALT: 15 U/L (ref 0–44)
AST: 19 U/L (ref 15–41)
Albumin: 3.7 g/dL (ref 3.5–5.0)
Alkaline Phosphatase: 45 U/L (ref 38–126)
Anion gap: 5 (ref 5–15)
BUN: 10 mg/dL (ref 8–23)
CO2: 29 mmol/L (ref 22–32)
Calcium: 9.2 mg/dL (ref 8.9–10.3)
Chloride: 105 mmol/L (ref 98–111)
Creatinine: 0.68 mg/dL (ref 0.44–1.00)
GFR, Estimated: >60 mL/min (ref >60)
Glucose, Bld: 142 mg/dL — ABNORMAL HIGH (ref 70–99)
Potassium: 4 mmol/L (ref 3.5–5.1)
Sodium: 139 mmol/L (ref 135–145)
Total Bilirubin: 0.5 mg/dL (ref 0.0–1.2)
Total Protein: 6.1 g/dL — ABNORMAL LOW (ref 6.5–8.1)

## 2024-02-24 MED ORDER — SODIUM CHLORIDE 0.9 % IV SOLN
Freq: Once | INTRAVENOUS | Status: DC
Start: 2024-02-24 — End: 2024-02-24

## 2024-02-24 MED ORDER — DEXTROSE 5 % IV SOLN
70.0000 mg/m2 | Freq: Once | INTRAVENOUS | Status: AC
  Administered 2024-02-24: 120 mg via INTRAVENOUS
  Filled 2024-02-24: qty 60

## 2024-02-24 MED ORDER — SODIUM CHLORIDE 0.9 % IV SOLN
Freq: Once | INTRAVENOUS | Status: AC
Start: 2024-02-24 — End: 2024-02-24

## 2024-02-24 MED ORDER — SODIUM CHLORIDE 0.9 % IV SOLN
40.0000 mg | Freq: Once | INTRAVENOUS | Status: AC
  Administered 2024-02-24: 40 mg via INTRAVENOUS
  Filled 2024-02-24: qty 4

## 2024-02-24 NOTE — Progress Notes (Signed)
 [facility] [phone]   [phone]  PROGRESS NOTE  Patient Care Team: Kip Righter, MD as PCP - General (Family Medicine)  CHIEF COMPLAINTS/PURPOSE OF CONSULTATION:  IgG Lambda Multiple Myeloma  ONCOLOGIC HISTORY: 07/27/2018-09/2018: Received weekly velcade  plus dexamethasone . Therapy changed due to poor response.  09/2018: Started Revlimid  25 mg for 21 days on, 7 days off of a 28 day cycle with dexamethasone . Therapy was held after 2 cycles because of severe dermatological toxicity 12/2018: Pomalyst  1 mg daily for 21 days.  She completed 4 cycles of therapy and therapy was held due to occurrence of dermatological toxicity.  07/2019: Started Ninlaro  4 mg weekly with dexamethasone  20 mg weekly for 3 weeks on and 1 week off. Daratumumab  was addd in April 2021. Ninlaro  was discontinued in November 2021. 05/18/2020: Started monthly daratumumab  and transitioned to q 3 month schedule in April 2022.  11/06/2022: Darzalex  discontinued due to progression of disease. M protein 0.9 with worsening lytic lesions in the skeleton.  11/20/2022: Cycle 1 Day 1 of Kyprolis /Dex therapy.  12/19/2022: Cycle 2 Day 1 of Kyprolis /Dex therapy 01/15/2023: Cycle 3 Day 1 of Kyprolis /Dex therapy 02/19/2023: Cycle 4 Day 1 of Kyprolis /Dex therapy 03/18/2023: Cycle 5 Day 1 of Kyprolis /Dex therapy 04/25/2023: Cycle 6 Day 1 of Kyprolis /Dex therapy 05/28/2023: Cycle 7 Day 1 of Kyprolis /Dex therapy 06/11/2023: Cycle 8 Day 1 of Kyprolis /Dex therapy 07/08/2023 Cycle 9 Day 1 of Kyprolis /Dex therapy 09/11/2023 Cycle 10 Day 1 of Kyprolis /Dex therapy 10/09/2023: Cycle 11 Day 1 of Kyprolis /Dex therapy 11/05/2023: Cycle 12 Day 1 of Kyprolis /Dex therapy 12/02/2023: Cycle 13 Day 1 of Kyprolis /Dex therapy 01/17/2024: Cycle 14 Day 1 of Kyprolis /Dex therapy 02/10/2024: Cycle 15 Day 1 of Kyprolis /Dex therapy  HISTORY OF PRESENTING ILLNESS:  Nicole Keith 83 y.o. female returns for a follow up for IgG  lambda multiple myeloma. She was last seen on 02/10/2024. She presents today for Cycle 15, Day 15 of Kyprolis /Dex.   On exam today, Nicole Keith reports she has been well overall in the interim since her last visit. Her energy and appetite are fairly stable. She denies nausea, vomiting or bowel habit changes. She denies easy bruising or signs of bleeding. She continues to have a diffuse, erythematous rash with treatment that is overall stable and improved with topical creams. She reports having pain around the right rib cage area that she contributes to when she is not wearing her back brace and has bad posture. Overall, she is willing and able to continue on Kyprolis  therapy at this time.  A full 10 point ROS is otherwise negative.  MEDICAL HISTORY:  Past Medical History:  Diagnosis Date   Allergic rhinitis    Arthritis    Colitis, collagenous    Diverticular disease    severe sig tics/fixation 2012   Family hx of colon cancer    Fracture of femoral neck, right (HCC) 01/09/2013   GERD (gastroesophageal reflux disease)    exacerbation of reflux-like symptoms 04/2011   Hiatal hernia    Hyperlipidemia    muliplt myelom 2020   Osteoarthritis of right hip 01/10/2013    SURGICAL HISTORY: Past Surgical History:  Procedure Laterality Date   CHOLECYSTECTOMY     EYE SURGERY  feb 2016   cataract right eye   LEG SURGERY Left    TONSILLECTOMY     TOTAL HIP ARTHROPLASTY Right 01/10/2013   Procedure: TOTAL HIP ARTHROPLASTY;  Surgeon: Fonda SHAUNNA Olmsted, MD;  Location: [facility] OR;  Service: Orthopedics;  Laterality: Right;  SOCIAL HISTORY: Social History   Socioeconomic History   Marital status: Married    Spouse name: Not on file   Number of children: 0   Years of education: Not on file   Highest education level: Some college, no degree  Occupational History   Occupation: retired  Tobacco Use   Smoking status: Former    Current packs/day: 0.00    Types: Cigarettes    Quit date: 01/10/1972     Years since quitting: 52.1   Smokeless tobacco: Never   Tobacco comments:    quit 1973  Vaping Use   Vaping status: Never Used  Substance and Sexual Activity   Alcohol use: No   Drug use: No   Sexual activity: Not on file  Other Topics Concern   Not on file  Social History Narrative   Married, no children. Drinks 2 cups decaf coffee a day. Walks daily. Is active, plays golf, she and her husband go hiking frequently. 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   Social Drivers of Corporate investment banker Strain: Not on file  Food Insecurity: No Food Insecurity (01/17/2023)   Hunger Vital Sign    Worried About Running Out of Food in the Last Year: Never true    Ran Out of Food in the Last Year: Never true  Transportation Needs: No Transportation Needs (01/17/2023)   PRAPARE - Administrator, Civil Service (Medical): No    Lack of Transportation (Non-Medical): No  Physical Activity: Not on file  Stress: Not on file  Social Connections: Not on file  Intimate Partner Violence: Not At Risk (01/17/2023)   Humiliation, Afraid, Rape, and Kick questionnaire    Fear of Current or Ex-Partner: No    Emotionally Abused: No    Physically Abused: No    Sexually Abused: No    FAMILY HISTORY: Family History  Problem Relation Age of Onset   Congestive Heart Failure Mother    Arthritis-Osteo Mother    Colon cancer Mother    Heart attack Father    Rheum arthritis Father    Colon cancer Maternal Aunt    Colon cancer Maternal Aunt    Hypothyroidism Sister     ALLERGIES:  is allergic to kyprolis  [carfilzomib ], pomalyst  [pomalidomide ], revlimid  [lenalidomide ], betadine [povidone iodine], flagyl [metronidazole], fosamax [alendronate sodium], bactrim [sulfamethoxazole-trimethoprim], clindamycin/lincomycin, doxepin hcl, hydrocodone -acetaminophen , hydroxyzine, hydroxyzine hcl, tramadol hcl, valium [diazepam], actonel [risedronate sodium], boniva [ibandronate], doxycycline,  and penicillins.  MEDICATIONS:  Current Outpatient Medications  Medication Sig Dispense Refill   acetaminophen  (TYLENOL ) 500 MG tablet Take 1,000 mg by mouth See admin instructions. Take 1,000 mg by mouth after breakfast, at 3:30 PM, and at 8:30 PM     acyclovir  (ZOVIRAX ) 400 MG tablet Take 1 tablet (400 mg total) by mouth 2 (two) times daily. 60 tablet 3   albuterol  (VENTOLIN  HFA) 108 (90 Base) MCG/ACT inhaler SMARTSIG:1 Puff(s) By Mouth Every 4-6 Hours PRN     augmented betamethasone dipropionate (DIPROLENE-AF) 0.05 % cream SMARTSIG:sparingly Topical Twice Daily PRN     bismuth subsalicylate (PEPTO BISMOL) 262 MG/15ML suspension Take 30 mLs by mouth every 6 (six) hours as needed.     famotidine  (PEPCID ) 20 MG tablet Take 1 tablet (20 mg total) by mouth daily. 30 tablet 0   famotidine -calcium carbonate-magnesium hydroxide (PEPCID  COMPLETE) 10-800-165 MG chewable tablet Chew 1 tablet by mouth daily as needed (for heartburn or indigestion).      guaiFENesin  (MUCINEX ) 600 MG 12 hr tablet Take  1 tablet (600 mg total) by mouth 2 (two) times daily. 40 tablet 0   hydrocortisone 2.5 % cream Apply topically 2 (two) times daily.     loperamide  (IMODIUM ) 2 MG capsule Take 1 capsule (2 mg total) by mouth 3 (three) times daily between meals as needed for diarrhea or loose stools. 30 capsule 1   triamcinolone  cream (KENALOG ) 0.1 % Apply 1 Application topically 2 (two) times daily. (Patient taking differently: Apply 1 Application topically 2 (two) times daily as needed (for allergic flares- affected areas).) 453.6 g 2   VASELINE PURE ULTRA WHITE ointment Apply 1 application  topically See admin instructions. Apply to the genitalia in the morning and at bedtime     No current facility-administered medications for this visit.    REVIEW OF SYSTEMS:   Constitutional: ( - ) fevers, ( - )  chills , ( - ) night sweats Eyes: ( - ) blurriness of vision, ( - ) double vision, ( - ) watery eyes Ears, nose, mouth,  throat, and face: ( - ) mucositis, ( - ) sore throat Respiratory: ( - ) cough, ( - ) dyspnea, ( - ) wheezes Cardiovascular: ( - ) palpitation, ( - ) chest discomfort, ( - ) lower extremity swelling Gastrointestinal:  ( - ) nausea, ( - ) heartburn, ( - ) change in bowel habits Skin: ( +) abnormal skin rashes Lymphatics: ( - ) new lymphadenopathy, ( - ) easy bruising Neurological: ( - ) numbness, ( - ) tingling, ( - ) new weaknesses Behavioral/Psych: ( - ) mood change, ( - ) new changes  All other systems were reviewed with the patient and are negative.  PHYSICAL EXAMINATION: ECOG PERFORMANCE STATUS: 1 - Symptomatic but completely ambulatory  Vitals:   02/24/24 1006  BP: 136/89  Pulse: 71  Resp: 18  Temp: (!) 97.4 F (36.3 C)  SpO2: 98%      Filed Weights   02/24/24 1006  Weight: 126 lb 1.6 oz (57.2 kg)      GENERAL: well appearing female in NAD  SKIN: skin color, texture, turgor are normal. Diffuse, flat erythematous rash.  EYES: conjunctiva are pink and non-injected, sclera clear LUNGS: clear to auscultation and percussion with normal breathing effort HEART: regular rate & rhythm and no murmurs and no lower extremity edema Musculoskeletal: no cyanosis of digits and no clubbing  PSYCH: alert & oriented x 3, fluent speech NEURO: no focal motor/sensory deficits  LABORATORY DATA:  I have reviewed the data as listed    Latest Ref Rng & Units 02/24/2024    9:28 AM 02/10/2024    8:51 AM 01/27/2024   10:14 AM  CBC  WBC 4.0 - 10.5 K/uL 8.6  8.6  9.2   Hemoglobin 12.0 - 15.0 g/dL 86.4  86.7  86.7   Hematocrit 36.0 - 46.0 % 40.3  40.3  39.3   Platelets 150 - 400 K/uL 422  460  391        Latest Ref Rng & Units 02/10/2024    8:51 AM 01/27/2024   10:14 AM 01/13/2024    1:06 PM  CMP  Glucose 70 - 99 mg/dL 878  92  898   BUN 8 - 23 mg/dL 9  12  11    Creatinine 0.44 - 1.00 mg/dL 9.33  9.36  9.36   Sodium 135 - 145 mmol/L 139  139  138   Potassium 3.5 - 5.1 mmol/L 4.2  4.1  4.3    Chloride  98 - 111 mmol/L 105  104  105   CO2 22 - 32 mmol/L 28  28  27    Calcium 8.9 - 10.3 mg/dL 9.1  9.2  9.6   Total Protein 6.5 - 8.1 g/dL 6.0  6.4  6.4   Total Bilirubin 0.0 - 1.2 mg/dL 0.5  0.5  0.5   Alkaline Phos 38 - 126 U/L 48  54  50   AST 15 - 41 U/L 19  21  19    ALT 0 - 44 U/L 16  15  12      ASSESSMENT & PLAN ANDRIEA HASEGAWA is a 83 y.o.female who presents to the clinic for follow up for multiple myeloma.  #IgG Lambda Multiple Myeloma: --Initially presented in April 2016 with M spike of 3.2 g/dL and IgG level of 5314. Underwent bone marrow biopsy from 11/20/2014 show 29% plasma cells. 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  --In December 2019 with rising M spike of 5.9 g/dL and worsening anemia, with Hgb 8.7, she met criteria for transformation of active multiple myeloma.  --From 07/27/2018-09/2018, received weekly velcade  plus dexamethasone . Therapy changed due to poor response.  --In 09/2018, started Revlimid  25 mg for 21 days on, 7 days off of a 28 day cycle with dexamethasone . Therapy was held after 2 cycles because of severe dermatological toxicity --In 12/2018, switched to Pomalyst  1 mg daily for 21 days.  She completed 4 cycles of therapy and therapy was held due to occurrence of dermatological toxicity.  --In 07/2019, switch to Ninlaro  4 mg weekly with dexamethasone  20 mg weekly for 3 weeks on and 1 week off. Daratumumab  was addd in April 2021. Ninlaro  was discontinued in November 2021. --On 05/18/2020, started monthly daratumumab  and transitioned to q 3 month schedule in April 2022.  --On 11/20/2022, switch to Kyprolis /Dex due to rising M-protein and bone survey that showed worsening lytic lesions. --Switched to biweekly infusion of Kyprolis /Dex per patient request  as she wants to prioritize her quality of life PLAN:   --Due for cycle 15, Day 15 today. --Labs today show white blood cell count 8.6, hemoglobin 13.5, MCV 94.6, platelets 422,  Creatinine and LFTs in range.  --Most recent myeloma labs from 02/10/2024 showed M protein to 0.6, kappa light chain 1.6, lambda light chain 23.7, ratio 0.07.   --RTC in 2 weeks for continuation of Kyprolis /Dex with continued biweekly treatments.   #Diffuse erythematous rash-stable --Managed by dermatology.  Patient prescribed 3 different steroid creams with instructions on how to use them which areas to apply. -- nystatin  powder for groin area.   #Supportive Care -- port not required but can be placed if requested.  -- zofran  8mg  q8H PRN and compazine  10mg  PO q6H for nausea -- acyclovir  400mg  PO BID for VCZ prophylaxis -- no pain medication required at this time.  -- discussed role for zometa  therapy in the setting her lytic lesions. Explained zometa  is given q 12 weeks and requires a dental clearance.  After discussing this with the patient further today she knows she would like to hold on this for the time being as she is afraid of the side effects.  Strongly emphasized that she should reconsider as this will be helpful with her lytic bone lesions.  No orders of the defined types were placed in this encounter.  All questions were answered. The patient knows to call the clinic with any problems, questions or concerns.  I have spent a total of 30 minutes minutes of face-to-face  and non-face-to-face time, preparing to see the patient,  performing a medically appropriate examination, counseling and educating the patient, ordering medications/tests/procedures,documenting clinical information in the electronic health record, and care coordination.   Johnston Police PA-C Dept of Hematology and Oncology [facility] Phone: [phone]

## 2024-02-24 NOTE — Patient Instructions (Signed)

## 2024-03-10 ENCOUNTER — Inpatient Hospital Stay

## 2024-03-10 ENCOUNTER — Inpatient Hospital Stay: Admitting: Hematology and Oncology

## 2024-03-10 VITALS — BP 155/85 | HR 62 | Temp 98.1°F | Resp 13 | Wt 125.9 lb

## 2024-03-10 DIAGNOSIS — Z5112 Encounter for antineoplastic immunotherapy: Secondary | ICD-10-CM | POA: Diagnosis not present

## 2024-03-10 DIAGNOSIS — C9000 Multiple myeloma not having achieved remission: Secondary | ICD-10-CM

## 2024-03-10 DIAGNOSIS — Z79899 Other long term (current) drug therapy: Secondary | ICD-10-CM | POA: Diagnosis not present

## 2024-03-10 LAB — CMP (CANCER CENTER ONLY)
ALT: 12 U/L (ref 0–44)
AST: 19 U/L (ref 15–41)
Albumin: 3.8 g/dL (ref 3.5–5.0)
Alkaline Phosphatase: 45 U/L (ref 38–126)
Anion gap: 6 (ref 5–15)
BUN: 12 mg/dL (ref 8–23)
CO2: 28 mmol/L (ref 22–32)
Calcium: 8.8 mg/dL — ABNORMAL LOW (ref 8.9–10.3)
Chloride: 104 mmol/L (ref 98–111)
Creatinine: 0.61 mg/dL (ref 0.44–1.00)
GFR, Estimated: >60 mL/min (ref >60)
Glucose, Bld: 108 mg/dL — ABNORMAL HIGH (ref 70–99)
Potassium: 4.2 mmol/L (ref 3.5–5.1)
Sodium: 138 mmol/L (ref 135–145)
Total Bilirubin: 0.7 mg/dL (ref 0.0–1.2)
Total Protein: 6.3 g/dL — ABNORMAL LOW (ref 6.5–8.1)

## 2024-03-10 LAB — CBC WITH DIFFERENTIAL (CANCER CENTER ONLY)
Abs Immature Granulocytes: 0.03 K/uL (ref 0.00–0.07)
Basophils Absolute: 0.1 K/uL (ref 0.0–0.1)
Basophils Relative: 1 %
Eosinophils Absolute: 0.2 K/uL (ref 0.0–0.5)
Eosinophils Relative: 2 %
HCT: 39.2 % (ref 36.0–46.0)
Hemoglobin: 13.2 g/dL (ref 12.0–15.0)
Immature Granulocytes: 0 %
Lymphocytes Relative: 19 %
Lymphs Abs: 1.6 K/uL (ref 0.7–4.0)
MCH: 31.7 pg (ref 26.0–34.0)
MCHC: 33.7 g/dL (ref 30.0–36.0)
MCV: 94.2 fL (ref 80.0–100.0)
Monocytes Absolute: 0.7 K/uL (ref 0.1–1.0)
Monocytes Relative: 8 %
Neutro Abs: 5.8 K/uL (ref 1.7–7.7)
Neutrophils Relative %: 70 %
Platelet Count: 442 K/uL — ABNORMAL HIGH (ref 150–400)
RBC: 4.16 MIL/uL (ref 3.87–5.11)
RDW: 13.2 % (ref 11.5–15.5)
WBC Count: 8.3 K/uL (ref 4.0–10.5)
nRBC: 0 % (ref 0.0–0.2)

## 2024-03-10 MED ORDER — SODIUM CHLORIDE 0.9 % IV SOLN
Freq: Once | INTRAVENOUS | Status: AC
Start: 2024-03-10 — End: 2024-03-10

## 2024-03-10 MED ORDER — DEXTROSE 5 % IV SOLN
70.0000 mg/m2 | Freq: Once | INTRAVENOUS | Status: AC
  Administered 2024-03-10: 120 mg via INTRAVENOUS
  Filled 2024-03-10: qty 60

## 2024-03-10 MED ORDER — SODIUM CHLORIDE 0.9 % IV SOLN
40.0000 mg | Freq: Once | INTRAVENOUS | Status: AC
  Administered 2024-03-10: 40 mg via INTRAVENOUS
  Filled 2024-03-10: qty 4

## 2024-03-10 NOTE — Progress Notes (Signed)
 [facility] [phone]   [phone]  PROGRESS NOTE  Patient Care Team: Kip Righter, MD as PCP - General (Family Medicine)  CHIEF COMPLAINTS/PURPOSE OF CONSULTATION:  IgG Lambda Multiple Myeloma  ONCOLOGIC HISTORY: 07/27/2018-09/2018: Received weekly velcade  plus dexamethasone . Therapy changed due to poor response.  09/2018: Started Revlimid  25 mg for 21 days on, 7 days off of a 28 day cycle with dexamethasone . Therapy was held after 2 cycles because of severe dermatological toxicity 12/2018: Pomalyst  1 mg daily for 21 days.  She completed 4 cycles of therapy and therapy was held due to occurrence of dermatological toxicity.  07/2019: Started Ninlaro  4 mg weekly with dexamethasone  20 mg weekly for 3 weeks on and 1 week off. Daratumumab  was addd in April 2021. Ninlaro  was discontinued in November 2021. 05/18/2020: Started monthly daratumumab  and transitioned to q 3 month schedule in April 2022.  11/06/2022: Darzalex  discontinued due to progression of disease. M protein 0.9 with worsening lytic lesions in the skeleton.  11/20/2022: Cycle 1 Day 1 of Kyprolis /Dex therapy.  12/19/2022: Cycle 2 Day 1 of Kyprolis /Dex therapy 01/15/2023: Cycle 3 Day 1 of Kyprolis /Dex therapy 02/19/2023: Cycle 4 Day 1 of Kyprolis /Dex therapy 03/18/2023: Cycle 5 Day 1 of Kyprolis /Dex therapy 04/25/2023: Cycle 6 Day 1 of Kyprolis /Dex therapy 05/28/2023: Cycle 7 Day 1 of Kyprolis /Dex therapy 06/11/2023: Cycle 8 Day 1 of Kyprolis /Dex therapy 07/08/2023 Cycle 9 Day 1 of Kyprolis /Dex therapy 09/11/2023 Cycle 10 Day 1 of Kyprolis /Dex therapy 10/09/2023: Cycle 11 Day 1 of Kyprolis /Dex therapy 11/05/2023: Cycle 12 Day 1 of Kyprolis /Dex therapy 12/02/2023: Cycle 13 Day 1 of Kyprolis /Dex therapy 01/17/2024: Cycle 14 Day 1 of Kyprolis /Dex therapy 02/10/2024: Cycle 15 Day 1 of Kyprolis /Dex therapy  HISTORY OF PRESENTING ILLNESS:  Nicole Keith 83 y.o. female returns for a follow up for IgG  lambda multiple myeloma. She was last seen on 02/24/2024. She presents today for Cycle 16, Day 1 of Kyprolis /Dex.   On exam today, Nicole Keith reports she is saddened that she cannot do the walking she would normally like to do because of the heat and humidity outside.  She reports that she also cannot At her campsite because of his recent destruction.  She notes that she also skipped a few treatments in the interim since our last visit.  She reports that she has been under a lot of stress and has been staying at home cleaning up closets in storage.  She did recently come across photographs of herself and her mother at age 73.  She reports that she reports the last treatment she received of Kyprolis  seemed potent.  She notes that she is not having any other major side effects other than the rash on her arms which is responding well to steroid cream.  She denies any nausea, vomiting, or diarrhea.  She reports that she does take Pepto-Bismol on a regular basis and it does cause black stools.  Otherwise she feels well and has no questions concerns or complaints today.  Full 10 point ROS is otherwise negative.  Overall she is willing and able to continue with Kyprolis  therapy at this time.  MEDICAL HISTORY:  Past Medical History:  Diagnosis Date   Allergic rhinitis    Arthritis    Colitis, collagenous    Diverticular disease    severe sig tics/fixation 2012   Family hx of colon cancer    Fracture of femoral neck, right (HCC) 01/09/2013   GERD (gastroesophageal reflux disease)    exacerbation of  reflux-like symptoms 04/2011   Hiatal hernia    Hyperlipidemia    muliplt myelom 2020   Osteoarthritis of right hip 01/10/2013    SURGICAL HISTORY: Past Surgical History:  Procedure Laterality Date   CHOLECYSTECTOMY     EYE SURGERY  feb 2016   cataract right eye   LEG SURGERY Left    TONSILLECTOMY     TOTAL HIP ARTHROPLASTY Right 01/10/2013   Procedure: TOTAL HIP ARTHROPLASTY;  Surgeon: Fonda SHAUNNA Olmsted, MD;  Location: [facility] OR;  Service: Orthopedics;  Laterality: Right;    SOCIAL HISTORY: Social History   Socioeconomic History   Marital status: Married    Spouse name: Not on file   Number of children: 0   Years of education: Not on file   Highest education level: Some college, no degree  Occupational History   Occupation: retired  Tobacco Use   Smoking status: Former    Current packs/day: 0.00    Types: Cigarettes    Quit date: 01/10/1972    Years since quitting: 52.2   Smokeless tobacco: Never   Tobacco comments:    quit 1973  Vaping Use   Vaping status: Never Used  Substance and Sexual Activity   Alcohol use: No   Drug use: No   Sexual activity: Not on file  Other Topics Concern   Not on file  Social History Narrative   Married, no children. Drinks 2 cups decaf coffee a day. Walks daily. Is active, plays golf, she and her husband go hiking frequently. 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   Social Drivers of Corporate investment banker Strain: Not on file  Food Insecurity: No Food Insecurity (01/17/2023)   Hunger Vital Sign    Worried About Running Out of Food in the Last Year: Never true    Ran Out of Food in the Last Year: Never true  Transportation Needs: No Transportation Needs (01/17/2023)   PRAPARE - Administrator, Civil Service (Medical): No    Lack of Transportation (Non-Medical): No  Physical Activity: Not on file  Stress: Not on file  Social Connections: Not on file  Intimate Partner Violence: Not At Risk (01/17/2023)   Humiliation, Afraid, Rape, and Kick questionnaire    Fear of Current or Ex-Partner: No    Emotionally Abused: No    Physically Abused: No    Sexually Abused: No    FAMILY HISTORY: Family History  Problem Relation Age of Onset   Congestive Heart Failure Mother    Arthritis-Osteo Mother    Colon cancer Mother    Heart attack Father    Rheum arthritis Father    Colon cancer Maternal Aunt    Colon  cancer Maternal Aunt    Hypothyroidism Sister     ALLERGIES:  is allergic to kyprolis  [carfilzomib ], pomalyst  [pomalidomide ], revlimid  [lenalidomide ], betadine [povidone iodine], flagyl [metronidazole], fosamax [alendronate sodium], bactrim [sulfamethoxazole-trimethoprim], clindamycin/lincomycin, doxepin hcl, hydrocodone -acetaminophen , hydroxyzine, hydroxyzine hcl, tramadol hcl, valium [diazepam], actonel [risedronate sodium], boniva [ibandronate], doxycycline, and penicillins.  MEDICATIONS:  Current Outpatient Medications  Medication Sig Dispense Refill   acetaminophen  (TYLENOL ) 500 MG tablet Take 1,000 mg by mouth See admin instructions. Take 1,000 mg by mouth after breakfast, at 3:30 PM, and at 8:30 PM     acyclovir  (ZOVIRAX ) 400 MG tablet Take 1 tablet (400 mg total) by mouth 2 (two) times daily. 60 tablet 3   albuterol  (VENTOLIN  HFA) 108 (90 Base) MCG/ACT inhaler SMARTSIG:1 Puff(s) By Mouth Every 4-6 Hours  PRN     augmented betamethasone dipropionate (DIPROLENE-AF) 0.05 % cream SMARTSIG:sparingly Topical Twice Daily PRN     bismuth subsalicylate (PEPTO BISMOL) 262 MG/15ML suspension Take 30 mLs by mouth every 6 (six) hours as needed.     famotidine  (PEPCID ) 20 MG tablet Take 1 tablet (20 mg total) by mouth daily. 30 tablet 0   famotidine -calcium carbonate-magnesium hydroxide (PEPCID  COMPLETE) 10-800-165 MG chewable tablet Chew 1 tablet by mouth daily as needed (for heartburn or indigestion).      guaiFENesin  (MUCINEX ) 600 MG 12 hr tablet Take 1 tablet (600 mg total) by mouth 2 (two) times daily. 40 tablet 0   hydrocortisone 2.5 % cream Apply topically 2 (two) times daily.     loperamide  (IMODIUM ) 2 MG capsule Take 1 capsule (2 mg total) by mouth 3 (three) times daily between meals as needed for diarrhea or loose stools. 30 capsule 1   triamcinolone  cream (KENALOG ) 0.1 % Apply 1 Application topically 2 (two) times daily. (Patient taking differently: Apply 1 Application topically 2 (two) times  daily as needed (for allergic flares- affected areas).) 453.6 g 2   VASELINE PURE ULTRA WHITE ointment Apply 1 application  topically See admin instructions. Apply to the genitalia in the morning and at bedtime     No current facility-administered medications for this visit.   Facility-Administered Medications Ordered in Other Visits  Medication Dose Route Frequency Provider Last Rate Last Admin   carfilzomib  (KYPROLIS ) 120 mg in dextrose  5 % 100 mL chemo infusion  70 mg/m2 (Treatment Plan Recorded) Intravenous Once Federico Norleen ONEIDA MADISON, MD        REVIEW OF SYSTEMS:   Constitutional: ( - ) fevers, ( - )  chills , ( - ) night sweats Eyes: ( - ) blurriness of vision, ( - ) double vision, ( - ) watery eyes Ears, nose, mouth, throat, and face: ( - ) mucositis, ( - ) sore throat Respiratory: ( - ) cough, ( - ) dyspnea, ( - ) wheezes Cardiovascular: ( - ) palpitation, ( - ) chest discomfort, ( - ) lower extremity swelling Gastrointestinal:  ( - ) nausea, ( - ) heartburn, ( - ) change in bowel habits Skin: ( +) abnormal skin rashes Lymphatics: ( - ) new lymphadenopathy, ( - ) easy bruising Neurological: ( - ) numbness, ( - ) tingling, ( - ) new weaknesses Behavioral/Psych: ( - ) mood change, ( - ) new changes  All other systems were reviewed with the patient and are negative.  PHYSICAL EXAMINATION: ECOG PERFORMANCE STATUS: 1 - Symptomatic but completely ambulatory  Vitals:   03/10/24 1057  BP: (!) 155/85  Pulse: 62  Resp: 13  Temp: 98.1 F (36.7 C)  SpO2: 96%       Filed Weights   03/10/24 1057  Weight: 125 lb 14.4 oz (57.1 kg)       GENERAL: well appearing female in NAD  SKIN: skin color, texture, turgor are normal. Diffuse, flat erythematous rash.  EYES: conjunctiva are pink and non-injected, sclera clear LUNGS: clear to auscultation and percussion with normal breathing effort HEART: regular rate & rhythm and no murmurs and no lower extremity edema Musculoskeletal: no  cyanosis of digits and no clubbing  PSYCH: alert & oriented x 3, fluent speech NEURO: no focal motor/sensory deficits  LABORATORY DATA:  I have reviewed the data as listed    Latest Ref Rng & Units 03/10/2024   10:10 AM 02/24/2024    9:28 AM 02/10/2024  8:51 AM  CBC  WBC 4.0 - 10.5 K/uL 8.3  8.6  8.6   Hemoglobin 12.0 - 15.0 g/dL 86.7  86.4  86.7   Hematocrit 36.0 - 46.0 % 39.2  40.3  40.3   Platelets 150 - 400 K/uL 442  422  460        Latest Ref Rng & Units 03/10/2024   10:10 AM 02/24/2024    9:28 AM 02/10/2024    8:51 AM  CMP  Glucose 70 - 99 mg/dL 891  857  878   BUN 8 - 23 mg/dL 12  10  9    Creatinine 0.44 - 1.00 mg/dL 9.38  9.31  9.33   Sodium 135 - 145 mmol/L 138  139  139   Potassium 3.5 - 5.1 mmol/L 4.2  4.0  4.2   Chloride 98 - 111 mmol/L 104  105  105   CO2 22 - 32 mmol/L 28  29  28    Calcium 8.9 - 10.3 mg/dL 8.8  9.2  9.1   Total Protein 6.5 - 8.1 g/dL 6.3  6.1  6.0   Total Bilirubin 0.0 - 1.2 mg/dL 0.7  0.5  0.5   Alkaline Phos 38 - 126 U/L 45  45  48   AST 15 - 41 U/L 19  19  19    ALT 0 - 44 U/L 12  15  16      ASSESSMENT & PLAN AMAYA BLAKEMAN is a 83 y.o.female who presents to the clinic for follow up for multiple myeloma.  #IgG Lambda Multiple Myeloma: --Initially presented in April 2016 with M spike of 3.2 g/dL and IgG level of 5314. Underwent bone marrow biopsy from 11/20/2014 show 29% plasma cells. 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  --In December 2019 with rising M spike of 5.9 g/dL and worsening anemia, with Hgb 8.7, she met criteria for transformation of active multiple myeloma.  --From 07/27/2018-09/2018, received weekly velcade  plus dexamethasone . Therapy changed due to poor response.  --In 09/2018, started Revlimid  25 mg for 21 days on, 7 days off of a 28 day cycle with dexamethasone . Therapy was held after 2 cycles because of severe dermatological toxicity --In 12/2018, switched to Pomalyst  1 mg daily for 21 days.  She  completed 4 cycles of therapy and therapy was held due to occurrence of dermatological toxicity.  --In 07/2019, switch to Ninlaro  4 mg weekly with dexamethasone  20 mg weekly for 3 weeks on and 1 week off. Daratumumab  was addd in April 2021. Ninlaro  was discontinued in November 2021. --On 05/18/2020, started monthly daratumumab  and transitioned to q 3 month schedule in April 2022.  --On 11/20/2022, switch to Kyprolis /Dex due to rising M-protein and bone survey that showed worsening lytic lesions. --Switched to biweekly infusion of Kyprolis /Dex per patient request  as she wants to prioritize her quality of life PLAN:   --Due for cycle 16, Day 1 today. --Labs today show white blood cell count 8.3, hemoglobin 13.2, MCV 94.2, platelets 442, Creatinine and LFTs in range.  --Most recent myeloma labs from 02/10/2024 showed M protein to 0.6, kappa light chain 1.6, lambda light chain 23.7, ratio 0.07.   --RTC in 2 weeks for continuation of Kyprolis /Dex with continued biweekly treatments.   #Diffuse erythematous rash-stable --Managed by dermatology.  Patient prescribed 3 different steroid creams with instructions on how to use them which areas to apply. -- nystatin  powder for groin area.   #Supportive Care -- port not required but can be placed if  requested.  -- zofran  8mg  q8H PRN and compazine  10mg  PO q6H for nausea -- acyclovir  400mg  PO BID for VCZ prophylaxis -- no pain medication required at this time.  -- discussed role for zometa  therapy in the setting her lytic lesions. Explained zometa  is given q 12 weeks and requires a dental clearance.  After discussing this with the patient further today she knows she would like to hold on this for the time being as she is afraid of the side effects.  Strongly emphasized that she should reconsider as this will be helpful with her lytic bone lesions.  No orders of the defined types were placed in this encounter.  All questions were answered. The patient knows to  call the clinic with any problems, questions or concerns.  I have spent a total of 30 minutes minutes of face-to-face and non-face-to-face time, preparing to see the patient,  performing a medically appropriate examination, counseling and educating the patient, ordering medications/tests/procedures,documenting clinical information in the electronic health record, and care coordination.   Norleen IVAR Kidney, MD Department of Hematology/Oncology [facility] at [facility] Phone: [phone] Pager: [phone] Email: [email or web address]

## 2024-03-10 NOTE — Patient Instructions (Signed)

## 2024-03-11 LAB — KAPPA/LAMBDA LIGHT CHAINS
Kappa free light chain: 1.4 mg/L — ABNORMAL LOW (ref 3.3–19.4)
Kappa, lambda light chain ratio: 0.05 — ABNORMAL LOW (ref 0.26–1.65)
Lambda free light chains: 30 mg/L — ABNORMAL HIGH (ref 5.7–26.3)

## 2024-03-13 LAB — MULTIPLE MYELOMA PANEL, SERUM
Albumin SerPl Elph-Mcnc: 3.6 g/dL (ref 2.9–4.4)
Albumin/Glob SerPl: 1.4 (ref 0.7–1.7)
Alpha 1: 0.2 g/dL (ref 0.0–0.4)
Alpha2 Glob SerPl Elph-Mcnc: 0.6 g/dL (ref 0.4–1.0)
B-Globulin SerPl Elph-Mcnc: 0.8 g/dL (ref 0.7–1.3)
Gamma Glob SerPl Elph-Mcnc: 0.9 g/dL (ref 0.4–1.8)
Globulin, Total: 2.6 g/dL (ref 2.2–3.9)
IgA: 8 mg/dL — ABNORMAL LOW (ref 64–422)
IgG (Immunoglobin G), Serum: 970 mg/dL (ref 586–1602)
IgM (Immunoglobulin M), Srm: 7 mg/dL — ABNORMAL LOW (ref 26–217)
M Protein SerPl Elph-Mcnc: 0.8 g/dL — ABNORMAL HIGH
Total Protein ELP: 6.2 g/dL (ref 6.0–8.5)

## 2024-03-16 DIAGNOSIS — I6529 Occlusion and stenosis of unspecified carotid artery: Secondary | ICD-10-CM | POA: Diagnosis not present

## 2024-03-16 DIAGNOSIS — R7303 Prediabetes: Secondary | ICD-10-CM | POA: Diagnosis not present

## 2024-03-16 DIAGNOSIS — Z0000 Encounter for general adult medical examination without abnormal findings: Secondary | ICD-10-CM | POA: Diagnosis not present

## 2024-03-16 DIAGNOSIS — E785 Hyperlipidemia, unspecified: Secondary | ICD-10-CM | POA: Diagnosis not present

## 2024-03-16 DIAGNOSIS — E559 Vitamin D deficiency, unspecified: Secondary | ICD-10-CM | POA: Diagnosis not present

## 2024-03-16 DIAGNOSIS — C9000 Multiple myeloma not having achieved remission: Secondary | ICD-10-CM | POA: Diagnosis not present

## 2024-03-16 DIAGNOSIS — M810 Age-related osteoporosis without current pathological fracture: Secondary | ICD-10-CM | POA: Diagnosis not present

## 2024-03-16 DIAGNOSIS — M25562 Pain in left knee: Secondary | ICD-10-CM | POA: Diagnosis not present

## 2024-03-23 ENCOUNTER — Other Ambulatory Visit: Payer: Self-pay

## 2024-03-23 ENCOUNTER — Inpatient Hospital Stay

## 2024-03-23 ENCOUNTER — Inpatient Hospital Stay: Admitting: Hematology and Oncology

## 2024-03-23 ENCOUNTER — Encounter: Payer: Self-pay | Admitting: Hematology and Oncology

## 2024-03-23 ENCOUNTER — Inpatient Hospital Stay: Attending: Physician Assistant

## 2024-03-23 ENCOUNTER — Ambulatory Visit (HOSPITAL_COMMUNITY)
Admission: RE | Admit: 2024-03-23 | Discharge: 2024-03-23 | Disposition: A | Discharge status: Home / Self Care | Source: Ambulatory Visit | Attending: Physician Assistant | Admitting: Physician Assistant

## 2024-03-23 VITALS — BP 150/69 | HR 61 | Temp 97.3°F | Resp 13 | Wt 124.4 lb

## 2024-03-23 DIAGNOSIS — C9000 Multiple myeloma not having achieved remission: Secondary | ICD-10-CM

## 2024-03-23 DIAGNOSIS — Z79899 Other long term (current) drug therapy: Secondary | ICD-10-CM | POA: Insufficient documentation

## 2024-03-23 DIAGNOSIS — S12101A Unspecified nondisplaced fracture of second cervical vertebra, initial encounter for closed fracture: Secondary | ICD-10-CM | POA: Diagnosis not present

## 2024-03-23 DIAGNOSIS — Z5112 Encounter for antineoplastic immunotherapy: Secondary | ICD-10-CM | POA: Diagnosis not present

## 2024-03-23 LAB — CBC WITH DIFFERENTIAL (CANCER CENTER ONLY)
Abs Immature Granulocytes: 0.04 K/uL (ref 0.00–0.07)
Basophils Absolute: 0.1 K/uL (ref 0.0–0.1)
Basophils Relative: 1 %
Eosinophils Absolute: 0.2 K/uL (ref 0.0–0.5)
Eosinophils Relative: 2 %
HCT: 37.6 % (ref 36.0–46.0)
Hemoglobin: 12.8 g/dL (ref 12.0–15.0)
Immature Granulocytes: 0 %
Lymphocytes Relative: 15 %
Lymphs Abs: 1.5 K/uL (ref 0.7–4.0)
MCH: 32.2 pg (ref 26.0–34.0)
MCHC: 34 g/dL (ref 30.0–36.0)
MCV: 94.7 fL (ref 80.0–100.0)
Monocytes Absolute: 0.8 K/uL (ref 0.1–1.0)
Monocytes Relative: 9 %
Neutro Abs: 6.9 K/uL (ref 1.7–7.7)
Neutrophils Relative %: 73 %
Platelet Count: 404 K/uL — ABNORMAL HIGH (ref 150–400)
RBC: 3.97 MIL/uL (ref 3.87–5.11)
RDW: 13.4 % (ref 11.5–15.5)
WBC Count: 9.5 K/uL (ref 4.0–10.5)
nRBC: 0 % (ref 0.0–0.2)

## 2024-03-23 LAB — CMP (CANCER CENTER ONLY)
ALT: 14 U/L (ref 0–44)
AST: 20 U/L (ref 15–41)
Albumin: 3.9 g/dL (ref 3.5–5.0)
Alkaline Phosphatase: 49 U/L (ref 38–126)
Anion gap: 5 (ref 5–15)
BUN: 10 mg/dL (ref 8–23)
CO2: 26 mmol/L (ref 22–32)
Calcium: 8.6 mg/dL — ABNORMAL LOW (ref 8.9–10.3)
Chloride: 106 mmol/L (ref 98–111)
Creatinine: 0.57 mg/dL (ref 0.44–1.00)
GFR, Estimated: >60 mL/min (ref >60)
Glucose, Bld: 105 mg/dL — ABNORMAL HIGH (ref 70–99)
Potassium: 4.2 mmol/L (ref 3.5–5.1)
Sodium: 137 mmol/L (ref 135–145)
Total Bilirubin: 0.8 mg/dL (ref 0.0–1.2)
Total Protein: 6.2 g/dL — ABNORMAL LOW (ref 6.5–8.1)

## 2024-03-23 MED ORDER — DEXTROSE 5 % IV SOLN
70.0000 mg/m2 | Freq: Once | INTRAVENOUS | Status: AC
  Administered 2024-03-23 (×2): 120 mg via INTRAVENOUS
  Filled 2024-03-23: qty 60

## 2024-03-23 MED ORDER — SODIUM CHLORIDE 0.9 % IV SOLN
Freq: Once | INTRAVENOUS | Status: AC
Start: 2024-03-23 — End: 2024-03-23

## 2024-03-23 MED ORDER — SODIUM CHLORIDE 0.9% FLUSH: 10.0000 mL | INTRAVENOUS | Status: DC | PRN

## 2024-03-23 MED ORDER — SODIUM CHLORIDE 0.9 % IV SOLN
40.0000 mg | Freq: Once | INTRAVENOUS | Status: AC
  Administered 2024-03-23 (×2): 40 mg via INTRAVENOUS
  Filled 2024-03-23: qty 4

## 2024-03-23 NOTE — Progress Notes (Signed)
 [facility] [phone]   [phone]  PROGRESS NOTE  Patient Care Team: Kip Righter, MD as PCP - General (Family Medicine)  CHIEF COMPLAINTS/PURPOSE OF CONSULTATION:  IgG Lambda Multiple Myeloma  ONCOLOGIC HISTORY: 07/27/2018-09/2018: Received weekly velcade  plus dexamethasone . Therapy changed due to poor response.  09/2018: Started Revlimid  25 mg for 21 days on, 7 days off of a 28 day cycle with dexamethasone . Therapy was held after 2 cycles because of severe dermatological toxicity 12/2018: Pomalyst  1 mg daily for 21 days.  She completed 4 cycles of therapy and therapy was held due to occurrence of dermatological toxicity.  07/2019: Started Ninlaro  4 mg weekly with dexamethasone  20 mg weekly for 3 weeks on and 1 week off. Daratumumab  was addd in April 2021. Ninlaro  was discontinued in November 2021. 05/18/2020: Started monthly daratumumab  and transitioned to q 3 month schedule in April 2022.  11/06/2022: Darzalex  discontinued due to progression of disease. M protein 0.9 with worsening lytic lesions in the skeleton.  11/20/2022: Cycle 1 Day 1 of Kyprolis /Dex therapy.  12/19/2022: Cycle 2 Day 1 of Kyprolis /Dex therapy 01/15/2023: Cycle 3 Day 1 of Kyprolis /Dex therapy 02/19/2023: Cycle 4 Day 1 of Kyprolis /Dex therapy 03/18/2023: Cycle 5 Day 1 of Kyprolis /Dex therapy 04/25/2023: Cycle 6 Day 1 of Kyprolis /Dex therapy 05/28/2023: Cycle 7 Day 1 of Kyprolis /Dex therapy 06/11/2023: Cycle 8 Day 1 of Kyprolis /Dex therapy 07/08/2023 Cycle 9 Day 1 of Kyprolis /Dex therapy 09/11/2023 Cycle 10 Day 1 of Kyprolis /Dex therapy 10/09/2023: Cycle 11 Day 1 of Kyprolis /Dex therapy 11/05/2023: Cycle 12 Day 1 of Kyprolis /Dex therapy 12/02/2023: Cycle 13 Day 1 of Kyprolis /Dex therapy 01/17/2024: Cycle 14 Day 1 of Kyprolis /Dex therapy 02/10/2024: Cycle 15 Day 1 of Kyprolis /Dex therapy  HISTORY OF PRESENTING ILLNESS:  Nicole Keith 83 y.o. female returns for a follow up for IgG  lambda multiple myeloma. She was last seen on 03/10/2024. She presents today for Cycle 16, Day 15 of Kyprolis /Dex.   On exam today, Nicole Keith reports she has been feeling good overall in the interim since her last visit.  She reports the last few nights she slept very well.  She reports the rash continues to flareup all over her body but the steroid cream has been helping.  She reports that she does feel that she has been having a few loose stools but no frank diarrhea.  She notes that she is not having any pain anywhere such as bone pain or back pain.  Overall she is tolerating the Kyprolis  therapy well with no major side effects.  She is not having any lightheadedness, dizziness, or shortness of breath.  She denies any nausea or vomiting.  Full 10 point ROS is otherwise negative.  Today I discussed with her that her M protein continues to rise.  I do not believe her current dosing of Kyprolis  is keeping her disease under control.  Additionally I am concerned that Kyprolis  may not be able to get her disease under control, even at full frequency dosing.  She voiced understanding of our findings and noted that she wanted to continue for now to see the trend next month.  She does not wish to stop therapy at this time.  We discussed options including comfort based care but I also noted that in terms of chemotherapy treatments we are not able to offer any other lines of therapy given her numerous prior treatments and their progression.  She voiced understanding of our findings and recommendations moving forward.  MEDICAL HISTORY:  Past  Medical History:  Diagnosis Date   Allergic rhinitis    Arthritis    Colitis, collagenous    Diverticular disease    severe sig tics/fixation 2012   Family hx of colon cancer    Fracture of femoral neck, right (HCC) 01/09/2013   GERD (gastroesophageal reflux disease)    exacerbation of reflux-like symptoms 04/2011   Hiatal hernia    Hyperlipidemia    muliplt myelom 2020    Osteoarthritis of right hip 01/10/2013    SURGICAL HISTORY: Past Surgical History:  Procedure Laterality Date   CHOLECYSTECTOMY     EYE SURGERY  feb 2016   cataract right eye   LEG SURGERY Left    TONSILLECTOMY     TOTAL HIP ARTHROPLASTY Right 01/10/2013   Procedure: TOTAL HIP ARTHROPLASTY;  Surgeon: Fonda SHAUNNA Olmsted, MD;  Location: [facility] OR;  Service: Orthopedics;  Laterality: Right;    SOCIAL HISTORY: Social History   Socioeconomic History   Marital status: Married    Spouse name: Not on file   Number of children: 0   Years of education: Not on file   Highest education level: Some college, no degree  Occupational History   Occupation: retired  Tobacco Use   Smoking status: Former    Current packs/day: 0.00    Types: Cigarettes    Quit date: 01/10/1972    Years since quitting: 52.2   Smokeless tobacco: Never   Tobacco comments:    quit 1973  Vaping Use   Vaping status: Never Used  Substance and Sexual Activity   Alcohol use: No   Drug use: No   Sexual activity: Not on file  Other Topics Concern   Not on file  Social History Narrative   Married, no children. Drinks 2 cups decaf coffee a day. Walks daily. Is active, plays golf, she and her husband go hiking frequently. 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   Social Drivers of Corporate investment banker Strain: Not on file  Food Insecurity: No Food Insecurity (01/17/2023)   Hunger Vital Sign    Worried About Running Out of Food in the Last Year: Never true    Ran Out of Food in the Last Year: Never true  Transportation Needs: No Transportation Needs (01/17/2023)   PRAPARE - Administrator, Civil Service (Medical): No    Lack of Transportation (Non-Medical): No  Physical Activity: Not on file  Stress: Not on file  Social Connections: Not on file  Intimate Partner Violence: Not At Risk (01/17/2023)   Humiliation, Afraid, Rape, and Kick questionnaire    Fear of Current or Ex-Partner: No     Emotionally Abused: No    Physically Abused: No    Sexually Abused: No    FAMILY HISTORY: Family History  Problem Relation Age of Onset   Congestive Heart Failure Mother    Arthritis-Osteo Mother    Colon cancer Mother    Heart attack Father    Rheum arthritis Father    Colon cancer Maternal Aunt    Colon cancer Maternal Aunt    Hypothyroidism Sister     ALLERGIES:  is allergic to kyprolis  [carfilzomib ], pomalyst  [pomalidomide ], revlimid  [lenalidomide ], betadine [povidone iodine], flagyl [metronidazole], fosamax [alendronate sodium], bactrim [sulfamethoxazole-trimethoprim], clindamycin/lincomycin, doxepin hcl, hydrocodone -acetaminophen , hydroxyzine, hydroxyzine hcl, tramadol hcl, valium [diazepam], actonel [risedronate sodium], boniva [ibandronate], doxycycline, and penicillins.  MEDICATIONS:  Current Outpatient Medications  Medication Sig Dispense Refill   acetaminophen  (TYLENOL ) 500 MG tablet Take 1,000 mg by mouth  See admin instructions. Take 1,000 mg by mouth after breakfast, at 3:30 PM, and at 8:30 PM     acyclovir  (ZOVIRAX ) 400 MG tablet Take 1 tablet (400 mg total) by mouth 2 (two) times daily. 60 tablet 3   albuterol  (VENTOLIN  HFA) 108 (90 Base) MCG/ACT inhaler SMARTSIG:1 Puff(s) By Mouth Every 4-6 Hours PRN     augmented betamethasone dipropionate (DIPROLENE-AF) 0.05 % cream SMARTSIG:sparingly Topical Twice Daily PRN     bismuth subsalicylate (PEPTO BISMOL) 262 MG/15ML suspension Take 30 mLs by mouth every 6 (six) hours as needed.     famotidine  (PEPCID ) 20 MG tablet Take 1 tablet (20 mg total) by mouth daily. 30 tablet 0   famotidine -calcium carbonate-magnesium hydroxide (PEPCID  COMPLETE) 10-800-165 MG chewable tablet Chew 1 tablet by mouth daily as needed (for heartburn or indigestion).      guaiFENesin  (MUCINEX ) 600 MG 12 hr tablet Take 1 tablet (600 mg total) by mouth 2 (two) times daily. 40 tablet 0   hydrocortisone 2.5 % cream Apply topically 2 (two) times daily.      loperamide  (IMODIUM ) 2 MG capsule Take 1 capsule (2 mg total) by mouth 3 (three) times daily between meals as needed for diarrhea or loose stools. 30 capsule 1   triamcinolone  cream (KENALOG ) 0.1 % Apply 1 Application topically 2 (two) times daily. (Patient taking differently: Apply 1 Application topically 2 (two) times daily as needed (for allergic flares- affected areas).) 453.6 g 2   VASELINE PURE ULTRA WHITE ointment Apply 1 application  topically See admin instructions. Apply to the genitalia in the morning and at bedtime     No current facility-administered medications for this visit.    REVIEW OF SYSTEMS:   Constitutional: ( - ) fevers, ( - )  chills , ( - ) night sweats Eyes: ( - ) blurriness of vision, ( - ) double vision, ( - ) watery eyes Ears, nose, mouth, throat, and face: ( - ) mucositis, ( - ) sore throat Respiratory: ( - ) cough, ( - ) dyspnea, ( - ) wheezes Cardiovascular: ( - ) palpitation, ( - ) chest discomfort, ( - ) lower extremity swelling Gastrointestinal:  ( - ) nausea, ( - ) heartburn, ( - ) change in bowel habits Skin: ( +) abnormal skin rashes Lymphatics: ( - ) new lymphadenopathy, ( - ) easy bruising Neurological: ( - ) numbness, ( - ) tingling, ( - ) new weaknesses Behavioral/Psych: ( - ) mood change, ( - ) new changes  All other systems were reviewed with the patient and are negative.  PHYSICAL EXAMINATION: ECOG PERFORMANCE STATUS: 1 - Symptomatic but completely ambulatory  Vitals:   03/23/24 1050 03/23/24 1051  BP: (!) 151/72 (!) 150/69  Pulse: 61   Resp: 13   Temp: (!) 97.3 F (36.3 C)   SpO2: 98%        Filed Weights   03/23/24 1050  Weight: 124 lb 6.4 oz (56.4 kg)       GENERAL: well appearing female in NAD  SKIN: skin color, texture, turgor are normal. Diffuse, flat erythematous rash.  EYES: conjunctiva are pink and non-injected, sclera clear LUNGS: clear to auscultation and percussion with normal breathing effort HEART: regular rate  & rhythm and no murmurs and no lower extremity edema Musculoskeletal: no cyanosis of digits and no clubbing  PSYCH: alert & oriented x 3, fluent speech NEURO: no focal motor/sensory deficits  LABORATORY DATA:  I have reviewed the data as listed  Latest Ref Rng & Units 03/23/2024    9:46 AM 03/10/2024   10:10 AM 02/24/2024    9:28 AM  CBC  WBC 4.0 - 10.5 K/uL 9.5  8.3  8.6   Hemoglobin 12.0 - 15.0 g/dL 87.1  86.7  86.4   Hematocrit 36.0 - 46.0 % 37.6  39.2  40.3   Platelets 150 - 400 K/uL 404  442  422        Latest Ref Rng & Units 03/23/2024    9:46 AM 03/10/2024   10:10 AM 02/24/2024    9:28 AM  CMP  Glucose 70 - 99 mg/dL 894  891  857   BUN 8 - 23 mg/dL 10  12  10    Creatinine 0.44 - 1.00 mg/dL 9.42  9.38  9.31   Sodium 135 - 145 mmol/L 137  138  139   Potassium 3.5 - 5.1 mmol/L 4.2  4.2  4.0   Chloride 98 - 111 mmol/L 106  104  105   CO2 22 - 32 mmol/L 26  28  29    Calcium 8.9 - 10.3 mg/dL 8.6  8.8  9.2   Total Protein 6.5 - 8.1 g/dL 6.2  6.3  6.1   Total Bilirubin 0.0 - 1.2 mg/dL 0.8  0.7  0.5   Alkaline Phos 38 - 126 U/L 49  45  45   AST 15 - 41 U/L 20  19  19    ALT 0 - 44 U/L 14  12  15      ASSESSMENT & PLAN Nicole Keith is a 83 y.o.female who presents to the clinic for follow up for multiple myeloma.  #IgG Lambda Multiple Myeloma: --Initially presented in April 2016 with M spike of 3.2 g/dL and IgG level of 5314. Underwent bone marrow biopsy from 11/20/2014 show 29% plasma cells. 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  --In December 2019 with rising M spike of 5.9 g/dL and worsening anemia, with Hgb 8.7, she met criteria for transformation of active multiple myeloma.  --From 07/27/2018-09/2018, received weekly velcade  plus dexamethasone . Therapy changed due to poor response.  --In 09/2018, started Revlimid  25 mg for 21 days on, 7 days off of a 28 day cycle with dexamethasone . Therapy was held after 2 cycles because of severe dermatological  toxicity --In 12/2018, switched to Pomalyst  1 mg daily for 21 days.  She completed 4 cycles of therapy and therapy was held due to occurrence of dermatological toxicity.  --In 07/2019, switch to Ninlaro  4 mg weekly with dexamethasone  20 mg weekly for 3 weeks on and 1 week off. Daratumumab  was addd in April 2021. Ninlaro  was discontinued in November 2021. --On 05/18/2020, started monthly daratumumab  and transitioned to q 3 month schedule in April 2022.  --On 11/20/2022, switch to Kyprolis /Dex due to rising M-protein and bone survey that showed worsening lytic lesions. --Switched to biweekly infusion of Kyprolis /Dex per patient request  as she wants to prioritize her quality of life PLAN:   --Due for cycle 16, Day 15 today. --Labs today show white blood cell count 9.5, hemoglobin 12.8, MCV 94.7, platelets 4 4. Creatinine and LFTs in range.  --Most recent myeloma labs from 02/10/2024 showed M protein to 0.8, kappa light chain 1.4, lambda light chain 30.0, ratio 0.05.   --Discussed the rising M protein with the patient today.  Details of this conversation are noted below.  Patient would like to continue the course for now and is aware of the risks and benefits. --RTC  in 2 weeks for continuation of Kyprolis /Dex with continued biweekly treatments.   #Diffuse erythematous rash-stable --Managed by dermatology.  Patient prescribed 3 different steroid creams with instructions on how to use them which areas to apply. -- nystatin  powder for groin area.   #Supportive Care -- port not required but can be placed if requested.  -- zofran  8mg  q8H PRN and compazine  10mg  PO q6H for nausea -- acyclovir  400mg  PO BID for VCZ prophylaxis -- no pain medication required at this time.  -- discussed role for zometa  therapy in the setting her lytic lesions. Explained zometa  is given q 12 weeks and requires a dental clearance.  After discussing this with the patient further today she knows she would like to hold on this for  the time being as she is afraid of the side effects.  Strongly emphasized that she should reconsider as this will be helpful with her lytic bone lesions.  No orders of the defined types were placed in this encounter.  All questions were answered. The patient knows to call the clinic with any problems, questions or concerns.  I have spent a total of 30 minutes minutes of face-to-face and non-face-to-face time, preparing to see the patient,  performing a medically appropriate examination, counseling and educating the patient, ordering medications/tests/procedures,documenting clinical information in the electronic health record, and care coordination.   Nicole IVAR Kidney, MD Department of Hematology/Oncology [facility] at [facility] Phone: [phone] Pager: [phone] Email: [email or web address]

## 2024-03-23 NOTE — Patient Instructions (Signed)

## 2024-03-27 ENCOUNTER — Encounter: Payer: Self-pay | Admitting: Hematology and Oncology

## 2024-04-05 ENCOUNTER — Other Ambulatory Visit: Payer: Self-pay

## 2024-04-06 ENCOUNTER — Inpatient Hospital Stay

## 2024-04-06 ENCOUNTER — Encounter: Payer: Self-pay | Admitting: Hematology and Oncology

## 2024-04-06 ENCOUNTER — Inpatient Hospital Stay (HOSPITAL_BASED_OUTPATIENT_CLINIC_OR_DEPARTMENT_OTHER): Admitting: Physician Assistant

## 2024-04-06 VITALS — BP 141/72 | HR 61 | Temp 97.5°F | Resp 18 | Wt 122.3 lb

## 2024-04-06 DIAGNOSIS — Z5112 Encounter for antineoplastic immunotherapy: Secondary | ICD-10-CM | POA: Diagnosis not present

## 2024-04-06 DIAGNOSIS — C9000 Multiple myeloma not having achieved remission: Secondary | ICD-10-CM

## 2024-04-06 DIAGNOSIS — Z79899 Other long term (current) drug therapy: Secondary | ICD-10-CM | POA: Diagnosis not present

## 2024-04-06 LAB — CBC WITH DIFFERENTIAL (CANCER CENTER ONLY)
Abs Immature Granulocytes: 0.04 K/uL (ref 0.00–0.07)
Basophils Absolute: 0.1 K/uL (ref 0.0–0.1)
Basophils Relative: 1 %
Eosinophils Absolute: 0.2 K/uL (ref 0.0–0.5)
Eosinophils Relative: 2 %
HCT: 40.3 % (ref 36.0–46.0)
Hemoglobin: 13.2 g/dL (ref 12.0–15.0)
Immature Granulocytes: 1 %
Lymphocytes Relative: 16 %
Lymphs Abs: 1.4 K/uL (ref 0.7–4.0)
MCH: 32 pg (ref 26.0–34.0)
MCHC: 32.8 g/dL (ref 30.0–36.0)
MCV: 97.6 fL (ref 80.0–100.0)
Monocytes Absolute: 0.7 K/uL (ref 0.1–1.0)
Monocytes Relative: 8 %
Neutro Abs: 6.3 K/uL (ref 1.7–7.7)
Neutrophils Relative %: 72 %
Platelet Count: 451 K/uL — ABNORMAL HIGH (ref 150–400)
RBC: 4.13 MIL/uL (ref 3.87–5.11)
RDW: 13.3 % (ref 11.5–15.5)
WBC Count: 8.7 K/uL (ref 4.0–10.5)
nRBC: 0 % (ref 0.0–0.2)

## 2024-04-06 LAB — CMP (CANCER CENTER ONLY)
ALT: 14 U/L (ref 0–44)
AST: 20 U/L (ref 15–41)
Albumin: 3.9 g/dL (ref 3.5–5.0)
Alkaline Phosphatase: 55 U/L (ref 38–126)
Anion gap: 6 (ref 5–15)
BUN: 9 mg/dL (ref 8–23)
CO2: 28 mmol/L (ref 22–32)
Calcium: 8.9 mg/dL (ref 8.9–10.3)
Chloride: 105 mmol/L (ref 98–111)
Creatinine: 0.64 mg/dL (ref 0.44–1.00)
GFR, Estimated: >60 mL/min (ref >60)
Glucose, Bld: 100 mg/dL — ABNORMAL HIGH (ref 70–99)
Potassium: 4.3 mmol/L (ref 3.5–5.1)
Sodium: 139 mmol/L (ref 135–145)
Total Bilirubin: 1 mg/dL (ref 0.0–1.2)
Total Protein: 6.3 g/dL — ABNORMAL LOW (ref 6.5–8.1)

## 2024-04-06 MED ORDER — DEXTROSE 5 % IV SOLN
70.0000 mg/m2 | Freq: Once | INTRAVENOUS | Status: AC
  Administered 2024-04-06: 120 mg via INTRAVENOUS
  Filled 2024-04-06: qty 60

## 2024-04-06 MED ORDER — SODIUM CHLORIDE 0.9 % IV SOLN
Freq: Once | INTRAVENOUS | Status: AC
Start: 2024-04-06 — End: 2024-04-06

## 2024-04-06 MED ORDER — SODIUM CHLORIDE 0.9 % IV SOLN
40.0000 mg | Freq: Once | INTRAVENOUS | Status: AC
  Administered 2024-04-06: 40 mg via INTRAVENOUS
  Filled 2024-04-06: qty 4

## 2024-04-06 MED ORDER — SODIUM CHLORIDE 0.9 % IV SOLN: Freq: Once | INTRAVENOUS | Status: AC

## 2024-04-06 NOTE — Patient Instructions (Signed)

## 2024-04-07 ENCOUNTER — Encounter: Payer: Self-pay | Admitting: Hematology and Oncology

## 2024-04-07 LAB — KAPPA/LAMBDA LIGHT CHAINS
Kappa free light chain: 1.5 mg/L — ABNORMAL LOW (ref 3.3–19.4)
Kappa, lambda light chain ratio: 0.04 — ABNORMAL LOW (ref 0.26–1.65)
Lambda free light chains: 36.2 mg/L — ABNORMAL HIGH (ref 5.7–26.3)

## 2024-04-07 NOTE — Progress Notes (Signed)
 [facility] [phone]   [phone]  PROGRESS NOTE  Patient Care Team: Kip Righter, MD as PCP - General (Family Medicine)  CHIEF COMPLAINTS/PURPOSE OF CONSULTATION:  IgG Lambda Multiple Myeloma  ONCOLOGIC HISTORY: 07/27/2018-09/2018: Received weekly velcade  plus dexamethasone . Therapy changed due to poor response.  09/2018: Started Revlimid  25 mg for 21 days on, 7 days off of a 28 day cycle with dexamethasone . Therapy was held after 2 cycles because of severe dermatological toxicity 12/2018: Pomalyst  1 mg daily for 21 days.  She completed 4 cycles of therapy and therapy was held due to occurrence of dermatological toxicity.  07/2019: Started Ninlaro  4 mg weekly with dexamethasone  20 mg weekly for 3 weeks on and 1 week off. Daratumumab  was addd in April 2021. Ninlaro  was discontinued in November 2021. 05/18/2020: Started monthly daratumumab  and transitioned to q 3 month schedule in April 2022.  11/06/2022: Darzalex  discontinued due to progression of disease. M protein 0.9 with worsening lytic lesions in the skeleton.  11/20/2022: Cycle 1 Day 1 of Kyprolis /Dex therapy.  12/19/2022: Cycle 2 Day 1 of Kyprolis /Dex therapy 01/15/2023: Cycle 3 Day 1 of Kyprolis /Dex therapy 02/19/2023: Cycle 4 Day 1 of Kyprolis /Dex therapy 03/18/2023: Cycle 5 Day 1 of Kyprolis /Dex therapy 04/25/2023: Cycle 6 Day 1 of Kyprolis /Dex therapy 05/28/2023: Cycle 7 Day 1 of Kyprolis /Dex therapy 06/11/2023: Cycle 8 Day 1 of Kyprolis /Dex therapy 07/08/2023 Cycle 9 Day 1 of Kyprolis /Dex therapy 09/11/2023 Cycle 10 Day 1 of Kyprolis /Dex therapy 10/09/2023: Cycle 11 Day 1 of Kyprolis /Dex therapy 11/05/2023: Cycle 12 Day 1 of Kyprolis /Dex therapy 12/02/2023: Cycle 13 Day 1 of Kyprolis /Dex therapy 01/17/2024: Cycle 14 Day 1 of Kyprolis /Dex therapy 02/10/2024: Cycle 15 Day 1 of Kyprolis /Dex therapy 03/10/2024: Cycle 16 Day 1 of Kyprolis /Dex therapy 04/06/2024: Cycle 17 Day 1 of Kyprolis /Dex  therapy  HISTORY OF PRESENTING ILLNESS:  Nicole Keith 83 y.o. female returns for a follow up for IgG lambda multiple myeloma. She was last seen on 03/23/2024. She presents today for Cycle 17, Day 1 of Kyprolis /Dex.   On exam today, Nicole Keith reports after the last treatment, she had 2 near syncopal episodes at home.  She adds that she did not eat well before the episodes.  Since then, she reports her energy levels have been fairly stable.  She does have fatigue but she tries to do all her ADLs on her own.  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  she denies nausea, vomiting or abdominal pain.  She does have loose stools with certain foods such as dairy.  She denies easy bruising or signs of active bleeding.  She continues to have a erythematous rash that flares up periodically after treatment and is managed with topical steroid cream.  She denies any new bone pain or back pain.  She denies fevers, chills, night sweats, shortness of breath, chest pain or cough.  She has no other complaints.  Full 10 point ROS is otherwise negative.    MEDICAL HISTORY:  Past Medical History:  Diagnosis Date   Allergic rhinitis    Arthritis    Colitis, collagenous    Diverticular disease    severe sig tics/fixation 2012   Family hx of colon cancer    Fracture of femoral neck, right (HCC) 01/09/2013   GERD (gastroesophageal reflux disease)    exacerbation of reflux-like symptoms 04/2011   Hiatal hernia    Hyperlipidemia    muliplt myelom 2020   Osteoarthritis of right hip 01/10/2013  SURGICAL HISTORY: Past Surgical History:  Procedure Laterality Date   CHOLECYSTECTOMY     EYE SURGERY  feb 2016   cataract right eye   LEG SURGERY Left    TONSILLECTOMY     TOTAL HIP ARTHROPLASTY Right 01/10/2013   Procedure: TOTAL HIP ARTHROPLASTY;  Surgeon: Fonda SHAUNNA Olmsted, MD;  Location: [facility] OR;  Service: Orthopedics;  Laterality: Right;    SOCIAL HISTORY: Social History   Socioeconomic History   Marital  status: Married    Spouse name: Not on file   Number of children: 0   Years of education: Not on file   Highest education level: Some college, no degree  Occupational History   Occupation: retired  Tobacco Use   Smoking status: Former    Current packs/day: 0.00    Types: Cigarettes    Quit date: 01/10/1972    Years since quitting: 52.2   Smokeless tobacco: Never   Tobacco comments:    quit 1973  Vaping Use   Vaping status: Never Used  Substance and Sexual Activity   Alcohol use: No   Drug use: No   Sexual activity: Not on file  Other Topics Concern   Not on file  Social History Narrative   Married, no children. Drinks 2 cups decaf coffee a day. Walks daily. Is active, plays golf, she and her husband go hiking frequently. 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   Social Drivers of Corporate investment banker Strain: Not on file  Food Insecurity: No Food Insecurity (01/17/2023)   Hunger Vital Sign    Worried About Running Out of Food in the Last Year: Never true    Ran Out of Food in the Last Year: Never true  Transportation Needs: No Transportation Needs (01/17/2023)   PRAPARE - Administrator, Civil Service (Medical): No    Lack of Transportation (Non-Medical): No  Physical Activity: Not on file  Stress: Not on file  Social Connections: Not on file  Intimate Partner Violence: Not At Risk (01/17/2023)   Humiliation, Afraid, Rape, and Kick questionnaire    Fear of Current or Ex-Partner: No    Emotionally Abused: No    Physically Abused: No    Sexually Abused: No    FAMILY HISTORY: Family History  Problem Relation Age of Onset   Congestive Heart Failure Mother    Arthritis-Osteo Mother    Colon cancer Mother    Heart attack Father    Rheum arthritis Father    Colon cancer Maternal Aunt    Colon cancer Maternal Aunt    Hypothyroidism Sister     ALLERGIES:  is allergic to kyprolis  [carfilzomib ], pomalyst  [pomalidomide ], revlimid   [lenalidomide ], betadine [povidone iodine], flagyl [metronidazole], fosamax [alendronate sodium], bactrim [sulfamethoxazole-trimethoprim], clindamycin/lincomycin, doxepin hcl, hydrocodone -acetaminophen , hydroxyzine, hydroxyzine hcl, tramadol hcl, valium [diazepam], actonel [risedronate sodium], boniva [ibandronate], doxycycline, and penicillins.  MEDICATIONS:  Current Outpatient Medications  Medication Sig Dispense Refill   acetaminophen  (TYLENOL ) 500 MG tablet Take 1,000 mg by mouth See admin instructions. Take 1,000 mg by mouth after breakfast, at 3:30 PM, and at 8:30 PM     acyclovir  (ZOVIRAX ) 400 MG tablet Take 1 tablet (400 mg total) by mouth 2 (two) times daily. 60 tablet 3   albuterol  (VENTOLIN  HFA) 108 (90 Base) MCG/ACT inhaler SMARTSIG:1 Puff(s) By Mouth Every 4-6 Hours PRN     augmented betamethasone dipropionate (DIPROLENE-AF) 0.05 % cream SMARTSIG:sparingly Topical Twice Daily PRN     bismuth subsalicylate (PEPTO BISMOL) 262 MG/15ML  suspension Take 30 mLs by mouth every 6 (six) hours as needed.     famotidine  (PEPCID ) 20 MG tablet Take 1 tablet (20 mg total) by mouth daily. 30 tablet 0   famotidine -calcium carbonate-magnesium hydroxide (PEPCID  COMPLETE) 10-800-165 MG chewable tablet Chew 1 tablet by mouth daily as needed (for heartburn or indigestion).      guaiFENesin  (MUCINEX ) 600 MG 12 hr tablet Take 1 tablet (600 mg total) by mouth 2 (two) times daily. 40 tablet 0   hydrocortisone 2.5 % cream Apply topically 2 (two) times daily.     loperamide  (IMODIUM ) 2 MG capsule Take 1 capsule (2 mg total) by mouth 3 (three) times daily between meals as needed for diarrhea or loose stools. 30 capsule 1   triamcinolone  cream (KENALOG ) 0.1 % Apply 1 Application topically 2 (two) times daily. (Patient taking differently: Apply 1 Application topically 2 (two) times daily as needed (for allergic flares- affected areas).) 453.6 g 2   VASELINE PURE ULTRA WHITE ointment Apply 1 application  topically See  admin instructions. Apply to the genitalia in the morning and at bedtime     No current facility-administered medications for this visit.    REVIEW OF SYSTEMS:   Constitutional: ( - ) fevers, ( - )  chills , ( - ) night sweats Eyes: ( - ) blurriness of vision, ( - ) double vision, ( - ) watery eyes Ears, nose, mouth, throat, and face: ( - ) mucositis, ( - ) sore throat Respiratory: ( - ) cough, ( - ) dyspnea, ( - ) wheezes Cardiovascular: ( - ) palpitation, ( - ) chest discomfort, ( - ) lower extremity swelling Gastrointestinal:  ( - ) nausea, ( - ) heartburn, ( - ) change in bowel habits Skin: ( +) abnormal skin rashes Lymphatics: ( - ) new lymphadenopathy, ( - ) easy bruising Neurological: ( - ) numbness, ( - ) tingling, ( - ) new weaknesses Behavioral/Psych: ( - ) mood change, ( - ) new changes  All other systems were reviewed with the patient and are negative.  PHYSICAL EXAMINATION: ECOG PERFORMANCE STATUS: 1 - Symptomatic but completely ambulatory  Vitals:   04/06/24 1049 04/06/24 1057  BP: (!) 150/62 (!) 141/72  Pulse: 61   Resp: 18   Temp: (!) 97.5 F (36.4 C)   SpO2: 96%     Filed Weights   04/06/24 1049  Weight: 122 lb 4.8 oz (55.5 kg)    GENERAL: well appearing female in NAD  SKIN: skin color, texture, turgor are normal. Diffuse, flat erythematous rash.  EYES: conjunctiva are pink and non-injected, sclera clear LUNGS: clear to auscultation and percussion with normal breathing effort HEART: regular rate & rhythm and no murmurs and no lower extremity edema Musculoskeletal: no cyanosis of digits and no clubbing  PSYCH: alert & oriented x 3, fluent speech NEURO: no focal motor/sensory deficits  LABORATORY DATA:  I have reviewed the data as listed    Latest Ref Rng & Units 04/06/2024   10:18 AM 03/23/2024    9:46 AM 03/10/2024   10:10 AM  CBC  WBC 4.0 - 10.5 K/uL 8.7  9.5  8.3   Hemoglobin 12.0 - 15.0 g/dL 86.7  87.1  86.7   Hematocrit 36.0 - 46.0 % 40.3  37.6   39.2   Platelets 150 - 400 K/uL 451  404  442        Latest Ref Rng & Units 04/06/2024   10:18 AM 03/23/2024  9:46 AM 03/10/2024   10:10 AM  CMP  Glucose 70 - 99 mg/dL 899  894  891   BUN 8 - 23 mg/dL 9  10  12    Creatinine 0.44 - 1.00 mg/dL 9.35  9.42  9.38   Sodium 135 - 145 mmol/L 139  137  138   Potassium 3.5 - 5.1 mmol/L 4.3  4.2  4.2   Chloride 98 - 111 mmol/L 105  106  104   CO2 22 - 32 mmol/L 28  26  28    Calcium 8.9 - 10.3 mg/dL 8.9  8.6  8.8   Total Protein 6.5 - 8.1 g/dL 6.3  6.2  6.3   Total Bilirubin 0.0 - 1.2 mg/dL 1.0  0.8  0.7   Alkaline Phos 38 - 126 U/L 55  49  45   AST 15 - 41 U/L 20  20  19    ALT 0 - 44 U/L 14  14  12      ASSESSMENT & PLAN Nicole Keith is a 83 y.o.female who presents to the clinic for follow up for multiple myeloma.  #IgG Lambda Multiple Myeloma: --Initially presented in April 2016 with M spike of 3.2 g/dL and IgG level of 5314. Underwent bone marrow biopsy from 11/20/2014 show 29% plasma cells. 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  --In December 2019 with rising M spike of 5.9 g/dL and worsening anemia, with Hgb 8.7, she met criteria for transformation of active multiple myeloma.  --From 07/27/2018-09/2018, received weekly velcade  plus dexamethasone . Therapy changed due to poor response.  --In 09/2018, started Revlimid  25 mg for 21 days on, 7 days off of a 28 day cycle with dexamethasone . Therapy was held after 2 cycles because of severe dermatological toxicity --In 12/2018, switched to Pomalyst  1 mg daily for 21 days.  She completed 4 cycles of therapy and therapy was held due to occurrence of dermatological toxicity.  --In 07/2019, switch to Ninlaro  4 mg weekly with dexamethasone  20 mg weekly for 3 weeks on and 1 week off. Daratumumab  was addd in April 2021. Ninlaro  was discontinued in November 2021. --On 05/18/2020, started monthly daratumumab  and transitioned to q 3 month schedule in April 2022.  --On 11/20/2022,  switch to Kyprolis /Dex due to rising M-protein and bone survey that showed worsening lytic lesions. --Switched to biweekly infusion of Kyprolis /Dex per patient request  as she wants to prioritize her quality of life PLAN:   --Due for cycle 17, Day 1 today. --Labs today show white blood cell count 8.7, hemoglobin 13.2, MCV 97.6, platelets 451. Creatinine and LFTs in range.  --Most recent myeloma labs from 02/10/2024 showed M protein to 0.8, kappa light chain 1.4, lambda light chain 30.0, ratio 0.05.   --Discussed the rising M protein with the patient today. Reviewed options including trying to revert back to weekly Kyprolis  schedule versus best supportive care. Patient agreed to try weekly Kyprolis  starting Cycle 18.  --Proceed with treatment today without any dose modifications.  --RTC in 2 weeks for continuation of Kyprolis /Dex   #H/O near syncopal episodes:  --Suspect hypoglycemia as patient reports not eating the day of the episode.  --Encouraged patient to eat small, frequent meals   #Diffuse erythematous rash-stable --Managed by dermatology.  Patient prescribed 3 different steroid creams with instructions on how to use them which areas to apply. -- nystatin  powder for groin area.   #Supportive Care -- port not required but can be placed if requested.  -- zofran  8mg  q8H PRN  and compazine  10mg  PO q6H for nausea -- acyclovir  400mg  PO BID for VCZ prophylaxis -- no pain medication required at this time.  -- discussed role for zometa  therapy in the setting her lytic lesions. Explained zometa  is given q 12 weeks and requires a dental clearance.  After discussing this with the patient further today she knows she would like to hold on this for the time being as she is afraid of the side effects.  Strongly emphasized that she should reconsider as this will be helpful with her lytic bone lesions.  Orders Placed This Encounter  Procedures   Multiple Myeloma Panel (SPEP&IFE w/QIG)    Standing  Status:   Future    Expected Date:   05/11/2024    Expiration Date:   05/11/2025   Kappa/lambda light chains    Standing Status:   Future    Expected Date:   05/11/2024    Expiration Date:   05/11/2025   CBC with Differential (Cancer Center Only)    Standing Status:   Future    Expected Date:   05/11/2024    Expiration Date:   05/11/2025   CMP (Cancer Center only)    Standing Status:   Future    Expected Date:   05/11/2024    Expiration Date:   05/11/2025   Multiple Myeloma Panel (SPEP&IFE w/QIG)    Standing Status:   Future    Expected Date:   06/08/2024    Expiration Date:   06/08/2025   Kappa/lambda light chains    Standing Status:   Future    Expected Date:   06/08/2024    Expiration Date:   06/08/2025   CBC with Differential (Cancer Center Only)    Standing Status:   Future    Expected Date:   06/08/2024    Expiration Date:   06/08/2025   CMP (Cancer Center only)    Standing Status:   Future    Expected Date:   06/08/2024    Expiration Date:   06/08/2025   Multiple Myeloma Panel (SPEP&IFE w/QIG)    Standing Status:   Future    Expected Date:   07/06/2024    Expiration Date:   07/06/2025   Kappa/lambda light chains    Standing Status:   Future    Expected Date:   07/06/2024    Expiration Date:   07/06/2025   CBC with Differential (Cancer Center Only)    Standing Status:   Future    Expected Date:   07/06/2024    Expiration Date:   07/06/2025   CMP (Cancer Center only)    Standing Status:   Future    Expected Date:   07/06/2024    Expiration Date:   07/06/2025   All questions were answered. The patient knows to call the clinic with any problems, questions or concerns.  I have spent a total of 30 minutes minutes of face-to-face and non-face-to-face time, preparing to see the patient,  performing a medically appropriate examination, counseling and educating the patient, ordering medications/tests/procedures,documenting clinical information in the electronic health record,  and care coordination.   Johnston Police PA-C Dept of Hematology and Oncology [facility] Phone: [phone]

## 2024-04-11 LAB — MULTIPLE MYELOMA PANEL, SERUM
Albumin SerPl Elph-Mcnc: 3.3 g/dL (ref 2.9–4.4)
Albumin/Glob SerPl: 1.3 (ref 0.7–1.7)
Alpha 1: 0.3 g/dL (ref 0.0–0.4)
Alpha2 Glob SerPl Elph-Mcnc: 0.7 g/dL (ref 0.4–1.0)
B-Globulin SerPl Elph-Mcnc: 0.7 g/dL (ref 0.7–1.3)
Gamma Glob SerPl Elph-Mcnc: 1 g/dL (ref 0.4–1.8)
Globulin, Total: 2.7 g/dL (ref 2.2–3.9)
IgA: 8 mg/dL — ABNORMAL LOW (ref 64–422)
IgG (Immunoglobin G), Serum: 1071 mg/dL (ref 586–1602)
IgM (Immunoglobulin M), Srm: 7 mg/dL — ABNORMAL LOW (ref 26–217)
M Protein SerPl Elph-Mcnc: 0.9 g/dL — ABNORMAL HIGH
Total Protein ELP: 6 g/dL (ref 6.0–8.5)

## 2024-04-13 ENCOUNTER — Other Ambulatory Visit: Payer: Self-pay

## 2024-04-20 ENCOUNTER — Inpatient Hospital Stay: Admitting: Hematology and Oncology

## 2024-04-20 ENCOUNTER — Inpatient Hospital Stay: Attending: Physician Assistant

## 2024-04-20 ENCOUNTER — Inpatient Hospital Stay

## 2024-04-20 VITALS — BP 162/82 | HR 62 | Temp 97.8°F | Resp 13 | Wt 125.3 lb

## 2024-04-20 DIAGNOSIS — C9000 Multiple myeloma not having achieved remission: Secondary | ICD-10-CM

## 2024-04-20 DIAGNOSIS — Z5112 Encounter for antineoplastic immunotherapy: Secondary | ICD-10-CM | POA: Diagnosis not present

## 2024-04-20 DIAGNOSIS — Z79899 Other long term (current) drug therapy: Secondary | ICD-10-CM | POA: Diagnosis not present

## 2024-04-20 LAB — CBC WITH DIFFERENTIAL (CANCER CENTER ONLY)
Abs Immature Granulocytes: 0.04 K/uL (ref 0.00–0.07)
Basophils Absolute: 0.1 K/uL (ref 0.0–0.1)
Basophils Relative: 1 %
Eosinophils Absolute: 0.3 K/uL (ref 0.0–0.5)
Eosinophils Relative: 3 %
HCT: 38 % (ref 36.0–46.0)
Hemoglobin: 12.4 g/dL (ref 12.0–15.0)
Immature Granulocytes: 0 %
Lymphocytes Relative: 16 %
Lymphs Abs: 1.6 K/uL (ref 0.7–4.0)
MCH: 32.3 pg (ref 26.0–34.0)
MCHC: 32.6 g/dL (ref 30.0–36.0)
MCV: 99 fL (ref 80.0–100.0)
Monocytes Absolute: 0.8 K/uL (ref 0.1–1.0)
Monocytes Relative: 8 %
Neutro Abs: 6.9 K/uL (ref 1.7–7.7)
Neutrophils Relative %: 72 %
Platelet Count: 436 K/uL — ABNORMAL HIGH (ref 150–400)
RBC: 3.84 MIL/uL — ABNORMAL LOW (ref 3.87–5.11)
RDW: 13.1 % (ref 11.5–15.5)
WBC Count: 9.6 K/uL (ref 4.0–10.5)
nRBC: 0 % (ref 0.0–0.2)

## 2024-04-20 LAB — CMP (CANCER CENTER ONLY)
ALT: 18 U/L (ref 0–44)
AST: 20 U/L (ref 15–41)
Albumin: 3.8 g/dL (ref 3.5–5.0)
Alkaline Phosphatase: 51 U/L (ref 38–126)
Anion gap: 5 (ref 5–15)
BUN: 7 mg/dL — ABNORMAL LOW (ref 8–23)
CO2: 29 mmol/L (ref 22–32)
Calcium: 8.9 mg/dL (ref 8.9–10.3)
Chloride: 104 mmol/L (ref 98–111)
Creatinine: 0.66 mg/dL (ref 0.44–1.00)
GFR, Estimated: >60 mL/min (ref >60)
Glucose, Bld: 107 mg/dL — ABNORMAL HIGH (ref 70–99)
Potassium: 3.9 mmol/L (ref 3.5–5.1)
Sodium: 138 mmol/L (ref 135–145)
Total Bilirubin: 0.5 mg/dL (ref 0.0–1.2)
Total Protein: 6.1 g/dL — ABNORMAL LOW (ref 6.5–8.1)

## 2024-04-20 MED ORDER — SODIUM CHLORIDE 0.9 % IV SOLN
40.0000 mg | Freq: Once | INTRAVENOUS | Status: AC
  Administered 2024-04-20: 40 mg via INTRAVENOUS
  Filled 2024-04-20: qty 4

## 2024-04-20 MED ORDER — DEXTROSE 5 % IV SOLN
70.0000 mg/m2 | Freq: Once | INTRAVENOUS | Status: AC
  Administered 2024-04-20: 120 mg via INTRAVENOUS
  Filled 2024-04-20: qty 60

## 2024-04-20 MED ORDER — SODIUM CHLORIDE 0.9 % IV SOLN: Freq: Once | INTRAVENOUS | Status: AC

## 2024-04-20 NOTE — Progress Notes (Unsigned)
 [facility] [phone]   [phone]  PROGRESS NOTE  Patient Care Team: Nicole Righter, MD as PCP - General (Family Medicine)  CHIEF COMPLAINTS/PURPOSE OF CONSULTATION:  IgG Lambda Multiple Myeloma  ONCOLOGIC HISTORY: 07/27/2018-09/2018: Received weekly velcade  plus dexamethasone . Therapy changed due to poor response.  09/2018: Started Revlimid  25 mg for 21 days on, 7 days off of a 28 day cycle with dexamethasone . Therapy was held after 2 cycles because of severe dermatological toxicity 12/2018: Pomalyst  1 mg daily for 21 days.  She completed 4 cycles of therapy and therapy was held due to occurrence of dermatological toxicity.  07/2019: Started Ninlaro  4 mg weekly with dexamethasone  20 mg weekly for 3 weeks on and 1 week off. Daratumumab  was addd in April 2021. Ninlaro  was discontinued in November 2021. 05/18/2020: Started monthly daratumumab  and transitioned to q 3 month schedule in April 2022.  11/06/2022: Darzalex  discontinued due to progression of disease. M protein 0.9 with worsening lytic lesions in the skeleton.  11/20/2022: Cycle 1 Day 1 of Kyprolis /Dex therapy.  12/19/2022: Cycle 2 Day 1 of Kyprolis /Dex therapy 01/15/2023: Cycle 3 Day 1 of Kyprolis /Dex therapy 02/19/2023: Cycle 4 Day 1 of Kyprolis /Dex therapy 03/18/2023: Cycle 5 Day 1 of Kyprolis /Dex therapy 04/25/2023: Cycle 6 Day 1 of Kyprolis /Dex therapy 05/28/2023: Cycle 7 Day 1 of Kyprolis /Dex therapy 06/11/2023: Cycle 8 Day 1 of Kyprolis /Dex therapy 07/08/2023 Cycle 9 Day 1 of Kyprolis /Dex therapy 09/11/2023 Cycle 10 Day 1 of Kyprolis /Dex therapy 10/09/2023: Cycle 11 Day 1 of Kyprolis /Dex therapy 11/05/2023: Cycle 12 Day 1 of Kyprolis /Dex therapy 12/02/2023: Cycle 13 Day 1 of Kyprolis /Dex therapy 01/17/2024: Cycle 14 Day 1 of Kyprolis /Dex therapy 02/10/2024: Cycle 15 Day 1 of Kyprolis /Dex therapy 03/10/2024: Cycle 16 Day 1 of Kyprolis /Dex therapy 04/06/2024: Cycle 17 Day 1 of Kyprolis /Dex  therapy  HISTORY OF PRESENTING ILLNESS:  Nicole Keith 83 y.o. female returns for a follow up for IgG lambda multiple myeloma. She was last seen on 04/06/2024. She presents today for Cycle 17, Day 15 of Kyprolis /Dex.   On exam today, Nicole Keith reports she has been well overall in the interim since her last visit.  She reports she has not had any major negative effects as a result of her chemotherapy treatment.  She does continue to have some trouble with brain fog and itching and rash but her steroid creams are helping with this.  She reports she is walking daily weather permitting with her husband.  She reports also she takes extensive notes and uses a calendar because her mental acuity has been fading.  She notes that otherwise she has been in good health in good spirits.  She has had no recent runny nose, sore throat, cough.  She denies any fevers, chills, sweats.  Overall she is willing and able to proceed with Kyprolis  and dexamethasone  therapy at this time.  She is willing and able to continue for 3 weeks on and 1 week off, increasing the frequency of dosing due to the rising M protein.  Today I discussed my concern about the rising M protein and our limited remaining options.  She has been on numerous lines of therapy including Velcade , Revlimid , Pomalyst , Darzalex , and Ninlaro .  As she progresses on Kyprolis  we are not left with any additional options.  Bispecific and CAR-T therapies would not be preferred due to her advanced age, deconditioning, and difficulty with prior chemotherapy treatments.  I once again voiced my concern with her and she understands our findings and recommendations.  MEDICAL HISTORY:  Past Medical History:  Diagnosis Date   Allergic rhinitis    Arthritis    Colitis, collagenous    Diverticular disease    severe sig tics/fixation 2012   Family hx of colon cancer    Fracture of femoral neck, right (HCC) 01/09/2013   GERD (gastroesophageal reflux disease)     exacerbation of reflux-like symptoms 04/2011   Hiatal hernia    Hyperlipidemia    muliplt myelom 2020   Osteoarthritis of right hip 01/10/2013    SURGICAL HISTORY: Past Surgical History:  Procedure Laterality Date   CHOLECYSTECTOMY     EYE SURGERY  feb 2016   cataract right eye   LEG SURGERY Left    TONSILLECTOMY     TOTAL HIP ARTHROPLASTY Right 01/10/2013   Procedure: TOTAL HIP ARTHROPLASTY;  Surgeon: Nicole SHAUNNA Olmsted, MD;  Location: [facility] OR;  Service: Orthopedics;  Laterality: Right;    SOCIAL HISTORY: Social History   Socioeconomic History   Marital status: Married    Spouse name: Not on file   Number of children: 0   Years of education: Not on file   Highest education level: Some college, no degree  Occupational History   Occupation: retired  Tobacco Use   Smoking status: Former    Current packs/day: 0.00    Types: Cigarettes    Quit date: 01/10/1972    Years since quitting: 52.3   Smokeless tobacco: Never   Tobacco comments:    quit 1973  Vaping Use   Vaping status: Never Used  Substance and Sexual Activity   Alcohol use: No   Drug use: No   Sexual activity: Not on file  Other Topics Concern   Not on file  Social History Narrative   Married, no children. Drinks 2 cups decaf coffee a day. Walks daily. Is active, plays golf, she and her husband go hiking frequently. 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   Social Drivers of Corporate investment banker Strain: Not on file  Food Insecurity: No Food Insecurity (01/17/2023)   Hunger Vital Sign    Worried About Running Out of Food in the Last Year: Never true    Ran Out of Food in the Last Year: Never true  Transportation Needs: No Transportation Needs (01/17/2023)   PRAPARE - Administrator, Civil Service (Medical): No    Lack of Transportation (Non-Medical): No  Physical Activity: Not on file  Stress: Not on file  Social Connections: Not on file  Intimate Partner Violence: Not At Risk  (01/17/2023)   Humiliation, Afraid, Rape, and Kick questionnaire    Fear of Current or Ex-Partner: No    Emotionally Abused: No    Physically Abused: No    Sexually Abused: No    FAMILY HISTORY: Family History  Problem Relation Age of Onset   Congestive Heart Failure Mother    Arthritis-Osteo Mother    Colon cancer Mother    Heart attack Father    Rheum arthritis Father    Colon cancer Maternal Aunt    Colon cancer Maternal Aunt    Hypothyroidism Sister     ALLERGIES:  is allergic to kyprolis  [carfilzomib ], pomalyst  [pomalidomide ], revlimid  [lenalidomide ], betadine [povidone iodine], flagyl [metronidazole], fosamax [alendronate sodium], bactrim [sulfamethoxazole-trimethoprim], clindamycin/lincomycin, doxepin hcl, hydrocodone -acetaminophen , hydroxyzine, hydroxyzine hcl, tramadol hcl, valium [diazepam], actonel [risedronate sodium], boniva [ibandronate], doxycycline, and penicillins.  MEDICATIONS:  Current Outpatient Medications  Medication Sig Dispense Refill   acetaminophen  (TYLENOL ) 500 MG tablet Take  1,000 mg by mouth See admin instructions. Take 1,000 mg by mouth after breakfast, at 3:30 PM, and at 8:30 PM     acyclovir  (ZOVIRAX ) 400 MG tablet Take 1 tablet (400 mg total) by mouth 2 (two) times daily. 60 tablet 3   albuterol  (VENTOLIN  HFA) 108 (90 Base) MCG/ACT inhaler SMARTSIG:1 Puff(s) By Mouth Every 4-6 Hours PRN     augmented betamethasone dipropionate (DIPROLENE-AF) 0.05 % cream SMARTSIG:sparingly Topical Twice Daily PRN     bismuth subsalicylate (PEPTO BISMOL) 262 MG/15ML suspension Take 30 mLs by mouth every 6 (six) hours as needed.     famotidine  (PEPCID ) 20 MG tablet Take 1 tablet (20 mg total) by mouth daily. 30 tablet 0   famotidine -calcium carbonate-magnesium hydroxide (PEPCID  COMPLETE) 10-800-165 MG chewable tablet Chew 1 tablet by mouth daily as needed (for heartburn or indigestion).      guaiFENesin  (MUCINEX ) 600 MG 12 hr tablet Take 1 tablet (600 mg total) by mouth 2  (two) times daily. 40 tablet 0   hydrocortisone 2.5 % cream Apply topically 2 (two) times daily.     loperamide  (IMODIUM ) 2 MG capsule Take 1 capsule (2 mg total) by mouth 3 (three) times daily between meals as needed for diarrhea or loose stools. 30 capsule 1   triamcinolone  cream (KENALOG ) 0.1 % Apply 1 Application topically 2 (two) times daily. (Patient taking differently: Apply 1 Application topically 2 (two) times daily as needed (for allergic flares- affected areas).) 453.6 g 2   VASELINE PURE ULTRA WHITE ointment Apply 1 application  topically See admin instructions. Apply to the genitalia in the morning and at bedtime     No current facility-administered medications for this visit.    REVIEW OF SYSTEMS:   Constitutional: ( - ) fevers, ( - )  chills , ( - ) night sweats Eyes: ( - ) blurriness of vision, ( - ) double vision, ( - ) watery eyes Ears, nose, mouth, throat, and face: ( - ) mucositis, ( - ) sore throat Respiratory: ( - ) cough, ( - ) dyspnea, ( - ) wheezes Cardiovascular: ( - ) palpitation, ( - ) chest discomfort, ( - ) lower extremity swelling Gastrointestinal:  ( - ) nausea, ( - ) heartburn, ( - ) change in bowel habits Skin: ( +) abnormal skin rashes Lymphatics: ( - ) new lymphadenopathy, ( - ) easy bruising Neurological: ( - ) numbness, ( - ) tingling, ( - ) new weaknesses Behavioral/Psych: ( - ) mood change, ( - ) new changes  All other systems were reviewed with the patient and are negative.  PHYSICAL EXAMINATION: ECOG PERFORMANCE STATUS: 1 - Symptomatic but completely ambulatory  There were no vitals filed for this visit.   There were no vitals filed for this visit.   GENERAL: well appearing female in NAD  SKIN: skin color, texture, turgor are normal. Diffuse, flat erythematous rash.  EYES: conjunctiva are pink and non-injected, sclera clear LUNGS: clear to auscultation and percussion with normal breathing effort HEART: regular rate & rhythm and no murmurs  and no lower extremity edema Musculoskeletal: no cyanosis of digits and no clubbing  PSYCH: alert & oriented x 3, fluent speech NEURO: no focal motor/sensory deficits  LABORATORY DATA:  I have reviewed the data as listed    Latest Ref Rng & Units 04/06/2024   10:18 AM 03/23/2024    9:46 AM 03/10/2024   10:10 AM  CBC  WBC 4.0 - 10.5 K/uL 8.7  9.5  8.3  Hemoglobin 12.0 - 15.0 g/dL 86.7  87.1  86.7   Hematocrit 36.0 - 46.0 % 40.3  37.6  39.2   Platelets 150 - 400 K/uL 451  404  442        Latest Ref Rng & Units 04/06/2024   10:18 AM 03/23/2024    9:46 AM 03/10/2024   10:10 AM  CMP  Glucose 70 - 99 mg/dL 899  894  891   BUN 8 - 23 mg/dL 9  10  12    Creatinine 0.44 - 1.00 mg/dL 9.35  9.42  9.38   Sodium 135 - 145 mmol/L 139  137  138   Potassium 3.5 - 5.1 mmol/L 4.3  4.2  4.2   Chloride 98 - 111 mmol/L 105  106  104   CO2 22 - 32 mmol/L 28  26  28    Calcium 8.9 - 10.3 mg/dL 8.9  8.6  8.8   Total Protein 6.5 - 8.1 g/dL 6.3  6.2  6.3   Total Bilirubin 0.0 - 1.2 mg/dL 1.0  0.8  0.7   Alkaline Phos 38 - 126 U/L 55  49  45   AST 15 - 41 U/L 20  20  19    ALT 0 - 44 U/L 14  14  12      ASSESSMENT & PLAN AMOS GABER is a 83 y.o.female who presents to the clinic for follow up for multiple myeloma.  #IgG Lambda Multiple Myeloma: --Initially presented in April 2016 with M spike of 3.2 g/dL and IgG level of 5314. Underwent bone marrow biopsy from 11/20/2014 show 29% plasma cells. 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  --In December 2019 with rising M spike of 5.9 g/dL and worsening anemia, with Hgb 8.7, she met criteria for transformation of active multiple myeloma.  --From 07/27/2018-09/2018, received weekly velcade  plus dexamethasone . Therapy changed due to poor response.  --In 09/2018, started Revlimid  25 mg for 21 days on, 7 days off of a 28 day cycle with dexamethasone . Therapy was held after 2 cycles because of severe dermatological toxicity --In 12/2018,  switched to Pomalyst  1 mg daily for 21 days.  She completed 4 cycles of therapy and therapy was held due to occurrence of dermatological toxicity.  --In 07/2019, switch to Ninlaro  4 mg weekly with dexamethasone  20 mg weekly for 3 weeks on and 1 week off. Daratumumab  was addd in April 2021. Ninlaro  was discontinued in November 2021. --On 05/18/2020, started monthly daratumumab  and transitioned to q 3 month schedule in April 2022.  --On 11/20/2022, switch to Kyprolis /Dex due to rising M-protein and bone survey that showed worsening lytic lesions. --Switched to biweekly infusion of Kyprolis /Dex per patient request  as she wants to prioritize her quality of life PLAN:   --Due for cycle 17, Day 15 today. --Labs today show white blood cell count 9.6, hemoglobin 12.4, MCV 99, platelets 436.  Creatinine and LFTs in range.  --Most recent myeloma labs from 04/06/2024 showed M protein 0.9 --Discussed the rising M protein with the patient today. Reviewed options including trying to revert back to weekly Kyprolis  schedule versus best supportive care. Patient agreed to try weekly Kyprolis  starting Cycle 18.  --Proceed with treatment today without any dose modifications.  --RTC in 2 weeks for continuation of Kyprolis /Dex   #H/O near syncopal episodes:  --Suspect hypoglycemia as patient reports not eating the day of the episode.  --Encouraged patient to eat small, frequent meals   #Diffuse erythematous rash-stable --Managed by dermatology.  Patient prescribed 3 different steroid creams with instructions on how to use them which areas to apply. -- nystatin  powder for groin area.   #Supportive Care -- port not required but can be placed if requested.  -- zofran  8mg  q8H PRN and compazine  10mg  PO q6H for nausea -- acyclovir  400mg  PO BID for VCZ prophylaxis -- no pain medication required at this time.  -- discussed role for zometa  therapy in the setting her lytic lesions. Explained zometa  is given q 12 weeks and  requires a dental clearance.  After discussing this with the patient further today she knows she would like to hold on this for the time being as she is afraid of the side effects.  Strongly emphasized that she should reconsider as this will be helpful with her lytic bone lesions.  No orders of the defined types were placed in this encounter.  All questions were answered. The patient knows to call the clinic with any problems, questions or concerns.  I have spent a total of 30 minutes minutes of face-to-face and non-face-to-face time, preparing to see the patient,  performing a medically appropriate examination, counseling and educating the patient, ordering medications/tests/procedures,documenting clinical information in the electronic health record, and care coordination.   Norleen IVAR Kidney, MD Department of Hematology/Oncology [facility] at [facility] Phone: [phone] Pager: [phone] Email: [email or web address]

## 2024-04-20 NOTE — Patient Instructions (Signed)
 [facility] - A DEPT OF [facility]  Discharge Instructions: Thank you for choosing [facility] to provide your oncology and hematology care.   If you have a lab appointment with the Cancer Center, please go directly to the Cancer Center and check in at the registration area.   Wear comfortable clothing and clothing appropriate for easy access to any Portacath or PICC line.   We strive to give you quality time with your provider. You may need to reschedule your appointment if you arrive late (15 or more minutes).  Arriving late affects you and other patients whose appointments are after yours.  Also, if you miss three or more appointments without notifying the office, you may be dismissed from the clinic at the provider's discretion.      For prescription refill requests, have your pharmacy contact our office and allow 72 hours for refills to be completed.    Today you received the following chemotherapy and/or immunotherapy agents kyprolis      To help prevent nausea and vomiting after your treatment, we encourage you to take your nausea medication as directed.  BELOW ARE SYMPTOMS THAT SHOULD BE REPORTED IMMEDIATELY: *FEVER GREATER THAN 100.4 F (38 C) OR HIGHER *CHILLS OR SWEATING *NAUSEA AND VOMITING THAT IS NOT CONTROLLED WITH YOUR NAUSEA MEDICATION *UNUSUAL SHORTNESS OF BREATH *UNUSUAL BRUISING OR BLEEDING *URINARY PROBLEMS (pain or burning when urinating, or frequent urination) *BOWEL PROBLEMS (unusual diarrhea, constipation, pain near the anus) TENDERNESS IN MOUTH AND THROAT WITH OR WITHOUT PRESENCE OF ULCERS (sore throat, sores in mouth, or a toothache) UNUSUAL RASH, SWELLING OR PAIN  UNUSUAL VAGINAL DISCHARGE OR ITCHING   Items with * indicate a potential emergency and should be followed up as soon as possible or go to the Emergency Department if any problems should occur.  Please show the CHEMOTHERAPY ALERT CARD or IMMUNOTHERAPY  ALERT CARD at check-in to the Emergency Department and triage nurse.  Should you have questions after your visit or need to cancel or reschedule your appointment, please contact [facility] - A DEPT OF [facility]  Dept: [phone]  and follow the prompts.  Office hours are 8:00 a.m. to 4:30 p.m. Monday - Friday. Please note that voicemails left after 4:00 p.m. may not be returned until the following business day.  We are closed weekends and major holidays. You have access to a nurse at all times for urgent questions. Please call the main number to the clinic Dept: [phone] and follow the prompts.   For any non-urgent questions, you may also contact your provider using MyChart. We now offer e-Visits for anyone 80 and older to request care online for non-urgent symptoms. For details visit [email or web address].   Also download the MyChart app! Go to the app store, search "MyChart", open the app, select [facility], and log in with your MyChart username and password.

## 2024-04-22 ENCOUNTER — Other Ambulatory Visit: Payer: Self-pay

## 2024-04-23 ENCOUNTER — Encounter: Payer: Self-pay | Admitting: Hematology and Oncology

## 2024-05-03 MED FILL — Dexamethasone Sodium Phosphate Inj 100 MG/10ML: INTRAMUSCULAR | Qty: 4 | Status: AC

## 2024-05-04 ENCOUNTER — Encounter: Payer: Self-pay | Admitting: Hematology and Oncology

## 2024-05-04 ENCOUNTER — Inpatient Hospital Stay (HOSPITAL_BASED_OUTPATIENT_CLINIC_OR_DEPARTMENT_OTHER): Admitting: Physician Assistant

## 2024-05-04 ENCOUNTER — Inpatient Hospital Stay

## 2024-05-04 VITALS — BP 140/54 | HR 70 | Temp 98.8°F | Resp 14

## 2024-05-04 VITALS — BP 130/80 | HR 67 | Temp 98.1°F | Resp 13 | Wt 124.1 lb

## 2024-05-04 DIAGNOSIS — C9000 Multiple myeloma not having achieved remission: Secondary | ICD-10-CM

## 2024-05-04 DIAGNOSIS — Z79899 Other long term (current) drug therapy: Secondary | ICD-10-CM | POA: Diagnosis not present

## 2024-05-04 DIAGNOSIS — Z5112 Encounter for antineoplastic immunotherapy: Secondary | ICD-10-CM | POA: Diagnosis not present

## 2024-05-04 LAB — CBC WITH DIFFERENTIAL (CANCER CENTER ONLY)
Abs Immature Granulocytes: 0.05 K/uL (ref 0.00–0.07)
Basophils Absolute: 0.1 K/uL (ref 0.0–0.1)
Basophils Relative: 1 %
Eosinophils Absolute: 0.4 K/uL (ref 0.0–0.5)
Eosinophils Relative: 4 %
HCT: 39.9 % (ref 36.0–46.0)
Hemoglobin: 13.3 g/dL (ref 12.0–15.0)
Immature Granulocytes: 1 %
Lymphocytes Relative: 16 %
Lymphs Abs: 1.7 K/uL (ref 0.7–4.0)
MCH: 32.8 pg (ref 26.0–34.0)
MCHC: 33.3 g/dL (ref 30.0–36.0)
MCV: 98.5 fL (ref 80.0–100.0)
Monocytes Absolute: 0.9 K/uL (ref 0.1–1.0)
Monocytes Relative: 8 %
Neutro Abs: 7.7 K/uL (ref 1.7–7.7)
Neutrophils Relative %: 70 %
Platelet Count: 428 K/uL — ABNORMAL HIGH (ref 150–400)
RBC: 4.05 MIL/uL (ref 3.87–5.11)
RDW: 12.8 % (ref 11.5–15.5)
WBC Count: 10.7 K/uL — ABNORMAL HIGH (ref 4.0–10.5)
nRBC: 0 % (ref 0.0–0.2)

## 2024-05-04 LAB — CMP (CANCER CENTER ONLY)
ALT: 25 U/L (ref 0–44)
AST: 30 U/L (ref 15–41)
Albumin: 4.1 g/dL (ref 3.5–5.0)
Alkaline Phosphatase: 52 U/L (ref 38–126)
Anion gap: 4 — ABNORMAL LOW (ref 5–15)
BUN: 10 mg/dL (ref 8–23)
CO2: 30 mmol/L (ref 22–32)
Calcium: 9.2 mg/dL (ref 8.9–10.3)
Chloride: 105 mmol/L (ref 98–111)
Creatinine: 0.67 mg/dL (ref 0.44–1.00)
GFR, Estimated: >60 mL/min (ref >60)
Glucose, Bld: 89 mg/dL (ref 70–99)
Potassium: 4.2 mmol/L (ref 3.5–5.1)
Sodium: 139 mmol/L (ref 135–145)
Total Bilirubin: 0.6 mg/dL (ref 0.0–1.2)
Total Protein: 6.8 g/dL (ref 6.5–8.1)

## 2024-05-04 MED ORDER — DEXTROSE 5 % IV SOLN
70.0000 mg/m2 | Freq: Once | INTRAVENOUS | Status: AC
  Administered 2024-05-04: 120 mg via INTRAVENOUS
  Filled 2024-05-04: qty 60

## 2024-05-04 MED ORDER — SODIUM CHLORIDE 0.9 % IV SOLN: Freq: Once | INTRAVENOUS | Status: AC

## 2024-05-04 MED ORDER — SODIUM CHLORIDE 0.9 % IV SOLN
40.0000 mg | Freq: Once | INTRAVENOUS | Status: AC
  Administered 2024-05-04: 40 mg via INTRAVENOUS
  Filled 2024-05-04: qty 4

## 2024-05-04 MED ORDER — SODIUM CHLORIDE 0.9 % IV SOLN: Freq: Once | INTRAVENOUS | Status: DC

## 2024-05-04 NOTE — Patient Instructions (Signed)
 [facility] - A DEPT OF [facility]   Discharge Instructions: Thank you for choosing [facility] to provide your oncology and hematology care.   If you have a lab appointment with the Cancer Center, please go directly to the Cancer Center and check in at the registration area.   Wear comfortable clothing and clothing appropriate for easy access to any Portacath or PICC line.   We strive to give you quality time with your provider. You may need to reschedule your appointment if you arrive late (15 or more minutes).  Arriving late affects you and other patients whose appointments are after yours.  Also, if you miss three or more appointments without notifying the office, you may be dismissed from the clinic at the provider's discretion.      For prescription refill requests, have your pharmacy contact our office and allow 72 hours for refills to be completed.    Today you received the following chemotherapy and/or immunotherapy agents: Carfilzomib (Kyprolis)      To help prevent nausea and vomiting after your treatment, we encourage you to take your nausea medication as directed.  BELOW ARE SYMPTOMS THAT SHOULD BE REPORTED IMMEDIATELY: *FEVER GREATER THAN 100.4 F (38 C) OR HIGHER *CHILLS OR SWEATING *NAUSEA AND VOMITING THAT IS NOT CONTROLLED WITH YOUR NAUSEA MEDICATION *UNUSUAL SHORTNESS OF BREATH *UNUSUAL BRUISING OR BLEEDING *URINARY PROBLEMS (pain or burning when urinating, or frequent urination) *BOWEL PROBLEMS (unusual diarrhea, constipation, pain near the anus) TENDERNESS IN MOUTH AND THROAT WITH OR WITHOUT PRESENCE OF ULCERS (sore throat, sores in mouth, or a toothache) UNUSUAL RASH, SWELLING OR PAIN  UNUSUAL VAGINAL DISCHARGE OR ITCHING   Items with * indicate a potential emergency and should be followed up as soon as possible or go to the Emergency Department if any problems should occur.  Please show the CHEMOTHERAPY ALERT CARD or  IMMUNOTHERAPY ALERT CARD at check-in to the Emergency Department and triage nurse.  Should you have questions after your visit or need to cancel or reschedule your appointment, please contact [facility] - A DEPT [facility]  Dept: [phone]  and follow the prompts.  Office hours are 8:00 a.m. to 4:30 p.m. Monday - Friday. Please note that voicemails left after 4:00 p.m. may not be returned until the following business day.  We are closed weekends and major holidays. You have access to a nurse at all times for urgent questions. Please call the main number to the clinic Dept: [phone] and follow the prompts.   For any non-urgent questions, you may also contact your provider using MyChart. We now offer e-Visits for anyone 55 and older to request care online for non-urgent symptoms. For details visit [email or web address].   Also download the MyChart app! Go to the app store, search "MyChart", open the app, select [place], and log in with your MyChart username and password.

## 2024-05-04 NOTE — Progress Notes (Signed)
 [facility] [phone]   [phone]  PROGRESS NOTE  Patient Care Team: Kip Righter, MD as PCP - General (Family Medicine)  CHIEF COMPLAINTS/PURPOSE OF CONSULTATION:  IgG Lambda Multiple Myeloma  ONCOLOGIC HISTORY: 07/27/2018-09/2018: Received weekly velcade  plus dexamethasone . Therapy changed due to poor response.  09/2018: Started Revlimid  25 mg for 21 days on, 7 days off of a 28 day cycle with dexamethasone . Therapy was held after 2 cycles because of severe dermatological toxicity 12/2018: Pomalyst  1 mg daily for 21 days.  She completed 4 cycles of therapy and therapy was held due to occurrence of dermatological toxicity.  07/2019: Started Ninlaro  4 mg weekly with dexamethasone  20 mg weekly for 3 weeks on and 1 week off. Daratumumab  was addd in April 2021. Ninlaro  was discontinued in November 2021. 05/18/2020: Started monthly daratumumab  and transitioned to q 3 month schedule in April 2022.  11/06/2022: Darzalex  discontinued due to progression of disease. M protein 0.9 with worsening lytic lesions in the skeleton.  11/20/2022: Cycle 1 Day 1 of Kyprolis /Dex therapy.  12/19/2022: Cycle 2 Day 1 of Kyprolis /Dex therapy 01/15/2023: Cycle 3 Day 1 of Kyprolis /Dex therapy 02/19/2023: Cycle 4 Day 1 of Kyprolis /Dex therapy 03/18/2023: Cycle 5 Day 1 of Kyprolis /Dex therapy 04/25/2023: Cycle 6 Day 1 of Kyprolis /Dex therapy 05/28/2023: Cycle 7 Day 1 of Kyprolis /Dex therapy 06/11/2023: Cycle 8 Day 1 of Kyprolis /Dex therapy 07/08/2023 Cycle 9 Day 1 of Kyprolis /Dex therapy 09/11/2023 Cycle 10 Day 1 of Kyprolis /Dex therapy 10/09/2023: Cycle 11 Day 1 of Kyprolis /Dex therapy 11/05/2023: Cycle 12 Day 1 of Kyprolis /Dex therapy 12/02/2023: Cycle 13 Day 1 of Kyprolis /Dex therapy 01/17/2024: Cycle 14 Day 1 of Kyprolis /Dex therapy 02/10/2024: Cycle 15 Day 1 of Kyprolis /Dex therapy 03/10/2024: Cycle 16 Day 1 of Kyprolis /Dex therapy 04/06/2024: : Cycle 17 Day 1 of Kyprolis /Dex  therapy 05/04/2024: Cycle 18 Day 1 of Kyprolis /Dex therapy  HISTORY OF PRESENTING ILLNESS:  Nicole Keith 83 y.o. female returns for a follow up for IgG lambda multiple myeloma. She was last seen on 04/06/2024. She presents today for Cycle 18, Day 1 of Kyprolis /Dex.   On exam today, Nicole Keith reports she has been well overall in the interim since her last visit.  She is trying to stay active by walking one mile per day with her husband. She reports her appetite and weight are overall unchanged. She denies nausea, vomiting or bowel habit changes. She does have a rash with her treatment that she managed with topic steroid creams. She denies fevers, chills,sweats, shortness of breath, chest pain or cough. She has no other complaints. Rest of the ROS is below.   MEDICAL HISTORY:  Past Medical History:  Diagnosis Date   Allergic rhinitis    Arthritis    Colitis, collagenous    Diverticular disease    severe sig tics/fixation 2012   Family hx of colon cancer    Fracture of femoral neck, right (HCC) 01/09/2013   GERD (gastroesophageal reflux disease)    exacerbation of reflux-like symptoms 04/2011   Hiatal hernia    Hyperlipidemia    muliplt myelom 2020   Osteoarthritis of right hip 01/10/2013    SURGICAL HISTORY: Past Surgical History:  Procedure Laterality Date   CHOLECYSTECTOMY     EYE SURGERY  feb 2016   cataract right eye   LEG SURGERY Left    TONSILLECTOMY     TOTAL HIP ARTHROPLASTY Right 01/10/2013   Procedure: TOTAL HIP ARTHROPLASTY;  Surgeon: Fonda SHAUNNA Olmsted, MD;  Location: [facility] OR;  Service: Orthopedics;  Laterality: Right;    SOCIAL HISTORY: Social History   Socioeconomic History   Marital status: Married    Spouse name: Not on file   Number of children: 0   Years of education: Not on file   Highest education level: Some college, no degree  Occupational History   Occupation: retired  Tobacco Use   Smoking status: Former    Current packs/day: 0.00    Types:  Cigarettes    Quit date: 01/10/1972    Years since quitting: 52.3   Smokeless tobacco: Never   Tobacco comments:    quit 1973  Vaping Use   Vaping status: Never Used  Substance and Sexual Activity   Alcohol use: No   Drug use: No   Sexual activity: Not on file  Other Topics Concern   Not on file  Social History Narrative   Married, no children. Drinks 2 cups decaf coffee a day. Walks daily. Is active, plays golf, she and her husband go hiking frequently. 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   Social Drivers of Corporate investment banker Strain: Not on file  Food Insecurity: No Food Insecurity (01/17/2023)   Hunger Vital Sign    Worried About Running Out of Food in the Last Year: Never true    Ran Out of Food in the Last Year: Never true  Transportation Needs: No Transportation Needs (01/17/2023)   PRAPARE - Administrator, Civil Service (Medical): No    Lack of Transportation (Non-Medical): No  Physical Activity: Not on file  Stress: Not on file  Social Connections: Not on file  Intimate Partner Violence: Not At Risk (01/17/2023)   Humiliation, Afraid, Rape, and Kick questionnaire    Fear of Current or Ex-Partner: No    Emotionally Abused: No    Physically Abused: No    Sexually Abused: No    FAMILY HISTORY: Family History  Problem Relation Age of Onset   Congestive Heart Failure Mother    Arthritis-Osteo Mother    Colon cancer Mother    Heart attack Father    Rheum arthritis Father    Colon cancer Maternal Aunt    Colon cancer Maternal Aunt    Hypothyroidism Sister     ALLERGIES:  is allergic to kyprolis  [carfilzomib ], pomalyst  [pomalidomide ], revlimid  [lenalidomide ], betadine [povidone iodine], flagyl [metronidazole], fosamax [alendronate sodium], bactrim [sulfamethoxazole-trimethoprim], clindamycin/lincomycin, doxepin hcl, hydrocodone -acetaminophen , hydroxyzine, hydroxyzine hcl, tramadol hcl, valium [diazepam], actonel [risedronate  sodium], boniva [ibandronate], doxycycline, and penicillins.  MEDICATIONS:  Current Outpatient Medications  Medication Sig Dispense Refill   acetaminophen  (TYLENOL ) 500 MG tablet Take 1,000 mg by mouth See admin instructions. Take 1,000 mg by mouth after breakfast, at 3:30 PM, and at 8:30 PM     acyclovir  (ZOVIRAX ) 400 MG tablet Take 1 tablet (400 mg total) by mouth 2 (two) times daily. 60 tablet 3   albuterol  (VENTOLIN  HFA) 108 (90 Base) MCG/ACT inhaler SMARTSIG:1 Puff(s) By Mouth Every 4-6 Hours PRN     augmented betamethasone dipropionate (DIPROLENE-AF) 0.05 % cream SMARTSIG:sparingly Topical Twice Daily PRN     bismuth subsalicylate (PEPTO BISMOL) 262 MG/15ML suspension Take 30 mLs by mouth every 6 (six) hours as needed.     famotidine  (PEPCID ) 20 MG tablet Take 1 tablet (20 mg total) by mouth daily. 30 tablet 0   famotidine -calcium carbonate-magnesium hydroxide (PEPCID  COMPLETE) 10-800-165 MG chewable tablet Chew 1 tablet by mouth daily as needed (for heartburn or indigestion).  guaiFENesin  (MUCINEX ) 600 MG 12 hr tablet Take 1 tablet (600 mg total) by mouth 2 (two) times daily. 40 tablet 0   hydrocortisone 2.5 % cream Apply topically 2 (two) times daily.     loperamide  (IMODIUM ) 2 MG capsule Take 1 capsule (2 mg total) by mouth 3 (three) times daily between meals as needed for diarrhea or loose stools. 30 capsule 1   triamcinolone  cream (KENALOG ) 0.1 % Apply 1 Application topically 2 (two) times daily. (Patient taking differently: Apply 1 Application topically 2 (two) times daily as needed (for allergic flares- affected areas).) 453.6 g 2   VASELINE PURE ULTRA WHITE ointment Apply 1 application  topically See admin instructions. Apply to the genitalia in the morning and at bedtime     rosuvastatin (CRESTOR) 5 MG tablet Take 5 mg by mouth daily.     No current facility-administered medications for this visit.    REVIEW OF SYSTEMS:   Constitutional: ( - ) fevers, ( - )  chills , ( - )  night sweats Eyes: ( - ) blurriness of vision, ( - ) double vision, ( - ) watery eyes Ears, nose, mouth, throat, and face: ( - ) mucositis, ( - ) sore throat Respiratory: ( - ) cough, ( - ) dyspnea, ( - ) wheezes Cardiovascular: ( - ) palpitation, ( - ) chest discomfort, ( - ) lower extremity swelling Gastrointestinal:  ( - ) nausea, ( - ) heartburn, ( - ) change in bowel habits Skin: ( +) abnormal skin rashes Lymphatics: ( - ) new lymphadenopathy, ( - ) easy bruising Neurological: ( - ) numbness, ( - ) tingling, ( - ) new weaknesses Behavioral/Psych: ( - ) mood change, ( - ) new changes  All other systems were reviewed with the patient and are negative.  PHYSICAL EXAMINATION: ECOG PERFORMANCE STATUS: 1 - Symptomatic but completely ambulatory  Vitals:   05/04/24 1119  BP: 130/80  Pulse: 67  Resp: 13  Temp: 98.1 F (36.7 C)  SpO2: 96%     Filed Weights   05/04/24 1119  Weight: 124 lb 1.6 oz (56.3 kg)     GENERAL: well appearing female in NAD  SKIN: skin color, texture, turgor are normal. Diffuse, flat erythematous rash.  EYES: conjunctiva are pink and non-injected, sclera clear LUNGS: clear to auscultation and percussion with normal breathing effort HEART: regular rate & rhythm and no murmurs and no lower extremity edema Musculoskeletal: no cyanosis of digits and no clubbing  PSYCH: alert & oriented x 3, fluent speech NEURO: no focal motor/sensory deficits  LABORATORY DATA:  I have reviewed the data as listed    Latest Ref Rng & Units 05/04/2024   10:47 AM 04/20/2024   10:32 AM 04/06/2024   10:18 AM  CBC  WBC 4.0 - 10.5 K/uL 10.7  9.6  8.7   Hemoglobin 12.0 - 15.0 g/dL 86.6  87.5  86.7   Hematocrit 36.0 - 46.0 % 39.9  38.0  40.3   Platelets 150 - 400 K/uL 428  436  451        Latest Ref Rng & Units 05/04/2024   10:47 AM 04/20/2024   10:32 AM 04/06/2024   10:18 AM  CMP  Glucose 70 - 99 mg/dL 89  892  899   BUN 8 - 23 mg/dL 10  7  9    Creatinine 0.44 - 1.00 mg/dL  9.32  9.33  9.35   Sodium 135 - 145 mmol/L 139  138  139   Potassium 3.5 - 5.1 mmol/L 4.2  3.9  4.3   Chloride 98 - 111 mmol/L 105  104  105   CO2 22 - 32 mmol/L 30  29  28    Calcium 8.9 - 10.3 mg/dL 9.2  8.9  8.9   Total Protein 6.5 - 8.1 g/dL 6.8  6.1  6.3   Total Bilirubin 0.0 - 1.2 mg/dL 0.6  0.5  1.0   Alkaline Phos 38 - 126 U/L 52  51  55   AST 15 - 41 U/L 30  20  20    ALT 0 - 44 U/L 25  18  14      ASSESSMENT & PLAN TONIESHA ZELLNER is a 83 y.o.female who presents to the clinic for follow up for multiple myeloma.  #IgG Lambda Multiple Myeloma: --Initially presented in April 2016 with M spike of 3.2 g/dL and IgG level of 5314. Underwent bone marrow biopsy from 11/20/2014 show 29% plasma cells. 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  --In December 2019 with rising M spike of 5.9 g/dL and worsening anemia, with Hgb 8.7, she met criteria for transformation of active multiple myeloma.  --From 07/27/2018-09/2018, received weekly velcade  plus dexamethasone . Therapy changed due to poor response.  --In 09/2018, started Revlimid  25 mg for 21 days on, 7 days off of a 28 day cycle with dexamethasone . Therapy was held after 2 cycles because of severe dermatological toxicity --In 12/2018, switched to Pomalyst  1 mg daily for 21 days.  She completed 4 cycles of therapy and therapy was held due to occurrence of dermatological toxicity.  --In 07/2019, switch to Ninlaro  4 mg weekly with dexamethasone  20 mg weekly for 3 weeks on and 1 week off. Daratumumab  was addd in April 2021. Ninlaro  was discontinued in November 2021. --On 05/18/2020, started monthly daratumumab  and transitioned to q 3 month schedule in April 2022.  --On 11/20/2022, switch to Kyprolis /Dex due to rising M-protein and bone survey that showed worsening lytic lesions. --Switched to biweekly infusion of Kyprolis /Dex per patient request  as she wants to prioritize her quality of life PLAN:   --Due for cycle 18, Day 1  today. --Labs today show white blood cell count 10.7, hemoglobin 13.3, MCV 98.5, platelets 428.  Creatinine and LFTs in range.  --Most recent myeloma labs from 04/06/2024 showed M protein 0.9 --Discussed the rising M protein with the patient today. Reviewed options including trying to revert back to weekly Kyprolis  schedule versus best supportive care. Patient agreed to try weekly Kyprolis  starting this cycle.  --Proceed with treatment today without any dose modifications.  --RTC in 2 weeks for next toxicity check  #H/O near syncopal episodes:  --Suspect hypoglycemia as patient reports not eating the day of the episode.  --Encouraged patient to eat small, frequent meals   #Diffuse erythematous rash-stable --Managed by dermatology.  Patient prescribed 3 different steroid creams with instructions on how to use them which areas to apply. -- nystatin  powder for groin area.   #Supportive Care -- port not required but can be placed if requested.  -- zofran  8mg  q8H PRN and compazine  10mg  PO q6H for nausea -- acyclovir  400mg  PO BID for VCZ prophylaxis -- no pain medication required at this time.  -- discussed role for zometa  therapy in the setting her lytic lesions. Explained zometa  is given q 12 weeks and requires a dental clearance.  After discussing this with the patient further today she knows she would like to hold on this for the  time being as she is afraid of the side effects.  Strongly emphasized that she should reconsider as this will be helpful with her lytic bone lesions.  No orders of the defined types were placed in this encounter.  All questions were answered. The patient knows to call the clinic with any problems, questions or concerns.  I have spent a total of 30 minutes minutes of face-to-face and non-face-to-face time, preparing to see the patient,  performing a medically appropriate examination, counseling and educating the patient, ordering  medications/tests/procedures,documenting clinical information in the electronic health record, and care coordination.   Johnston Police PA-C Dept of Hematology and Oncology [facility] Phone: [phone]

## 2024-05-05 LAB — KAPPA/LAMBDA LIGHT CHAINS
Kappa free light chain: 1.8 mg/L — ABNORMAL LOW (ref 3.3–19.4)
Kappa, lambda light chain ratio: 0.05 — ABNORMAL LOW (ref 0.26–1.65)
Lambda free light chains: 40 mg/L — ABNORMAL HIGH (ref 5.7–26.3)

## 2024-05-07 LAB — MULTIPLE MYELOMA PANEL, SERUM
Albumin SerPl Elph-Mcnc: 3.4 g/dL (ref 2.9–4.4)
Albumin/Glob SerPl: 1.3 (ref 0.7–1.7)
Alpha 1: 0.3 g/dL (ref 0.0–0.4)
Alpha2 Glob SerPl Elph-Mcnc: 0.6 g/dL (ref 0.4–1.0)
B-Globulin SerPl Elph-Mcnc: 0.7 g/dL (ref 0.7–1.3)
Gamma Glob SerPl Elph-Mcnc: 1.1 g/dL (ref 0.4–1.8)
Globulin, Total: 2.7 g/dL (ref 2.2–3.9)
IgA: 9 mg/dL — ABNORMAL LOW (ref 64–422)
IgG (Immunoglobin G), Serum: 1245 mg/dL (ref 586–1602)
IgM (Immunoglobulin M), Srm: 7 mg/dL — ABNORMAL LOW (ref 26–217)
M Protein SerPl Elph-Mcnc: 0.9 g/dL — ABNORMAL HIGH
Total Protein ELP: 6.1 g/dL (ref 6.0–8.5)

## 2024-05-10 MED FILL — Dexamethasone Sodium Phosphate Inj 100 MG/10ML: INTRAMUSCULAR | Qty: 4 | Status: AC

## 2024-05-11 ENCOUNTER — Ambulatory Visit: Admitting: Physician Assistant

## 2024-05-11 ENCOUNTER — Other Ambulatory Visit: Payer: Self-pay | Admitting: Physician Assistant

## 2024-05-11 ENCOUNTER — Inpatient Hospital Stay

## 2024-05-11 ENCOUNTER — Inpatient Hospital Stay: Attending: Physician Assistant

## 2024-05-11 VITALS — BP 125/83 | HR 62 | Temp 98.4°F | Resp 15

## 2024-05-11 DIAGNOSIS — Z79899 Other long term (current) drug therapy: Secondary | ICD-10-CM | POA: Insufficient documentation

## 2024-05-11 DIAGNOSIS — C9000 Multiple myeloma not having achieved remission: Secondary | ICD-10-CM

## 2024-05-11 DIAGNOSIS — Z5112 Encounter for antineoplastic immunotherapy: Secondary | ICD-10-CM | POA: Diagnosis present

## 2024-05-11 LAB — CBC WITH DIFFERENTIAL (CANCER CENTER ONLY)
Abs Immature Granulocytes: 0.05 K/uL (ref 0.00–0.07)
Basophils Absolute: 0 K/uL (ref 0.0–0.1)
Basophils Relative: 1 %
Eosinophils Absolute: 0.5 K/uL (ref 0.0–0.5)
Eosinophils Relative: 5 %
HCT: 37.7 % (ref 36.0–46.0)
Hemoglobin: 12.8 g/dL (ref 12.0–15.0)
Immature Granulocytes: 1 %
Lymphocytes Relative: 12 %
Lymphs Abs: 1.1 K/uL (ref 0.7–4.0)
MCH: 33 pg (ref 26.0–34.0)
MCHC: 34 g/dL (ref 30.0–36.0)
MCV: 97.2 fL (ref 80.0–100.0)
Monocytes Absolute: 0.9 K/uL (ref 0.1–1.0)
Monocytes Relative: 11 %
Neutro Abs: 6.3 K/uL (ref 1.7–7.7)
Neutrophils Relative %: 70 %
Platelet Count: 209 K/uL (ref 150–400)
RBC: 3.88 MIL/uL (ref 3.87–5.11)
RDW: 12.8 % (ref 11.5–15.5)
WBC Count: 8.8 K/uL (ref 4.0–10.5)
nRBC: 0 % (ref 0.0–0.2)

## 2024-05-11 LAB — CMP (CANCER CENTER ONLY)
ALT: 22 U/L (ref 0–44)
AST: 20 U/L (ref 15–41)
Albumin: 3.8 g/dL (ref 3.5–5.0)
Alkaline Phosphatase: 52 U/L (ref 38–126)
Anion gap: 6 (ref 5–15)
BUN: 10 mg/dL (ref 8–23)
CO2: 27 mmol/L (ref 22–32)
Calcium: 9.1 mg/dL (ref 8.9–10.3)
Chloride: 105 mmol/L (ref 98–111)
Creatinine: 0.66 mg/dL (ref 0.44–1.00)
GFR, Estimated: >60 mL/min (ref >60)
Glucose, Bld: 139 mg/dL — ABNORMAL HIGH (ref 70–99)
Potassium: 3.9 mmol/L (ref 3.5–5.1)
Sodium: 138 mmol/L (ref 135–145)
Total Bilirubin: 0.6 mg/dL (ref 0.0–1.2)
Total Protein: 6.2 g/dL — ABNORMAL LOW (ref 6.5–8.1)

## 2024-05-11 MED ORDER — SODIUM CHLORIDE 0.9 % IV SOLN: Freq: Once | INTRAVENOUS | Status: AC

## 2024-05-11 MED ORDER — SODIUM CHLORIDE 0.9 % IV SOLN
40.0000 mg | Freq: Once | INTRAVENOUS | Status: AC
  Administered 2024-05-11: 40 mg via INTRAVENOUS
  Filled 2024-05-11: qty 4

## 2024-05-11 MED ORDER — DEXTROSE 5 % IV SOLN
70.0000 mg/m2 | Freq: Once | INTRAVENOUS | Status: AC
  Administered 2024-05-11: 120 mg via INTRAVENOUS
  Filled 2024-05-11: qty 60

## 2024-05-11 NOTE — Patient Instructions (Signed)
 [facility] - A DEPT OF [facility]  Discharge Instructions: Thank you for choosing [facility] to provide your oncology and hematology care.   If you have a lab appointment with the Cancer Center, please go directly to the Cancer Center and check in at the registration area.   Wear comfortable clothing and clothing appropriate for easy access to any Portacath or PICC line.   We strive to give you quality time with your provider. You may need to reschedule your appointment if you arrive late (15 or more minutes).  Arriving late affects you and other patients whose appointments are after yours.  Also, if you miss three or more appointments without notifying the office, you may be dismissed from the clinic at the provider's discretion.      For prescription refill requests, have your pharmacy contact our office and allow 72 hours for refills to be completed.    Today you received the following chemotherapy and/or immunotherapy agents kyprolis      To help prevent nausea and vomiting after your treatment, we encourage you to take your nausea medication as directed.  BELOW ARE SYMPTOMS THAT SHOULD BE REPORTED IMMEDIATELY: *FEVER GREATER THAN 100.4 F (38 C) OR HIGHER *CHILLS OR SWEATING *NAUSEA AND VOMITING THAT IS NOT CONTROLLED WITH YOUR NAUSEA MEDICATION *UNUSUAL SHORTNESS OF BREATH *UNUSUAL BRUISING OR BLEEDING *URINARY PROBLEMS (pain or burning when urinating, or frequent urination) *BOWEL PROBLEMS (unusual diarrhea, constipation, pain near the anus) TENDERNESS IN MOUTH AND THROAT WITH OR WITHOUT PRESENCE OF ULCERS (sore throat, sores in mouth, or a toothache) UNUSUAL RASH, SWELLING OR PAIN  UNUSUAL VAGINAL DISCHARGE OR ITCHING   Items with * indicate a potential emergency and should be followed up as soon as possible or go to the Emergency Department if any problems should occur.  Please show the CHEMOTHERAPY ALERT CARD or IMMUNOTHERAPY  ALERT CARD at check-in to the Emergency Department and triage nurse.  Should you have questions after your visit or need to cancel or reschedule your appointment, please contact [facility] - A DEPT OF [facility]  Dept: [phone]  and follow the prompts.  Office hours are 8:00 a.m. to 4:30 p.m. Monday - Friday. Please note that voicemails left after 4:00 p.m. may not be returned until the following business day.  We are closed weekends and major holidays. You have access to a nurse at all times for urgent questions. Please call the main number to the clinic Dept: [phone] and follow the prompts.   For any non-urgent questions, you may also contact your provider using MyChart. We now offer e-Visits for anyone 80 and older to request care online for non-urgent symptoms. For details visit [email or web address].   Also download the MyChart app! Go to the app store, search "MyChart", open the app, select [facility], and log in with your MyChart username and password.

## 2024-05-12 LAB — KAPPA/LAMBDA LIGHT CHAINS
Kappa free light chain: 1.2 mg/L — ABNORMAL LOW (ref 3.3–19.4)
Kappa, lambda light chain ratio: 0.03 — ABNORMAL LOW (ref 0.26–1.65)
Lambda free light chains: 35.1 mg/L — ABNORMAL HIGH (ref 5.7–26.3)

## 2024-05-16 LAB — MULTIPLE MYELOMA PANEL, SERUM
Albumin SerPl Elph-Mcnc: 3.2 g/dL (ref 2.9–4.4)
Albumin/Glob SerPl: 1.2 (ref 0.7–1.7)
Alpha 1: 0.3 g/dL (ref 0.0–0.4)
Alpha2 Glob SerPl Elph-Mcnc: 0.6 g/dL (ref 0.4–1.0)
B-Globulin SerPl Elph-Mcnc: 0.8 g/dL (ref 0.7–1.3)
Gamma Glob SerPl Elph-Mcnc: 1 g/dL (ref 0.4–1.8)
Globulin, Total: 2.7 g/dL (ref 2.2–3.9)
IgA: 7 mg/dL — ABNORMAL LOW (ref 64–422)
IgG (Immunoglobin G), Serum: 1114 mg/dL (ref 586–1602)
IgM (Immunoglobulin M), Srm: 6 mg/dL — ABNORMAL LOW (ref 26–217)
M Protein SerPl Elph-Mcnc: 0.9 g/dL — ABNORMAL HIGH
Total Protein ELP: 5.9 g/dL — ABNORMAL LOW (ref 6.0–8.5)

## 2024-05-18 ENCOUNTER — Inpatient Hospital Stay: Admitting: Physician Assistant

## 2024-05-18 ENCOUNTER — Inpatient Hospital Stay

## 2024-05-18 VITALS — BP 145/62 | HR 63 | Temp 97.7°F | Resp 18 | Wt 125.6 lb

## 2024-05-18 DIAGNOSIS — C9000 Multiple myeloma not having achieved remission: Secondary | ICD-10-CM

## 2024-05-18 DIAGNOSIS — Z5112 Encounter for antineoplastic immunotherapy: Secondary | ICD-10-CM | POA: Diagnosis not present

## 2024-05-18 LAB — CMP (CANCER CENTER ONLY)
ALT: 21 U/L (ref 0–44)
AST: 24 U/L (ref 15–41)
Albumin: 3.8 g/dL (ref 3.5–5.0)
Alkaline Phosphatase: 51 U/L (ref 38–126)
Anion gap: 3 — ABNORMAL LOW (ref 5–15)
BUN: 10 mg/dL (ref 8–23)
CO2: 30 mmol/L (ref 22–32)
Calcium: 9.2 mg/dL (ref 8.9–10.3)
Chloride: 104 mmol/L (ref 98–111)
Creatinine: 0.57 mg/dL (ref 0.44–1.00)
GFR, Estimated: >60 mL/min (ref >60)
Glucose, Bld: 82 mg/dL (ref 70–99)
Potassium: 4.1 mmol/L (ref 3.5–5.1)
Sodium: 137 mmol/L (ref 135–145)
Total Bilirubin: 0.6 mg/dL (ref 0.0–1.2)
Total Protein: 6.4 g/dL — ABNORMAL LOW (ref 6.5–8.1)

## 2024-05-18 LAB — CBC WITH DIFFERENTIAL (CANCER CENTER ONLY)
Abs Immature Granulocytes: 0.15 K/uL — ABNORMAL HIGH (ref 0.00–0.07)
Basophils Absolute: 0.1 K/uL (ref 0.0–0.1)
Basophils Relative: 0 %
Eosinophils Absolute: 0.6 K/uL — ABNORMAL HIGH (ref 0.0–0.5)
Eosinophils Relative: 5 %
HCT: 36.5 % (ref 36.0–46.0)
Hemoglobin: 12.2 g/dL (ref 12.0–15.0)
Immature Granulocytes: 1 %
Lymphocytes Relative: 13 %
Lymphs Abs: 1.4 K/uL (ref 0.7–4.0)
MCH: 32.7 pg (ref 26.0–34.0)
MCHC: 33.4 g/dL (ref 30.0–36.0)
MCV: 97.9 fL (ref 80.0–100.0)
Monocytes Absolute: 1.3 K/uL — ABNORMAL HIGH (ref 0.1–1.0)
Monocytes Relative: 12 %
Neutro Abs: 7.7 K/uL (ref 1.7–7.7)
Neutrophils Relative %: 69 %
Platelet Count: 248 K/uL (ref 150–400)
RBC: 3.73 MIL/uL — ABNORMAL LOW (ref 3.87–5.11)
RDW: 13 % (ref 11.5–15.5)
WBC Count: 11.1 K/uL — ABNORMAL HIGH (ref 4.0–10.5)
nRBC: 0 % (ref 0.0–0.2)

## 2024-05-18 MED ORDER — SODIUM CHLORIDE 0.9 % IV SOLN: Freq: Once | INTRAVENOUS | Status: AC

## 2024-05-18 MED ORDER — SODIUM CHLORIDE 0.9 % IV SOLN
40.0000 mg | Freq: Once | INTRAVENOUS | Status: AC
  Administered 2024-05-18: 40 mg via INTRAVENOUS
  Filled 2024-05-18: qty 4

## 2024-05-18 MED ORDER — DEXTROSE 5 % IV SOLN
70.0000 mg/m2 | Freq: Once | INTRAVENOUS | Status: AC
  Administered 2024-05-18: 120 mg via INTRAVENOUS
  Filled 2024-05-18: qty 60

## 2024-05-18 NOTE — Patient Instructions (Signed)

## 2024-05-18 NOTE — Progress Notes (Signed)
 [facility] [phone]   [phone]  PROGRESS NOTE  Patient Care Team: Kip Righter, MD as PCP - General (Family Medicine)  CHIEF COMPLAINTS/PURPOSE OF CONSULTATION:  IgG Lambda Multiple Myeloma  ONCOLOGIC HISTORY: 07/27/2018-09/2018: Received weekly velcade  plus dexamethasone . Therapy changed due to poor response.  09/2018: Started Revlimid  25 mg for 21 days on, 7 days off of a 28 day cycle with dexamethasone . Therapy was held after 2 cycles because of severe dermatological toxicity 12/2018: Pomalyst  1 mg daily for 21 days.  She completed 4 cycles of therapy and therapy was held due to occurrence of dermatological toxicity.  07/2019: Started Ninlaro  4 mg weekly with dexamethasone  20 mg weekly for 3 weeks on and 1 week off. Daratumumab  was addd in April 2021. Ninlaro  was discontinued in November 2021. 05/18/2020: Started monthly daratumumab  and transitioned to q 3 month schedule in April 2022.  11/06/2022: Darzalex  discontinued due to progression of disease. M protein 0.9 with worsening lytic lesions in the skeleton.  11/20/2022: Cycle 1 Day 1 of Kyprolis /Dex therapy.  12/19/2022: Cycle 2 Day 1 of Kyprolis /Dex therapy 01/15/2023: Cycle 3 Day 1 of Kyprolis /Dex therapy 02/19/2023: Cycle 4 Day 1 of Kyprolis /Dex therapy 03/18/2023: Cycle 5 Day 1 of Kyprolis /Dex therapy 04/25/2023: Cycle 6 Day 1 of Kyprolis /Dex therapy 05/28/2023: Cycle 7 Day 1 of Kyprolis /Dex therapy 06/11/2023: Cycle 8 Day 1 of Kyprolis /Dex therapy 07/08/2023 Cycle 9 Day 1 of Kyprolis /Dex therapy 09/11/2023 Cycle 10 Day 1 of Kyprolis /Dex therapy 10/09/2023: Cycle 11 Day 1 of Kyprolis /Dex therapy 11/05/2023: Cycle 12 Day 1 of Kyprolis /Dex therapy 12/02/2023: Cycle 13 Day 1 of Kyprolis /Dex therapy 01/17/2024: Cycle 14 Day 1 of Kyprolis /Dex therapy 02/10/2024: Cycle 15 Day 1 of Kyprolis /Dex therapy 03/10/2024: Cycle 16 Day 1 of Kyprolis /Dex therapy 04/06/2024: : Cycle 17 Day 1 of Kyprolis /Dex  therapy 05/04/2024: Cycle 18 Day 1 of Kyprolis /Dex therapy  HISTORY OF PRESENTING ILLNESS:  Nicole Keith 83 y.o. female returns for a follow up for IgG lambda multiple myeloma. She was last seen on 05/04/2024. She presents today for Cycle 18, Day 15 of Kyprolis /Dex.   On exam today, Ms. Caridi reports she has been well overall in the interim since her last visit.  Her energy levels are fairly stable and she continues to walk daily with her husband.  She denies any appetite or weight changes.  She denies easy bruising or overt signs of bleeding such as hematochezia or melena.  Her rash after her treatment is unchanged and well-controlled with topical steroids. She denies fevers, chills,sweats, shortness of breath, chest pain, cough, nausea, vomiting or bowel habit changes.  She has no other complaints. Rest of the ROS is below.   MEDICAL HISTORY:  Past Medical History:  Diagnosis Date   Allergic rhinitis    Arthritis    Colitis, collagenous    Diverticular disease    severe sig tics/fixation 2012   Family hx of colon cancer    Fracture of femoral neck, right (HCC) 01/09/2013   GERD (gastroesophageal reflux disease)    exacerbation of reflux-like symptoms 04/2011   Hiatal hernia    Hyperlipidemia    muliplt myelom 2020   Osteoarthritis of right hip 01/10/2013    SURGICAL HISTORY: Past Surgical History:  Procedure Laterality Date   CHOLECYSTECTOMY     EYE SURGERY  feb 2016   cataract right eye   LEG SURGERY Left    TONSILLECTOMY     TOTAL HIP ARTHROPLASTY Right 01/10/2013   Procedure: TOTAL HIP ARTHROPLASTY;  Surgeon: Fonda SHAUNNA Olmsted, MD;  Location: [facility] OR;  Service: Orthopedics;  Laterality: Right;    SOCIAL HISTORY: Social History   Socioeconomic History   Marital status: Married    Spouse name: Not on file   Number of children: 0   Years of education: Not on file   Highest education level: Some college, no degree  Occupational History   Occupation: retired  Tobacco Use    Smoking status: Former    Current packs/day: 0.00    Types: Cigarettes    Quit date: 01/10/1972    Years since quitting: 52.3   Smokeless tobacco: Never   Tobacco comments:    quit 1973  Vaping Use   Vaping status: Never Used  Substance and Sexual Activity   Alcohol use: No   Drug use: No   Sexual activity: Not on file  Other Topics Concern   Not on file  Social History Narrative   Married, no children. Drinks 2 cups decaf coffee a day. Walks daily. Is active, plays golf, she and her husband go hiking frequently. 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   Social Drivers of Corporate investment banker Strain: Not on file  Food Insecurity: No Food Insecurity (01/17/2023)   Hunger Vital Sign    Worried About Running Out of Food in the Last Year: Never true    Ran Out of Food in the Last Year: Never true  Transportation Needs: No Transportation Needs (01/17/2023)   PRAPARE - Administrator, Civil Service (Medical): No    Lack of Transportation (Non-Medical): No  Physical Activity: Not on file  Stress: Not on file  Social Connections: Not on file  Intimate Partner Violence: Not At Risk (01/17/2023)   Humiliation, Afraid, Rape, and Kick questionnaire    Fear of Current or Ex-Partner: No    Emotionally Abused: No    Physically Abused: No    Sexually Abused: No    FAMILY HISTORY: Family History  Problem Relation Age of Onset   Congestive Heart Failure Mother    Arthritis-Osteo Mother    Colon cancer Mother    Heart attack Father    Rheum arthritis Father    Colon cancer Maternal Aunt    Colon cancer Maternal Aunt    Hypothyroidism Sister     ALLERGIES:  is allergic to kyprolis  [carfilzomib ], pomalyst  [pomalidomide ], revlimid  [lenalidomide ], betadine [povidone iodine], flagyl [metronidazole], fosamax [alendronate sodium], bactrim [sulfamethoxazole-trimethoprim], clindamycin/lincomycin, doxepin hcl, hydrocodone -acetaminophen , hydroxyzine, hydroxyzine  hcl, tramadol hcl, valium [diazepam], actonel [risedronate sodium], boniva [ibandronate], doxycycline, and penicillins.  MEDICATIONS:  Current Outpatient Medications  Medication Sig Dispense Refill   acetaminophen  (TYLENOL ) 500 MG tablet Take 1,000 mg by mouth See admin instructions. Take 1,000 mg by mouth after breakfast, at 3:30 PM, and at 8:30 PM     acyclovir  (ZOVIRAX ) 400 MG tablet Take 1 tablet (400 mg total) by mouth 2 (two) times daily. 60 tablet 3   albuterol  (VENTOLIN  HFA) 108 (90 Base) MCG/ACT inhaler SMARTSIG:1 Puff(s) By Mouth Every 4-6 Hours PRN     augmented betamethasone dipropionate (DIPROLENE-AF) 0.05 % cream SMARTSIG:sparingly Topical Twice Daily PRN     bismuth subsalicylate (PEPTO BISMOL) 262 MG/15ML suspension Take 30 mLs by mouth every 6 (six) hours as needed.     famotidine  (PEPCID ) 20 MG tablet Take 1 tablet (20 mg total) by mouth daily. 30 tablet 0   famotidine -calcium carbonate-magnesium hydroxide (PEPCID  COMPLETE) 10-800-165 MG chewable tablet Chew 1 tablet by mouth daily as  needed (for heartburn or indigestion).      guaiFENesin  (MUCINEX ) 600 MG 12 hr tablet Take 1 tablet (600 mg total) by mouth 2 (two) times daily. 40 tablet 0   hydrocortisone 2.5 % cream Apply topically 2 (two) times daily.     loperamide  (IMODIUM ) 2 MG capsule Take 1 capsule (2 mg total) by mouth 3 (three) times daily between meals as needed for diarrhea or loose stools. 30 capsule 1   rosuvastatin (CRESTOR) 5 MG tablet Take 5 mg by mouth daily.     triamcinolone  cream (KENALOG ) 0.1 % Apply 1 Application topically 2 (two) times daily. (Patient taking differently: Apply 1 Application topically 2 (two) times daily as needed (for allergic flares- affected areas).) 453.6 g 2   VASELINE PURE ULTRA WHITE ointment Apply 1 application  topically See admin instructions. Apply to the genitalia in the morning and at bedtime     No current facility-administered medications for this visit.    REVIEW OF  SYSTEMS:   Constitutional: ( - ) fevers, ( - )  chills , ( - ) night sweats Eyes: ( - ) blurriness of vision, ( - ) double vision, ( - ) watery eyes Ears, nose, mouth, throat, and face: ( - ) mucositis, ( - ) sore throat Respiratory: ( - ) cough, ( - ) dyspnea, ( - ) wheezes Cardiovascular: ( - ) palpitation, ( - ) chest discomfort, ( - ) lower extremity swelling Gastrointestinal:  ( - ) nausea, ( - ) heartburn, ( - ) change in bowel habits Skin: ( +) abnormal skin rashes Lymphatics: ( - ) new lymphadenopathy, ( - ) easy bruising Neurological: ( - ) numbness, ( - ) tingling, ( - ) new weaknesses Behavioral/Psych: ( - ) mood change, ( - ) new changes  All other systems were reviewed with the patient and are negative.  PHYSICAL EXAMINATION: ECOG PERFORMANCE STATUS: 1 - Symptomatic but completely ambulatory  Vitals:   05/18/24 1114  BP: (!) 145/62  Pulse: 63  Resp: 18  Temp: 97.7 F (36.5 C)  SpO2: 96%    Filed Weights   05/18/24 1114  Weight: 125 lb 9.6 oz (57 kg)   GENERAL: well appearing female in NAD  SKIN: skin color, texture, turgor are normal. Diffuse, flat erythematous rash.  EYES: conjunctiva are pink and non-injected, sclera clear LUNGS: clear to auscultation and percussion with normal breathing effort HEART: regular rate & rhythm and no murmurs and no lower extremity edema Musculoskeletal: no cyanosis of digits and no clubbing  PSYCH: alert & oriented x 3, fluent speech NEURO: no focal motor/sensory deficits  LABORATORY DATA:  I have reviewed the data as listed    Latest Ref Rng & Units 05/18/2024   10:37 AM 05/11/2024    8:45 AM 05/04/2024   10:47 AM  CBC  WBC 4.0 - 10.5 K/uL 11.1  8.8  10.7   Hemoglobin 12.0 - 15.0 g/dL 87.7  87.1  86.6   Hematocrit 36.0 - 46.0 % 36.5  37.7  39.9   Platelets 150 - 400 K/uL 248  209  428        Latest Ref Rng & Units 05/18/2024   10:37 AM 05/11/2024    8:45 AM 05/04/2024   10:47 AM  CMP  Glucose 70 - 99 mg/dL 82  860  89    BUN 8 - 23 mg/dL 10  10  10    Creatinine 0.44 - 1.00 mg/dL 9.42  9.33  9.32  Sodium 135 - 145 mmol/L 137  138  139   Potassium 3.5 - 5.1 mmol/L 4.1  3.9  4.2   Chloride 98 - 111 mmol/L 104  105  105   CO2 22 - 32 mmol/L 30  27  30    Calcium 8.9 - 10.3 mg/dL 9.2  9.1  9.2   Total Protein 6.5 - 8.1 g/dL 6.4  6.2  6.8   Total Bilirubin 0.0 - 1.2 mg/dL 0.6  0.6  0.6   Alkaline Phos 38 - 126 U/L 51  52  52   AST 15 - 41 U/L 24  20  30    ALT 0 - 44 U/L 21  22  25      ASSESSMENT & PLAN NINOSKA GOSWICK is a 83 y.o.female who presents to the clinic for follow up for multiple myeloma.  #IgG Lambda Multiple Myeloma: --Initially presented in April 2016 with M spike of 3.2 g/dL and IgG level of 5314. Underwent bone marrow biopsy from 11/20/2014 show 29% plasma cells. 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  --In December 2019 with rising M spike of 5.9 g/dL and worsening anemia, with Hgb 8.7, she met criteria for transformation of active multiple myeloma.  --From 07/27/2018-09/2018, received weekly velcade  plus dexamethasone . Therapy changed due to poor response.  --In 09/2018, started Revlimid  25 mg for 21 days on, 7 days off of a 28 day cycle with dexamethasone . Therapy was held after 2 cycles because of severe dermatological toxicity --In 12/2018, switched to Pomalyst  1 mg daily for 21 days.  She completed 4 cycles of therapy and therapy was held due to occurrence of dermatological toxicity.  --In 07/2019, switch to Ninlaro  4 mg weekly with dexamethasone  20 mg weekly for 3 weeks on and 1 week off. Daratumumab  was addd in April 2021. Ninlaro  was discontinued in November 2021. --On 05/18/2020, started monthly daratumumab  and transitioned to q 3 month schedule in April 2022.  --On 11/20/2022, switch to Kyprolis /Dex due to rising M-protein and bone survey that showed worsening lytic lesions. --Switched to biweekly infusion of Kyprolis /Dex per patient request  as she wants to  prioritize her quality of life --Due to rising M protein with the patient. Reviewed options including trying to revert back to weekly Kyprolis  schedule versus best supportive care. Patient agreed to try weekly Kyprolis  starting this cycle.  PLAN:   --Due for cycle 18, Day 15 today. --Labs today show white blood cell count 11.1, hemoglobin 12.2, MCV 97.9, platelets 248.  Creatinine and LFTs in range.  --Most recent myeloma labs from 05/11/2024 showed M protein 0.9, lambda light chain 35.1.  --Proceed with treatment today without any dose modifications.  --RTC in 2 weeks for next toxicity check  #H/O near syncopal episodes:  --Suspect hypoglycemia as patient reports not eating the day of the episode.  --Encouraged patient to eat small, frequent meals   #Diffuse erythematous rash-stable --Managed by dermatology.  Patient prescribed 3 different steroid creams with instructions on how to use them which areas to apply. -- nystatin  powder for groin area.   #Supportive Care -- zofran  8mg  q8H PRN and compazine  10mg  PO q6H for nausea -- acyclovir  400mg  PO BID for VCZ prophylaxis -- discussed role for zometa  therapy in the setting her lytic lesions. Patient has declined Zometa  therapy.   No orders of the defined types were placed in this encounter.  All questions were answered. The patient knows to call the clinic with any problems, questions or concerns.  I have  spent a total of 30 minutes minutes of face-to-face and non-face-to-face time, preparing to see the patient,  performing a medically appropriate examination, counseling and educating the patient, ordering medications/tests/procedures,documenting clinical information in the electronic health record, and care coordination.   Johnston Police PA-C Dept of Hematology and Oncology [facility] at [facility] Phone: [phone]

## 2024-05-31 MED FILL — Dexamethasone Sodium Phosphate Inj 100 MG/10ML: INTRAMUSCULAR | Qty: 4 | Status: AC

## 2024-06-01 ENCOUNTER — Inpatient Hospital Stay

## 2024-06-01 ENCOUNTER — Inpatient Hospital Stay: Admitting: Physician Assistant

## 2024-06-01 VITALS — BP 134/71 | HR 64 | Temp 97.2°F | Resp 16 | Ht 66.0 in | Wt 125.0 lb

## 2024-06-01 DIAGNOSIS — C9000 Multiple myeloma not having achieved remission: Secondary | ICD-10-CM

## 2024-06-01 DIAGNOSIS — Z5112 Encounter for antineoplastic immunotherapy: Secondary | ICD-10-CM | POA: Diagnosis not present

## 2024-06-01 LAB — CMP (CANCER CENTER ONLY)
ALT: 27 U/L (ref 0–44)
AST: 31 U/L (ref 15–41)
Albumin: 3.9 g/dL (ref 3.5–5.0)
Alkaline Phosphatase: 63 U/L (ref 38–126)
Anion gap: 4 — ABNORMAL LOW (ref 5–15)
BUN: 8 mg/dL (ref 8–23)
CO2: 29 mmol/L (ref 22–32)
Calcium: 9.6 mg/dL (ref 8.9–10.3)
Chloride: 104 mmol/L (ref 98–111)
Creatinine: 0.62 mg/dL (ref 0.44–1.00)
GFR, Estimated: >60 mL/min (ref >60)
Glucose, Bld: 108 mg/dL — ABNORMAL HIGH (ref 70–99)
Potassium: 4.2 mmol/L (ref 3.5–5.1)
Sodium: 137 mmol/L (ref 135–145)
Total Bilirubin: 0.5 mg/dL (ref 0.0–1.2)
Total Protein: 6.4 g/dL — ABNORMAL LOW (ref 6.5–8.1)

## 2024-06-01 LAB — CBC WITH DIFFERENTIAL (CANCER CENTER ONLY)
Abs Immature Granulocytes: 0.09 K/uL — ABNORMAL HIGH (ref 0.00–0.07)
Basophils Absolute: 0.1 K/uL (ref 0.0–0.1)
Basophils Relative: 1 %
Eosinophils Absolute: 0.4 K/uL (ref 0.0–0.5)
Eosinophils Relative: 5 %
HCT: 38.4 % (ref 36.0–46.0)
Hemoglobin: 12.8 g/dL (ref 12.0–15.0)
Immature Granulocytes: 1 %
Lymphocytes Relative: 14 %
Lymphs Abs: 1.2 K/uL (ref 0.7–4.0)
MCH: 32.7 pg (ref 26.0–34.0)
MCHC: 33.3 g/dL (ref 30.0–36.0)
MCV: 98 fL (ref 80.0–100.0)
Monocytes Absolute: 1 K/uL (ref 0.1–1.0)
Monocytes Relative: 12 %
Neutro Abs: 6.1 K/uL (ref 1.7–7.7)
Neutrophils Relative %: 67 %
Platelet Count: 451 K/uL — ABNORMAL HIGH (ref 150–400)
RBC: 3.92 MIL/uL (ref 3.87–5.11)
RDW: 12.8 % (ref 11.5–15.5)
WBC Count: 9 K/uL (ref 4.0–10.5)
nRBC: 0 % (ref 0.0–0.2)

## 2024-06-01 MED ORDER — SODIUM CHLORIDE 0.9 % IV SOLN: Freq: Once | INTRAVENOUS | Status: AC

## 2024-06-01 MED ORDER — SODIUM CHLORIDE 0.9 % IV SOLN
40.0000 mg | Freq: Once | INTRAVENOUS | Status: AC
  Administered 2024-06-01: 40 mg via INTRAVENOUS
  Filled 2024-06-01: qty 4

## 2024-06-01 MED ORDER — DEXTROSE 5 % IV SOLN
70.0000 mg/m2 | Freq: Once | INTRAVENOUS | Status: AC
  Administered 2024-06-01: 120 mg via INTRAVENOUS
  Filled 2024-06-01: qty 60

## 2024-06-01 NOTE — Patient Instructions (Addendum)
 [facility] - A DEPT OF [facility]   Discharge Instructions: Thank you for choosing [facility] to provide your oncology and hematology care.   If you have a lab appointment with the Cancer Center, please go directly to the Cancer Center and check in at the registration area.   Wear comfortable clothing and clothing appropriate for easy access to any Portacath or PICC line.   We strive to give you quality time with your provider. You may need to reschedule your appointment if you arrive late (15 or more minutes).  Arriving late affects you and other patients whose appointments are after yours.  Also, if you miss three or more appointments without notifying the office, you may be dismissed from the clinic at the provider's discretion.      For prescription refill requests, have your pharmacy contact our office and allow 72 hours for refills to be completed.    Today you received the following chemotherapy and/or immunotherapy agents: Carfilzomib (Kyprolis)      To help prevent nausea and vomiting after your treatment, we encourage you to take your nausea medication as directed.  BELOW ARE SYMPTOMS THAT SHOULD BE REPORTED IMMEDIATELY: *FEVER GREATER THAN 100.4 F (38 C) OR HIGHER *CHILLS OR SWEATING *NAUSEA AND VOMITING THAT IS NOT CONTROLLED WITH YOUR NAUSEA MEDICATION *UNUSUAL SHORTNESS OF BREATH *UNUSUAL BRUISING OR BLEEDING *URINARY PROBLEMS (pain or burning when urinating, or frequent urination) *BOWEL PROBLEMS (unusual diarrhea, constipation, pain near the anus) TENDERNESS IN MOUTH AND THROAT WITH OR WITHOUT PRESENCE OF ULCERS (sore throat, sores in mouth, or a toothache) UNUSUAL RASH, SWELLING OR PAIN  UNUSUAL VAGINAL DISCHARGE OR ITCHING   Items with * indicate a potential emergency and should be followed up as soon as possible or go to the Emergency Department if any problems should occur.  Please show the CHEMOTHERAPY ALERT CARD or  IMMUNOTHERAPY ALERT CARD at check-in to the Emergency Department and triage nurse.  Should you have questions after your visit or need to cancel or reschedule your appointment, please contact [facility] - A DEPT [facility]  Dept: [phone]  and follow the prompts.  Office hours are 8:00 a.m. to 4:30 p.m. Monday - Friday. Please note that voicemails left after 4:00 p.m. may not be returned until the following business day.  We are closed weekends and major holidays. You have access to a nurse at all times for urgent questions. Please call the main number to the clinic Dept: [phone] and follow the prompts.   For any non-urgent questions, you may also contact your provider using MyChart. We now offer e-Visits for anyone 55 and older to request care online for non-urgent symptoms. For details visit [email or web address].   Also download the MyChart app! Go to the app store, search "MyChart", open the app, select [place], and log in with your MyChart username and password.

## 2024-06-01 NOTE — Progress Notes (Signed)
 [facility] [phone]   [phone]  PROGRESS NOTE  Patient Care Team: Kip Righter, MD as PCP - General (Family Medicine)  CHIEF COMPLAINTS/PURPOSE OF CONSULTATION:  IgG Lambda Multiple Myeloma  ONCOLOGIC HISTORY: 07/27/2018-09/2018: Received weekly velcade  plus dexamethasone . Therapy changed due to poor response.  09/2018: Started Revlimid  25 mg for 21 days on, 7 days off of a 28 day cycle with dexamethasone . Therapy was held after 2 cycles because of severe dermatological toxicity 12/2018: Pomalyst  1 mg daily for 21 days.  She completed 4 cycles of therapy and therapy was held due to occurrence of dermatological toxicity.  07/2019: Started Ninlaro  4 mg weekly with dexamethasone  20 mg weekly for 3 weeks on and 1 week off. Daratumumab  was addd in April 2021. Ninlaro  was discontinued in November 2021. 05/18/2020: Started monthly daratumumab  and transitioned to q 3 month schedule in April 2022.  11/06/2022: Darzalex  discontinued due to progression of disease. M protein 0.9 with worsening lytic lesions in the skeleton.  11/20/2022: Cycle 1 Day 1 of Kyprolis /Dex therapy.  12/19/2022: Cycle 2 Day 1 of Kyprolis /Dex therapy 01/15/2023: Cycle 3 Day 1 of Kyprolis /Dex therapy 02/19/2023: Cycle 4 Day 1 of Kyprolis /Dex therapy 03/18/2023: Cycle 5 Day 1 of Kyprolis /Dex therapy 04/25/2023: Cycle 6 Day 1 of Kyprolis /Dex therapy 05/28/2023: Cycle 7 Day 1 of Kyprolis /Dex therapy 06/11/2023: Cycle 8 Day 1 of Kyprolis /Dex therapy 07/08/2023 Cycle 9 Day 1 of Kyprolis /Dex therapy 09/11/2023 Cycle 10 Day 1 of Kyprolis /Dex therapy 10/09/2023: Cycle 11 Day 1 of Kyprolis /Dex therapy 11/05/2023: Cycle 12 Day 1 of Kyprolis /Dex therapy 12/02/2023: Cycle 13 Day 1 of Kyprolis /Dex therapy 01/17/2024: Cycle 14 Day 1 of Kyprolis /Dex therapy 02/10/2024: Cycle 15 Day 1 of Kyprolis /Dex therapy 03/10/2024: Cycle 16 Day 1 of Kyprolis /Dex therapy 04/06/2024: : Cycle 17 Day 1 of Kyprolis /Dex  therapy 05/04/2024: Cycle 18 Day 1 of Kyprolis /Dex therapy 06/01/2024: Cycle 19 Day 1 of Kyprolis /Dex therapy  HISTORY OF PRESENTING ILLNESS:  Nicole Keith 83 y.o. female returns for a follow up for IgG lambda multiple myeloma. She was last seen on 05/18/2024. She presents today for Cycle 19, Day 1 of Kyprolis /Dex.   On exam today, Ms. Haynesworth reports she is tolerating her treatment without any new or concerning symptoms.  Her energy is fairly stable and she can complete her baseline daily activities on her own.  She denies nausea, vomiting or bowel habit changes.  She denies easy bruising or signs of active bleeding such as hematochezia or melena.  Her treatment related rash is stable and managed with topical steroids as prescribed.  She denies any new bone or back pain.She denies fevers, chills,sweats, shortness of breath, chest pain or cough.  She has no other complaints. Rest of the ROS is below.   MEDICAL HISTORY:  Past Medical History:  Diagnosis Date   Allergic rhinitis    Arthritis    Colitis, collagenous    Diverticular disease    severe sig tics/fixation 2012   Family hx of colon cancer    Fracture of femoral neck, right (HCC) 01/09/2013   GERD (gastroesophageal reflux disease)    exacerbation of reflux-like symptoms 04/2011   Hiatal hernia    Hyperlipidemia    muliplt myelom 2020   Osteoarthritis of right hip 01/10/2013    SURGICAL HISTORY: Past Surgical History:  Procedure Laterality Date   CHOLECYSTECTOMY     EYE SURGERY  feb 2016   cataract right eye   LEG SURGERY Left    TONSILLECTOMY  TOTAL HIP ARTHROPLASTY Right 01/10/2013   Procedure: TOTAL HIP ARTHROPLASTY;  Surgeon: Fonda SHAUNNA Olmsted, MD;  Location: [facility] OR;  Service: Orthopedics;  Laterality: Right;    SOCIAL HISTORY: Social History   Socioeconomic History   Marital status: Married    Spouse name: Not on file   Number of children: 0   Years of education: Not on file   Highest education level: Some  college, no degree  Occupational History   Occupation: retired  Tobacco Use   Smoking status: Former    Current packs/day: 0.00    Types: Cigarettes    Quit date: 01/10/1972    Years since quitting: 52.4   Smokeless tobacco: Never   Tobacco comments:    quit 1973  Vaping Use   Vaping status: Never Used  Substance and Sexual Activity   Alcohol use: No   Drug use: No   Sexual activity: Not on file  Other Topics Concern   Not on file  Social History Narrative   Married, no children. Drinks 2 cups decaf coffee a day. Walks daily. Is active, plays golf, she and her husband go hiking frequently. 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   Social Drivers of Corporate investment banker Strain: Not on file  Food Insecurity: No Food Insecurity (01/17/2023)   Hunger Vital Sign    Worried About Running Out of Food in the Last Year: Never true    Ran Out of Food in the Last Year: Never true  Transportation Needs: No Transportation Needs (01/17/2023)   PRAPARE - Administrator, Civil Service (Medical): No    Lack of Transportation (Non-Medical): No  Physical Activity: Not on file  Stress: Not on file  Social Connections: Not on file  Intimate Partner Violence: Not At Risk (01/17/2023)   Humiliation, Afraid, Rape, and Kick questionnaire    Fear of Current or Ex-Partner: No    Emotionally Abused: No    Physically Abused: No    Sexually Abused: No    FAMILY HISTORY: Family History  Problem Relation Age of Onset   Congestive Heart Failure Mother    Arthritis-Osteo Mother    Colon cancer Mother    Heart attack Father    Rheum arthritis Father    Colon cancer Maternal Aunt    Colon cancer Maternal Aunt    Hypothyroidism Sister     ALLERGIES:  is allergic to kyprolis  [carfilzomib ], pomalyst  [pomalidomide ], revlimid  [lenalidomide ], betadine [povidone iodine], flagyl [metronidazole], fosamax [alendronate sodium], bactrim [sulfamethoxazole-trimethoprim],  clindamycin/lincomycin, doxepin hcl, hydrocodone -acetaminophen , hydroxyzine, hydroxyzine hcl, tramadol hcl, valium [diazepam], actonel [risedronate sodium], boniva [ibandronate], doxycycline, and penicillins.  MEDICATIONS:  Current Outpatient Medications  Medication Sig Dispense Refill   acetaminophen  (TYLENOL ) 500 MG tablet Take 1,000 mg by mouth See admin instructions. Take 1,000 mg by mouth after breakfast, at 3:30 PM, and at 8:30 PM     acyclovir  (ZOVIRAX ) 400 MG tablet Take 1 tablet (400 mg total) by mouth 2 (two) times daily. 60 tablet 3   albuterol  (VENTOLIN  HFA) 108 (90 Base) MCG/ACT inhaler SMARTSIG:1 Puff(s) By Mouth Every 4-6 Hours PRN     augmented betamethasone dipropionate (DIPROLENE-AF) 0.05 % cream SMARTSIG:sparingly Topical Twice Daily PRN     bismuth subsalicylate (PEPTO BISMOL) 262 MG/15ML suspension Take 30 mLs by mouth every 6 (six) hours as needed.     famotidine  (PEPCID ) 20 MG tablet Take 1 tablet (20 mg total) by mouth daily. 30 tablet 0   famotidine -calcium carbonate-magnesium hydroxide (PEPCID   COMPLETE) 10-800-165 MG chewable tablet Chew 1 tablet by mouth daily as needed (for heartburn or indigestion).      guaiFENesin  (MUCINEX ) 600 MG 12 hr tablet Take 1 tablet (600 mg total) by mouth 2 (two) times daily. 40 tablet 0   hydrocortisone 2.5 % cream Apply topically 2 (two) times daily.     loperamide  (IMODIUM ) 2 MG capsule Take 1 capsule (2 mg total) by mouth 3 (three) times daily between meals as needed for diarrhea or loose stools. 30 capsule 1   rosuvastatin (CRESTOR) 5 MG tablet Take 5 mg by mouth daily.     triamcinolone  cream (KENALOG ) 0.1 % Apply 1 Application topically 2 (two) times daily. (Patient taking differently: Apply 1 Application topically 2 (two) times daily as needed (for allergic flares- affected areas).) 453.6 g 2   VASELINE PURE ULTRA WHITE ointment Apply 1 application  topically See admin instructions. Apply to the genitalia in the morning and at bedtime      No current facility-administered medications for this visit.    REVIEW OF SYSTEMS:   Constitutional: ( - ) fevers, ( - )  chills , ( - ) night sweats Eyes: ( - ) blurriness of vision, ( - ) double vision, ( - ) watery eyes Ears, nose, mouth, throat, and face: ( - ) mucositis, ( - ) sore throat Respiratory: ( - ) cough, ( - ) dyspnea, ( - ) wheezes Cardiovascular: ( - ) palpitation, ( - ) chest discomfort, ( - ) lower extremity swelling Gastrointestinal:  ( - ) nausea, ( - ) heartburn, ( - ) change in bowel habits Skin: ( +) abnormal skin rashes Lymphatics: ( - ) new lymphadenopathy, ( - ) easy bruising Neurological: ( - ) numbness, ( - ) tingling, ( - ) new weaknesses Behavioral/Psych: ( - ) mood change, ( - ) new changes  All other systems were reviewed with the patient and are negative.  PHYSICAL EXAMINATION: ECOG PERFORMANCE STATUS: 1 - Symptomatic but completely ambulatory  Vitals:   06/01/24 1112  BP: 134/71  Pulse: 64  Resp: 16  Temp: (!) 97.2 F (36.2 C)  SpO2: 97%    Filed Weights   06/01/24 1112  Weight: 125 lb (56.7 kg)   GENERAL: well appearing female in NAD  SKIN: skin color, texture, turgor are normal. Diffuse, flat erythematous rash.  EYES: conjunctiva are pink and non-injected, sclera clear LUNGS: clear to auscultation and percussion with normal breathing effort HEART: regular rate & rhythm and no murmurs and no lower extremity edema Musculoskeletal: no cyanosis of digits and no clubbing  PSYCH: alert & oriented x 3, fluent speech NEURO: no focal motor/sensory deficits  LABORATORY DATA:  I have reviewed the data as listed    Latest Ref Rng & Units 06/01/2024   10:32 AM 05/18/2024   10:37 AM 05/11/2024    8:45 AM  CBC  WBC 4.0 - 10.5 K/uL 9.0  11.1  8.8   Hemoglobin 12.0 - 15.0 g/dL 87.1  87.7  87.1   Hematocrit 36.0 - 46.0 % 38.4  36.5  37.7   Platelets 150 - 400 K/uL 451  248  209        Latest Ref Rng & Units 05/18/2024   10:37 AM 05/11/2024     8:45 AM 05/04/2024   10:47 AM  CMP  Glucose 70 - 99 mg/dL 82  860  89   BUN 8 - 23 mg/dL 10  10  10    Creatinine  0.44 - 1.00 mg/dL 9.42  9.33  9.32   Sodium 135 - 145 mmol/L 137  138  139   Potassium 3.5 - 5.1 mmol/L 4.1  3.9  4.2   Chloride 98 - 111 mmol/L 104  105  105   CO2 22 - 32 mmol/L 30  27  30    Calcium 8.9 - 10.3 mg/dL 9.2  9.1  9.2   Total Protein 6.5 - 8.1 g/dL 6.4  6.2  6.8   Total Bilirubin 0.0 - 1.2 mg/dL 0.6  0.6  0.6   Alkaline Phos 38 - 126 U/L 51  52  52   AST 15 - 41 U/L 24  20  30    ALT 0 - 44 U/L 21  22  25      ASSESSMENT & PLAN JOIE HIPPS is a 83 y.o.female who presents to the clinic for follow up for multiple myeloma.  #IgG Lambda Multiple Myeloma: --Initially presented in April 2016 with M spike of 3.2 g/dL and IgG level of 5314. Underwent bone marrow biopsy from 11/20/2014 show 29% plasma cells. 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  --In December 2019 with rising M spike of 5.9 g/dL and worsening anemia, with Hgb 8.7, she met criteria for transformation of active multiple myeloma.  --From 07/27/2018-09/2018, received weekly velcade  plus dexamethasone . Therapy changed due to poor response.  --In 09/2018, started Revlimid  25 mg for 21 days on, 7 days off of a 28 day cycle with dexamethasone . Therapy was held after 2 cycles because of severe dermatological toxicity --In 12/2018, switched to Pomalyst  1 mg daily for 21 days.  She completed 4 cycles of therapy and therapy was held due to occurrence of dermatological toxicity.  --In 07/2019, switch to Ninlaro  4 mg weekly with dexamethasone  20 mg weekly for 3 weeks on and 1 week off. Daratumumab  was addd in April 2021. Ninlaro  was discontinued in November 2021. --On 05/18/2020, started monthly daratumumab  and transitioned to q 3 month schedule in April 2022.  --On 11/20/2022, switch to Kyprolis /Dex due to rising M-protein and bone survey that showed worsening lytic lesions. --Switched to  biweekly infusion of Kyprolis /Dex per patient request  as she wants to prioritize her quality of life --Due to rising M protein with the patient. Reviewed options including trying to revert back to weekly Kyprolis  schedule versus best supportive care. Patient agreed to try weekly Kyprolis  starting this cycle.  PLAN:   --Due for cycle 19, Day 1 today. --Labs today show white blood cell count 9.0, hemoglobin 12.8, MCV 98.0, platelets 451.  Creatinine and LFTs in range.  --Most recent myeloma labs from 05/11/2024 showed M protein 0.9, lambda light chain 35.1.  --Proceed with treatment today without any dose modifications.  --RTC in 2 weeks for next toxicity check  #H/O near syncopal episodes:  --Suspect hypoglycemia as patient reports not eating the day of the episode.  --Encouraged patient to eat small, frequent meals   #Diffuse erythematous rash-stable --Managed by dermatology.  Patient prescribed 3 different steroid creams with instructions on how to use them which areas to apply. -- nystatin  powder for groin area.   #Supportive Care -- zofran  8mg  q8H PRN and compazine  10mg  PO q6H for nausea -- acyclovir  400mg  PO BID for VCZ prophylaxis -- discussed role for zometa  therapy in the setting her lytic lesions. Patient has declined Zometa  therapy.   No orders of the defined types were placed in this encounter.  All questions were answered. The patient knows to call  the clinic with any problems, questions or concerns.  I have spent a total of 30 minutes minutes of face-to-face and non-face-to-face time, preparing to see the patient,  performing a medically appropriate examination, counseling and educating the patient, ordering medications/tests/procedures,documenting clinical information in the electronic health record, and care coordination.   Johnston Police PA-C Dept of Hematology and Oncology [facility] Phone: [phone]

## 2024-06-02 LAB — KAPPA/LAMBDA LIGHT CHAINS
Kappa free light chain: 1.2 mg/L — ABNORMAL LOW (ref 3.3–19.4)
Kappa, lambda light chain ratio: 0.03 — ABNORMAL LOW (ref 0.26–1.65)
Lambda free light chains: 34.9 mg/L — ABNORMAL HIGH (ref 5.7–26.3)

## 2024-06-06 LAB — MULTIPLE MYELOMA PANEL, SERUM
Albumin SerPl Elph-Mcnc: 3.2 g/dL (ref 2.9–4.4)
Albumin/Glob SerPl: 1.2 (ref 0.7–1.7)
Alpha 1: 0.3 g/dL (ref 0.0–0.4)
Alpha2 Glob SerPl Elph-Mcnc: 0.6 g/dL (ref 0.4–1.0)
B-Globulin SerPl Elph-Mcnc: 0.8 g/dL (ref 0.7–1.3)
Gamma Glob SerPl Elph-Mcnc: 1.1 g/dL (ref 0.4–1.8)
Globulin, Total: 2.8 g/dL (ref 2.2–3.9)
IgA: <5 mg/dL — ABNORMAL LOW (ref 64–422)
IgG (Immunoglobin G), Serum: 1106 mg/dL (ref 586–1602)
IgM (Immunoglobulin M), Srm: 10 mg/dL — ABNORMAL LOW (ref 26–217)
M Protein SerPl Elph-Mcnc: 1 g/dL — ABNORMAL HIGH
Total Protein ELP: 6 g/dL (ref 6.0–8.5)

## 2024-06-07 MED FILL — Dexamethasone Sodium Phosphate Inj 100 MG/10ML: INTRAMUSCULAR | Qty: 4 | Status: AC

## 2024-06-08 ENCOUNTER — Inpatient Hospital Stay

## 2024-06-08 ENCOUNTER — Other Ambulatory Visit

## 2024-06-08 ENCOUNTER — Other Ambulatory Visit: Payer: Self-pay

## 2024-06-08 ENCOUNTER — Ambulatory Visit: Admitting: Hematology and Oncology

## 2024-06-08 ENCOUNTER — Ambulatory Visit

## 2024-06-08 VITALS — BP 154/73 | HR 61 | Temp 98.7°F | Wt 124.4 lb

## 2024-06-08 DIAGNOSIS — C9000 Multiple myeloma not having achieved remission: Secondary | ICD-10-CM

## 2024-06-08 DIAGNOSIS — Z5112 Encounter for antineoplastic immunotherapy: Secondary | ICD-10-CM | POA: Diagnosis not present

## 2024-06-08 LAB — CMP (CANCER CENTER ONLY)
ALT: 25 U/L (ref 0–44)
AST: 24 U/L (ref 15–41)
Albumin: 4 g/dL (ref 3.5–5.0)
Alkaline Phosphatase: 66 U/L (ref 38–126)
Anion gap: 5 (ref 5–15)
BUN: 10 mg/dL (ref 8–23)
CO2: 29 mmol/L (ref 22–32)
Calcium: 9.1 mg/dL (ref 8.9–10.3)
Chloride: 102 mmol/L (ref 98–111)
Creatinine: 0.63 mg/dL (ref 0.44–1.00)
GFR, Estimated: >60 mL/min (ref >60)
Glucose, Bld: 86 mg/dL (ref 70–99)
Potassium: 4 mmol/L (ref 3.5–5.1)
Sodium: 136 mmol/L (ref 135–145)
Total Bilirubin: 0.6 mg/dL (ref 0.0–1.2)
Total Protein: 6.6 g/dL (ref 6.5–8.1)

## 2024-06-08 LAB — CBC WITH DIFFERENTIAL (CANCER CENTER ONLY)
Abs Immature Granulocytes: 0.14 K/uL — ABNORMAL HIGH (ref 0.00–0.07)
Basophils Absolute: 0.1 K/uL (ref 0.0–0.1)
Basophils Relative: 1 %
Eosinophils Absolute: 0.7 K/uL — ABNORMAL HIGH (ref 0.0–0.5)
Eosinophils Relative: 8 %
HCT: 37.5 % (ref 36.0–46.0)
Hemoglobin: 12.1 g/dL (ref 12.0–15.0)
Immature Granulocytes: 2 %
Lymphocytes Relative: 16 %
Lymphs Abs: 1.4 K/uL (ref 0.7–4.0)
MCH: 32.3 pg (ref 26.0–34.0)
MCHC: 32.3 g/dL (ref 30.0–36.0)
MCV: 100 fL (ref 80.0–100.0)
Monocytes Absolute: 1.4 K/uL — ABNORMAL HIGH (ref 0.1–1.0)
Monocytes Relative: 15 %
Neutro Abs: 5.4 K/uL (ref 1.7–7.7)
Neutrophils Relative %: 58 %
Platelet Count: 191 K/uL (ref 150–400)
RBC: 3.75 MIL/uL — ABNORMAL LOW (ref 3.87–5.11)
RDW: 12.8 % (ref 11.5–15.5)
WBC Count: 9.2 K/uL (ref 4.0–10.5)
nRBC: 0 % (ref 0.0–0.2)

## 2024-06-08 MED ORDER — SODIUM CHLORIDE 0.9 % IV SOLN
40.0000 mg | Freq: Once | INTRAVENOUS | Status: AC
  Administered 2024-06-08: 40 mg via INTRAVENOUS
  Filled 2024-06-08: qty 4

## 2024-06-08 MED ORDER — DEXTROSE 5 % IV SOLN
70.0000 mg/m2 | Freq: Once | INTRAVENOUS | Status: AC
  Administered 2024-06-08: 120 mg via INTRAVENOUS
  Filled 2024-06-08: qty 60

## 2024-06-08 MED ORDER — SODIUM CHLORIDE 0.9 % IV SOLN: Freq: Once | INTRAVENOUS | Status: AC

## 2024-06-08 NOTE — Patient Instructions (Signed)
 [facility] - A DEPT OF [facility]   Discharge Instructions: Thank you for choosing [facility] to provide your oncology and hematology care.   If you have a lab appointment with the Cancer Center, please go directly to the Cancer Center and check in at the registration area.   Wear comfortable clothing and clothing appropriate for easy access to any Portacath or PICC line.   We strive to give you quality time with your provider. You may need to reschedule your appointment if you arrive late (15 or more minutes).  Arriving late affects you and other patients whose appointments are after yours.  Also, if you miss three or more appointments without notifying the office, you may be dismissed from the clinic at the provider's discretion.      For prescription refill requests, have your pharmacy contact our office and allow 72 hours for refills to be completed.    Today you received the following chemotherapy and/or immunotherapy agents: Carfilzomib (Kyprolis)      To help prevent nausea and vomiting after your treatment, we encourage you to take your nausea medication as directed.  BELOW ARE SYMPTOMS THAT SHOULD BE REPORTED IMMEDIATELY: *FEVER GREATER THAN 100.4 F (38 C) OR HIGHER *CHILLS OR SWEATING *NAUSEA AND VOMITING THAT IS NOT CONTROLLED WITH YOUR NAUSEA MEDICATION *UNUSUAL SHORTNESS OF BREATH *UNUSUAL BRUISING OR BLEEDING *URINARY PROBLEMS (pain or burning when urinating, or frequent urination) *BOWEL PROBLEMS (unusual diarrhea, constipation, pain near the anus) TENDERNESS IN MOUTH AND THROAT WITH OR WITHOUT PRESENCE OF ULCERS (sore throat, sores in mouth, or a toothache) UNUSUAL RASH, SWELLING OR PAIN  UNUSUAL VAGINAL DISCHARGE OR ITCHING   Items with * indicate a potential emergency and should be followed up as soon as possible or go to the Emergency Department if any problems should occur.  Please show the CHEMOTHERAPY ALERT CARD or  IMMUNOTHERAPY ALERT CARD at check-in to the Emergency Department and triage nurse.  Should you have questions after your visit or need to cancel or reschedule your appointment, please contact [facility] - A DEPT [facility]  Dept: [phone]  and follow the prompts.  Office hours are 8:00 a.m. to 4:30 p.m. Monday - Friday. Please note that voicemails left after 4:00 p.m. may not be returned until the following business day.  We are closed weekends and major holidays. You have access to a nurse at all times for urgent questions. Please call the main number to the clinic Dept: [phone] and follow the prompts.   For any non-urgent questions, you may also contact your provider using MyChart. We now offer e-Visits for anyone 55 and older to request care online for non-urgent symptoms. For details visit [email or web address].   Also download the MyChart app! Go to the app store, search "MyChart", open the app, select [place], and log in with your MyChart username and password.

## 2024-06-09 ENCOUNTER — Other Ambulatory Visit (HOSPITAL_COMMUNITY): Payer: Self-pay

## 2024-06-09 LAB — KAPPA/LAMBDA LIGHT CHAINS
Kappa free light chain: 1.2 mg/L — ABNORMAL LOW (ref 3.3–19.4)
Kappa, lambda light chain ratio: 0.03 — ABNORMAL LOW (ref 0.26–1.65)
Lambda free light chains: 34.9 mg/L — ABNORMAL HIGH (ref 5.7–26.3)

## 2024-06-10 ENCOUNTER — Other Ambulatory Visit (HOSPITAL_COMMUNITY): Payer: Self-pay

## 2024-06-13 LAB — MULTIPLE MYELOMA PANEL, SERUM
Albumin SerPl Elph-Mcnc: 3.4 g/dL (ref 2.9–4.4)
Albumin/Glob SerPl: 1.1 (ref 0.7–1.7)
Alpha 1: 0.3 g/dL (ref 0.0–0.4)
Alpha2 Glob SerPl Elph-Mcnc: 0.7 g/dL (ref 0.4–1.0)
B-Globulin SerPl Elph-Mcnc: 0.9 g/dL (ref 0.7–1.3)
Gamma Glob SerPl Elph-Mcnc: 1.2 g/dL (ref 0.4–1.8)
Globulin, Total: 3.1 g/dL (ref 2.2–3.9)
IgA: 7 mg/dL — ABNORMAL LOW (ref 64–422)
IgG (Immunoglobin G), Serum: 1114 mg/dL (ref 586–1602)
IgM (Immunoglobulin M), Srm: 8 mg/dL — ABNORMAL LOW (ref 26–217)
M Protein SerPl Elph-Mcnc: 1 g/dL — ABNORMAL HIGH
Total Protein ELP: 6.5 g/dL (ref 6.0–8.5)

## 2024-06-14 ENCOUNTER — Other Ambulatory Visit: Payer: Self-pay

## 2024-06-14 MED FILL — Dexamethasone Sodium Phosphate Inj 100 MG/10ML: INTRAMUSCULAR | Qty: 4 | Status: AC

## 2024-06-14 NOTE — Progress Notes (Unsigned)
 [facility] [phone]   [phone]  PROGRESS NOTE  Patient Care Team: Kip Righter, MD as PCP - General (Family Medicine)  CHIEF COMPLAINTS/PURPOSE OF CONSULTATION:  IgG Lambda Multiple Myeloma  ONCOLOGIC HISTORY: 07/27/2018-09/2018: Received weekly velcade  plus dexamethasone . Therapy changed due to poor response.  09/2018: Started Revlimid  25 mg for 21 days on, 7 days off of a 28 day cycle with dexamethasone . Therapy was held after 2 cycles because of severe dermatological toxicity 12/2018: Pomalyst  1 mg daily for 21 days.  She completed 4 cycles of therapy and therapy was held due to occurrence of dermatological toxicity.  07/2019: Started Ninlaro  4 mg weekly with dexamethasone  20 mg weekly for 3 weeks on and 1 week off. Daratumumab  was addd in April 2021. Ninlaro  was discontinued in November 2021. 05/18/2020: Started monthly daratumumab  and transitioned to q 3 month schedule in April 2022.  11/06/2022: Darzalex  discontinued due to progression of disease. M protein 0.9 with worsening lytic lesions in the skeleton.  11/20/2022: Cycle 1 Day 1 of Kyprolis /Dex therapy.  12/19/2022: Cycle 2 Day 1 of Kyprolis /Dex therapy 01/15/2023: Cycle 3 Day 1 of Kyprolis /Dex therapy 02/19/2023: Cycle 4 Day 1 of Kyprolis /Dex therapy 03/18/2023: Cycle 5 Day 1 of Kyprolis /Dex therapy 04/25/2023: Cycle 6 Day 1 of Kyprolis /Dex therapy 05/28/2023: Cycle 7 Day 1 of Kyprolis /Dex therapy 06/11/2023: Cycle 8 Day 1 of Kyprolis /Dex therapy 07/08/2023 Cycle 9 Day 1 of Kyprolis /Dex therapy 09/11/2023 Cycle 10 Day 1 of Kyprolis /Dex therapy 10/09/2023: Cycle 11 Day 1 of Kyprolis /Dex therapy 11/05/2023: Cycle 12 Day 1 of Kyprolis /Dex therapy 12/02/2023: Cycle 13 Day 1 of Kyprolis /Dex therapy 01/17/2024: Cycle 14 Day 1 of Kyprolis /Dex therapy 02/10/2024: Cycle 15 Day 1 of Kyprolis /Dex therapy 03/10/2024: Cycle 16 Day 1 of Kyprolis /Dex therapy 04/06/2024: Cycle 17 Day 1 of Kyprolis /Dex  therapy  HISTORY OF PRESENTING ILLNESS:  Nicole Keith 83 y.o. female returns for a follow up for IgG lambda multiple myeloma. She was last seen on 04/06/2024. She presents today for Cycle 17, Day 15 of Kyprolis /Dex.   On exam today, Nicole Keith reports she has been well overall in the interim since her last visit.  She reports she has not had any major negative effects as a result of her chemotherapy treatment.  She does continue to have some trouble with brain fog and itching and rash but her steroid creams are helping with this.  She reports she is walking daily weather permitting with her husband.  She reports also she takes extensive notes and uses a calendar because her mental acuity has been fading.  She notes that otherwise she has been in good health in good spirits.  She has had no recent runny nose, sore throat, cough.  She denies any fevers, chills, sweats.  Overall she is willing and able to proceed with Kyprolis  and dexamethasone  therapy at this time.  She is willing and able to continue for 3 weeks on and 1 week off, increasing the frequency of dosing due to the rising M protein.  Today I discussed my concern about the rising M protein and our limited remaining options.  She has been on numerous lines of therapy including Velcade , Revlimid , Pomalyst , Darzalex , and Ninlaro .  As she progresses on Kyprolis  we are not left with any additional options.  Bispecific and CAR-T therapies would not be preferred due to her advanced age, deconditioning, and difficulty with prior chemotherapy treatments.  I once again voiced my concern with her and she understands our findings and recommendations.  MEDICAL HISTORY:  Past Medical History:  Diagnosis Date   Allergic rhinitis    Arthritis    Colitis, collagenous    Diverticular disease    severe sig tics/fixation 2012   Family hx of colon cancer    Fracture of femoral neck, right (HCC) 01/09/2013   GERD (gastroesophageal reflux disease)     exacerbation of reflux-like symptoms 04/2011   Hiatal hernia    Hyperlipidemia    muliplt myelom 2020   Osteoarthritis of right hip 01/10/2013    SURGICAL HISTORY: Past Surgical History:  Procedure Laterality Date   CHOLECYSTECTOMY     EYE SURGERY  feb 2016   cataract right eye   LEG SURGERY Left    TONSILLECTOMY     TOTAL HIP ARTHROPLASTY Right 01/10/2013   Procedure: TOTAL HIP ARTHROPLASTY;  Surgeon: Fonda SHAUNNA Olmsted, MD;  Location: [facility] OR;  Service: Orthopedics;  Laterality: Right;    SOCIAL HISTORY: Social History   Socioeconomic History   Marital status: Married    Spouse name: Not on file   Number of children: 0   Years of education: Not on file   Highest education level: Some college, no degree  Occupational History   Occupation: retired  Tobacco Use   Smoking status: Former    Current packs/day: 0.00    Types: Cigarettes    Quit date: 01/10/1972    Years since quitting: 52.4   Smokeless tobacco: Never   Tobacco comments:    quit 1973  Vaping Use   Vaping status: Never Used  Substance and Sexual Activity   Alcohol use: No   Drug use: No   Sexual activity: Not on file  Other Topics Concern   Not on file  Social History Narrative   Married, no children. Drinks 2 cups decaf coffee a day. Walks daily. Is active, plays golf, she and her husband go hiking frequently. 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   Social Drivers of Corporate Investment Banker Strain: Not on file  Food Insecurity: No Food Insecurity (01/17/2023)   Hunger Vital Sign    Worried About Running Out of Food in the Last Year: Never true    Ran Out of Food in the Last Year: Never true  Transportation Needs: No Transportation Needs (01/17/2023)   PRAPARE - Administrator, Civil Service (Medical): No    Lack of Transportation (Non-Medical): No  Physical Activity: Not on file  Stress: Not on file  Social Connections: Not on file  Intimate Partner Violence: Not At Risk  (01/17/2023)   Humiliation, Afraid, Rape, and Kick questionnaire    Fear of Current or Ex-Partner: No    Emotionally Abused: No    Physically Abused: No    Sexually Abused: No    FAMILY HISTORY: Family History  Problem Relation Age of Onset   Congestive Heart Failure Mother    Arthritis-Osteo Mother    Colon cancer Mother    Heart attack Father    Rheum arthritis Father    Colon cancer Maternal Aunt    Colon cancer Maternal Aunt    Hypothyroidism Sister     ALLERGIES:  is allergic to kyprolis  [carfilzomib ], pomalyst  [pomalidomide ], revlimid  [lenalidomide ], betadine [povidone iodine], flagyl [metronidazole], fosamax [alendronate sodium], bactrim [sulfamethoxazole-trimethoprim], clindamycin/lincomycin, doxepin hcl, hydrocodone -acetaminophen , hydroxyzine, hydroxyzine hcl, tramadol hcl, valium [diazepam], actonel [risedronate sodium], boniva [ibandronate], doxycycline, and penicillins.  MEDICATIONS:  Current Outpatient Medications  Medication Sig Dispense Refill   acetaminophen  (TYLENOL ) 500 MG tablet Take  1,000 mg by mouth See admin instructions. Take 1,000 mg by mouth after breakfast, at 3:30 PM, and at 8:30 PM     acyclovir  (ZOVIRAX ) 400 MG tablet Take 1 tablet (400 mg total) by mouth 2 (two) times daily. 60 tablet 3   albuterol  (VENTOLIN  HFA) 108 (90 Base) MCG/ACT inhaler SMARTSIG:1 Puff(s) By Mouth Every 4-6 Hours PRN     augmented betamethasone dipropionate (DIPROLENE-AF) 0.05 % cream SMARTSIG:sparingly Topical Twice Daily PRN     bismuth subsalicylate (PEPTO BISMOL) 262 MG/15ML suspension Take 30 mLs by mouth every 6 (six) hours as needed.     famotidine  (PEPCID ) 20 MG tablet Take 1 tablet (20 mg total) by mouth daily. 30 tablet 0   famotidine -calcium carbonate-magnesium hydroxide (PEPCID  COMPLETE) 10-800-165 MG chewable tablet Chew 1 tablet by mouth daily as needed (for heartburn or indigestion).      guaiFENesin  (MUCINEX ) 600 MG 12 hr tablet Take 1 tablet (600 mg total) by mouth 2  (two) times daily. 40 tablet 0   hydrocortisone 2.5 % cream Apply topically 2 (two) times daily.     loperamide  (IMODIUM ) 2 MG capsule Take 1 capsule (2 mg total) by mouth 3 (three) times daily between meals as needed for diarrhea or loose stools. 30 capsule 1   rosuvastatin (CRESTOR) 5 MG tablet Take 5 mg by mouth daily.     triamcinolone  cream (KENALOG ) 0.1 % Apply 1 Application topically 2 (two) times daily. (Patient taking differently: Apply 1 Application topically 2 (two) times daily as needed (for allergic flares- affected areas).) 453.6 g 2   VASELINE PURE ULTRA WHITE ointment Apply 1 application  topically See admin instructions. Apply to the genitalia in the morning and at bedtime     No current facility-administered medications for this visit.    REVIEW OF SYSTEMS:   Constitutional: ( - ) fevers, ( - )  chills , ( - ) night sweats Eyes: ( - ) blurriness of vision, ( - ) double vision, ( - ) watery eyes Ears, nose, mouth, throat, and face: ( - ) mucositis, ( - ) sore throat Respiratory: ( - ) cough, ( - ) dyspnea, ( - ) wheezes Cardiovascular: ( - ) palpitation, ( - ) chest discomfort, ( - ) lower extremity swelling Gastrointestinal:  ( - ) nausea, ( - ) heartburn, ( - ) change in bowel habits Skin: ( +) abnormal skin rashes Lymphatics: ( - ) new lymphadenopathy, ( - ) easy bruising Neurological: ( - ) numbness, ( - ) tingling, ( - ) new weaknesses Behavioral/Psych: ( - ) mood change, ( - ) new changes  All other systems were reviewed with the patient and are negative.  PHYSICAL EXAMINATION: ECOG PERFORMANCE STATUS: 1 - Symptomatic but completely ambulatory  There were no vitals filed for this visit.   There were no vitals filed for this visit.   GENERAL: well appearing female in NAD  SKIN: skin color, texture, turgor are normal. Diffuse, flat erythematous rash.  EYES: conjunctiva are pink and non-injected, sclera clear LUNGS: clear to auscultation and percussion with  normal breathing effort HEART: regular rate & rhythm and no murmurs and no lower extremity edema Musculoskeletal: no cyanosis of digits and no clubbing  PSYCH: alert & oriented x 3, fluent speech NEURO: no focal motor/sensory deficits  LABORATORY DATA:  I have reviewed the data as listed    Latest Ref Rng & Units 06/08/2024    1:12 PM 06/01/2024   10:32 AM 05/18/2024  10:37 AM  CBC  WBC 4.0 - 10.5 K/uL 9.2  9.0  11.1   Hemoglobin 12.0 - 15.0 g/dL 87.8  87.1  87.7   Hematocrit 36.0 - 46.0 % 37.5  38.4  36.5   Platelets 150 - 400 K/uL 191  451  248        Latest Ref Rng & Units 06/08/2024    1:12 PM 06/01/2024   10:32 AM 05/18/2024   10:37 AM  CMP  Glucose 70 - 99 mg/dL 86  891  82   BUN 8 - 23 mg/dL 10  8  10    Creatinine 0.44 - 1.00 mg/dL 9.36  9.37  9.42   Sodium 135 - 145 mmol/L 136  137  137   Potassium 3.5 - 5.1 mmol/L 4.0  4.2  4.1   Chloride 98 - 111 mmol/L 102  104  104   CO2 22 - 32 mmol/L 29  29  30    Calcium 8.9 - 10.3 mg/dL 9.1  9.6  9.2   Total Protein 6.5 - 8.1 g/dL 6.6  6.4  6.4   Total Bilirubin 0.0 - 1.2 mg/dL 0.6  0.5  0.6   Alkaline Phos 38 - 126 U/L 66  63  51   AST 15 - 41 U/L 24  31  24    ALT 0 - 44 U/L 25  27  21      ASSESSMENT & PLAN Nicole Keith is a 83 y.o.female who presents to the clinic for follow up for multiple myeloma.  #IgG Lambda Multiple Myeloma: --Initially presented in April 2016 with M spike of 3.2 g/dL and IgG level of 5314. Underwent bone marrow biopsy from 11/20/2014 show 29% plasma cells. 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  --In December 2019 with rising M spike of 5.9 g/dL and worsening anemia, with Hgb 8.7, she met criteria for transformation of active multiple myeloma.  --From 07/27/2018-09/2018, received weekly velcade  plus dexamethasone . Therapy changed due to poor response.  --In 09/2018, started Revlimid  25 mg for 21 days on, 7 days off of a 28 day cycle with dexamethasone . Therapy was held after  2 cycles because of severe dermatological toxicity --In 12/2018, switched to Pomalyst  1 mg daily for 21 days.  She completed 4 cycles of therapy and therapy was held due to occurrence of dermatological toxicity.  --In 07/2019, switch to Ninlaro  4 mg weekly with dexamethasone  20 mg weekly for 3 weeks on and 1 week off. Daratumumab  was addd in April 2021. Ninlaro  was discontinued in November 2021. --On 05/18/2020, started monthly daratumumab  and transitioned to q 3 month schedule in April 2022.  --On 11/20/2022, switch to Kyprolis /Dex due to rising M-protein and bone survey that showed worsening lytic lesions. --Switched to biweekly infusion of Kyprolis /Dex per patient request  as she wants to prioritize her quality of life PLAN:   --Due for cycle 17, Day 15 today. --Labs today show white blood cell count 9.6, hemoglobin 12.4, MCV 99, platelets 436.  Creatinine and LFTs in range.  --Most recent myeloma labs from 04/06/2024 showed M protein 0.9 --Discussed the rising M protein with the patient today. Reviewed options including trying to revert back to weekly Kyprolis  schedule versus best supportive care. Patient agreed to try weekly Kyprolis  starting Cycle 18.  --Proceed with treatment today without any dose modifications.  --RTC in 2 weeks for continuation of Kyprolis /Dex   #H/O near syncopal episodes:  --Suspect hypoglycemia as patient reports not eating the day of the episode.  --  Encouraged patient to eat small, frequent meals   #Diffuse erythematous rash-stable --Managed by dermatology.  Patient prescribed 3 different steroid creams with instructions on how to use them which areas to apply. -- nystatin  powder for groin area.   #Supportive Care -- port not required but can be placed if requested.  -- zofran  8mg  q8H PRN and compazine  10mg  PO q6H for nausea -- acyclovir  400mg  PO BID for VCZ prophylaxis -- no pain medication required at this time.  -- discussed role for zometa  therapy in the  setting her lytic lesions. Explained zometa  is given q 12 weeks and requires a dental clearance.  After discussing this with the patient further today she knows she would like to hold on this for the time being as she is afraid of the side effects.  Strongly emphasized that she should reconsider as this will be helpful with her lytic bone lesions.  No orders of the defined types were placed in this encounter.  All questions were answered. The patient knows to call the clinic with any problems, questions or concerns.  I have spent a total of 30 minutes minutes of face-to-face and non-face-to-face time, preparing to see the patient,  performing a medically appropriate examination, counseling and educating the patient, ordering medications/tests/procedures,documenting clinical information in the electronic health record, and care coordination.   Norleen IVAR Kidney, MD Department of Hematology/Oncology [facility] Phone: [phone] Pager: [phone] Email: [email or web address]

## 2024-06-15 ENCOUNTER — Other Ambulatory Visit: Payer: Self-pay | Admitting: *Deleted

## 2024-06-15 ENCOUNTER — Ambulatory Visit

## 2024-06-15 ENCOUNTER — Inpatient Hospital Stay: Attending: Physician Assistant

## 2024-06-15 ENCOUNTER — Other Ambulatory Visit

## 2024-06-15 ENCOUNTER — Inpatient Hospital Stay

## 2024-06-15 ENCOUNTER — Other Ambulatory Visit (HOSPITAL_COMMUNITY): Payer: Self-pay

## 2024-06-15 ENCOUNTER — Inpatient Hospital Stay (HOSPITAL_BASED_OUTPATIENT_CLINIC_OR_DEPARTMENT_OTHER): Admitting: Hematology and Oncology

## 2024-06-15 ENCOUNTER — Ambulatory Visit: Admitting: Hematology

## 2024-06-15 VITALS — BP 143/67 | HR 63 | Temp 98.2°F | Resp 13 | Wt 124.7 lb

## 2024-06-15 DIAGNOSIS — Z79899 Other long term (current) drug therapy: Secondary | ICD-10-CM | POA: Diagnosis not present

## 2024-06-15 DIAGNOSIS — C9000 Multiple myeloma not having achieved remission: Secondary | ICD-10-CM | POA: Insufficient documentation

## 2024-06-15 DIAGNOSIS — Z5112 Encounter for antineoplastic immunotherapy: Secondary | ICD-10-CM | POA: Diagnosis present

## 2024-06-15 LAB — CMP (CANCER CENTER ONLY)
ALT: 25 U/L (ref 0–44)
AST: 24 U/L (ref 15–41)
Albumin: 3.7 g/dL (ref 3.5–5.0)
Alkaline Phosphatase: 54 U/L (ref 38–126)
Anion gap: 5 (ref 5–15)
BUN: 9 mg/dL (ref 8–23)
CO2: 27 mmol/L (ref 22–32)
Calcium: 9 mg/dL (ref 8.9–10.3)
Chloride: 104 mmol/L (ref 98–111)
Creatinine: 0.63 mg/dL (ref 0.44–1.00)
GFR, Estimated: >60 mL/min (ref >60)
Glucose, Bld: 143 mg/dL — ABNORMAL HIGH (ref 70–99)
Potassium: 4.1 mmol/L (ref 3.5–5.1)
Sodium: 136 mmol/L (ref 135–145)
Total Bilirubin: 0.7 mg/dL (ref 0.0–1.2)
Total Protein: 6.1 g/dL — ABNORMAL LOW (ref 6.5–8.1)

## 2024-06-15 LAB — CBC WITH DIFFERENTIAL (CANCER CENTER ONLY)
Abs Immature Granulocytes: 0.15 K/uL — ABNORMAL HIGH (ref 0.00–0.07)
Basophils Absolute: 0.1 K/uL (ref 0.0–0.1)
Basophils Relative: 0 %
Eosinophils Absolute: 0.6 K/uL — ABNORMAL HIGH (ref 0.0–0.5)
Eosinophils Relative: 5 %
HCT: 37.7 % (ref 36.0–46.0)
Hemoglobin: 12.6 g/dL (ref 12.0–15.0)
Immature Granulocytes: 1 %
Lymphocytes Relative: 10 %
Lymphs Abs: 1.2 K/uL (ref 0.7–4.0)
MCH: 33 pg (ref 26.0–34.0)
MCHC: 33.4 g/dL (ref 30.0–36.0)
MCV: 98.7 fL (ref 80.0–100.0)
Monocytes Absolute: 1.2 K/uL — ABNORMAL HIGH (ref 0.1–1.0)
Monocytes Relative: 10 %
Neutro Abs: 9.2 K/uL — ABNORMAL HIGH (ref 1.7–7.7)
Neutrophils Relative %: 74 %
Platelet Count: 242 K/uL (ref 150–400)
RBC: 3.82 MIL/uL — ABNORMAL LOW (ref 3.87–5.11)
RDW: 13 % (ref 11.5–15.5)
WBC Count: 12.4 K/uL — ABNORMAL HIGH (ref 4.0–10.5)
nRBC: 0 % (ref 0.0–0.2)

## 2024-06-15 MED ORDER — SODIUM CHLORIDE 0.9 % IV SOLN: Freq: Once | INTRAVENOUS | Status: AC

## 2024-06-15 MED ORDER — ACYCLOVIR 400 MG PO TABS
400.0000 mg | ORAL_TABLET | Freq: Two times a day (BID) | ORAL | 3 refills | Status: AC
  Filled 2024-06-15: qty 60, 30d supply, fill #0

## 2024-06-15 MED ORDER — SODIUM CHLORIDE 0.9 % IV SOLN
40.0000 mg | Freq: Once | INTRAVENOUS | Status: AC
  Administered 2024-06-15: 40 mg via INTRAVENOUS
  Filled 2024-06-15: qty 4

## 2024-06-15 MED ORDER — DEXTROSE 5 % IV SOLN
70.0000 mg/m2 | Freq: Once | INTRAVENOUS | Status: AC
  Administered 2024-06-15: 120 mg via INTRAVENOUS
  Filled 2024-06-15: qty 60

## 2024-06-15 NOTE — Patient Instructions (Signed)

## 2024-06-28 MED FILL — Dexamethasone Sodium Phosphate Inj 100 MG/10ML: INTRAMUSCULAR | Qty: 4 | Status: AC

## 2024-06-29 ENCOUNTER — Inpatient Hospital Stay

## 2024-06-29 ENCOUNTER — Inpatient Hospital Stay (HOSPITAL_BASED_OUTPATIENT_CLINIC_OR_DEPARTMENT_OTHER): Admitting: Physician Assistant

## 2024-06-29 VITALS — Wt 124.5 lb

## 2024-06-29 VITALS — BP 128/66 | HR 61 | Temp 97.2°F | Resp 16 | Ht 66.0 in

## 2024-06-29 DIAGNOSIS — C9000 Multiple myeloma not having achieved remission: Secondary | ICD-10-CM

## 2024-06-29 DIAGNOSIS — Z5112 Encounter for antineoplastic immunotherapy: Secondary | ICD-10-CM | POA: Diagnosis not present

## 2024-06-29 LAB — CBC WITH DIFFERENTIAL (CANCER CENTER ONLY)
Abs Immature Granulocytes: 0.05 K/uL (ref 0.00–0.07)
Basophils Absolute: 0.1 K/uL (ref 0.0–0.1)
Basophils Relative: 1 %
Eosinophils Absolute: 0.3 K/uL (ref 0.0–0.5)
Eosinophils Relative: 3 %
HCT: 38 % (ref 36.0–46.0)
Hemoglobin: 12.5 g/dL (ref 12.0–15.0)
Immature Granulocytes: 1 %
Lymphocytes Relative: 12 %
Lymphs Abs: 1.1 K/uL (ref 0.7–4.0)
MCH: 32.7 pg (ref 26.0–34.0)
MCHC: 32.9 g/dL (ref 30.0–36.0)
MCV: 99.5 fL (ref 80.0–100.0)
Monocytes Absolute: 0.9 K/uL (ref 0.1–1.0)
Monocytes Relative: 10 %
Neutro Abs: 6.9 K/uL (ref 1.7–7.7)
Neutrophils Relative %: 73 %
Platelet Count: 467 K/uL — ABNORMAL HIGH (ref 150–400)
RBC: 3.82 MIL/uL — ABNORMAL LOW (ref 3.87–5.11)
RDW: 12.9 % (ref 11.5–15.5)
WBC Count: 9.4 K/uL (ref 4.0–10.5)
nRBC: 0 % (ref 0.0–0.2)

## 2024-06-29 LAB — CMP (CANCER CENTER ONLY)
ALT: 25 U/L (ref 0–44)
AST: 28 U/L (ref 15–41)
Albumin: 3.9 g/dL (ref 3.5–5.0)
Alkaline Phosphatase: 63 U/L (ref 38–126)
Anion gap: 9 (ref 5–15)
BUN: 9 mg/dL (ref 8–23)
CO2: 25 mmol/L (ref 22–32)
Calcium: 9.2 mg/dL (ref 8.9–10.3)
Chloride: 103 mmol/L (ref 98–111)
Creatinine: 0.6 mg/dL (ref 0.44–1.00)
GFR, Estimated: >60 mL/min (ref >60)
Glucose, Bld: 119 mg/dL — ABNORMAL HIGH (ref 70–99)
Potassium: 4.2 mmol/L (ref 3.5–5.1)
Sodium: 137 mmol/L (ref 135–145)
Total Bilirubin: 0.6 mg/dL (ref 0.0–1.2)
Total Protein: 6.1 g/dL — ABNORMAL LOW (ref 6.5–8.1)

## 2024-06-29 MED ORDER — SODIUM CHLORIDE 0.9 % IV SOLN: Freq: Once | INTRAVENOUS | Status: AC

## 2024-06-29 MED ORDER — SODIUM CHLORIDE 0.9 % IV SOLN
40.0000 mg | Freq: Once | INTRAVENOUS | Status: AC
  Administered 2024-06-29: 40 mg via INTRAVENOUS
  Filled 2024-06-29: qty 4

## 2024-06-29 MED ORDER — DEXTROSE 5 % IV SOLN
70.0000 mg/m2 | Freq: Once | INTRAVENOUS | Status: AC
  Administered 2024-06-29: 120 mg via INTRAVENOUS
  Filled 2024-06-29: qty 60

## 2024-06-29 NOTE — Patient Instructions (Signed)
 [facility] - A DEPT OF [facility]   Discharge Instructions: Thank you for choosing [facility] to provide your oncology and hematology care.   If you have a lab appointment with the Cancer Center, please go directly to the Cancer Center and check in at the registration area.   Wear comfortable clothing and clothing appropriate for easy access to any Portacath or PICC line.   We strive to give you quality time with your provider. You may need to reschedule your appointment if you arrive late (15 or more minutes).  Arriving late affects you and other patients whose appointments are after yours.  Also, if you miss three or more appointments without notifying the office, you may be dismissed from the clinic at the provider's discretion.      For prescription refill requests, have your pharmacy contact our office and allow 72 hours for refills to be completed.    Today you received the following chemotherapy and/or immunotherapy agents: Carfilzomib (Kyprolis)      To help prevent nausea and vomiting after your treatment, we encourage you to take your nausea medication as directed.  BELOW ARE SYMPTOMS THAT SHOULD BE REPORTED IMMEDIATELY: *FEVER GREATER THAN 100.4 F (38 C) OR HIGHER *CHILLS OR SWEATING *NAUSEA AND VOMITING THAT IS NOT CONTROLLED WITH YOUR NAUSEA MEDICATION *UNUSUAL SHORTNESS OF BREATH *UNUSUAL BRUISING OR BLEEDING *URINARY PROBLEMS (pain or burning when urinating, or frequent urination) *BOWEL PROBLEMS (unusual diarrhea, constipation, pain near the anus) TENDERNESS IN MOUTH AND THROAT WITH OR WITHOUT PRESENCE OF ULCERS (sore throat, sores in mouth, or a toothache) UNUSUAL RASH, SWELLING OR PAIN  UNUSUAL VAGINAL DISCHARGE OR ITCHING   Items with * indicate a potential emergency and should be followed up as soon as possible or go to the Emergency Department if any problems should occur.  Please show the CHEMOTHERAPY ALERT CARD or  IMMUNOTHERAPY ALERT CARD at check-in to the Emergency Department and triage nurse.  Should you have questions after your visit or need to cancel or reschedule your appointment, please contact [facility] - A DEPT [facility]  Dept: [phone]  and follow the prompts.  Office hours are 8:00 a.m. to 4:30 p.m. Monday - Friday. Please note that voicemails left after 4:00 p.m. may not be returned until the following business day.  We are closed weekends and major holidays. You have access to a nurse at all times for urgent questions. Please call the main number to the clinic Dept: [phone] and follow the prompts.   For any non-urgent questions, you may also contact your provider using MyChart. We now offer e-Visits for anyone 55 and older to request care online for non-urgent symptoms. For details visit [email or web address].   Also download the MyChart app! Go to the app store, search "MyChart", open the app, select [place], and log in with your MyChart username and password.

## 2024-06-29 NOTE — Progress Notes (Signed)
 [facility] [phone]   [phone]  PROGRESS NOTE  Patient Care Team: Kip Righter, MD as PCP - General (Family Medicine)  CHIEF COMPLAINTS/PURPOSE OF CONSULTATION:  IgG Lambda Multiple Myeloma  ONCOLOGIC HISTORY: 07/27/2018-09/2018: Received weekly velcade  plus dexamethasone . Therapy changed due to poor response.  09/2018: Started Revlimid  25 mg for 21 days on, 7 days off of a 28 day cycle with dexamethasone . Therapy was held after 2 cycles because of severe dermatological toxicity 12/2018: Pomalyst  1 mg daily for 21 days.  She completed 4 cycles of therapy and therapy was held due to occurrence of dermatological toxicity.  07/2019: Started Ninlaro  4 mg weekly with dexamethasone  20 mg weekly for 3 weeks on and 1 week off. Daratumumab  was addd in April 2021. Ninlaro  was discontinued in November 2021. 05/18/2020: Started monthly daratumumab  and transitioned to q 3 month schedule in April 2022.  11/06/2022: Darzalex  discontinued due to progression of disease. M protein 0.9 with worsening lytic lesions in the skeleton.  11/20/2022: Cycle 1 Day 1 of Kyprolis /Dex therapy.  12/19/2022: Cycle 2 Day 1 of Kyprolis /Dex therapy 01/15/2023: Cycle 3 Day 1 of Kyprolis /Dex therapy 02/19/2023: Cycle 4 Day 1 of Kyprolis /Dex therapy 03/18/2023: Cycle 5 Day 1 of Kyprolis /Dex therapy 04/25/2023: Cycle 6 Day 1 of Kyprolis /Dex therapy 05/28/2023: Cycle 7 Day 1 of Kyprolis /Dex therapy 06/11/2023: Cycle 8 Day 1 of Kyprolis /Dex therapy 07/08/2023 Cycle 9 Day 1 of Kyprolis /Dex therapy 09/11/2023 Cycle 10 Day 1 of Kyprolis /Dex therapy 10/09/2023: Cycle 11 Day 1 of Kyprolis /Dex therapy 11/05/2023: Cycle 12 Day 1 of Kyprolis /Dex therapy 12/02/2023: Cycle 13 Day 1 of Kyprolis /Dex therapy 01/17/2024: Cycle 14 Day 1 of Kyprolis /Dex therapy 02/10/2024: Cycle 15 Day 1 of Kyprolis /Dex therapy 03/10/2024: Cycle 16 Day 1 of Kyprolis /Dex therapy 04/06/2024: Cycle 17 Day 1 of Kyprolis /Dex  therapy 05/04/2024: Cycle 18 Day 1 of Kyprolis /Dex therapy 06/01/2024: Cycle 19 Day 1 of Kyprolis /Dex therapy 06/29/2024: Cycle 20 Day 1 of Kyprolis /Dex therapy  HISTORY OF PRESENTING ILLNESS:  Nicole Keith 83 y.o. female returns for a follow up for IgG lambda multiple myeloma. She was last seen on 06/15/2024. She presents today for Cycle 20, Day 1 of Kyprolis /Dex.   On exam today, Ms. Edling reports she is been well overall in the interim since our last visit.  She continues to walk daily with her husband. She noticed more fatigue this past cycle especially towards the end of the day. She reports appetite is unchanged. She denies nausea, vomiting or bowel habit changes. She denies easy bruising or overt signs of bleeding.  She denies any fevers, chills, sweats, chest pain, shortness of breath, headaches, dizziness or new back/bone pain.  A full 10 point ROS otherwise negative.   MEDICAL HISTORY:  Past Medical History:  Diagnosis Date   Allergic rhinitis    Arthritis    Colitis, collagenous    Diverticular disease    severe sig tics/fixation 2012   Family hx of colon cancer    Fracture of femoral neck, right (HCC) 01/09/2013   GERD (gastroesophageal reflux disease)    exacerbation of reflux-like symptoms 04/2011   Hiatal hernia    Hyperlipidemia    muliplt myelom 2020   Osteoarthritis of right hip 01/10/2013    SURGICAL HISTORY: Past Surgical History:  Procedure Laterality Date   CHOLECYSTECTOMY     EYE SURGERY  feb 2016   cataract right eye   LEG SURGERY Left    TONSILLECTOMY     TOTAL HIP ARTHROPLASTY Right 01/10/2013  Procedure: TOTAL HIP ARTHROPLASTY;  Surgeon: Fonda SHAUNNA Olmsted, MD;  Location: [facility] OR;  Service: Orthopedics;  Laterality: Right;    SOCIAL HISTORY: Social History   Socioeconomic History   Marital status: Married    Spouse name: Not on file   Number of children: 0   Years of education: Not on file   Highest education level: Some college, no degree   Occupational History   Occupation: retired  Tobacco Use   Smoking status: Former    Current packs/day: 0.00    Types: Cigarettes    Quit date: 01/10/1972    Years since quitting: 52.5   Smokeless tobacco: Never   Tobacco comments:    quit 1973  Vaping Use   Vaping status: Never Used  Substance and Sexual Activity   Alcohol use: No   Drug use: No   Sexual activity: Not on file  Other Topics Concern   Not on file  Social History Narrative   Married, no children. Drinks 2 cups decaf coffee a day. Walks daily. Is active, plays golf, she and her husband go hiking frequently. 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   Social Drivers of Corporate Investment Banker Strain: Not on file  Food Insecurity: No Food Insecurity (01/17/2023)   Hunger Vital Sign    Worried About Running Out of Food in the Last Year: Never true    Ran Out of Food in the Last Year: Never true  Transportation Needs: No Transportation Needs (01/17/2023)   PRAPARE - Administrator, Civil Service (Medical): No    Lack of Transportation (Non-Medical): No  Physical Activity: Not on file  Stress: Not on file  Social Connections: Not on file  Intimate Partner Violence: Not At Risk (01/17/2023)   Humiliation, Afraid, Rape, and Kick questionnaire    Fear of Current or Ex-Partner: No    Emotionally Abused: No    Physically Abused: No    Sexually Abused: No    FAMILY HISTORY: Family History  Problem Relation Age of Onset   Congestive Heart Failure Mother    Arthritis-Osteo Mother    Colon cancer Mother    Heart attack Father    Rheum arthritis Father    Colon cancer Maternal Aunt    Colon cancer Maternal Aunt    Hypothyroidism Sister     ALLERGIES:  is allergic to kyprolis  [carfilzomib ], pomalyst  [pomalidomide ], revlimid  [lenalidomide ], betadine [povidone iodine], flagyl [metronidazole], fosamax [alendronate sodium], bactrim [sulfamethoxazole-trimethoprim], clindamycin/lincomycin,  doxepin hcl, hydrocodone -acetaminophen , hydroxyzine, hydroxyzine hcl, tramadol hcl, valium [diazepam], actonel [risedronate sodium], boniva [ibandronate], doxycycline, and penicillins.  MEDICATIONS:  Current Outpatient Medications  Medication Sig Dispense Refill   acetaminophen  (TYLENOL ) 500 MG tablet Take 1,000 mg by mouth See admin instructions. Take 1,000 mg by mouth after breakfast, at 3:30 PM, and at 8:30 PM     acyclovir  (ZOVIRAX ) 400 MG tablet Take 1 tablet (400 mg total) by mouth 2 (two) times daily. 60 tablet 3   albuterol  (VENTOLIN  HFA) 108 (90 Base) MCG/ACT inhaler SMARTSIG:1 Puff(s) By Mouth Every 4-6 Hours PRN     augmented betamethasone dipropionate (DIPROLENE-AF) 0.05 % cream SMARTSIG:sparingly Topical Twice Daily PRN     bismuth subsalicylate (PEPTO BISMOL) 262 MG/15ML suspension Take 30 mLs by mouth every 6 (six) hours as needed.     famotidine  (PEPCID ) 20 MG tablet Take 1 tablet (20 mg total) by mouth daily. 30 tablet 0   famotidine -calcium carbonate-magnesium hydroxide (PEPCID  COMPLETE) 10-800-165 MG chewable tablet Chew 1  tablet by mouth daily as needed (for heartburn or indigestion).      guaiFENesin  (MUCINEX ) 600 MG 12 hr tablet Take 1 tablet (600 mg total) by mouth 2 (two) times daily. 40 tablet 0   hydrocortisone 2.5 % cream Apply topically 2 (two) times daily.     loperamide  (IMODIUM ) 2 MG capsule Take 1 capsule (2 mg total) by mouth 3 (three) times daily between meals as needed for diarrhea or loose stools. 30 capsule 1   rosuvastatin (CRESTOR) 5 MG tablet Take 5 mg by mouth daily.     triamcinolone  cream (KENALOG ) 0.1 % Apply 1 Application topically 2 (two) times daily. (Patient taking differently: Apply 1 Application topically 2 (two) times daily as needed (for allergic flares- affected areas).) 453.6 g 2   VASELINE PURE ULTRA WHITE ointment Apply 1 application  topically See admin instructions. Apply to the genitalia in the morning and at bedtime     No current  facility-administered medications for this visit.   Facility-Administered Medications Ordered in Other Visits  Medication Dose Route Frequency Provider Last Rate Last Admin   carfilzomib  (KYPROLIS ) 120 mg in dextrose  5 % 100 mL chemo infusion  70 mg/m2 (Treatment Plan Recorded) Intravenous Once Federico Norleen ONEIDA MADISON, MD        REVIEW OF SYSTEMS:   Constitutional: ( - ) fevers, ( - )  chills , ( - ) night sweats Eyes: ( - ) blurriness of vision, ( - ) double vision, ( - ) watery eyes Ears, nose, mouth, throat, and face: ( - ) mucositis, ( - ) sore throat Respiratory: ( - ) cough, ( - ) dyspnea, ( - ) wheezes Cardiovascular: ( - ) palpitation, ( - ) chest discomfort, ( - ) lower extremity swelling Gastrointestinal:  ( - ) nausea, ( - ) heartburn, ( - ) change in bowel habits Skin: ( +) abnormal skin rashes Lymphatics: ( - ) new lymphadenopathy, ( - ) easy bruising Neurological: ( - ) numbness, ( - ) tingling, ( - ) new weaknesses Behavioral/Psych: ( - ) mood change, ( - ) new changes  All other systems were reviewed with the patient and are negative.  PHYSICAL EXAMINATION: ECOG PERFORMANCE STATUS: 1 - Symptomatic but completely ambulatory  Vitals:   06/29/24 1129  BP: 128/66  Pulse: 61  Resp: 16  Temp: (!) 97.2 F (36.2 C)  SpO2: 97%     There were no vitals filed for this visit.   GENERAL: well appearing female in NAD  SKIN: skin color, texture, turgor are normal. Diffuse, flat erythematous rash involving bilateral forearms.  EYES: conjunctiva are pink and non-injected, sclera clear LUNGS: clear to auscultation and percussion with normal breathing effort HEART: regular rate & rhythm and no murmurs and no lower extremity edema Musculoskeletal: no cyanosis of digits and no clubbing  PSYCH: alert & oriented x 3, fluent speech NEURO: no focal motor/sensory deficits  LABORATORY DATA:  I have reviewed the data as listed    Latest Ref Rng & Units 06/29/2024   10:08 AM 06/15/2024     9:56 AM 06/08/2024    1:12 PM  CBC  WBC 4.0 - 10.5 K/uL 9.4  12.4  9.2   Hemoglobin 12.0 - 15.0 g/dL 87.4  87.3  87.8   Hematocrit 36.0 - 46.0 % 38.0  37.7  37.5   Platelets 150 - 400 K/uL 467  242  191        Latest Ref Rng & Units 06/29/2024  10:08 AM 06/15/2024    9:56 AM 06/08/2024    1:12 PM  CMP  Glucose 70 - 99 mg/dL 880  856  86   BUN 8 - 23 mg/dL 9  9  10    Creatinine 0.44 - 1.00 mg/dL 9.39  9.36  9.36   Sodium 135 - 145 mmol/L 137  136  136   Potassium 3.5 - 5.1 mmol/L 4.2  4.1  4.0   Chloride 98 - 111 mmol/L 103  104  102   CO2 22 - 32 mmol/L 25  27  29    Calcium 8.9 - 10.3 mg/dL 9.2  9.0  9.1   Total Protein 6.5 - 8.1 g/dL 6.1  6.1  6.6   Total Bilirubin 0.0 - 1.2 mg/dL 0.6  0.7  0.6   Alkaline Phos 38 - 126 U/L 63  54  66   AST 15 - 41 U/L 28  24  24    ALT 0 - 44 U/L 25  25  25      ASSESSMENT & PLAN Nicole Keith is a 83 y.o.female who presents to the clinic for follow up for multiple myeloma.  #IgG Lambda Multiple Myeloma: --Initially presented in April 2016 with M spike of 3.2 g/dL and IgG level of 5314. Underwent bone marrow biopsy from 11/20/2014 show 29% plasma cells. 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  --In December 2019 with rising M spike of 5.9 g/dL and worsening anemia, with Hgb 8.7, she met criteria for transformation of active multiple myeloma.  --From 07/27/2018-09/2018, received weekly velcade  plus dexamethasone . Therapy changed due to poor response.  --In 09/2018, started Revlimid  25 mg for 21 days on, 7 days off of a 28 day cycle with dexamethasone . Therapy was held after 2 cycles because of severe dermatological toxicity --In 12/2018, switched to Pomalyst  1 mg daily for 21 days.  She completed 4 cycles of therapy and therapy was held due to occurrence of dermatological toxicity.  --In 07/2019, switch to Ninlaro  4 mg weekly with dexamethasone  20 mg weekly for 3 weeks on and 1 week off. Daratumumab  was addd in April  2021. Ninlaro  was discontinued in November 2021. --On 05/18/2020, started monthly daratumumab  and transitioned to q 3 month schedule in April 2022.  --On 11/20/2022, switch to Kyprolis /Dex due to rising M-protein and bone survey that showed worsening lytic lesions. --Switched to biweekly infusion of Kyprolis /Dex per patient request  as she wants to prioritize her quality of life PLAN:   --Due for cycle 20, Day 1 today. --Labs today show white blood cell count 9.4, Hgb  12.5, Plt 467, creatinine and calcium levels are normal.  --Most recent myeloma labs from 06/08/2024 showed M protein 1.0  --Proceed with treatment today without any dose modifications.  --RTC in 2 weeks for continuation of Kyprolis /Dex   #H/O near syncopal episodes:  --Suspect hypoglycemia as patient reports not eating the day of the episode.  --Encouraged patient to eat small, frequent meals   #Diffuse erythematous rash-stable --Managed by dermatology.  Patient prescribed 3 different steroid creams with instructions on how to use them which areas to apply. -- nystatin  powder for groin area.   #Supportive Care -- port not required but can be placed if requested.  -- zofran  8mg  q8H PRN and compazine  10mg  PO q6H for nausea -- acyclovir  400mg  PO BID for VCZ prophylaxis -- no pain medication required at this time.  -- discussed role for zometa  therapy in the setting her lytic lesions. Explained zometa  is  given q 12 weeks and requires a dental clearance.  After discussing this with the patient further today she knows she would like to hold on this for the time being as she is afraid of the side effects.  Strongly emphasized that she should reconsider as this will be helpful with her lytic bone lesions.  No orders of the defined types were placed in this encounter.  All questions were answered. The patient knows to call the clinic with any problems, questions or concerns.  I have spent a total of 30 minutes minutes of  face-to-face and non-face-to-face time, preparing to see the patient,  performing a medically appropriate examination, counseling and educating the patient, ordering medications/tests/procedures,documenting clinical information in the electronic health record, and care coordination.   Johnston Police PA-C Dept of Hematology and Oncology [facility] at [facility] Phone: [phone]

## 2024-06-30 LAB — KAPPA/LAMBDA LIGHT CHAINS
Kappa free light chain: 1.2 mg/L — ABNORMAL LOW (ref 3.3–19.4)
Kappa, lambda light chain ratio: 0.03 — ABNORMAL LOW (ref 0.26–1.65)
Lambda free light chains: 40 mg/L — ABNORMAL HIGH (ref 5.7–26.3)

## 2024-07-01 LAB — MULTIPLE MYELOMA PANEL, SERUM
Albumin SerPl Elph-Mcnc: 3.3 g/dL (ref 2.9–4.4)
Albumin/Glob SerPl: 1.3 (ref 0.7–1.7)
Alpha 1: 0.3 g/dL (ref 0.0–0.4)
Alpha2 Glob SerPl Elph-Mcnc: 0.6 g/dL (ref 0.4–1.0)
B-Globulin SerPl Elph-Mcnc: 0.8 g/dL (ref 0.7–1.3)
Gamma Glob SerPl Elph-Mcnc: 1.1 g/dL (ref 0.4–1.8)
Globulin, Total: 2.7 g/dL (ref 2.2–3.9)
IgA: 5 mg/dL — ABNORMAL LOW (ref 64–422)
IgG (Immunoglobin G), Serum: 1065 mg/dL (ref 586–1602)
IgM (Immunoglobulin M), Srm: 8 mg/dL — ABNORMAL LOW (ref 26–217)
M Protein SerPl Elph-Mcnc: 0.9 g/dL — ABNORMAL HIGH
Total Protein ELP: 6 g/dL (ref 6.0–8.5)

## 2024-07-05 MED FILL — Dexamethasone Sodium Phosphate Inj 100 MG/10ML: INTRAMUSCULAR | Qty: 4 | Status: AC

## 2024-07-06 ENCOUNTER — Inpatient Hospital Stay

## 2024-07-06 VITALS — BP 131/75 | HR 60 | Temp 97.4°F | Resp 16

## 2024-07-06 DIAGNOSIS — C9000 Multiple myeloma not having achieved remission: Secondary | ICD-10-CM

## 2024-07-06 DIAGNOSIS — Z5112 Encounter for antineoplastic immunotherapy: Secondary | ICD-10-CM | POA: Diagnosis not present

## 2024-07-06 LAB — CBC WITH DIFFERENTIAL (CANCER CENTER ONLY)
Abs Immature Granulocytes: 0.11 K/uL — ABNORMAL HIGH (ref 0.00–0.07)
Basophils Absolute: 0.1 K/uL (ref 0.0–0.1)
Basophils Relative: 1 %
Eosinophils Absolute: 0.4 K/uL (ref 0.0–0.5)
Eosinophils Relative: 4 %
HCT: 36.8 % (ref 36.0–46.0)
Hemoglobin: 12.2 g/dL (ref 12.0–15.0)
Immature Granulocytes: 1 %
Lymphocytes Relative: 15 %
Lymphs Abs: 1.4 K/uL (ref 0.7–4.0)
MCH: 33.2 pg (ref 26.0–34.0)
MCHC: 33.2 g/dL (ref 30.0–36.0)
MCV: 100 fL (ref 80.0–100.0)
Monocytes Absolute: 1.2 K/uL — ABNORMAL HIGH (ref 0.1–1.0)
Monocytes Relative: 13 %
Neutro Abs: 6.1 K/uL (ref 1.7–7.7)
Neutrophils Relative %: 66 %
Platelet Count: 191 K/uL (ref 150–400)
RBC: 3.68 MIL/uL — ABNORMAL LOW (ref 3.87–5.11)
RDW: 13 % (ref 11.5–15.5)
WBC Count: 9.3 K/uL (ref 4.0–10.5)
nRBC: 0 % (ref 0.0–0.2)

## 2024-07-06 LAB — CMP (CANCER CENTER ONLY)
ALT: 23 U/L (ref 0–44)
AST: 26 U/L (ref 15–41)
Albumin: 3.9 g/dL (ref 3.5–5.0)
Alkaline Phosphatase: 57 U/L (ref 38–126)
Anion gap: 9 (ref 5–15)
BUN: 11 mg/dL (ref 8–23)
CO2: 26 mmol/L (ref 22–32)
Calcium: 9.3 mg/dL (ref 8.9–10.3)
Chloride: 102 mmol/L (ref 98–111)
Creatinine: 0.64 mg/dL (ref 0.44–1.00)
GFR, Estimated: >60 mL/min (ref >60)
Glucose, Bld: 108 mg/dL — ABNORMAL HIGH (ref 70–99)
Potassium: 4.1 mmol/L (ref 3.5–5.1)
Sodium: 137 mmol/L (ref 135–145)
Total Bilirubin: 0.7 mg/dL (ref 0.0–1.2)
Total Protein: 6.3 g/dL — ABNORMAL LOW (ref 6.5–8.1)

## 2024-07-06 MED ORDER — SODIUM CHLORIDE 0.9 % IV SOLN: Freq: Once | INTRAVENOUS | Status: AC

## 2024-07-06 MED ORDER — SODIUM CHLORIDE 0.9 % IV SOLN
40.0000 mg | Freq: Once | INTRAVENOUS | Status: AC
  Administered 2024-07-06: 40 mg via INTRAVENOUS
  Filled 2024-07-06: qty 4

## 2024-07-06 MED ORDER — DEXTROSE 5 % IV SOLN
70.0000 mg/m2 | Freq: Once | INTRAVENOUS | Status: AC
  Administered 2024-07-06: 120 mg via INTRAVENOUS
  Filled 2024-07-06: qty 60

## 2024-07-06 NOTE — Patient Instructions (Addendum)
 [facility] - A DEPT OF [place]  Discharge Instructions: Thank you for choosing [facility] to provide your oncology and hematology care.   If you have a lab appointment with the Cancer Center, please go directly to the Cancer Center and check in at the registration area.   Wear comfortable clothing and clothing appropriate for easy access to any Portacath or PICC line.   We strive to give you quality time with your provider. You may need to reschedule your appointment if you arrive late (15 or more minutes).  Arriving late affects you and other patients whose appointments are after yours.  Also, if you miss three or more appointments without notifying the office, you may be dismissed from the clinic at the provider's discretion.      For prescription refill requests, have your pharmacy contact our office and allow 72 hours for refills to be completed.    Today you received the following chemotherapy and/or immunotherapy agents: Carfilzomib  (Kyprolis )      To help prevent nausea and vomiting after your treatment, we encourage you to take your nausea medication as directed.  BELOW ARE SYMPTOMS THAT SHOULD BE REPORTED IMMEDIATELY: *FEVER GREATER THAN 100.4 F (38 C) OR HIGHER *CHILLS OR SWEATING *NAUSEA AND VOMITING THAT IS NOT CONTROLLED WITH YOUR NAUSEA MEDICATION *UNUSUAL SHORTNESS OF BREATH *UNUSUAL BRUISING OR BLEEDING *URINARY PROBLEMS (pain or burning when urinating, or frequent urination) *BOWEL PROBLEMS (unusual diarrhea, constipation, pain near the anus) TENDERNESS IN MOUTH AND THROAT WITH OR WITHOUT PRESENCE OF ULCERS (sore throat, sores in mouth, or a toothache) UNUSUAL RASH, SWELLING OR PAIN  UNUSUAL VAGINAL DISCHARGE OR ITCHING   Items with * indicate a potential emergency and should be followed up as soon as possible or go to the Emergency Department if any problems should occur.  Please show the CHEMOTHERAPY ALERT CARD or  IMMUNOTHERAPY ALERT CARD at check-in to the Emergency Department and triage nurse.  Should you have questions after your visit or need to cancel or reschedule your appointment, please contact [facility] - A DEPT OF [facility]  Dept: [phone]  and follow the prompts.  Office hours are 8:00 a.m. to 4:30 p.m. Monday - Friday. Please note that voicemails left after 4:00 p.m. may not be returned until the following business day.  We are closed weekends and major holidays. You have access to a nurse at all times for urgent questions. Please call the main number to the clinic Dept: [phone] and follow the prompts.   For any non-urgent questions, you may also contact your provider using MyChart. We now offer e-Visits for anyone 33 and older to request care online for non-urgent symptoms. For details visit [email or web address].   Also download the MyChart app! Go to the app store, search MyChart, open the app, select [place], and log in with your MyChart username and password.[facility] - A DEPT OF [place]  Discharge Instructions: Thank you for choosing [facility] to provide your oncology and hematology care.   If you have a lab appointment with the Cancer Center, please go directly to the Cancer Center and check in at the registration area.   Wear comfortable clothing and clothing appropriate for easy access to any Portacath or PICC line.   We strive to give you quality time with your provider. You may need to reschedule  your appointment if you arrive late (15 or more minutes).  Arriving late affects you and other patients whose appointments are after yours.  Also, if you miss three or more appointments without notifying the office, you may be dismissed from the clinic at the provider's discretion.      For prescription refill requests, have your pharmacy contact our office and allow 72 hours for refills  to be completed.    Today you received the following chemotherapy and/or immunotherapy agents: Carfilzomib  (Kyprolis )      To help prevent nausea and vomiting after your treatment, we encourage you to take your nausea medication as directed.  BELOW ARE SYMPTOMS THAT SHOULD BE REPORTED IMMEDIATELY: *FEVER GREATER THAN 100.4 F (38 C) OR HIGHER *CHILLS OR SWEATING *NAUSEA AND VOMITING THAT IS NOT CONTROLLED WITH YOUR NAUSEA MEDICATION *UNUSUAL SHORTNESS OF BREATH *UNUSUAL BRUISING OR BLEEDING *URINARY PROBLEMS (pain or burning when urinating, or frequent urination) *BOWEL PROBLEMS (unusual diarrhea, constipation, pain near the anus) TENDERNESS IN MOUTH AND THROAT WITH OR WITHOUT PRESENCE OF ULCERS (sore throat, sores in mouth, or a toothache) UNUSUAL RASH, SWELLING OR PAIN  UNUSUAL VAGINAL DISCHARGE OR ITCHING   Items with * indicate a potential emergency and should be followed up as soon as possible or go to the Emergency Department if any problems should occur.  Please show the CHEMOTHERAPY ALERT CARD or IMMUNOTHERAPY ALERT CARD at check-in to the Emergency Department and triage nurse.  Should you have questions after your visit or need to cancel or reschedule your appointment, please contact [facility]  Dept: [phone]  and follow the prompts.  Office hours are 8:00 a.m. to 4:30 p.m. Monday - Friday. Please note that voicemails left after 4:00 p.m. may not be returned until the following business day.  We are closed weekends and major holidays. You have access to a nurse at all times for urgent questions. Please call the main number to the clinic Dept: [phone] and follow the prompts.   For any non-urgent questions, you may also contact your provider using MyChart. We now offer e-Visits for anyone 50 and older to request care online for non-urgent symptoms. For details visit [email or web address].   Also download the  MyChart app! Go to the app store, search MyChart, open the app, select [place], and log in with your MyChart username and password.

## 2024-07-07 LAB — KAPPA/LAMBDA LIGHT CHAINS
Kappa free light chain: 1.2 mg/L — ABNORMAL LOW (ref 3.3–19.4)
Kappa, lambda light chain ratio: 0.03 — ABNORMAL LOW (ref 0.26–1.65)
Lambda free light chains: 36.9 mg/L — ABNORMAL HIGH (ref 5.7–26.3)

## 2024-07-10 LAB — MULTIPLE MYELOMA PANEL, SERUM
Albumin SerPl Elph-Mcnc: 3.3 g/dL (ref 2.9–4.4)
Albumin/Glob SerPl: 1.4 (ref 0.7–1.7)
Alpha 1: 0.2 g/dL (ref 0.0–0.4)
Alpha2 Glob SerPl Elph-Mcnc: 0.5 g/dL (ref 0.4–1.0)
B-Globulin SerPl Elph-Mcnc: 0.7 g/dL (ref 0.7–1.3)
Gamma Glob SerPl Elph-Mcnc: 0.9 g/dL (ref 0.4–1.8)
Globulin, Total: 2.4 g/dL (ref 2.2–3.9)
IgA: 9 mg/dL — ABNORMAL LOW (ref 64–422)
IgG (Immunoglobin G), Serum: 1150 mg/dL (ref 586–1602)
IgM (Immunoglobulin M), Srm: 6 mg/dL — ABNORMAL LOW (ref 26–217)
M Protein SerPl Elph-Mcnc: 0.8 g/dL — ABNORMAL HIGH
Total Protein ELP: 5.7 g/dL — ABNORMAL LOW (ref 6.0–8.5)

## 2024-07-12 MED FILL — Dexamethasone Sodium Phosphate Inj 100 MG/10ML: INTRAMUSCULAR | Qty: 4 | Status: AC

## 2024-07-13 ENCOUNTER — Inpatient Hospital Stay: Admitting: Hematology and Oncology

## 2024-07-13 ENCOUNTER — Inpatient Hospital Stay: Attending: Physician Assistant

## 2024-07-13 ENCOUNTER — Encounter: Payer: Self-pay | Admitting: Hematology and Oncology

## 2024-07-13 VITALS — BP 157/73 | HR 65 | Temp 98.8°F | Resp 16

## 2024-07-13 VITALS — BP 154/77 | HR 63 | Temp 97.4°F | Resp 13 | Wt 123.8 lb

## 2024-07-13 DIAGNOSIS — Z5112 Encounter for antineoplastic immunotherapy: Secondary | ICD-10-CM | POA: Diagnosis present

## 2024-07-13 DIAGNOSIS — Z79899 Other long term (current) drug therapy: Secondary | ICD-10-CM | POA: Insufficient documentation

## 2024-07-13 DIAGNOSIS — C9000 Multiple myeloma not having achieved remission: Secondary | ICD-10-CM | POA: Diagnosis not present

## 2024-07-13 LAB — CBC WITH DIFFERENTIAL (CANCER CENTER ONLY)
Abs Immature Granulocytes: 0.22 K/uL — ABNORMAL HIGH (ref 0.00–0.07)
Basophils Absolute: 0.1 K/uL (ref 0.0–0.1)
Basophils Relative: 0 %
Eosinophils Absolute: 0.3 K/uL (ref 0.0–0.5)
Eosinophils Relative: 2 %
HCT: 36.7 % (ref 36.0–46.0)
Hemoglobin: 12.3 g/dL (ref 12.0–15.0)
Immature Granulocytes: 2 %
Lymphocytes Relative: 11 %
Lymphs Abs: 1.4 K/uL (ref 0.7–4.0)
MCH: 33.4 pg (ref 26.0–34.0)
MCHC: 33.5 g/dL (ref 30.0–36.0)
MCV: 99.7 fL (ref 80.0–100.0)
Monocytes Absolute: 1.2 K/uL — ABNORMAL HIGH (ref 0.1–1.0)
Monocytes Relative: 9 %
Neutro Abs: 9.8 K/uL — ABNORMAL HIGH (ref 1.7–7.7)
Neutrophils Relative %: 76 %
Platelet Count: 250 K/uL (ref 150–400)
RBC: 3.68 MIL/uL — ABNORMAL LOW (ref 3.87–5.11)
RDW: 13.3 % (ref 11.5–15.5)
WBC Count: 12.9 K/uL — ABNORMAL HIGH (ref 4.0–10.5)
nRBC: 0 % (ref 0.0–0.2)

## 2024-07-13 LAB — CMP (CANCER CENTER ONLY)
ALT: 21 U/L (ref 0–44)
AST: 28 U/L (ref 15–41)
Albumin: 4 g/dL (ref 3.5–5.0)
Alkaline Phosphatase: 52 U/L (ref 38–126)
Anion gap: 9 (ref 5–15)
BUN: 10 mg/dL (ref 8–23)
CO2: 25 mmol/L (ref 22–32)
Calcium: 9 mg/dL (ref 8.9–10.3)
Chloride: 103 mmol/L (ref 98–111)
Creatinine: 0.62 mg/dL (ref 0.44–1.00)
GFR, Estimated: >60 mL/min (ref >60)
Glucose, Bld: 112 mg/dL — ABNORMAL HIGH (ref 70–99)
Potassium: 4.1 mmol/L (ref 3.5–5.1)
Sodium: 137 mmol/L (ref 135–145)
Total Bilirubin: 0.6 mg/dL (ref 0.0–1.2)
Total Protein: 6.3 g/dL — ABNORMAL LOW (ref 6.5–8.1)

## 2024-07-13 MED ORDER — SODIUM CHLORIDE 0.9 % IV SOLN: Freq: Once | INTRAVENOUS | Status: AC

## 2024-07-13 MED ORDER — DEXTROSE 5 % IV SOLN
70.0000 mg/m2 | Freq: Once | INTRAVENOUS | Status: AC
  Administered 2024-07-13: 120 mg via INTRAVENOUS
  Filled 2024-07-13: qty 60

## 2024-07-13 MED ORDER — SODIUM CHLORIDE 0.9 % IV SOLN
40.0000 mg | Freq: Once | INTRAVENOUS | Status: AC
  Administered 2024-07-13: 40 mg via INTRAVENOUS
  Filled 2024-07-13: qty 4

## 2024-07-13 NOTE — Patient Instructions (Signed)
 [facility] - A DEPT OF [place]  Discharge Instructions: Thank you for choosing [facility] to provide your oncology and hematology care.   If you have a lab appointment with the Cancer Center, please go directly to the Cancer Center and check in at the registration area.   Wear comfortable clothing and clothing appropriate for easy access to any Portacath or PICC line.   We strive to give you quality time with your provider. You may need to reschedule your appointment if you arrive late (15 or more minutes).  Arriving late affects you and other patients whose appointments are after yours.  Also, if you miss three or more appointments without notifying the office, you may be dismissed from the clinic at the provider's discretion.      For prescription refill requests, have your pharmacy contact our office and allow 72 hours for refills to be completed.    Today you received the following chemotherapy and/or immunotherapy agents: Carfilzomib  (Kyprolis )      To help prevent nausea and vomiting after your treatment, we encourage you to take your nausea medication as directed.  BELOW ARE SYMPTOMS THAT SHOULD BE REPORTED IMMEDIATELY: *FEVER GREATER THAN 100.4 F (38 C) OR HIGHER *CHILLS OR SWEATING *NAUSEA AND VOMITING THAT IS NOT CONTROLLED WITH YOUR NAUSEA MEDICATION *UNUSUAL SHORTNESS OF BREATH *UNUSUAL BRUISING OR BLEEDING *URINARY PROBLEMS (pain or burning when urinating, or frequent urination) *BOWEL PROBLEMS (unusual diarrhea, constipation, pain near the anus) TENDERNESS IN MOUTH AND THROAT WITH OR WITHOUT PRESENCE OF ULCERS (sore throat, sores in mouth, or a toothache) UNUSUAL RASH, SWELLING OR PAIN  UNUSUAL VAGINAL DISCHARGE OR ITCHING   Items with * indicate a potential emergency and should be followed up as soon as possible or go to the Emergency Department if any problems should occur.  Please show the CHEMOTHERAPY ALERT CARD or  IMMUNOTHERAPY ALERT CARD at check-in to the Emergency Department and triage nurse.  Should you have questions after your visit or need to cancel or reschedule your appointment, please contact [facility] - A DEPT OF [facility]  Dept: [phone]  and follow the prompts.  Office hours are 8:00 a.m. to 4:30 p.m. Monday - Friday. Please note that voicemails left after 4:00 p.m. may not be returned until the following business day.  We are closed weekends and major holidays. You have access to a nurse at all times for urgent questions. Please call the main number to the clinic Dept: [phone] and follow the prompts.   For any non-urgent questions, you may also contact your provider using MyChart. We now offer e-Visits for anyone 33 and older to request care online for non-urgent symptoms. For details visit [email or web address].   Also download the MyChart app! Go to the app store, search MyChart, open the app, select [place], and log in with your MyChart username and password.[facility] - A DEPT OF [place]  Discharge Instructions: Thank you for choosing [facility] to provide your oncology and hematology care.   If you have a lab appointment with the Cancer Center, please go directly to the Cancer Center and check in at the registration area.   Wear comfortable clothing and clothing appropriate for easy access to any Portacath or PICC line.   We strive to give you quality time with your provider. You may need to reschedule  your appointment if you arrive late (15 or more minutes).  Arriving late affects you and other patients whose appointments are after yours.  Also, if you miss three or more appointments without notifying the office, you may be dismissed from the clinic at the provider's discretion.      For prescription refill requests, have your pharmacy contact our office and allow 72 hours for refills  to be completed.    Today you received the following chemotherapy and/or immunotherapy agents: Carfilzomib  (Kyprolis )      To help prevent nausea and vomiting after your treatment, we encourage you to take your nausea medication as directed.  BELOW ARE SYMPTOMS THAT SHOULD BE REPORTED IMMEDIATELY: *FEVER GREATER THAN 100.4 F (38 C) OR HIGHER *CHILLS OR SWEATING *NAUSEA AND VOMITING THAT IS NOT CONTROLLED WITH YOUR NAUSEA MEDICATION *UNUSUAL SHORTNESS OF BREATH *UNUSUAL BRUISING OR BLEEDING *URINARY PROBLEMS (pain or burning when urinating, or frequent urination) *BOWEL PROBLEMS (unusual diarrhea, constipation, pain near the anus) TENDERNESS IN MOUTH AND THROAT WITH OR WITHOUT PRESENCE OF ULCERS (sore throat, sores in mouth, or a toothache) UNUSUAL RASH, SWELLING OR PAIN  UNUSUAL VAGINAL DISCHARGE OR ITCHING   Items with * indicate a potential emergency and should be followed up as soon as possible or go to the Emergency Department if any problems should occur.  Please show the CHEMOTHERAPY ALERT CARD or IMMUNOTHERAPY ALERT CARD at check-in to the Emergency Department and triage nurse.  Should you have questions after your visit or need to cancel or reschedule your appointment, please contact [facility] - A DEPT OF [facility]  Dept: [phone]  and follow the prompts.  Office hours are 8:00 a.m. to 4:30 p.m. Monday - Friday. Please note that voicemails left after 4:00 p.m. may not be returned until the following business day.  We are closed weekends and major holidays. You have access to a nurse at all times for urgent questions. Please call the main number to the clinic Dept: [phone] and follow the prompts.   For any non-urgent questions, you may also contact your provider using MyChart. We now offer e-Visits for anyone 50 and older to request care online for non-urgent symptoms. For details visit [email or web address].   Also download the  MyChart app! Go to the app store, search MyChart, open the app, select [place], and log in with your MyChart username and password.

## 2024-07-13 NOTE — Progress Notes (Signed)
 [facility] [phone]   [phone]  PROGRESS NOTE  Patient Care Team: Kip Righter, MD as PCP - General (Family Medicine)  CHIEF COMPLAINTS/PURPOSE OF CONSULTATION:  IgG Lambda Multiple Myeloma  ONCOLOGIC HISTORY: 07/27/2018-09/2018: Received weekly velcade  plus dexamethasone . Therapy changed due to poor response.  09/2018: Started Revlimid  25 mg for 21 days on, 7 days off of a 28 day cycle with dexamethasone . Therapy was held after 2 cycles because of severe dermatological toxicity 12/2018: Pomalyst  1 mg daily for 21 days.  She completed 4 cycles of therapy and therapy was held due to occurrence of dermatological toxicity.  07/2019: Started Ninlaro  4 mg weekly with dexamethasone  20 mg weekly for 3 weeks on and 1 week off. Daratumumab  was addd in April 2021. Ninlaro  was discontinued in November 2021. 05/18/2020: Started monthly daratumumab  and transitioned to q 3 month schedule in April 2022.  11/06/2022: Darzalex  discontinued due to progression of disease. M protein 0.9 with worsening lytic lesions in the skeleton.  11/20/2022: Cycle 1 Day 1 of Kyprolis /Dex therapy.  12/19/2022: Cycle 2 Day 1 of Kyprolis /Dex therapy 01/15/2023: Cycle 3 Day 1 of Kyprolis /Dex therapy 02/19/2023: Cycle 4 Day 1 of Kyprolis /Dex therapy 03/18/2023: Cycle 5 Day 1 of Kyprolis /Dex therapy 04/25/2023: Cycle 6 Day 1 of Kyprolis /Dex therapy 05/28/2023: Cycle 7 Day 1 of Kyprolis /Dex therapy 06/11/2023: Cycle 8 Day 1 of Kyprolis /Dex therapy 07/08/2023 Cycle 9 Day 1 of Kyprolis /Dex therapy 09/11/2023 Cycle 10 Day 1 of Kyprolis /Dex therapy 10/09/2023: Cycle 11 Day 1 of Kyprolis /Dex therapy 11/05/2023: Cycle 12 Day 1 of Kyprolis /Dex therapy 12/02/2023: Cycle 13 Day 1 of Kyprolis /Dex therapy 01/17/2024: Cycle 14 Day 1 of Kyprolis /Dex therapy 02/10/2024: Cycle 15 Day 1 of Kyprolis /Dex therapy 03/10/2024: Cycle 16 Day 1 of Kyprolis /Dex therapy 04/06/2024: Cycle 17 Day 1 of Kyprolis /Dex  therapy 05/04/2024: Cycle 18 Day 1 of Kyprolis /Dex therapy 06/01/2024: Cycle 19 Day 1 of Kyprolis /Dex therapy 06/29/2024: Cycle 20 Day 1 of Kyprolis /Dex therapy  HISTORY OF PRESENTING ILLNESS:  Nicole Keith 83 y.o. female returns for a follow up for IgG lambda multiple myeloma. She was last seen on 06/29/2024. She presents today for Cycle 20, Day 15 of Kyprolis /Dex.   On exam today, Nicole Keith reports she has been well overall in the interim since our last visit.  She reports her appetite remains great and her energy is strong.  She reports that she is doing her best to maintain a strong positive attitude.  She notes that she eats many beets and eats them like somebody to snack.  She notes that she does have some persistent diarrhea but no nausea or vomiting.  She reports that Pepto-Bismol does help to control this.  She reports that she continues to have her skin troubles and is applying the steroid cream as prescribed.  She reports that overall she feels like her chemotherapy is going well.  She otherwise denies any fevers, chills, sweats, runny nose, sore throat, cough.  A full 10 point ROS is otherwise negative.   MEDICAL HISTORY:  Past Medical History:  Diagnosis Date   Allergic rhinitis    Arthritis    Colitis, collagenous    Diverticular disease    severe sig tics/fixation 2012   Family hx of colon cancer    Fracture of femoral neck, right (HCC) 01/09/2013   GERD (gastroesophageal reflux disease)    exacerbation of reflux-like symptoms 04/2011   Hiatal hernia    Hyperlipidemia    muliplt myelom 2020   Osteoarthritis of right  hip 01/10/2013    SURGICAL HISTORY: Past Surgical History:  Procedure Laterality Date   CHOLECYSTECTOMY     EYE SURGERY  feb 2016   cataract right eye   LEG SURGERY Left    TONSILLECTOMY     TOTAL HIP ARTHROPLASTY Right 01/10/2013   Procedure: TOTAL HIP ARTHROPLASTY;  Surgeon: Fonda SHAUNNA Olmsted, MD;  Location: [facility] OR;  Service: Orthopedics;   Laterality: Right;    SOCIAL HISTORY: Social History   Socioeconomic History   Marital status: Married    Spouse name: Not on file   Number of children: 0   Years of education: Not on file   Highest education level: Some college, no degree  Occupational History   Occupation: retired  Tobacco Use   Smoking status: Former    Current packs/day: 0.00    Types: Cigarettes    Quit date: 01/10/1972    Years since quitting: 52.5   Smokeless tobacco: Never   Tobacco comments:    quit 1973  Vaping Use   Vaping status: Never Used  Substance and Sexual Activity   Alcohol use: No   Drug use: No   Sexual activity: Not on file  Other Topics Concern   Not on file  Social History Narrative   Married, no children. Drinks 2 cups decaf coffee a day. Walks daily. Is active, plays golf, she and her husband go hiking frequently. 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   Social Drivers of Corporate Investment Banker Strain: Not on file  Food Insecurity: No Food Insecurity (01/17/2023)   Hunger Vital Sign    Worried About Running Out of Food in the Last Year: Never true    Ran Out of Food in the Last Year: Never true  Transportation Needs: No Transportation Needs (01/17/2023)   PRAPARE - Administrator, Civil Service (Medical): No    Lack of Transportation (Non-Medical): No  Physical Activity: Not on file  Stress: Not on file  Social Connections: Not on file  Intimate Partner Violence: Not At Risk (01/17/2023)   Humiliation, Afraid, Rape, and Kick questionnaire    Fear of Current or Ex-Partner: No    Emotionally Abused: No    Physically Abused: No    Sexually Abused: No    FAMILY HISTORY: Family History  Problem Relation Age of Onset   Congestive Heart Failure Mother    Arthritis-Osteo Mother    Colon cancer Mother    Heart attack Father    Rheum arthritis Father    Colon cancer Maternal Aunt    Colon cancer Maternal Aunt    Hypothyroidism Sister      ALLERGIES:  is allergic to kyprolis  [carfilzomib ], pomalyst  [pomalidomide ], revlimid  [lenalidomide ], betadine [povidone iodine], flagyl [metronidazole], fosamax [alendronate sodium], bactrim [sulfamethoxazole-trimethoprim], clindamycin/lincomycin, doxepin hcl, hydrocodone -acetaminophen , hydroxyzine, hydroxyzine hcl, tramadol hcl, valium [diazepam], actonel [risedronate sodium], boniva [ibandronate], doxycycline, and penicillins.  MEDICATIONS:  Current Outpatient Medications  Medication Sig Dispense Refill   acetaminophen  (TYLENOL ) 500 MG tablet Take 1,000 mg by mouth See admin instructions. Take 1,000 mg by mouth after breakfast, at 3:30 PM, and at 8:30 PM     acyclovir  (ZOVIRAX ) 400 MG tablet Take 1 tablet (400 mg total) by mouth 2 (two) times daily. 60 tablet 3   albuterol  (VENTOLIN  HFA) 108 (90 Base) MCG/ACT inhaler SMARTSIG:1 Puff(s) By Mouth Every 4-6 Hours PRN     augmented betamethasone dipropionate (DIPROLENE-AF) 0.05 % cream SMARTSIG:sparingly Topical Twice Daily PRN     bismuth  subsalicylate (PEPTO BISMOL) 262 MG/15ML suspension Take 30 mLs by mouth every 6 (six) hours as needed.     famotidine  (PEPCID ) 20 MG tablet Take 1 tablet (20 mg total) by mouth daily. 30 tablet 0   famotidine -calcium carbonate-magnesium hydroxide (PEPCID  COMPLETE) 10-800-165 MG chewable tablet Chew 1 tablet by mouth daily as needed (for heartburn or indigestion).      guaiFENesin  (MUCINEX ) 600 MG 12 hr tablet Take 1 tablet (600 mg total) by mouth 2 (two) times daily. 40 tablet 0   hydrocortisone 2.5 % cream Apply topically 2 (two) times daily.     loperamide  (IMODIUM ) 2 MG capsule Take 1 capsule (2 mg total) by mouth 3 (three) times daily between meals as needed for diarrhea or loose stools. 30 capsule 1   rosuvastatin (CRESTOR) 5 MG tablet Take 5 mg by mouth daily.     triamcinolone  cream (KENALOG ) 0.1 % Apply 1 Application topically 2 (two) times daily. (Patient taking differently: Apply 1 Application  topically 2 (two) times daily as needed (for allergic flares- affected areas).) 453.6 g 2   VASELINE PURE ULTRA WHITE ointment Apply 1 application  topically See admin instructions. Apply to the genitalia in the morning and at bedtime     No current facility-administered medications for this visit.    REVIEW OF SYSTEMS:   Constitutional: ( - ) fevers, ( - )  chills , ( - ) night sweats Eyes: ( - ) blurriness of vision, ( - ) double vision, ( - ) watery eyes Ears, nose, mouth, throat, and face: ( - ) mucositis, ( - ) sore throat Respiratory: ( - ) cough, ( - ) dyspnea, ( - ) wheezes Cardiovascular: ( - ) palpitation, ( - ) chest discomfort, ( - ) lower extremity swelling Gastrointestinal:  ( - ) nausea, ( - ) heartburn, ( - ) change in bowel habits Skin: ( +) abnormal skin rashes Lymphatics: ( - ) new lymphadenopathy, ( - ) easy bruising Neurological: ( - ) numbness, ( - ) tingling, ( - ) new weaknesses Behavioral/Psych: ( - ) mood change, ( - ) new changes  All other systems were reviewed with the patient and are negative.  PHYSICAL EXAMINATION: ECOG PERFORMANCE STATUS: 1 - Symptomatic but completely ambulatory  Vitals:   07/13/24 1116  BP: (!) 154/77  Pulse: 63  Resp: 13  Temp: (!) 97.4 F (36.3 C)  SpO2: 100%      Filed Weights   07/13/24 1116  Weight: 123 lb 12.8 oz (56.2 kg)     GENERAL: well appearing female in NAD  SKIN: skin color, texture, turgor are normal. Diffuse, flat erythematous rash involving bilateral forearms.  EYES: conjunctiva are pink and non-injected, sclera clear LUNGS: clear to auscultation and percussion with normal breathing effort HEART: regular rate & rhythm and no murmurs and no lower extremity edema Musculoskeletal: no cyanosis of digits and no clubbing  PSYCH: alert & oriented x 3, fluent speech NEURO: no focal motor/sensory deficits  LABORATORY DATA:  I have reviewed the data as listed    Latest Ref Rng & Units 07/13/2024   10:02 AM  07/06/2024   11:55 AM 06/29/2024   10:08 AM  CBC  WBC 4.0 - 10.5 K/uL 12.9  9.3  9.4   Hemoglobin 12.0 - 15.0 g/dL 87.6  87.7  87.4   Hematocrit 36.0 - 46.0 % 36.7  36.8  38.0   Platelets 150 - 400 K/uL 250  191  467  Latest Ref Rng & Units 07/13/2024   10:02 AM 07/06/2024   11:55 AM 06/29/2024   10:08 AM  CMP  Glucose 70 - 99 mg/dL 887  891  880   BUN 8 - 23 mg/dL 10  11  9    Creatinine 0.44 - 1.00 mg/dL 9.37  9.35  9.39   Sodium 135 - 145 mmol/L 137  137  137   Potassium 3.5 - 5.1 mmol/L 4.1  4.1  4.2   Chloride 98 - 111 mmol/L 103  102  103   CO2 22 - 32 mmol/L 25  26  25    Calcium 8.9 - 10.3 mg/dL 9.0  9.3  9.2   Total Protein 6.5 - 8.1 g/dL 6.3  6.3  6.1   Total Bilirubin 0.0 - 1.2 mg/dL 0.6  0.7  0.6   Alkaline Phos 38 - 126 U/L 52  57  63   AST 15 - 41 U/L 28  26  28    ALT 0 - 44 U/L 21  23  25      ASSESSMENT & PLAN Nicole Keith is a 83 y.o.female who presents to the clinic for follow up for multiple myeloma.  #IgG Lambda Multiple Myeloma: --Initially presented in April 2016 with M spike of 3.2 g/dL and IgG level of 5314. Underwent bone marrow biopsy from 11/20/2014 show 29% plasma cells. 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  --In December 2019 with rising M spike of 5.9 g/dL and worsening anemia, with Hgb 8.7, she met criteria for transformation of active multiple myeloma.  --From 07/27/2018-09/2018, received weekly velcade  plus dexamethasone . Therapy changed due to poor response.  --In 09/2018, started Revlimid  25 mg for 21 days on, 7 days off of a 28 day cycle with dexamethasone . Therapy was held after 2 cycles because of severe dermatological toxicity --In 12/2018, switched to Pomalyst  1 mg daily for 21 days.  She completed 4 cycles of therapy and therapy was held due to occurrence of dermatological toxicity.  --In 07/2019, switch to Ninlaro  4 mg weekly with dexamethasone  20 mg weekly for 3 weeks on and 1 week off. Daratumumab  was addd  in April 2021. Ninlaro  was discontinued in November 2021. --On 05/18/2020, started monthly daratumumab  and transitioned to q 3 month schedule in April 2022.  --On 11/20/2022, switch to Kyprolis /Dex due to rising M-protein and bone survey that showed worsening lytic lesions. --Switched to biweekly infusion of Kyprolis /Dex per patient request  as she wants to prioritize her quality of life PLAN:   --Due for cycle 20, Day 15 today. --Labs today show white blood cell count 12.9, Hgb 12.3, MCV 99.7, Plt 250, creatinine and calcium levels are normal.  --Most recent myeloma labs from 06/08/2024 showed M protein 0.8 --Proceed with treatment today without any dose modifications.  --RTC in 2 weeks for continuation of Kyprolis /Dex   #H/O near syncopal episodes:  --Suspect hypoglycemia as patient reports not eating the day of the episode.  --Encouraged patient to eat small, frequent meals   #Diffuse erythematous rash-stable --Managed by dermatology.  Patient prescribed 3 different steroid creams with instructions on how to use them which areas to apply. -- nystatin  powder for groin area.   #Supportive Care -- port not required but can be placed if requested.  -- zofran  8mg  q8H PRN and compazine  10mg  PO q6H for nausea -- acyclovir  400mg  PO BID for VCZ prophylaxis -- no pain medication required at this time.  -- discussed role for zometa  therapy in the setting  her lytic lesions. Explained zometa  is given q 12 weeks and requires a dental clearance.  After discussing this with the patient further today she knows she would like to hold on this for the time being as she is afraid of the side effects.  Strongly emphasized that she should reconsider as this will be helpful with her lytic bone lesions.  No orders of the defined types were placed in this encounter.  All questions were answered. The patient knows to call the clinic with any problems, questions or concerns.  I have spent a total of 30 minutes  minutes of face-to-face and non-face-to-face time, preparing to see the patient,  performing a medically appropriate examination, counseling and educating the patient, ordering medications/tests/procedures,documenting clinical information in the electronic health record, and care coordination.   Norleen IVAR Kidney, MD Department of Hematology/Oncology [facility] at [facility] Phone: [phone] Pager: [phone] Email: [email or web address]

## 2024-07-26 MED FILL — Dexamethasone Sodium Phosphate Inj 100 MG/10ML: INTRAMUSCULAR | Qty: 4 | Status: AC

## 2024-07-27 ENCOUNTER — Inpatient Hospital Stay

## 2024-07-27 VITALS — BP 145/71 | HR 68 | Temp 99.1°F | Resp 17 | Wt 125.8 lb

## 2024-07-27 DIAGNOSIS — C9000 Multiple myeloma not having achieved remission: Secondary | ICD-10-CM

## 2024-07-27 DIAGNOSIS — Z5112 Encounter for antineoplastic immunotherapy: Secondary | ICD-10-CM | POA: Diagnosis not present

## 2024-07-27 LAB — CBC WITH DIFFERENTIAL (CANCER CENTER ONLY)
Abs Immature Granulocytes: 0.04 K/uL (ref 0.00–0.07)
Basophils Absolute: 0.1 K/uL (ref 0.0–0.1)
Basophils Relative: 1 %
Eosinophils Absolute: 0.3 K/uL (ref 0.0–0.5)
Eosinophils Relative: 4 %
HCT: 36.2 % (ref 36.0–46.0)
Hemoglobin: 12.1 g/dL (ref 12.0–15.0)
Immature Granulocytes: 0 %
Lymphocytes Relative: 18 %
Lymphs Abs: 1.7 K/uL (ref 0.7–4.0)
MCH: 33.5 pg (ref 26.0–34.0)
MCHC: 33.4 g/dL (ref 30.0–36.0)
MCV: 100.3 fL — ABNORMAL HIGH (ref 80.0–100.0)
Monocytes Absolute: 1 K/uL (ref 0.1–1.0)
Monocytes Relative: 11 %
Neutro Abs: 5.9 K/uL (ref 1.7–7.7)
Neutrophils Relative %: 66 %
Platelet Count: 526 K/uL — ABNORMAL HIGH (ref 150–400)
RBC: 3.61 MIL/uL — ABNORMAL LOW (ref 3.87–5.11)
RDW: 12.7 % (ref 11.5–15.5)
WBC Count: 9 K/uL (ref 4.0–10.5)
nRBC: 0 % (ref 0.0–0.2)

## 2024-07-27 LAB — CMP (CANCER CENTER ONLY)
ALT: 21 U/L (ref 0–44)
AST: 29 U/L (ref 15–41)
Albumin: 4 g/dL (ref 3.5–5.0)
Alkaline Phosphatase: 57 U/L (ref 38–126)
Anion gap: 9 (ref 5–15)
BUN: 10 mg/dL (ref 8–23)
CO2: 26 mmol/L (ref 22–32)
Calcium: 9.4 mg/dL (ref 8.9–10.3)
Chloride: 102 mmol/L (ref 98–111)
Creatinine: 0.62 mg/dL (ref 0.44–1.00)
GFR, Estimated: >60 mL/min (ref >60)
Glucose, Bld: 82 mg/dL (ref 70–99)
Potassium: 4 mmol/L (ref 3.5–5.1)
Sodium: 137 mmol/L (ref 135–145)
Total Bilirubin: 0.5 mg/dL (ref 0.0–1.2)
Total Protein: 6.5 g/dL (ref 6.5–8.1)

## 2024-07-27 MED ORDER — SODIUM CHLORIDE 0.9 % IV SOLN: Freq: Once | INTRAVENOUS | Status: AC

## 2024-07-27 MED ORDER — SODIUM CHLORIDE 0.9 % IV SOLN
40.0000 mg | Freq: Once | INTRAVENOUS | Status: AC
  Administered 2024-07-27: 15:00:00 40 mg via INTRAVENOUS
  Filled 2024-07-27: qty 40

## 2024-07-27 MED ORDER — DEXTROSE 5 % IV SOLN
70.0000 mg/m2 | Freq: Once | INTRAVENOUS | Status: AC
  Administered 2024-07-27: 16:00:00 120 mg via INTRAVENOUS
  Filled 2024-07-27: qty 60

## 2024-07-27 NOTE — Patient Instructions (Signed)
 [facility] A DEPT OF [facility]  Discharge Instructions: Thank you for choosing [facility] to provide your oncology and hematology care.   If you have a lab appointment with the Cancer Center, please go directly to the Cancer Center and check in at the registration area.   Wear comfortable clothing and clothing appropriate for easy access to any Portacath or PICC line.   We strive to give you quality time with your provider. You may need to reschedule your appointment if you arrive late (15 or more minutes).  Arriving late affects you and other patients whose appointments are after yours.  Also, if you miss three or more appointments without notifying the office, you may be dismissed from the clinic at the provider's discretion.      For prescription refill requests, have your pharmacy contact our office and allow 72 hours for refills to be completed.    Today you received the following chemotherapy and/or immunotherapy agents: carfilzomib (KYPROLIS)      To help prevent nausea and vomiting after your treatment, we encourage you to take your nausea medication as directed.  BELOW ARE SYMPTOMS THAT SHOULD BE REPORTED IMMEDIATELY: *FEVER GREATER THAN 100.4 F (38 C) OR HIGHER *CHILLS OR SWEATING *NAUSEA AND VOMITING THAT IS NOT CONTROLLED WITH YOUR NAUSEA MEDICATION *UNUSUAL SHORTNESS OF BREATH *UNUSUAL BRUISING OR BLEEDING *URINARY PROBLEMS (pain or burning when urinating, or frequent urination) *BOWEL PROBLEMS (unusual diarrhea, constipation, pain near the anus) TENDERNESS IN MOUTH AND THROAT WITH OR WITHOUT PRESENCE OF ULCERS (sore throat, sores in mouth, or a toothache) UNUSUAL RASH, SWELLING OR PAIN  UNUSUAL VAGINAL DISCHARGE OR ITCHING   Items with * indicate a potential emergency and should be followed up as soon as possible or go to the Emergency Department if any problems should occur.  Please show the CHEMOTHERAPY ALERT CARD or  IMMUNOTHERAPY ALERT CARD at check-in to the Emergency Department and triage nurse.  Should you have questions after your visit or need to cancel or reschedule your appointment, please contact [facility]  Dept: [phone]  and follow the prompts.  Office hours are 8:00 a.m. to 4:30 p.m. Monday - Friday. Please note that voicemails left after 4:00 p.m. may not be returned until the following business day.  We are closed weekends and major holidays. You have access to a nurse at all times for urgent questions. Please call the main number to the clinic Dept: [phone] and follow the prompts.   For any non-urgent questions, you may also contact your provider using MyChart. We now offer e-Visits for anyone 62 and older to request care online for non-urgent symptoms. For details visit [email or web address].   Also download the MyChart app! Go to the app store, search "MyChart", open the app, select [place], and log in with your MyChart username and password.

## 2024-07-28 LAB — KAPPA/LAMBDA LIGHT CHAINS
Kappa free light chain: 1.3 mg/L — ABNORMAL LOW (ref 3.3–19.4)
Kappa, lambda light chain ratio: 0.03 — ABNORMAL LOW (ref 0.26–1.65)
Lambda free light chains: 41.5 mg/L — ABNORMAL HIGH (ref 5.7–26.3)

## 2024-07-30 LAB — MULTIPLE MYELOMA PANEL, SERUM
Albumin SerPl Elph-Mcnc: 3.5 g/dL (ref 2.9–4.4)
Albumin/Glob SerPl: 1.3 (ref 0.7–1.7)
Alpha 1: 0.3 g/dL (ref 0.0–0.4)
Alpha2 Glob SerPl Elph-Mcnc: 0.6 g/dL (ref 0.4–1.0)
B-Globulin SerPl Elph-Mcnc: 0.8 g/dL (ref 0.7–1.3)
Gamma Glob SerPl Elph-Mcnc: 1 g/dL (ref 0.4–1.8)
Globulin, Total: 2.7 g/dL (ref 2.2–3.9)
IgA: 7 mg/dL — ABNORMAL LOW (ref 64–422)
IgG (Immunoglobin G), Serum: 1100 mg/dL (ref 586–1602)
IgM (Immunoglobulin M), Srm: <5 mg/dL — ABNORMAL LOW (ref 26–217)
M Protein SerPl Elph-Mcnc: 0.9 g/dL — ABNORMAL HIGH
Total Protein ELP: 6.2 g/dL (ref 6.0–8.5)

## 2024-08-03 ENCOUNTER — Inpatient Hospital Stay

## 2024-08-10 ENCOUNTER — Inpatient Hospital Stay

## 2024-08-10 ENCOUNTER — Inpatient Hospital Stay: Admitting: Hematology and Oncology

## 2024-08-16 MED FILL — Dexamethasone Sodium Phosphate Inj 100 MG/10ML: INTRAMUSCULAR | Qty: 4 | Status: AC

## 2024-08-16 NOTE — Progress Notes (Unsigned)
 " [facility] [phone]   [phone]  PROGRESS NOTE  Patient Care Team: Kip Righter, MD as PCP - General (Family Medicine)  CHIEF COMPLAINTS/PURPOSE OF CONSULTATION:  IgG Lambda Multiple Myeloma  ONCOLOGIC HISTORY: 07/27/2018-09/2018: Received weekly velcade  plus dexamethasone . Therapy changed due to poor response.  09/2018: Started Revlimid  25 mg for 21 days on, 7 days off of a 28 day cycle with dexamethasone . Therapy was held after 2 cycles because of severe dermatological toxicity 12/2018: Pomalyst  1 mg daily for 21 days.  She completed 4 cycles of therapy and therapy was held due to occurrence of dermatological toxicity.  07/2019: Started Ninlaro  4 mg weekly with dexamethasone  20 mg weekly for 3 weeks on and 1 week off. Daratumumab  was addd in April 2021. Ninlaro  was discontinued in November 2021. 05/18/2020: Started monthly daratumumab  and transitioned to q 3 month schedule in April 2022.  11/06/2022: Darzalex  discontinued due to progression of disease. M protein 0.9 with worsening lytic lesions in the skeleton.  11/20/2022: Cycle 1 Day 1 of Kyprolis /Dex therapy.  12/19/2022: Cycle 2 Day 1 of Kyprolis /Dex therapy 01/15/2023: Cycle 3 Day 1 of Kyprolis /Dex therapy 02/19/2023: Cycle 4 Day 1 of Kyprolis /Dex therapy 03/18/2023: Cycle 5 Day 1 of Kyprolis /Dex therapy 04/25/2023: Cycle 6 Day 1 of Kyprolis /Dex therapy 05/28/2023: Cycle 7 Day 1 of Kyprolis /Dex therapy 06/11/2023: Cycle 8 Day 1 of Kyprolis /Dex therapy 07/08/2023 Cycle 9 Day 1 of Kyprolis /Dex therapy 09/11/2023 Cycle 10 Day 1 of Kyprolis /Dex therapy 10/09/2023: Cycle 11 Day 1 of Kyprolis /Dex therapy 11/05/2023: Cycle 12 Day 1 of Kyprolis /Dex therapy 12/02/2023: Cycle 13 Day 1 of Kyprolis /Dex therapy 01/17/2024: Cycle 14 Day 1 of Kyprolis /Dex therapy 02/10/2024: Cycle 15 Day 1 of Kyprolis /Dex therapy 03/10/2024: Cycle 16 Day 1 of Kyprolis /Dex therapy 04/06/2024: Cycle 17 Day 1 of Kyprolis /Dex  therapy 05/04/2024: Cycle 18 Day 1 of Kyprolis /Dex therapy 06/01/2024: Cycle 19 Day 1 of Kyprolis /Dex therapy 06/29/2024: Cycle 20 Day 1 of Kyprolis /Dex therapy 07/27/2024:  Cycle 21 Day 1 of Kyprolis /Dex therapy 08/17/2024:  Cycle 22 Day 1 of Kyprolis /Dex therapy  HISTORY OF PRESENTING ILLNESS:  Nicole Keith 84 y.o. female returns for a follow up for IgG lambda multiple myeloma. She was last seen on  07/13/2024. She presents today for Cycle 22, Day 1 of Kyprolis /Dex.   On exam today, [name] reports her energy levels are overall stable.  She is having a difficult time recovering from weekly treatment which impacts her quality of life.  Her appetite is unchanged.  She denies nausea, vomiting or abdominal pain.  She does have occasional episodes of diarrhea which is managed with Pepto-Bismol as needed.  She denies easy bruising or overt signs of bleeding.  She denies fevers, chills, night sweats, shortness of breath, chest pain or cough.  She has no other complaints.  Rest of 10 point ROS as below.  MEDICAL HISTORY:  Past Medical History:  Diagnosis Date   Allergic rhinitis    Arthritis    Colitis, collagenous    Diverticular disease    severe sig tics/fixation 2012   Family hx of colon cancer    Fracture of femoral neck, right (HCC) 01/09/2013   GERD (gastroesophageal reflux disease)    exacerbation of reflux-like symptoms 04/2011   Hiatal hernia    Hyperlipidemia    muliplt myelom 2020   Osteoarthritis of right hip 01/10/2013    SURGICAL HISTORY: Past Surgical History:  Procedure Laterality Date   CHOLECYSTECTOMY     EYE SURGERY  feb  2016   cataract right eye   LEG SURGERY Left    TONSILLECTOMY     TOTAL HIP ARTHROPLASTY Right 01/10/2013   Procedure: TOTAL HIP ARTHROPLASTY;  Surgeon: Fonda SHAUNNA Olmsted, MD;  Location: [facility] OR;  Service: Orthopedics;  Laterality: Right;    SOCIAL HISTORY: Social History   Socioeconomic History   Marital status: Married    Spouse name: Not  on file   Number of children: 0   Years of education: Not on file   Highest education level: Some college, no degree  Occupational History   Occupation: retired  Tobacco Use   Smoking status: Former    Current packs/day: 0.00    Types: Cigarettes    Quit date: 01/10/1972    Years since quitting: 52.6   Smokeless tobacco: Never   Tobacco comments:    quit 1973  Vaping Use   Vaping status: Never Used  Substance and Sexual Activity   Alcohol use: No   Drug use: No   Sexual activity: Not on file  Other Topics Concern   Not on file  Social History Narrative   Married, no children. Drinks 2 cups decaf coffee a day. Walks daily. Is active, plays golf, she and her husband go hiking frequently. 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   Social Drivers of Health   Tobacco Use: Medium Risk (12/09/2023)   Received from [facility]   Patient History    Smoking Tobacco Use: Former    Smokeless Tobacco Use: Never    Passive Exposure: Not on file  Financial Resource Strain: Not on file  Food Insecurity: No Food Insecurity (01/17/2023)   Hunger Vital Sign    Worried About Running Out of Food in the Last Year: Never true    Ran Out of Food in the Last Year: Never true  Transportation Needs: No Transportation Needs (01/17/2023)   PRAPARE - Administrator, Civil Service (Medical): No    Lack of Transportation (Non-Medical): No  Physical Activity: Not on file  Stress: Not on file  Social Connections: Not on file  Intimate Partner Violence: Not At Risk (01/17/2023)   Humiliation, Afraid, Rape, and Kick questionnaire    Fear of Current or Ex-Partner: No    Emotionally Abused: No    Physically Abused: No    Sexually Abused: No  Depression (PHQ2-9): Low Risk (07/27/2024)   Depression (PHQ2-9)    PHQ-2 Score: 0  Alcohol Screen: Not on file  Housing: Low Risk (01/17/2023)   Housing    Last Housing Risk Score: 0  Utilities: Not At Risk (01/17/2023)   A[employer] Utilities     Threatened with loss of utilities: No  Health Literacy: Not on file    FAMILY HISTORY: Family History  Problem Relation Age of Onset   Congestive Heart Failure Mother    Arthritis-Osteo Mother    Colon cancer Mother    Heart attack Father    Rheum arthritis Father    Colon cancer Maternal Aunt    Colon cancer Maternal Aunt    Hypothyroidism Sister     ALLERGIES:  is allergic to kyprolis  [carfilzomib ], pomalyst  [pomalidomide ], revlimid  [lenalidomide ], betadine [povidone iodine], flagyl [metronidazole], fosamax [alendronate sodium], bactrim [sulfamethoxazole-trimethoprim], clindamycin/lincomycin, doxepin hcl, hydrocodone -acetaminophen , hydroxyzine, hydroxyzine hcl, tramadol hcl, valium [diazepam], actonel [risedronate sodium], boniva [ibandronate], doxycycline, and penicillins.  MEDICATIONS:  Current Outpatient Medications  Medication Sig Dispense Refill   acetaminophen  (TYLENOL ) 500 MG tablet Take 1,000 mg by mouth See admin instructions. Take  1,000 mg by mouth after breakfast, at 3:30 PM, and at 8:30 PM     acyclovir  (ZOVIRAX ) 400 MG tablet Take 1 tablet (400 mg total) by mouth 2 (two) times daily. 60 tablet 3   albuterol  (VENTOLIN  HFA) 108 (90 Base) MCG/ACT inhaler SMARTSIG:1 Puff(s) By Mouth Every 4-6 Hours PRN     augmented betamethasone dipropionate (DIPROLENE-AF) 0.05 % cream SMARTSIG:sparingly Topical Twice Daily PRN     bismuth subsalicylate (PEPTO BISMOL) 262 MG/15ML suspension Take 30 mLs by mouth every 6 (six) hours as needed.     famotidine  (PEPCID ) 20 MG tablet Take 1 tablet (20 mg total) by mouth daily. 30 tablet 0   famotidine -calcium carbonate-magnesium hydroxide (PEPCID  COMPLETE) 10-800-165 MG chewable tablet Chew 1 tablet by mouth daily as needed (for heartburn or indigestion).      guaiFENesin  (MUCINEX ) 600 MG 12 hr tablet Take 1 tablet (600 mg total) by mouth 2 (two) times daily. 40 tablet 0   hydrocortisone 2.5 % cream Apply topically 2 (two) times daily.      loperamide  (IMODIUM ) 2 MG capsule Take 1 capsule (2 mg total) by mouth 3 (three) times daily between meals as needed for diarrhea or loose stools. 30 capsule 1   rosuvastatin (CRESTOR) 5 MG tablet Take 5 mg by mouth daily.     triamcinolone  cream (KENALOG ) 0.1 % Apply 1 Application topically 2 (two) times daily. (Patient taking differently: Apply 1 Application topically 2 (two) times daily as needed (for allergic flares- affected areas).) 453.6 g 2   VASELINE PURE ULTRA WHITE ointment Apply 1 application  topically See admin instructions. Apply to the genitalia in the morning and at bedtime     No current facility-administered medications for this visit.    REVIEW OF SYSTEMS:   Constitutional: ( - ) fevers, ( - )  chills , ( - ) night sweats Eyes: ( - ) blurriness of vision, ( - ) double vision, ( - ) watery eyes Ears, nose, mouth, throat, and face: ( - ) mucositis, ( - ) sore throat Respiratory: ( - ) cough, ( - ) dyspnea, ( - ) wheezes Cardiovascular: ( - ) palpitation, ( - ) chest discomfort, ( - ) lower extremity swelling Gastrointestinal:  ( - ) nausea, ( - ) heartburn, ( - ) change in bowel habits Skin: ( +) abnormal skin rashes Lymphatics: ( - ) new lymphadenopathy, ( - ) easy bruising Neurological: ( - ) numbness, ( - ) tingling, ( - ) new weaknesses Behavioral/Psych: ( - ) mood change, ( - ) new changes  All other systems were reviewed with the patient and are negative.  PHYSICAL EXAMINATION: ECOG PERFORMANCE STATUS: 1 - Symptomatic but completely ambulatory  Vitals:   08/17/24 1109  BP: (!) 150/71  Pulse: 64  Resp: 17  Temp: 98.5 F (36.9 C)  SpO2: 100%   Filed Weights   08/17/24 1109  Weight: 126 lb 3.2 oz (57.2 kg)   GENERAL: well appearing female in NAD  SKIN: skin color, texture, turgor are normal. Diffuse, flat erythematous rash involving bilateral forearms.  EYES: conjunctiva are pink and non-injected, sclera clear LUNGS: clear to auscultation and percussion  with normal breathing effort HEART: regular rate & rhythm and no murmurs and no lower extremity edema Musculoskeletal: no cyanosis of digits and no clubbing  PSYCH: alert & oriented x 3, fluent speech NEURO: no focal motor/sensory deficits  LABORATORY DATA:  I have reviewed the data as listed    Latest Ref  Rng & Units 08/17/2024   10:19 AM 07/27/2024    1:10 PM 07/13/2024   10:02 AM  CBC  WBC 4.0 - 10.5 K/uL 7.0  9.0  12.9   Hemoglobin 12.0 - 15.0 g/dL 87.3  87.8  87.6   Hematocrit 36.0 - 46.0 % 37.5  36.2  36.7   Platelets 150 - 400 K/uL 371  526  250        Latest Ref Rng & Units 08/17/2024   10:19 AM 07/27/2024    1:10 PM 07/13/2024   10:02 AM  CMP  Glucose 70 - 99 mg/dL 888  82  887   BUN 8 - 23 mg/dL 11  10  10    Creatinine 0.44 - 1.00 mg/dL 9.30  9.37  9.37   Sodium 135 - 145 mmol/L 138  137  137   Potassium 3.5 - 5.1 mmol/L 4.2  4.0  4.1   Chloride 98 - 111 mmol/L 102  102  103   CO2 22 - 32 mmol/L 27  26  25    Calcium 8.9 - 10.3 mg/dL 9.5  9.4  9.0   Total Protein 6.5 - 8.1 g/dL 6.7  6.5  6.3   Total Bilirubin 0.0 - 1.2 mg/dL 0.5  0.5  0.6   Alkaline Phos 38 - 126 U/L 55  57  52   AST 15 - 41 U/L 32  29  28   ALT 0 - 44 U/L 19  21  21      ASSESSMENT & PLAN Nicole Keith is a 84 y.o.female who presents to the clinic for follow up for multiple myeloma.  #IgG Lambda Multiple Myeloma: --Initially presented in April 2016 with M spike of 3.2 g/dL and IgG level of 5314. Underwent bone marrow biopsy from 11/20/2014 show 29% plasma cells. 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  --In December 2019 with rising M spike of 5.9 g/dL and worsening anemia, with Hgb 8.7, she met criteria for transformation of active multiple myeloma.  --From 07/27/2018-09/2018, received weekly velcade  plus dexamethasone . Therapy changed due to poor response.  --In 09/2018, started Revlimid  25 mg for 21 days on, 7 days off of a 28 day cycle with dexamethasone . Therapy was held  after 2 cycles because of severe dermatological toxicity --In 12/2018, switched to Pomalyst  1 mg daily for 21 days.  She completed 4 cycles of therapy and therapy was held due to occurrence of dermatological toxicity.  --In 07/2019, switch to Ninlaro  4 mg weekly with dexamethasone  20 mg weekly for 3 weeks on and 1 week off. Daratumumab  was addd in April 2021. Ninlaro  was discontinued in November 2021. --On 05/18/2020, started monthly daratumumab  and transitioned to q 3 month schedule in April 2022.  --On 11/20/2022, switch to Kyprolis /Dex due to rising M-protein and bone survey that showed worsening lytic lesions. --Switched to biweekly infusion of Kyprolis /Dex per patient request  as she wants to prioritize her quality of life PLAN:   --Due for cycle 22, Day 1 today. --Labs today show WBC 7.0, hemoglobin 12.6, platelets 371, creatinine and LFTs are normal. --Most recent myeloma labs from 07/27/2024 showed M protein 0.9, lambda light chain overall stable at 41.5. --Proceed with treatment today without any dose modifications. --Patient requested to cancel her appointments next week due to ongoing fatigue and  difficulty with recovery from weekly treatments.  --RTC in 2 weeks for continuation of Kyprolis /Dex   #H/O near syncopal episodes:  --Suspect hypoglycemia as patient reports not eating the day  of the episode.  --Encouraged patient to eat small, frequent meals   #Diffuse erythematous rash-stable --Managed by dermatology.  Patient prescribed 3 different steroid creams with instructions on how to use them which areas to apply. -- nystatin  powder for groin area.   #Supportive Care -- port not required but can be placed if requested.  -- zofran  8mg  q8H PRN and compazine  10mg  PO q6H for nausea -- acyclovir  400mg  PO BID for VCZ prophylaxis -- no pain medication required at this time.  -- discussed role for zometa  therapy in the setting her lytic lesions. Explained zometa  is given q 12 weeks  and requires a dental clearance.  After discussing this with the patient further today she knows she would like to hold on this for the time being as she is afraid of the side effects.  Strongly emphasized that she should reconsider as this will be helpful with her lytic bone lesions.  No orders of the defined types were placed in this encounter.  All questions were answered. The patient knows to call the clinic with any problems, questions or concerns.  I have spent a total of 30 minutes minutes of face-to-face and non-face-to-face time, preparing to see the patient,  performing a medically appropriate examination, counseling and educating the patient, ordering medications/tests/procedures,documenting clinical information in the electronic health record, and care coordination.   Johnston Police PA-C Dept of Hematology and Oncology [facility] Phone: [phone]    "

## 2024-08-17 ENCOUNTER — Inpatient Hospital Stay: Attending: Physician Assistant | Admitting: Physician Assistant

## 2024-08-17 ENCOUNTER — Inpatient Hospital Stay

## 2024-08-17 VITALS — BP 150/71 | HR 64 | Temp 98.5°F | Resp 17 | Ht 66.0 in | Wt 126.2 lb

## 2024-08-17 DIAGNOSIS — Z79899 Other long term (current) drug therapy: Secondary | ICD-10-CM | POA: Insufficient documentation

## 2024-08-17 DIAGNOSIS — C9000 Multiple myeloma not having achieved remission: Secondary | ICD-10-CM | POA: Diagnosis not present

## 2024-08-17 DIAGNOSIS — Z5112 Encounter for antineoplastic immunotherapy: Secondary | ICD-10-CM | POA: Diagnosis present

## 2024-08-17 LAB — CMP (CANCER CENTER ONLY)
ALT: 19 U/L (ref 0–44)
AST: 32 U/L (ref 15–41)
Albumin: 4.2 g/dL (ref 3.5–5.0)
Alkaline Phosphatase: 55 U/L (ref 38–126)
Anion gap: 8 (ref 5–15)
BUN: 11 mg/dL (ref 8–23)
CO2: 27 mmol/L (ref 22–32)
Calcium: 9.5 mg/dL (ref 8.9–10.3)
Chloride: 102 mmol/L (ref 98–111)
Creatinine: 0.69 mg/dL (ref 0.44–1.00)
GFR, Estimated: >60 mL/min
Glucose, Bld: 111 mg/dL — ABNORMAL HIGH (ref 70–99)
Potassium: 4.2 mmol/L (ref 3.5–5.1)
Sodium: 138 mmol/L (ref 135–145)
Total Bilirubin: 0.5 mg/dL (ref 0.0–1.2)
Total Protein: 6.7 g/dL (ref 6.5–8.1)

## 2024-08-17 LAB — CBC WITH DIFFERENTIAL (CANCER CENTER ONLY)
Abs Immature Granulocytes: 0.01 K/uL (ref 0.00–0.07)
Basophils Absolute: 0.1 K/uL (ref 0.0–0.1)
Basophils Relative: 1 %
Eosinophils Absolute: 0.1 K/uL (ref 0.0–0.5)
Eosinophils Relative: 1 %
HCT: 37.5 % (ref 36.0–46.0)
Hemoglobin: 12.6 g/dL (ref 12.0–15.0)
Immature Granulocytes: 0 %
Lymphocytes Relative: 18 %
Lymphs Abs: 1.2 K/uL (ref 0.7–4.0)
MCH: 33.2 pg (ref 26.0–34.0)
MCHC: 33.6 g/dL (ref 30.0–36.0)
MCV: 98.9 fL (ref 80.0–100.0)
Monocytes Absolute: 0.8 K/uL (ref 0.1–1.0)
Monocytes Relative: 11 %
Neutro Abs: 4.8 K/uL (ref 1.7–7.7)
Neutrophils Relative %: 69 %
Platelet Count: 371 K/uL (ref 150–400)
RBC: 3.79 MIL/uL — ABNORMAL LOW (ref 3.87–5.11)
RDW: 12.4 % (ref 11.5–15.5)
WBC Count: 7 K/uL (ref 4.0–10.5)
nRBC: 0 % (ref 0.0–0.2)

## 2024-08-17 MED ORDER — SODIUM CHLORIDE 0.9 % IV SOLN
40.0000 mg | Freq: Once | INTRAVENOUS | Status: AC
  Administered 2024-08-17: 40 mg via INTRAVENOUS
  Filled 2024-08-17: qty 40

## 2024-08-17 MED ORDER — SODIUM CHLORIDE 0.9 % IV SOLN: Freq: Once | INTRAVENOUS | Status: AC

## 2024-08-17 MED ORDER — DEXTROSE 5 % IV SOLN
70.0000 mg/m2 | Freq: Once | INTRAVENOUS | Status: AC
  Administered 2024-08-17: 120 mg via INTRAVENOUS
  Filled 2024-08-17: qty 60

## 2024-08-17 NOTE — Patient Instructions (Signed)
 [facility] A DEPT OF [facility]  Discharge Instructions: Thank you for choosing [facility] to provide your oncology and hematology care.   If you have a lab appointment with the Cancer Center, please go directly to the Cancer Center and check in at the registration area.   Wear comfortable clothing and clothing appropriate for easy access to any Portacath or PICC line.   We strive to give you quality time with your provider. You may need to reschedule your appointment if you arrive late (15 or more minutes).  Arriving late affects you and other patients whose appointments are after yours.  Also, if you miss three or more appointments without notifying the office, you may be dismissed from the clinic at the provider's discretion.      For prescription refill requests, have your pharmacy contact our office and allow 72 hours for refills to be completed.    Today you received the following chemotherapy and/or immunotherapy agents: carfilzomib (KYPROLIS)      To help prevent nausea and vomiting after your treatment, we encourage you to take your nausea medication as directed.  BELOW ARE SYMPTOMS THAT SHOULD BE REPORTED IMMEDIATELY: *FEVER GREATER THAN 100.4 F (38 C) OR HIGHER *CHILLS OR SWEATING *NAUSEA AND VOMITING THAT IS NOT CONTROLLED WITH YOUR NAUSEA MEDICATION *UNUSUAL SHORTNESS OF BREATH *UNUSUAL BRUISING OR BLEEDING *URINARY PROBLEMS (pain or burning when urinating, or frequent urination) *BOWEL PROBLEMS (unusual diarrhea, constipation, pain near the anus) TENDERNESS IN MOUTH AND THROAT WITH OR WITHOUT PRESENCE OF ULCERS (sore throat, sores in mouth, or a toothache) UNUSUAL RASH, SWELLING OR PAIN  UNUSUAL VAGINAL DISCHARGE OR ITCHING   Items with * indicate a potential emergency and should be followed up as soon as possible or go to the Emergency Department if any problems should occur.  Please show the CHEMOTHERAPY ALERT CARD or  IMMUNOTHERAPY ALERT CARD at check-in to the Emergency Department and triage nurse.  Should you have questions after your visit or need to cancel or reschedule your appointment, please contact [facility]  Dept: [phone]  and follow the prompts.  Office hours are 8:00 a.m. to 4:30 p.m. Monday - Friday. Please note that voicemails left after 4:00 p.m. may not be returned until the following business day.  We are closed weekends and major holidays. You have access to a nurse at all times for urgent questions. Please call the main number to the clinic Dept: [phone] and follow the prompts.   For any non-urgent questions, you may also contact your provider using MyChart. We now offer e-Visits for anyone 62 and older to request care online for non-urgent symptoms. For details visit [email or web address].   Also download the MyChart app! Go to the app store, search "MyChart", open the app, select [place], and log in with your MyChart username and password.

## 2024-08-18 LAB — KAPPA/LAMBDA LIGHT CHAINS
Kappa free light chain: 1.1 mg/L — ABNORMAL LOW (ref 3.3–19.4)
Kappa, lambda light chain ratio: 0.02 — ABNORMAL LOW (ref 0.26–1.65)
Lambda free light chains: 49 mg/L — ABNORMAL HIGH (ref 5.7–26.3)

## 2024-08-19 LAB — MULTIPLE MYELOMA PANEL, SERUM
Albumin SerPl Elph-Mcnc: 3.3 g/dL (ref 2.9–4.4)
Albumin/Glob SerPl: 1.3 (ref 0.7–1.7)
Alpha 1: 0.2 g/dL (ref 0.0–0.4)
Alpha2 Glob SerPl Elph-Mcnc: 0.5 g/dL (ref 0.4–1.0)
B-Globulin SerPl Elph-Mcnc: 0.8 g/dL (ref 0.7–1.3)
Gamma Glob SerPl Elph-Mcnc: 1.1 g/dL (ref 0.4–1.8)
Globulin, Total: 2.6 g/dL (ref 2.2–3.9)
IgA: 8 mg/dL — ABNORMAL LOW (ref 64–422)
IgG (Immunoglobin G), Serum: 1165 mg/dL (ref 586–1602)
IgM (Immunoglobulin M), Srm: 5 mg/dL — ABNORMAL LOW (ref 26–217)
M Protein SerPl Elph-Mcnc: 0.9 g/dL — ABNORMAL HIGH
Total Protein ELP: 5.9 g/dL — ABNORMAL LOW (ref 6.0–8.5)

## 2024-08-24 ENCOUNTER — Other Ambulatory Visit: Payer: Self-pay

## 2024-08-24 ENCOUNTER — Inpatient Hospital Stay

## 2024-08-24 ENCOUNTER — Inpatient Hospital Stay: Admitting: Physician Assistant

## 2024-08-30 MED FILL — Dexamethasone Sodium Phosphate Inj 100 MG/10ML: INTRAMUSCULAR | Qty: 4 | Status: AC

## 2024-08-30 NOTE — Progress Notes (Unsigned)
 " [facility] [phone]   [phone]  PROGRESS NOTE  Patient Care Team: Kip Righter, MD as PCP - General (Family Medicine)  CHIEF COMPLAINTS/PURPOSE OF CONSULTATION:  IgG Lambda Multiple Myeloma  ONCOLOGIC HISTORY: 07/27/2018-09/2018: Received weekly velcade  plus dexamethasone . Therapy changed due to poor response.  09/2018: Started Revlimid  25 mg for 21 days on, 7 days off of a 28 day cycle with dexamethasone . Therapy was held after 2 cycles because of severe dermatological toxicity 12/2018: Pomalyst  1 mg daily for 21 days.  She completed 4 cycles of therapy and therapy was held due to occurrence of dermatological toxicity.  07/2019: Started Ninlaro  4 mg weekly with dexamethasone  20 mg weekly for 3 weeks on and 1 week off. Daratumumab  was addd in April 2021. Ninlaro  was discontinued in November 2021. 05/18/2020: Started monthly daratumumab  and transitioned to q 3 month schedule in April 2022.  11/06/2022: Darzalex  discontinued due to progression of disease. M protein 0.9 with worsening lytic lesions in the skeleton.  11/20/2022: Cycle 1 Day 1 of Kyprolis /Dex therapy.  12/19/2022: Cycle 2 Day 1 of Kyprolis /Dex therapy 01/15/2023: Cycle 3 Day 1 of Kyprolis /Dex therapy 02/19/2023: Cycle 4 Day 1 of Kyprolis /Dex therapy 03/18/2023: Cycle 5 Day 1 of Kyprolis /Dex therapy 04/25/2023: Cycle 6 Day 1 of Kyprolis /Dex therapy 05/28/2023: Cycle 7 Day 1 of Kyprolis /Dex therapy 06/11/2023: Cycle 8 Day 1 of Kyprolis /Dex therapy 07/08/2023 Cycle 9 Day 1 of Kyprolis /Dex therapy 09/11/2023 Cycle 10 Day 1 of Kyprolis /Dex therapy 10/09/2023: Cycle 11 Day 1 of Kyprolis /Dex therapy 11/05/2023: Cycle 12 Day 1 of Kyprolis /Dex therapy 12/02/2023: Cycle 13 Day 1 of Kyprolis /Dex therapy 01/17/2024: Cycle 14 Day 1 of Kyprolis /Dex therapy 02/10/2024: Cycle 15 Day 1 of Kyprolis /Dex therapy 03/10/2024: Cycle 16 Day 1 of Kyprolis /Dex therapy 04/06/2024: Cycle 17 Day 1 of Kyprolis /Dex  therapy 05/04/2024: Cycle 18 Day 1 of Kyprolis /Dex therapy 06/01/2024: Cycle 19 Day 1 of Kyprolis /Dex therapy 06/29/2024: Cycle 20 Day 1 of Kyprolis /Dex therapy 07/27/2024:  Cycle 21 Day 1 of Kyprolis /Dex therapy 08/17/2024:  Cycle 22 Day 1 of Kyprolis /Dex therapy  HISTORY OF PRESENTING ILLNESS:  Nicole Keith 84 y.o. female returns for a follow up for IgG lambda multiple myeloma. She was last seen on  08/17/2024. She presents today for Cycle 22, Day 15 of Kyprolis /Dex.   On exam today, Nicole Keith reports she is doing well without any new or concerning symptoms. Her energy is overall stable. She is able to complete her baseline ADLs on her own. She denies nausea, vomiting or abdominal pain. She does have ongoing episodes of diarrhea after her treatment which is controlled with imodium  as needed. She denies easy bruising or overt signs of bleeding. She denies fevers, chills, sweats, shortness of breath, chest pain or cough. She has no other complaints. Rest of the ROS is below.   MEDICAL HISTORY:  Past Medical History:  Diagnosis Date   Allergic rhinitis    Arthritis    Colitis, collagenous    Diverticular disease    severe sig tics/fixation 2012   Family hx of colon cancer    Fracture of femoral neck, right (HCC) 01/09/2013   GERD (gastroesophageal reflux disease)    exacerbation of reflux-like symptoms 04/2011   Hiatal hernia    Hyperlipidemia    muliplt myelom 2020   Osteoarthritis of right hip 01/10/2013    SURGICAL HISTORY: Past Surgical History:  Procedure Laterality Date   CHOLECYSTECTOMY     EYE SURGERY  feb 2016   cataract right eye  LEG SURGERY Left    TONSILLECTOMY     TOTAL HIP ARTHROPLASTY Right 01/10/2013   Procedure: TOTAL HIP ARTHROPLASTY;  Surgeon: Fonda SHAUNNA Olmsted, MD;  Location: [facility] OR;  Service: Orthopedics;  Laterality: Right;    SOCIAL HISTORY: Social History   Socioeconomic History   Marital status: Married    Spouse name: Not on file   Number of  children: 0   Years of education: Not on file   Highest education level: Some college, no degree  Occupational History   Occupation: retired  Tobacco Use   Smoking status: Former    Current packs/day: 0.00    Types: Cigarettes    Quit date: 01/10/1972    Years since quitting: 52.6   Smokeless tobacco: Never   Tobacco comments:    quit 1973  Vaping Use   Vaping status: Never Used  Substance and Sexual Activity   Alcohol use: No   Drug use: No   Sexual activity: Not on file  Other Topics Concern   Not on file  Social History Narrative   Married, no children. Drinks 2 cups decaf coffee a day. Walks daily. Is active, plays golf, she and her husband go hiking frequently. 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   Social Drivers of Health   Tobacco Use: Medium Risk (12/09/2023)   Received from [facility]   Patient History    Smoking Tobacco Use: Former    Smokeless Tobacco Use: Never    Passive Exposure: Not on file  Financial Resource Strain: Not on file  Food Insecurity: No Food Insecurity (01/17/2023)   Hunger Vital Sign    Worried About Running Out of Food in the Last Year: Never true    Ran Out of Food in the Last Year: Never true  Transportation Needs: No Transportation Needs (01/17/2023)   PRAPARE - Administrator, Civil Service (Medical): No    Lack of Transportation (Non-Medical): No  Physical Activity: Not on file  Stress: Not on file  Social Connections: Not on file  Intimate Partner Violence: Not At Risk (01/17/2023)   Humiliation, Afraid, Rape, and Kick questionnaire    Fear of Current or Ex-Partner: No    Emotionally Abused: No    Physically Abused: No    Sexually Abused: No  Depression (PHQ2-9): Low Risk (07/27/2024)   Depression (PHQ2-9)    PHQ-2 Score: 0  Alcohol Screen: Not on file  Housing: Low Risk (01/17/2023)   Housing    Last Housing Risk Score: 0  Utilities: Not At Risk (01/17/2023)   AHC Utilities    Threatened with loss  of utilities: No  Health Literacy: Not on file    FAMILY HISTORY: Family History  Problem Relation Age of Onset   Congestive Heart Failure Mother    Arthritis-Osteo Mother    Colon cancer Mother    Heart attack Father    Rheum arthritis Father    Colon cancer Maternal Aunt    Colon cancer Maternal Aunt    Hypothyroidism Sister     ALLERGIES:  is allergic to kyprolis  [carfilzomib ], pomalyst  [pomalidomide ], revlimid  [lenalidomide ], betadine [povidone iodine], flagyl [metronidazole], fosamax [alendronate sodium], bactrim [sulfamethoxazole-trimethoprim], clindamycin/lincomycin, doxepin hcl, hydrocodone -acetaminophen , hydroxyzine, hydroxyzine hcl, tramadol hcl, valium [diazepam], actonel [risedronate sodium], boniva [ibandronate], doxycycline, and penicillins.  MEDICATIONS:  Current Outpatient Medications  Medication Sig Dispense Refill   acetaminophen  (TYLENOL ) 500 MG tablet Take 1,000 mg by mouth See admin instructions. Take 1,000 mg by mouth after breakfast, at 3:30  PM, and at 8:30 PM     acyclovir  (ZOVIRAX ) 400 MG tablet Take 1 tablet (400 mg total) by mouth 2 (two) times daily. 60 tablet 3   albuterol  (VENTOLIN  HFA) 108 (90 Base) MCG/ACT inhaler SMARTSIG:1 Puff(s) By Mouth Every 4-6 Hours PRN     augmented betamethasone dipropionate (DIPROLENE-AF) 0.05 % cream SMARTSIG:sparingly Topical Twice Daily PRN     bismuth subsalicylate (PEPTO BISMOL) 262 MG/15ML suspension Take 30 mLs by mouth every 6 (six) hours as needed.     famotidine  (PEPCID ) 20 MG tablet Take 1 tablet (20 mg total) by mouth daily. 30 tablet 0   famotidine -calcium carbonate-magnesium hydroxide (PEPCID  COMPLETE) 10-800-165 MG chewable tablet Chew 1 tablet by mouth daily as needed (for heartburn or indigestion).      guaiFENesin  (MUCINEX ) 600 MG 12 hr tablet Take 1 tablet (600 mg total) by mouth 2 (two) times daily. 40 tablet 0   hydrocortisone 2.5 % cream Apply topically 2 (two) times daily.     loperamide  (IMODIUM ) 2 MG  capsule Take 1 capsule (2 mg total) by mouth 3 (three) times daily between meals as needed for diarrhea or loose stools. 30 capsule 1   rosuvastatin (CRESTOR) 5 MG tablet Take 5 mg by mouth daily.     triamcinolone  cream (KENALOG ) 0.1 % Apply 1 Application topically 2 (two) times daily. (Patient taking differently: Apply 1 Application topically 2 (two) times daily as needed (for allergic flares- affected areas).) 453.6 g 2   VASELINE PURE ULTRA WHITE ointment Apply 1 application  topically See admin instructions. Apply to the genitalia in the morning and at bedtime     No current facility-administered medications for this visit.    REVIEW OF SYSTEMS:   Constitutional: ( - ) fevers, ( - )  chills , ( - ) night sweats Eyes: ( - ) blurriness of vision, ( - ) double vision, ( - ) watery eyes Ears, nose, mouth, throat, and face: ( - ) mucositis, ( - ) sore throat Respiratory: ( - ) cough, ( - ) dyspnea, ( - ) wheezes Cardiovascular: ( - ) palpitation, ( - ) chest discomfort, ( - ) lower extremity swelling Gastrointestinal:  ( - ) nausea, ( - ) heartburn, ( - ) change in bowel habits Skin: ( +) abnormal skin rashes Lymphatics: ( - ) new lymphadenopathy, ( - ) easy bruising Neurological: ( - ) numbness, ( - ) tingling, ( - ) new weaknesses Behavioral/Psych: ( - ) mood change, ( - ) new changes  All other systems were reviewed with the patient and are negative.  PHYSICAL EXAMINATION: ECOG PERFORMANCE STATUS: 1 - Symptomatic but completely ambulatory  Vitals:   08/31/24 1134 08/31/24 1140  BP: (!) 149/74 (!) 145/75  Pulse: 63   Resp: 18   Temp: (!) 97.5 F (36.4 C)     Filed Weights   08/31/24 1134  Weight: 124 lb 12.8 oz (56.6 kg)    GENERAL: well appearing female in NAD  SKIN: skin color, texture, turgor are normal. Diffuse, flat erythematous rash involving bilateral forearms.  EYES: conjunctiva are pink and non-injected, sclera clear LUNGS: clear to auscultation and percussion with  normal breathing effort HEART: regular rate & rhythm and no murmurs and no lower extremity edema Musculoskeletal: no cyanosis of digits and no clubbing  PSYCH: alert & oriented x 3, fluent speech NEURO: no focal motor/sensory deficits  LABORATORY DATA:  I have reviewed the data as listed    Latest Ref Rng &  Units 08/31/2024   10:54 AM 08/17/2024   10:19 AM 07/27/2024    1:10 PM  CBC  WBC 4.0 - 10.5 K/uL 9.4  7.0  9.0   Hemoglobin 12.0 - 15.0 g/dL 87.1  87.3  87.8   Hematocrit 36.0 - 46.0 % 38.4  37.5  36.2   Platelets 150 - 400 K/uL 428  371  526        Latest Ref Rng & Units 08/31/2024   10:54 AM 08/17/2024   10:19 AM 07/27/2024    1:10 PM  CMP  Glucose 70 - 99 mg/dL 885  888  82   BUN 8 - 23 mg/dL 11  11  10    Creatinine 0.44 - 1.00 mg/dL 9.35  9.30  9.37   Sodium 135 - 145 mmol/L 138  138  137   Potassium 3.5 - 5.1 mmol/L 3.8  4.2  4.0   Chloride 98 - 111 mmol/L 101  102  102   CO2 22 - 32 mmol/L 26  27  26    Calcium 8.9 - 10.3 mg/dL 9.4  9.5  9.4   Total Protein 6.5 - 8.1 g/dL 6.6  6.7  6.5   Total Bilirubin 0.0 - 1.2 mg/dL 0.4  0.5  0.5   Alkaline Phos 38 - 126 U/L 53  55  57   AST 15 - 41 U/L 24  32  29   ALT 0 - 44 U/L 15  19  21      ASSESSMENT & PLAN Nicole Keith is a 84 y.o.female who presents to the clinic for follow up for multiple myeloma.  #IgG Lambda Multiple Myeloma: --Initially presented in April 2016 with M spike of 3.2 g/dL and IgG level of 5314. Underwent bone marrow biopsy from 11/20/2014 show 29% plasma cells. 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  --In December 2019 with rising M spike of 5.9 g/dL and worsening anemia, with Hgb 8.7, she met criteria for transformation of active multiple myeloma.  --From 07/27/2018-09/2018, received weekly velcade  plus dexamethasone . Therapy changed due to poor response.  --In 09/2018, started Revlimid  25 mg for 21 days on, 7 days off of a 28 day cycle with dexamethasone . Therapy was held after 2  cycles because of severe dermatological toxicity --In 12/2018, switched to Pomalyst  1 mg daily for 21 days.  She completed 4 cycles of therapy and therapy was held due to occurrence of dermatological toxicity.  --In 07/2019, switch to Ninlaro  4 mg weekly with dexamethasone  20 mg weekly for 3 weeks on and 1 week off. Daratumumab  was addd in April 2021. Ninlaro  was discontinued in November 2021. --On 05/18/2020, started monthly daratumumab  and transitioned to q 3 month schedule in April 2022.  --On 11/20/2022, switch to Kyprolis /Dex due to rising M-protein and bone survey that showed worsening lytic lesions. --Switched to biweekly infusion of Kyprolis /Dex per patient request  as she wants to prioritize her quality of life PLAN:   --Due for cycle 22, Day 15 today. --Labs today show WBC 9.4, Hgb 12.8, Plt 428, creatinine and LFTs are normal.  --Most recent myeloma labs from 08/17/2024 showed M protein 0.9, lambda light chain increased to 49.0 --Proceed with treatment today without any dose modifications. --RTC in 2 weeks for continuation of Kyprolis /Dex   #Diffuse erythematous rash-stable --Managed by dermatology.  Patient prescribed 3 different steroid creams with instructions on how to use them which areas to apply. -- nystatin  powder for groin area.   #Supportive Care -- port not  required but can be placed if requested.  -- zofran  8mg  q8H PRN and compazine  10mg  PO q6H for nausea -- acyclovir  400mg  PO BID for VCZ prophylaxis -- no pain medication required at this time.  -- discussed role for zometa  therapy in the setting her lytic lesions. Explained zometa  is given q 12 weeks and requires a dental clearance.  After discussing this with the patient further today she knows she would like to hold on this for the time being as she is afraid of the side effects.  Strongly emphasized that she should reconsider as this will be helpful with her lytic bone lesions.  No orders of the defined types were  placed in this encounter.  All questions were answered. The patient knows to call the clinic with any problems, questions or concerns.  I have spent a total of 30 minutes minutes of face-to-face and non-face-to-face time, preparing to see the patient,  performing a medically appropriate examination, counseling and educating the patient, ordering medications/tests/procedures,documenting clinical information in the electronic health record, and care coordination.   Johnston Police PA-C Dept of Hematology and Oncology [facility] Phone: [phone]    "

## 2024-08-31 ENCOUNTER — Inpatient Hospital Stay

## 2024-08-31 ENCOUNTER — Inpatient Hospital Stay: Admitting: Physician Assistant

## 2024-08-31 VITALS — BP 145/75 | HR 63 | Temp 97.5°F | Resp 18 | Wt 124.8 lb

## 2024-08-31 DIAGNOSIS — C9000 Multiple myeloma not having achieved remission: Secondary | ICD-10-CM

## 2024-08-31 DIAGNOSIS — Z5112 Encounter for antineoplastic immunotherapy: Secondary | ICD-10-CM | POA: Diagnosis not present

## 2024-08-31 LAB — CMP (CANCER CENTER ONLY)
ALT: 15 U/L (ref 0–44)
AST: 24 U/L (ref 15–41)
Albumin: 3.9 g/dL (ref 3.5–5.0)
Alkaline Phosphatase: 53 U/L (ref 38–126)
Anion gap: 11 (ref 5–15)
BUN: 11 mg/dL (ref 8–23)
CO2: 26 mmol/L (ref 22–32)
Calcium: 9.4 mg/dL (ref 8.9–10.3)
Chloride: 101 mmol/L (ref 98–111)
Creatinine: 0.64 mg/dL (ref 0.44–1.00)
GFR, Estimated: >60 mL/min
Glucose, Bld: 114 mg/dL — ABNORMAL HIGH (ref 70–99)
Potassium: 3.8 mmol/L (ref 3.5–5.1)
Sodium: 138 mmol/L (ref 135–145)
Total Bilirubin: 0.4 mg/dL (ref 0.0–1.2)
Total Protein: 6.6 g/dL (ref 6.5–8.1)

## 2024-08-31 LAB — CBC WITH DIFFERENTIAL (CANCER CENTER ONLY)
Abs Immature Granulocytes: 0.03 K/uL (ref 0.00–0.07)
Basophils Absolute: 0.1 K/uL (ref 0.0–0.1)
Basophils Relative: 1 %
Eosinophils Absolute: 0.1 K/uL (ref 0.0–0.5)
Eosinophils Relative: 2 %
HCT: 38.4 % (ref 36.0–46.0)
Hemoglobin: 12.8 g/dL (ref 12.0–15.0)
Immature Granulocytes: 0 %
Lymphocytes Relative: 14 %
Lymphs Abs: 1.3 K/uL (ref 0.7–4.0)
MCH: 32.9 pg (ref 26.0–34.0)
MCHC: 33.3 g/dL (ref 30.0–36.0)
MCV: 98.7 fL (ref 80.0–100.0)
Monocytes Absolute: 0.8 K/uL (ref 0.1–1.0)
Monocytes Relative: 9 %
Neutro Abs: 7 K/uL (ref 1.7–7.7)
Neutrophils Relative %: 74 %
Platelet Count: 428 K/uL — ABNORMAL HIGH (ref 150–400)
RBC: 3.89 MIL/uL (ref 3.87–5.11)
RDW: 12.5 % (ref 11.5–15.5)
WBC Count: 9.4 K/uL (ref 4.0–10.5)
nRBC: 0 % (ref 0.0–0.2)

## 2024-08-31 MED ORDER — SODIUM CHLORIDE 0.9 % IV SOLN: Freq: Once | INTRAVENOUS | Status: AC

## 2024-08-31 MED ORDER — DEXTROSE 5 % IV SOLN
70.0000 mg/m2 | Freq: Once | INTRAVENOUS | Status: AC
  Administered 2024-08-31: 120 mg via INTRAVENOUS
  Filled 2024-08-31: qty 60

## 2024-08-31 MED ORDER — SODIUM CHLORIDE 0.9 % IV SOLN
40.0000 mg | Freq: Once | INTRAVENOUS | Status: AC
  Administered 2024-08-31: 40 mg via INTRAVENOUS
  Filled 2024-08-31: qty 40

## 2024-08-31 NOTE — Patient Instructions (Signed)
 [facility] A DEPT OF [facility]  Discharge Instructions: Thank you for choosing [facility] to provide your oncology and hematology care.   If you have a lab appointment with the Cancer Center, please go directly to the Cancer Center and check in at the registration area.   Wear comfortable clothing and clothing appropriate for easy access to any Portacath or PICC line.   We strive to give you quality time with your provider. You may need to reschedule your appointment if you arrive late (15 or more minutes).  Arriving late affects you and other patients whose appointments are after yours.  Also, if you miss three or more appointments without notifying the office, you may be dismissed from the clinic at the provider's discretion.      For prescription refill requests, have your pharmacy contact our office and allow 72 hours for refills to be completed.    Today you received the following chemotherapy and/or immunotherapy agents: carfilzomib (KYPROLIS)      To help prevent nausea and vomiting after your treatment, we encourage you to take your nausea medication as directed.  BELOW ARE SYMPTOMS THAT SHOULD BE REPORTED IMMEDIATELY: *FEVER GREATER THAN 100.4 F (38 C) OR HIGHER *CHILLS OR SWEATING *NAUSEA AND VOMITING THAT IS NOT CONTROLLED WITH YOUR NAUSEA MEDICATION *UNUSUAL SHORTNESS OF BREATH *UNUSUAL BRUISING OR BLEEDING *URINARY PROBLEMS (pain or burning when urinating, or frequent urination) *BOWEL PROBLEMS (unusual diarrhea, constipation, pain near the anus) TENDERNESS IN MOUTH AND THROAT WITH OR WITHOUT PRESENCE OF ULCERS (sore throat, sores in mouth, or a toothache) UNUSUAL RASH, SWELLING OR PAIN  UNUSUAL VAGINAL DISCHARGE OR ITCHING   Items with * indicate a potential emergency and should be followed up as soon as possible or go to the Emergency Department if any problems should occur.  Please show the CHEMOTHERAPY ALERT CARD or  IMMUNOTHERAPY ALERT CARD at check-in to the Emergency Department and triage nurse.  Should you have questions after your visit or need to cancel or reschedule your appointment, please contact [facility]  Dept: [phone]  and follow the prompts.  Office hours are 8:00 a.m. to 4:30 p.m. Monday - Friday. Please note that voicemails left after 4:00 p.m. may not be returned until the following business day.  We are closed weekends and major holidays. You have access to a nurse at all times for urgent questions. Please call the main number to the clinic Dept: [phone] and follow the prompts.   For any non-urgent questions, you may also contact your provider using MyChart. We now offer e-Visits for anyone 62 and older to request care online for non-urgent symptoms. For details visit [email or web address].   Also download the MyChart app! Go to the app store, search "MyChart", open the app, select [place], and log in with your MyChart username and password.

## 2024-09-01 ENCOUNTER — Other Ambulatory Visit: Payer: Self-pay

## 2024-09-07 ENCOUNTER — Inpatient Hospital Stay

## 2024-09-07 ENCOUNTER — Inpatient Hospital Stay: Admitting: Hematology and Oncology

## 2024-09-13 ENCOUNTER — Telehealth: Payer: Self-pay

## 2024-09-13 ENCOUNTER — Telehealth: Payer: Self-pay | Admitting: Hematology and Oncology

## 2024-09-13 NOTE — Telephone Encounter (Signed)
 Called pt to inquire about wanting to cancel her appts

## 2024-09-13 NOTE — Telephone Encounter (Signed)
 TC to pt and left message to please return call to Dr Dorsey's office to clarify more about her symptoms of not feeling well.   Pt had notified schedulers yesterday to cancel appt for tomorrow 09/14/24 for infusion due to not feeling well and weather.

## 2024-09-13 NOTE — Telephone Encounter (Signed)
 Called pt to give an update on appts

## 2024-09-14 ENCOUNTER — Inpatient Hospital Stay

## 2024-09-14 ENCOUNTER — Inpatient Hospital Stay: Admitting: Hematology and Oncology

## 2024-09-21 ENCOUNTER — Inpatient Hospital Stay: Attending: Physician Assistant

## 2024-09-21 ENCOUNTER — Inpatient Hospital Stay

## 2024-09-28 ENCOUNTER — Inpatient Hospital Stay: Admitting: Physician Assistant

## 2024-09-28 ENCOUNTER — Inpatient Hospital Stay

## 2024-10-12 ENCOUNTER — Inpatient Hospital Stay

## 2024-10-12 ENCOUNTER — Inpatient Hospital Stay: Admitting: Physician Assistant

## 2024-10-12 ENCOUNTER — Inpatient Hospital Stay: Attending: Physician Assistant

## 2024-10-19 ENCOUNTER — Inpatient Hospital Stay

## 2024-10-19 ENCOUNTER — Inpatient Hospital Stay: Admitting: Physician Assistant

## 2024-10-26 ENCOUNTER — Inpatient Hospital Stay

## 2024-10-26 ENCOUNTER — Inpatient Hospital Stay: Admitting: Hematology and Oncology
